# Patient Record
Sex: Female | Born: 1945 | Race: White | Hispanic: No | Smoking: Never smoker
Health system: Southern US, Community
[De-identification: ages and names within clinical notes are randomized; demographics above are authoritative.]

## PROBLEM LIST (undated history)

## (undated) DIAGNOSIS — K219 Gastro-esophageal reflux disease without esophagitis: Secondary | ICD-10-CM

## (undated) DIAGNOSIS — F319 Bipolar disorder, unspecified: Secondary | ICD-10-CM

## (undated) DIAGNOSIS — I10 Essential (primary) hypertension: Secondary | ICD-10-CM

## (undated) DIAGNOSIS — M549 Dorsalgia, unspecified: Secondary | ICD-10-CM

## (undated) DIAGNOSIS — E039 Hypothyroidism, unspecified: Secondary | ICD-10-CM

## (undated) DIAGNOSIS — T8859XA Other complications of anesthesia, initial encounter: Secondary | ICD-10-CM

## (undated) DIAGNOSIS — E785 Hyperlipidemia, unspecified: Secondary | ICD-10-CM

## (undated) DIAGNOSIS — I7121 Aneurysm of the ascending aorta, without rupture: Secondary | ICD-10-CM

## (undated) DIAGNOSIS — E079 Disorder of thyroid, unspecified: Secondary | ICD-10-CM

## (undated) DIAGNOSIS — M1990 Unspecified osteoarthritis, unspecified site: Secondary | ICD-10-CM

## (undated) DIAGNOSIS — T4145XA Adverse effect of unspecified anesthetic, initial encounter: Secondary | ICD-10-CM

## (undated) DIAGNOSIS — F329 Major depressive disorder, single episode, unspecified: Secondary | ICD-10-CM

## (undated) DIAGNOSIS — Z9981 Dependence on supplemental oxygen: Secondary | ICD-10-CM

## (undated) DIAGNOSIS — F32A Depression, unspecified: Secondary | ICD-10-CM

## (undated) DIAGNOSIS — R51 Headache: Secondary | ICD-10-CM

## (undated) HISTORY — PX: NASAL SINUS SURGERY: SHX719

## (undated) HISTORY — PX: EYE SURGERY: SHX253

## (undated) HISTORY — PX: APPENDECTOMY: SHX54

## (undated) HISTORY — PX: GASTRIC BYPASS: SHX52

## (undated) HISTORY — PX: BACK SURGERY: SHX140

## (undated) HISTORY — DX: Aneurysm of the ascending aorta, without rupture: I71.21

## (undated) HISTORY — PX: TONSILLECTOMY: SUR1361

## (undated) HISTORY — PX: WRIST SURGERY: SHX841

---

## 1999-02-06 LAB — CONVERTED CEMR LAB

## 2000-03-12 ENCOUNTER — Ambulatory Visit (HOSPITAL_BASED_OUTPATIENT_CLINIC_OR_DEPARTMENT_OTHER): Admission: RE | Admit: 2000-03-12 | Discharge: 2000-03-13 | Payer: Self-pay | Admitting: Orthopedic Surgery

## 2000-10-22 ENCOUNTER — Ambulatory Visit (HOSPITAL_BASED_OUTPATIENT_CLINIC_OR_DEPARTMENT_OTHER): Admission: RE | Admit: 2000-10-22 | Discharge: 2000-10-22 | Payer: Self-pay | Admitting: Orthopedic Surgery

## 2000-10-23 ENCOUNTER — Emergency Department (HOSPITAL_COMMUNITY): Admission: EM | Admit: 2000-10-23 | Discharge: 2000-10-23 | Payer: Self-pay | Admitting: Emergency Medicine

## 2005-07-30 ENCOUNTER — Emergency Department (HOSPITAL_COMMUNITY): Admission: EM | Admit: 2005-07-30 | Discharge: 2005-07-30 | Payer: Self-pay | Admitting: Emergency Medicine

## 2005-08-04 ENCOUNTER — Emergency Department (HOSPITAL_COMMUNITY): Admission: EM | Admit: 2005-08-04 | Discharge: 2005-08-04 | Payer: Self-pay | Admitting: Advanced Practice Midwife

## 2005-08-19 ENCOUNTER — Emergency Department (HOSPITAL_COMMUNITY): Admission: EM | Admit: 2005-08-19 | Discharge: 2005-08-19 | Payer: Self-pay | Admitting: Emergency Medicine

## 2005-08-21 ENCOUNTER — Ambulatory Visit: Payer: Self-pay | Admitting: *Deleted

## 2005-08-21 ENCOUNTER — Inpatient Hospital Stay (HOSPITAL_COMMUNITY): Admission: EM | Admit: 2005-08-21 | Discharge: 2005-08-27 | Payer: Self-pay | Admitting: *Deleted

## 2005-08-23 ENCOUNTER — Encounter: Payer: Self-pay | Admitting: *Deleted

## 2005-09-26 ENCOUNTER — Emergency Department (HOSPITAL_COMMUNITY): Admission: EM | Admit: 2005-09-26 | Discharge: 2005-09-26 | Payer: Self-pay | Admitting: Emergency Medicine

## 2005-10-11 ENCOUNTER — Emergency Department (HOSPITAL_COMMUNITY): Admission: EM | Admit: 2005-10-11 | Discharge: 2005-10-11 | Payer: Self-pay | Admitting: Emergency Medicine

## 2005-10-11 ENCOUNTER — Encounter: Payer: Self-pay | Admitting: Vascular Surgery

## 2007-01-01 ENCOUNTER — Emergency Department (HOSPITAL_COMMUNITY): Admission: EM | Admit: 2007-01-01 | Discharge: 2007-01-02 | Payer: Self-pay | Admitting: Emergency Medicine

## 2007-04-08 ENCOUNTER — Emergency Department (HOSPITAL_COMMUNITY): Admission: EM | Admit: 2007-04-08 | Discharge: 2007-04-08 | Payer: Self-pay | Admitting: Emergency Medicine

## 2007-05-12 ENCOUNTER — Ambulatory Visit: Payer: Self-pay | Admitting: Nurse Practitioner

## 2007-05-12 DIAGNOSIS — F319 Bipolar disorder, unspecified: Secondary | ICD-10-CM | POA: Insufficient documentation

## 2007-05-12 DIAGNOSIS — M545 Low back pain, unspecified: Secondary | ICD-10-CM | POA: Insufficient documentation

## 2007-05-12 DIAGNOSIS — I10 Essential (primary) hypertension: Secondary | ICD-10-CM | POA: Insufficient documentation

## 2007-05-12 LAB — CONVERTED CEMR LAB
ALT: 16 units/L (ref 0–35)
AST: 23 units/L (ref 0–37)
Albumin: 4.4 g/dL (ref 3.5–5.2)
Alkaline Phosphatase: 120 units/L — ABNORMAL HIGH (ref 39–117)
BUN: 23 mg/dL (ref 6–23)
Basophils Absolute: 0 10*3/uL (ref 0.0–0.1)
Basophils Relative: 0 % (ref 0–1)
CO2: 27 meq/L (ref 19–32)
Calcium: 9.6 mg/dL (ref 8.4–10.5)
Chloride: 98 meq/L (ref 96–112)
Cholesterol: 249 mg/dL — ABNORMAL HIGH (ref 0–200)
Creatinine, Ser: 1.05 mg/dL (ref 0.40–1.20)
Eosinophils Absolute: 0.1 10*3/uL (ref 0.0–0.7)
Eosinophils Relative: 1 % (ref 0–5)
Glucose, Bld: 101 mg/dL — ABNORMAL HIGH (ref 70–99)
HCT: 44.2 % (ref 36.0–46.0)
HDL: 42 mg/dL (ref >39)
Hemoglobin: 14.1 g/dL (ref 12.0–15.0)
LDL Cholesterol: 180 mg/dL — ABNORMAL HIGH (ref 0–99)
Lymphocytes Relative: 28 % (ref 12–46)
Lymphs Abs: 2.3 10*3/uL (ref 0.7–4.0)
MCHC: 31.9 g/dL (ref 30.0–36.0)
MCV: 94.8 fL (ref 78.0–100.0)
Monocytes Absolute: 0.7 10*3/uL (ref 0.1–1.0)
Monocytes Relative: 9 % (ref 3–12)
Neutro Abs: 5.1 10*3/uL (ref 1.7–7.7)
Neutrophils Relative %: 62 % (ref 43–77)
Platelets: 272 10*3/uL (ref 150–400)
Potassium: 4.2 meq/L (ref 3.5–5.3)
RBC: 4.66 M/uL (ref 3.87–5.11)
RDW: 12.8 % (ref 11.5–15.5)
Sodium: 139 meq/L (ref 135–145)
TSH: 5.179 microintl units/mL (ref 0.350–5.50)
Total Bilirubin: 0.3 mg/dL (ref 0.3–1.2)
Total CHOL/HDL Ratio: 5.9
Total Protein: 7.1 g/dL (ref 6.0–8.3)
Triglycerides: 137 mg/dL (ref <150)
VLDL: 27 mg/dL (ref 0–40)
WBC: 8.3 10*3/uL (ref 4.0–10.5)

## 2007-05-13 ENCOUNTER — Encounter (INDEPENDENT_AMBULATORY_CARE_PROVIDER_SITE_OTHER): Payer: Self-pay | Admitting: Nurse Practitioner

## 2007-05-13 DIAGNOSIS — E7800 Pure hypercholesterolemia, unspecified: Secondary | ICD-10-CM | POA: Insufficient documentation

## 2007-05-28 ENCOUNTER — Telehealth (INDEPENDENT_AMBULATORY_CARE_PROVIDER_SITE_OTHER): Payer: Self-pay | Admitting: Nurse Practitioner

## 2007-06-04 ENCOUNTER — Telehealth (INDEPENDENT_AMBULATORY_CARE_PROVIDER_SITE_OTHER): Payer: Self-pay | Admitting: *Deleted

## 2007-07-02 ENCOUNTER — Encounter: Admission: RE | Admit: 2007-07-02 | Discharge: 2007-07-02 | Payer: Self-pay | Admitting: Family Medicine

## 2007-07-18 ENCOUNTER — Ambulatory Visit: Payer: Self-pay | Admitting: Nurse Practitioner

## 2007-07-18 DIAGNOSIS — E669 Obesity, unspecified: Secondary | ICD-10-CM | POA: Insufficient documentation

## 2007-08-18 ENCOUNTER — Encounter (INDEPENDENT_AMBULATORY_CARE_PROVIDER_SITE_OTHER): Payer: Self-pay | Admitting: Nurse Practitioner

## 2007-08-18 ENCOUNTER — Telehealth (INDEPENDENT_AMBULATORY_CARE_PROVIDER_SITE_OTHER): Payer: Self-pay | Admitting: Nurse Practitioner

## 2007-09-17 ENCOUNTER — Ambulatory Visit: Payer: Self-pay | Admitting: Nurse Practitioner

## 2007-09-25 ENCOUNTER — Ambulatory Visit: Payer: Self-pay | Admitting: Nurse Practitioner

## 2007-09-25 LAB — CONVERTED CEMR LAB
Cholesterol: 189 mg/dL (ref 0–200)
HDL: 47 mg/dL (ref >39)
LDL Cholesterol: 99 mg/dL (ref 0–99)
Total CHOL/HDL Ratio: 4
Triglycerides: 215 mg/dL — ABNORMAL HIGH (ref <150)
VLDL: 43 mg/dL — ABNORMAL HIGH (ref 0–40)

## 2007-09-29 ENCOUNTER — Encounter (INDEPENDENT_AMBULATORY_CARE_PROVIDER_SITE_OTHER): Payer: Self-pay | Admitting: Nurse Practitioner

## 2007-10-24 ENCOUNTER — Emergency Department (HOSPITAL_COMMUNITY): Admission: EM | Admit: 2007-10-24 | Discharge: 2007-10-24 | Payer: Self-pay | Admitting: Emergency Medicine

## 2007-11-07 ENCOUNTER — Encounter (INDEPENDENT_AMBULATORY_CARE_PROVIDER_SITE_OTHER): Payer: Self-pay | Admitting: Nurse Practitioner

## 2007-11-12 ENCOUNTER — Ambulatory Visit: Payer: Self-pay | Admitting: Nurse Practitioner

## 2007-11-12 DIAGNOSIS — L259 Unspecified contact dermatitis, unspecified cause: Secondary | ICD-10-CM | POA: Insufficient documentation

## 2007-11-18 ENCOUNTER — Encounter (INDEPENDENT_AMBULATORY_CARE_PROVIDER_SITE_OTHER): Payer: Self-pay | Admitting: Nurse Practitioner

## 2007-11-20 ENCOUNTER — Ambulatory Visit: Payer: Self-pay | Admitting: Surgery

## 2007-11-20 ENCOUNTER — Ambulatory Visit: Admission: RE | Admit: 2007-11-20 | Discharge: 2007-11-20 | Payer: Self-pay | Admitting: Family Medicine

## 2007-11-20 ENCOUNTER — Encounter (INDEPENDENT_AMBULATORY_CARE_PROVIDER_SITE_OTHER): Payer: Self-pay | Admitting: Ophthalmology

## 2007-11-26 ENCOUNTER — Telehealth (INDEPENDENT_AMBULATORY_CARE_PROVIDER_SITE_OTHER): Payer: Self-pay | Admitting: Nurse Practitioner

## 2008-01-20 ENCOUNTER — Inpatient Hospital Stay (HOSPITAL_COMMUNITY): Admission: RE | Admit: 2008-01-20 | Discharge: 2008-01-26 | Payer: Self-pay | Admitting: Neurosurgery

## 2008-03-09 ENCOUNTER — Encounter: Admission: RE | Admit: 2008-03-09 | Discharge: 2008-04-14 | Payer: Self-pay | Admitting: Neurosurgery

## 2008-05-20 ENCOUNTER — Ambulatory Visit: Payer: Self-pay | Admitting: Nurse Practitioner

## 2008-05-20 DIAGNOSIS — L679 Hair color and hair shaft abnormality, unspecified: Secondary | ICD-10-CM | POA: Insufficient documentation

## 2008-05-20 DIAGNOSIS — J309 Allergic rhinitis, unspecified: Secondary | ICD-10-CM | POA: Insufficient documentation

## 2008-05-20 DIAGNOSIS — L739 Follicular disorder, unspecified: Secondary | ICD-10-CM

## 2008-05-20 LAB — CONVERTED CEMR LAB
ALT: 12 units/L (ref 0–35)
AST: 19 units/L (ref 0–37)
Albumin: 4.2 g/dL (ref 3.5–5.2)
Alkaline Phosphatase: 114 units/L (ref 39–117)
BUN: 19 mg/dL (ref 6–23)
Basophils Absolute: 0 10*3/uL (ref 0.0–0.1)
Basophils Relative: 0 % (ref 0–1)
CO2: 27 meq/L (ref 19–32)
Calcium: 9.2 mg/dL (ref 8.4–10.5)
Chloride: 102 meq/L (ref 96–112)
Cholesterol, target level: 200 mg/dL
Cholesterol: 209 mg/dL — ABNORMAL HIGH (ref 0–200)
Creatinine, Ser: 0.84 mg/dL (ref 0.40–1.20)
Eosinophils Absolute: 0.1 10*3/uL (ref 0.0–0.7)
Eosinophils Relative: 1 % (ref 0–5)
Glucose, Bld: 90 mg/dL (ref 70–99)
HCT: 40.9 % (ref 36.0–46.0)
HDL goal, serum: 40 mg/dL
HDL: 45 mg/dL (ref >39)
Hemoglobin: 12.9 g/dL (ref 12.0–15.0)
LDL Cholesterol: 132 mg/dL — ABNORMAL HIGH (ref 0–99)
LDL Goal: 160 mg/dL
Lymphocytes Relative: 21 % (ref 12–46)
Lymphs Abs: 1.6 10*3/uL (ref 0.7–4.0)
MCHC: 31.5 g/dL (ref 30.0–36.0)
MCV: 92.5 fL (ref 78.0–100.0)
Monocytes Absolute: 0.6 10*3/uL (ref 0.1–1.0)
Monocytes Relative: 8 % (ref 3–12)
Neutro Abs: 5.1 10*3/uL (ref 1.7–7.7)
Neutrophils Relative %: 70 % (ref 43–77)
Platelets: 243 10*3/uL (ref 150–400)
Potassium: 4.5 meq/L (ref 3.5–5.3)
RBC: 4.42 M/uL (ref 3.87–5.11)
RDW: 16 % — ABNORMAL HIGH (ref 11.5–15.5)
Sodium: 142 meq/L (ref 135–145)
Total Bilirubin: 0.4 mg/dL (ref 0.3–1.2)
Total CHOL/HDL Ratio: 4.6
Total Protein: 6.8 g/dL (ref 6.0–8.3)
Triglycerides: 159 mg/dL — ABNORMAL HIGH (ref <150)
VLDL: 32 mg/dL (ref 0–40)
WBC: 7.4 10*3/uL (ref 4.0–10.5)

## 2008-05-21 ENCOUNTER — Encounter (INDEPENDENT_AMBULATORY_CARE_PROVIDER_SITE_OTHER): Payer: Self-pay | Admitting: Nurse Practitioner

## 2008-05-21 ENCOUNTER — Telehealth (INDEPENDENT_AMBULATORY_CARE_PROVIDER_SITE_OTHER): Payer: Self-pay | Admitting: Nurse Practitioner

## 2008-05-25 ENCOUNTER — Telehealth (INDEPENDENT_AMBULATORY_CARE_PROVIDER_SITE_OTHER): Payer: Self-pay | Admitting: Nurse Practitioner

## 2008-07-28 ENCOUNTER — Ambulatory Visit: Payer: Self-pay | Admitting: Nurse Practitioner

## 2008-07-29 ENCOUNTER — Encounter (INDEPENDENT_AMBULATORY_CARE_PROVIDER_SITE_OTHER): Payer: Self-pay | Admitting: Nurse Practitioner

## 2008-07-29 LAB — CONVERTED CEMR LAB
Basophils Absolute: 0 10*3/uL (ref 0.0–0.1)
Basophils Relative: 0 % (ref 0–1)
Eosinophils Absolute: 0.1 10*3/uL (ref 0.0–0.7)
Eosinophils Relative: 1 % (ref 0–5)
HCT: 43.8 % (ref 36.0–46.0)
Hemoglobin: 13.8 g/dL (ref 12.0–15.0)
Lymphocytes Relative: 22 % (ref 12–46)
Lymphs Abs: 2.5 10*3/uL (ref 0.7–4.0)
MCHC: 31.5 g/dL (ref 30.0–36.0)
MCV: 96.1 fL (ref 78.0–100.0)
Monocytes Absolute: 1.2 10*3/uL — ABNORMAL HIGH (ref 0.1–1.0)
Monocytes Relative: 10 % (ref 3–12)
Neutro Abs: 7.8 10*3/uL — ABNORMAL HIGH (ref 1.7–7.7)
Neutrophils Relative %: 67 % (ref 43–77)
Platelets: 301 10*3/uL (ref 150–400)
RBC: 4.56 M/uL (ref 3.87–5.11)
RDW: 14.8 % (ref 11.5–15.5)
Sed Rate: 6 mm/hr (ref 0–22)
WBC: 11.6 10*3/uL — ABNORMAL HIGH (ref 4.0–10.5)

## 2008-09-13 ENCOUNTER — Ambulatory Visit: Payer: Self-pay | Admitting: Nurse Practitioner

## 2008-09-13 DIAGNOSIS — K219 Gastro-esophageal reflux disease without esophagitis: Secondary | ICD-10-CM | POA: Insufficient documentation

## 2008-09-14 ENCOUNTER — Encounter (INDEPENDENT_AMBULATORY_CARE_PROVIDER_SITE_OTHER): Payer: Self-pay | Admitting: Nurse Practitioner

## 2008-09-14 LAB — CONVERTED CEMR LAB: TSH: 6.265 microintl units/mL — ABNORMAL HIGH (ref 0.350–4.500)

## 2008-09-27 ENCOUNTER — Ambulatory Visit: Payer: Self-pay | Admitting: Nurse Practitioner

## 2008-10-04 ENCOUNTER — Ambulatory Visit: Payer: Self-pay | Admitting: Nurse Practitioner

## 2008-10-25 ENCOUNTER — Ambulatory Visit: Payer: Self-pay | Admitting: Nurse Practitioner

## 2008-10-26 LAB — CONVERTED CEMR LAB: TSH: 7.376 microintl units/mL — ABNORMAL HIGH (ref 0.350–4.500)

## 2008-12-06 ENCOUNTER — Ambulatory Visit: Payer: Self-pay | Admitting: Nurse Practitioner

## 2008-12-06 DIAGNOSIS — T148XXA Other injury of unspecified body region, initial encounter: Secondary | ICD-10-CM | POA: Insufficient documentation

## 2008-12-06 LAB — CONVERTED CEMR LAB
Cholesterol, target level: 200 mg/dL
HDL goal, serum: 40 mg/dL
LDL Goal: 130 mg/dL

## 2008-12-07 ENCOUNTER — Encounter (INDEPENDENT_AMBULATORY_CARE_PROVIDER_SITE_OTHER): Payer: Self-pay | Admitting: Nurse Practitioner

## 2008-12-13 ENCOUNTER — Ambulatory Visit: Payer: Self-pay | Admitting: Nurse Practitioner

## 2008-12-13 DIAGNOSIS — H6690 Otitis media, unspecified, unspecified ear: Secondary | ICD-10-CM | POA: Insufficient documentation

## 2008-12-17 ENCOUNTER — Ambulatory Visit (HOSPITAL_COMMUNITY): Admission: RE | Admit: 2008-12-17 | Discharge: 2008-12-17 | Payer: Self-pay | Admitting: Internal Medicine

## 2008-12-22 ENCOUNTER — Ambulatory Visit: Payer: Self-pay | Admitting: Nurse Practitioner

## 2008-12-22 LAB — CONVERTED CEMR LAB
HCT: 48.2 % — ABNORMAL HIGH (ref 36.0–46.0)
Hemoglobin: 14.7 g/dL (ref 12.0–15.0)
MCHC: 30.5 g/dL (ref 30.0–36.0)
MCV: 103.7 fL — ABNORMAL HIGH (ref 78.0–100.0)
Platelets: 234 10*3/uL (ref 150–400)
RBC: 4.65 M/uL (ref 3.87–5.11)
RDW: 14 % (ref 11.5–15.5)
Retic Ct Pct: 1 % (ref 0.4–3.1)
WBC: 7.3 10*3/uL (ref 4.0–10.5)

## 2008-12-28 ENCOUNTER — Encounter (INDEPENDENT_AMBULATORY_CARE_PROVIDER_SITE_OTHER): Payer: Self-pay | Admitting: Nurse Practitioner

## 2009-01-21 ENCOUNTER — Telehealth (INDEPENDENT_AMBULATORY_CARE_PROVIDER_SITE_OTHER): Payer: Self-pay | Admitting: Nurse Practitioner

## 2009-03-11 ENCOUNTER — Ambulatory Visit: Payer: Self-pay | Admitting: Nurse Practitioner

## 2009-03-11 DIAGNOSIS — G43909 Migraine, unspecified, not intractable, without status migrainosus: Secondary | ICD-10-CM | POA: Insufficient documentation

## 2009-03-17 ENCOUNTER — Ambulatory Visit (HOSPITAL_COMMUNITY): Admission: RE | Admit: 2009-03-17 | Discharge: 2009-03-17 | Payer: Self-pay | Admitting: Internal Medicine

## 2009-03-18 ENCOUNTER — Telehealth (INDEPENDENT_AMBULATORY_CARE_PROVIDER_SITE_OTHER): Payer: Self-pay | Admitting: Nurse Practitioner

## 2009-03-28 ENCOUNTER — Telehealth (INDEPENDENT_AMBULATORY_CARE_PROVIDER_SITE_OTHER): Payer: Self-pay | Admitting: Nurse Practitioner

## 2009-03-29 ENCOUNTER — Ambulatory Visit: Payer: Self-pay | Admitting: Nurse Practitioner

## 2009-03-29 DIAGNOSIS — R059 Cough, unspecified: Secondary | ICD-10-CM | POA: Insufficient documentation

## 2009-03-29 DIAGNOSIS — R05 Cough: Secondary | ICD-10-CM | POA: Insufficient documentation

## 2009-03-29 LAB — CONVERTED CEMR LAB: Pro B Natriuretic peptide (BNP): 10.1 pg/mL (ref 0.0–100.0)

## 2009-03-31 ENCOUNTER — Ambulatory Visit (HOSPITAL_COMMUNITY): Admission: RE | Admit: 2009-03-31 | Discharge: 2009-03-31 | Payer: Self-pay | Admitting: Nurse Practitioner

## 2009-04-04 ENCOUNTER — Ambulatory Visit: Payer: Self-pay | Admitting: Nurse Practitioner

## 2009-05-02 ENCOUNTER — Ambulatory Visit: Payer: Self-pay | Admitting: Nurse Practitioner

## 2009-05-17 ENCOUNTER — Telehealth (INDEPENDENT_AMBULATORY_CARE_PROVIDER_SITE_OTHER): Payer: Self-pay | Admitting: Nurse Practitioner

## 2009-05-19 ENCOUNTER — Encounter (INDEPENDENT_AMBULATORY_CARE_PROVIDER_SITE_OTHER): Payer: Self-pay | Admitting: Nurse Practitioner

## 2009-07-05 ENCOUNTER — Ambulatory Visit: Payer: Self-pay | Admitting: Nurse Practitioner

## 2009-07-05 DIAGNOSIS — M25511 Pain in right shoulder: Secondary | ICD-10-CM | POA: Insufficient documentation

## 2009-08-03 ENCOUNTER — Telehealth (INDEPENDENT_AMBULATORY_CARE_PROVIDER_SITE_OTHER): Payer: Self-pay | Admitting: Nurse Practitioner

## 2009-08-05 ENCOUNTER — Telehealth (INDEPENDENT_AMBULATORY_CARE_PROVIDER_SITE_OTHER): Payer: Self-pay | Admitting: Nurse Practitioner

## 2009-09-23 ENCOUNTER — Other Ambulatory Visit: Admission: RE | Admit: 2009-09-23 | Discharge: 2009-09-23 | Payer: Self-pay | Admitting: Internal Medicine

## 2009-09-23 ENCOUNTER — Ambulatory Visit: Payer: Self-pay | Admitting: Nurse Practitioner

## 2009-09-23 DIAGNOSIS — L989 Disorder of the skin and subcutaneous tissue, unspecified: Secondary | ICD-10-CM | POA: Insufficient documentation

## 2009-09-25 ENCOUNTER — Encounter (INDEPENDENT_AMBULATORY_CARE_PROVIDER_SITE_OTHER): Payer: Self-pay | Admitting: Nurse Practitioner

## 2009-10-13 ENCOUNTER — Telehealth (INDEPENDENT_AMBULATORY_CARE_PROVIDER_SITE_OTHER): Payer: Self-pay | Admitting: Nurse Practitioner

## 2009-10-24 ENCOUNTER — Ambulatory Visit: Payer: Self-pay | Admitting: Nurse Practitioner

## 2009-10-31 ENCOUNTER — Encounter (INDEPENDENT_AMBULATORY_CARE_PROVIDER_SITE_OTHER): Payer: Self-pay | Admitting: Nurse Practitioner

## 2009-10-31 DIAGNOSIS — K5730 Diverticulosis of large intestine without perforation or abscess without bleeding: Secondary | ICD-10-CM | POA: Insufficient documentation

## 2009-11-12 ENCOUNTER — Emergency Department (HOSPITAL_COMMUNITY): Admission: EM | Admit: 2009-11-12 | Discharge: 2009-11-12 | Payer: Self-pay | Admitting: Emergency Medicine

## 2009-11-14 ENCOUNTER — Encounter (INDEPENDENT_AMBULATORY_CARE_PROVIDER_SITE_OTHER): Payer: Self-pay | Admitting: Nurse Practitioner

## 2009-11-22 ENCOUNTER — Ambulatory Visit: Payer: Self-pay | Admitting: Nurse Practitioner

## 2009-11-22 LAB — CONVERTED CEMR LAB
Cholesterol: 196 mg/dL (ref 0–200)
HDL: 45 mg/dL (ref >39)
LDL Cholesterol: 120 mg/dL — ABNORMAL HIGH (ref 0–99)
Total CHOL/HDL Ratio: 4.4
Triglycerides: 156 mg/dL — ABNORMAL HIGH (ref <150)
VLDL: 31 mg/dL (ref 0–40)

## 2009-11-23 ENCOUNTER — Encounter (INDEPENDENT_AMBULATORY_CARE_PROVIDER_SITE_OTHER): Payer: Self-pay | Admitting: Nurse Practitioner

## 2009-12-08 ENCOUNTER — Telehealth (INDEPENDENT_AMBULATORY_CARE_PROVIDER_SITE_OTHER): Payer: Self-pay | Admitting: *Deleted

## 2009-12-12 ENCOUNTER — Encounter (INDEPENDENT_AMBULATORY_CARE_PROVIDER_SITE_OTHER): Payer: Self-pay | Admitting: Nurse Practitioner

## 2009-12-13 ENCOUNTER — Ambulatory Visit (HOSPITAL_COMMUNITY)
Admission: RE | Admit: 2009-12-13 | Discharge: 2009-12-14 | Payer: Self-pay | Source: Home / Self Care | Admitting: Ophthalmology

## 2009-12-26 ENCOUNTER — Ambulatory Visit: Payer: Self-pay | Admitting: Nurse Practitioner

## 2010-01-02 ENCOUNTER — Telehealth (INDEPENDENT_AMBULATORY_CARE_PROVIDER_SITE_OTHER): Payer: Self-pay | Admitting: Nurse Practitioner

## 2010-01-05 ENCOUNTER — Ambulatory Visit (HOSPITAL_COMMUNITY)
Admission: RE | Admit: 2010-01-05 | Discharge: 2010-01-05 | Payer: Self-pay | Source: Home / Self Care | Admitting: Internal Medicine

## 2010-02-26 ENCOUNTER — Encounter: Payer: Self-pay | Admitting: Family Medicine

## 2010-03-01 ENCOUNTER — Ambulatory Visit: Admit: 2010-03-01 | Payer: Self-pay | Admitting: Nurse Practitioner

## 2010-03-05 LAB — CONVERTED CEMR LAB
ALT: 8 units/L (ref 0–35)
AST: 15 units/L (ref 0–37)
Albumin: 4 g/dL (ref 3.5–5.2)
Alkaline Phosphatase: 86 units/L (ref 39–117)
BUN: 14 mg/dL (ref 6–23)
Bilirubin Urine: NEGATIVE
CO2: 29 meq/L (ref 19–32)
Calcium: 9.2 mg/dL (ref 8.4–10.5)
Chloride: 100 meq/L (ref 96–112)
Cholesterol: 230 mg/dL — ABNORMAL HIGH (ref 0–200)
Creatinine, Ser: 1.1 mg/dL (ref 0.40–1.20)
Glucose, Bld: 82 mg/dL (ref 70–99)
Glucose, Urine, Semiquant: NEGATIVE
HDL: 49 mg/dL (ref >39)
KOH Prep: NEGATIVE
Ketones, urine, test strip: NEGATIVE
LDL Cholesterol: 155 mg/dL — ABNORMAL HIGH (ref 0–99)
Microalb, Ur: 0.84 mg/dL (ref 0.00–1.89)
Nitrite: NEGATIVE
Pap Smear: NEGATIVE
Potassium: 4.6 meq/L (ref 3.5–5.3)
Protein, U semiquant: NEGATIVE
Sodium: 141 meq/L (ref 135–145)
Specific Gravity, Urine: 1.02
Total Bilirubin: 0.4 mg/dL (ref 0.3–1.2)
Total CHOL/HDL Ratio: 4.7
Total Protein: 6.3 g/dL (ref 6.0–8.3)
Triglycerides: 129 mg/dL (ref <150)
Urobilinogen, UA: 0.2
VLDL: 26 mg/dL (ref 0–40)
pH: 5

## 2010-03-09 NOTE — Assessment & Plan Note (Signed)
Summary: BP NEXT WEEK//GK  Nurse Visit per [facility] said pt bp looks good and to keep appts that she has for lab in 'sept and return every three months............. Armenia Shannon  October 04, 2008 11:55 AM   Vital Signs:  Patient profile:   65 year old female Pulse rate:   94 / minute BP sitting:   112 / 74  (left arm) Cuff size:   large  Vitals Entered By: Armenia Shannon (October 04, 2008 11:48 AM)  Allergies: 1)  ! Duricef 2)  ! Neurontin  Orders Added: 1)  Est. Patient Level I [16109] Prescriptions: LISINOPRIL 10 MG TABS (LISINOPRIL) one tab by mouth daily for blood pressure  #30 x 5   Entered by:   Armenia Shannon   Authorized by:   Lehman Prom FNP   Signed by:   Armenia Shannon on 10/04/2008   Method used:   Print then Give to Patient   RxID:   [id number]

## 2010-03-09 NOTE — Medication Information (Signed)
Summary: [facility] //[facility] FAX FORM  [facility] //[facility] FAX FORM   Imported By: Arta Bruce 12/26/2009 14:32:12  _____________________________________________________________________  External Attachment:    Type:   Image     Comment:   External Document

## 2010-03-09 NOTE — Letter (Signed)
Summary: [facility] PROCEDURE REPORT  [facility] PROCEDURE REPORT   Imported By: Arta Bruce 11/22/2009 12:33:33  _____________________________________________________________________  External Attachment:    Type:   Image     Comment:   External Document

## 2010-03-09 NOTE — Letter (Signed)
Summary: TEST ORDER FORM/MAMMOGRAM  TEST ORDER FORM/MAMMOGRAM   Imported By: Arta Bruce 12/27/2009 15:55:27  _____________________________________________________________________  External Attachment:    Type:   Image     Comment:   External Document

## 2010-03-09 NOTE — Letter (Signed)
Summary: REFERRAL/DEMATOLOGY//APPT DATE & TIME  REFERRAL/DEMATOLOGY//APPT DATE & TIME   Imported By: Arta Bruce 09/23/2009 11:50:04  _____________________________________________________________________  External Attachment:    Type:   Image     Comment:   External Document

## 2010-03-09 NOTE — Assessment & Plan Note (Signed)
Summary: Complete Physical Exam   Vital Signs:  Patient profile:   65 year old female Menstrual status:  postmenopausal Weight:      266.0 pounds BMI:     46.55 Temp:     97.2 degrees F oral Pulse rate:   80 / minute Pulse rhythm:   regular Resp:     20 per minute BP sitting:   122 / 80  (left arm) Cuff size:   large  Vitals Entered By: Levon Hedger (September 23, 2009 9:31 AM)  Nutrition Counseling: Patient's BMI is greater than 25 and therefore counseled on weight management options. CC: CPP....pain in neck arms and knees...knot in right side of chest x 1 month, Lipid Management, Hypertension Management, Headache Pain Assessment Patient in pain? yes     Location: neck, knees  Does patient need assistance? Functional Status Self care Ambulation Normal   CC:  CPP....pain in neck arms and knees...knot in right side of chest x 1 month, Lipid Management, Hypertension Management, and Headache.  History of Present Illness:  Pt into the office for a complete physical exam.  PAP - last pap several years ago.   Post menopausal  - pt is not sexually active no family history of cervical cancer  Mammogram - done last 12/2008.  No family history of breast cancer  Optho - last eye exam 2 years ago.  She has an appt scheduled after 09/30/2009 because she had to wait 2 years.  Dental - Appt scheduled next week  Colonsopy - pt has never had a colonscopy. no family history of colon cancer  Diabetes Management History:      She says that she is exercising.  Type of exercise includes: walking.  Duration of exercise is estimated to be <30  She is doing this 3 times per week.    Headache HPI:      The headaches will last anywhere from 30 minutes to several days at a time.  She has approximately 5+ headaches per month.        The headaches are not associated with an aura.  The location of the headaches are bilateral.  Aggravating factors include head movement.        Additional  history: improved.    Headache Treatment History:      Ergots were tried but not effective.    Hypertension History:      She denies headache, chest pain, and palpitations.  She notes no problems with any antihypertensive medication side effects.  pt is taking meds as ordered.        Positive major cardiovascular risk factors include female age 60 years old or older, hyperlipidemia, and hypertension.  Negative major cardiovascular risk factors include no history of diabetes and non-tobacco-user status.        Further assessment for target organ damage reveals no history of ASHD, cardiac end-organ damage (CHF/LVH), stroke/TIA, peripheral vascular disease, renal insufficiency, or hypertensive retinopathy.    Lipid Management History:      Positive NCEP/ATP III risk factors include female age 60 years old or older and hypertension.  Negative NCEP/ATP III risk factors include no history of early menopause without estrogen hormone replacement, non-diabetic, non-tobacco-user status, no ASHD (atherosclerotic heart disease), no prior stroke/TIA, no peripheral vascular disease, and no history of aortic aneurysm.        The patient states that she knows about the "Therapeutic Lifestyle Change" diet.  Her compliance with the TLC diet is fair.  Adjunctive measures started by  the patient include weight reduction.  She expresses no side effects from her lipid-lowering medication.  The patient denies any symptoms to suggest myopathy or liver disease.       Habits & Providers  Alcohol-Tobacco-Diet     Alcohol drinks/day: <1     Alcohol type: wine     Tobacco Status: never  Exercise-Depression-Behavior     Does Patient Exercise: yes     Exercise Counseling: to improve exercise regimen     Type of exercise: walking     Exercise (avg: min/session): <30     Times/week: 3     Depression Counseling: not indicated; screening negative for depression     Drug Use: no     Seat Belt Use: 100     Sun Exposure:  occasionally  Comments: PHQ-9 = 13  Allergies (verified): 1)  ! Duricef 2)  ! Neurontin  Review of Systems General:  Obesity - weight is making her more and more depressed.  She does not want to get out and do things.  "I want to live again". Eyes:  Denies blurring. ENT:  Complains of earache; recurrent. CV:  Denies chest pain or discomfort. Resp:  Complains of chest discomfort; right chest - area of concern; present x 1 month- tender but no discoloration of overlying skin. GI:  Complains of constipation and diarrhea; intermittent diarrhea and constipation. GU:  Denies discharge. MS:  Complains of joint pain. Derm:  Complains of lesion(s); multiple crusted, erythematous lesions to skin that is on her face, arms and legs.  recurrent. She has used cream steriod and antibiotic cream as per this provider which improves some but then areas return.. Neuro:  Complains of headaches. Psych:  Denies anxiety and depression.  Physical Exam  General:  alert.  obese Head:  normocephalic.   Eyes:  vision grossly intact, pupils equal, and pupils round.   Ears:  bil erythema R>L Nose:  no nasal discharge.   Mouth:  pharynx pink and moist.  upper partial  Neck:  supple.   Chest Wall:  right chest - 1.5 cm nodule, tender - overlying skin intact Breasts:  skin/areolae normal, no masses, and no abnormal thickening.   Lungs:  normal breath sounds.   Heart:  normal rate and regular rhythm.   Abdomen:  soft, non-tender, and no distention.   Rectal:  external hemorrhoid(s).   Msk:  up to the exam table Extremities:  no edema Neurologic:  alert & oriented X3.   slow gait Psych:  Oriented X3.    Pelvic Exam  Vulva:      normal appearance.   Urethra and Bladder:      Urethra--normal.   Vagina:      post-menopausal.   Cervix:      midposition.   Uterus:      non-tender.   Adnexa:      nontender bilaterally.   Rectum:      heme negative stool, + external hemorrhoids.     Diabetic Foot  Exam Pulse Check          Right Foot          Left Foot Dorsalis Pedis:        normal            normal Comments: left great toenail - brittle, discoloration   Impression & Recommendations:  Problem # 1:  ROUTINE GYNECOLOGICAL EXAMINATION (ICD-V72.31) labs done   PAP done  guaiac negative pt to keep upcoming optho and dental appt  tdap up to date will refer pt for colonscopy Orders: Hemoccult Guaiac-1 spec.(in office) (82270) KOH/ WET Mount [phone]) Pap Smear, Thin Prep ( Collection of) (X9147)  Problem # 2:  HYPERTENSION, BENIGN ESSENTIAL (ICD-401.1)  Her updated medication list for this problem includes:    Lisinopril 10 Mg Tabs (Lisinopril) ..... One tab by mouth daily for blood pressure  Orders: UA Dipstick w/o Micro (manual) (82956) T-Urine Microalbumin w/creat. ratio [phone]) T-Comprehensive Metabolic Panel ([id number]) EKG w/ Interpretation (93000)  Problem # 3:  OBESITY (ICD-278.00) pt would like to consider lap band will read handouts and review with pt on next visit  Problem # 4:  MASS, CHEST WALL (ICD-786.6) of concern - will advise pt to apply warm compresses and if still present will need diagnostic test  Problem # 5:  SCREENING, COLON CANCER (ICD-V76.51) will refer Orders: Colonoscopy (Colon)  Problem # 6:  SKIN LESION (ICD-709.9) will refer to derm Orders: Dermatology Referral (Derma)  Complete Medication List: 1)  Citalopram Hydrobromide 20 Mg Tabs (Citalopram hydrobromide) .... One tablet by mouth daily **rx by greninger** 2)  Lamictal 200 Mg Tabs (Lamotrigine) .... Take one tablet daily *greninger,np 3)  Trazodone Hcl 100 Mg Tabs (Trazodone hcl) .... Take one tablet at bedtime as needed *greninger, np 4)  Levocetirizine Dihydrochloride 5 Mg Tabs (Levocetirizine dihydrochloride) .... One tablet by mouth daily for allergies 5)  Omeprazole 20 Mg Cpdr (Omeprazole) .... One capsule by mouth two times a day before breakfast and dinner 6)   Lisinopril 10 Mg Tabs (Lisinopril) .... One tab by mouth daily for blood pressure 7)  Levothyroxine Sodium 50 Mcg Tabs (Levothyroxine sodium) .... One tablet by mouth daily for thyroid 8)  Vicodin 5-500 Mg Tabs (Hydrocodone-acetaminophen) .... One tablet by mouth daily as needed for back 9)  Lac-hydrin 12 % Lotn (Ammonium lactate) .... One application topically two times a day as needed to affected area 10)  Sumatriptan Succinate 100 Mg Tabs (Sumatriptan succinate) .... One tablet by mouth at onset of headache, may repeat in 2 hours if headache persists 11)  Fluticasone Propionate 50 Mcg/act Susp (Fluticasone propionate) .... One spray in each nosril two times a day **hold head down** pharmacy - discontiue nasacort 12)  Mupirocin 2 % Oint (Mupirocin) .... Use to affected area two times a day until healed  Other Orders: T-Lipid Profile ([id number])  Diabetes Management Assessment/Plan:      The following lipid goals have been established for the patient: Total cholesterol goal of 200; LDL cholesterol goal of 130; HDL cholesterol goal of 40; Triglyceride goal of 150.  Her blood pressure goal is < 140/90.    Hypertension Assessment/Plan:      The patient's hypertensive risk group is category B: At least one risk factor (excluding diabetes) with no target organ damage.  Her calculated 10 year risk of coronary heart disease is 11 %.  Today's blood pressure is 122/80.  Her blood pressure goal is < 140/90.  Lipid Assessment/Plan:      Based on NCEP/ATP III, the patient's risk factor category is "2 or more risk factors and a calculated 10 year CAD risk of < 20%".  The patient's lipid goals are as follows: Total cholesterol goal is 200; LDL cholesterol goal is 130; HDL cholesterol goal is 40; Triglyceride goal is 150.    Patient Instructions: 1)  You will be referred for a colonscopy 2)  You will be referred to dermatology for skin lesions 3)  I will read literature and have an  update at your next  visit. 4)  Right chest - This is an area of concern.  Apply warm compresses to the area 5)  Allergies - prescription given for new medications 6)  Follow up in 2 weeks with n.martin to check area on chest. Prescriptions: SUMATRIPTAN SUCCINATE 100 MG TABS (SUMATRIPTAN SUCCINATE) One tablet by mouth at onset of headache, may repeat in 2 hours if headache persists  #10 x 0   Entered and Authorized by:   Lehman Prom FNP   Signed by:   Lehman Prom FNP on 09/23/2009   Method used:   Print then Give to Patient   RxID:   [id number] VICODIN 5-500 MG TABS (HYDROCODONE-ACETAMINOPHEN) One tablet by mouth daily as needed for back  #30 x 0   Entered and Authorized by:   Lehman Prom FNP   Signed by:   Lehman Prom FNP on 09/23/2009   Method used:   Print then Give to Patient   RxID:   [id number] LEVOCETIRIZINE DIHYDROCHLORIDE 5 MG TABS (LEVOCETIRIZINE DIHYDROCHLORIDE) One tablet by mouth daily for allergies  #30 x 5   Entered and Authorized by:   Lehman Prom FNP   Signed by:   Lehman Prom FNP on 09/23/2009   Method used:   Print then Give to Patient   RxID:   [id number]   Laboratory Results   Urine Tests  Date/Time Received: September 23, 2009 9:31 AM   Routine Urinalysis   Color: lt. yellow Appearance: Clear Glucose: negative   (Normal Range: Negative) Bilirubin: negative   (Normal Range: Negative) Ketone: negative   (Normal Range: Negative) Spec. Gravity: 1.020   (Normal Range: 1.003-1.035) Blood: trace-lysed   (Normal Range: Negative) pH: 5.0   (Normal Range: 5.0-8.0) Protein: negative   (Normal Range: Negative) Urobilinogen: 0.2   (Normal Range: 0-1) Nitrite: negative   (Normal Range: Negative) Leukocyte Esterace: small   (Normal Range: Negative)    Date/Time Received: September 23, 2009 11:11 AM   Wet Mount/KOH Source: vaginal WBC/hpf: 1-5 Bacteria/hpf: rare Clue cells/hpf: none Yeast/hpf: none Trichomonas/hpf: none  Stool - Occult  Blood Hemmoccult #1: negative Date: 09/23/2009    Laboratory Results   Urine Tests    Routine Urinalysis   Color: lt. yellow Appearance: Clear Glucose: negative   (Normal Range: Negative) Bilirubin: negative   (Normal Range: Negative) Ketone: negative   (Normal Range: Negative) Spec. Gravity: 1.020   (Normal Range: 1.003-1.035) Blood: trace-lysed   (Normal Range: Negative) pH: 5.0   (Normal Range: 5.0-8.0) Protein: negative   (Normal Range: Negative) Urobilinogen: 0.2   (Normal Range: 0-1) Nitrite: negative   (Normal Range: Negative) Leukocyte Esterace: small   (Normal Range: Negative)      Wet Mount Wet Mount KOH: Negative

## 2010-03-09 NOTE — Miscellaneous (Signed)
Summary: Dx added  Clinical Lists Changes Will discuss labs with pt at f/u visit. Problems: Added new problem of HYPERCHOLESTEROLEMIA (ICD-272.0) Orders: Added new Test order of MRI (MRI) - Signed needed to revise MRI order to indicate that study can be done without contrast

## 2010-03-09 NOTE — Progress Notes (Signed)
Summary: EYE SURGERY/ MEDICAL CLERANCE  Phone Note Call from Patient Call back at Home Phone [phone]   Summary of Call: Lindsey Rowe IS HAVING EYE SURGERY NEX TUESDAY, AND THE DR NEEDS A LETTER STATING THAT HER HEALTH IS OK AND A COPY OF THE EKG SHE HAD HERE, AND A PAPER FOR YOU TO FILLOUT.  Initial call taken by: Leodis Rains,  December 08, 2009 3:38 PM  Follow-up for Phone Call        Lindsey Rowe CAME BY TODAY TO DROPOFF THE MEDICAL CLEARANCE SHEET, BECAUSE HER EYE SURGERY IS ON TUESDAY 110/08. Follow-up by: Leodis Rains,  December 09, 2009 2:12 PM  Additional Follow-up for Phone Call Additional follow up Details #1::        form completed. Fax back along with printed chart material notify pt when items have been sent back. OK to proceed with surgery Additional Follow-up by: Lehman Prom FNP,  December 12, 2009 8:15 AM    Additional Follow-up for Phone Call Additional follow up Details #2::    LEFT MESSAGE Follow-up by: Arta Bruce,  December 12, 2009 9:32 AM

## 2010-03-09 NOTE — Letter (Signed)
Summary: Lipid Letter  [facility]  [address] [place], [place] [address]   Phone: [phone]  Fax: [phone]    09/29/2007  Laneisha Mino [address] [place], [place]  [address]  Dear [name]:  We have carefully reviewed your last lipid profile from 09/25/2007 and the results are noted below with a summary of recommendations for lipid management.    Cholesterol:       189     Goal: less than 200  Previoius: 249   HDL "good" Cholesterol:   47     Goal: more than 40  Previous  42   LDL "bad" Cholesterol:   99     Goal: less than 100  Previous 180   Triglycerides:       215     Goal: less than 150  Previous 137  See above for cholesterol values during recent office visit and also your previous values.  All improved except of triglycerides.  Continue pravachol 40mg  by mouth nightly as ordered.  Continue to modify diet to avoid fried and fatty foods.    Adjunctive Measures (may lower LIPIDS and reduce risk of Heart Attack) include: Aerobic Exercise (20-30 minutes 3-4 times a week) Limit Alcohol Consumption Weight Reduction Aspirin 75-81 mg a day by mouth (if not allergic or contraindicated)      Current Medications: 1)    Fluoxetine Hcl 20 Mg  Caps (Fluoxetine hcl) .... Take one capsule daily *greninger 2)    Lamictal 200 Mg Tabs (Lamotrigine) .... Take one tablet daily *greninger,np 3)    Hydroxyzine Hcl 25 Mg  Caps (hydroxyzine Hcl)  .... Take 1 or 2 capsules at bedtime as needed *greninger,np 4)    Trazodone Hcl 100 Mg  Tabs (Trazodone hcl) .... Take one tablet at bedtime as needed *greninger, np 5)    Piroxicam 20 Mg  Caps (Piroxicam) .[name].. 1 tablet by mouth daily 6)    Flexeril 10 Mg  Tabs (Cyclobenzaprine hcl) .[name].. 1 tablet by mouth nightly as needed for spasms 7)    Darvocet-n 100 100-650 Mg  Tabs (Propoxyphene n-apap) .[name].. 1 tablet by mouth two times a day as needed for pain 8)    Pravachol 40 Mg  Tabs (Pravastatin sodium) .[name].. 1 tablet by mouth  at night for cholesterol  If you have any questions, please call. We appreciate being able to work with you.   Sincerely,    [facility] Lehman Prom FNP

## 2010-03-09 NOTE — Progress Notes (Signed)
Summary: Query:  Refill Mupirocin?  Phone Note Outgoing Call   Summary of Call: Do you want to refill her mupirocin?  Last ordered 5/11. Initial call taken by: Dutch Quint RN,  October 13, 2009 12:57 PM  Follow-up for Phone Call        Can't tell what this was for. Defer to PCP. Tereso Newcomer PA-C  October 13, 2009 2:01 PM   Additional Follow-up for Phone Call Additional follow up Details #1::        yes, ok to refill looks like to rescheduled her dermatology appt to 11/14/2009 Additional Follow-up by: Lehman Prom FNP,  October 17, 2009 2:06 PM    Additional Follow-up for Phone Call Additional follow up Details #2::    Noted and refilled.  Dutch Quint RN  October 17, 2009 4:09 PM

## 2010-03-09 NOTE — Progress Notes (Signed)
Summary: Question about a medication  Phone Note Call from Patient Call back at [phone]   Reason for Call: Lab or Test Results Summary of Call: The pt called because it seems that [facility] ([address]) do not carry neither manufactured her allergy medication.  Please call her back. [facility] FnP Initial call taken by: Manon Hilding,  August 05, 2009 9:00 AM  Follow-up for Phone Call        left message on machine for pt to return call to the office. Levon Hedger  August 12, 2009 2:22 PM  Levon Hedger  August 16, 2009 5:22 PM spoke with pt and she needs a new Rx for a different medication because they have d/c the allergy medication by the manufacturer.  Rx can be sent to [facility] [address]. She says that she has been out of medication for 1 month and a half.  Additional Follow-up for Phone Call Additional follow up Details #1::        Aggie Cosier - please call [facility] pharmacy - i spoke with pharmacist last week who told me that he was going to order another ??Lot of Fexofenodine that would be in by the end of the week and the new numbers on that medication should be accepted by medicaid.  Pt has been approved by Medicaid to get fexofenodine (prior authorization previously done) so I'm not sure what the problem is.  Pt has transportation restrictions so is unable to go to another pharmacy.  If all else fails [facility] should be able to get the fexofenodine that is covered from another [facility] so pt can go to [address] to pick up Additional Follow-up by: Lehman Prom FNP,  August 17, 2009 8:06 AM    Additional Follow-up for Phone Call Additional follow up Details #2::    Per the pharmacist at [address] the only shipment they are receiving of fexofenidine is the "Equate" brand, which is not covered.  The other [facility] may have stock of the approved med available, but they are not able to transfer stock from one store to another.  Dutch Quint RN  August 17, 2009 3:54 PM  Give pt the above information and  let her decide what to do n.martin,fnp  August 17, 2009  5:02 PM  Left message on answering machine to return call.  Dutch Quint RN  August 18, 2009 9:27 AM  Will wait to discuss with provider during visit on 08/22/09.   Follow-up by: Dutch Quint RN,  August 18, 2009 2:31 PM  Additional Follow-up for Phone Call Additional follow up Details #3:: Details for Additional Follow-up Action Taken: noted Additional Follow-up by: Lehman Prom FNP,  August 18, 2009 2:52 PM

## 2010-03-09 NOTE — Letter (Signed)
Summary: [facility]   Imported By: Arta Bruce 11/11/2007 15:05:47  _____________________________________________________________________  External Attachment:    Type:   Image     Comment:   External Document

## 2010-03-09 NOTE — Letter (Signed)
Summary: Lipid Letter  [facility]  [address] [place], [place] [address]   Phone: [phone]  Fax: [phone]    09/25/2009  Lindsey Rowe [address] [place], [place]  [address]  Dear Ms. Lindsey Rowe:  We have carefully reviewed your last lipid profile from 09/23/2009 and the results are noted below with a summary of recommendations for lipid management.    Cholesterol:       230     Goal: less than 200   HDL "good" Cholesterol:   49     Goal: greater than 40   LDL "bad" Cholesterol:   155     Goal: less than 130   Triglycerides:       129     Goal: less than 150    Labs done during recent office visit show that your cholesterol is high.  You should have been contacted by this office regarding the need to start medication. You will need fasting labs repeated in 8 weeks after starting medications.  Pap Smear results __________________________.     Current Medications: 1)    Citalopram Hydrobromide 20 Mg Tabs (Citalopram hydrobromide) .... One tablet by mouth daily **rx by greninger** 2)    Lamictal 200 Mg Tabs (Lamotrigine) .... Take one tablet daily *greninger,np 3)    Trazodone Hcl 100 Mg  Tabs (Trazodone hcl) .... Take one tablet at bedtime as needed *greninger, np 4)    Levocetirizine Dihydrochloride 5 Mg Tabs (Levocetirizine dihydrochloride) .... One tablet by mouth daily for allergies 5)    Omeprazole 20 Mg Cpdr (Omeprazole) .... One capsule by mouth two times a day before breakfast and dinner 6)    Lisinopril 10 Mg Tabs (Lisinopril) .... One tab by mouth daily for blood pressure 7)    Levothyroxine Sodium 50 Mcg Tabs (Levothyroxine sodium) .... One tablet by mouth daily for thyroid 8)    Vicodin 5-500 Mg Tabs (Hydrocodone-acetaminophen) .... One tablet by mouth daily as needed for back 9)    Lac-hydrin 12 % Lotn (Ammonium lactate) .... One application topically two times a day as needed to affected area 10)    Sumatriptan Succinate 100 Mg Tabs  (Sumatriptan succinate) .... One tablet by mouth at onset of headache, may repeat in 2 hours if headache persists 11)    Fluticasone Propionate 50 Mcg/act Susp (Fluticasone propionate) .... One spray in each nosril two times a day **hold head down** pharmacy - discontiue nasacort 12)    Mupirocin 2 % Oint (Mupirocin) .... Use to affected area two times a day until healed 13)    Pravastatin Sodium 20 Mg Tabs (Pravastatin sodium) .... One tablet by mouth nightly for cholesterol     If you have any questions, please call. We appreciate being able to work with you.   Sincerely,    [facility] Lehman Prom FNP

## 2010-03-09 NOTE — Progress Notes (Signed)
Summary: Office Visit//DEPRESSION SCREENING  Office Visit//DEPRESSION SCREENING   Imported By: Arta Bruce 09/23/2009 11:36:47  _____________________________________________________________________  External Attachment:    Type:   Image     Comment:   External Document

## 2010-03-09 NOTE — Progress Notes (Signed)
Summary: NDC # not covering medication  Phone Note From Pharmacy   Summary of Call: Please call pharmacy.There is a question about the FEXOFENADINE(Spelling?). Initial call taken by: Janace Aris,  August 03, 2009 2:14 PM  Follow-up for Phone Call        spoke with Sameer at [facility] he says that the [facility] # is not covered by insurance so they can not give pt the medication. Follow-up by: Levon Hedger,  August 03, 2009 4:59 PM  Additional Follow-up for Phone Call Additional follow up Details #1::        spoke with pharmacy and they will order and different brand of the medication and it will be available on Friday.  Insurance should cover the other brand Notify pt to check back with pharmacy on friday Additional Follow-up by: Lehman Prom FNP,  August 03, 2009 5:17 PM    Additional Follow-up for Phone Call Additional follow up Details #2::    pt informed. Follow-up by: Levon Hedger,  August 04, 2009 10:42 AM

## 2010-03-09 NOTE — Progress Notes (Signed)
Summary: Head Ct results  Phone Note Outgoing Call   Summary of Call: Notify pt that head ct is normal. She can start medication as ordered during last visit, both antibiotic and prednisone. She can cancel visit on next week and rescheduled to the following week by that time i can see what response she had from the medication Initial call taken by: Lehman Prom FNP,  March 18, 2009 8:05 AM  Follow-up for Phone Call        pt informed Follow-up by: Levon Hedger,  March 18, 2009 2:31 PM

## 2010-03-09 NOTE — Progress Notes (Signed)
Summary: MEDICATION CLARIFICATION  Phone Note Call from Patient Call back at [facility] Phone [phone]   Summary of Call: PT STATES THATSHE  WAS GIVEN A RX FOR SHAMPOO ON HER VISIT AND PHARMACY STATES THEY WERE UNAWARE WHAT THIS IS AND THEY HAD NEVER HEARD OF THIS.([facility]  [phone] ADVISED PT WILL FORWARD TO PROVIDER AND CONTACT BACK. Initial call taken by: Mikey College CMA,  May 21, 2008 4:46 PM  Follow-up for Phone Call        New Rx sent to the pharmacy for nizoral shampoo. advise pt to recheck with the pharmacy Follow-up by: Lehman Prom FNP,  May 24, 2008 8:14 AM  Additional Follow-up for Phone Call Additional follow up Details #1::        pt informed.  Additional Follow-up by: Levon Hedger,  May 24, 2008 4:53 PM    New/Updated Medications: NIZORAL 2 % SHAM (KETOCONAZOLE) One application to scalp daily, let sit for 5 minutes then rinse. Pat hair dry.   Prescriptions: NIZORAL 2 % SHAM (KETOCONAZOLE) One application to scalp daily, let sit for 5 minutes then rinse. Pat hair dry.  #121ml x 1   Entered and Authorized by:   Lehman Prom FNP   Signed by:   Lehman Prom FNP on 05/24/2008   Method used:   Electronically to        [facility]* (retail)       [address]       [place], [place]  [address]       Ph: [phone]       Fax: [phone]   RxID:   [id number]

## 2010-03-09 NOTE — Miscellaneous (Signed)
Summary: Colonscopy results  Clinical Lists Changes  Problems: Added new problem of DIVERTICULOSIS, COLON (ICD-562.10) - noted on colonscopy Observations: Added new observation of COLONNXTDUE: 11/2014 (11/14/2009 19:01) Added new observation of COLONRECACT: Repeat colonoscopy in 5 years.  (10/31/2009 19:01) Added new observation of COLONOSCOPY:  Results: Diverticulosis.        (10/31/2009 19:01)      Colonoscopy  Procedure date:  10/31/2009  Findings:       Results: Diverticulosis.         Comments:      Repeat colonoscopy in 5 years.   Procedures Next Due Date:    Colonoscopy: 11/2014   Colonoscopy  Procedure date:  10/31/2009  Findings:       Results: Diverticulosis.         Comments:      Repeat colonoscopy in 5 years.   Procedures Next Due Date:    Colonoscopy: 11/2014

## 2010-03-09 NOTE — Letter (Signed)
Summary: PATIENT INFORMATION & HISTORY SHEET  PATIENT INFORMATION & HISTORY SHEET   Imported ByArta Bruce 05/13/2007 15:32:51  _____________________________________________________________________  External Attachment:    Type:   Image     Comment:   External Document

## 2010-03-09 NOTE — Assessment & Plan Note (Signed)
Summary: Headache   Vital Signs:  Patient profile:   65 year old female Menstrual status:  postmenopausal Height:      63.5 inches Weight:      263 pounds BMI:     46.02 Temp:     98.0 degrees F oral Pulse rate:   88 / minute Pulse rhythm:   regular Resp:     18 per minute BP sitting:   123 / 78  (left arm) Cuff size:   large  Vitals Entered By: Levon Hedger (April 04, 2009 3:47 PM) CC: f/u on results on ct of head...., Headache Is Patient Diabetic? No Pain Assessment Patient in pain? no       Does patient need assistance? Functional Status Self care Ambulation Normal   CC:  f/u on results on ct of head.... and Headache.  History of Present Illness:  Pt into the office for follow up. CXRAY done since her last visit. Negative results BNP negative done on last visit  Cough - pt has finished 2 rounds of antibiotics and was prescribed mucinex on last visit which she has started taking. some improvement in cough      Headache HPI:      The patient comes in for chronic management of headaches which have been unstable.  Since the last visit, the frequency of headaches have not changed, and the intensity of the headaches have not changed.  The headaches will last anywhere from 30 minutes to several days at a time.  She has approximately 5+ headaches per month.        The headaches are not associated with an aura.  The location of the headaches are bilateral.  Aggravating factors include head movement.        Positive alarm features include change in frequency from prior H/A's.        Additional history: Headache has continued over the past 2 weeks despite antibiotics, prednisone, nasal spray, decongestants.  Today pt reports a remote hx of migraines but not affected in many years.     Current Medications (verified): 1)  Lexapro 10 Mg Tabs (Escitalopram Oxalate) .... One Tablet By Mouth Daily ** Rx By  [facility]** 2)  Lamictal 200 Mg Tabs (Lamotrigine) ....  Take One Tablet Daily *greninger,np 3)  Trazodone Hcl 100 Mg  Tabs (Trazodone Hcl) .... Take One Tablet At Bedtime As Needed *greninger, Np 4)  Flexeril 10 Mg  Tabs (Cyclobenzaprine Hcl) .[name].. 1 Tablet By Mouth Nightly As Needed For Spasms 5)  Fexofenadine Hcl 180 Mg Tabs (Fexofenadine Hcl) .... One Tablet By Mouth Daily For Allergies **pt Has Tried and Failed Loratadine and Zyrtec** 6)  Omeprazole 20 Mg Cpdr (Omeprazole) .... One Capsule By Mouth Two Times A Day Before Breakfast and Dinner 7)  Lisinopril 10 Mg Tabs (Lisinopril) .... One Tab By Mouth Daily For Blood Pressure 8)  Levothyroxine Sodium 50 Mcg Tabs (Levothyroxine Sodium) .... One Tablet By Mouth Daily For Thyroid 9)  Vicodin 5-500 Mg Tabs (Hydrocodone-Acetaminophen) .... One Tablet By Mouth Daily As Needed For Back 10)  Nasacort Aq 55 Mcg/act Aers (Triamcinolone Acetonide(Nasal)) .... One Spray in Each Nostril Two Times A Day  **pharmacy - Discontinue The Fluticasone** 11)  Meclizine Hcl 25 Mg Tabs (Meclizine Hcl) .... One Tablet By Mouth Daily As Needed For Dizziness 12)  Lac-Hydrin 12 % Lotn (Ammonium Lactate) .... One Application Topically Two Times A Day As Needed To Affected Area 13)  Mucinex Dm 30-600 Mg Xr12h-Tab (Dextromethorphan-Guaifenesin) .... One Tablet  By Mouth Two Times A Day As Needed For Cough and Congestion 14)  Flovent Diskus 100 Mcg/blist Aepb (Fluticasone Propionate (Inhal)) .... One Puff Twice Daily  Allergies (verified): 1)  ! Duricef 2)  ! Neurontin  Review of Systems General:  Complains of loss of appetite. Eyes:  Denies blurring. ENT:  Complains of earache. Resp:  Complains of cough; improving. Neuro:  Complains of headaches.  Physical Exam  General:  alert.   Head:  normocephalic.   Ears:  bil TM with clear fluid - bony landmarks present Lungs:  normal breath sounds.   Heart:  normal rate and regular rhythm.   Neurologic:  alert & oriented X3.   Psych:  Oriented X3.     Impression &  Recommendations:  Problem # 1:  COUGH (ICD-786.2) CXRAY negative pt to continue mucinex  Problem # 2:  HEADACHE (ICD-784.0) quite possibly that pt could be having a migraine will order triptan Her updated medication list for this problem includes:    Vicodin 5-500 Mg Tabs (Hydrocodone-acetaminophen) ..... One tablet by mouth daily as needed for back    Sumatriptan Succinate 100 Mg Tabs (Sumatriptan succinate) ..... One tablet by mouth at onset of headache, may repeat in 2 hours if headache persists  Problem # 3:  ALLERGIC RHINITIS (ICD-477.9)  Her updated medication list for this problem includes:    Fexofenadine Hcl 180 Mg Tabs (Fexofenadine hcl) ..... One tablet by mouth daily for allergies **pt has tried and failed loratadine and zyrtec**    Nasacort Aq 55 Mcg/act Aers (Triamcinolone acetonide(nasal)) ..... One spray in each nostril two times a day  **pharmacy - discontinue the fluticasone**  Complete Medication List: 1)  Lexapro 10 Mg Tabs (Escitalopram oxalate) .... One tablet by mouth daily ** rx by [facility]** 2)  Lamictal 200 Mg Tabs (Lamotrigine) .... Take one tablet daily *greninger,np 3)  Trazodone Hcl 100 Mg Tabs (Trazodone hcl) .... Take one tablet at bedtime as needed *greninger, np 4)  Flexeril 10 Mg Tabs (Cyclobenzaprine hcl) .[name].. 1 tablet by mouth nightly as needed for spasms 5)  Fexofenadine Hcl 180 Mg Tabs (Fexofenadine hcl) .... One tablet by mouth daily for allergies **pt has tried and failed loratadine and zyrtec** 6)  Omeprazole 20 Mg Cpdr (Omeprazole) .... One capsule by mouth two times a day before breakfast and dinner 7)  Lisinopril 10 Mg Tabs (Lisinopril) .... One tab by mouth daily for blood pressure 8)  Levothyroxine Sodium 50 Mcg Tabs (Levothyroxine sodium) .... One tablet by mouth daily for thyroid 9)  Vicodin 5-500 Mg Tabs (Hydrocodone-acetaminophen) .... One tablet by mouth daily as needed for back 10)  Nasacort Aq 55 Mcg/act Aers (Triamcinolone  acetonide(nasal)) .... One spray in each nostril two times a day  **pharmacy - discontinue the fluticasone** 11)  Meclizine Hcl 25 Mg Tabs (Meclizine hcl) .... One tablet by mouth daily as needed for dizziness 12)  Lac-hydrin 12 % Lotn (Ammonium lactate) .... One application topically two times a day as needed to affected area 13)  Mucinex Dm 30-600 Mg Xr12h-tab (Dextromethorphan-guaifenesin) .... One tablet by mouth two times a day as needed for cough and congestion 14)  Flovent Diskus 100 Mcg/blist Aepb (Fluticasone propionate (inhal)) .... One puff twice daily 15)  Sumatriptan Succinate 100 Mg Tabs (Sumatriptan succinate) .... One tablet by mouth at onset of headache, may repeat in 2 hours if headache persists 16)  Antipyrine-benzocaine 5.4-1.4 % Soln (Benzocaine-antipyrine) .... 2 drops in each ear two times a day for  pain  Patient Instructions: 1)  Continue on the mucinex for your cough. 2)  You will need to get your carpet cleaned and this may be provoking your cough and sinus problems. 3)  Headache - you have been treated several times for headache 4)  May be due to migraine - try imitrex - take 1 tablet by mouth at onset of headache.  May repeat in 2 hours is headache continues. 5)  Only 10 pills per month 6)  Follow up in 4 weeks with n.martin,fnp for headache or sooner if necessary Prescriptions: ANTIPYRINE-BENZOCAINE 5.4-1.4 % SOLN (BENZOCAINE-ANTIPYRINE) 2 drops in each ear two times a day for pain  #73ml x 0   Entered and Authorized by:   Lehman Prom FNP   Signed by:   Lehman Prom FNP on 04/04/2009   Method used:   Electronically to        [facility] [phone]* (retail)       [address]       [place], [place]  [address]       Ph: [phone]       Fax: [phone]   RxID:   [id number] SUMATRIPTAN SUCCINATE 100 MG TABS (SUMATRIPTAN SUCCINATE) One tablet by mouth at onset of headache, may repeat in 2 hours if headache persists  #10 x 0   Entered and  Authorized by:   Lehman Prom FNP   Signed by:   Lehman Prom FNP on 04/04/2009   Method used:   Electronically to        [facility] [phone]* (retail)       [address]       [place], [place]  [address]       Ph: [phone]       Fax: [phone]   RxID:   [id number]   Appended Document: Headache Pt does live in a carpeted apartment. New carpet placed about 2 years ago when she moved in however no recent cleaning. Filters in vents are changed twice per year reports pt

## 2010-03-09 NOTE — Letter (Signed)
Summary: *HSN Results Follow up  [facility]  [address] [place], [place] [address]   Phone: [phone]  Fax: [phone]      09/14/2008   Lindsey Rowe [address] [place], [place]  [address]   Dear  [name],                            ____S.Drinkard,FNP   ____D. Gore,FNP       ____B. McPherson,MD   ____V. Rankins,MD    ____E. Mulberry,MD    __X__N. Daphine Deutscher, FNP  ____D. Reche Dixon, MD    ____K. Philipp Deputy, MD    ____Other     This letter is to inform you that your recent test(s):  _______Pap Smear    ___X____Lab Test     _______X-ray    _______ is within acceptable limits  ___X____ requires a medication change  _______ requires a follow-up lab visit  _______ requires a follow-up visit with your provider   Comments: Labs done during your recent office visit show that you are HYPOthyroid.  You are not getting enough thyroid hormone.  You will need to start levothyroxine and this medication was sent to your pharmacy.  You will need to schedule a lab visit to get your thyroid lab rechecked 6-8 weeks after starting the medication.    _________________________________________________________ If you have any questions, please contact our office [phone].                    Sincerely,    Lehman Prom FNP [facility]

## 2010-03-09 NOTE — Assessment & Plan Note (Signed)
Summary: Right Otitis Media   Vital Signs:  Patient profile:   65 year old female Menstrual status:  postmenopausal Height:      63.5 inches Weight:      265 pounds Temp:     97.6 degrees F oral Pulse rate:   96 / minute Pulse rhythm:   regular Resp:     21 per minute BP sitting:   114 / 79  (left arm) Cuff size:   large  Vitals Entered By: Arthor Captain 12/13/2008 CC: Lower back pain, dizziness and nausea Is Patient Diabetic? No Pain Assessment Patient in pain? yes     Location: lower back Intensity: 8 Type: Throbbing, Aching Onset of pain  Constant  Does patient need assistance? Functional Status Self care Ambulation Normal Comments patient tried walking this morning and had increased pain.  Also concerned that she has an inner ear infection because of her dizziness.     Menstrual Status postmenopausal Last PAP Result per pt   CC:  Lower back pain and dizziness and nausea.  History of Present Illness:  Pt into the office for f/u on fall. Still some soreness but active ROM in all extremities.   Dizziness - started on Tuesday night (after her visit in this office)  and still present today "I feel drunk" Dizziness mostly when changing positions from seated to standing. It has decreased in frequency Only short term dizziness (1 day at most) in the past but never for several days -vomiting +nausea -headache Uses flonase but provides only about 1 hour or relief versus the several hours she is used to Pt does not drive at [place]. She rides the bus Using a cane today and has been since her last visit here.  Back - ongoing pain for the past week. She has back pain at baseline.   Allergies (verified): 1)  ! Duricef 2)  ! Neurontin  Review of Systems General:  Denies fever; dizziness for the past 6 days. ENT:  Complains of earache; denies nasal congestion and sore throat; left ear. CV:  Denies fatigue. Resp:  Denies cough. GI:  Complains of nausea; denies  vomiting. MS:  Complains of low back pain. Neuro:  Denies headaches.  Physical Exam  General:  alert.  obese Ears:  right TM with erythema Left Tm with fluid present Mouth:  poor dentition.   Lungs:  normal breath sounds.   Heart:  normal rate and regular rhythm.   Abdomen:  normal bowel sounds.   Neurologic:  cane use Skin:  face with multiple crusted papular lesions Psych:  Oriented X3.  Oriented X3.     Impression & Recommendations:  Problem # 1:  OTITIS MEDIA, RIGHT (ICD-382.9) maybe the cause of dizziness  will treat with antibiotics and meclizine as needed  Her updated medication list for this problem includes:    Amoxicillin 500 Mg Caps (Amoxicillin) ..... 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 1 tablet by mouth nightly as needed for spasms    Darvocet-n 100 100-650 Mg Tabs (Propoxyphene n-apap) ..... One tablet by mouth two times a day as needed for back  Her updated medication list  for this problem includes:    Flexeril 10 Mg Tabs (Cyclobenzaprine hcl) .[name]... 1 tablet by mouth nightly as needed for spasms    Darvocet-n 100 100-650 Mg Tabs (Propoxyphene n-apap) ..... One tablet by mouth two times a day as needed for back  Problem # 3:  HYPERTENSION, BENIGN ESSENTIAL (ICD-401.1) stable  Her updated medication list for this problem includes:    Lisinopril 10 Mg Tabs (Lisinopril) ..... One tab by mouth daily for blood pressure  Complete Medication List: 1)  Lexapro 10 Mg Tabs (Escitalopram oxalate) .... One tablet by mouth daily ** rx by [facility]** 2)  Lamictal 200 Mg Tabs (Lamotrigine)  .... Take one tablet daily *greninger,np 3)  Trazodone Hcl 100 Mg Tabs (Trazodone hcl) .... Take one tablet at bedtime as needed *greninger, np 4)  Flexeril 10 Mg Tabs (Cyclobenzaprine hcl) .[name].. 1 tablet by mouth nightly as needed for spasms 5)  Loratadine 10 Mg Tabs (Loratadine) .... One tablet by mouth daily for allergies 6)  Omeprazole 20 Mg Cpdr (Omeprazole) .... One capsule by mouth two times a day before breakfast and dinner 7)  Lisinopril 10 Mg Tabs (Lisinopril) .... One tab by mouth daily for blood pressure 8)  Levothyroxine Sodium 50 Mcg Tabs (Levothyroxine sodium) .... One tablet by mouth daily for thyroid **note change in dose** 9)  Darvocet-n 100 100-650 Mg Tabs (Propoxyphene n-apap) .... One tablet by mouth two times a day as needed for back 10)  Fluticasone Propionate 50 Mcg/act Susp (Fluticasone propionate) .... One spray in each nostril two times a day 11)  Amoxicillin 500 Mg Caps (Amoxicillin) .... One capsule by mouth three times a day for infection 12)  Meclizine Hcl 25 Mg Tabs (Meclizine hcl) .... One tablet by mouth daily as needed for dizziness 13)  Lac-hydrin 12 % Lotn (Ammonium lactate) .... One application topically two times a day as needed to affected area  Patient Instructions: 1)  Follow up at regular scheduled visit for labs - tsh, lft's, pt/INR, anemia profile, vitamin K. 2)  Take the medications for infection three times a day for 10 days. 3)  This should help with the dizziness. 4)  You can also take meclizine 25mg  by mouth as needed for dizziness Prescriptions: LAC-HYDRIN 12 % LOTN (AMMONIUM LACTATE) One application topically two times a day as needed to affected area  #146ml x 1   Entered and Authorized by:   Lehman Prom FNP   Signed by:   Lehman Prom FNP on 12/13/2008   Method used:   Electronically to        [facility]* (retail)       [address]       [place], [place]  [id number]       Ph: [phone]       Fax:  [phone]   RxID:   [id number] MECLIZINE HCL 25 MG TABS (MECLIZINE HCL) One tablet by mouth daily as needed for dizziness  #30 x 0   Entered and Authorized by:   Lehman Prom FNP   Signed by:   Lehman Prom FNP on 12/13/2008   Method used:   Electronically to        [facility] (retail)       [address]       [place], [place]  [id number]       Ph: [phone]       Fax: [phone]   RxID:   [id number] AMOXICILLIN 500  MG CAPS (AMOXICILLIN) One capsule by mouth three times a day for infection  #30 x 0   Entered and Authorized by:   Lehman Prom FNP   Signed by:   Lehman Prom FNP on 12/13/2008   Method used:   Electronically to        [facility] (retail)       [address]       [place], [place]  [id number]       Ph: [phone]       Fax: [phone]   RxID:   [id number]    Medication Administration  Injection # 1:    Medication: Depo- Medrol 80mg     Diagnosis: OTITIS MEDIA, RIGHT (ICD-382.9)    Route: IM    Site: LUOQ gluteus    Exp Date: 09/2009    Lot #: [id number]    Mfr: Pharmacia    Comments: UVO  [id number]    Patient tolerated injection without complications    Given by: Levon Hedger (December 13, 2008 11:50 AM)  Injection # 2:    Medication: Ketorolac-Toradol 15mg     Diagnosis: OTITIS MEDIA, RIGHT (ICD-382.9)    Route: IM    Site: RUOQ gluteus    Exp Date: 01/2010    Lot #: [id number]    Mfr: [email or web address]    Comments: ndc  [phone]    Patient tolerated injection without complications    Given by: Levon Hedger (December 13, 2008 11:51 AM)  Orders Added: 1)  Est. Patient Level III [18841] 2)  Admin of Therapeutic Inj  intramuscular or subcutaneous [96372] 3)  Ketorolac-Toradol 15mg  [J1885] 4)  Depo- Medrol 80mg  [J1040]

## 2010-03-09 NOTE — Progress Notes (Signed)
Summary: REFILL ON FEXOFENADINE  Phone Note Call from Patient Call back at Home Phone [phone]   Reason for Call: Refill Medication Summary of Call: Lindsey Rowe [name] CALLED BECAUSE SHE NEEDS A REFILL ON FEXOFENADINE AND SHE WAS GETTING IT AT [facility] BUT THEY NO LONGER SELLS IT AND SHE CAN GET IT A [facility] [address] Initial call taken by: Leodis Rains,  January 02, 2010 12:45 PM  Follow-up for Phone Call        Spoke with several pharmacies, including [facility], [facility] and [facility] per pt. request -- only have OTC fexofenadine -- pt. advised.   Follow-up by: Dutch Quint RN,  January 02, 2010 2:20 PM

## 2010-03-09 NOTE — Letter (Signed)
Summary: [facility]  [facility]   Imported By: Arta Bruce 12/12/2009 09:33:27  _____________________________________________________________________  External Attachment:    Type:   Image     Comment:   External Document

## 2010-03-09 NOTE — Progress Notes (Signed)
Summary: STILL SICK WANTS TO BE SEEN SOONER  Phone Note Call from Patient Call back at Home Phone [phone]   Complaint: Earache/Ear Infection Summary of Call: PT CALLED STATING THAT SHE WS SEEN 2WKS AGO AND FEELS WORSE THAN BEFORE.SHE STATES FEELS LIKE THE COLD IS NOT IN HER CHEST AND HAVING PRODUCTIVE YELLOW MUCUS COUGH.. PT STATES SHE USES [facility]... SHE HAS A F/U 2/28 BUT SAYS SHE CANT WAIT THAT LONG... Initial call taken by: Mikey College CMA,  March 28, 2009 12:12 PM  Follow-up for Phone Call        Is there anything open this week? either Tuesday or wednesday Follow-up by: Lehman Prom FNP,  March 28, 2009 12:41 PM  Additional Follow-up for Phone Call Additional follow up Details #1::        SPOKE WITH AND SCHEDULED PATIENT FOR 12 NOON ON 02/22. Additional Follow-up by: Leodis Rains,  March 28, 2009 3:05 PM

## 2010-03-09 NOTE — Assessment & Plan Note (Signed)
Summary: HTN   Vital Signs:  Patient profile:   65 year old female Weight:      262.9 pounds BMI:     46.01 BSA:     2.19 Temp:     97.6 degrees F oral Pulse rate:   80 / minute Pulse rhythm:   regular Resp:     20 per minute BP sitting:   104 / 72  (left arm) Cuff size:   large  Vitals Entered By: Levon Hedger (December 06, 2008 2:43 PM)  Serial Vital Signs/Assessments:  Time      Position  BP       Pulse  Resp  Temp     By                     138/74   72     16             Lehman Prom FNP  Comments: s/p fall By: Lehman Prom FNP   CC: follow-up visit HTN.....back pain, Hypertension Management, Lipid Management Is Patient Diabetic? No Pain Assessment Patient in pain? yes     Location: back Intensity: 8-9 Onset of pain  Constant  Does patient need assistance? Functional Status Self care Ambulation Normal   CC:  follow-up visit HTN.....back pain, Hypertension Management, and Lipid Management.  History of Present Illness:  Pt into the office for follow up.  Obesity - down 4 pounds since her last visit. She continues to walk daily  Social - difficult time of the year coming up for pt. Husband deceased on Thanksgiving day  Mother deceased on 2023/02/03   Hypertension History:      She denies headache, chest pain, and palpitations.  She notes no problems with any antihypertensive medication side effects.        Positive major cardiovascular risk factors include female age 29 years old or older, hyperlipidemia, and hypertension.  Negative major cardiovascular risk factors include no history of diabetes and non-tobacco-user status.        Further assessment for target organ damage reveals no history of ASHD, cardiac end-organ damage (CHF/LVH), stroke/TIA, peripheral vascular disease, renal insufficiency, or hypertensive retinopathy.    Lipid Management History:      Positive NCEP/ATP III risk factors include female age 55 years old or older and  hypertension.  Negative NCEP/ATP III risk factors include no history of early menopause without estrogen hormone replacement, non-diabetic, non-tobacco-user status, no ASHD (atherosclerotic heart disease), no prior stroke/TIA, no peripheral vascular disease, and no history of aortic aneurysm.        The patient states that she knows about the "Therapeutic Lifestyle Change" diet.  Her compliance with the TLC diet is fair.      Habits & Providers  Alcohol-Tobacco-Diet     Alcohol drinks/day: <1     Alcohol type: wine     Tobacco Status: never  Exercise-Depression-Behavior     Does Patient Exercise: yes     Exercise Counseling: to improve exercise regimen     Type of exercise: walking     Exercise (avg: min/session): <30     Times/week: 3     Depression Counseling: not indicated; screening negative for depression     Drug Use: no     Seat Belt Use: 100     Sun Exposure: occasionally  Allergies (verified): 1)  ! Duricef 2)  ! Neurontin  Review of Systems CV:  Denies chest pain or discomfort. Resp:  Denies cough. MS:  Complains of low back pain and mid back pain. Heme:  Complains of abnormal bruising; current bruise to right upper chest. denies any fall or trauma.  Physical Exam  General:  alert.   Head:  thinning hair Mouth:  upper front teeth missing Lungs:  normal breath sounds.   Heart:  normal rate and regular rhythm.   Abdomen:  normal bowel sounds.   Msk:  up to exam table Neurologic:  alert & oriented X3.   Skin:  right upper chest with healing purple bruise   Impression & Recommendations:  Problem # 1:  HYPERTENSION, BENIGN ESSENTIAL (ICD-401.1)  Her updated medication list for this problem includes:    Lisinopril 10 Mg Tabs (Lisinopril) ..... 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 1  tablet by mouth nightly as needed for spasms    Darvocet-n 100 100-650 Mg Tabs (Propoxyphene n-apap) ..... One tablet by mouth two times a day as needed for back  Problem # 3:  BRUISE (ICD-924.9) will need to check labs to check for causes of bruising pt has lab appt schduled in 2 weeks and would like to have it done then  Complete Medication List: 1)  Lexapro 10 Mg Tabs (Escitalopram oxalate) .... One tablet by mouth daily ** rx by [facility]** 2)  Lamictal 200 Mg Tabs (Lamotrigine) .... Take one tablet daily *greninger,np 3)  Trazodone Hcl 100 Mg Tabs (Trazodone hcl) .... Take one tablet at bedtime as needed *greninger, np 4)  Flexeril 10 Mg Tabs (Cyclobenzaprine hcl) .[name].. 1 tablet by mouth nightly as needed for spasms 5)  Loratadine 10 Mg Tabs (Loratadine) .... One tablet by mouth daily for allergies 6)  Omeprazole 20 Mg Cpdr (Omeprazole) .... One capsule by mouth two times a day before breakfast and dinner 7)  Lisinopril 10 Mg Tabs (Lisinopril) .... One tab by mouth daily for blood pressure 8)  Levothyroxine Sodium 50 Mcg Tabs (Levothyroxine sodium) .... One tablet by mouth daily for thyroid **note change in dose** 9)  Darvocet-n 100 100-650 Mg Tabs (Propoxyphene n-apap) .... One tablet by mouth two times a day as needed for back 10)  Fluticasone Propionate 50 Mcg/act Susp (Fluticasone propionate) .... One spray in each nostril two times a day  Other Orders: Mammogram (Screening) (Mammo)  Hypertension Assessment/Plan:      The patient's hypertensive risk group is category B: At least one risk factor (excluding diabetes) with no target organ damage.  Her calculated 10 year risk of coronary heart disease is 7 %.  Today's blood pressure is 104/72.  Her blood pressure goal is < 140/90.  Lipid Assessment/Plan:      Based on NCEP/ATP III, the patient's risk factor category is "0-1 risk factors".  The patient's lipid goals are as follows: Total cholesterol goal is 200; LDL cholesterol  goal is 130; HDL cholesterol goal is 40; Triglyceride goal is 150.    Patient Instructions: 1)  You have lost 4 pounds since her last visit. 2)  Blood pressure is doing great.  Continue medications. 3)  Read the pneumovax indications. 4)  Happy Belated Birthday. 5)  You will need to return in 1 WEEK for assessment - Fall 6)  Keep lab appointment for the next week. 7)  will also need to discuss other components of physical. Prescriptions:  FLUTICASONE PROPIONATE 50 MCG/ACT SUSP (FLUTICASONE PROPIONATE) One spray in each nostril two times a day  #1 x 1   Entered and Authorized by:   Lehman Prom FNP   Signed by:   Lehman Prom FNP on 12/06/2008   Method used:   Print then Give to Patient   RxID:   [id number] DARVOCET-N 100 100-650 MG TABS (PROPOXYPHENE N-APAP) One tablet by mouth two times a day as needed for back  #30 x 0   Entered and Authorized by:   Lehman Prom FNP   Signed by:   Lehman Prom FNP on 12/06/2008   Method used:   Print then Give to Patient   RxID:   [id number]   Prevention & Chronic Care Immunizations   Influenza vaccine: historical  (11/17/2008)    Tetanus booster: Not documented    Pneumococcal vaccine: Not documented    H. zoster vaccine: Not documented  Colorectal Screening   Hemoccult: Not documented    Colonoscopy: Not documented  Other Screening   Pap smear: per pt  (02/06/1999)    Mammogram: per pt  (02/06/1999)   Mammogram action/deferral: Ordered  (12/06/2008)    DXA bone density scan: Not documented   Smoking status: never  (12/06/2008)  Lipids   Total Cholesterol: 209  (05/20/2008)   LDL: 132  (05/20/2008)   LDL Direct: Not documented   HDL: 45  (05/20/2008)   Triglycerides: 159  (05/20/2008)    SGOT (AST): 19  (05/20/2008)   SGPT (ALT): 12  (05/20/2008)   Alkaline phosphatase: 114  (05/20/2008)   Total bilirubin: 0.4  (05/20/2008)  Hypertension   Last Blood Pressure: 104 / 72  (12/06/2008)   Serum  creatinine: 0.84  (05/20/2008)   Serum potassium 4.5  (05/20/2008)  Self-Management Support :    Hypertension self-management support: Not documented    Lipid self-management support: Not documented    Appended Document: HTN S/p Fall - Vitals stable No loss of consciousness - alert and oriented CV: RRR S1S2 Lungs: CTA Mucle: Active ROM in all extremities skin: previously noted bruise prior to fall but no lacerations or skin injuries following

## 2010-03-09 NOTE — Progress Notes (Signed)
Summary: SHAMPOO  Phone Note Call from Patient Call back at [facility] Phone [phone] Call back at [phone]   Caller: Patient Summary of Call: The pt is requesting the medical assistant transfer her Nizoral medication to [facility]) because she doesn't has transportation. [facility] FNP  Initial call taken by: Manon Hilding,  May 25, 2008 2:45 PM  Follow-up for Phone Call        forward to N. Martin,FNP Follow-up by: Levon Hedger,  June 02, 2008 3:22 PM  Additional Follow-up for Phone Call Additional follow up Details #1::        notify pt that Rx has been sent electronically to [facility]. Additional Follow-up by: Lehman Prom FNP,  June 02, 2008 4:30 PM    Additional Follow-up for Phone Call Additional follow up Details #2::    pt has picked up medication. Follow-up by: Levon Hedger,  Jun 07, 2008 4:41 PM    Prescriptions: NIZORAL 2 % SHAM (KETOCONAZOLE) One application to scalp daily, let sit for 5 minutes then rinse. Pat hair dry.  #157ml x 1   Entered and Authorized by:   Lehman Prom FNP   Signed by:   Lehman Prom FNP on 06/02/2008   Method used:   Electronically to        [id number] (retail)       [address]       [place], [place]  [address]       Ph: [phone] or [phone]       Fax: [phone]   RxID:   [id number]

## 2010-03-09 NOTE — Medication Information (Signed)
Summary: PRESCRIPTION BENEFIT INFORMATION  PRESCRIPTION BENEFIT INFORMATION   Imported By: Arta Bruce 05/19/2009 09:26:26  _____________________________________________________________________  External Attachment:    Type:   Image     Comment:   External Document

## 2010-03-09 NOTE — Assessment & Plan Note (Signed)
Summary: HTN/Sinusitis   Vital Signs:  Patient profile:   65 year old female Menstrual status:  postmenopausal Weight:      263.0 pounds BMI:     46.02 BSA:     2.19 Temp:     98.2 degrees F oral Pulse rate:   88 / minute Pulse rhythm:   regular Resp:     20 per minute BP sitting:   144 / 85  (left arm) Cuff size:   large  Vitals Entered By: Levon Hedger (March 11, 2009 2:21 PM)  Serial Vital Signs/Assessments:  Time      Position  BP       Pulse  Resp  Temp     By 3:00 PM             140/82                         Lehman Prom FNP  CC: pt needs to change pharmacies...2 month follow-up...need refills, Hypertension Management, Headache, Lipid Management Is Patient Diabetic? No Pain Assessment Patient in pain? yes     Location: back, head Intensity: 7, 12 Onset of pain  Constant  Does patient need assistance? Functional Status Self care Ambulation Normal   CC:  pt needs to change pharmacies...2 month follow-up...need refills, Hypertension Management, Headache, and Lipid Management.  History of Present Illness:  Pt into the office for 2 months f/u.   Headache - Ongoing for the past 2 weeks.  pressure on the bilateral temples that radiates up to the top of her head.   +nausea at times Pt has taken excedrim migraine, aspirin, advil.  Sometimes the headache is so intense that she does not want to speak during the day.  Headache is present upon waking in the morning Some blurring of vision Denies any snoring but pt lives alone and doesn't know +fatigue; neve feels rested after waking in the morning  Back pain - ongoing. Takes pain meds - hydrocodone as ordered.   S/p a fall at the back of her house about 2-3 weeks ago when she mistepped.  Social - Pt rides the bus to her appointments. She finds it getting harder and harder to stand and wait on the bus.      Headache HPI:      The headaches are not associated with an aura.  The location of the headaches  are bilateral.  Aggravating factors include head movement.        Positive alarm features include change in severity from prior H/A's.         Hypertension History:      She denies headache, chest pain, and palpitations.        Positive major cardiovascular risk factors include female age 69 years old or older, hyperlipidemia, and hypertension.  Negative major cardiovascular risk factors include no history of diabetes and non-tobacco-user status.        Further assessment for target organ damage reveals no history of ASHD, cardiac end-organ damage (CHF/LVH), stroke/TIA, peripheral vascular disease, renal insufficiency, or hypertensive retinopathy.    Lipid Management History:      Positive NCEP/ATP III risk factors include female age 60 years old or older and hypertension.  Negative NCEP/ATP III risk factors include no history of early menopause without estrogen hormone replacement, non-diabetic, non-tobacco-user status, no ASHD (atherosclerotic heart disease), no prior stroke/TIA, no peripheral vascular disease, and no history of aortic aneurysm.  The patient states that she knows about the "Therapeutic Lifestyle Change" diet.  Her compliance with the TLC diet is fair.  She expresses no side effects from her lipid-lowering medication.  The patient denies any symptoms to suggest myopathy or liver disease.       Medications Prior to Update: 1)  Lexapro 10 Mg Tabs (Escitalopram Oxalate) .... One Tablet By Mouth Daily ** Rx By [facility]** 2)  Lamictal 200 Mg Tabs (Lamotrigine) .... Take One Tablet Daily *greninger,np 3)  Trazodone Hcl 100 Mg  Tabs (Trazodone Hcl) .... Take One Tablet At Bedtime As Needed *greninger, Np 4)  Flexeril 10 Mg  Tabs (Cyclobenzaprine Hcl) .[name].. 1 Tablet By Mouth Nightly As Needed For Spasms 5)  Fexofenadine Hcl 180 Mg Tabs (Fexofenadine Hcl) .... One Tablet By Mouth Daily For Allergies **pt Has Tried and Failed Loratadine and Zyrtec** 6)  Omeprazole 20 Mg Cpdr  (Omeprazole) .... One Capsule By Mouth Two Times A Day Before Breakfast and Dinner 7)  Lisinopril 10 Mg Tabs (Lisinopril) .... One Tab By Mouth Daily For Blood Pressure 8)  Levothyroxine Sodium 50 Mcg Tabs (Levothyroxine Sodium) .... One Tablet By Mouth Daily For Thyroid 9)  Vicodin 5-500 Mg Tabs (Hydrocodone-Acetaminophen) .... One Tablet By Mouth Daily As Needed For Back 10)  Fluticasone Propionate 50 Mcg/act Susp (Fluticasone Propionate) .... One Spray in Each Nostril Two Times A Day 11)  Meclizine Hcl 25 Mg Tabs (Meclizine Hcl) .... One Tablet By Mouth Daily As Needed For Dizziness 12)  Lac-Hydrin 12 % Lotn (Ammonium Lactate) .... One Application Topically Two Times A Day As Needed To Affected Area  Allergies (verified): 1)  ! Duricef 2)  ! Neurontin  Review of Systems General:  Denies fever. CV:  Denies chest pain or discomfort. Resp:  Complains of cough. GI:  Denies abdominal pain, nausea, and vomiting. Neuro:  Complains of headaches.  Physical Exam  General:  alert.   Eyes:  pupils round.   Ears:  Bil TM with clear , right with slight erythema Lungs:  normal breath sounds.   Heart:  normal rate and regular rhythm.   Abdomen:  normal bowel sounds.   Msk:  up to the exam table   Impression & Recommendations:  Problem # 1:  HYPERTENSION, BENIGN ESSENTIAL (ICD-401.1) BP slightly Her updated medication list for this problem includes:    Lisinopril 10 Mg Tabs (Lisinopril) ..... 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 1 tablet by mouth nightly as needed for spasms    Vicodin 5-500 Mg Tabs (Hydrocodone-acetaminophen) ..... One tablet by mouth daily as needed for back  Problem # 3:  HEADACHE (ICD-784.0) will send pt for a sinus x-ray Her updated medication list for this problem  includes:    Vicodin 5-500 Mg Tabs (Hydrocodone-acetaminophen) ..... One tablet by mouth daily as needed for back  Orders: CT without Contrast (CT w/o contrast)  Problem # 4:  ALLERGIC RHINITIS (ICD-477.9)  Her updated medication list for this problem includes:    Fexofenadine Hcl 180 Mg Tabs (Fexofenadine hcl) ..... One tablet by mouth daily for allergies **pt has tried and failed loratadine and zyrtec**    Nasacort Aq 55 Mcg/act Aers (Triamcinolone acetonide(nasal)) ..... One spray in each nostril two times a day  **pharmacy - discontinue the fluticasone**  Complete  Medication List: 1)  Lexapro 10 Mg Tabs (Escitalopram oxalate) .... One tablet by mouth daily ** rx by [facility]** 2)  Lamictal 200 Mg Tabs (Lamotrigine) .... Take one tablet daily *greninger,np 3)  Trazodone Hcl 100 Mg Tabs (Trazodone hcl) .... Take one tablet at bedtime as needed *greninger, np 4)  Flexeril 10 Mg Tabs (Cyclobenzaprine hcl) .[name].. 1 tablet by mouth nightly as needed for spasms 5)  Fexofenadine Hcl 180 Mg Tabs (Fexofenadine hcl) .... One tablet by mouth daily for allergies **pt has tried and failed loratadine and zyrtec** 6)  Omeprazole 20 Mg Cpdr (Omeprazole) .... One capsule by mouth two times a day before breakfast and dinner 7)  Lisinopril 10 Mg Tabs (Lisinopril) .... One tab by mouth daily for blood pressure 8)  Levothyroxine Sodium 50 Mcg Tabs (Levothyroxine sodium) .... One tablet by mouth daily for thyroid 9)  Vicodin 5-500 Mg Tabs (Hydrocodone-acetaminophen) .... One tablet by mouth daily as needed for back 10)  Nasacort Aq 55 Mcg/act Aers (Triamcinolone acetonide(nasal)) .... One spray in each nostril two times a day  **pharmacy - discontinue the fluticasone** 11)  Meclizine Hcl 25 Mg Tabs (Meclizine hcl) .... One tablet by mouth daily as needed for dizziness 12)  Lac-hydrin 12 % Lotn (Ammonium lactate) .... One application topically two times a day as needed to affected area 13)  Clarithromycin  500 Mg Tabs (Clarithromycin) .... One tablet by mouth two times a day for infection 14)  Prednisone 10 Mg Tabs (Prednisone) .... Taper over 60mg  to 0mg  as directed  Hypertension Assessment/Plan:      The patient's hypertensive risk group is category B: At least one risk factor (excluding diabetes) with no target organ damage.  Her calculated 10 year risk of coronary heart disease is 15 %.  Today's blood pressure is 144/85.  Her blood pressure goal is < 140/90.  Lipid Assessment/Plan:      Based on NCEP/ATP III, the patient's risk factor category is "2 or more risk factors and a calculated 10 year CAD risk of < 20%".  The patient's lipid goals are as follows: Total cholesterol goal is 200; LDL cholesterol goal is 130; HDL cholesterol goal is 40; Triglyceride goal is 150.    Patient Instructions: 1)  Get the CT of your head and sinus to rule out inflammed sinuses. 2)  If problem continues you will need to see the ENT and sinus specialist. 3)  Follow up in this office 2 days after head CT to review the results and arrange follow up. 4)  Until then take prednisone taper and Biaxin 500mg  by mouth two times a day x 10days.  Start after the CT scan Prescriptions: PREDNISONE 10 MG TABS (PREDNISONE) Taper over 60mg  to 0mg  as directed  #21 x 0   Entered and Authorized by:   Lehman Prom FNP   Signed by:   Lehman Prom FNP on 03/11/2009   Method used:   Print then Give to Patient   RxID:   [id number] CLARITHROMYCIN 500 MG TABS (CLARITHROMYCIN) One tablet by mouth two times a day for infection  #20 x 0   Entered and Authorized by:   Lehman Prom FNP   Signed by:   Lehman Prom FNP on 03/11/2009   Method used:   Print then Give to Patient   RxID:   [id number] NASACORT AQ 55 MCG/ACT AERS (TRIAMCINOLONE ACETONIDE(NASAL)) One spray in each nostril two times a day  **pharmacy - discontinue the fluticasone**  #1 x 3  Entered and Authorized by:   Lehman Prom FNP   Signed by:    Lehman Prom FNP on 03/11/2009   Method used:   Print then Give to Patient   RxID:   [id number] VICODIN 5-500 MG TABS (HYDROCODONE-ACETAMINOPHEN) One tablet by mouth daily as needed for back  #30 x 0   Entered and Authorized by:   Lehman Prom FNP   Signed by:   Lehman Prom FNP on 03/11/2009   Method used:   Print then Give to Patient   RxID:   [id number] LISINOPRIL 10 MG TABS (LISINOPRIL) one tab by mouth daily for blood pressure  #30 x 5   Entered and Authorized by:   Lehman Prom FNP   Signed by:   Lehman Prom FNP on 03/11/2009   Method used:   Print then Give to Patient   RxID:   [id number]   Appended Document: HTN/Sinusitis     Allergies: 1)  ! Duricef 2)  ! Neurontin   Complete Medication List: 1)  Lexapro 10 Mg Tabs (Escitalopram oxalate) .... One tablet by mouth daily ** rx by [facility]** 2)  Lamictal 200 Mg Tabs (Lamotrigine) .... Take one tablet daily *greninger,np 3)  Trazodone Hcl 100 Mg Tabs (Trazodone hcl) .... Take one tablet at bedtime as needed *greninger, np 4)  Flexeril 10 Mg Tabs (Cyclobenzaprine hcl) .[name].. 1 tablet by mouth nightly as needed for spasms 5)  Fexofenadine Hcl 180 Mg Tabs (Fexofenadine hcl) .... One tablet by mouth daily for allergies **pt has tried and failed loratadine and zyrtec** 6)  Omeprazole 20 Mg Cpdr (Omeprazole) .... One capsule by mouth two times a day before breakfast and dinner 7)  Lisinopril 10 Mg Tabs (Lisinopril) .... One tab by mouth daily for blood pressure 8)  Levothyroxine Sodium 50 Mcg Tabs (Levothyroxine sodium) .... One tablet by mouth daily for thyroid 9)  Vicodin 5-500 Mg Tabs (Hydrocodone-acetaminophen) .... One tablet by mouth daily as needed for back 10)  Nasacort Aq 55 Mcg/act Aers (Triamcinolone acetonide(nasal)) .... One spray in each nostril two times a day  **pharmacy - discontinue the fluticasone** 11)  Meclizine Hcl 25 Mg Tabs (Meclizine hcl) .... One tablet by mouth daily  as needed for dizziness 12)  Lac-hydrin 12 % Lotn (Ammonium lactate) .... One application topically two times a day as needed to affected area 13)  Clarithromycin 500 Mg Tabs (Clarithromycin) .... One tablet by mouth two times a day for infection 14)  Prednisone 10 Mg Tabs (Prednisone) .... Taper over 60mg  to 0mg  as directed  Other Orders: Tdap => 57yrs IM [phone]) Admin 1st Vaccine (42595) Admin 1st Vaccine [facility]) [phone])    Tetanus/Td Vaccine    Vaccine Type: Tdap    Site: right deltoid    Mfr: Sanofi Pasteur    Dose: 0.5 ml    Route: IM    Given by: Levon Hedger    Exp. Date: 05/13/2011    Lot #: [id number]    VIS given: 12/24/06 version given March 11, 2009.

## 2010-03-09 NOTE — Progress Notes (Signed)
Summary: REFILL MEDS  Phone Note Call from Patient   Caller: Patient Reason for Call: Refill Medication Summary of Call: Pt wants to know If Dr Daphine Deutscher can prescribe DARVOCET & FLEXERIL 100MG   ([facility]) PLEASE, CALL HER AT  [phone]  THANK YOU . Initial call taken by: Cheryll Dessert,  August 18, 2007 2:52 PM  Follow-up for Phone Call        forwarded to provider Follow-up by: Levon Hedger,  August 19, 2007 6:30 PM  Additional Follow-up for Phone Call Additional follow up Details #1::        Rx printed and put in basket to fax to pharmacy Additional Follow-up by: Lehman Prom FNP,  August 20, 2007 7:58 AM    Additional Follow-up for Phone Call Additional follow up Details #2::    pt notified.  Rx faxed to pharmacy.  phone note complete  Follow-up by: Levon Hedger,  August 20, 2007 10:46 AM    Prescriptions: FLEXERIL 10 MG  TABS (CYCLOBENZAPRINE HCL) 1 tablet by mouth nightly as needed for spasms  #30 x 0   Entered and Authorized by:   Lehman Prom FNP   Signed by:   Lehman Prom FNP on 08/20/2007   Method used:   Printed then faxed to ...       [address]       [address]       [place], [place]  [address]       Ph: [phone]       Fax: [phone]   RxID:   [id number] DARVOCET-N 100 100-650 MG  TABS (PROPOXYPHENE N-APAP) 1 tablet by mouth two times a day as needed for pain  #60 x 0   Entered and Authorized by:   Lehman Prom FNP   Signed by:   Lehman Prom FNP on 08/20/2007   Method used:   Printed then faxed to ...       [address]       [address]       [place], [place]  [address]       Ph: [phone]       Fax: [phone]   RxID:   [id number]

## 2010-03-09 NOTE — Assessment & Plan Note (Signed)
Summary: Headache/HTN   Vital Signs:  Patient profile:   65 year old female Menstrual status:  postmenopausal Weight:      273.0 pounds BMI:     47.77 BSA:     2.22 Temp:     98.0 degrees F oral Pulse rate:   97 / minute Pulse rhythm:   regular Resp:     20 per minute BP sitting:   137 / 96  (left arm) Cuff size:   large  Vitals Entered By: Levon Hedger (May 02, 2009 2:20 PM) CC: follow-up visit about her ears and they are still bothering her, Headache, Lipid Management, Hypertension Management, Back Pain Is Patient Diabetic? No Pain Assessment Patient in pain? yes     Location: ears  Does patient need assistance? Functional Status Self care Ambulation Normal   CC:  follow-up visit about her ears and they are still bothering her, Headache, Lipid Management, Hypertension Management, and Back Pain.  History of Present Illness:  Pt into the office for follow up on headache. Pt was prescribed imitrex on her last visit.  She has used 10 tablets within the last month. It was helpful for the headache. Initially she had to take 2 tablets for 2 straight days for headache  Allergies - She is due to get her carpet cleaned on tomorrow. She did find some mold under a washer that she moved out of her house  Back Pain History:      The patient's back pain has been present for > 6 weeks.  The pain is located in the lower back region and does not radiate below the knees.  She states that she has had a prior history of back pain.  The patient has not had any recent physical therapy for her back pain.        Other comments:  Pain med as needed .    Headache HPI:      The patient comes in for chronic management of stable headaches.  The headaches will last anywhere from 30 minutes to several days at a time.  She has approximately 5+ headaches per month.        The headaches are not associated with an aura.  The location of the headaches are bilateral.  Aggravating factors include  head movement.        Additional history: Imitrex helped with her headaches.    Headache Treatment History:      Ergots were tried but not effective.    Hypertension History:      She denies headache, chest pain, and palpitations.  She notes no problems with any antihypertensive medication side effects.  Pt is taking medications as ordered.        Positive major cardiovascular risk factors include female age 55 years old or older, hyperlipidemia, and hypertension.  Negative major cardiovascular risk factors include no history of diabetes and non-tobacco-user status.        Further assessment for target organ damage reveals no history of ASHD, cardiac end-organ damage (CHF/LVH), stroke/TIA, peripheral vascular disease, renal insufficiency, or hypertensive retinopathy.    Lipid Management History:      Positive NCEP/ATP III risk factors include female age 6 years old or older and hypertension.  Negative NCEP/ATP III risk factors include no history of early menopause without estrogen hormone replacement, non-diabetic, non-tobacco-user status, no ASHD (atherosclerotic heart disease), no prior stroke/TIA, no peripheral vascular disease, and no history of aortic aneurysm.        The  patient states that she knows about the "Therapeutic Lifestyle Change" diet.  Her compliance with the TLC diet is fair.  She expresses no side effects from her lipid-lowering medication.  Comments include: pt is taking meds as ordered.  The patient denies any symptoms to suggest myopathy or liver disease.        Medications Prior to Update: 1)  Lexapro 10 Mg Tabs (Escitalopram Oxalate) .... One Tablet By Mouth Daily ** Rx By [facility]** 2)  Lamictal 200 Mg Tabs (Lamotrigine) .... Take One Tablet Daily *greninger,np 3)  Trazodone Hcl 100 Mg  Tabs (Trazodone Hcl) .... Take One Tablet At Bedtime As Needed *greninger, Np 4)  Flexeril 10 Mg  Tabs (Cyclobenzaprine Hcl) .[name].. 1 Tablet By Mouth Nightly As Needed For  Spasms 5)  Fexofenadine Hcl 180 Mg Tabs (Fexofenadine Hcl) .... One Tablet By Mouth Daily For Allergies **pt Has Tried and Failed Loratadine and Zyrtec** 6)  Omeprazole 20 Mg Cpdr (Omeprazole) .... One Capsule By Mouth Two Times A Day Before Breakfast and Dinner 7)  Lisinopril 10 Mg Tabs (Lisinopril) .... One Tab By Mouth Daily For Blood Pressure 8)  Levothyroxine Sodium 50 Mcg Tabs (Levothyroxine Sodium) .... One Tablet By Mouth Daily For Thyroid 9)  Vicodin 5-500 Mg Tabs (Hydrocodone-Acetaminophen) .... One Tablet By Mouth Daily As Needed For Back 10)  Nasacort Aq 55 Mcg/act Aers (Triamcinolone Acetonide(Nasal)) .... One Spray in Each Nostril Two Times A Day  **pharmacy - Discontinue The Fluticasone** 11)  Meclizine Hcl 25 Mg Tabs (Meclizine Hcl) .... One Tablet By Mouth Daily As Needed For Dizziness 12)  Lac-Hydrin 12 % Lotn (Ammonium Lactate) .... One Application Topically Two Times A Day As Needed To Affected Area 13)  Mucinex Dm 30-600 Mg Xr12h-Tab (Dextromethorphan-Guaifenesin) .... One Tablet By Mouth Two Times A Day As Needed For Cough and Congestion 14)  Flovent Diskus 100 Mcg/blist Aepb (Fluticasone Propionate (Inhal)) .... One Puff Twice Daily 15)  Sumatriptan Succinate 100 Mg Tabs (Sumatriptan Succinate) .... One Tablet By Mouth At Onset of Headache, May Repeat in 2 Hours If Headache Persists 16)  Antipyrine-Benzocaine 5.4-1.4 % Soln (Benzocaine-Antipyrine) .... 2 Drops in Each Ear Two Times A Day For Pain  Allergies (verified): 1)  ! Duricef 2)  ! Neurontin  Review of Systems General:  Denies fever. CV:  Denies chest pain or discomfort. Resp:  Denies cough. GI:  Denies abdominal pain, nausea, and vomiting. MS:  Complains of low back pain. Neuro:  Complains of headaches. Psych:  Complains of depression; pt is still going to the [facility].  Physical Exam  General:  alert.  obese Head:  normocephalic.   Ears:  bil ears with clear fluid  Mouth:  fair dentition.    Lungs:  normal breath sounds.   Heart:  normal rate and regular rhythm.   Msk:  up to the exam table Neurologic:  alert & oriented X3.   gait normal   Impression & Recommendations:  Problem # 1:  HYPERTENSION, BENIGN ESSENTIAL (ICD-401.1) DASH diet continue current medications Her updated medication list for this problem includes:    Lisinopril 10 Mg Tabs (Lisinopril) ..... One tab by mouth daily for blood pressure  Problem # 2:  HEADACHE (ICD-784.0) headache is doing much better avoid triggers Her updated medication list for this problem includes:    Vicodin 5-500 Mg Tabs (Hydrocodone-acetaminophen) ..... One tablet by mouth daily as needed for back    Sumatriptan Succinate 100 Mg Tabs (Sumatriptan succinate) ..... 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 1 tablet by mouth nightly as needed for spasms    Vicodin 5-500 Mg Tabs (Hydrocodone-acetaminophen) ..... One tablet by mouth daily as needed for back  Problem # 4:  OBESITY (ICD-278.00) spoke with pt about the need to lose weight.  this would greatly improve her health conditions up 10 pounds since her last office visit  Complete Medication List: 1)  Lexapro 10 Mg Tabs (Escitalopram oxalate) .... One tablet by mouth daily ** rx by [facility]** 2)  Lamictal 200 Mg Tabs (Lamotrigine) .... Take one tablet daily *greninger,np 3)  Trazodone Hcl 100 Mg Tabs (Trazodone hcl) .... Take one tablet at bedtime as needed *greninger, np 4)  Flexeril 10 Mg Tabs (Cyclobenzaprine hcl) .[name].. 1 tablet by mouth nightly as needed for spasms 5)  Fexofenadine Hcl 180 Mg Tabs (Fexofenadine hcl) .... One tablet by mouth daily for allergies **pt has tried and failed loratadine and zyrtec** 6)  Omeprazole  20 Mg Cpdr (Omeprazole) .... One capsule by mouth two times a day before breakfast and dinner 7)  Lisinopril 10 Mg Tabs (Lisinopril) .... One tab by mouth daily for blood pressure 8)  Levothyroxine Sodium 50 Mcg Tabs (Levothyroxine sodium) .... One tablet by mouth daily for thyroid 9)  Vicodin 5-500 Mg Tabs (Hydrocodone-acetaminophen) .... One tablet by mouth daily as needed for back 10)  Nasacort Aq 55 Mcg/act Aers (Triamcinolone acetonide(nasal)) .... One spray in each nostril two times a day  **pharmacy - discontinue the fluticasone** 11)  Lac-hydrin 12 % Lotn (Ammonium lactate) .... One application topically two times a day as needed to affected area 12)  Mucinex Dm 30-600 Mg Xr12h-tab (Dextromethorphan-guaifenesin) .... One tablet by mouth two times a day as needed for cough and congestion 13)  Flovent Diskus 100 Mcg/blist Aepb (Fluticasone propionate (inhal)) .... One puff twice daily 14)  Sumatriptan Succinate 100 Mg Tabs (Sumatriptan succinate) .... One tablet by mouth at onset of headache, may repeat in 2 hours if headache persists 15)  Antipyrine-benzocaine 5.4-1.4 % Soln (Benzocaine-antipyrine) .... 2 drops in each ear two times a day for pain  Hypertension Assessment/Plan:      The patient's hypertensive risk group is category B: At least one risk factor (excluding diabetes) with no target organ damage.  Her calculated 10 year risk of coronary heart disease is 15 %.  Today's blood pressure is 137/96.  Her blood pressure goal is < 140/90.  Lipid Assessment/Plan:      Based on NCEP/ATP III, the patient's risk factor category is "2 or more risk factors and a calculated 10 year CAD risk of < 20%".  The patient's lipid goals are as follows: Total cholesterol goal is 200; LDL cholesterol goal is 130; HDL cholesterol goal is 40; Triglyceride goal is 150.    Patient Instructions: 1)  Continue current medications. 2)  Take your allergy medications as ordered. 3)  Be mindful of pollen that is  getting ready to fall. 4)  Netti Pot may be benefical for your sinuses. 5)  Follow up in 2 months with n.martin, fnp  Prescriptions: SUMATRIPTAN SUCCINATE 100 MG TABS (SUMATRIPTAN SUCCINATE) One tablet by mouth at onset of headache, may repeat in 2 hours if headache persists  #10 x 0   Entered  and Authorized by:   Lehman Prom FNP   Signed by:   Lehman Prom FNP on 05/02/2009   Method used:   Print then Give to Patient   RxID:   [id number] VICODIN 5-500 MG TABS (HYDROCODONE-ACETAMINOPHEN) One tablet by mouth daily as needed for back  #30 x 0   Entered and Authorized by:   Lehman Prom FNP   Signed by:   Lehman Prom FNP on 05/02/2009   Method used:   Print then Give to Patient   RxID:   [id number] VICODIN 5-500 MG TABS (HYDROCODONE-ACETAMINOPHEN) One tablet by mouth daily as needed for back  #30 x 0   Entered and Authorized by:   Lehman Prom FNP   Signed by:   Lehman Prom FNP on 05/02/2009   Method used:   Print then Give to Patient   RxID:   [id number] SUMATRIPTAN SUCCINATE 100 MG TABS (SUMATRIPTAN SUCCINATE) One tablet by mouth at onset of headache, may repeat in 2 hours if headache persists  #10 x 0   Entered and Authorized by:   Lehman Prom FNP   Signed by:   Lehman Prom FNP on 05/02/2009   Method used:   Print then Give to Patient   RxID:   [id number]

## 2010-03-09 NOTE — Progress Notes (Signed)
Summary: REFILLS  Phone Note Call from Patient Call back at Home Phone [phone] Call back at [phone]   Summary of Call: Pt want to let know to the provider that medicaid cannot pay from her last medication the pt cannot recalled the name of the medication but she knows if for her allergie and she also needs her medication for her back as well (vidodin).  [facility] [place]) [place] FNP  Initial call taken by: Manon Hilding,  January 21, 2009 2:57 PM  Follow-up for Phone Call        PTIENT IS CALLING TODAY BECAUSE SHE HASN'T HEARD FROM ANYONE ABOUT HER MEDICATIONS. Follow-up by: Leodis Rains,  January 24, 2009 2:53 PM  Additional Follow-up for Phone Call Additional follow up Details #1::        forward to N. Daphine Deutscher, FNP  Additional Follow-up by: Levon Hedger,  January 24, 2009 5:20 PM    Additional Follow-up for Phone Call Additional follow up Details #2::    Vicodin Rx printed - shiela to fax to pharmacy need prior approval form for allegra (allergy medication) before i can start the process.  Is the form in the prior approval stack? Follow-up by: Lehman Prom FNP,  January 27, 2009 8:09 AM  Additional Follow-up for Phone Call Additional follow up Details #3:: Details for Additional Follow-up Action Taken: FAXED THE VICODIN HAVE NOT SEEN PA FOR THE ALLEGRA  resent Rx and PA form obtained. Called and approval granted for 1 year starting today. notify pt n.martin, fnp  January 27, 2009  4:39 PM   pt notified rx's sent to pharmacy...............Marland KitchenMikey College CMA  January 27, 2009 5:00 PM  Additional Follow-up by: Arta Bruce,  January 27, 2009 11:39 AM  New/Updated Medications: FEXOFENADINE HCL 180 MG TABS (FEXOFENADINE HCL) One tablet by mouth daily for allergies **Pt has tried and failed loratadine and zyrtec** Prescriptions: FEXOFENADINE HCL 180 MG TABS (FEXOFENADINE HCL) One tablet by mouth daily for allergies **Pt has tried and failed  loratadine and zyrtec**  #30 x 5   Entered and Authorized by:   Lehman Prom FNP   Signed by:   Lehman Prom FNP on 01/27/2009   Method used:   Electronically to        [facility] [address] [address] (retail)       [address]       [place], [place]  [id number]       Ph: [phone]       Fax: [phone]   RxID:   [id number] VICODIN 5-500 MG TABS (HYDROCODONE-ACETAMINOPHEN) One tablet by mouth daily as needed for back  #30 x 0   Entered and Authorized by:   Lehman Prom FNP   Signed by:   Lehman Prom FNP on 01/27/2009   Method used:   Printed then faxed to ...       [facility] [address] (retail)       [address]       [place], [place]  [id number]       Ph: [phone]       Fax: [phone]   RxID:   [id number]

## 2010-03-09 NOTE — Progress Notes (Signed)
Summary: NOT SURE IF DOING NETTIE POT RIGHT  Phone Note Call from Patient Call back at Home Phone [phone]   Reason for Call: Talk to Doctor Summary of Call: MARTIN PT. MS Capano HAS QUESTION ABOUT DOING THE NETTIE POT, AND SHE HAS SOME CONCERNS ABOUT HOW SHE IS DOING IT AND SHE SAYS THERE IS NO DIRECTIONS ON HOW TO DO IT. Initial call taken by: Leodis Rains,  May 17, 2009 4:21 PM  Follow-up for Phone Call        It would be difficult to explain over the phone. the pharmacist should be able to demonstrate (instructins in basket to mail to pt) I received her letter about the nasal spray and will have to change her back to fluticasone Follow-up by: Lehman Prom FNP,  May 18, 2009 1:52 PM  Additional Follow-up for Phone Call Additional follow up Details #1::        lmom  Chantel Miller  May 19, 2009 11:20 AM     Additional Follow-up for Phone Call Additional follow up Details #2::    Pt given above instructions. Follow-up by: Vesta Mixer CMA,  May 19, 2009 2:29 PM  New/Updated Medications: FLUTICASONE PROPIONATE 50 MCG/ACT SUSP (FLUTICASONE PROPIONATE) One spray in each nosril two times a day **hold head down** Pharmacy - discontiue nasacort Prescriptions: FLUTICASONE PROPIONATE 50 MCG/ACT SUSP (FLUTICASONE PROPIONATE) One spray in each nosril two times a day **hold head down** Pharmacy - discontiue nasacort  #1 x 5   Entered and Authorized by:   Lehman Prom FNP   Signed by:   Lehman Prom FNP on 05/18/2009   Method used:   Electronically to        [facility] [phone]* (retail)       [address]       [place], [place]  [address]       Ph: [phone]       Fax: [phone]   RxID:   [id number]

## 2010-03-09 NOTE — Letter (Signed)
Summary: Handout Printed  Printed Handout:  - Weight Loss Diets

## 2010-03-09 NOTE — Letter (Signed)
Summary: Lipid Letter  [facility]  [address] [place], [place] [address]   Phone: [phone]  Fax: [phone]    11/23/2009  Lindsey Rowe [address] [place], [place]  [address]  Dear Ms. Lindsey Rowe:  We have carefully reviewed your last lipid profile from 11/22/2009 and the results are noted below with a summary of recommendations for lipid management.    Cholesterol:       196     Goal: less than 200   HDL "good" Cholesterol:   45     Goal: greater than 40   LDL "bad" Cholesterol:   120     Goal: less than 100   Triglycerides:       156     Goal: less than 150    Cholesterol is better.  Continue current medications.    Current Medications: 1)    Citalopram Hydrobromide 20 Mg Tabs (Citalopram hydrobromide) .... One tablet by mouth daily **rx by [facility]** 2)    Lamictal 200 Mg Tabs (Lamotrigine) .... Take one tablet daily *greninger,np 3)    Trazodone Hcl 100 Mg  Tabs (Trazodone hcl) .... Take one tablet at bedtime as needed *greninger, np 4)    Levocetirizine Dihydrochloride 5 Mg Tabs (Levocetirizine dihydrochloride) .... One tablet by mouth daily for allergies 5)    Omeprazole 20 Mg Cpdr (Omeprazole) .... One capsule by mouth two times a day before breakfast and dinner 6)    Lisinopril 10 Mg Tabs (Lisinopril) .... One tab by mouth daily for blood pressure 7)    Levothyroxine Sodium 50 Mcg Tabs (Levothyroxine sodium) .... One tablet by mouth daily for thyroid 8)    Vicodin 5-500 Mg Tabs (Hydrocodone-acetaminophen) .... One tablet by mouth daily as needed for back 9)    Lac-hydrin 12 % Lotn (Ammonium lactate) .... One application topically two times a day as needed to affected area 10)    Sumatriptan Succinate 100 Mg Tabs (Sumatriptan succinate) .... One tablet by mouth at onset of headache, may repeat in 2 hours if headache persists 11)    Fluticasone Propionate 50 Mcg/act Susp (Fluticasone propionate) .... One spray in each nosril  two times a day **hold head down** pharmacy - discontiue nasacort 12)    Mupirocin 2 % Oint (Mupirocin) .... Use to affected area two times a day until healed 13)    Pravastatin Sodium 20 Mg Tabs (Pravastatin sodium) .... One tablet by mouth nightly for cholesterol  If you have any questions, please call. We appreciate being able to work with you.   Sincerely,  [facility] Lehman Prom FNP

## 2010-03-09 NOTE — Letter (Signed)
Summary: Handout Printed  Printed Handout:  - Hypothyroidism 

## 2010-03-09 NOTE — Assessment & Plan Note (Signed)
Summary: Chronic f/u   Vital Signs:  Patient Profile:   65 Years Old Female Height:     63.50 inches Weight:      274 pounds BMI:     47.95 BSA:     2.23 Temp:     97.9 degrees F oral Pulse rate:   108 / minute Resp:     16 per minute BP sitting:   142 / 96  (left arm)  Pt. in pain?   yes    Location:   back    Intensity:   7    Type:       aching              Is Patient Diabetic? No  Does patient need assistance? Functional Status Self care Ambulation Impaired:Risk for fall     Chief Complaint:  f/u on back, bruise on left leg, and numbness in legs.  History of Present Illness:  Pt into the office for follow up.  Continued back and leg pain. She has an appt with specialist on November 16th. Pt did have 2 injections since her last visit here which did not help. Pt does have a Rx for Norco 5/325mg  from ortho.  she only takes it if she really need. Notes that after her last injection she noticed a bruise to her left leg.  This bruise has progressed. Denies any injury. Reports that she has labs done at [facility] as ordered by specialist.  Results were to be faxed to this office.    Skin problems - recurrent lesions in scalp, on face, back.  She has seen dermatologist in the past.  Biopsy done no dx.  areas are recurrent.  applies neosporin to help with itching.    Prior Medication List:  FLUOXETINE HCL 20 MG  CAPS (FLUOXETINE HCL) take one capsule daily *Greninger LAMICTAL 200 MG TABS (LAMOTRIGINE) Take one tablet daily *Greninger,NP * HYDROXYZINE HCL 25 MG  CAPS (HYDROXYZINE HCL) Take 1 or 2 capsules at bedtime as needed *Greninger,NP TRAZODONE HCL 100 MG  TABS (TRAZODONE HCL) take one tablet at bedtime as needed *Greninger, NP PIROXICAM 20 MG  CAPS (PIROXICAM) 1 tablet by mouth daily FLEXERIL 10 MG  TABS (CYCLOBENZAPRINE HCL) 1 tablet by mouth nightly as needed for spasms DARVOCET-N 100 100-650 MG  TABS (PROPOXYPHENE N-APAP) 1 tablet by mouth two times a day  as needed for pain PRAVACHOL 40 MG  TABS (PRAVASTATIN SODIUM) 1 tablet by mouth at night for cholesterol   Current Allergies (reviewed today): ! DURICEF ! NEURONTIN     Review of Systems  CV      Denies chest pain or discomfort.  Resp      Denies cough.  GI      Denies abdominal pain.  MS      Complains of low back pain.  Derm      Complains of lesion(s).  Heme      Complains of abnormal bruising.      left leg   Physical Exam  General:     alert and overweight-appearing.   Lungs:     normal breath sounds.   Heart:     normal rate and regular rhythm.   Abdomen:     soft, non-tender, and normal bowel sounds.   Msk:     bruising to left leg - purple -Homan's  Pulses:     DP +2 Extremities:     no edema Neurologic:     alert &  oriented X3 and sensation intact to light touch.   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 she is getting Rx for Narco from ortho. Her updated medication list for this problem includes:    Piroxicam 20 Mg Caps (Piroxicam) .[name]... 1 tablet by mouth daily    Flexeril 10 Mg Tabs (Cyclobenzaprine hcl) .[name]... 1 tablet by mouth nightly as needed for spasms    Darvocet-n 100 100-650 Mg Tabs (Propoxyphene n-apap) .[name]... 1 tablet by mouth two times a day as needed for pain    Norco 5-325 Mg Tabs (Hydrocodone-acetaminophen) .[name]... Rx by specialist   Problem # 2:  CONTUSION, LOWER LEG, LEFT (ICD-924.10) very unusual given no injury. pt reports cbc was done at [facility] on day prior to this visit.  This provider has not seen those results. Orders: Ultrasound (Ultrasound)   Problem # 3:  HYPERCHOLESTEROLEMIA (ICD-272.0) continue current meds. Her updated medication list for this problem includes:    Pravachol 40 Mg Tabs (Pravastatin sodium) .[name]... 1 tablet by mouth at night for cholesterol   Problem # 4:  OBESITY  (ICD-278.00)  Problem # 5:  DERMATITIS (ICD-692.9) will treat with antibiotics discussed with pt given her anxiety areas may be due to neurodermatitis  Complete Medication List: 1)  Fluoxetine Hcl 20 Mg Caps (Fluoxetine hcl) .... Take one capsule daily *greninger 2)  Lamictal 200 Mg Tabs (Lamotrigine) .... Take one tablet daily *greninger,np 3)  Hydroxyzine Hcl 25 Mg Caps (hydroxyzine Hcl)  .... Take 1 or 2 capsules at bedtime as needed *greninger,np 4)  Trazodone Hcl 100 Mg Tabs (Trazodone hcl) .... Take one tablet at bedtime as needed *greninger, np 5)  Piroxicam 20 Mg Caps (Piroxicam) .[name].. 1 tablet by mouth daily 6)  Flexeril 10 Mg Tabs (Cyclobenzaprine hcl) .[name].. 1 tablet by mouth nightly as needed for spasms 7)  Darvocet-n 100 100-650 Mg Tabs (Propoxyphene n-apap) .[name].. 1 tablet by mouth two times a day as needed for pain 8)  Pravachol 40 Mg Tabs (Pravastatin sodium) .[name].. 1 tablet by mouth at night for cholesterol 9)  Bactroban 2 % Oint (Mupirocin) .... One application topically to affected area 10)  Keflex 500 Mg Caps (Cephalexin) .[name].. 1 capsule by mouth three times a day 11)  Norco 5-325 Mg Tabs (Hydrocodone-acetaminophen) .... Rx by specialist   Patient Instructions: 1)  Use an antimicrobial action soap. 2)  Get ultrasound of left lower extremity 3)  Apply bactroban to affected area. 4)  Use Keflex three times a day for infection 5)  Follow up in this office as needed   Prescriptions: BACTROBAN 2 % OINT (MUPIROCIN) one application topically to affected area  #60gm x 0   Entered and Authorized by:   Lehman Prom FNP   Signed by:   Lehman Prom FNP on 11/12/2007   Method used:   Print then Give to Patient   RxID:   [id number] KEFLEX 500 MG CAPS (CEPHALEXIN) 1 capsule by mouth three times a day  #21 x 0   Entered and Authorized by:   Lehman Prom FNP   Signed by:   Lehman Prom FNP on 11/12/2007   Method used:   Print then Give to Patient   RxID:    [id number] BACTROBAN 2 % OINT (MUPIROCIN) one application topically to affected area  #60gm x 0   Entered and Authorized by:  Lehman Prom FNP   Signed by:   Lehman Prom FNP on 11/12/2007   Method used:   Electronically to        [facility] [address]* (retail)       [address]       [place], [place]  [address]       Ph: [phone]       Fax: [phone]   RxID:   [id number]  ]

## 2010-03-09 NOTE — Progress Notes (Signed)
Summary: MRI  Referral  Phone Note Call from Patient Call back at [facility] Phone [phone]   Caller: Patient Summary of Call: The patient wants to know how soon she is going to be referral for MRI. FnP Daphine Deutscher Initial call taken by: Manon Hilding,  May 28, 2007 3:13 PM  Follow-up for Phone Call        spoke with pt she had an appoint setup on 05/19/07 that she says she did not know about, so I rescheduled her for 06/04/07 at 2:00pm and she is aware.  Phone call complete Follow-up by: Levon Hedger,  May 29, 2007 2:56 PM

## 2010-03-09 NOTE — Assessment & Plan Note (Signed)
Summary: Dental problems  Comments visit scanned in chart/pt seen by Dr Pollyann Kennedy   Allergies: 1)  ! Duricef 2)  ! Neurontin   Complete Medication List: 1)  Lexapro 10 Mg Tabs (Escitalopram oxalate) .... One tablet by mouth daily ** rx by [facility]** 2)  Lamictal 200 Mg Tabs (Lamotrigine) .... Take one tablet daily *greninger,np 3)  Trazodone Hcl 100 Mg Tabs (Trazodone hcl) .... Take one tablet at bedtime as needed *greninger, np 4)  Flexeril 10 Mg Tabs (Cyclobenzaprine hcl) .[name].. 1 tablet by mouth nightly as needed for spasms 5)  Loratadine 10 Mg Tabs (Loratadine) .... One tablet by mouth daily for allergies 6)  Nizoral 2 % Sham (Ketoconazole) .... One application to scalp daily, let sit for 5 minutes then rinse. pat hair dry.

## 2010-03-09 NOTE — Assessment & Plan Note (Signed)
Summary: Back pain   Vital Signs:  Patient Profile:   65 Years Old Female Height:     63.50 inches Weight:      264 pounds BMI:     46.20 BSA:     2.19 Temp:     97.6 degrees F oral Pulse rate:   84 / minute Pulse rhythm:   regular Resp:     20 per minute BP sitting:   122 / 90  (left arm) Cuff size:   large  Pt. in pain?   yes    Location:   back    Intensity:   8  Vitals Entered By: Levon Hedger (September 17, 2007 2:10 PM)              Is Patient Diabetic? No  Does patient need assistance? Ambulation Normal     Chief Complaint:  2 month follow-up/  pt would like to discuss a diet plan for her.  History of Present Illness:  Pt into the office for 2 month f/u.  Back pain - Pt continues with pain in her lower back. Her case was discussed with [facility] however they did not feel that she was a candidate.  MRI done while a pt here and she does have those films at home.  some radiation into the hips and then down into the rain  Obesity - Pt has now lost total of 32 pounds.  she is actively trying to decrease her calories.  She is trying to increase her activity.  She is now able to walk about 5 mintues per day.  She is not able to ambulate for long distances due to back. Since wieght loss she has not been as fatiqued and tired. report that she never had the anti-inflamatories - piroxicam.  She is requesting something stronger for pan other than the darvocet.    Cholesterol - pt is still taking the pravachol.  she is due for fasting labs in 1 week.  Bipolar - pt is seeing Greninger at the [facility]  Dental appointment - upcoming August 22nd.    Updated Prior Medication List: FLUOXETINE HCL 20 MG  CAPS (FLUOXETINE HCL) take one capsule daily *Greninger LAMICTAL 200 MG TABS (LAMOTRIGINE) Take one tablet daily *Greninger,NP * HYDROXYZINE HCL 25 MG  CAPS (HYDROXYZINE HCL) Take 1 or 2 capsules at bedtime as needed *Greninger,NP TRAZODONE HCL 100 MG  TABS (TRAZODONE  HCL) take one tablet at bedtime as needed *Greninger, NP PIROXICAM 20 MG  CAPS (PIROXICAM) 1 tablet by mouth daily FLEXERIL 10 MG  TABS (CYCLOBENZAPRINE HCL) 1 tablet by mouth nightly as needed for spasms DARVOCET-N 100 100-650 MG  TABS (PROPOXYPHENE N-APAP) 1 tablet by mouth two times a day as needed for pain LEXAPRO 10 MG TABS (ESCITALOPRAM OXALATE) Take 1 tab by mouth daily *Greniger, NP PRAVACHOL 40 MG  TABS (PRAVASTATIN SODIUM) 1 tablet by mouth at night for cholesterol  Current Allergies (reviewed today): ! DURICEF ! NEURONTIN    Risk Factors: Tobacco use:  never Drug use:  no Alcohol use:  yes    Type:  wine Seatbelt use:  100 % Sun Exposure:  occasionally  Mammogram History:    Date of Last Mammogram:  02/06/1999  PAP Smear History:    Date of Last PAP Smear:  02/06/1999   Review of Systems  CV      Denies chest pain or discomfort.  Resp      Denies cough.  MS  Complains of low back pain.   Physical Exam  General:     alert and overweight-appearing.   Head:     normocephalic.   Eyes:     pupils equal and pupils round.   Mouth:     front tooth is missing Lungs:     normal breath sounds.   Heart:     normal rate and regular rhythm.   Abdomen:     soft, non-tender, normal bowel sounds, no hepatomegaly, and no splenomegaly.   Msk:     up to exam table without assist though slow Pulses:     DP +2 Extremities:     no edema Neurologic:     alert & oriented X3.   Psych:     Oriented X3.     Detailed Back/Spine Exam  General:    obese.    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 pt has MRI films. advised pt to start anti-inflammatory.  she is to also continue  darvocet. Her updated medication list for this problem includes:    Piroxicam 20 Mg Caps (Piroxicam) .[name]... 1 tablet by mouth daily    Flexeril 10 Mg Tabs (Cyclobenzaprine hcl) .[name]... 1 tablet by mouth nightly as needed for spasms    Darvocet-n 100 100-650 Mg Tabs (Propoxyphene n-apap) .[name]... 1 tablet by mouth two times a day as needed for pain  Orders: Orthopedic Referral (Ortho)   Problem # 2:  OBESITY (ICD-278.00) she is down 32 pounds.  pt is very encouraged that she will lose more weight.  Problem # 3:  HYPERCHOLESTEROLEMIA (ICD-272.0) Will need to f/u with fasting labs Her updated medication list for this problem includes:    Pravachol 40 Mg Tabs (Pravastatin sodium) .[name]... 1 tablet by mouth at night for cholesterol   Problem # 4:  BIPOLAR AFFECTIVE DISORDER (ICD-296.80) pt to continue f/u at the [facility].  Complete Medication List: 1)  Fluoxetine Hcl 20 Mg Caps (Fluoxetine hcl) .... Take one capsule daily *greninger 2)  Lamictal 200 Mg Tabs (Lamotrigine) .... Take one tablet daily *greninger,np 3)  Hydroxyzine Hcl 25 Mg Caps (hydroxyzine Hcl)  .... Take 1 or 2 capsules at bedtime as needed *greninger,np 4)  Trazodone Hcl 100 Mg Tabs (Trazodone hcl) .... Take one tablet at bedtime as needed *greninger, np 5)  Piroxicam 20 Mg Caps (Piroxicam) .[name].. 1 tablet by mouth daily 6)  Flexeril 10 Mg Tabs (Cyclobenzaprine hcl) .[name].. 1 tablet by mouth nightly as needed for spasms 7)  Darvocet-n 100 100-650 Mg Tabs (Propoxyphene n-apap) .[name].. 1 tablet by mouth two times a day as needed for pain 8)  Pravachol 40 Mg Tabs (Pravastatin sodium) .[name].. 1 tablet by mouth at night for cholesterol   Patient Instructions: 1)  This office will refer you to a local Ortho.  You will be referred of the time and date of appointment. 2)  Pt advised that if she gets pain meds from ortho then she will need to inform this office so she will only gets pain meds from one provider. 3)  You are making great  efforts at losing weight.  I am so happy for you.  Please continue. 4)  Follow up in 1  week for fasting labs - lipids, (272.0) 5)  Call for follow up appointment  at end of October.   Prescriptions: FLEXERIL 10 MG  TABS (CYCLOBENZAPRINE HCL) 1 tablet by mouth nightly as needed for spasms  #30 x 0   Entered and Authorized by:   Lehman Prom FNP   Signed by:   Lehman Prom FNP on 09/17/2007   Method used:   Print then Give to Patient   RxID:   [id number] DARVOCET-N 100 100-650 MG  TABS (PROPOXYPHENE N-APAP) 1 tablet by mouth two times a day as needed for pain  #60 x 0   Entered and Authorized by:   Lehman Prom FNP   Signed by:   Lehman Prom FNP on 09/17/2007   Method used:   Print then Give to Patient   RxID:   [id number] PIROXICAM 20 MG  CAPS (PIROXICAM) 1 tablet by mouth daily  #30 x 3   Entered and Authorized by:   Lehman Prom FNP   Signed by:   Lehman Prom FNP on 09/17/2007   Method used:   Print then Give to Patient   RxID:   [id number]  ]

## 2010-03-09 NOTE — Assessment & Plan Note (Signed)
Summary: Left shoulder pain   Vital Signs:  Patient profile:   65 year old female Menstrual status:  postmenopausal Weight:      267.4 pounds BMI:     46.79 BSA:     2.20 Temp:     97.9 degrees F oral Pulse rate:   80 / minute Pulse rhythm:   regular Resp:     20 per minute BP sitting:   164 / 94  (left arm) Cuff size:   large  Vitals Entered ByLevon Hedger (Jul 05, 2009 11:15 AM) CC: 2 month f/u...shoulder pain....left ear irritation, Hypertension Management Is Patient Diabetic? No Pain Assessment Patient in pain? yes     Location: left shoulder,arm Intensity: 10 Onset of pain  Constant, with activity  Does patient need assistance? Functional Status Self care Ambulation Normal   CC:  2 month f/u...shoulder pain....left ear irritation and Hypertension Management.  History of Present Illness:  Pt into the office for routine f/u however she has complaints of left shoulder pain  Left shoulder - started during the first of the month unable to lift the left arm - feels more comfortable in the adduction position. Pain increases with movement. She has been taking vicodin that she has been getting for her left   Obesity - spoke with pt during the last visit and she has started to change her diet Down 6 pounds since her last visit  Hypertension History:      She denies headache, chest pain, and palpitations.  She notes no problems with any antihypertensive medication side effects.        Positive major cardiovascular risk factors include female age 64 years old or older, hyperlipidemia, and hypertension.  Negative major cardiovascular risk factors include no history of diabetes and non-tobacco-user status.        Further assessment for target organ damage reveals no history of ASHD, cardiac end-organ damage (CHF/LVH), stroke/TIA, peripheral vascular disease, renal insufficiency, or hypertensive retinopathy.     Allergies: 1)  ! Duricef 2)  ! Neurontin  Review of  Systems CV:  Denies chest pain or discomfort. Resp:  Denies cough. GI:  Denies abdominal pain, nausea, and vomiting. MS:  Complains of joint pain; left shoulder.  Physical Exam  General:  alert.  obese Head:  normocephalic.   Ears:  bil ears with fluid level - no erythema Lungs:  normal breath sounds.   Heart:  normal rate and regular rhythm.     Shoulder/Elbow Exam  Shoulder Exam:    Left:    Inspection:  Normal    Palpation:  Abnormal       Location:  left infrascapular    Stability:  unstable    Tenderness:  left infrascapular    Swelling:  no    Erythema:  no   Impression & Recommendations:  Problem # 1:  SHOULDER PAIN, LEFT (ICD-719.41)  Left shoulder injected under sterile technique anterior area cleaned and prepped with betadine Depomedrol 80mg  and lidocaine 4cc injected  pt tolerated well Her updated medication list for this problem includes:    Flexeril 10 Mg Tabs (Cyclobenzaprine hcl) .[name]... 1 tablet by mouth nightly as needed for spasms    Vicodin 5-500 Mg Tabs (Hydrocodone-acetaminophen) ..... One tablet by mouth daily as needed for back  Orders: Depo- Medrol 80mg  (J1040) Joint Aspirate / Injection, Intermediate (04540)  Complete Medication List: 1)  Lexapro 10 Mg Tabs (Escitalopram oxalate) .... One tablet by mouth daily ** rx by [facility]** 2)  Lamictal 200 Mg Tabs (Lamotrigine) .... Take one tablet daily *greninger,np 3)  Trazodone Hcl 100 Mg Tabs (Trazodone hcl) .... Take one tablet at bedtime as needed *greninger, np 4)  Flexeril 10 Mg Tabs (Cyclobenzaprine hcl) .[name].. 1 tablet by mouth nightly as needed for spasms 5)  Fexofenadine Hcl 180 Mg Tabs (Fexofenadine hcl) .... One tablet by mouth daily for allergies **pt has tried and failed loratadine and zyrtec** 6)  Omeprazole 20 Mg Cpdr (Omeprazole) .... One capsule by mouth two times a day before breakfast and dinner 7)  Lisinopril 10 Mg Tabs (Lisinopril) .... One tab by mouth daily for blood  pressure 8)  Levothyroxine Sodium 50 Mcg Tabs (Levothyroxine sodium) .... One tablet by mouth daily for thyroid 9)  Vicodin 5-500 Mg Tabs (Hydrocodone-acetaminophen) .... One tablet by mouth daily as needed for back 10)  Lac-hydrin 12 % Lotn (Ammonium lactate) .... One application topically two times a day as needed to affected area 11)  Mucinex Dm 30-600 Mg Xr12h-tab (Dextromethorphan-guaifenesin) .... One tablet by mouth two times a day as needed for cough and congestion 12)  Sumatriptan Succinate 100 Mg Tabs (Sumatriptan succinate) .... One tablet by mouth at onset of headache, may repeat in 2 hours if headache persists 13)  Antipyrine-benzocaine 5.4-1.4 % Soln (Benzocaine-antipyrine) .... 2 drops in each ear two times a day for pain 14)  Fluticasone Propionate 50 Mcg/act Susp (Fluticasone propionate) .... One spray in each nosril two times a day **hold head down** pharmacy - discontiue nasacort 15)  Mupirocin 2 % Oint (Mupirocin) .... Use to affected area two times a day until healed  Hypertension Assessment/Plan:      The patient's hypertensive risk group is category B: At least one risk factor (excluding diabetes) with no target organ damage.  Her calculated 10 year risk of coronary heart disease is 17 %.  Today's blood pressure is 164/94.  Her blood pressure goal is < 140/90.  Patient Instructions: 1)  Schedule an appointment in 4-6 weeks comlete physical exam. 2)  Will need PAP, colonscopy, lipids 3)  Do not eat after midnight before this visit 4)  If left shoulder is still huting then you will need to get an MRI - of concern will be a torn rotator cuff.  You may need physical therapy. Prescriptions: VICODIN 5-500 MG TABS (HYDROCODONE-ACETAMINOPHEN) One tablet by mouth daily as needed for back  #30 x 0   Entered and Authorized by:   Lehman Prom FNP   Signed by:   Lehman Prom FNP on 07/05/2009   Method used:   Print then Give to Patient   RxID:   [id number] SUMATRIPTAN  SUCCINATE 100 MG TABS (SUMATRIPTAN SUCCINATE) One tablet by mouth at onset of headache, may repeat in 2 hours if headache persists  #10 x 0   Entered and Authorized by:   Lehman Prom FNP   Signed by:   Lehman Prom FNP on 07/05/2009   Method used:   Electronically to        [facility] [phone]* (retail)       [address]       [place], [place]  [address]       Ph: [phone]       Fax: [phone]   RxID:   [id number] MUPIROCIN 2 % OINT (MUPIROCIN) use to affected area two times a day until healed  #30gm x 0   Entered and Authorized by:   Lehman Prom FNP   Signed by:  Lehman Prom FNP on 07/05/2009   Method used:   Electronically to        [facility] [phone]* (retail)       [address]       [place], [place]  [address]       Ph: [phone]       Fax: [phone]   RxID:   [id number] OMEPRAZOLE 20 MG CPDR (OMEPRAZOLE) One capsule by mouth two times a day before breakfast and dinner  #60 x 5   Entered and Authorized by:   Lehman Prom FNP   Signed by:   Lehman Prom FNP on 07/05/2009   Method used:   Electronically to        [facility] [phone]* (retail)       [address]       [place], [place]  [address]       Ph: [phone]       Fax: [phone]   RxID:   [id number]

## 2010-03-09 NOTE — Letter (Signed)
Summary: DR Gae Gallop NOTES  DR ROSENS NOTES   Imported By: Leodis Rains 07/29/2008 15:13:57  _____________________________________________________________________  External Attachment:    Type:   Image     Comment:   External Document

## 2010-03-09 NOTE — Assessment & Plan Note (Signed)
Summary: HTN   Vital Signs:  Patient profile:   65 year old female Menstrual status:  postmenopausal Weight:      264.8 pounds Temp:     97.1 degrees F oral Pulse rate:   84 / minute Pulse rhythm:   regular Resp:     20 per minute BP sitting:   128 / 88  (left arm) Cuff size:   regular  Vitals Entered By: Levon Hedger (December 26, 2009 10:06 AM) CC: follow-up visit, Hypertension Management, Lipid Management Is Patient Diabetic? No Pain Assessment Patient in pain? no       Does patient need assistance? Functional Status Self care Ambulation Normal   CC:  follow-up visit, Hypertension Management, and Lipid Management.  History of Present Illness:  Pt presents today with her medications. She has changed to a mail order pharmacy and she is happy with the price and convience.  Pt is doing well.  She has had eye surgery twice since her last visit and has f/u there December 6th. S/p cataract surgery  Hypertension History:      She denies headache, chest pain, and palpitations.  She notes no problems with any antihypertensive medication side effects.        Positive major cardiovascular risk factors include female age 29 years old or older, hyperlipidemia, and hypertension.  Negative major cardiovascular risk factors include no history of diabetes and non-tobacco-user status.        Further assessment for target organ damage reveals no history of ASHD, cardiac end-organ damage (CHF/LVH), stroke/TIA, peripheral vascular disease, renal insufficiency, or hypertensive retinopathy.    Lipid Management History:      Positive NCEP/ATP III risk factors include female age 82 years old or older and hypertension.  Negative NCEP/ATP III risk factors include no history of early menopause without estrogen hormone replacement, non-diabetic, non-tobacco-user status, no ASHD (atherosclerotic heart disease), no prior stroke/TIA, no peripheral vascular disease, and no history of aortic  aneurysm.        The patient states that she knows about the "Therapeutic Lifestyle Change" diet.  Her compliance with the TLC diet is fair.  The patient does not know about adjunctive measures for cholesterol lowering.  She expresses no side effects from her lipid-lowering medication.  The patient denies any symptoms to suggest myopathy or liver disease.      Medications Prior to Update: 1)  Citalopram Hydrobromide 20 Mg Tabs (Citalopram Hydrobromide) .... One Tablet By Mouth Daily **rx By Greninger** 2)  Lamictal 200 Mg Tabs (Lamotrigine) .... Take One Tablet Daily *greninger,np 3)  Trazodone Hcl 100 Mg  Tabs (Trazodone Hcl) .... Take One Tablet At Bedtime As Needed *greninger, Np 4)  Levocetirizine Dihydrochloride 5 Mg Tabs (Levocetirizine Dihydrochloride) .... One Tablet By Mouth Daily For Allergies 5)  Omeprazole 20 Mg Cpdr (Omeprazole) .... One Capsule By Mouth Two Times A Day Before Breakfast and Dinner 6)  Lisinopril 10 Mg Tabs (Lisinopril) .... One Tab By Mouth Daily For Blood Pressure 7)  Levothyroxine Sodium 50 Mcg Tabs (Levothyroxine Sodium) .... One Tablet By Mouth Daily For Thyroid 8)  Vicodin 5-500 Mg Tabs (Hydrocodone-Acetaminophen) .... One Tablet By Mouth Daily As Needed For Back 9)  Lac-Hydrin 12 % Lotn (Ammonium Lactate) .... One Application Topically Two Times A Day As Needed To Affected Area 10)  Sumatriptan Succinate 100 Mg Tabs (Sumatriptan Succinate) .... One Tablet By Mouth At Onset of Headache, May Repeat in 2 Hours If Headache Persists 11)  Fluticasone Propionate 50 Mcg/act  Susp (Fluticasone Propionate) .... One Spray in Each Nosril Two Times A Day **hold Head Down** Pharmacy - Discontiue Nasacort 12)  Mupirocin 2 % Oint (Mupirocin) .... Use To Affected Area Two Times A Day Until Healed 13)  Pravastatin Sodium 20 Mg Tabs (Pravastatin Sodium) .... One Tablet By Mouth Nightly For Cholesterol  Current Medications (verified): 1)  Citalopram Hydrobromide 20 Mg Tabs  (Citalopram Hydrobromide) .... One Tablet By Mouth Daily **rx By Greninger** 2)  Lamictal 200 Mg Tabs (Lamotrigine) .... Take One Tablet Daily *greninger,np 3)  Trazodone Hcl 100 Mg  Tabs (Trazodone Hcl) .... Take One Tablet At Bedtime As Needed *greninger, Np 4)  Levocetirizine Dihydrochloride 5 Mg Tabs (Levocetirizine Dihydrochloride) .... One Tablet By Mouth Daily For Allergies 5)  Omeprazole 20 Mg Cpdr (Omeprazole) .... One Capsule By Mouth Two Times A Day Before Breakfast and Dinner 6)  Lisinopril 10 Mg Tabs (Lisinopril) .... One Tab By Mouth Daily For Blood Pressure 7)  Levothyroxine Sodium 50 Mcg Tabs (Levothyroxine Sodium) .... One Tablet By Mouth Daily For Thyroid 8)  Vicodin 5-500 Mg Tabs (Hydrocodone-Acetaminophen) .... One Tablet By Mouth Daily As Needed For Back 9)  Lac-Hydrin 12 % Lotn (Ammonium Lactate) .... One Application Topically Two Times A Day As Needed To Affected Area 10)  Sumatriptan Succinate 100 Mg Tabs (Sumatriptan Succinate) .... One Tablet By Mouth At Onset of Headache, May Repeat in 2 Hours If Headache Persists 11)  Fluticasone Propionate 50 Mcg/act Susp (Fluticasone Propionate) .... One Spray in Each Nosril Two Times A Day **hold Head Down** Pharmacy - Discontiue Nasacort 12)  Mupirocin 2 % Oint (Mupirocin) .... Use To Affected Area Two Times A Day Until Healed 13)  Pravastatin Sodium 20 Mg Tabs (Pravastatin Sodium) .... One Tablet By Mouth Nightly For Cholesterol  Allergies (verified): 1)  ! Duricef 2)  ! Neurontin  Review of Systems General:  Denies fever. Eyes:  Complains of blurring; pt has had eye surgery since her last visit in this visit. CV:  Denies chest pain or discomfort. Resp:  Denies cough. GI:  Denies abdominal pain, nausea, and vomiting. MS:  Complains of joint pain and low back pain; intermittent.  Physical Exam  General:  alert.  obese Head:  normocephalic.   Lungs:  normal breath sounds.   Heart:  normal rate and regular rhythm.     Abdomen:  normal bowel sounds.   Neurologic:  alert & oriented X3.   Skin:  color normal.   Psych:  Oriented X3.     Impression & Recommendations:  Problem # 1:  HYPERTENSION, BENIGN ESSENTIAL (ICD-401.1) BP is stable continue current medications Her updated medication list for this problem includes:    Lisinopril 10 Mg Tabs (Lisinopril) ..... 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 One tablet by mouth daily as needed for back  Problem # 4:  ALLERGIC RHINITIS (ICD-477.9) pt has changed pharmacies will order fexofenodine as her new company will cover and it works well for her Her updated medication list for this problem includes:    Fexofenadine Hcl 180 Mg Tabs (Fexofenadine hcl) ..... One tablet by mouth daily for allergy    Fluticasone Propionate 50 Mcg/act Susp (Fluticasone propionate) ..... One spray in each nosril two  times a day **hold head down** pharmacy - discontiue nasacort  Complete Medication List: 1)  Citalopram Hydrobromide 20 Mg Tabs (Citalopram hydrobromide) .... One tablet by mouth daily **rx by greninger** 2)  Lamictal 200 Mg Tabs (Lamotrigine) .... Take one tablet daily *greninger,np 3)  Trazodone Hcl 100 Mg Tabs (Trazodone hcl) .... Take one tablet at bedtime as needed *greninger, np 4)  Fexofenadine Hcl 180 Mg Tabs (Fexofenadine hcl) .... One tablet by mouth daily for allergy 5)  Omeprazole 20 Mg Cpdr (Omeprazole) .... One capsule by mouth two times a day before breakfast and dinner 6)  Lisinopril 10 Mg Tabs (Lisinopril) .... One tab by mouth daily for blood pressure 7)  Levothyroxine Sodium 50 Mcg Tabs (Levothyroxine sodium) .... One tablet by mouth daily for thyroid 8)  Vicodin 5-500 Mg Tabs  (Hydrocodone-acetaminophen) .... One tablet by mouth daily as needed for back 9)  Lac-hydrin 12 % Lotn (Ammonium lactate) .... One application topically two times a day as needed to affected area 10)  Sumatriptan Succinate 100 Mg Tabs (Sumatriptan succinate) .... One tablet by mouth at onset of headache, may repeat in 2 hours if headache persists 11)  Fluticasone Propionate 50 Mcg/act Susp (Fluticasone propionate) .... One spray in each nosril two times a day **hold head down** pharmacy - discontiue nasacort 12)  Mupirocin 2 % Oint (Mupirocin) .... Use to affected area two times a day until healed 13)  Pravastatin Sodium 20 Mg Tabs (Pravastatin sodium) .... One tablet by mouth nightly for cholesterol  Other Orders: Mammogram (Screening) (Mammo)  Hypertension Assessment/Plan:      The patient's hypertensive risk group is category B: At least one risk factor (excluding diabetes) with no target organ damage.  Her calculated 10 year risk of coronary heart disease is 11 %.  Today's blood pressure is 128/88.  Her blood pressure goal is < 140/90.  Lipid Assessment/Plan:      Based on NCEP/ATP III, the patient's risk factor category is "2 or more risk factors and a calculated 10 year CAD risk of < 20%".  The patient's lipid goals are as follows: Total cholesterol goal is 200; LDL cholesterol goal is 130; HDL cholesterol goal is 40; Triglyceride goal is 150.    Patient Instructions: 1)  Follow up in 2 months in January for weight and blood pressure. 2)  Will discuss referral at that time. Prescriptions: FEXOFENADINE HCL 180 MG TABS (FEXOFENADINE HCL) One tablet by mouth daily for allergy  #90 x 0   Entered and Authorized by:   Lehman Prom FNP   Signed by:   Lehman Prom FNP on 12/26/2009   Method used:   Printed then faxed to ...       [facility] (mail-order)             , [place]         Ph: [phone]       Fax: [phone]   RxID:   [id number]    Orders Added: 1)  Est.  Patient Level III [01601] 2)  Mammogram (Screening) [Mammo]

## 2010-03-09 NOTE — Letter (Signed)
Summary: Lipid Letter  [facility]  [address] [place], [place] [address]   Phone: [phone]  Fax: [phone]    05/21/2008  Lindsey Rowe [address] [place], [place]  [address]  Dear Ms. Lindsey Rowe:  We have carefully reviewed your last lipid profile from 05/20/2008 and the results are noted below with a summary of recommendations for lipid management.    Cholesterol:       209     Goal: less than 200   HDL "good" Cholesterol:   45     Goal: greater than 40   LDL "bad" Cholesterol:   132     Goal: less than 130   Triglycerides:       159     Goal: less than 150    Labs done during your recent office visit were normal except your cholesterol was slightly elevated.  No need for medications at this time, however continue to avoid fried fatty foods and slowly increase your activity.      Current Medications: 1)    Lexapro 10 Mg Tabs (Escitalopram oxalate) .... One tablet by mouth daily ** rx by [facility]** 2)    Lamictal 200 Mg Tabs (Lamotrigine) .... Take one tablet daily *greninger,np 3)    Trazodone Hcl 100 Mg  Tabs (Trazodone hcl) .... Take one tablet at bedtime as needed *greninger, np 4)    Flexeril 10 Mg  Tabs (Cyclobenzaprine hcl) .[name].. 1 tablet by mouth nightly as needed for spasms 5)    Loratadine 10 Mg Tabs (Loratadine) .... One tablet by mouth daily for allergies 6)    Selenium Sulf-pyrithione-urea 2.25 % Sham (Selenium sulf-pyrithione-urea) .... One application topically three times a day  If you have any questions, please call. We appreciate being able to work with you.   Sincerely,     [facility] Lehman Prom FNP

## 2010-03-09 NOTE — Assessment & Plan Note (Signed)
Summary: HTN/GERD   Vital Signs:  Patient profile:   65 year old female Weight:      266 pounds BMI:     46.55 BSA:     2.20 Temp:     97.6 degrees F oral Pulse rhythm:   regular BP sitting:   136 / 82  (left arm) Cuff size:   large  Vitals Entered By: Levon Hedger (September 13, 2008 10:03 AM) CC: Pt wants to get nexium, she has been heaving out and having upset stomach....hip pain , Lipid Management, Abdominal Pain, Back Pain, Hypertension Management Is Patient Diabetic? No Pain Assessment Patient in pain? yes     Location: hip  Does patient need assistance? Functional Status Self care Ambulation Normal   CC:  Pt wants to get nexium, she has been heaving out and having upset stomach....hip pain , Lipid Management, Abdominal Pain, Back Pain, and Hypertension Management.  History of Present Illness: Pt into the office for routine f/u  GERD - buying prilosec OTC over the counter but symptoms have continued to occur.  No particular foods make the pain worse. +obesity -tobacco  -ETOH use likes tomatos and red sauces  Back Pain History:      The pain is located in the lower back region and does not radiate below the knees.  She states this is not work related.        Other comments:  No pain at site of previous surgery No problems with bil legs as once was th case flexeril given in the past which helped some but she does not have any more med.    Dyspepsia History:      She has no alarm features of dyspepsia including no history of melena, hematochezia, dysphagia, persistent vomiting, or involuntary weight loss > 5%.  There is a prior history of GERD.  She notes that it has been less than 12 months since the last episode of GERD.  The patient does not have a prior history of documented ulcer disease.  The dominant symptom is heartburn or acid reflux.  An H-2 blocker medication is not currently being taken.  No previous upper endoscopy has been done.    Hypertension  History:      She denies headache, chest pain, and palpitations.  no current medications no bp check since here in office for last visit.        Positive major cardiovascular risk factors include female age 67 years old or older, hyperlipidemia, and hypertension.  Negative major cardiovascular risk factors include no history of diabetes and non-tobacco-user status.        Further assessment for target organ damage reveals no history of ASHD, cardiac end-organ damage (CHF/LVH), stroke/TIA, peripheral vascular disease, renal insufficiency, or hypertensive retinopathy.    Lipid Management History:      Positive NCEP/ATP III risk factors include female age 71 years old or older and hypertension.  Negative NCEP/ATP III risk factors include no history of early menopause without estrogen hormone replacement, non-diabetic, non-tobacco-user status, no ASHD (atherosclerotic heart disease), no prior stroke/TIA, no peripheral vascular disease, and no history of aortic aneurysm.        The patient states that she knows about the "Therapeutic Lifestyle Change" diet.  Her compliance with the TLC diet is fair.  The patient expresses understanding of adjunctive measures for cholesterol lowering.  Adjunctive measures started by the patient include weight reduction.  Comments: No medications at this time Eating fat free food.  pt did get her letters as advised.      Habits & Providers  Alcohol-Tobacco-Diet     Alcohol drinks/day: <1     Alcohol type: wine     Tobacco Status: never  Exercise-Depression-Behavior     Does Patient Exercise: yes     Exercise Counseling: to improve exercise regimen     Type of exercise: walking     Have you felt down or hopeless? no     Have you felt little pleasure in things? no     Depression Counseling: not indicated; screening negative for depression     Drug Use: no     Seat Belt Use: 100     Sun Exposure: occasionally  Comments: heat limits exercise  Allergies  (verified): 1)  ! Duricef 2)  ! Neurontin  Social History: Does Patient Exercise:  yes  Review of Systems CV:  Denies chest pain or discomfort. Resp:  Denies cough. GI:  Complains of indigestion; denies abdominal pain, nausea, and vomiting. MS:  Complains of muscle; lower.  Physical Exam  General:  alert.  obese Head:  normocephalic.   Ears:  ear piercing(s) noted.   Mouth:  missing upper teeth Lungs:  normal breath sounds.   Heart:  normal rate and regular rhythm.   Abdomen:  normal bowel sounds.   Msk:  up to the exam table Neurologic:  alert & oriented X3.     Detailed Back/Spine Exam  Lumbosacral Exam:  Inspection-deformity:    Normal    prior surgery scar Palpation-spinal tenderness:  Abnormal    Location:  L4-L5 Sitting Straight Leg Raise:    Right:  negative    Left:  negative   Impression & Recommendations:  Problem # 1:  ACID REFLUX DISEASE (ICD-530.81) advised pt that she should avoid foods that contribute to reflux will d/c prilosec OTC and start omeprazole Her updated medication list for this problem includes:    Omeprazole 20 Mg Cpdr (Omeprazole) ..... 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 1 tablet by mouth nightly as needed for spasms  Problem # 3:  OBESITY (ICD-278.00)  still advised increased exercise will recheck tsh today Orders: T-TSH ([id number])  Problem # 4:  HYPERTENSION, BENIGN ESSENTIAL (ICD-401.1)  continue monitor BP DASH diet start BP meds  Her updated medication list for this problem includes:    Lisinopril 5 Mg Tabs (Lisinopril) ..... One tablet by mouth daily for blood pressure  Orders: T-TSH ([id number])  Complete Medication List: 1)  Lexapro 10 Mg Tabs (Escitalopram oxalate) .... One tablet  by mouth daily ** rx by [facility]** 2)  Lamictal 200 Mg Tabs (Lamotrigine) .... Take one tablet daily *greninger,np 3)  Trazodone Hcl 100 Mg Tabs (Trazodone hcl) .... Take one tablet at bedtime as needed *greninger, np 4)  Flexeril 10 Mg Tabs (Cyclobenzaprine hcl) .[name].. 1 tablet by mouth nightly as needed for spasms 5)  Loratadine 10 Mg Tabs (Loratadine) .... One tablet by mouth daily for allergies 6)  Nizoral 2 % Sham (Ketoconazole) .... One application to scalp daily, let sit for 5 minutes then rinse. pat hair dry. 7)  Omeprazole 20 Mg Cpdr (Omeprazole) .... One capsule by mouth two times a day before breakfast and dinner 8)  Lisinopril 5 Mg Tabs (Lisinopril) .Marland KitchenMarland KitchenMarland Kitchen  One tablet by mouth daily for blood pressure  Hypertension Assessment/Plan:      The patient's hypertensive risk group is category B: At least one risk factor (excluding diabetes) with no target organ damage.  Her calculated 10 year risk of coronary heart disease is 11 %.  Today's blood pressure is 136/82.  Her blood pressure goal is < 140/90.  Lipid Assessment/Plan:      Based on NCEP/ATP III, the patient's risk factor category is "2 or more risk factors and a calculated 10 year CAD risk of < 20%".  The patient's lipid goals are as follows: Total cholesterol goal is 200; LDL cholesterol goal is 160; HDL cholesterol goal is 40; Triglyceride goal is 150.    Patient Instructions: 1)  Your blood pressure is still elevated. 2)  You will need to start a low dose blood pressure medication. 3)  Follow up in 2 weeks for blood pressure check 4)  Take your blood pressure medications before this visit 5)  Follow up in November with n.martin,fnp for htn, gerd 6)  Come fasting before this visit. You will need lipids Prescriptions: LISINOPRIL 5 MG TABS (LISINOPRIL) One tablet by mouth daily for blood pressure  #30 x 5   Entered and Authorized by:   Lehman Prom FNP   Signed by:   Lehman Prom FNP on 09/13/2008   Method used:    Print then Give to Patient   RxID:   [id number] FLEXERIL 10 MG  TABS (CYCLOBENZAPRINE HCL) 1 tablet by mouth nightly as needed for spasms  #30 x 1   Entered and Authorized by:   Lehman Prom FNP   Signed by:   Lehman Prom FNP on 09/13/2008   Method used:   Print then Give to Patient   RxID:   [id number] OMEPRAZOLE 20 MG CPDR (OMEPRAZOLE) One capsule by mouth two times a day before breakfast and dinner  #60 x 5   Entered and Authorized by:   Lehman Prom FNP   Signed by:   Lehman Prom FNP on 09/13/2008   Method used:   Print then Give to Patient   RxID:   [id number]

## 2010-03-09 NOTE — Progress Notes (Signed)
Summary: NEEDS REFILL ON FLEXERIL  Phone Note Call from Patient Call back at [facility] Phone [phone]   Summary of Call: Lindsey Rowe PT. [facility] [name] IS OUT OF HER FLEXERIL AND NEEDS ANOTHER REFILL. SHE SAYS HER BACK IS IN A LOT OF PAIN. SHE USES [facility] NEXT DOOR TO [place]. Initial call taken by: Leodis Rains,  November 26, 2007 4:30 PM  Follow-up for Phone Call        forward to N. Martin Follow-up by: Levon Hedger,  November 27, 2007 5:34 PM  Additional Follow-up for Phone Call Additional follow up Details #1::        Rx in the basket Additional Follow-up by: Lehman Prom FNP,  November 28, 2007 8:06 AM    Additional Follow-up for Phone Call Additional follow up Details #2::    Pt re-requested on 12/05/07.  Was the Rx I printed and placed in basket on 11/28/07 faxed to pharmacy? If Rx is no longer in basket call [facility] and see if they have the med on file for the pt. (perhaps Rx was faxed prior to pt being notified) If med is available, pt needs to be notified as such so she will not continue to request. Follow-up by: Lehman Prom FNP,  December 08, 2007 8:54 AM  Additional Follow-up for Phone Call Additional follow up Details #3:: Details for Additional Follow-up Action Taken: Spoke with pharmacist medication was filled on 12/01/07 # 30.  Phone note complete Additional Follow-up by: Levon Hedger,  December 12, 2007 3:23 PM    Prescriptions: FLEXERIL 10 MG  TABS (CYCLOBENZAPRINE HCL) 1 tablet by mouth nightly as needed for spasms  #30 x 0   Entered and Authorized by:   Lehman Prom FNP   Signed by:   Lehman Prom FNP on 11/28/2007   Method used:   Printed then faxed to ...       [facility] [address] [facility]* (retail)       [address]       [place], [place]  [address]       Ph: [phone]       Fax: [phone]   RxID:   [id number]

## 2010-03-09 NOTE — Letter (Signed)
Summary: Generic Letter  [facility]  [address] [place], [place] [address]   Phone: [phone]  Fax: [phone]    08/18/2007  [facility] [address] [place], [place]  [address]  Dear Ms. Nies,  This letter is to inform you that you were denied orthopedic referral at [facility].  They instead advise that you work diligently on weight loss, physical therapy (which this office can refer)and conservative therapies such as warm compresses.     Sincerely,      Lehman Prom FNP [facility]

## 2010-03-09 NOTE — Assessment & Plan Note (Signed)
Summary: NEW - lumbar pain   Vital Signs:  Patient Profile:   65 Years Old Female Height:     63.50 inches Weight:      291 pounds BMI:     50.92 BSA:     2.28 Temp:     96.9 degrees F oral Pulse rate:   80 / minute Pulse rhythm:   regular Resp:     20 per minute BP sitting:   160 / 110  (left arm) Cuff size:   large  Pt. in pain?   yes    Location:   back    Intensity:   10  Vitals Entered By: Levon Hedger (May 12, 2007 4:30 PM)              Is Patient Diabetic? No  Does patient need assistance? Ambulation Normal Comments pt brought medications today     Chief Complaint:  new pt/ back issues.  History of Present Illness:  Pt into this office to establish care. no PCP since in [place] since 2007.  She was previously seen in [facility].  Pt is not able to recall name of provider seen at that time.    Daily meds - entered in EMR  Last PAP in 2001.  mammogram in 2001.  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  She had MRI back then and she notes that she had "buldging discs".  Numbness currently in both knee.  Pt has been trying to get her disability.  she recently went for an evaluation.  She notes that she was put through several maneuvers in which caused her much pain.  she is not able to bend at the knee.   Pt did have have x-ray done at the time of visit in sports medicine. she has been in urgent care since that time and as given anti-inflammatory and muscle relaxers for 3 days.  she has been taking ibuprofen as needed since then.  helps only for 1 hourn then pain re-starts. unable to sleep at night because of back.  Not employed at this time.  Pt lives alone and is a widow. She has no children.  dry skin, brittle hair. thin hair. obestiy.  Pt is requesting that her thyroid be checked.     Back Pain History:      The patient's back pain started approximately  02/05/2005.  The pain is located in the lower back region and does radiate below the knees.  She states this is work related.  On a scale of 1-10, she describes the pain as an 8.  She states that she has had a prior history of back pain.  The patient has not had any recent physical therapy for her back pain.  The following makes the back pain better: muscle relaxers.  The following makes the back pain worse: activity, standing for long periods of time.       Current Allergies: ! DURICEF ! NEURONTIN  Past Surgical History:    Appendectomy    Tonsillectomy    Gastric bypass 1981    Sinus surgery    left wrist fusion        Family History:        Father deceased at age 73 with emphysema    Family History Thyroid disease - mother    Family History of Colon CA - paternal aunt  Social  History:    widow    no children    Never Smoked    Alcohol use-yes (social)    Drug use-no   Risk Factors:  Tobacco use:  never Drug use:  no Alcohol use:  yes    Type:  wine Seatbelt use:  100 % Sun Exposure:  occasionally  PAP Smear History:     Date of Last PAP Smear:  02/06/1999    Results:  per pt   Mammogram History:     Date of Last Mammogram:  02/06/1999    Results:  per pt    Review of Systems  CV      Denies chest pain or discomfort.  Resp      Denies cough.  MS      Complains of low back pain.      with radiculopathy down both legs   Physical Exam  General:     well-developed and overweight-appearing.   Head:     normocephalic.   Eyes:     pupils round.   Ears:     R ear normal and L ear normal.   Nose:     no external deformity.   Mouth:     teeth missing.   Neck:     supple.   Lungs:     normal breath sounds.   Heart:     normal rate and regular rhythm.   Abdomen:     soft, non-tender, and normal bowel sounds.   Msk:     up to exam table but after 2 attempts - needs props from door and table  Extremities:     trace left pedal edema and trace  right pedal edema.   Neurologic:     alert & oriented X3.     Detailed Back/Spine Exam  General:    obese.    Lumbosacral Exam:  Inspection-deformity:    Normal Palpation-spinal tenderness:  Abnormal Sitting Straight Leg Raise:    Right:  positive    Left:  positive     unable to squat or bend to pick up object from the floor    Impression & Recommendations:  Problem # 1:  BACK PAIN, LUMBAR, WITH RADICULOPATHY (ICD-724.4) will schedule pt for MRI will order anti-inflammatory will also give muscle relaxer to use at night Darvocet as needed. Orders: MRI (MRI)  Her updated medication list for this problem includes:    Piroxicam 20 Mg Caps (Piroxicam) .[name]... 1 tablet by mouth daily    Flexeril 10 Mg Tabs (Cyclobenzaprine hcl) .[name]... 1 tablet by mouth nightly as needed for spasms    Darvocet-n 100 100-650 Mg Tabs (Propoxyphene n-apap) .[name]... 1 tablet by mouth two times a day as needed for pain   Problem # 2:  ELEVATED BLOOD PRESSURE WITHOUT DIAGNOSIS OF HYPERTENSION (ICD-796.2) Monitor sodium. If still elevated on next visit she will need meds. Orders: T-General Health Panel (CBCD, CMP, TSH) ([id number]) T-Lipid Profile [phone]) T-HIV Antibody  (Reflex) [id number]) T-Syphilis Test (RPR) ([id number])   Problem # 3:  BIPOLAR AFFECTIVE DISORDER (ICD-296.80) pt still being seen at [facility] center.  will defer treatment to that office  Complete Medication List: 1)  Fluoxetine Hcl 20 Mg Caps (Fluoxetine hcl) .... Take one capsule daily *greninger 2)  Lamictal 200 Mg Tabs (Lamotrigine) .... Take one tablet daily *greninger,np 3)  Hydroxyzine Hcl 25 Mg Caps (hydroxyzine Hcl)  .... Take 1 or 2 capsules at bedtime as needed *greninger,np 4)  Trazodone Hcl 100 Mg Tabs (  Trazodone hcl) .... Take one tablet at bedtime as needed *greninger, np 5)  Piroxicam 20 Mg Caps (Piroxicam) .[name].. 1 tablet by mouth daily 6)  Flexeril 10 Mg Tabs (Cyclobenzaprine hcl) .[name].. 1 tablet by mouth  nightly as needed for spasms 7)  Darvocet-n 100 100-650 Mg Tabs (Propoxyphene n-apap) .[name].. 1 tablet by mouth two times a day as needed for pain   Patient Instructions: 1)  You will be scheduled for MRI. 2)  Take meds as ordered 3)  Follow up in 2-3 weeks (or when appt available) for MRI appt and pain assessment. 4)  You will need complete physical exam in the coming months. 5)  Need to get records from previous provider in [facility] so bring name of this provider to next appt.    Prescriptions: DARVOCET-N 100 100-650 MG  TABS (PROPOXYPHENE N-APAP) 1 tablet by mouth two times a day as needed for pain  #50 x 0   Entered and Authorized by:   Lehman Prom FNP   Signed by:   Lehman Prom FNP on 05/12/2007   Method used:   Print then Give to Patient   RxID:   [id number] FLEXERIL 10 MG  TABS (CYCLOBENZAPRINE HCL) 1 tablet by mouth nightly as needed for spasms  #30 x 0   Entered and Authorized by:   Lehman Prom FNP   Signed by:   Lehman Prom FNP on 05/12/2007   Method used:   Print then Give to Patient   RxID:   [id number] PIROXICAM 20 MG  CAPS (PIROXICAM) 1 tablet by mouth daily  #30 x 3   Entered and Authorized by:   Lehman Prom FNP   Signed by:   Lehman Prom FNP on 05/12/2007   Method used:   Print then Give to Patient   RxID:   [id number]  ]

## 2010-03-09 NOTE — Assessment & Plan Note (Signed)
Summary: Obesity/HTN   Vital Signs:  Patient profile:   65 year old female Menstrual status:  postmenopausal Weight:      271.0 pounds BMI:     47.42 Temp:     97.2 degrees F oral Pulse rate:   72 / minute Pulse rhythm:   regular Resp:     16 per minute BP sitting:   130 / 90  (left arm) Cuff size:   regular  Vitals Entered By: Levon Hedger (October 24, 2009 10:55 AM)  Nutrition Counseling: Patient's BMI is greater than 25 and therefore counseled on weight management options. CC: follow-up visit, Lipid Management, Hypertension Management Is Patient Diabetic? No Pain Assessment Patient in pain? yes     Location: head Intensity: 8 1/2  Does patient need assistance? Functional Status Self care Ambulation Normal  Vision Screening:      Vision Comments: 10/2010   CC:  follow-up visit, Lipid Management, and Hypertension Management.  History of Present Illness:  Pt into the office for f/u on right chest ? sprained muscle. Area has resolved. Pt presents today with all her medications.  Colonscopy - scheduled for September 26th. Pt has the info with the time/date of the appt  Cataract surgery - Scheduled for next week - [facility] Pt has cataracts in both eyes noted on last week by Dr. Clydie Braun  Dermatology - pt was scheduled and her appt was cancelled.  She has been rescheduled for October.  No acute problems today   Diabetes Management History:      She says that she is exercising.  Type of exercise includes: walking.  Duration of exercise is estimated to be <30  She is doing this 3 times per week.    Hypertension History:      She denies headache, chest pain, and palpitations.  She notes no problems with any antihypertensive medication side effects.        Positive major cardiovascular risk factors include female age 43 years old or older, hyperlipidemia, and hypertension.  Negative major cardiovascular risk factors include no history of diabetes and  non-tobacco-user status.        Further assessment for target organ damage reveals no history of ASHD, cardiac end-organ damage (CHF/LVH), stroke/TIA, peripheral vascular disease, renal insufficiency, or hypertensive retinopathy.    Lipid Management History:      Positive NCEP/ATP III risk factors include female age 42 years old or older and hypertension.  Negative NCEP/ATP III risk factors include no history of early menopause without estrogen hormone replacement, non-diabetic, non-tobacco-user status, no ASHD (atherosclerotic heart disease), no prior stroke/TIA, no peripheral vascular disease, and no history of aortic aneurysm.        The patient states that she knows about the "Therapeutic Lifestyle Change" diet.  Her compliance with the TLC diet is fair.  The patient does not know about adjunctive measures for cholesterol lowering.  She expresses no side effects from her lipid-lowering medication.  Comments include: Pt has started cholesterol medications as indicated on last visit.  The patient denies any symptoms to suggest myopathy or liver disease.      Habits & Providers  Alcohol-Tobacco-Diet     Alcohol drinks/day: <1     Alcohol type: wine     Tobacco Status: never  Exercise-Depression-Behavior     Does Patient Exercise: yes     Exercise Counseling: to improve exercise regimen     Type of exercise: walking     Exercise (avg: min/session): <30  Times/week: 3     Depression Counseling: not indicated; screening negative for depression     Drug Use: no     Seat Belt Use: 100     Sun Exposure: occasionally  Medications Prior to Update: 1)  Citalopram Hydrobromide 20 Mg Tabs (Citalopram Hydrobromide) .... One Tablet By Mouth Daily **rx By Greninger** 2)  Lamictal 200 Mg Tabs (Lamotrigine) .... Take One Tablet Daily *greninger,np 3)  Trazodone Hcl 100 Mg  Tabs (Trazodone Hcl) .... Take One Tablet At Bedtime As Needed *greninger, Np 4)  Levocetirizine Dihydrochloride 5 Mg Tabs  (Levocetirizine Dihydrochloride) .... One Tablet By Mouth Daily For Allergies 5)  Omeprazole 20 Mg Cpdr (Omeprazole) .... One Capsule By Mouth Two Times A Day Before Breakfast and Dinner 6)  Lisinopril 10 Mg Tabs (Lisinopril) .... One Tab By Mouth Daily For Blood Pressure 7)  Levothyroxine Sodium 50 Mcg Tabs (Levothyroxine Sodium) .... One Tablet By Mouth Daily For Thyroid 8)  Vicodin 5-500 Mg Tabs (Hydrocodone-Acetaminophen) .... One Tablet By Mouth Daily As Needed For Back 9)  Lac-Hydrin 12 % Lotn (Ammonium Lactate) .... One Application Topically Two Times A Day As Needed To Affected Area 10)  Sumatriptan Succinate 100 Mg Tabs (Sumatriptan Succinate) .... One Tablet By Mouth At Onset of Headache, May Repeat in 2 Hours If Headache Persists 11)  Fluticasone Propionate 50 Mcg/act Susp (Fluticasone Propionate) .... One Spray in Each Nosril Two Times A Day **hold Head Down** Pharmacy - Discontiue Nasacort 12)  Mupirocin 2 % Oint (Mupirocin) .... Use To Affected Area Two Times A Day Until Healed 13)  Pravastatin Sodium 20 Mg Tabs (Pravastatin Sodium) .... One Tablet By Mouth Nightly For Cholesterol  Current Medications (verified): 1)  Citalopram Hydrobromide 20 Mg Tabs (Citalopram Hydrobromide) .... One Tablet By Mouth Daily **rx By Greninger** 2)  Lamictal 200 Mg Tabs (Lamotrigine) .... Take One Tablet Daily *greninger,np 3)  Trazodone Hcl 100 Mg  Tabs (Trazodone Hcl) .... Take One Tablet At Bedtime As Needed *greninger, Np 4)  Levocetirizine Dihydrochloride 5 Mg Tabs (Levocetirizine Dihydrochloride) .... One Tablet By Mouth Daily For Allergies 5)  Omeprazole 20 Mg Cpdr (Omeprazole) .... One Capsule By Mouth Two Times A Day Before Breakfast and Dinner 6)  Lisinopril 10 Mg Tabs (Lisinopril) .... One Tab By Mouth Daily For Blood Pressure 7)  Levothyroxine Sodium 50 Mcg Tabs (Levothyroxine Sodium) .... One Tablet By Mouth Daily For Thyroid 8)  Vicodin 5-500 Mg Tabs (Hydrocodone-Acetaminophen) .... One  Tablet By Mouth Daily As Needed For Back 9)  Lac-Hydrin 12 % Lotn (Ammonium Lactate) .... One Application Topically Two Times A Day As Needed To Affected Area 10)  Sumatriptan Succinate 100 Mg Tabs (Sumatriptan Succinate) .... One Tablet By Mouth At Onset of Headache, May Repeat in 2 Hours If Headache Persists 11)  Fluticasone Propionate 50 Mcg/act Susp (Fluticasone Propionate) .... One Spray in Each Nosril Two Times A Day **hold Head Down** Pharmacy - Discontiue Nasacort 12)  Mupirocin 2 % Oint (Mupirocin) .... Use To Affected Area Two Times A Day Until Healed 13)  Pravastatin Sodium 20 Mg Tabs (Pravastatin Sodium) .... One Tablet By Mouth Nightly For Cholesterol  Allergies (verified): 1)  ! Duricef 2)  ! Neurontin  Review of Systems General:  Denies fever. ENT:  Complains of nasal congestion and sinus pressure; denies earache; new allergy medication has not been effective. CV:  Denies chest pain or discomfort. Resp:  Denies cough. GI:  Denies abdominal pain, nausea, and vomiting.  Physical Exam  General:  alert.  obesity Head:  normocephalic.   Ears:  bil TM with bony landmarks present right with minimal cerumen  no erythema Lungs:  normal breath sounds.   Heart:  normal rate and regular rhythm.   Abdomen:  normal bowel sounds.   Msk:  up to the exam table Neurologic:  alert & oriented X3.   Skin:  color normal.   Psych:  Oriented X3.    Diabetes Management Exam:    Eye Exam:       Eye Exam done elsewhere          Date: 10/17/2009          Results: cataracts          Done by: Dr. Clydie Braun   Impression & Recommendations:  Problem # 1:  HYPERTENSION, BENIGN ESSENTIAL (ICD-401.1) BP  is stable today. Her updated medication list for this problem includes:    Lisinopril 10 Mg Tabs (Lisinopril) ..... One tab by mouth daily for blood pressure  Problem # 2:  HYPERCHOLESTEROLEMIA (ICD-272.0) will recheck on next visit. Her updated medication list for this problem includes:     Pravastatin Sodium 20 Mg Tabs (Pravastatin sodium) ..... One tablet by mouth nightly for cholesterol  Problem # 3:  OBESITY (ICD-278.00) pt would like bariatric referral obesity for most of her life she has tried weight loss efforts such as diet and exercise.  Problem # 4:  SKIN LESION (ICD-709.9) pt has a dermatology referral   Complete Medication List: 1)  Citalopram Hydrobromide 20 Mg Tabs (Citalopram hydrobromide) .... One tablet by mouth daily **rx by greninger** 2)  Lamictal 200 Mg Tabs (Lamotrigine) .... Take one tablet daily *greninger,np 3)  Trazodone Hcl 100 Mg Tabs (Trazodone hcl) .... Take one tablet at bedtime as needed *greninger, np 4)  Levocetirizine Dihydrochloride 5 Mg Tabs (Levocetirizine dihydrochloride) .... One tablet by mouth daily for allergies 5)  Omeprazole 20 Mg Cpdr (Omeprazole) .... One capsule by mouth two times a day before breakfast and dinner 6)  Lisinopril 10 Mg Tabs (Lisinopril) .... One tab by mouth daily for blood pressure 7)  Levothyroxine Sodium 50 Mcg Tabs (Levothyroxine sodium) .... One tablet by mouth daily for thyroid 8)  Vicodin 5-500 Mg Tabs (Hydrocodone-acetaminophen) .... One tablet by mouth daily as needed for back 9)  Lac-hydrin 12 % Lotn (Ammonium lactate) .... One application topically two times a day as needed to affected area 10)  Sumatriptan Succinate 100 Mg Tabs (Sumatriptan succinate) .... One tablet by mouth at onset of headache, may repeat in 2 hours if headache persists 11)  Fluticasone Propionate 50 Mcg/act Susp (Fluticasone propionate) .... One spray in each nosril two times a day **hold head down** pharmacy - discontiue nasacort 12)  Mupirocin 2 % Oint (Mupirocin) .... Use to affected area two times a day until healed 13)  Pravastatin Sodium 20 Mg Tabs (Pravastatin sodium) .... One tablet by mouth nightly for cholesterol  Diabetes Management Assessment/Plan:      The following lipid goals have been established for the patient:  Total cholesterol goal of 200; LDL cholesterol goal of 130; HDL cholesterol goal of 40; Triglyceride goal of 150.  Her blood pressure goal is < 140/90.    Hypertension Assessment/Plan:      The patient's hypertensive risk group is category B: At least one risk factor (excluding diabetes) with no target organ damage.  Her calculated 10 year risk of coronary heart disease is 15 %.  Today's blood pressure is 130/90.  Her  blood pressure goal is < 140/90.  Lipid Assessment/Plan:      Based on NCEP/ATP III, the patient's risk factor category is "2 or more risk factors and a calculated 10 year CAD risk of < 20%".  The patient's lipid goals are as follows: Total cholesterol goal is 200; LDL cholesterol goal is 130; HDL cholesterol goal is 40; Triglyceride goal is 150.    Patient Instructions: 1)  Keep all your appointments during the month of October. 2)  Follow up in 2 months with n.martin,fnp for weight and blood pressure.

## 2010-03-09 NOTE — Letter (Signed)
Summary: *HSN Results Follow up  [facility]  [address] [place], [place] [address]   Phone: [phone]  Fax: [phone]      12/28/2008   LOETTA CONNELLEY [address] [place], [place]  [address]   Dear  Ms. [facility] Schmelter,                            ____S.Drinkard,FNP   ____D. Gore,FNP       ____B. McPherson,MD   ____V. Rankins,MD    ____E. Mulberry,MD    __X__N. Daphine Deutscher, FNP  ____D. Reche Dixon, MD    ____K. Philipp Deputy, MD    ____Other     This letter is to inform you that your recent test(s):  _______Pap Smear    ___X____Lab Test     _______X-ray    ___X____ is within acceptable limits  _______ requires a medication change  _______ requires a follow-up lab visit  _______ requires a follow-up visit with your provider   Comments: Labs done during recent office visit all ok.  Continue your current dose of thyroid medications.       _________________________________________________________ If you have any questions, please contact our office [phone].                    Sincerely,    Lehman Prom FNP [facility]

## 2010-03-09 NOTE — Assessment & Plan Note (Signed)
Summary: Allergic Rhinitis   Vital Signs:  Patient profile:   65 year old female Height:      63.50 inches Weight:      264 pounds BMI:     46.20 BSA:     2.19 Temp:     97.8 degrees F oral Pulse rate:   60 / minute Pulse rhythm:   regular Resp:     16 per minute BP sitting:   130 / 90  (left arm)  Vitals Entered By: Levon Hedger (May 20, 2008 2:37 PM) CC: pain in upper hip feels as if the muscles in hip are spasming, Lipid Management, Back Pain Is Patient Diabetic? No Pain Assessment Patient in pain? yes     Location: UPPER HIP  Does patient need assistance? Functional Status Self care Ambulation Normal   History of Present Illness:  Pt into the office for f/u. Back surgery on 01/20/2008.  Done at [facility] (No records in EMR) - Dr. Harlow Ohms Pt is doing well with the surgery.  Low back pain - ? spasms in her upper buttocks.  Arthritis - Pt is requesting some meds for arthritis.  She is also asking is she can take aleve. multiple levels in her back.  She does not want to take any more prescription meds.  Allergies - watery, itchy eyes, sneezing she has been taking some OTC meds which have likely raised her BP  Obesity - lost 10 pounds since her last visit. she has been trying to slowly increase her activity since her back surgery She has plans to join the [facility] for water aerobics  Back Pain History:      The patient's back pain has been present for > 6 weeks.  The patient has had a recent course of physical therapy.    Critical Exclusionary Diagnosis Criteria (CEDC) for Back Pain:      She notes a prior history of spinal surgery.    Lipid Management History:      Positive NCEP/ATP III risk factors include female age 3 years old or older.  Negative NCEP/ATP III risk factors include non-tobacco-user status, non-hypertensive, no ASHD (atherosclerotic heart disease), no prior stroke/TIA, no peripheral vascular disease, and no history of aortic aneurysm.       Medications Prior to Update: 1)  Fluoxetine Hcl 20 Mg  Caps (Fluoxetine Hcl) .... Take One Capsule Daily *greninger 2)  Lamictal 200 Mg Tabs (Lamotrigine) .... Take One Tablet Daily *greninger,np 3)  Hydroxyzine Hcl 25 Mg  Caps (Hydroxyzine Hcl) .... Take 1 or 2 Capsules At Bedtime As Needed *greninger,np 4)  Trazodone Hcl 100 Mg  Tabs (Trazodone Hcl) .... Take One Tablet At Bedtime As Needed *greninger, Np 5)  Piroxicam 20 Mg  Caps (Piroxicam) .[name].. 1 Tablet By Mouth Daily 6)  Flexeril 10 Mg  Tabs (Cyclobenzaprine Hcl) .[name].. 1 Tablet By Mouth Nightly As Needed For Spasms 7)  Darvocet-N 100 100-650 Mg  Tabs (Propoxyphene N-Apap) .[name].. 1 Tablet By Mouth Two Times A Day As Needed For Pain 8)  Pravachol 40 Mg  Tabs (Pravastatin Sodium) .[name].. 1 Tablet By Mouth At Night For Cholesterol 9)  Bactroban 2 % Oint (Mupirocin) .... One Application Topically To Affected Area 10)  Keflex 500 Mg Caps (Cephalexin) .[name].. 1 Capsule By Mouth Three Times A Day 11)  Norco 5-325 Mg Tabs (Hydrocodone-Acetaminophen) .... Rx By Specialist  Allergies (verified): 1)  ! Duricef 2)  ! Neurontin  Past History:  Past Surgical History:    Appendectomy  Tonsillectomy    Gastric bypass 1981    Sinus surgery    left wrist fusion     L2,3,4,5 laminectomy, decompression of the thecal sac, foraminotomy, posterolateral arthrodesis with autograft from L2-S1 01-20-2008  Review of Systems Eyes:  Complains of itching. ENT:  Complains of nasal congestion. MS:  Complains of muscle aches; low back spasms. Allergy:  Complains of itching eyes and sneezing.  Physical Exam  General:  alert.  obese Head:  normocephalic.   some folliculitis - chronic Eyes:  glasses Ears:  ear piercing(s) noted.   Lungs:  normal breath sounds.   Heart:  normal rate and regular rhythm.   Abdomen:  soft, non-tender, and normal bowel sounds.   Msk:  up to the exam table with assist Neurologic:  alert & oriented X3.     Impression &  Recommendations:  Problem # 1:  ALLERGIC RHINITIS (ICD-477.9)  Her updated medication list for this problem includes:    Loratadine 10 Mg Tabs (Loratadine) ..... One tablet by mouth daily for allergies  Problem # 2:  HYPERCHOLESTEROLEMIA (ICD-272.0)  Pt reports that she is not taking meds.  The following medications were removed from the medication list:    Pravachol 40 Mg Tabs (Pravastatin sodium) .[name]... 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 some muscle spasms - will give flexeril as needed  pt is s/p surgery. The following medications were removed from the medication list:    Piroxicam 20 Mg Caps (Piroxicam) .[name]... 1 tablet by mouth daily    Darvocet-n 100 100-650 Mg Tabs (Propoxyphene n-apap) .[name]... 1 tablet by mouth two times a day as needed for pain    Norco 5-325 Mg Tabs (Hydrocodone-acetaminophen) .[name]... Rx by specialist Her updated medication list for this problem includes:    Flexeril 10 Mg Tabs (Cyclobenzaprine hcl) .[name]... 1 tablet by mouth nightly as needed for spasms  Complete Medication List: 1)  Lexapro 10 Mg Tabs (Escitalopram oxalate) .... One tablet by mouth daily ** rx by [facility]** 2)  Lamictal 200 Mg Tabs (Lamotrigine) .... Take one tablet daily *greninger,np 3)  Trazodone Hcl 100 Mg Tabs (Trazodone hcl) .... Take one tablet at bedtime as needed *greninger, np 4)  Flexeril 10 Mg Tabs (Cyclobenzaprine hcl) .[name].. 1 tablet by mouth nightly as needed for spasms 5)  Loratadine 10 Mg Tabs (Loratadine) .... One tablet by mouth daily for allergies 6)  Selenium Sulf-pyrithione-urea 2.25 % Sham (Selenium sulf-pyrithione-urea) .... One application topically three times a day  Lipid  Assessment/Plan:      Based on NCEP/ATP III, the patient's risk factor category is "0-1 risk factors".  From this information, the patient's calculated lipid goals are as follows: Total cholesterol goal is 200; LDL cholesterol goal is 160; HDL cholesterol goal is 40; Triglyceride goal is 150.    Patient Instructions: 1)  You will be notified of any abnormal labs. 2)  Follow up as needed Prescriptions: SELENIUM SULF-PYRITHIONE-UREA 2.25 % SHAM (SELENIUM SULF-PYRITHIONE-UREA) One application topically three times a day  #137ml x 1   Entered and Authorized by:   Lehman Prom FNP   Signed by:   Lehman Prom FNP on 05/20/2008   Method used:   Print then Give to Patient   RxID:   [id number] LORATADINE 10  MG TABS (LORATADINE) One tablet by mouth daily for allergies  #30 x 3   Entered and Authorized by:   Lehman Prom FNP   Signed by:   Lehman Prom FNP on 05/20/2008   Method used:   Print then Give to Patient   RxID:   [id number] FLEXERIL 10 MG  TABS (CYCLOBENZAPRINE HCL) 1 tablet by mouth nightly as needed for spasms  #30 x 0   Entered and Authorized by:   Lehman Prom FNP   Signed by:   Lehman Prom FNP on 05/20/2008   Method used:   Print then Give to Patient   RxID:   [id number]

## 2010-03-09 NOTE — Letter (Signed)
Summary: TEST ORDER FORM/MAMMOGRAM  TEST ORDER FORM/MAMMOGRAM   Imported By: Arta Bruce 12/07/2008 12:46:56  _____________________________________________________________________  External Attachment:    Type:   Image     Comment:   External Document

## 2010-03-09 NOTE — Letter (Signed)
Summary: Generic Letter  [facility]  [address] [place], [place] [address]   Phone: [phone]  Fax: [phone]    04/04/2009  [facility] 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  Given the chronic nature of this problem it is very important that she have her carpent cleaned as this can be a source of irritants.  Also her vent filters need to be changed at regular intervals.  I have also questioned [name] about mold and she reports none but it would be helpful if you can have someone check just to be sure.     Sincerely,    Lehman Prom FNP [facility]

## 2010-03-09 NOTE — Progress Notes (Signed)
Summary: COULDN'T FIT MRI MACHINE  Phone Note Call from Patient   Summary of Call: Lindsey Rowe. WENT TO HAVE HER MRI TODAY AND SHE COULDN'T FIT BECAUSE HER SHOULDERS WERE TO WIDE, THEY SUGGESTED THE PLACE ON [address], BUT SHE DOESN'T KNOW IF THEY WILL SEE OUR PATIENTS.  SHE SAYS THAT SHE IS ALMOST OUT OF HER FELXERIL AND DARVOCET AND SHE USES [facility] ON [address] Initial call taken by: Leodis Rains,  June 04, 2007 4:18 PM         Appended Document: COULDN'T FIT MRI MACHINE    Phone Note Call from Patient Call back at [facility] Phone [phone]   Caller: Patient Summary of Call: Lindsey Rowe:   WENT TO HAVE HER MRI TODAY AND Rowe COULDN'T FIT BECAUSE HER SHOULDERS WERE TOO WIDE.  THEY SUGGESTED THE PLACE ON [address], BUT SHE DOESN'T KNOW IF THEY WILL SEE OUR PTS.  SHE ALSO STATES THAT SHE IS ALMOST OUT OF HER FLEXIRIL AND DARVOCET AND SHE USES [facility] ON [address]. Initial call taken by: Leodis Rains,  June 04, 2007 4:18PM  Follow-up for Phone Call        Call to see if the open MRI on [address] will accept our pts.  Meds due on 06/11/07.  she can re-request at the beginning of next week. Follow-up by: Lehman Prom FNP,  June 04, 2007 4:47 PM  Additional Follow-up for Phone Call Additional follow up Details #1::        I called [facility] on [address] and they do except our patients. The weight limit is 440 lbs. Additional Follow-up by: Levon Hedger,  Jun 20, 2007 3:50 PM    Additional Follow-up for Phone Call Additional follow up Details #2::    ok to scheduled on [address].   Refill for pain meds printed and put in basket. notify Rowe. Follow-up by: Lehman Prom FNP,  Jun 20, 2007 5:47 PM  Additional Follow-up for Phone Call Additional follow up Details #3:: Details for Additional Follow-up Action Taken: called Rowe left message on machine for Rowe to return call to the office.  Appt is set for 07/02/07 @ 3:30pm [facility] [address] [place].  Rowe is  aware Additional Follow-up by: Levon Hedger,  Jun 24, 2007 12:11 PM    Prescriptions: DARVOCET-N 100 100-650 MG  TABS (PROPOXYPHENE N-APAP) 1 tablet by mouth two times a day as needed for pain  #50 x 0   Entered and Authorized by:   Lehman Prom FNP   Signed by:   Lehman Prom FNP on 06/20/2007   Method used:   Printed then faxed to ...       [address]*       [address]       [place], [place]  [address]       Ph: [phone]       Fax: [phone]   RxID:   [id number] FLEXERIL 10 MG  TABS (CYCLOBENZAPRINE HCL) 1 tablet by mouth nightly as needed for spasms  #30 x 0   Entered and Authorized by:   Lehman Prom FNP   Signed by:   Lehman Prom FNP on 06/20/2007   Method used:   Printed then faxed to ...       [address]       [address]       [place], [place]  [address]       Ph: [phone]       Fax: [phone]  RxID:   [id number]

## 2010-03-09 NOTE — Assessment & Plan Note (Signed)
Summary: Back pain   Vital Signs:  Patient Profile:   65 Years Old Female Height:     63.50 inches Weight:      275 pounds BMI:     48.12 BSA:     2.23 Temp:     96.6 degrees F oral Pulse rate:   80 / minute Pulse rhythm:   regular Resp:     20 per minute BP sitting:   126 / 80  (left arm) Cuff size:   large  Pt. in pain?   yes    Location:   back    Intensity:   9  Vitals Entered By: Levon Hedger (July 18, 2007 3:06 PM)              Is Patient Diabetic? No  Does patient need assistance? Ambulation Normal Comments pt brought medications today     Chief Complaint:  Back Pain.  History of Present Illness:  Pt into the office for continued back pain.  s/p MRI after last visit.  she is here today for the results. She has been taking aleve OTC as she has completed her supply of pain meds as ordered on last visit.  Aleve helps with the pain for about 2-3 hours then she has to take more. See previous H&P   cholesterol - no current meds.  lipids reviewed with pt from last visit.  She agrees that she needs to start meds.  Obesity - spoke with pt on last visit about the need to lose weight. she has lost 21 pounds since last visit.  Pt has modified diet as she is not able to do much physical activity.  though she would like to be able to do more exercises    Back Pain History:      The patient's back pain has been present for > 6 weeks.  The pain is located in the lower back region and does not radiate below the knees.  On a scale of 1-10, she describes the pain as an 8.  She states that she has had a prior history of back pain.  The patient has not had any recent physical therapy for her back pain.    Critical Exclusionary Diagnosis Criteria (CEDC) for Back Pain:      The patient denies a history of previous trauma.  She has no prior history of spinal surgery.  There are no symptoms to suggest infection, cauda equina, or psychosocial factors for back pain.  Cancer risk  factors include no improvement in low back pain after 4-6 weeks therapy.  Other positive CEDC factors include low back pain worse with activity.       Updated Prior Medication List: FLUOXETINE HCL 20 MG  CAPS (FLUOXETINE HCL) take one capsule daily *Greninger LAMICTAL 200 MG TABS (LAMOTRIGINE) Take one tablet daily *Greninger,NP * HYDROXYZINE HCL 25 MG  CAPS (HYDROXYZINE HCL) Take 1 or 2 capsules at bedtime as needed *Greninger,NP TRAZODONE HCL 100 MG  TABS (TRAZODONE HCL) take one tablet at bedtime as needed *Greninger, NP PIROXICAM 20 MG  CAPS (PIROXICAM) 1 tablet by mouth daily FLEXERIL 10 MG  TABS (CYCLOBENZAPRINE HCL) 1 tablet by mouth nightly as needed for spasms DARVOCET-N 100 100-650 MG  TABS (PROPOXYPHENE N-APAP) 1 tablet by mouth two times a day as needed for pain LEXAPRO 10 MG TABS (ESCITALOPRAM OXALATE) Take 1 tab by mouth daily *Greniger, NP  Current Allergies (reviewed today): ! DURICEF ! NEURONTIN    Risk Factors: Tobacco use:  never Drug use:  no Alcohol use:  yes    Type:  wine Seatbelt use:  100 % Sun Exposure:  occasionally  Mammogram History:    Date of Last Mammogram:  02/06/1999  PAP Smear History:    Date of Last PAP Smear:  02/06/1999   Review of Systems  MS      Complains of low back pain.   Physical Exam  General:     alert and overweight-appearing.   Head:     normocephalic.   Lungs:     normal breath sounds.   Heart:     normal rate and regular rhythm.   Abdomen:     soft, non-tender, and normal bowel sounds.   Neurologic:     slow mobility Psych:     Oriented X3.  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 She would like ortho referral. will see  if pt can get into [facility] Fellow program Will refill pain meds. Her updated medication list for this problem includes:    Piroxicam 20 Mg Caps (Piroxicam) .[name]... 1 tablet by mouth daily    Flexeril 10 Mg Tabs (Cyclobenzaprine hcl) .[name]... 1 tablet by mouth nightly as needed for spasms    Darvocet-n 100 100-650 Mg Tabs (Propoxyphene n-apap) .[name]... 1 tablet by mouth two times a day as needed for pain  Orders: Orthopedic Referral (Ortho)   Problem # 2:  OBESITY (ICD-278.00) Pt has lost 21 pounds since her last visit.  She is really trying to lose weight.  She is asking about exercise that she can do that will not further her back.  Problem # 3:  ELEVATED BLOOD PRESSURE WITHOUT DIAGNOSIS OF HYPERTENSION (ICD-796.2) blood pressure is stable.  no current meds.  Problem # 4:  HYPERCHOLESTEROLEMIA (ICD-272.0) Need to start cholesterol meds. labs reviewed with pt Her updated medication list for this problem includes:    Pravachol 40 Mg Tabs (Pravastatin sodium) .[name]... 1 tablet by mouth at night for cholesterol   Complete Medication List: 1)  Fluoxetine Hcl 20 Mg Caps (Fluoxetine hcl) .... Take one capsule daily *greninger 2)  Lamictal 200 Mg Tabs (Lamotrigine) .... Take one tablet daily *greninger,np 3)  Hydroxyzine Hcl 25 Mg Caps (hydroxyzine Hcl)  .... Take 1 or 2 capsules at bedtime as needed *greninger,np 4)  Trazodone Hcl 100 Mg Tabs (Trazodone hcl) .... Take one tablet at bedtime as needed *greninger, np 5)  Piroxicam 20 Mg Caps (Piroxicam) .[name].. 1 tablet by mouth daily 6)  Flexeril 10 Mg Tabs (Cyclobenzaprine hcl) .[name].. 1 tablet by mouth nightly as needed for spasms 7)  Darvocet-n 100 100-650 Mg Tabs (Propoxyphene n-apap) .[name].. 1 tablet by mouth two times a day as needed for pain 8)  Lexapro 10 Mg Tabs (Escitalopram oxalate) .... Take 1 tab by mouth daily *greniger, np 9)  Pravachol 40 Mg Tabs (Pravastatin sodium) .[name].. 1 tablet by mouth at night for cholesterol   Patient Instructions: 1)   This office will refer you to Ortho at [facility] - Fellow program. you will be notified of the time/date  of appointment 2)  You will need to start cholesterol medications. Monitor diet for fatty foods.  Will need to get lipids re-checked in 6-8 weeks. 3)  Congrats on weight loss. You are doing great!!! Keep up the good work. 4)  Follow up here in 2 months (August).   Prescriptions: PRAVACHOL 40 MG  TABS (PRAVASTATIN SODIUM) 1 tablet by mouth at night for cholesterol  #30 x 3   Entered and Authorized by:   Lehman Prom FNP   Signed by:   Lehman Prom FNP on 07/18/2007   Method used:   Print then Give to Patient   RxID:   [id number] FLEXERIL 10 MG  TABS (CYCLOBENZAPRINE HCL) 1 tablet by mouth nightly as needed for spasms  #30 x 0   Entered and Authorized by:   Lehman Prom FNP   Signed by:   Lehman Prom FNP on 07/18/2007   Method used:   Print then Give to Patient   RxID:   [id number] DARVOCET-N 100 100-650 MG  TABS (PROPOXYPHENE N-APAP) 1 tablet by mouth two times a day as needed for pain  #60 x 0   Entered and Authorized by:   Lehman Prom FNP   Signed by:   Lehman Prom FNP on 07/18/2007   Method used:   Print then Give to Patient   RxID:   [id number] DGLOVFI 10 MG TABS (ESCITALOPRAM OXALATE) Take 1 tab by mouth daily *Greniger, NP  #90 x 3   Entered by:   Levon Hedger   Authorized by:   Lehman Prom FNP   Signed by:   Levon Hedger on 07/18/2007   Method used:   Electronically sent to ...       [address]       [address]       [place], [place]  [address]       Ph: [phone]       Fax: [phone]   RxID:   [id number]  ]

## 2010-03-09 NOTE — Assessment & Plan Note (Signed)
Summary: Cough/Headache   Vital Signs:  Patient profile:   65 year old female Menstrual status:  postmenopausal Weight:      261 pounds BMI:     45.67 BSA:     2.18 O2 Sat:      95 % Temp:     98.2 degrees F oral Pulse rate:   98 / minute Pulse rhythm:   regular Resp:     21 per minute BP sitting:   138 / 79  (left arm) Cuff size:   large  Vitals Entered By: Geanie Cooley  (March 29, 2009 12:10 PM) CC: cough and cold x 2 weeks...vision in right eye is worse Is Patient Diabetic? No Pain Assessment Patient in pain? yes     Location: head Intensity: 8  Does patient need assistance? Functional Status Self care Ambulation Normal  Vision Screening:      Vision Comments: 06/2008   CC:  cough and cold x 2 weeks...vision in right eye is worse.  History of Present Illness:  Pt into the office with continued cough and congestion. Recently treated with a course of both prednisone and antibiotics +cough +blurred vision in right eye +dizziness  She has taken delsym, robutussin and nyquil for cough symptoms   Diabetes Management History:      She says that she is exercising.  Type of exercise includes: walking.  Duration of exercise is estimated to be <30  She is doing this 3 times per week.    Habits & Providers  Alcohol-Tobacco-Diet     Alcohol drinks/day: <1     Alcohol type: wine     Tobacco Status: never  Exercise-Depression-Behavior     Does Patient Exercise: yes     Exercise Counseling: to improve exercise regimen     Type of exercise: walking     Exercise (avg: min/session): <30     Times/week: 3     Depression Counseling: not indicated; screening negative for depression     Drug Use: no     Seat Belt Use: 100     Sun Exposure: occasionally  Allergies (verified): 1)  ! Duricef 2)  ! Neurontin  Review of Systems General:  Denies fever. CV:  Denies chest pain or discomfort. Resp:  Complains of cough; denies shortness of breath and  wheezing. GI:  Denies abdominal pain, nausea, and vomiting. Neuro:  Complains of headaches, poor balance, and visual disturbances.  Physical Exam  General:  alert.  obese Head:  normocephalic.   Lungs:  normal breath sounds.   Heart:  normal rate and regular rhythm.   Abdomen:  normal bowel sounds.   Msk:  up to the exam table Neurologic:  alert & oriented X3.    Diabetes Management Exam:    Eye Exam:       Eye Exam done elsewhere          Date: 06/06/2007          Results: normal          Done by: Dr. Harriette Bouillon   Impression & Recommendations:  Problem # 1:  COUGH (ICD-786.2)  Orders: T-BNP  (B Natriuretic Peptide) ([id number]) CXR- 2view (CXR) Pulse Oximetry (single measurment) (94760)  Problem # 2:  HEADACHE (ICD-784.0)  Her updated medication list for this problem includes:    Vicodin 5-500 Mg Tabs (Hydrocodone-acetaminophen) ..... One tablet by mouth daily as needed for back  Complete Medication List: 1)  Lexapro 10 Mg Tabs (Escitalopram oxalate) .... One tablet by mouth  daily ** rx by [facility]** 2)  Lamictal 200 Mg Tabs (Lamotrigine) .... Take one tablet daily *greninger,np 3)  Trazodone Hcl 100 Mg Tabs (Trazodone hcl) .... Take one tablet at bedtime as needed *greninger, np 4)  Flexeril 10 Mg Tabs (Cyclobenzaprine hcl) .[name].. 1 tablet by mouth nightly as needed for spasms 5)  Fexofenadine Hcl 180 Mg Tabs (Fexofenadine hcl) .... One tablet by mouth daily for allergies **pt has tried and failed loratadine and zyrtec** 6)  Omeprazole 20 Mg Cpdr (Omeprazole) .... One capsule by mouth two times a day before breakfast and dinner 7)  Lisinopril 10 Mg Tabs (Lisinopril) .... One tab by mouth daily for blood pressure 8)  Levothyroxine Sodium 50 Mcg Tabs (Levothyroxine sodium) .... One tablet by mouth daily for thyroid 9)  Vicodin 5-500 Mg Tabs (Hydrocodone-acetaminophen) .... One tablet by mouth daily as needed for back 10)  Nasacort Aq 55 Mcg/act Aers (Triamcinolone  acetonide(nasal)) .... One spray in each nostril two times a day  **pharmacy - discontinue the fluticasone** 11)  Meclizine Hcl 25 Mg Tabs (Meclizine hcl) .... One tablet by mouth daily as needed for dizziness 12)  Lac-hydrin 12 % Lotn (Ammonium lactate) .... One application topically two times a day as needed to affected area 13)  Mucinex Dm 30-600 Mg Xr12h-tab (Dextromethorphan-guaifenesin) .... One tablet by mouth two times a day as needed for cough and congestion  Diabetes Management Assessment/Plan:      The following lipid goals have been established for the patient: Total cholesterol goal of 200; LDL cholesterol goal of 130; HDL cholesterol goal of 40; Triglyceride goal of 150.  Her blood pressure goal is < 140/90.    Patient Instructions: 1)  Use Flovent inhaler twice daily.  Rinse mouth after use. 2)  BNP to check for fluid around your heart will be checked today.   3)  Get an x-ray of your lungs to check for fluid and infection in your lungs.  4)  Headache - take aleve daily for headache until next visit.  Eat food before taking 5)  Keep scheduled appointment for next Monday. Prescriptions: MUCINEX DM 30-600 MG XR12H-TAB (DEXTROMETHORPHAN-GUAIFENESIN) One tablet by mouth two times a day as needed for cough and congestion  #20 x 0   Entered and Authorized by:   Lehman Prom FNP   Signed by:   Lehman Prom FNP on 03/29/2009   Method used:   Electronically to        [facility] [phone]* (retail)       [address]       [place], [place]  [address]       Ph: [phone]       Fax: [phone]   RxID:   [id number]

## 2010-03-16 ENCOUNTER — Encounter (INDEPENDENT_AMBULATORY_CARE_PROVIDER_SITE_OTHER): Payer: Self-pay | Admitting: Nurse Practitioner

## 2010-03-16 ENCOUNTER — Encounter: Payer: Self-pay | Admitting: Nurse Practitioner

## 2010-03-16 DIAGNOSIS — S93409A Sprain of unspecified ligament of unspecified ankle, initial encounter: Secondary | ICD-10-CM | POA: Insufficient documentation

## 2010-03-23 ENCOUNTER — Encounter (INDEPENDENT_AMBULATORY_CARE_PROVIDER_SITE_OTHER): Payer: Self-pay | Admitting: Nurse Practitioner

## 2010-03-23 NOTE — Assessment & Plan Note (Signed)
Summary: HTN   Vital Signs:  Patient profile:   65 year old female Menstrual status:  postmenopausal Weight:      267.8 pounds BMI:     46.86 Temp:     97.684 degrees F oral Pulse rate:   84 / minute Pulse rhythm:   regular Resp:     20 per minute BP sitting:   122 / 84  (left arm) Cuff size:   regular  Vitals Entered By: Levon Hedger (March 16, 2010 11:28 AM)  Nutrition Counseling: Patient's BMI is greater than 25 and therefore counseled on weight management options. CC: follow-up visit, Hypertension Management, Lipid Management, Abdominal Pain, Back Pain Is Patient Diabetic? No Pain Assessment Patient in pain? no       Does patient need assistance? Functional Status Self care Ambulation Normal   CC:  follow-up visit, Hypertension Management, Lipid Management, Abdominal Pain, and Back Pain.  History of Present Illness:  Pt into the office for 2 month for blood pressure.  Social - pt looks really well today.  She has a recent hair cut and feels good about it.  Obesity - Weight up a few pounds.  Pt has considered bariatric surgery and she has researched a provider in [facility] but she is not able to travel there.    Back Pain History:      The patient's back pain has been present for > 6 weeks.  The pain is located in the lower back region and does not radiate below the knees.  She states that she has had a prior history of back pain.  The patient has not had any recent physical therapy for her back pain.    Critical Exclusionary Diagnosis Criteria (CEDC) for Back Pain:      The patient denies a history of previous trauma.  She has no prior history of spinal surgery.  There are no symptoms to suggest infection, cancer, cauda equina, or psychosocial factors for back pain.  Other positive CEDC factors include low back pain worse with activity.    Dyspepsia History:      She has no alarm features of dyspepsia including no history of melena, hematochezia,  dysphagia, persistent vomiting, or involuntary weight loss > 5%.  There is a prior history of GERD.  The patient does not have a prior history of documented ulcer disease.  The dominant symptom is not heartburn or acid reflux.  An H-2 blocker medication is not currently being taken.    Hypertension History:      She denies headache, chest pain, and palpitations.  She notes no problems with any antihypertensive medication side effects.        Positive major cardiovascular risk factors include female age 46 years old or older, hyperlipidemia, and hypertension.  Negative major cardiovascular risk factors include no history of diabetes and non-tobacco-user status.        Further assessment for target organ damage reveals no history of ASHD, cardiac end-organ damage (CHF/LVH), stroke/TIA, peripheral vascular disease, renal insufficiency, or hypertensive retinopathy.    Lipid Management History:      Positive NCEP/ATP III risk factors include female age 25 years old or older and hypertension.  Negative NCEP/ATP III risk factors include no history of early menopause without estrogen hormone replacement, non-diabetic, non-tobacco-user status, no ASHD (atherosclerotic heart disease), no prior stroke/TIA, no peripheral vascular disease, and no history of aortic aneurysm.        The patient states that she knows about the "  Therapeutic Lifestyle Change" diet.  Her compliance with the TLC diet is fair.  The patient does not know about adjunctive measures for cholesterol lowering.  She expresses no side effects from her lipid-lowering medication.  The patient denies any symptoms to suggest myopathy or liver disease.        Medications Prior to Update: 1)  Citalopram Hydrobromide 20 Mg Tabs (Citalopram Hydrobromide) .... One Tablet By Mouth Daily **rx By Greninger** 2)  Lamictal 200 Mg Tabs (Lamotrigine) .... Take One Tablet Daily *greninger,np 3)  Trazodone Hcl 100 Mg  Tabs (Trazodone Hcl) .... Take One Tablet  At Bedtime As Needed *greninger, Np 4)  Fexofenadine Hcl 180 Mg Tabs (Fexofenadine Hcl) .... One Tablet By Mouth Daily For Allergy 5)  Omeprazole 20 Mg Cpdr (Omeprazole) .... One Capsule By Mouth Two Times A Day Before Breakfast and Dinner 6)  Lisinopril 10 Mg Tabs (Lisinopril) .... One Tab By Mouth Daily For Blood Pressure 7)  Levothyroxine Sodium 50 Mcg Tabs (Levothyroxine Sodium) .... One Tablet By Mouth Daily For Thyroid 8)  Vicodin 5-500 Mg Tabs (Hydrocodone-Acetaminophen) .... One Tablet By Mouth Daily As Needed For Back 9)  Lac-Hydrin 12 % Lotn (Ammonium Lactate) .... One Application Topically Two Times A Day As Needed To Affected Area 10)  Sumatriptan Succinate 100 Mg Tabs (Sumatriptan Succinate) .... One Tablet By Mouth At Onset of Headache, May Repeat in 2 Hours If Headache Persists 11)  Fluticasone Propionate 50 Mcg/act Susp (Fluticasone Propionate) .... One Spray in Each Nosril Two Times A Day **hold Head Down** Pharmacy - Discontiue Nasacort 12)  Mupirocin 2 % Oint (Mupirocin) .... Use To Affected Area Two Times A Day Until Healed 13)  Pravastatin Sodium 20 Mg Tabs (Pravastatin Sodium) .... One Tablet By Mouth Nightly For Cholesterol  Current Medications (verified): 1)  Citalopram Hydrobromide 20 Mg Tabs (Citalopram Hydrobromide) .... One Tablet By Mouth Daily **rx By Greninger** 2)  Lamictal 200 Mg Tabs (Lamotrigine) .... Take One Tablet Daily *greninger,np 3)  Trazodone Hcl 100 Mg  Tabs (Trazodone Hcl) .... Take One Tablet At Bedtime As Needed *greninger, Np 4)  Fexofenadine Hcl 180 Mg Tabs (Fexofenadine Hcl) .... One Tablet By Mouth Daily For Allergy 5)  Omeprazole 20 Mg Cpdr (Omeprazole) .... One Capsule By Mouth Two Times A Day Before Breakfast and Dinner 6)  Lisinopril 10 Mg Tabs (Lisinopril) .... One Tab By Mouth Daily For Blood Pressure 7)  Levothyroxine Sodium 50 Mcg Tabs (Levothyroxine Sodium) .... One Tablet By Mouth Daily For Thyroid 8)  Vicodin 5-500 Mg Tabs  (Hydrocodone-Acetaminophen) .... One Tablet By Mouth Daily As Needed For Back 9)  Lac-Hydrin 12 % Lotn (Ammonium Lactate) .... One Application Topically Two Times A Day As Needed To Affected Area 10)  Sumatriptan Succinate 100 Mg Tabs (Sumatriptan Succinate) .... One Tablet By Mouth At Onset of Headache, May Repeat in 2 Hours If Headache Persists 11)  Fluticasone Propionate 50 Mcg/act Susp (Fluticasone Propionate) .... One Spray in Each Nosril Two Times A Day **hold Head Down** Pharmacy - Discontiue Nasacort 12)  Mupirocin 2 % Oint (Mupirocin) .... Use To Affected Area Two Times A Day Until Healed 13)  Pravastatin Sodium 20 Mg Tabs (Pravastatin Sodium) .... One Tablet By Mouth Nightly For Cholesterol  Allergies (verified): 1)  ! Duricef 2)  ! Neurontin  Review of Systems General:  Denies fever. CV:  Denies chest pain or discomfort. Resp:  Denies cough. GI:  Denies abdominal pain, nausea, and vomiting. MS:  Complains of muscle; right  ankle sprain on last week - she is wearing a ankle splint that she had from a previous sprain.  she has been taking hydrocodone as needed for that acute injury. Derm:  Complains of lesion(s); all over her body - intermittent due to nerves. Neuro:  Complains of headaches.  Physical Exam  General:  alert.   Head:  normocephalic.   Ears:  ear piercing(s) noted.   Lungs:  normal breath sounds.   Heart:  normal rate and regular rhythm.   Abdomen:  normal bowel sounds.   Neurologic:  alert & oriented X3.   Psych:  Oriented X3.     Impression & Recommendations:  Problem # 1:  HYPERTENSION, BENIGN ESSENTIAL (ICD-401.1) Bp is doing well continue current meds Her updated medication list for this problem includes:    Lisinopril 10 Mg Tabs (Lisinopril) ..... One tab by mouth daily for blood pressure  Problem # 2:  HYPERCHOLESTEROLEMIA (ICD-272.0) conitnue current meds Her updated medication list for this problem includes:    Pravastatin Sodium 20 Mg Tabs  (Pravastatin sodium) ..... 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 One tablet by mouth by mouth daily as needed for ankle Her updated medication list for this problem includes:    Vicodin 5-500 Mg Tabs (Hydrocodone-acetaminophen) ..... One tablet by mouth daily as needed for back  Problem # 5:  ANKLE SPRAIN, RIGHT (ICD-845.00) pt advised to keep support on as currently doing one time rx given of percocet Her updated medication list for this problem includes:    Vicodin 5-500 Mg Tabs (Hydrocodone-acetaminophen) ..... One tablet by mouth daily as needed for back  Complete Medication List: 1)  Citalopram Hydrobromide 20 Mg Tabs (Citalopram hydrobromide) .... One tablet by mouth daily **rx by greninger** 2)  Lamictal 200 Mg Tabs (Lamotrigine) .... Take one tablet daily *greninger,np 3)  Trazodone Hcl 100 Mg Tabs (Trazodone hcl) .... Take one tablet at bedtime as needed *greninger, np 4)  Fexofenadine Hcl 180 Mg Tabs (Fexofenadine hcl) .... One tablet by mouth daily for allergy 5)  Omeprazole 20 Mg Cpdr (Omeprazole) .... One capsule by mouth two times a day before breakfast and dinner 6)  Lisinopril 10 Mg Tabs (Lisinopril) .... One tab by mouth daily for blood pressure 7)  Levothyroxine Sodium 50 Mcg Tabs (Levothyroxine sodium) .... One tablet by mouth daily for thyroid 8)  Vicodin 5-500 Mg Tabs (Hydrocodone-acetaminophen) .... One tablet by mouth daily as needed for back 9)  Lac-hydrin 12 % Lotn (Ammonium lactate) .... One application topically two times a day as needed to affected area 10)  Sumatriptan Succinate 100 Mg Tabs (Sumatriptan succinate) .... One tablet by mouth at onset of headache, may repeat in 2 hours if  headache persists 11)  Fluticasone Propionate 50 Mcg/act Susp (Fluticasone propionate) .... One spray in each nosril two times a day **hold head down** pharmacy - discontiue nasacort 12)  Pravastatin Sodium 20 Mg Tabs (Pravastatin sodium) .... One tablet by mouth nightly for cholesterol  Hypertension Assessment/Plan:      The patient's hypertensive risk group is category B: At least one risk factor (excluding diabetes) with no target organ damage.  Her calculated 10 year risk of coronary heart disease is 11 %.  Today's blood pressure is 122/84.  Her blood pressure goal is < 140/90.  Lipid Assessment/Plan:      Based on NCEP/ATP III, the patient's risk factor category is "0-1 risk factors".  The patient's lipid goals are as follows: Total cholesterol goal is 200; LDL cholesterol goal is 130; HDL cholesterol goal is 40; Triglyceride goal is 150.    Patient Instructions: 1)  Right ankle - keep ankle support on  2)  May apply heat which may help some with inflammation 3)  All refills for your medications will be sent to the pharmacy 4)  Follow up in 2 months for high blood pressure. Prescriptions: OXYCODONE-ACETAMINOPHEN 5-325 MG TABS (OXYCODONE-ACETAMINOPHEN) One tablet by mouth by mouth daily as needed for ankle  #20 x 0   Entered and Authorized by:   Lehman Prom FNP   Signed by:   Lehman Prom FNP on 03/16/2010   Method used:   Print then Give to Patient   RxID:   [id number]    Orders Added: 1)  Est. Patient Level IV [14782]

## 2010-03-29 NOTE — Miscellaneous (Signed)
Summary: [facility]//FAXED  [facility]//FAXED   Imported By: Arta Bruce 03/23/2010 14:59:05  _____________________________________________________________________  External Attachment:    Type:   Image     Comment:   External Document

## 2010-04-14 ENCOUNTER — Telehealth (INDEPENDENT_AMBULATORY_CARE_PROVIDER_SITE_OTHER): Payer: Self-pay | Admitting: Nurse Practitioner

## 2010-04-18 LAB — BASIC METABOLIC PANEL
BUN: 18 mg/dL (ref 6–23)
CO2: 26 mEq/L (ref 19–32)
Calcium: 9.2 mg/dL (ref 8.4–10.5)
Chloride: 104 mEq/L (ref 96–112)
Creatinine, Ser: 1.13 mg/dL (ref 0.4–1.2)
GFR calc Af Amer: 59 mL/min — ABNORMAL LOW (ref >60)
GFR calc non Af Amer: 49 mL/min — ABNORMAL LOW (ref >60)
Glucose, Bld: 103 mg/dL — ABNORMAL HIGH (ref 70–99)
Potassium: 4.3 mEq/L (ref 3.5–5.1)
Sodium: 138 mEq/L (ref 135–145)

## 2010-04-18 LAB — CBC
HCT: 44.5 % (ref 36.0–46.0)
Hemoglobin: 14.4 g/dL (ref 12.0–15.0)
MCH: 33.1 pg (ref 26.0–34.0)
MCHC: 32.4 g/dL (ref 30.0–36.0)
MCV: 102.3 fL — ABNORMAL HIGH (ref 78.0–100.0)
Platelets: 223 10*3/uL (ref 150–400)
RBC: 4.35 MIL/uL (ref 3.87–5.11)
RDW: 12.5 % (ref 11.5–15.5)
WBC: 7.8 10*3/uL (ref 4.0–10.5)

## 2010-04-18 LAB — SURGICAL PCR SCREEN
MRSA, PCR: NEGATIVE
Staphylococcus aureus: NEGATIVE

## 2010-04-25 NOTE — Progress Notes (Signed)
Summary: DID YOU RECEIVE MAIL FROM PATIENT  Phone Note Call from Patient Call back at Home Phone [phone]   Summary of Call: Lindsey Rowe PT. MS Lindsey Rowe CALLED AND WANTS TO KNOW IF Lindsey Rowe RECEIVED SOME INFORMATION SHE MAILED TO YOU IN THE MAIL BEFORE WE LEFT [facility].  I DON'T KNOW WHAT INFO SHE IS TALKING ABOUT. Initial call taken by: Leodis Rains,  April 14, 2010 5:13 PM  Follow-up for Phone Call        I did get the letter - thanks to pt I have not followed up yet but will ... Follow-up by: Lehman Prom FNP,  April 14, 2010 5:22 PM

## 2010-06-20 NOTE — Op Note (Signed)
Lindsey Rowe, Lindsey Rowe               ACCOUNT NO.:  [id number]   MEDICAL RECORD NO.:  [id number]          PATIENT TYPE:  INP   LOCATION:  3172                         FACILITY:  [facility]   PHYSICIAN:  Hilda Lias, M.D.   DATE OF BIRTH:  07/07/1945   DATE OF PROCEDURE:  01/20/2008  DATE OF DISCHARGE:                               OPERATIVE REPORT   PREOPERATIVE DIAGNOSES:  Lumbar stenosis L2 through L5-S1, neurogenic  claudication, degenerative disk disease, and obesity.   POSTOPERATIVE DIAGNOSES:  Lumbar stenosis L2 through L5-S1, neurogenic  claudication, degenerative disk disease, and obesity.   PROCEDURE:  Bilateral L2, L3, L4, and L5 laminectomy, decompression of  the thecal sac, foraminotomy, posterolateral arthrodesis with autograft  from L2-S1.   SURGEON:  Hilda Lias, MD   ASSISTANT:  Cristi Loron, MD   CLINICAL HISTORY:  [name] is a lady who had been complaining of back  pain with radiation to both legs associated with walking.  X-rays showed  severe degenerative disk disease with scoliosis with severe stenosis  from L2 to L5-S1.  The patient wants to proceed with surgery since she  had failed conservative treatment.   PROCEDURE:  The patient was taken to the OR, and she was positioned in a  prone manner.  The back was cleaned with DuraPrep.  We were unable to  feel any bone whatsoever because of her obesity.  A midline incision was  made and then we carried the incision down through the spinous process.  The fascia was detached and retracted laterally.  We __________ the  spinous process, and we proceeded with removal of the spinous process of  L2, L3, L4, and L5.  Then, first x-ray showed we were at the level of L2-  3 and the other lower one at L4-5.  From then on, using the drill as  well as the Leksell, we removed the lamina of L2, L3, L4, and L5.  The  dissection was carried out all the way down to get close to the facet  but without involving the  facet.  The thick yellow ligament was also  excised.  Using the 2 and 3-mm Kerrison punch, we were able to do  foraminotomy to decompress the L2, L3, L4, L5, and S1 nerve root.  At  the end, we had good decompression.  The area was irrigated.  Valsalva  maneuver was negative.  Then, the periosteum in the lateral aspect of  the facet were removed and using her own bone, we did a posterolateral  arthrodesis.  After the irrigation, the wound was closed with Vicryl and  Steri-Strips.           ______________________________  Hilda Lias, M.D.     EB/MEDQ  D:  01/20/2008  T:  01/21/2008  Job:  [id number]

## 2010-06-20 NOTE — Discharge Summary (Signed)
NAMEAVANNA, Rowe               ACCOUNT NO.:  [id number]   MEDICAL RECORD NO.:  [id number]          PATIENT TYPE:  INP   LOCATION:  3037                         FACILITY:  [facility]   PHYSICIAN:  Hilda Lias, M.D.   DATE OF BIRTH:  June 02, 1945   DATE OF ADMISSION:  01/20/2008  DATE OF DISCHARGE:  01/26/2008                               DISCHARGE SUMMARY   ADMISSION DIAGNOSES:  Lumbar stenosis L2 to L5 with neurogenic  claudication, obesity.   FINAL DIAGNOSES:  Lumbar stenosis L2 to L5 with neurogenic claudication,  obesity.   CLINICAL HISTORY:  The patient was admitted because of back pain with  radiation to both the legs.  X-rays showed that she has stenosis from L2  down to L5-S1.  Surgery was advised.   LABORATORY DATA:  Normal.   COURSE IN THE HOSPITAL:  The patient was taken to the surgery and L2 to  L5 laminectomy with posterolateral arthrodesis was done.  The patient  will be quite slow to get around, but today, she is still much better  and she is ready to go home.   CONDITION ON DISCHARGE:  Improving.   MEDICATIONS:  1. Percocet.  2. Flexeril.  She is going to continue her preop medication.   DIET:  She was advised to lose weight.   ACTIVITY:  Not to drive, not to bend.   FOLLOWUP:  To be seen by me in 4 weeks.           ______________________________  Hilda Lias, M.D.     EB/MEDQ  D:  01/26/2008  T:  01/27/2008  Job:  [id number]

## 2010-06-23 NOTE — Op Note (Signed)
[place]. [facility]  Patient:    Lindsey Rowe, Lindsey Rowe Visit Number: [id number] MRN: [id number]          Service Type: DSU Location: [facility] Attending Physician:  Susa Day Dictated by:   Katy Fitch Naaman Plummer., M.D. Proc. 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  POSTOPERATIVE DIAGNOSES: 1. 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 2. 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  PROCEDURE: 1. Removal of intact 4-hole ASIF semitubular plate and removal of broken    8-hole ASIF semitubular plates and the removal of 8 ASIF screws. 2. Resection of scaphocapitate and scapholunate mid carpal fibrous union    followed by autogenous bone grafting. 3. Application of a contoured 6-hole 3.5-mm DCP plate to facilitate    arthrodesis of left mid carpal articulation.  OPERATING SURGEON:  Katy Fitch. Sypher, Montez Hageman., M.D.  ASSISTANT:  Jonni Sanger, P.A.  ANESTHESIA:  General oral tracheal.  SUPERVISING ANESTHESIOLOGIST:  Judie Petit, M.D.  INDICATIONS:  Lindsey Rowe is a 65 year old woman formerly employed by [employer] in [place], [place].  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  Despite efforts at bracing, debridement, and use of anti-inflammatory analgesics, she was unable to function due to chronic pain. We recommended proceeding with arthrodesis of her left wrist with plate fixation and autogenous iliac graft.  This surgery was accomplished on March 12, 2000, under general anesthesia with application of a pair of stacked 4- and 8-hole ASIF semitubular plates.  Postoperatively, she went on to satisfactorily heal her radial carpal, mid carpal and initially, apparently heal her mid carpal joint.  She was discontinued from continuous immobilization approximately 8 weeks post surgery and placed in a removable splint.  She did well during the early summer of 2002; however, in July and August, she began to experience recurrent pain in her wrist.  She returned for a clinical examination in our office and was noted to have a jog of false motion at her mid carpal joint.  Plain films documented that her 8-hole ASIF plate was broken and she appeared to have a radiolucency at her mid carpal joint consistent with a fibrous union.  We recommended a period of casting to see if this would spontaneously heal. This was unsuccessful and her pain persisted.  Therefore, she is returned to the operating room at this time anticipating resection of the nonunion, broken plate removal, repeat grafting, and application of a 3.5-mm dynamic compression plate to try to achieve arthrodesis of her mid carpal joint.  Preoperatively, she was advised of the potential benefits and risks of  surgery.  Specific risks of infection, second development of a nonunion, possible plate or screw breakage, and reflex sympathetic dystrophy were reviewed.  DESCRIPTION OF PROCEDURE:  Matilde Pottenger was brought to the operating room and placed in the supine position upon the operating table.  Following induction of general  anesthesia, 1 gm of Vancomycin was administered as an IV prophylactic antibiotic due to cephalosporin allergy.  The left arm was prepped with Betadine solution and sterilely draped.  A pneumatic tourniquet was applied to the proximal left brachium.  Following exsanguination of the limb with an Esmarch bandage, an arterial tourniquet was placed at 250 mmHg due to mild systolic hypertension.  The procedure commenced with a resection of the previous surgical scar. Subcutaneous tissues were carefully divided taking care to spare the longitudinal veins.  The subcutaneous fat was cleared to reveal the reconstituted extensor retinaculum.  This was step-cut to allow closure anticipating thicker diameter and increased bulk of the DCP plate following a second arthrodesis attempt.  The plate was exposed by elevation of the soft tissues overlying the plate with a periosteal elevator taking care to protect the extensor tendons with blunt retractors.  The 4-hole ASIF plate was intact; however, the deeper 8-hole ASIF plate was fractured directly over the mid carpal joint.  The eight screws and two plates were passed off as a specimen.  A rongeur was used to remove all of the fibrous tissue that had surrounded the plate and the periosteum overlying the dorsal aspect of the radial carpal and mid carpal fusion was elevated revealing a very satisfactory fusion.  There was noted to be false motion through the scaphocapitate and scapholunate articulations.  A house microcurette was used to aggressively remove all the fibrous tissue and to roughen the bony margins on the proximal and distal surfaces of the nonunion site.  A local bone graft was taken due to development of a large mound of bone on the ulnar aspect of the capitate that had formed adjacent to the plate.  This was carefully removed with a rongeur and morcellized.  This very satisfactory cancellous bone was then packed into the nonunion site  with use of a pair of forceps and a blunt tamp.  A 6-hole 3.5-mm DCP plate was then contoured to maintain the wrist in the  position of approximately 20 degrees dorsiflexion and slight ulnar deviation.  This was contoured with a hand press to exactly fit the dimensions of the previous fusion position.  The plate was then replaced with cancellous screws in the radius, carpus, and metacarpal, taking care to redirect the screw holes in the distal radius and the carpus for improved purchase.  The metacarpal screw hole and the two proximal radial screw holes had excellent purchase with the exchange of cortical four cancellous screws.  The periosteum was then repaired over the plate with mattress sutures of 2-0 Ethibond followed by irrigation of the wound and repair of the retinaculum with a mattress suture of 2-0 Ethibond followed by repair of the skin with subcutaneous 3-0 Vicryl and intradermal 3-0 Prolene and Steri-Strips.  AP lateral C-arm images were obtained documenting satisfactory plate contour, good position of the wrist, and obliteration of the mid carpal joint by bone grafting.  Three longitudinal saw cuts were made across the mid carpal joint in addition to the bone graft to stimulate bleeding and further bone healing across the mid carpal joint.  The wound was then dressed with Xeroflow sterile gauze, acrylic fluff, Kerlix and a sugar-tong  splint maintaining the form and supination and the wrist in the preselected position of fusion.  NOTE:  She was transferred to the recovery room with stable vital signs.  She may be admitted to the recovery care center depending on her postoperative pain level.  Orders were given for postoperative admission to the recovery care center.Dictated by:   Katy Fitch Naaman Plummer., M.D. Attending Physician:  Susa Day DD:  10/22/00 TD:  10/22/00 Job: [id number]
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
## 2010-06-23 NOTE — Op Note (Signed)
[place]. [facility]  Patient:    Lindsey Rowe, Lindsey Rowe                        MRN: [id number] Proc. Date: 03/12/00 Adm. Date:  [id number] Attending:  Susa Day CC:         Anesthesia   Operative Report  PREOPERATIVE DIAGNOSIS:  Chronic painful left wrist, status post traumatic development of a scapholunate advanced collapse deformity following a fall on September 26, 1999.  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  OPERATION: 1. Arthrodesis of left wrist, including radiocarpal, mid carpal, index and    long finger carpometacarpal joints with application of an ASIF 8-hole    semitubular plate and a second 4-hole ASIF semitubular plate stacked to    increase the strength across the carpus. 2. Autogenous left anterior iliac crest graft.  NOTE:  Due to morbid obesity, the degree of difficulty of this procedure was extremely difficult.  SURGEON:  Katy Fitch. Sypher, Montez Hageman., M.D.  ASSISTANT:  Jonni Sanger, P.A.-C.  ANESTHESIA:  General orotracheal.  SUPERVISING ANESTHESIOLOGIST:  Dr. Cliffton Asters. Crews.  INDICATIONS:  Lindsey Rowe is a 65 year old woman, formerly employed by [employer].  On September 26, 1999, she fell sustaining a hyperextension injury to her left wrist.  She was evaluated at [facility] and found to have a severely painful wrist and due to a previous experience with our [facility], she requested transfer for evaluation of her wrist injury in [place].  On clinical examination, she had signs of an acute scapholunate diastasis. She had had a previous injury to the right wrist in the past and had a chronic scapholunate diastasis that had responded well to arthroscopic debridement and intermittent support with an orthosis.  We arthroscoped her wrist and found evidence of  significant hyaline articular cartilage damage and did not find that there was any indication to proceed with an attempted scapholunate interosseous ligament repair.  She has had chronic pain unresponsive to activity modification, work modification, therapy and strong analgesics.  Due to her chronic pain, she is brought to the operating room at this time for anticipated arthrodesis of her left wrist.  Plain films in the office had suggested that she was ulnar plus; however, we carefully examine this with the fluoroscope at the time of surgery to determine whether or not she will also require ulnar shortening.  We told her we anticipated use of an autogenous iliac graft and use of a semitubular plate.  DESCRIPTION OF PROCEDURE:  Lindsey Rowe was brought to the operating room and placed in supine position on the operating table.  One g of vancomycin was administered as an IV prophylactic antibiotic.  The left arm was prepped with Betadine soap solution and sterilely draped.  Following exsanguination of the limb with an Esmarch bandage, an arterial tourniquet on the proximal brachium was inflated to 220 mmHg.  The left anterior iliac crest region was prepped with Betadine soap and solution and draped out with towels and Iodoform drape anticipating an iliac crest graft.  Procedure commenced with a 15 cm incision extending from the diaphysis of the long finger metacarpal to the metaphyseal/diaphyseal junction of the radius. Subcutaneous tissues were carefully divided taking care to identify and preserve the longitudinal veins and a transverse dorsal vein was suture ligated.  The extensor retinaculum was  well-visualized, followed by release of the third dorsal compartment.  The second and fourth distal compartments were elevated in a radial and ulnar direction utilizing a razor sharp osteotome performing subperiosteal dissection.  This exposed the distal radius.  Care was taken to  preserve the dorsal radioulnar ligament.  The carpus was exposed through a longitudinal incision, followed by use of the osteotome to elevate the capsule exposing the scaphoid, trapezium, trapezoid, capitate, carpometacarpal joints of the index and long fingers and the capitate/hamate articulation.  With great care, the hyaline articular cartilage surfaces on the adjacent osseous structures of the carpus and carpometacarpal joints were removed with a rongeur, hand curets, a power saw and an osteotome.  Once these were properly sculpted to nestle together, the carpal joints were resected with a small osteotome and the oscillating saw.  Care was taken to remove the articular cartilage between the capitate and the hamate.  The lunate was reduced from a significant DC deformity.  A receiving groove was placed across the base of the index and long finger metacarpals extending across the dorsal aspect of the capitate, trapezoid, scaphoid and lunate and a small rectangular region was carved from the distal radius to accept the bone graft from the iliac crest.  Attention was then directed to the left anterior iliac crest.  A 4 cm incision was fashioned below the line of the crest.  Meticulous dissection through more than 4 inches of subcutaneous fat exposed the outer table of the anterior iliac crest.  Care was taken to palpate the external oblique and tensor fasciae latae muscles and the electrocautery was used to expose the crest of the anterior ilium.  A small 4 x 4 window was created with an osteotome, followed by use of a 4 mm curet to harvest approximately 20 cc of cortical cancellous bone.  This wound was then packed with a sterile sponge and bone graft from the distal radius and bone graft from the carpus was prepared by removal of hyaline articular cartilage.  An 8-hole ASIF plate was bent to place the wrist in 25 degrees of dorsiflexion and neutral radial and ulnar deviation.  The  plate was applied to the distal  radius with anticipating three screws in the radius, three screws in the long metacarpal and two in the carpus.  The proximal and distal screws were placed and the wrist position set.  Due to Ms. Perrys body habitus, I elected to stack a plate across the carpus to increase the strength at the point of maximum bending moment.  Appropriate sized screws were placed with cortical screws in the distal and proximal screw holes and cancellous screws across the carpus and in the metaphyseal regions of the radius and long finger metacarpal.  The capitate was well-reduced, as was the lunate after proper plate application.  The C-arm fluoroscope was used to examine the relationship of the distal ulna to the ulnar aspect of the lunate and triquetrium.  In the wrist position selected, there did not appear to be any significant risk of ulnar carpal abutment, therefore, we elected not to shorten the ulna. The cancellous fragments of the carpus and radius were then cleaned of all hyaline cartilage and packed in the interstitial spaces between the carpometacarpal articulations, the trapezoid, trapezium and distal scaphoid and between the lunate, capitate, scaphoid and lunate and radius.  The iliac graft was then placed in the receiving trough created prior to plate application and the capsule was then repaired with mattress sutures of  0 Vicryl.  An anatomic repair of the extensor retinaculum was achieved with the plate fully covered.  The extensor carpi radialis brevis was transferred to a slightly ulnar position to fully cover the plate and protect the extensor tendons.  After closure of the bone graft site, the wound was thoroughly lavaged with sterile saline to remove all free adipose tissue.  The skin was repaired with subdermal sutures of 2-0 Vicryl and the skin repaired with intradermal 3-0 Prolene suture.  The tourniquet was released prior to skin final  closure.  Bleeding was controlled with bipolar cautery, direct pressure and two vessel loop drains were placed to prevent the collection of a postoperative hematoma in the subcutaneous region.  Immediate capillary refill was noted in the thumb and fingers within three seconds following release of the tourniquet.  The wound was then dressed with Adaptic, sterile gauze, Kerlix, acrylic fluffs to control swelling and a dorsal and palmar plaster sandwich splint with Ace wrap compression.  There were no apparent complications.  Ms. Locurto tolerated her surgery and anesthesia well.  She was transferred to the recovery room with stable vital signs.  Her iliac crest wound was repaired with a mattress suture of 0 Vicryl closing the external oblique, and tensor fasciae latae, followed by repair of the subcutaneous tissues with multiple layers of 2-0 Vicryl and repair of the skin with intradermal 3-0 Prolene and Steri-Strips.  Marcaine 0.25% was infiltrated along both wound margins prior to closure. There were no apparent complications.  The tourniquet time was 1 hour and 50 minutes at 220 mmHg and her estimated blood loss was approximately 100 cc. All sponge and needle counts were correct. DD:  03/12/00 TD:  03/12/00 Job: [id number]

## 2010-09-06 ENCOUNTER — Other Ambulatory Visit: Payer: Self-pay | Admitting: Internal Medicine

## 2010-09-06 DIAGNOSIS — M549 Dorsalgia, unspecified: Secondary | ICD-10-CM

## 2010-09-12 ENCOUNTER — Ambulatory Visit (HOSPITAL_COMMUNITY)
Admission: RE | Admit: 2010-09-12 | Discharge: 2010-09-12 | Disposition: A | Discharge status: Home / Self Care | Payer: Medicare Other | Source: Ambulatory Visit | Attending: Internal Medicine | Admitting: Internal Medicine

## 2010-09-12 DIAGNOSIS — M545 Low back pain, unspecified: Secondary | ICD-10-CM | POA: Insufficient documentation

## 2010-09-12 DIAGNOSIS — M549 Dorsalgia, unspecified: Secondary | ICD-10-CM

## 2010-09-12 DIAGNOSIS — M519 Unspecified thoracic, thoracolumbar and lumbosacral intervertebral disc disorder: Secondary | ICD-10-CM | POA: Insufficient documentation

## 2010-09-12 DIAGNOSIS — M48061 Spinal stenosis, lumbar region without neurogenic claudication: Secondary | ICD-10-CM | POA: Insufficient documentation

## 2010-09-12 DIAGNOSIS — M25559 Pain in unspecified hip: Secondary | ICD-10-CM | POA: Insufficient documentation

## 2010-11-09 LAB — COMPREHENSIVE METABOLIC PANEL
ALT: 10 U/L (ref 0–35)
AST: 17 U/L (ref 0–37)
Albumin: 3.7 g/dL (ref 3.5–5.2)
Alkaline Phosphatase: 102 U/L (ref 39–117)
BUN: 11 mg/dL (ref 6–23)
CO2: 31 mEq/L (ref 19–32)
Calcium: 9.3 mg/dL (ref 8.4–10.5)
Chloride: 99 mEq/L (ref 96–112)
Creatinine, Ser: 0.95 mg/dL (ref 0.4–1.2)
GFR calc Af Amer: >60 mL/min (ref >60)
GFR calc non Af Amer: 60 mL/min — ABNORMAL LOW (ref >60)
Glucose, Bld: 93 mg/dL (ref 70–99)
Potassium: 5.1 mEq/L (ref 3.5–5.1)
Sodium: 137 mEq/L (ref 135–145)
Total Bilirubin: 0.5 mg/dL (ref 0.3–1.2)
Total Protein: 6.2 g/dL (ref 6.0–8.3)

## 2010-11-09 LAB — CBC
HCT: 44.4 % (ref 36.0–46.0)
Hemoglobin: 14.6 g/dL (ref 12.0–15.0)
MCHC: 33 g/dL (ref 30.0–36.0)
MCV: 94.8 fL (ref 78.0–100.0)
Platelets: 281 10*3/uL (ref 150–400)
RBC: 4.68 MIL/uL (ref 3.87–5.11)
RDW: 13.4 % (ref 11.5–15.5)
WBC: 6.8 10*3/uL (ref 4.0–10.5)

## 2010-11-09 LAB — TYPE AND SCREEN
ABO/RH(D): O NEG
Antibody Screen: NEGATIVE

## 2010-11-09 LAB — ABO/RH: ABO/RH(D): O NEG

## 2010-11-14 LAB — URINALYSIS, ROUTINE W REFLEX MICROSCOPIC
Bilirubin Urine: NEGATIVE
Glucose, UA: NEGATIVE
Hgb urine dipstick: NEGATIVE
Ketones, ur: NEGATIVE
Leukocytes, UA: NEGATIVE
Nitrite: NEGATIVE
Protein, ur: 100 — AB
Specific Gravity, Urine: 1.02
Urobilinogen, UA: 0.2
pH: 7.5

## 2010-11-14 LAB — DIFFERENTIAL
Basophils Absolute: 0.1
Basophils Relative: 1
Eosinophils Absolute: 0 — ABNORMAL LOW
Eosinophils Relative: 0
Lymphocytes Relative: 12
Lymphs Abs: 1.2
Monocytes Absolute: 0.5
Monocytes Relative: 5
Neutro Abs: 8.4 — ABNORMAL HIGH
Neutrophils Relative %: 82 — ABNORMAL HIGH

## 2010-11-14 LAB — URINE MICROSCOPIC-ADD ON

## 2010-11-14 LAB — COMPREHENSIVE METABOLIC PANEL
ALT: 14
AST: 23
Albumin: 3.7
Alkaline Phosphatase: 97
BUN: 13
CO2: 29
Calcium: 9
Chloride: 96
Creatinine, Ser: 0.89
GFR calc Af Amer: >60
GFR calc non Af Amer: >60
Glucose, Bld: 157 — ABNORMAL HIGH
Potassium: 4.1
Sodium: 137
Total Bilirubin: 0.9
Total Protein: 6.7

## 2010-11-14 LAB — LIPASE, BLOOD: Lipase: 13

## 2010-11-14 LAB — CBC
HCT: 43.2
Hemoglobin: 14.9
MCHC: 34.5
MCV: 91.6
Platelets: 299
RBC: 4.72
RDW: 13.1
WBC: 10.2

## 2010-11-14 LAB — AMYLASE: Amylase: <5 — ABNORMAL LOW

## 2011-02-12 ENCOUNTER — Emergency Department (HOSPITAL_COMMUNITY): Payer: Medicare Other

## 2011-02-12 ENCOUNTER — Emergency Department (HOSPITAL_COMMUNITY)
Admission: EM | Admit: 2011-02-12 | Discharge: 2011-02-12 | Disposition: A | Discharge status: Home / Self Care | Payer: Medicare Other | Attending: Emergency Medicine | Admitting: Emergency Medicine

## 2011-02-12 DIAGNOSIS — E079 Disorder of thyroid, unspecified: Secondary | ICD-10-CM | POA: Insufficient documentation

## 2011-02-12 DIAGNOSIS — IMO0002 Reserved for concepts with insufficient information to code with codable children: Secondary | ICD-10-CM | POA: Insufficient documentation

## 2011-02-12 DIAGNOSIS — S8000XA Contusion of unspecified knee, initial encounter: Secondary | ICD-10-CM | POA: Insufficient documentation

## 2011-02-12 DIAGNOSIS — F319 Bipolar disorder, unspecified: Secondary | ICD-10-CM | POA: Insufficient documentation

## 2011-02-12 DIAGNOSIS — M25579 Pain in unspecified ankle and joints of unspecified foot: Secondary | ICD-10-CM | POA: Insufficient documentation

## 2011-02-12 DIAGNOSIS — Z7982 Long term (current) use of aspirin: Secondary | ICD-10-CM | POA: Insufficient documentation

## 2011-02-12 DIAGNOSIS — S8010XA Contusion of unspecified lower leg, initial encounter: Secondary | ICD-10-CM | POA: Insufficient documentation

## 2011-02-12 DIAGNOSIS — M549 Dorsalgia, unspecified: Secondary | ICD-10-CM | POA: Insufficient documentation

## 2011-02-12 DIAGNOSIS — I10 Essential (primary) hypertension: Secondary | ICD-10-CM | POA: Insufficient documentation

## 2011-02-12 DIAGNOSIS — M25569 Pain in unspecified knee: Secondary | ICD-10-CM | POA: Insufficient documentation

## 2011-02-12 DIAGNOSIS — G8929 Other chronic pain: Secondary | ICD-10-CM | POA: Insufficient documentation

## 2011-02-12 DIAGNOSIS — T148XXA Other injury of unspecified body region, initial encounter: Secondary | ICD-10-CM

## 2011-02-12 DIAGNOSIS — M25639 Stiffness of unspecified wrist, not elsewhere classified: Secondary | ICD-10-CM | POA: Insufficient documentation

## 2011-02-12 DIAGNOSIS — Z79899 Other long term (current) drug therapy: Secondary | ICD-10-CM | POA: Insufficient documentation

## 2011-02-12 HISTORY — DX: Dorsalgia, unspecified: M54.9

## 2011-02-12 HISTORY — DX: Essential (primary) hypertension: I10

## 2011-02-12 HISTORY — DX: Bipolar disorder, unspecified: F31.9

## 2011-02-12 HISTORY — DX: Disorder of thyroid, unspecified: E07.9

## 2011-02-12 MED ORDER — MORPHINE SULFATE 4 MG/ML IJ SOLN
6.0000 mg | Freq: Once | INTRAMUSCULAR | Status: AC
  Administered 2011-02-12: 6 mg via INTRAVENOUS
  Filled 2011-02-12: qty 2

## 2011-02-12 MED ORDER — HYDROCODONE-ACETAMINOPHEN 5-325 MG PO TABS: 2.0000 | ORAL_TABLET | ORAL | Status: AC | PRN

## 2011-02-12 NOTE — Progress Notes (Signed)
Orthopedic Tech Progress Note Patient Details:  Lindsey Rowe [birth year] [id number]  Other Ortho Devices Type of Ortho Device: ASO Ortho Device Location: right ankle   [name] 02/12/2011, 4:49 PM
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
## 2011-02-12 NOTE — ED Notes (Signed)
No deformities, lacerations noted. Small abrasion to right knee identified, no bleeding present.
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
## 2011-02-17 ENCOUNTER — Emergency Department (INDEPENDENT_AMBULATORY_CARE_PROVIDER_SITE_OTHER)
Admission: EM | Admit: 2011-02-17 | Discharge: 2011-02-17 | Disposition: A | Discharge status: Home / Self Care | Payer: Self-pay | Source: Home / Self Care | Attending: Emergency Medicine | Admitting: Emergency Medicine

## 2011-02-17 ENCOUNTER — Encounter (HOSPITAL_COMMUNITY): Payer: Self-pay | Admitting: *Deleted

## 2011-02-17 DIAGNOSIS — T148XXA Other injury of unspecified body region, initial encounter: Secondary | ICD-10-CM

## 2011-02-17 DIAGNOSIS — S8000XA Contusion of unspecified knee, initial encounter: Secondary | ICD-10-CM

## 2011-02-17 HISTORY — DX: Hyperlipidemia, unspecified: E78.5

## 2011-02-17 MED ORDER — HYDROCODONE-ACETAMINOPHEN 5-325 MG PO TABS: 2.0000 | ORAL_TABLET | ORAL | Status: AC | PRN

## 2011-02-17 MED ORDER — KNEE SLEEVE/KIDS LARGE MISC: 1.0000 mL | Freq: Once | Status: DC
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
## 2011-06-08 ENCOUNTER — Other Ambulatory Visit (HOSPITAL_COMMUNITY): Payer: Self-pay | Admitting: Family Medicine

## 2011-06-08 DIAGNOSIS — Z1231 Encounter for screening mammogram for malignant neoplasm of breast: Secondary | ICD-10-CM

## 2011-07-04 ENCOUNTER — Ambulatory Visit (HOSPITAL_COMMUNITY): Payer: Medicare Other

## 2011-07-26 ENCOUNTER — Ambulatory Visit (HOSPITAL_COMMUNITY): Payer: Medicare Other

## 2011-08-24 ENCOUNTER — Encounter (INDEPENDENT_AMBULATORY_CARE_PROVIDER_SITE_OTHER): Payer: Medicare Other | Admitting: Ophthalmology

## 2011-09-13 ENCOUNTER — Encounter (INDEPENDENT_AMBULATORY_CARE_PROVIDER_SITE_OTHER): Payer: Medicare Other | Admitting: Ophthalmology

## 2011-10-10 ENCOUNTER — Encounter (INDEPENDENT_AMBULATORY_CARE_PROVIDER_SITE_OTHER): Payer: Self-pay | Admitting: Ophthalmology

## 2011-11-09 ENCOUNTER — Encounter (INDEPENDENT_AMBULATORY_CARE_PROVIDER_SITE_OTHER): Payer: Self-pay | Admitting: Ophthalmology

## 2011-11-12 ENCOUNTER — Encounter (INDEPENDENT_AMBULATORY_CARE_PROVIDER_SITE_OTHER): Payer: Self-pay | Admitting: Ophthalmology

## 2011-11-19 ENCOUNTER — Encounter (INDEPENDENT_AMBULATORY_CARE_PROVIDER_SITE_OTHER): Payer: Medicare Other | Admitting: Ophthalmology

## 2011-11-19 DIAGNOSIS — H35379 Puckering of macula, unspecified eye: Secondary | ICD-10-CM

## 2011-11-19 DIAGNOSIS — I10 Essential (primary) hypertension: Secondary | ICD-10-CM

## 2011-11-19 DIAGNOSIS — H35039 Hypertensive retinopathy, unspecified eye: Secondary | ICD-10-CM

## 2011-11-19 DIAGNOSIS — H43819 Vitreous degeneration, unspecified eye: Secondary | ICD-10-CM

## 2011-12-18 ENCOUNTER — Other Ambulatory Visit: Payer: Self-pay | Admitting: Family Medicine

## 2011-12-18 DIAGNOSIS — Z1231 Encounter for screening mammogram for malignant neoplasm of breast: Secondary | ICD-10-CM

## 2012-01-28 ENCOUNTER — Ambulatory Visit: Payer: Medicare Other

## 2012-02-27 ENCOUNTER — Ambulatory Visit: Payer: Medicare Other

## 2012-03-07 ENCOUNTER — Other Ambulatory Visit: Payer: Self-pay | Admitting: Physical Medicine and Rehabilitation

## 2012-03-07 DIAGNOSIS — M545 Low back pain, unspecified: Secondary | ICD-10-CM

## 2012-03-11 ENCOUNTER — Ambulatory Visit
Admission: RE | Admit: 2012-03-11 | Discharge: 2012-03-11 | Disposition: A | Discharge status: Home / Self Care | Payer: Medicare Other | Source: Ambulatory Visit | Attending: Physical Medicine and Rehabilitation | Admitting: Physical Medicine and Rehabilitation

## 2012-03-11 DIAGNOSIS — M545 Low back pain, unspecified: Secondary | ICD-10-CM

## 2012-03-16 ENCOUNTER — Other Ambulatory Visit: Payer: Medicare Other

## 2012-03-20 ENCOUNTER — Other Ambulatory Visit: Payer: Medicare Other

## 2012-03-24 ENCOUNTER — Ambulatory Visit: Payer: Medicare Other

## 2012-03-25 ENCOUNTER — Ambulatory Visit
Admission: RE | Admit: 2012-03-25 | Discharge: 2012-03-25 | Disposition: A | Discharge status: Home / Self Care | Payer: Medicare Other | Source: Ambulatory Visit | Attending: Physical Medicine and Rehabilitation | Admitting: Physical Medicine and Rehabilitation

## 2012-03-25 MED ORDER — GADOBENATE DIMEGLUMINE 529 MG/ML IV SOLN
20.0000 mL | Freq: Once | INTRAVENOUS | Status: AC | PRN
  Administered 2012-03-25: 20 mL via INTRAVENOUS

## 2012-04-18 ENCOUNTER — Ambulatory Visit
Admission: RE | Admit: 2012-04-18 | Discharge: 2012-04-18 | Disposition: A | Discharge status: Home / Self Care | Payer: Medicare Other | Source: Ambulatory Visit | Attending: Family Medicine | Admitting: Family Medicine

## 2012-04-18 DIAGNOSIS — Z1231 Encounter for screening mammogram for malignant neoplasm of breast: Secondary | ICD-10-CM

## 2012-05-21 ENCOUNTER — Other Ambulatory Visit: Payer: Self-pay | Admitting: Physical Medicine and Rehabilitation

## 2012-05-21 DIAGNOSIS — M4800 Spinal stenosis, site unspecified: Secondary | ICD-10-CM

## 2012-05-27 ENCOUNTER — Ambulatory Visit
Admission: RE | Admit: 2012-05-27 | Discharge: 2012-05-27 | Disposition: A | Discharge status: Home / Self Care | Payer: Medicare Other | Source: Ambulatory Visit | Attending: Physical Medicine and Rehabilitation | Admitting: Physical Medicine and Rehabilitation

## 2012-05-27 VITALS — BP 87/58 | HR 73

## 2012-05-27 DIAGNOSIS — M4800 Spinal stenosis, site unspecified: Secondary | ICD-10-CM

## 2012-05-27 DIAGNOSIS — IMO0002 Reserved for concepts with insufficient information to code with codable children: Secondary | ICD-10-CM

## 2012-05-27 MED ORDER — ONDANSETRON HCL 4 MG/2ML IJ SOLN
4.0000 mg | Freq: Once | INTRAMUSCULAR | Status: AC
  Administered 2012-05-27: 4 mg via INTRAMUSCULAR

## 2012-05-27 MED ORDER — IOHEXOL 180 MG/ML  SOLN: 17.0000 mL | Freq: Once | INTRAMUSCULAR | Status: DC | PRN

## 2012-05-27 MED ORDER — DIAZEPAM 5 MG PO TABS
5.0000 mg | ORAL_TABLET | Freq: Once | ORAL | Status: AC
  Administered 2012-05-27: 5 mg via ORAL

## 2012-05-27 MED ORDER — IOHEXOL 180 MG/ML  SOLN
17.0000 mL | Freq: Once | INTRAMUSCULAR | Status: AC | PRN
  Administered 2012-05-27: 17 mL via INTRATHECAL

## 2012-05-27 MED ORDER — MEPERIDINE HCL 100 MG/ML IJ SOLN
75.0000 mg | Freq: Once | INTRAMUSCULAR | Status: AC
  Administered 2012-05-27: 75 mg via INTRAMUSCULAR

## 2012-05-27 NOTE — Discharge Instructions (Signed)
Myelogram Discharge Instructions  1. Go home and rest quietly for the next 24 hours.  It is important to lie flat for the next 24 hours.  Get up only to go to the restroom.  You may lie in the bed or on a couch on your back, your stomach, your left side or your right side.  You may have one pillow under your head.  You may have pillows between your knees while you are on your side or under your knees while you are on your back.  2. DO NOT drive today.  Recline the seat as far back as it will go, while still wearing your seat belt, on the way home.  3. You may get up to go to the bathroom as needed.  You may sit up for 10 minutes to eat.  You may resume your normal diet and medications unless otherwise indicated.  Drink lots of extra fluids today and tomorrow.  4. The incidence of headache, nausea, or vomiting is about 5% (one in 20 patients).  If you develop a headache, lie flat and drink plenty of fluids until the headache goes away.  Caffeinated beverages may be helpful.  If you develop severe nausea and vomiting or a headache that does not go away with flat bed rest, call [phone].  5. You may resume normal activities after your 24 hours of bed rest is over; however, do not exert yourself strongly or do any heavy lifting tomorrow. If when you get up you have a headache when standing, go back to bed and force fluids for another 24 hours.  6. Call your physician for a follow-up appointment.  The results of your myelogram will be sent directly to your physician by the following day.  7. If you have any questions or if complications develop after you arrive home, please call [phone].  Discharge instructions have been explained to the patient.  The patient, or the person responsible for the patient, fully understands these instructions.      MAY RESUME CITALOPRAM, TRAZODONE AND SUMATRIPTAN ON May 28, 2012, AFTER 11:00 AM.

## 2012-05-27 NOTE — Progress Notes (Signed)
Patient states she has been off Citralopram, Sumatriptan and Trazadone for the past three days.  Discharge instructions explained to patient.  Donell Sievert, RN

## 2012-07-17 ENCOUNTER — Other Ambulatory Visit: Payer: Self-pay | Admitting: Neurosurgery

## 2012-07-18 ENCOUNTER — Encounter (HOSPITAL_COMMUNITY): Payer: Self-pay | Admitting: Pharmacy Technician

## 2012-07-21 ENCOUNTER — Encounter (HOSPITAL_COMMUNITY)
Admission: RE | Admit: 2012-07-21 | Discharge: 2012-07-21 | Disposition: A | Discharge status: Home / Self Care | Payer: Medicare Other | Source: Ambulatory Visit | Attending: Neurosurgery | Admitting: Neurosurgery

## 2012-07-21 ENCOUNTER — Ambulatory Visit (HOSPITAL_COMMUNITY)
Admission: RE | Admit: 2012-07-21 | Discharge: 2012-07-21 | Disposition: A | Discharge status: Home / Self Care | Payer: Medicare Other | Source: Ambulatory Visit | Attending: Neurosurgery | Admitting: Neurosurgery

## 2012-07-21 ENCOUNTER — Encounter (HOSPITAL_COMMUNITY): Payer: Self-pay

## 2012-07-21 DIAGNOSIS — Z01810 Encounter for preprocedural cardiovascular examination: Secondary | ICD-10-CM | POA: Insufficient documentation

## 2012-07-21 DIAGNOSIS — Z01812 Encounter for preprocedural laboratory examination: Secondary | ICD-10-CM | POA: Insufficient documentation

## 2012-07-21 DIAGNOSIS — Z01818 Encounter for other preprocedural examination: Secondary | ICD-10-CM | POA: Insufficient documentation

## 2012-07-21 DIAGNOSIS — M4800 Spinal stenosis, site unspecified: Secondary | ICD-10-CM | POA: Insufficient documentation

## 2012-07-21 HISTORY — DX: Other complications of anesthesia, initial encounter: T88.59XA

## 2012-07-21 HISTORY — DX: Hypothyroidism, unspecified: E03.9

## 2012-07-21 HISTORY — DX: Depression, unspecified: F32.A

## 2012-07-21 HISTORY — DX: Major depressive disorder, single episode, unspecified: F32.9

## 2012-07-21 HISTORY — DX: Headache: R51

## 2012-07-21 HISTORY — DX: Adverse effect of unspecified anesthetic, initial encounter: T41.45XA

## 2012-07-21 HISTORY — DX: Gastro-esophageal reflux disease without esophagitis: K21.9

## 2012-07-21 HISTORY — DX: Unspecified osteoarthritis, unspecified site: M19.90

## 2012-07-21 LAB — BASIC METABOLIC PANEL
BUN: 16 mg/dL (ref 6–23)
CO2: 31 mEq/L (ref 19–32)
Calcium: 9.6 mg/dL (ref 8.4–10.5)
Chloride: 99 mEq/L (ref 96–112)
Creatinine, Ser: 0.95 mg/dL (ref 0.50–1.10)
GFR calc Af Amer: 71 mL/min — ABNORMAL LOW (ref >90)
GFR calc non Af Amer: 62 mL/min — ABNORMAL LOW (ref >90)
Glucose, Bld: 97 mg/dL (ref 70–99)
Potassium: 5.1 mEq/L (ref 3.5–5.1)
Sodium: 141 mEq/L (ref 135–145)

## 2012-07-21 LAB — SURGICAL PCR SCREEN
MRSA, PCR: NEGATIVE
Staphylococcus aureus: NEGATIVE

## 2012-07-21 LAB — CBC
HCT: 43 % (ref 36.0–46.0)
Hemoglobin: 13.8 g/dL (ref 12.0–15.0)
MCH: 32 pg (ref 26.0–34.0)
MCHC: 32.1 g/dL (ref 30.0–36.0)
MCV: 99.8 fL (ref 78.0–100.0)
Platelets: 247 10*3/uL (ref 150–400)
RBC: 4.31 MIL/uL (ref 3.87–5.11)
RDW: 14.5 % (ref 11.5–15.5)
WBC: 10.1 10*3/uL (ref 4.0–10.5)

## 2012-07-21 NOTE — Pre-Procedure Instructions (Signed)
SUZANNE KHO  07/21/2012   Your procedure is scheduled on:  Wed, June 18 @ 10:45 AM  Report to [facility] at 7:45 AM.  Call this number if you have problems the morning of surgery: [phone]   Remember:   Do not eat food or drink liquids after midnight.   Take these medicines the morning of surgery with A SIP OF WATER: Citalopram(Celexa),Duloxetine(Cymbalta),Pain Pill(if needed),Synthroid(Levothyroxine),and Omeprazole(Prilosec)   Do not wear jewelry, make-up or nail polish.  Do not wear lotions, powders, or perfumes. You may wear deodorant.  Do not shave 48 hours prior to surgery.   Do not bring valuables to the hospital.  [facility] is not responsible                   for any belongings or valuables.  Contacts, dentures or bridgework may not be worn into surgery.  Leave suitcase in the car. After surgery it may be brought to your room.  For patients admitted to the hospital, checkout time is 11:00 AM the day of  discharge.   Patients discharged the day of surgery will not be allowed to drive  home.    Special Instructions: Shower using CHG 2 nights before surgery and the night before surgery.  If you shower the day of surgery use CHG.  Use special wash - you have one bottle of CHG for all showers.  You should use approximately 1/3 of the bottle for each shower.   Please read over the following fact sheets that you were given: Pain Booklet, Coughing and Deep Breathing, MRSA Information and Surgical Site Infection Prevention

## 2012-07-22 MED ORDER — VANCOMYCIN HCL 10 G IV SOLR
1500.0000 mg | INTRAVENOUS | Status: AC
  Administered 2012-07-23: 1500 mg via INTRAVENOUS
  Filled 2012-07-22: qty 1500

## 2012-07-23 ENCOUNTER — Inpatient Hospital Stay (HOSPITAL_COMMUNITY): Payer: Medicare Other

## 2012-07-23 ENCOUNTER — Inpatient Hospital Stay (HOSPITAL_COMMUNITY): Payer: Medicare Other | Admitting: Anesthesiology

## 2012-07-23 ENCOUNTER — Inpatient Hospital Stay (HOSPITAL_COMMUNITY)
Admission: RE | Admit: 2012-07-23 | Discharge: 2012-08-04 | DRG: 490 | Disposition: A | Discharge status: Home / Self Care | Payer: Medicare Other | Source: Ambulatory Visit | Attending: Neurosurgery | Admitting: Neurosurgery

## 2012-07-23 ENCOUNTER — Encounter (HOSPITAL_COMMUNITY)
Admission: RE | Disposition: A | Discharge status: Home / Self Care | Payer: Self-pay | Source: Ambulatory Visit | Attending: Neurosurgery

## 2012-07-23 ENCOUNTER — Encounter (HOSPITAL_COMMUNITY): Payer: Self-pay | Admitting: Anesthesiology

## 2012-07-23 DIAGNOSIS — I10 Essential (primary) hypertension: Secondary | ICD-10-CM | POA: Diagnosis present

## 2012-07-23 DIAGNOSIS — I959 Hypotension, unspecified: Secondary | ICD-10-CM | POA: Diagnosis not present

## 2012-07-23 DIAGNOSIS — R079 Chest pain, unspecified: Secondary | ICD-10-CM

## 2012-07-23 DIAGNOSIS — G8918 Other acute postprocedural pain: Secondary | ICD-10-CM | POA: Diagnosis not present

## 2012-07-23 DIAGNOSIS — E785 Hyperlipidemia, unspecified: Secondary | ICD-10-CM | POA: Diagnosis present

## 2012-07-23 DIAGNOSIS — F319 Bipolar disorder, unspecified: Secondary | ICD-10-CM | POA: Diagnosis present

## 2012-07-23 DIAGNOSIS — E039 Hypothyroidism, unspecified: Secondary | ICD-10-CM | POA: Diagnosis present

## 2012-07-23 DIAGNOSIS — J690 Pneumonitis due to inhalation of food and vomit: Secondary | ICD-10-CM | POA: Diagnosis not present

## 2012-07-23 DIAGNOSIS — J9600 Acute respiratory failure, unspecified whether with hypoxia or hypercapnia: Secondary | ICD-10-CM | POA: Diagnosis not present

## 2012-07-23 DIAGNOSIS — Z7982 Long term (current) use of aspirin: Secondary | ICD-10-CM

## 2012-07-23 DIAGNOSIS — Z79899 Other long term (current) drug therapy: Secondary | ICD-10-CM

## 2012-07-23 DIAGNOSIS — M129 Arthropathy, unspecified: Secondary | ICD-10-CM | POA: Diagnosis present

## 2012-07-23 DIAGNOSIS — K219 Gastro-esophageal reflux disease without esophagitis: Secondary | ICD-10-CM | POA: Diagnosis present

## 2012-07-23 DIAGNOSIS — E872 Acidosis, unspecified: Secondary | ICD-10-CM | POA: Diagnosis not present

## 2012-07-23 DIAGNOSIS — M5137 Other intervertebral disc degeneration, lumbosacral region: Principal | ICD-10-CM | POA: Diagnosis present

## 2012-07-23 DIAGNOSIS — M51379 Other intervertebral disc degeneration, lumbosacral region without mention of lumbar back pain or lower extremity pain: Principal | ICD-10-CM | POA: Diagnosis present

## 2012-07-23 DIAGNOSIS — D649 Anemia, unspecified: Secondary | ICD-10-CM | POA: Diagnosis not present

## 2012-07-23 HISTORY — PX: LUMBAR LAMINECTOMY/DECOMPRESSION MICRODISCECTOMY: SHX5026

## 2012-07-23 LAB — CBC
HCT: 34.4 % — ABNORMAL LOW (ref 36.0–46.0)
Hemoglobin: 11.7 g/dL — ABNORMAL LOW (ref 12.0–15.0)
MCH: 33.1 pg (ref 26.0–34.0)
MCHC: 34 g/dL (ref 30.0–36.0)
MCV: 97.2 fL (ref 78.0–100.0)
Platelets: 228 10*3/uL (ref 150–400)
RBC: 3.54 MIL/uL — ABNORMAL LOW (ref 3.87–5.11)
RDW: 14.8 % (ref 11.5–15.5)
WBC: 16.5 10*3/uL — ABNORMAL HIGH (ref 4.0–10.5)

## 2012-07-23 LAB — CREATININE, SERUM
Creatinine, Ser: 0.74 mg/dL (ref 0.50–1.10)
GFR calc Af Amer: >90 mL/min (ref >90)
GFR calc non Af Amer: 87 mL/min — ABNORMAL LOW (ref >90)

## 2012-07-23 SURGERY — LUMBAR LAMINECTOMY/DECOMPRESSION MICRODISCECTOMY 2 LEVELS
Anesthesia: General | Laterality: Right | Wound class: Clean

## 2012-07-23 MED ORDER — OXYCODONE-ACETAMINOPHEN 5-325 MG PO TABS
1.0000 | ORAL_TABLET | ORAL | Status: DC | PRN
  Administered 2012-07-23 – 2012-07-26 (×7): 2 via ORAL
  Filled 2012-07-23 (×7): qty 2

## 2012-07-23 MED ORDER — SODIUM CHLORIDE 0.9 % IV SOLN: 250.0000 mL | INTRAVENOUS | Status: DC

## 2012-07-23 MED ORDER — HYDROMORPHONE HCL PF 1 MG/ML IJ SOLN
0.2500 mg | INTRAMUSCULAR | Status: DC | PRN
  Administered 2012-07-23 (×4): 0.5 mg via INTRAVENOUS

## 2012-07-23 MED ORDER — MORPHINE SULFATE (PF) 1 MG/ML IV SOLN
INTRAVENOUS | Status: DC
  Administered 2012-07-23: 14:00:00 via INTRAVENOUS
  Administered 2012-07-23: 13.5 mg via INTRAVENOUS
  Administered 2012-07-23: 6 mg via INTRAVENOUS

## 2012-07-23 MED ORDER — THROMBIN 20000 UNITS EX SOLR
CUTANEOUS | Status: DC | PRN
  Administered 2012-07-23: 13:00:00 via TOPICAL

## 2012-07-23 MED ORDER — PHENYLEPHRINE HCL 10 MG/ML IJ SOLN
INTRAMUSCULAR | Status: DC | PRN
  Administered 2012-07-23: 80 ug via INTRAVENOUS

## 2012-07-23 MED ORDER — MIDAZOLAM HCL 5 MG/5ML IJ SOLN
INTRAMUSCULAR | Status: DC | PRN
  Administered 2012-07-23: 2 mg via INTRAVENOUS

## 2012-07-23 MED ORDER — HEPARIN SODIUM (PORCINE) 5000 UNIT/ML IJ SOLN
5000.0000 [IU] | Freq: Three times a day (TID) | INTRAMUSCULAR | Status: DC
  Filled 2012-07-23 (×3): qty 1

## 2012-07-23 MED ORDER — HYDROMORPHONE HCL PF 1 MG/ML IJ SOLN
INTRAMUSCULAR | Status: AC
  Filled 2012-07-23: qty 1

## 2012-07-23 MED ORDER — ROCURONIUM BROMIDE 100 MG/10ML IV SOLN
INTRAVENOUS | Status: DC | PRN
  Administered 2012-07-23: 50 mg via INTRAVENOUS

## 2012-07-23 MED ORDER — PROPOFOL 10 MG/ML IV BOLUS
INTRAVENOUS | Status: DC | PRN
  Administered 2012-07-23: 50 mg via INTRAVENOUS
  Administered 2012-07-23: 200 mg via INTRAVENOUS

## 2012-07-23 MED ORDER — DIPHENHYDRAMINE HCL 12.5 MG/5ML PO ELIX: 12.5000 mg | ORAL_SOLUTION | Freq: Four times a day (QID) | ORAL | Status: DC | PRN

## 2012-07-23 MED ORDER — LACTATED RINGERS IV SOLN
INTRAVENOUS | Status: DC | PRN
  Administered 2012-07-23 (×2): via INTRAVENOUS

## 2012-07-23 MED ORDER — MORPHINE SULFATE 2 MG/ML IJ SOLN
2.0000 mg | INTRAMUSCULAR | Status: DC | PRN
  Administered 2012-07-24 – 2012-07-25 (×4): 2 mg via INTRAVENOUS
  Administered 2012-07-26: 1 mg via INTRAVENOUS
  Administered 2012-07-26 – 2012-07-27 (×4): 2 mg via INTRAVENOUS
  Administered 2012-07-27: 4 mg via INTRAVENOUS
  Administered 2012-07-27 – 2012-07-28 (×2): 2 mg via INTRAVENOUS
  Filled 2012-07-23 (×9): qty 1
  Filled 2012-07-23: qty 2
  Filled 2012-07-23 (×2): qty 1

## 2012-07-23 MED ORDER — KETOROLAC TROMETHAMINE 30 MG/ML IJ SOLN
30.0000 mg | Freq: Four times a day (QID) | INTRAMUSCULAR | Status: AC | PRN
  Administered 2012-07-24: 30 mg via INTRAVENOUS
  Filled 2012-07-23: qty 1

## 2012-07-23 MED ORDER — DULOXETINE HCL 60 MG PO CPEP
60.0000 mg | ORAL_CAPSULE | Freq: Every day | ORAL | Status: DC
  Administered 2012-07-24 – 2012-07-25 (×2): 60 mg via ORAL
  Filled 2012-07-23 (×4): qty 1

## 2012-07-23 MED ORDER — SODIUM CHLORIDE 0.9 % IJ SOLN: 9.0000 mL | INTRAMUSCULAR | Status: DC | PRN

## 2012-07-23 MED ORDER — SODIUM CHLORIDE 0.9 % IJ SOLN: 3.0000 mL | INTRAMUSCULAR | Status: DC | PRN

## 2012-07-23 MED ORDER — GLYCOPYRROLATE 0.2 MG/ML IJ SOLN
INTRAMUSCULAR | Status: DC | PRN
  Administered 2012-07-23: .6 mg via INTRAVENOUS

## 2012-07-23 MED ORDER — FENTANYL CITRATE 0.05 MG/ML IJ SOLN
INTRAMUSCULAR | Status: DC | PRN
  Administered 2012-07-23: 150 ug via INTRAVENOUS
  Administered 2012-07-23: 100 ug via INTRAVENOUS

## 2012-07-23 MED ORDER — HEPARIN SODIUM (PORCINE) 5000 UNIT/ML IJ SOLN
5000.0000 [IU] | Freq: Three times a day (TID) | INTRAMUSCULAR | Status: DC
  Administered 2012-07-23 – 2012-08-04 (×31): 5000 [IU] via SUBCUTANEOUS
  Filled 2012-07-23 (×40): qty 1

## 2012-07-23 MED ORDER — EPHEDRINE SULFATE 50 MG/ML IJ SOLN
INTRAMUSCULAR | Status: DC | PRN
  Administered 2012-07-23: 5 mg via INTRAVENOUS

## 2012-07-23 MED ORDER — ZOLPIDEM TARTRATE 5 MG PO TABS
5.0000 mg | ORAL_TABLET | Freq: Every evening | ORAL | Status: DC | PRN
  Filled 2012-07-23: qty 1

## 2012-07-23 MED ORDER — MORPHINE SULFATE (PF) 1 MG/ML IV SOLN
INTRAVENOUS | Status: AC
  Filled 2012-07-23: qty 25

## 2012-07-23 MED ORDER — PROMETHAZINE HCL 25 MG/ML IJ SOLN: 6.2500 mg | INTRAMUSCULAR | Status: DC | PRN

## 2012-07-23 MED ORDER — FENTANYL CITRATE 0.05 MG/ML IJ SOLN: 50.0000 ug | Freq: Once | INTRAMUSCULAR | Status: DC

## 2012-07-23 MED ORDER — 0.9 % SODIUM CHLORIDE (POUR BTL) OPTIME
TOPICAL | Status: DC | PRN
  Administered 2012-07-23: 1000 mL

## 2012-07-23 MED ORDER — DIPHENHYDRAMINE HCL 50 MG/ML IJ SOLN: 12.5000 mg | Freq: Four times a day (QID) | INTRAMUSCULAR | Status: DC | PRN

## 2012-07-23 MED ORDER — NALOXONE HCL 0.4 MG/ML IJ SOLN: 0.4000 mg | INTRAMUSCULAR | Status: DC | PRN

## 2012-07-23 MED ORDER — PHENOL 1.4 % MT LIQD: 1.0000 | OROMUCOSAL | Status: DC | PRN

## 2012-07-23 MED ORDER — TRAZODONE HCL 100 MG PO TABS
100.0000 mg | ORAL_TABLET | Freq: Every evening | ORAL | Status: DC | PRN
  Filled 2012-07-23: qty 3

## 2012-07-23 MED ORDER — SODIUM CHLORIDE 0.9 % IJ SOLN
3.0000 mL | Freq: Two times a day (BID) | INTRAMUSCULAR | Status: DC
  Administered 2012-07-25 – 2012-07-29 (×6): 3 mL via INTRAVENOUS

## 2012-07-23 MED ORDER — ONDANSETRON HCL 4 MG/2ML IJ SOLN
4.0000 mg | INTRAMUSCULAR | Status: DC | PRN
  Administered 2012-07-25 – 2012-07-28 (×5): 4 mg via INTRAVENOUS
  Filled 2012-07-23 (×5): qty 2

## 2012-07-23 MED ORDER — VANCOMYCIN HCL IN DEXTROSE 1-5 GM/200ML-% IV SOLN
1000.0000 mg | Freq: Two times a day (BID) | INTRAVENOUS | Status: DC
  Administered 2012-07-23 – 2012-07-26 (×6): 1000 mg via INTRAVENOUS
  Filled 2012-07-23 (×9): qty 200

## 2012-07-23 MED ORDER — OXYCODONE HCL 5 MG PO TABS
5.0000 mg | ORAL_TABLET | Freq: Once | ORAL | Status: AC | PRN
  Administered 2012-07-23: 5 mg via ORAL

## 2012-07-23 MED ORDER — LISINOPRIL 20 MG PO TABS
20.0000 mg | ORAL_TABLET | Freq: Every day | ORAL | Status: DC
  Administered 2012-07-25: 20 mg via ORAL
  Filled 2012-07-23 (×4): qty 1

## 2012-07-23 MED ORDER — MENTHOL 3 MG MT LOZG: 1.0000 | LOZENGE | OROMUCOSAL | Status: DC | PRN

## 2012-07-23 MED ORDER — OXYCODONE HCL 5 MG/5ML PO SOLN: 5.0000 mg | Freq: Once | ORAL | Status: AC | PRN

## 2012-07-23 MED ORDER — HEMOSTATIC AGENTS (NO CHARGE) OPTIME
TOPICAL | Status: DC | PRN
  Administered 2012-07-23: 1 via TOPICAL

## 2012-07-23 MED ORDER — DIAZEPAM 5 MG PO TABS
5.0000 mg | ORAL_TABLET | Freq: Four times a day (QID) | ORAL | Status: DC | PRN
  Administered 2012-07-23: 5 mg via ORAL
  Filled 2012-07-23: qty 1

## 2012-07-23 MED ORDER — OXYCODONE HCL 5 MG PO TABS
ORAL_TABLET | ORAL | Status: AC
  Filled 2012-07-23: qty 1

## 2012-07-23 MED ORDER — ONDANSETRON HCL 4 MG/2ML IJ SOLN: 4.0000 mg | Freq: Four times a day (QID) | INTRAMUSCULAR | Status: DC | PRN

## 2012-07-23 MED ORDER — LIDOCAINE HCL (CARDIAC) 20 MG/ML IV SOLN
INTRAVENOUS | Status: DC | PRN
  Administered 2012-07-23: 80 mg via INTRAVENOUS

## 2012-07-23 MED ORDER — ONDANSETRON HCL 4 MG/2ML IJ SOLN
INTRAMUSCULAR | Status: DC | PRN
  Administered 2012-07-23: 8 mg via INTRAVENOUS

## 2012-07-23 MED ORDER — NEOSTIGMINE METHYLSULFATE 1 MG/ML IJ SOLN
INTRAMUSCULAR | Status: DC | PRN
  Administered 2012-07-23: 4 mg via INTRAVENOUS

## 2012-07-23 MED ORDER — ACETAMINOPHEN 650 MG RE SUPP
650.0000 mg | RECTAL | Status: DC | PRN
  Administered 2012-07-26: 650 mg via RECTAL
  Filled 2012-07-23: qty 1

## 2012-07-23 MED ORDER — LEVOTHYROXINE SODIUM 50 MCG PO TABS
50.0000 ug | ORAL_TABLET | Freq: Every day | ORAL | Status: DC
  Administered 2012-07-24 – 2012-07-25 (×2): 50 ug via ORAL
  Filled 2012-07-23 (×5): qty 1

## 2012-07-23 MED ORDER — ARTIFICIAL TEARS OP OINT
TOPICAL_OINTMENT | OPHTHALMIC | Status: DC | PRN
  Administered 2012-07-23: 1 via OPHTHALMIC

## 2012-07-23 MED ORDER — ACETAMINOPHEN 325 MG PO TABS: 650.0000 mg | ORAL_TABLET | ORAL | Status: DC | PRN

## 2012-07-23 MED ORDER — VANCOMYCIN HCL IN DEXTROSE 1-5 GM/200ML-% IV SOLN
1000.0000 mg | Freq: Two times a day (BID) | INTRAVENOUS | Status: DC
  Filled 2012-07-23 (×2): qty 200

## 2012-07-23 MED ORDER — ONDANSETRON HCL 4 MG/2ML IJ SOLN
INTRAMUSCULAR | Status: DC | PRN
  Administered 2012-07-23: 4 mg via INTRAVENOUS

## 2012-07-23 MED ORDER — MIDAZOLAM HCL 2 MG/2ML IJ SOLN: 1.0000 mg | INTRAMUSCULAR | Status: DC | PRN

## 2012-07-23 MED ORDER — SUMATRIPTAN SUCCINATE 25 MG PO TABS
25.0000 mg | ORAL_TABLET | ORAL | Status: DC | PRN
  Filled 2012-07-23: qty 1

## 2012-07-23 MED ORDER — SODIUM CHLORIDE 0.9 % IV SOLN
INTRAVENOUS | Status: DC
  Administered 2012-07-23 – 2012-07-27 (×5): via INTRAVENOUS

## 2012-07-23 MED ORDER — VECURONIUM BROMIDE 10 MG IV SOLR
INTRAVENOUS | Status: DC | PRN
  Administered 2012-07-23: 2 mg via INTRAVENOUS
  Administered 2012-07-23: 3 mg via INTRAVENOUS

## 2012-07-23 MED ORDER — THROMBIN 5000 UNITS EX SOLR
CUTANEOUS | Status: DC | PRN
  Administered 2012-07-23 (×2): 5000 [IU] via TOPICAL

## 2012-07-23 SURGICAL SUPPLY — 63 items
APL SKNCLS STERI-STRIP NONHPOA (GAUZE/BANDAGES/DRESSINGS) ×1
BENZOIN TINCTURE PRP APPL 2/3 (GAUZE/BANDAGES/DRESSINGS) ×2 IMPLANT
BLADE SURG ROTATE 9660 (MISCELLANEOUS) IMPLANT
BUR ACORN 6.0 (BURR) ×4 IMPLANT
BUR CUTTER 7.0 ROUND (BURR) ×2 IMPLANT
BUR MATCHSTICK NEURO 3.0 LAGG (BURR) ×2 IMPLANT
CANISTER SUCTION 2500CC (MISCELLANEOUS) ×2 IMPLANT
CLOTH BEACON ORANGE TIMEOUT ST (SAFETY) ×2 IMPLANT
CONT SPEC 4OZ CLIKSEAL STRL BL (MISCELLANEOUS) ×2 IMPLANT
DRAPE C-ARM 42X72 X-RAY (DRAPES) ×2 IMPLANT
DRAPE LAPAROTOMY 100X72X124 (DRAPES) ×2 IMPLANT
DRAPE MICROSCOPE LEICA (MISCELLANEOUS) ×2 IMPLANT
DRAPE POUCH INSTRU U-SHP 10X18 (DRAPES) ×2 IMPLANT
DRSG PAD ABDOMINAL 8X10 ST (GAUZE/BANDAGES/DRESSINGS) ×2 IMPLANT
DURAFORM SPONGE 2X2 SINGLE (Neuro Prosthesis/Implant) ×1 IMPLANT
DURAPREP 26ML APPLICATOR (WOUND CARE) ×2 IMPLANT
ELECT BLADE 4.0 EZ CLEAN MEGAD (MISCELLANEOUS) ×2
ELECT REM PT RETURN 9FT ADLT (ELECTROSURGICAL) ×2
ELECTRODE BLDE 4.0 EZ CLN MEGD (MISCELLANEOUS) IMPLANT
ELECTRODE REM PT RTRN 9FT ADLT (ELECTROSURGICAL) ×1 IMPLANT
GAUZE SPONGE 4X4 16PLY XRAY LF (GAUZE/BANDAGES/DRESSINGS) IMPLANT
GLOVE BIOGEL M 8.0 STRL (GLOVE) ×2 IMPLANT
GLOVE ECLIPSE 6.5 STRL STRAW (GLOVE) ×1 IMPLANT
GLOVE EXAM NITRILE LRG STRL (GLOVE) ×1 IMPLANT
GLOVE EXAM NITRILE MD LF STRL (GLOVE) IMPLANT
GLOVE EXAM NITRILE XL STR (GLOVE) IMPLANT
GLOVE EXAM NITRILE XS STR PU (GLOVE) IMPLANT
GLOVE INDICATOR 7.0 STRL GRN (GLOVE) ×2 IMPLANT
GLOVE OPTIFIT SS 6.5 STRL BRWN (GLOVE) ×3 IMPLANT
GOWN BRE IMP SLV AUR LG STRL (GOWN DISPOSABLE) ×4 IMPLANT
GOWN BRE IMP SLV AUR XL STRL (GOWN DISPOSABLE) IMPLANT
GOWN STRL REIN 2XL LVL4 (GOWN DISPOSABLE) IMPLANT
KIT BASIN OR (CUSTOM PROCEDURE TRAY) ×2 IMPLANT
KIT ROOM TURNOVER OR (KITS) ×2 IMPLANT
NDL HYPO 18GX1.5 BLUNT FILL (NEEDLE) IMPLANT
NDL HYPO 21X1.5 SAFETY (NEEDLE) IMPLANT
NDL HYPO 25X1 1.5 SAFETY (NEEDLE) IMPLANT
NDL SPNL 20GX3.5 QUINCKE YW (NEEDLE) IMPLANT
NEEDLE HYPO 18GX1.5 BLUNT FILL (NEEDLE) IMPLANT
NEEDLE HYPO 21X1.5 SAFETY (NEEDLE) IMPLANT
NEEDLE HYPO 25X1 1.5 SAFETY (NEEDLE) IMPLANT
NEEDLE SPNL 20GX3.5 QUINCKE YW (NEEDLE) ×2 IMPLANT
NS IRRIG 1000ML POUR BTL (IV SOLUTION) ×2 IMPLANT
PACK LAMINECTOMY NEURO (CUSTOM PROCEDURE TRAY) ×2 IMPLANT
PAD ARMBOARD 7.5X6 YLW CONV (MISCELLANEOUS) ×6 IMPLANT
PATTIES SURGICAL .5 X1 (DISPOSABLE) ×2 IMPLANT
RUBBERBAND STERILE (MISCELLANEOUS) ×4 IMPLANT
SPONGE GAUZE 4X4 12PLY (GAUZE/BANDAGES/DRESSINGS) ×2 IMPLANT
SPONGE LAP 4X18 X RAY DECT (DISPOSABLE) IMPLANT
SPONGE SURGIFOAM ABS GEL SZ50 (HEMOSTASIS) ×2 IMPLANT
STRIP CLOSURE SKIN 1/2X4 (GAUZE/BANDAGES/DRESSINGS) ×2 IMPLANT
SUT ETHILON 2 0 FS 18 (SUTURE) ×1 IMPLANT
SUT VIC AB 0 CT1 18XCR BRD8 (SUTURE) ×1 IMPLANT
SUT VIC AB 0 CT1 8-18 (SUTURE) ×4
SUT VIC AB 2-0 CP2 18 (SUTURE) ×2 IMPLANT
SUT VIC AB 3-0 SH 8-18 (SUTURE) ×2 IMPLANT
SYR 20CC LL (SYRINGE) IMPLANT
SYR 20ML ECCENTRIC (SYRINGE) ×2 IMPLANT
SYR 5ML LL (SYRINGE) IMPLANT
TOWEL OR 17X24 6PK STRL BLUE (TOWEL DISPOSABLE) ×2 IMPLANT
TOWEL OR 17X26 10 PK STRL BLUE (TOWEL DISPOSABLE) ×2 IMPLANT
TRAY FOLEY CATH 14FRSI W/METER (CATHETERS) ×1 IMPLANT
WATER STERILE IRR 1000ML POUR (IV SOLUTION) ×2 IMPLANT

## 2012-07-23 NOTE — Preoperative (Signed)
Beta Blockers   Reason not to administer Beta Blockers:Not Applicable 

## 2012-07-23 NOTE — Anesthesia Postprocedure Evaluation (Signed)
  Anesthesia Post-op Note  Patient: Lindsey Rowe  Procedure(s) Performed: Procedure(s) with comments: LUMBAR LAMINECTOMY/DECOMPRESSION MICRODISCECTOMY 2 LEVELS (Right) - Right Lumbar three-four  lumbar four-five Laminectomy/Foraminotomy  Patient Location: PACU  Anesthesia Type:General  Level of Consciousness: awake  Airway and Oxygen Therapy: Patient Spontanous Breathing  Post-op Pain: mild  Post-op Assessment: Post-op Vital signs reviewed, Patient's Cardiovascular Status Stable, Respiratory Function Stable, Patent Airway, No signs of Nausea or vomiting and Pain level controlled  Post-op Vital Signs: stable  Complications: No apparent anesthesia complications

## 2012-07-23 NOTE — H&P (Signed)
Lindsey Rowe is an 67 y.o. female.   Chief Complaint: lbp HPI: patient with lumbar painwith radiation to the right leg for more than 18 months. 4 years ago she had a lumbar fusion. She is not better with conservative treatment  Past Medical History  Diagnosis Date  . Hypertension   . Bipolar 1 disorder   . Thyroid disease   . Back pain   . Hyperlipidemia   . Complication of anesthesia     hard to wake up anesthesia  . Hypothyroidism   . Depression   . GERD (gastroesophageal reflux disease)   . Headache(784.0)     migraines and tension headaches  . Arthritis     Past Surgical History  Procedure Laterality Date  . Appendectomy    . Gastric bypass    . Back surgery    . Tonsillectomy    . Wrist surgery    . Eye surgery Bilateral     lens implant  . Nasal sinus surgery Bilateral     No family history on file. Social History:  reports that she has never smoked. She has never used smokeless tobacco. She reports that  drinks alcohol. She reports that she does not use illicit drugs.  Allergies:  Allergies  Allergen Reactions  . Cefadroxil Itching    Ends of hair itched   . Gabapentin Other (See Comments)    Right foot and leg swelled     Medications Prior to Admission  Medication Sig Dispense Refill  . Aspirin-Acetaminophen-Caffeine 260-130-16 MG TABS Take 1 packet by mouth daily as needed. For headaches       . DULoxetine (CYMBALTA) 60 MG capsule Take 60 mg by mouth daily.      [name] HYDROcodone-acetaminophen (VICODIN) 5-500 MG per tablet Take 1 tablet by mouth every 6 (six) hours as needed. For back pain       . levothyroxine (SYNTHROID, LEVOTHROID) 50 MCG tablet Take 50 mcg by mouth daily before breakfast.      . lisinopril (PRINIVIL,ZESTRIL) 10 MG tablet Take 20 mg by mouth daily.       [name] omeprazole (PRILOSEC) 10 MG capsule Take 10 mg by mouth 2 (two) times daily.       . pravastatin (PRAVACHOL) 10 MG tablet Take 20 mg by mouth at bedtime.       . SUMAtriptan  (IMITREX) 25 MG tablet Take 25 mg by mouth every 2 (two) hours as needed for migraine.      . traZODone (DESYREL) 100 MG tablet Take 100-300 mg by mouth at bedtime as needed and may repeat dose one time if needed for sleep.         Results for orders placed during the hospital encounter of 07/21/12 (from the past 48 hour(s))  SURGICAL PCR SCREEN     Status: None   Collection Time    07/21/12  3:45 PM      Result Value Range   MRSA, PCR NEGATIVE  NEGATIVE   Staphylococcus aureus NEGATIVE  NEGATIVE   Comment:            The Xpert SA Assay (FDA     approved for NASAL specimens     in patients over 70 years of age),     is one component of     a comprehensive surveillance     program.  Test performance has     been validated by [employer] for patients greater  than or equal to 40 year old.     It is not intended     to diagnose infection nor to     guide or monitor treatment.  BASIC METABOLIC PANEL     Status: Abnormal   Collection Time    07/21/12  3:45 PM      Result Value Range   Sodium 141  135 - 145 mEq/L   Potassium 5.1  3.5 - 5.1 mEq/L   Chloride 99  96 - 112 mEq/L   CO2 31  19 - 32 mEq/L   Glucose, Bld 97  70 - 99 mg/dL   BUN 16  6 - 23 mg/dL   Creatinine, Ser 1.61  0.50 - 1.10 mg/dL   Calcium 9.6  8.4 - 09.6 mg/dL   GFR calc non Af Amer 62 (*) >90 mL/min   GFR calc Af Amer 71 (*) >90 mL/min   Comment:            The eGFR has been calculated     using the CKD EPI equation.     This calculation has not been     validated in all clinical     situations.     eGFR's persistently     <90 mL/min signify     possible Chronic Kidney Disease.  CBC     Status: None   Collection Time    07/21/12  3:45 PM      Result Value Range   WBC 10.1  4.0 - 10.5 K/uL   RBC 4.31  3.87 - 5.11 MIL/uL   Hemoglobin 13.8  12.0 - 15.0 g/dL   HCT 04.5  40.9 - 81.1 %   MCV 99.8  78.0 - 100.0 fL   MCH 32.0  26.0 - 34.0 pg   MCHC 32.1  30.0 - 36.0 g/dL   RDW 91.4  78.2 - 95.6 %    Platelets 247  150 - 400 K/uL   Dg Chest 2 View  07/21/2012   *RADIOLOGY REPORT*  Clinical Data: Preoperative respiratory exam.  Multilevel spinal stenosis.  CHEST - 2 VIEW  Comparison: Chest x-ray dated 03/31/2009  Findings: The heart size and pulmonary vascularity are normal and the lungs are clear.  No significant osseous abnormality.  IMPRESSION: No acute disease in the chest.   Original Report Authenticated By: Francene Boyers, M.D.    Review of Systems  Constitutional: Negative.   HENT: Positive for tinnitus.   Eyes: Negative.   Respiratory: Negative.   Cardiovascular: Negative.   Gastrointestinal: Negative.   Genitourinary: Negative.   Musculoskeletal: Positive for back pain.  Skin: Negative.   Neurological: Positive for focal weakness.  Endo/Heme/Allergies:       Thyroid disease  Psychiatric/Behavioral:       Bipolar    Blood pressure 100/58, pulse 87, temperature 98.3 F (36.8 C), temperature source Oral, resp. rate 18, SpO2 95.00%. Physical Exam hent, nl. Neck, nl. Cv, nl. lugns clear. Abdomen,soft. Extremities, nl. NEURO, weakness of right foot dorsiflexion to 3/5. slr positive at 60 degrees in the right leg. Femoral stretch maneuver positive                                   Ct post myelogram  shows multiple levels of ddd,stenosis and foraminal narrow at 3-4 but worse at l4-5 Assessment/Plan *patent to have laminotomy and foraminotomy at l3-4 and 4-5 in the right. She is  aware of risks and benefits  Lindsey Rowe M 07/23/2012, 10:37 AM

## 2012-07-23 NOTE — Progress Notes (Signed)
Op note [phone]

## 2012-07-23 NOTE — Op Note (Signed)
NAMEMERICA, PRELL               ACCOUNT NO.:  [id number]  MEDICAL RECORD NO.:  [id number]  LOCATION:  [id number]                        FACILITY:  [facility]  PHYSICIAN:  Hilda Lias, M.D.   DATE OF BIRTH:  [birth year]  DATE OF PROCEDURE:  07/23/2012 DATE OF DISCHARGE:                              OPERATIVE REPORT   PREOPERATIVE DIAGNOSIS:  Right L4-L5, L3-L4 foraminal narrow with chronic radiculopathy.  Status post lumbar fusion, L2-S1.  POSTOPERATIVE DIAGNOSIS:  Right L4-L5, L3-L4 foraminal narrow with chronic radiculopathy.  Status post lumbar fusion, L2-S1.  PROCEDURE:  Right L3-4, L4-5 laminotomy, decompression intra and extraforaminal of the L3, L4, and L5 nerve roots.  Microscope.  SURGEON:  Hilda Lias, M.D.  ASSISTANT:  Coletta Memos, M.D.  CLINICAL HISTORY:  Ms. Klose is a 67 year old female who about 5 years ago underwent decompression, posterolateral arthrodesis.  The patient did well, but for the past 18 months she had been complaining of pain in the right leg, which is not better with conservative treatment. Myelogram showed stenosis at the level of moderate at 3-4 and severe at L4-5.  Surgery was advised and the risk was fully explained to her including the possibility of no improvement whatsoever, infection, hematoma, CSF leak, need for further surgery.  PROCEDURE:  The patient was taken to the OR, and after intubation, she was positioned in a prone manner.  Because of her obesity, it was difficult to feel any bony structure.  The area was cleaned with DuraPrep and drapes were applied.  Midline incision following the previous one in the lower part was made through the skin and subcutaneous tissue through a thick adipose tissue straight down to the spine.  Because of the decompressive laminectomy, arthrodesis difficult to see or feel any anatomy.  X-rays showed indeed that we were at the level of 3-4 and 4-5.  We did lysis of adhesion with retractor and we were  able to see and feel the midline as well as the lateral aspect.  We started drilling the lower lateral aspect of the canal.  It was difficult to get at the level of 4-5.  Then we approached extraforaminal right at the outside of the facet.  The L4 and L5 were decompressed. From then on, we followed and we were able to follow the thecal sac all the way to 3-4.  The 3-4 nerve root were decompressed.  Around the L3 nerve root, the worse was at the L4.  Lysis was accomplished.  There was an area with arachnoid pouch.  We put some DuraGen to cover the area.  Valsalva maneuver was negative.  Repeat x-rays showed that we were in the area of 3-4 and 4-5. Having good decompression, the area was irrigated and closed with Vicryl and nylon.          ______________________________ Hilda Lias, M.D.     EB/MEDQ  D:  07/23/2012  T:  07/23/2012  Job:  [id number]

## 2012-07-23 NOTE — Progress Notes (Signed)
Pt received from PACU s/p 2 level lam ectomy  Alert and oriented made comfortable in bed pCa morphine full dose in use pt c/o rt groin pain and difficulting moving Rt LLE encouraged to use PCA  pump button and call bell within reach. Pt on best rest and logroll only with HOB 5 degrees. Pt made aware .

## 2012-07-23 NOTE — Transfer of Care (Signed)
Immediate Anesthesia Transfer of Care Note  Patient: Lindsey Rowe  Procedure(s) Performed: Procedure(s) with comments: LUMBAR LAMINECTOMY/DECOMPRESSION MICRODISCECTOMY 2 LEVELS (Right) - Right Lumbar three-four  lumbar four-five Laminectomy/Foraminotomy  Patient Location: PACU  Anesthesia Type:General  Level of Consciousness: awake, alert  and oriented  Airway & Oxygen Therapy: Patient Spontanous Breathing and Patient connected to nasal cannula oxygen  Post-op Assessment: Report given to PACU RN, Post -op Vital signs reviewed and stable and Patient moving all extremities X 4  Post vital signs: Reviewed and stable  Complications: No apparent anesthesia complications

## 2012-07-23 NOTE — Anesthesia Preprocedure Evaluation (Signed)
Anesthesia Evaluation  Patient identified by MRN, date of birth, ID band Patient awake    Reviewed: Allergy & Precautions, H&P , NPO status , Patient's Chart, lab work & pertinent test results  Airway Mallampati: II TM Distance: <3 FB Neck ROM: Full    Dental   Pulmonary  breath sounds clear to auscultation        Cardiovascular hypertension, Rhythm:Regular Rate:Normal     Neuro/Psych  Headaches, Bipolar Disorder    GI/Hepatic GERD-  ,  Endo/Other  Hypothyroidism Morbid obesity  Renal/GU      Musculoskeletal   Abdominal (+) + obese,   Peds  Hematology   Anesthesia Other Findings   Reproductive/Obstetrics                           Anesthesia Physical Anesthesia Plan  ASA: III  Anesthesia Plan: General   Post-op Pain Management:    Induction: Intravenous  Airway Management Planned: Oral ETT  Additional Equipment:   Intra-op Plan:   Post-operative Plan: Extubation in OR  Informed Consent: I have reviewed the patients History and Physical, chart, labs and discussed the procedure including the risks, benefits and alternatives for the proposed anesthesia with the patient or authorized representative who has indicated his/her understanding and acceptance.     Plan Discussed with: CRNA and Surgeon  Anesthesia Plan Comments:         Anesthesia Quick Evaluation

## 2012-07-23 NOTE — Progress Notes (Signed)
Received call from Dr. Jeral Fruit for patient to be flat to 5 degrees with pillow and log rolling for 48 hours.

## 2012-07-24 ENCOUNTER — Encounter (HOSPITAL_COMMUNITY): Payer: Self-pay | Admitting: *Deleted

## 2012-07-24 MED ORDER — PREGABALIN 50 MG PO CAPS
50.0000 mg | ORAL_CAPSULE | Freq: Three times a day (TID) | ORAL | Status: DC
  Administered 2012-07-24 – 2012-07-25 (×5): 50 mg via ORAL
  Filled 2012-07-24 (×5): qty 1

## 2012-07-24 NOTE — Progress Notes (Signed)
UR COMPLETED  

## 2012-07-24 NOTE — Progress Notes (Signed)
Patient ID: Lindsey Rowe, female   DOB: Apr 03, 1945, 67 y.o.   MRN: [id number] C/o incisional pain, no weakness. Some numbness. No headache. Flat in bed till Saturday. ? Were answered

## 2012-07-25 MED ORDER — PROMETHAZINE HCL 25 MG/ML IJ SOLN
12.5000 mg | Freq: Four times a day (QID) | INTRAMUSCULAR | Status: DC | PRN
Start: 2012-07-25 — End: 2012-07-26
  Administered 2012-07-26: 12.5 mg via INTRAVENOUS
  Filled 2012-07-25: qty 1

## 2012-07-25 NOTE — Progress Notes (Signed)
Patient's RN notified of need for PT/OT order when patient is able to mobilize. Pt's insurance will not approve SNF placement without PT/OT evals.  Elmer Bales RN, MSN, CM

## 2012-07-25 NOTE — Clinical Social Work Note (Signed)
CSW was consulted for possible SNF placement.  Pt will need PT/OT consult and recommendations in prior to CSW searching for facility placement, per insurance requirement.    Vickii Penna, LCSWA [phone]  Clinical Social Work

## 2012-07-25 NOTE — Progress Notes (Signed)
Patient ID: Lindsey Rowe, female   DOB: 02-21-1945, 67 y.o.   MRN: [id number] Found hob up 5 degrees with pillows. No headache, sensory better. No weakness . Spoke with friend. Talked with her RN about the need to be flat in bed

## 2012-07-25 NOTE — Progress Notes (Signed)
Patient ID: Lindsey Rowe, female   DOB: [birth year], 67 y.o.   MRN: [id number] Satble,sleeping. No fever. Plan oob on Sunday. No headache

## 2012-07-26 ENCOUNTER — Inpatient Hospital Stay (HOSPITAL_COMMUNITY): Payer: Medicare Other

## 2012-07-26 DIAGNOSIS — R079 Chest pain, unspecified: Secondary | ICD-10-CM

## 2012-07-26 DIAGNOSIS — J9600 Acute respiratory failure, unspecified whether with hypoxia or hypercapnia: Secondary | ICD-10-CM | POA: Diagnosis not present

## 2012-07-26 DIAGNOSIS — J690 Pneumonitis due to inhalation of food and vomit: Secondary | ICD-10-CM | POA: Diagnosis not present

## 2012-07-26 LAB — BLOOD GAS, ARTERIAL
Acid-Base Excess: 1.8 mmol/L (ref 0.0–2.0)
Acid-Base Excess: 5.4 mmol/L — ABNORMAL HIGH (ref 0.0–2.0)
Acid-Base Excess: 5.5 mmol/L — ABNORMAL HIGH (ref 0.0–2.0)
Acid-Base Excess: 5.9 mmol/L — ABNORMAL HIGH (ref 0.0–2.0)
Bicarbonate: 28.7 mEq/L — ABNORMAL HIGH (ref 20.0–24.0)
Bicarbonate: 32.4 mEq/L — ABNORMAL HIGH (ref 20.0–24.0)
Bicarbonate: 32.6 mEq/L — ABNORMAL HIGH (ref 20.0–24.0)
Bicarbonate: 33.1 mEq/L — ABNORMAL HIGH (ref 20.0–24.0)
Delivery systems: POSITIVE
Delivery systems: POSITIVE
Delivery systems: POSITIVE
Drawn by: 28120
Drawn by: 310571
Drawn by: 35235
Expiratory PAP: 6
Expiratory PAP: 6
Expiratory PAP: 6
FIO2: 100 %
FIO2: 0.6 %
FIO2: 0.6 %
FIO2: 60 %
Inspiratory PAP: 14
Inspiratory PAP: 14
Inspiratory PAP: 14
Mode: POSITIVE
O2 Saturation: 99.4 %
O2 Saturation: 97.5 %
O2 Saturation: 98.9 %
O2 Saturation: 98.7 %
Patient temperature: 98.6
Patient temperature: 98.6
Patient temperature: 98.6
Patient temperature: 98.6
TCO2: 30.8 mmol/L (ref 0–100)
TCO2: 34.8 mmol/L (ref 0–100)
TCO2: 35 mmol/L (ref 0–100)
TCO2: 35.7 mmol/L (ref 0–100)
pCO2 arterial: 71 mmHg (ref 35.0–45.0)
pCO2 arterial: 78.3 mmHg (ref 35.0–45.0)
pCO2 arterial: 78.9 mmHg (ref 35.0–45.0)
pCO2 arterial: 82.3 mmHg (ref 35.0–45.0)
pH, Arterial: 7.23 — ABNORMAL LOW (ref 7.350–7.450)
pH, Arterial: 7.241 — ABNORMAL LOW (ref 7.350–7.450)
pH, Arterial: 7.239 — ABNORMAL LOW (ref 7.350–7.450)
pH, Arterial: 7.229 — ABNORMAL LOW (ref 7.350–7.450)
pO2, Arterial: 185 mmHg — ABNORMAL HIGH (ref 80.0–100.0)
pO2, Arterial: 101 mmHg — ABNORMAL HIGH (ref 80.0–100.0)
pO2, Arterial: 149 mmHg — ABNORMAL HIGH (ref 80.0–100.0)
pO2, Arterial: 143 mmHg — ABNORMAL HIGH (ref 80.0–100.0)

## 2012-07-26 LAB — COMPREHENSIVE METABOLIC PANEL
ALT: 17 U/L (ref 0–35)
AST: 21 U/L (ref 0–37)
Albumin: 2.6 g/dL — ABNORMAL LOW (ref 3.5–5.2)
Alkaline Phosphatase: 67 U/L (ref 39–117)
BUN: 10 mg/dL (ref 6–23)
CO2: 33 mEq/L — ABNORMAL HIGH (ref 19–32)
Calcium: 8.9 mg/dL (ref 8.4–10.5)
Chloride: 99 mEq/L (ref 96–112)
Creatinine, Ser: 0.74 mg/dL (ref 0.50–1.10)
GFR calc Af Amer: >90 mL/min (ref >90)
GFR calc non Af Amer: 87 mL/min — ABNORMAL LOW (ref >90)
Glucose, Bld: 124 mg/dL — ABNORMAL HIGH (ref 70–99)
Potassium: 4.2 mEq/L (ref 3.5–5.1)
Sodium: 137 mEq/L (ref 135–145)
Total Bilirubin: 0.3 mg/dL (ref 0.3–1.2)
Total Protein: 6 g/dL (ref 6.0–8.3)

## 2012-07-26 LAB — CK TOTAL AND CKMB (NOT AT ARMC)
CK, MB: 2.8 ng/mL (ref 0.3–4.0)
Relative Index: 1.9 (ref 0.0–2.5)
Total CK: 150 U/L (ref 7–177)

## 2012-07-26 LAB — CBC
HCT: 31.1 % — ABNORMAL LOW (ref 36.0–46.0)
Hemoglobin: 10.1 g/dL — ABNORMAL LOW (ref 12.0–15.0)
MCH: 32.4 pg (ref 26.0–34.0)
MCHC: 32.5 g/dL (ref 30.0–36.0)
MCV: 99.7 fL (ref 78.0–100.0)
Platelets: 205 10*3/uL (ref 150–400)
RBC: 3.12 MIL/uL — ABNORMAL LOW (ref 3.87–5.11)
RDW: 14.1 % (ref 11.5–15.5)
WBC: 18.5 10*3/uL — ABNORMAL HIGH (ref 4.0–10.5)

## 2012-07-26 LAB — TROPONIN I
Troponin I: <0.3 ng/mL (ref <0.30)
Troponin I: <0.3 ng/mL (ref <0.30)

## 2012-07-26 MED ORDER — VANCOMYCIN HCL 10 G IV SOLR
1500.0000 mg | Freq: Once | INTRAVENOUS | Status: DC
  Filled 2012-07-26: qty 1500

## 2012-07-26 MED ORDER — SODIUM CHLORIDE 0.9 % IV SOLN
10.0000 ug/h | INTRAVENOUS | Status: DC
  Filled 2012-07-26: qty 50

## 2012-07-26 MED ORDER — NALOXONE HCL 0.4 MG/ML IJ SOLN
0.4000 mg | INTRAMUSCULAR | Status: DC | PRN
  Filled 2012-07-26: qty 1

## 2012-07-26 MED ORDER — ARTIFICIAL TEARS OP OINT
1.0000 "application " | TOPICAL_OINTMENT | Freq: Three times a day (TID) | OPHTHALMIC | Status: DC
  Filled 2012-07-26: qty 3.5

## 2012-07-26 MED ORDER — LEVOTHYROXINE SODIUM 100 MCG IV SOLR
25.0000 ug | Freq: Every day | INTRAVENOUS | Status: DC
  Administered 2012-07-27: 25 ug via INTRAVENOUS
  Filled 2012-07-26 (×4): qty 5

## 2012-07-26 MED ORDER — NALOXONE HCL 0.4 MG/ML IJ SOLN
0.4000 mg | Freq: Once | INTRAMUSCULAR | Status: AC
  Administered 2012-07-26: 0.4 mg via INTRAVENOUS

## 2012-07-26 MED ORDER — CHLORHEXIDINE GLUCONATE 0.12 % MT SOLN
15.0000 mL | Freq: Two times a day (BID) | OROMUCOSAL | Status: DC
  Administered 2012-07-26 – 2012-07-29 (×6): 15 mL via OROMUCOSAL
  Filled 2012-07-26 (×7): qty 15

## 2012-07-26 MED ORDER — CISATRACURIUM BOLUS VIA INFUSION
0.1000 mg/kg | Freq: Once | INTRAVENOUS | Status: DC
  Filled 2012-07-26: qty 13

## 2012-07-26 MED ORDER — SODIUM CHLORIDE 0.9 % IV SOLN: 2000.0000 mL | Freq: Once | INTRAVENOUS | Status: DC

## 2012-07-26 MED ORDER — ASPIRIN 300 MG RE SUPP: 300.0000 mg | RECTAL | Status: DC

## 2012-07-26 MED ORDER — ALBUTEROL SULFATE (5 MG/ML) 0.5% IN NEBU
2.5000 mg | INHALATION_SOLUTION | Freq: Four times a day (QID) | RESPIRATORY_TRACT | Status: AC
  Administered 2012-07-26 – 2012-07-27 (×3): 2.5 mg via RESPIRATORY_TRACT
  Filled 2012-07-26 (×4): qty 0.5

## 2012-07-26 MED ORDER — NALOXONE HCL 0.4 MG/ML IJ SOLN
INTRAMUSCULAR | Status: AC
  Administered 2012-07-26: 0.4 mg
  Filled 2012-07-26: qty 1

## 2012-07-26 MED ORDER — SODIUM CHLORIDE 0.9 % IV SOLN
1.0000 ug/kg/min | INTRAVENOUS | Status: DC
  Filled 2012-07-26: qty 20

## 2012-07-26 MED ORDER — VANCOMYCIN HCL 10 G IV SOLR
1500.0000 mg | Freq: Two times a day (BID) | INTRAVENOUS | Status: DC
  Filled 2012-07-26: qty 1500

## 2012-07-26 MED ORDER — NOREPINEPHRINE BITARTRATE 1 MG/ML IJ SOLN
0.5000 ug/min | INTRAVENOUS | Status: DC
  Filled 2012-07-26: qty 4

## 2012-07-26 MED ORDER — PANTOPRAZOLE SODIUM 40 MG IV SOLR
40.0000 mg | INTRAVENOUS | Status: DC
  Administered 2012-07-26: 40 mg via INTRAVENOUS
  Filled 2012-07-26 (×3): qty 40

## 2012-07-26 MED ORDER — PIPERACILLIN-TAZOBACTAM 3.375 G IVPB
3.3750 g | Freq: Three times a day (TID) | INTRAVENOUS | Status: DC
  Administered 2012-07-26 – 2012-07-29 (×8): 3.375 g via INTRAVENOUS
  Filled 2012-07-26 (×12): qty 50

## 2012-07-26 MED ORDER — BIOTENE DRY MOUTH MT LIQD
15.0000 mL | Freq: Two times a day (BID) | OROMUCOSAL | Status: DC
  Administered 2012-07-27 – 2012-07-29 (×5): 15 mL via OROMUCOSAL

## 2012-07-26 MED ORDER — ACETYLCYSTEINE 20 % IN SOLN
4.0000 mL | Freq: Four times a day (QID) | RESPIRATORY_TRACT | Status: AC
  Administered 2012-07-26 – 2012-07-27 (×2): 4 mL via RESPIRATORY_TRACT
  Filled 2012-07-26 (×3): qty 4

## 2012-07-26 MED ORDER — FUROSEMIDE 10 MG/ML IJ SOLN
20.0000 mg | Freq: Once | INTRAMUSCULAR | Status: AC
  Administered 2012-07-26: 20 mg via INTRAVENOUS

## 2012-07-26 MED ORDER — CISATRACURIUM BOLUS VIA INFUSION
0.0500 mg/kg | Freq: Once | INTRAVENOUS | Status: DC | PRN
  Filled 2012-07-26: qty 7

## 2012-07-26 MED ORDER — DEXMEDETOMIDINE HCL IN NACL 200 MCG/50ML IV SOLN: 0.4000 ug/kg/h | INTRAVENOUS | Status: DC

## 2012-07-26 MED ORDER — VASOPRESSIN 20 UNIT/ML IJ SOLN
0.0300 [IU]/min | INTRAVENOUS | Status: DC
  Filled 2012-07-26: qty 2.5

## 2012-07-26 NOTE — Progress Notes (Signed)
Settings changed per MD ?

## 2012-07-26 NOTE — Progress Notes (Signed)
Around 0530, RN found pt obtunded with worsening swelling to face and scleral edema. Lung sounds rhonchous. BP 113/89 pulse 92, 79% on 2LNC. Rapid Response called. Pt placed in reverse trendelenburg and non-rebreather applied. Narcan 0.4mg  given x 2. Pt became more responsive. Respiratory drew ABG. Dr. Yetta Barre paged regarding pt's condition. STAT chest xray ordered and completed. Report called to 3300 while Dr. Yetta Barre assessed pt. Pt transferred to 3301 with rapid response.

## 2012-07-26 NOTE — Progress Notes (Signed)
Patient ID: Lindsey Rowe, female   DOB: 09-29-1945, 67 y.o.   MRN: [id number] Events/ ABG/ consult noted. PCO2 still quite high. Is it time for a bronch or intubation?

## 2012-07-26 NOTE — Progress Notes (Signed)
Patient ID: Lindsey Rowe, female   DOB: 07/16/1945, 67 y.o.   MRN: [id number] Events noted. Spoke at length with rapid response. Stas now 100% on NRB mask. Pt does have some chest pain and feels "cold." ABG with pCO2 of 71. Likely combo of narcotics and phenergan. Doubt PE or MI. Will hold on chest CT for now. CXR looks "fluufy." will try some lasix. Transfer to stepdown.

## 2012-07-26 NOTE — Progress Notes (Signed)
ABG resulted in critical value. PaCO2 82.3. RN made aware. Increased IPAP setting to 20 following ABG results.

## 2012-07-26 NOTE — Progress Notes (Signed)
ANTIBIOTIC CONSULT NOTE - INITIAL  Pharmacy Consult for zosyn Indication: rule out pneumonia  Allergies  Allergen Reactions  . Cefadroxil Itching    Ends of hair itched   . Gabapentin Other (See Comments)    Right foot and leg swelled     Patient Measurements: Height: 5' 3.5" (161.3 cm) Weight: 268 lb 11.9 oz (121.9 kg) IBW/kg (Calculated) : 53.55  Vital Signs: Temp: 98.6 F (37 C) (06/21 1600) Temp src: Oral (06/21 1600) BP: 150/83 mmHg (06/21 1600) Pulse Rate: 97 (06/21 1700) Intake/Output from previous day: 06/20 0701 - 06/21 0700 In: 2443.3 [I.V.:2043.3; IV Piggyback:400] Out: 1300 [Urine:1300] Intake/Output from this shift: Total I/O In: -  Out: 1125 [Urine:1125]  Labs: No results found for this basename: WBC, HGB, PLT, LABCREA, CREATININE,  in the last 72 hours Estimated Creatinine Clearance: 89.5 ml/min (by C-G formula based on Cr of 0.74). No results found for this basename: VANCOTROUGH, Leodis Binet, VANCORANDOM, GENTTROUGH, GENTPEAK, GENTRANDOM, TOBRATROUGH, TOBRAPEAK, TOBRARND, AMIKACINPEAK, AMIKACINTROU, AMIKACIN,  in the last 72 hours   Microbiology: Recent Results (from the past 720 hour(s))  SURGICAL PCR SCREEN     Status: None   Collection Time    07/21/12  3:45 PM      Result Value Range Status   MRSA, PCR NEGATIVE  NEGATIVE Final   Staphylococcus aureus NEGATIVE  NEGATIVE Final   Comment:            The Xpert SA Assay (FDA     approved for NASAL specimens     in patients over 21 years of age),     is one component of     a comprehensive surveillance     program.  Test performance has     been validated by [employer] for patients greater     than or equal to 10 year old.     It is not intended     to diagnose infection nor to     guide or monitor treatment.    Medical History: Past Medical History  Diagnosis Date  . Hypertension   . Bipolar 1 disorder   . Thyroid disease   . Back pain   . Hyperlipidemia   . Complication of  anesthesia     hard to wake up anesthesia  . Hypothyroidism   . Depression   . GERD (gastroesophageal reflux disease)   . Headache(784.0)     migraines and tension headaches  . Arthritis     Assessment: 67 yo female s/p right L3-4, L4-5 laminotomy, decompression intra and extraforaminal of the L3, L4, and L5 nerve roots. Noted to have change in mental status, hypoxia, hypercapnia on 6/21, PCCM consulted and adding zosyn to cover aspiration PNA. Chest XR - New right upper lobe and left perihilar infiltrates, afebrile. Noted pt. Is on d#4 vancomycin started post-op, now is d/c. Last scr = 0.74 on 6/18, est.crcl ~ 85 ml/min.   Plan:  - Start zosyn 3.375g IV Q 8 (4 hr infusion) - f/u renal function and cultures.  Bayard Hugger, PharmD, BCPS  Clinical Pharmacist  Pager: [phone]   07/26/2012,6:06 PM

## 2012-07-26 NOTE — Progress Notes (Signed)
CCM ordered TX to 3113, called report.

## 2012-07-26 NOTE — Progress Notes (Signed)
Point of Contact CCM note:  At bedside for intubation evaluation  Elevated CO2 with slow trending upward over the past 24 hours 71 to 78 to 82 despite bipap use  At bedside patient with stable hemodynamics  General: VSS Well developed obese white female in no acute distress,  Head: Eyes PERRLA, Normal cephalic and atramatic.  No icterus or scleral edema Lungs: Clear bilaterally to auscultation and percussion.  No wheezes rales or rhonchi, good air movement while on bipap  Heart: HRRR S1 S2 Pulses radial are 2+ & equal, no MRG No carotid bruit. No JVD. No abdominal bruits noted Abdomen: Bowel sounds are positive, abdomen soft and non-tender without masses, no hepatosplenomegaly Extremities: No clubbing, cyanosis or edema. DP +1; no evidence of volume overload  Neuro: Alert and oriented X 3. patient follows commands Psych:  Cheerful affect, responds appropriately  Changed Bipap from 20/6 to 22/12 Decreased Fio2 from 60% to 40%  Will recheck ABG in 2 hours; if continues to be an issue may require intubation.  Currently stable and appropriate.  If hemodynamics change, worsening mental status, or inability to continue to wear mask, expressed to nurse to alert CCM team.  Discussed with on call Pul/CCM  J. Derrel Nip, MD [id number]

## 2012-07-26 NOTE — Progress Notes (Addendum)
Pt arousable, fairly oriented. Tried contact ph# in chart. VSS, repeat ABG being drawn. Will continue to monitor. RR called to eval

## 2012-07-26 NOTE — Significant Event (Addendum)
Rapid Response Event Note Called to see pt with decreased LOC & resp distress Overview: Time Called: 0534 Arrival Time: 0536 Event Type: Respiratory  Initial Focused Assessment: On arrival pt is flat in bed, obtunded, RR 10, Sats 77% on 3l, gurgling in throat, fine crackles in bases.  Pt quickly placed on a NRB, Narcan given, & bed elevated to trendelenburg.  Pt's awakened & became more responsive, O2 sats 100% NRB, RR 25, HR 91, SBP 118.  Pt with significant facial & scleral edema also noted. ABG 7.23/71/185  Interventions: Narcan .4mg  IV x2 ABG PCXR Lasix IV Bipap Tx 3301  Event Summary: Name of Physician Notified: Dub Amis, MD at [phone]    at         [facility], [place] [facility] [place]

## 2012-07-26 NOTE — Progress Notes (Addendum)
eLink Physician-Brief Progress Note Patient Name: Lindsey Rowe DOB: December 02, 1945 MRN: [id number]  Date of Service  07/26/2012   HPI/Events of Note  lyubg flat on bipap   Recent Labs Lab 07/26/12 0617 07/26/12 0858  PHART 7.230* 7.241*  PCO2ART 71.0* 78.3*  PO2ART 185.0* 101.0*  HCO3 28.7* 32.4*  TCO2 30.8 34.8  O2SAT 99.4 97.5     eICU Interventions  Recheck abg stat  FU  Recent Labs Lab 07/26/12 0617 07/26/12 0858 07/26/12 1542  PHART 7.230* 7.241* 7.239*  PCO2ART 71.0* 78.3* 78.9*  PO2ART 185.0* 101.0* 149.0*  HCO3 28.7* 32.4* 32.6*  TCO2 30.8 34.8 35.0  O2SAT 99.4 97.5 98.9   Dr Yetta Barre called elink and due to unchanged resp acidosis  Is calling for CCM consult. Have informed Dr Craige Cotta      [place] 07/26/2012, 3:42 PM

## 2012-07-26 NOTE — Progress Notes (Signed)
ANTIBIOTIC CONSULT NOTE - INITIAL  Pharmacy Consult for vancomycin Indication: rule out pneumonia  Allergies  Allergen Reactions  . Cefadroxil Itching    Ends of hair itched   . Gabapentin Other (See Comments)    Right foot and leg swelled     Patient Measurements: Height: 5' 3.5" (161.3 cm) Weight: 268 lb 11.9 oz (121.9 kg) IBW/kg (Calculated) : 53.55  Vital Signs: Temp: 98.6 F (37 C) (06/21 1600) Temp src: Oral (06/21 1600) BP: 122/64 mmHg (06/21 2200) Pulse Rate: 90 (06/21 2200)  Labs:  Recent Labs  07/26/12 1846  WBC 18.5*  HGB 10.1*  PLT 205  CREATININE 0.74   Estimated Creatinine Clearance: 89.5 ml/min (by C-G formula based on Cr of 0.74).   Microbiology: Recent Results (from the past 720 hour(s))  SURGICAL PCR SCREEN     Status: None   Collection Time    07/21/12  3:45 PM      Result Value Range Status   MRSA, PCR NEGATIVE  NEGATIVE Final   Staphylococcus aureus NEGATIVE  NEGATIVE Final   Comment:            The Xpert SA Assay (FDA     approved for NASAL specimens     in patients over 9 years of age),     is one component of     a comprehensive surveillance     program.  Test performance has     been validated by [employer] for patients greater     than or equal to 44 year old.     It is not intended     to diagnose infection nor to     guide or monitor treatment.    Medical History: Past Medical History  Diagnosis Date  . Hypertension   . Bipolar 1 disorder   . Thyroid disease   . Back pain   . Hyperlipidemia   . Complication of anesthesia     hard to wake up anesthesia  . Hypothyroidism   . Depression   . GERD (gastroesophageal reflux disease)   . Headache(784.0)     migraines and tension headaches  . Arthritis     Medications:  Prescriptions prior to admission  Medication Sig Dispense Refill  . Aspirin-Acetaminophen-Caffeine 260-130-16 MG TABS Take 1 packet by mouth daily as needed. For headaches       . DULoxetine  (CYMBALTA) 60 MG capsule Take 60 mg by mouth daily.      [name] HYDROcodone-acetaminophen (VICODIN) 5-500 MG per tablet Take 1 tablet by mouth every 6 (six) hours as needed. For back pain       . levothyroxine (SYNTHROID, LEVOTHROID) 50 MCG tablet Take 50 mcg by mouth daily before breakfast.      . lisinopril (PRINIVIL,ZESTRIL) 10 MG tablet Take 20 mg by mouth daily.       [name] omeprazole (PRILOSEC) 10 MG capsule Take 10 mg by mouth 2 (two) times daily.       . pravastatin (PRAVACHOL) 10 MG tablet Take 20 mg by mouth at bedtime.       . SUMAtriptan (IMITREX) 25 MG tablet Take 25 mg by mouth every 2 (two) hours as needed for migraine.      . traZODone (DESYREL) 100 MG tablet Take 100-300 mg by mouth at bedtime as needed and may repeat dose one time if needed for sleep.        Scheduled:  . sodium chloride  2,000 mL Intravenous  Once  . acetylcysteine  4 mL Nebulization Q6H  . albuterol  2.5 mg Nebulization Q6H  . [START ON 07/27/2012] antiseptic oral rinse  15 mL Mouth Rinse q12n4p  . artificial tears  1 application Both Eyes Q8H  . chlorhexidine  15 mL Mouth Rinse BID  . heparin subcutaneous  5,000 Units Subcutaneous Q8H  . levothyroxine  25 mcg Intravenous Q breakfast  . pantoprazole (PROTONIX) IV  40 mg Intravenous Q24H  . piperacillin-tazobactam (ZOSYN)  IV  3.375 g Intravenous Q8H  . sodium chloride  3 mL Intravenous Q12H  . vancomycin  1,500 mg Intravenous Once  . [START ON 07/27/2012] vancomycin  1,500 mg Intravenous Q12H   Infusions:  . sodium chloride    . sodium chloride 100 mL/hr at 07/26/12 1900  . fentaNYL infusion INTRAVENOUS    . norepinephrine (LEVOPHED) Adult infusion      Assessment: 67yo female with acute hypoxic/hypercapnic respiratory failure likely due to aspiration PNA and narcotics now to broaden coverage with vanc.  Goal of Therapy:  Vancomycin trough level 15-20 mcg/ml  Plan:  Rec'd vanc 1g earlier; will continue with vancomycin 1500mg  IV Q12h and monitor CBC, Cx,  levels prn.  Vernard Gambles, PharmD, BCPS  07/26/2012,10:56 PM

## 2012-07-26 NOTE — Progress Notes (Signed)
Patient transferred from 4N via bed with rapid response RN. Patient immediately placed on bipap by RT. Patient sedated, arousable but oriented. Will continue to monitor. No family at bedside at this time.

## 2012-07-26 NOTE — Consult Note (Signed)
PULMONARY  / CRITICAL CARE MEDICINE  Name: Lindsey Rowe MRN: [id number] DOB: 1945/12/26    ADMISSION DATE:  07/23/2012 CONSULTATION DATE:  07/26/2012   REFERRING MD :  Marikay Alar  CHIEF COMPLAINT:  Hypercapnia  BRIEF PATIENT DESCRIPTION:  67 yo female admit with lumbar pain with radiation to Rt leg, and had right L3-4, L4-5 laminotomy, decompression intra and extraforaminal of the L3, L4, and L5 nerve roots.  Noted to have change in mental status, hypoxia, hypercapnia on 6/21 and PCCM consulted.   SIGNIFICANT EVENTS: 6/18 Laminotomy 6/21 Transfer to ICU  STUDIES:   LINES / TUBES:  CULTURES: Blood 6/21 >>  ANTIBIOTICS: Vancomycin 6/18 >> Zosyn 6/21 >>   HISTORY OF PRESENT ILLNESS:   67 yo female admitted for back surgery.  This was done w/o event on 6/18.  Post-op she has been required to remain in bed with head at 0 degrees.  She was noted to have decreased mental status, respiratory distress and hypoxia in AM of 6/21.  ABG showed hypercapnia.  She was given narcan, and mental status improved some.  She was started on BiPAP w/o improvement in ventilation.  She had chest xray which showed Rt upper lobe and Lt perihilar infiltrates with pulmonary congestion.  She was given lasix w/o improvement.  She was transferred to SDU, and PCCM consulted.  She c/o feeling smothered, and pain in her right chest.  She c/o chest congestion.  She denies nausea or abdominal pain.  PAST MEDICAL HISTORY :  Past Medical History  Diagnosis Date  . Hypertension   . Bipolar 1 disorder   . Thyroid disease   . Back pain   . Hyperlipidemia   . Complication of anesthesia     hard to wake up anesthesia  . Hypothyroidism   . Depression   . GERD (gastroesophageal reflux disease)   . Headache(784.0)     migraines and tension headaches  . Arthritis    Past Surgical History  Procedure Laterality Date  . Appendectomy    . Gastric bypass    . Back surgery    . Tonsillectomy    . Wrist surgery     . Eye surgery Bilateral     lens implant  . Nasal sinus surgery Bilateral   . Lumbar laminectomy/decompression microdiscectomy Right 07/23/2012    Procedure: LUMBAR LAMINECTOMY/DECOMPRESSION MICRODISCECTOMY 2 LEVELS;  Surgeon: Karn Cassis, MD;  Location: [facility];  Service: Neurosurgery;  Laterality: Right;  Right Lumbar three-four  lumbar four-five Laminectomy/Foraminotomy   Prior to Admission medications   Medication Sig Start Date End Date Taking? Authorizing Provider  Aspirin-Acetaminophen-Caffeine 260-130-16 MG TABS Take 1 packet by mouth daily as needed. For headaches    Yes Historical Provider, MD  DULoxetine (CYMBALTA) 60 MG capsule Take 60 mg by mouth daily.   Yes Historical Provider, MD  HYDROcodone-acetaminophen (VICODIN) 5-500 MG per tablet Take 1 tablet by mouth every 6 (six) hours as needed. For back pain    Yes Historical Provider, MD  levothyroxine (SYNTHROID, LEVOTHROID) 50 MCG tablet Take 50 mcg by mouth daily before breakfast.   Yes Historical Provider, MD  lisinopril (PRINIVIL,ZESTRIL) 10 MG tablet Take 20 mg by mouth daily.    Yes Historical Provider, MD  omeprazole (PRILOSEC) 10 MG capsule Take 10 mg by mouth 2 (two) times daily.    Yes Historical Provider, MD  pravastatin (PRAVACHOL) 10 MG tablet Take 20 mg by mouth at bedtime.    Yes Historical Provider, MD  SUMAtriptan (  IMITREX) 25 MG tablet Take 25 mg by mouth every 2 (two) hours as needed for migraine.   Yes Historical Provider, MD  traZODone (DESYREL) 100 MG tablet Take 100-300 mg by mouth at bedtime as needed and may repeat dose one time if needed for sleep.    Yes Historical Provider, MD   Allergies  Allergen Reactions  . Cefadroxil Itching    Ends of hair itched   . Gabapentin Other (See Comments)    Right foot and leg swelled     FAMILY HISTORY:  History reviewed. No pertinent family history. SOCIAL HISTORY:  reports that she has never smoked. She has never used smokeless tobacco. She reports  that  drinks alcohol. She reports that she does not use illicit drugs.  REVIEW OF SYSTEMS:   Negative except above.  SUBJECTIVE:   VITAL SIGNS: Temp:  [97.8 F (36.6 C)-98.6 F (37 C)] 98.6 F (37 C) (06/21 1600) Pulse Rate:  [86-113] 97 (06/21 1700) Resp:  [13-25] 18 (06/21 1700) BP: (92-151)/(52-89) 150/83 mmHg (06/21 1600) SpO2:  [79 %-100 %] 100 % (06/21 1700) FiO2 (%):  [60 %] 60 % (06/21 1200) Weight:  [268 lb 11.9 oz (121.9 kg)] 268 lb 11.9 oz (121.9 kg) (06/21 0655) HEMODYNAMICS:   VENTILATOR SETTINGS: BiPAP Vent Mode:  [-]  FiO2 (%):  [60 %] 60 %  INTAKE / OUTPUT: Intake/Output     06/20 0701 - 06/21 0700 06/21 0701 - 06/22 0700   P.O.     I.V. (mL/kg) 2043.3 (16.8)    IV Piggyback 400    Total Intake(mL/kg) 2443.3 (20)    Urine (mL/kg/hr) 1300 (0.4) 1125 (0.8)   Total Output 1300 1125   Net +1143.3 -1125          PHYSICAL EXAMINATION: General: Appears anxious Neuro:  Alert, follows commands, moves extremities HEENT:  BiPAP mask in place Cardiovascular:  s1s2 regular, tachycardic Lungs:  Decreased breath sounds Rt upper lung and left mid lung field anteriorly, b/l rales, no wheeze Abdomen: soft, non tender, decreased bowel sounds Musculoskeletal:  No edema Skin:  No rashes  LABS:  Recent Labs Lab 07/21/12 1545 07/23/12 1642 07/26/12 0617 07/26/12 0816 07/26/12 0858 07/26/12 1542  HGB 13.8 11.7*  --   --   --   --   WBC 10.1 16.5*  --   --   --   --   PLT 247 228  --   --   --   --   NA 141  --   --   --   --   --   K 5.1  --   --   --   --   --   CL 99  --   --   --   --   --   CO2 31  --   --   --   --   --   GLUCOSE 97  --   --   --   --   --   BUN 16  --   --   --   --   --   CREATININE 0.95 0.74  --   --   --   --   CALCIUM 9.6  --   --   --   --   --   TROPONINI  --   --   --  <0.30  --   --   PHART  --   --  7.230*  --  7.241* 7.239*  PCO2ART  --   --  71.0*  --  78.3* 78.9*  PO2ART  --   --  185.0*  --  101.0* 149.0*   No  results found for this basename: GLUCAP,  in the last 168 hours  Imaging: Dg Chest Port 1 View  07/26/2012   *RADIOLOGY REPORT*  Clinical Data: Acute respiratory distress, post lumbar laminectomy 07/23/2012  PORTABLE CHEST - 1 VIEW  Comparison: Portable exam 0605 hours compared to 07/21/2012  Findings: Upper normal heart size. New right upper lobe and left perihilar infiltrates since previous exam. Decreased lung volumes with atelectasis at right base. No pneumothorax or acute osseous findings.  IMPRESSION: New right upper lobe and left perihilar infiltrates. Right basilar atelectasis.   Original Report Authenticated By: Ulyses Southward, M.D.     ASSESSMENT / PLAN:  PULMONARY A: Acute hypoxic/hypercapnic respiratory failure likely from aspiration pneumonia and narcotics. P:   -transfer to ICU -f/u CXR >> may need bronchoscopy -continue BiPAP for now >> high risk for intubation -f/u ABG -add albuterol, mucomyst, flutter valve for now  CARDIOVASCULAR A:  Low blood pressure 6/21. Hx of HTN, Hyperlipidemia. P:  -hold lisinopril, pravachol for now -continue IV fluids  RENAL A: No issues. P:   -keep foley in due to critical illness -f/u renal fx, urine outpt, electrolytes  GASTROINTESTINAL A:  Nutrition. P:   -NPO for now -Protonix for SUP  HEMATOLOGIC A: No issues. P:  -f/u CBC -SQ heparin for DVT prevention  INFECTIOUS A: Concern for aspiraton pneumonia. P:   -vancomycin, zosyn -check cultures  ENDOCRINE A:  Hx of hypothyroidism. P:   -synthroid   NEUROLOGIC A:  S/p Lumbar spine surgery. Hx of Bipolar, depression, migraine headaches. P:   -okay to elevated HOB on 6/21 per neurosurgery -post-op care per neurosurgery -limit sedatives/narcotics -hold cymbalta, trazodone for now  Summary: She has respiratory failure from probable aspiration pneumonia.  Clinically looks better than what would be expected from ABG.  Will continue BiPAP and close monitoring in ICU.   Will expand antibiotic coverage, and bronchial hygiene regimen.  Will f/u ABG >> depending on results and clinical status will determine if intubation needed.  CC time 50 minutes.  Coralyn Helling, MD [facility] Pulmonary/Critical Care 07/26/2012, 6:37 PM Pager:  [phone] After 3pm call: [phone]

## 2012-07-27 LAB — BASIC METABOLIC PANEL
BUN: 11 mg/dL (ref 6–23)
CO2: 31 mEq/L (ref 19–32)
Calcium: 8.6 mg/dL (ref 8.4–10.5)
Chloride: 99 mEq/L (ref 96–112)
Creatinine, Ser: 0.71 mg/dL (ref 0.50–1.10)
GFR calc Af Amer: >90 mL/min (ref >90)
GFR calc non Af Amer: 89 mL/min — ABNORMAL LOW (ref >90)
Glucose, Bld: 100 mg/dL — ABNORMAL HIGH (ref 70–99)
Potassium: 3.9 mEq/L (ref 3.5–5.1)
Sodium: 137 mEq/L (ref 135–145)

## 2012-07-27 LAB — BLOOD GAS, ARTERIAL
Acid-Base Excess: 6.1 mmol/L — ABNORMAL HIGH (ref 0.0–2.0)
Acid-Base Excess: 6.8 mmol/L — ABNORMAL HIGH (ref 0.0–2.0)
Acid-Base Excess: 7 mmol/L — ABNORMAL HIGH (ref 0.0–2.0)
Bicarbonate: 32.7 mEq/L — ABNORMAL HIGH (ref 20.0–24.0)
Bicarbonate: 33 mEq/L — ABNORMAL HIGH (ref 20.0–24.0)
Bicarbonate: 33.2 mEq/L — ABNORMAL HIGH (ref 20.0–24.0)
Delivery systems: POSITIVE
Delivery systems: POSITIVE
Delivery systems: POSITIVE
Drawn by: 28701
Drawn by: 352351
Drawn by: 352351
Expiratory PAP: 12
Expiratory PAP: 6
Expiratory PAP: 6
FIO2: 0.4 %
FIO2: 0.4 %
FIO2: 40 %
Inspiratory PAP: 22
Inspiratory PAP: 22
Inspiratory PAP: 22
Mode: POSITIVE
Mode: POSITIVE
O2 Saturation: 97.2 %
O2 Saturation: 97.7 %
O2 Saturation: 97.9 %
Patient temperature: 98.6
Patient temperature: 98.6
Patient temperature: 98.6
RATE: 22 resp/min
TCO2: 35 mmol/L (ref 0–100)
TCO2: 35.1 mmol/L (ref 0–100)
TCO2: 35.3 mmol/L (ref 0–100)
pCO2 arterial: 67.3 mmHg (ref 35.0–45.0)
pCO2 arterial: 71.1 mmHg (ref 35.0–45.0)
pCO2 arterial: 73.6 mmHg (ref 35.0–45.0)
pH, Arterial: 7.271 — ABNORMAL LOW (ref 7.350–7.450)
pH, Arterial: 7.29 — ABNORMAL LOW (ref 7.350–7.450)
pH, Arterial: 7.312 — ABNORMAL LOW (ref 7.350–7.450)
pO2, Arterial: 110 mmHg — ABNORMAL HIGH (ref 80.0–100.0)
pO2, Arterial: 93.7 mmHg (ref 80.0–100.0)
pO2, Arterial: 99.3 mmHg (ref 80.0–100.0)

## 2012-07-27 LAB — GLUCOSE, CAPILLARY: Glucose-Capillary: 99 mg/dL (ref 70–99)

## 2012-07-27 MED ORDER — SODIUM CHLORIDE 0.9 % IJ SOLN
10.0000 mL | Freq: Two times a day (BID) | INTRAMUSCULAR | Status: DC
  Administered 2012-07-27 – 2012-07-28 (×2): 10 mL
  Administered 2012-07-28: 3 mL
  Administered 2012-07-29: 20 mL
  Administered 2012-07-29: 10 mL

## 2012-07-27 MED ORDER — ACETAMINOPHEN 325 MG PO TABS
650.0000 mg | ORAL_TABLET | Freq: Four times a day (QID) | ORAL | Status: DC | PRN
  Administered 2012-08-04: 650 mg via ORAL
  Filled 2012-07-27: qty 2

## 2012-07-27 MED ORDER — LEVOTHYROXINE SODIUM 50 MCG PO TABS
50.0000 ug | ORAL_TABLET | Freq: Every day | ORAL | Status: DC
  Administered 2012-07-27 – 2012-08-04 (×9): 50 ug via ORAL
  Filled 2012-07-27 (×10): qty 1

## 2012-07-27 MED ORDER — VANCOMYCIN HCL 10 G IV SOLR
1250.0000 mg | Freq: Two times a day (BID) | INTRAVENOUS | Status: DC
  Administered 2012-07-27 – 2012-07-28 (×2): 1250 mg via INTRAVENOUS
  Filled 2012-07-27 (×4): qty 1250

## 2012-07-27 MED ORDER — PANTOPRAZOLE SODIUM 40 MG PO TBEC
40.0000 mg | DELAYED_RELEASE_TABLET | Freq: Every day | ORAL | Status: DC
  Administered 2012-07-27: 40 mg via ORAL
  Filled 2012-07-27: qty 1

## 2012-07-27 MED ORDER — SODIUM CHLORIDE 0.9 % IJ SOLN
10.0000 mL | INTRAMUSCULAR | Status: DC | PRN
  Administered 2012-07-30: 10 mL
  Administered 2012-07-31: 20 mL
  Administered 2012-08-01 – 2012-08-02 (×3): 10 mL

## 2012-07-27 MED ORDER — DULOXETINE HCL 60 MG PO CPEP
60.0000 mg | ORAL_CAPSULE | Freq: Every day | ORAL | Status: DC
  Administered 2012-07-27 – 2012-08-04 (×9): 60 mg via ORAL
  Filled 2012-07-27 (×9): qty 1

## 2012-07-27 NOTE — Progress Notes (Signed)
ANTIBIOTIC CONSULT NOTE - Follow-up  Pharmacy Consult for vancomycin Indication: rule out pneumonia  Allergies  Allergen Reactions  . Cefadroxil Itching    Ends of hair itched   . Gabapentin Other (See Comments)    Right foot and leg swelled     Patient Measurements: Height: 5' 3.5" (161.3 cm) Weight: 277 lb 5.4 oz (125.8 kg) IBW/kg (Calculated) : 53.55  Vital Signs: Temp: 98.1 F (36.7 C) (06/22 1200) Temp src: Axillary (06/22 1200) BP: 127/64 mmHg (06/22 1400) Pulse Rate: 101 (06/22 1410)  Labs:  Recent Labs  07/26/12 1846 07/27/12 0525  WBC 18.5*  --   HGB 10.1*  --   PLT 205  --   CREATININE 0.74 0.71   Estimated Creatinine Clearance: 91.3 ml/min (by C-G formula based on Cr of 0.71).  Assessment: 67yo F with acute hypoxic/hypercapnic respiratory failure likely due to aspiration PNA and narcotics.  Discussed antibiotic plans with Dr Craige Cotta who confirmed that patient is to continue on vancomycin and zosyn.  WBC elevated at 18.5, afeb today.  6/18 vanc (MD 1g q12h)>6/21, resumed 6/22 6/20 zosyn (Rx)>>  6/21 Bld x 2: pending  Goal of Therapy:  Vancomycin trough level 15-20 mcg/ml  Plan:  - Vancomycin 1250mg  IV q12h - Zosyn 3.375G IV q8h to be infused over 4 hours - Follow up SCr, UOP, cultures, clinical course and adjust as clinically indicated  Rhylan Kagel L. Illene Bolus, PharmD, BCPS Clinical Pharmacist Pager: [phone] Pharmacy: [phone] 07/27/2012 3:08 PM

## 2012-07-27 NOTE — Progress Notes (Signed)
Peripherally Inserted Central Catheter/Midline Placement  The IV Nurse has discussed with the patient and/or persons authorized to consent for the patient, the purpose of this procedure and the potential benefits and risks involved with this procedure.  The benefits include less needle sticks, lab draws from the catheter and patient may be discharged home with the catheter.  Risks include, but not limited to, infection, bleeding, blood clot (thrombus formation), and puncture of an artery; nerve damage and irregular heat beat.  Alternatives to this procedure were also discussed.  PICC/Midline Placement Documentation  PICC / Midline Double Lumen 07/27/12 PICC Right Basilic (Active)       Lindsey Rowe 07/27/2012, 12:22 PM

## 2012-07-27 NOTE — Progress Notes (Signed)
Removed pt from BIPAP per MD order.  Placed pt on 100% NRB.  Pt tolerating well at this time. VS WNL  Bipap to be used PRN and HS.

## 2012-07-27 NOTE — Progress Notes (Signed)
Patient ID: SHELBEE APGAR, female   DOB: 10-04-1945, 67 y.o.   MRN: [id number] Patient still on the mask looks better, and PCO2 is down to 67. Complains of back pain but no leg pain her headache. Moves legs well. Dressing dry. Appreciate excellent care by critical care medicine. Now on antibiotics for pneumonia.

## 2012-07-27 NOTE — Progress Notes (Signed)
Increased mandatory rate on BiPAP to 22 following ABG.

## 2012-07-27 NOTE — Progress Notes (Signed)
PULMONARY  / CRITICAL CARE MEDICINE  Name: Lindsey Rowe MRN: [id number] DOB: 1945-11-23    ADMISSION DATE:  07/23/2012 CONSULTATION DATE:  07/26/2012   REFERRING MD :  Marikay Alar  CHIEF COMPLAINT:  Hypercapnia  BRIEF PATIENT DESCRIPTION:  67 yo female admit with lumbar pain with radiation to Rt leg, and had right L3-4, L4-5 laminotomy, decompression intra and extraforaminal of the L3, L4, and L5 nerve roots.  Noted to have change in mental status, hypoxia, hypercapnia on 6/21 and PCCM consulted.   SIGNIFICANT EVENTS: 6/18 Laminotomy 6/21 Transfer to ICU  STUDIES:   LINES / TUBES:  CULTURES: Blood 6/21 >>  ANTIBIOTICS: Vancomycin 6/18 >> Zosyn 6/21 >>   SUBJECTIVE:  Breathing better.  Not having chest pain.  Denies nausea.  VITAL SIGNS: Temp:  [98.1 F (36.7 C)-98.6 F (37 C)] 98.1 F (36.7 C) (06/22 0027) Pulse Rate:  [81-102] 81 (06/22 0530) Resp:  [12-25] 19 (06/22 0530) BP: (92-151)/(49-83) 126/56 mmHg (06/22 0530) SpO2:  [95 %-100 %] 98 % (06/22 0530) FiO2 (%):  [40 %-60 %] 40 % (06/22 0500) Weight:  [277 lb 5.4 oz (125.8 kg)] 277 lb 5.4 oz (125.8 kg) (06/22 0430) VENTILATOR SETTINGS: BiPAP Vent Mode:  [-]  FiO2 (%):  [40 %-60 %] 40 %  INTAKE / OUTPUT: Intake/Output     06/21 0701 - 06/22 0700 06/22 0701 - 06/23 0700   I.V. (mL/kg) 1100 (8.7)    IV Piggyback     Total Intake(mL/kg) 1100 (8.7)    Urine (mL/kg/hr) 1545 (0.5)    Total Output 1545     Net -445            PHYSICAL EXAMINATION: General: no distress Neuro:  Alert, follows commands, moves extremities HEENT:  BiPAP mask in place Cardiovascular:  s1s2 regular Lungs: better air movement, b/l rhonchi, no wheeze Abdomen: soft, non tender, decreased bowel sounds Musculoskeletal:  No edema Skin:  No rashes  LABS:  Recent Labs Lab 07/21/12 1545 07/23/12 1642 07/26/12 0617 07/26/12 0816  07/26/12 1846  07/26/12 1955 07/26/12 2350 07/27/12 0117 07/27/12 0525 07/27/12 0621  HGB  13.8 11.7*  --   --   --  10.1*  --   --   --   --   --   --   WBC 10.1 16.5*  --   --   --  18.5*  --   --   --   --   --   --   PLT 247 228  --   --   --  205  --   --   --   --   --   --   NA 141  --   --   --   --  137  --   --   --   --  137  --   K 5.1  --   --   --   --  4.2  --   --   --   --  3.9  --   CL 99  --   --   --   --  99  --   --   --   --  99  --   CO2 31  --   --   --   --  33*  --   --   --   --  31  --   GLUCOSE 97  --   --   --   --  124*  --   --   --   --  100*  --   BUN 16  --   --   --   --  10  --   --   --   --  11  --   CREATININE 0.95 0.74  --   --   --  0.74  --   --   --   --  0.71  --   CALCIUM 9.6  --   --   --   --  8.9  --   --   --   --  8.6  --   AST  --   --   --   --   --  21  --   --   --   --   --   --   ALT  --   --   --   --   --  17  --   --   --   --   --   --   ALKPHOS  --   --   --   --   --  67  --   --   --   --   --   --   BILITOT  --   --   --   --   --  0.3  --   --   --   --   --   --   PROT  --   --   --   --   --  6.0  --   --   --   --   --   --   ALBUMIN  --   --   --   --   --  2.6*  --   --   --   --   --   --   TROPONINI  --   --   --  <0.30  --   --   --  <0.30  --   --   --   --   PHART  --   --  7.230*  --   < >  --   < >  --  7.271* 7.290*  --  7.312*  PCO2ART  --   --  71.0*  --   < >  --   < >  --  73.6* 71.1*  --  67.3*  PO2ART  --   --  185.0*  --   < >  --   < >  --  110.0* 93.7  --  99.3  < > = values in this interval not displayed. No results found for this basename: GLUCAP,  in the last 168 hours  Imaging: Dg Chest Port 1 View  07/26/2012   *RADIOLOGY REPORT*  Clinical Data: Respiratory distress  PORTABLE CHEST - 1 VIEW  Comparison: 07/26/2012  Findings: There is mild cardiac enlargement.  The lung volumes are low and there is asymmetric elevation of the right hemidiaphragm. Persistent right upper lobe airspace consolidation and left lung perihilar infiltrates.  New hazy lung opacities are identified within the right  lower lobe.  IMPRESSION:  1.  No change and right upper lobe consolidation. 2.  Worsening aeration to the right base.   Original Report Authenticated By: Signa Kell, M.D.   Dg Chest Port 1 View  07/26/2012   *RADIOLOGY REPORT*  Clinical Data: Acute respiratory distress, post lumbar laminectomy 07/23/2012  PORTABLE CHEST - 1 VIEW  Comparison: Portable exam 0605 hours compared to 07/21/2012  Findings: Upper normal heart size. New right upper lobe and left perihilar infiltrates since previous exam. Decreased lung volumes with atelectasis at right base. No pneumothorax or acute osseous findings.  IMPRESSION: New right upper lobe and left perihilar infiltrates. Right basilar atelectasis.   Original Report Authenticated By: Ulyses Southward, M.D.     ASSESSMENT / PLAN:  PULMONARY A: Acute hypoxic/hypercapnic respiratory failure likely from aspiration pneumonia and narcotics >> improved with BiPAP P:   -f/u CXR -BiPAP qhs and prn during day -adjust oxygen to keep SpO2 > 92% -limit ABG's -continue albuterol, mucomyst, flutter valve for now  CARDIOVASCULAR A:  Low blood pressure 6/21 >> improved 6/22. Hx of HTN, Hyperlipidemia. P:  -hold lisinopril, pravachol for now -continue IV fluids >> goal even fluid balance  RENAL A: No issues. P:   -keep foley in due to critical illness -f/u renal fx, urine outpt, electrolytes  GASTROINTESTINAL A:  Nutrition. P:   -clear liquids for now until respiratory status more stable -Protonix for SUP  HEMATOLOGIC A: Leukocytosis in setting of pneumonia. Anemia. P:  -f/u CBC -SQ heparin for DVT prevention  INFECTIOUS A: Concern for aspiraton pneumonia. P:   -continue vancomycin, zosyn -check cultures  ENDOCRINE A:  Hx of hypothyroidism. P:   -synthroid   NEUROLOGIC A:  S/p Lumbar spine surgery. Hx of Bipolar, depression, migraine headaches. P:   -okay to elevated HOB on 6/21 per neurosurgery -post-op care per neurosurgery -limit  sedatives/narcotics -resume cymbalta -hold trazodone for now  CC time 35 minutes  Coralyn Helling, MD [facility] Pulmonary/Critical Care 07/27/2012, 7:21 AM Pager:  [phone] After 3pm call: [phone]

## 2012-07-28 ENCOUNTER — Inpatient Hospital Stay (HOSPITAL_COMMUNITY): Payer: Medicare Other

## 2012-07-28 LAB — CBC
HCT: 25.2 % — ABNORMAL LOW (ref 36.0–46.0)
Hemoglobin: 8.3 g/dL — ABNORMAL LOW (ref 12.0–15.0)
MCH: 32.7 pg (ref 26.0–34.0)
MCHC: 32.9 g/dL (ref 30.0–36.0)
MCV: 99.2 fL (ref 78.0–100.0)
Platelets: 175 10*3/uL (ref 150–400)
RBC: 2.54 MIL/uL — ABNORMAL LOW (ref 3.87–5.11)
RDW: 13.9 % (ref 11.5–15.5)
WBC: 9.8 10*3/uL (ref 4.0–10.5)

## 2012-07-28 LAB — BASIC METABOLIC PANEL
BUN: 14 mg/dL (ref 6–23)
CO2: 36 mEq/L — ABNORMAL HIGH (ref 19–32)
Calcium: 8.6 mg/dL (ref 8.4–10.5)
Chloride: 101 mEq/L (ref 96–112)
Creatinine, Ser: 0.72 mg/dL (ref 0.50–1.10)
GFR calc Af Amer: >90 mL/min (ref >90)
GFR calc non Af Amer: 88 mL/min — ABNORMAL LOW (ref >90)
Glucose, Bld: 105 mg/dL — ABNORMAL HIGH (ref 70–99)
Potassium: 3.6 mEq/L (ref 3.5–5.1)
Sodium: 140 mEq/L (ref 135–145)

## 2012-07-28 LAB — GLUCOSE, CAPILLARY
Glucose-Capillary: 100 mg/dL — ABNORMAL HIGH (ref 70–99)
Glucose-Capillary: 104 mg/dL — ABNORMAL HIGH (ref 70–99)
Glucose-Capillary: 108 mg/dL — ABNORMAL HIGH (ref 70–99)
Glucose-Capillary: 109 mg/dL — ABNORMAL HIGH (ref 70–99)
Glucose-Capillary: 96 mg/dL (ref 70–99)
Glucose-Capillary: 97 mg/dL (ref 70–99)

## 2012-07-28 MED ORDER — ONDANSETRON HCL 4 MG/2ML IJ SOLN: 4.0000 mg | INTRAMUSCULAR | Status: DC | PRN

## 2012-07-28 MED ORDER — ONDANSETRON HCL 4 MG/2ML IJ SOLN: 4.0000 mg | Freq: Four times a day (QID) | INTRAMUSCULAR | Status: DC | PRN

## 2012-07-28 MED ORDER — MORPHINE SULFATE 2 MG/ML IJ SOLN
2.0000 mg | INTRAMUSCULAR | Status: DC | PRN
  Administered 2012-07-28 (×3): 2 mg via INTRAVENOUS
  Filled 2012-07-28 (×4): qty 1

## 2012-07-28 NOTE — Progress Notes (Signed)
UR Completed.  Lindsey Rowe Jane [phone] 07/28/2012  

## 2012-07-28 NOTE — Progress Notes (Signed)
PULMONARY  / CRITICAL CARE MEDICINE  Name: Lindsey Rowe MRN: [id number] DOB: May 21, 1945    ADMISSION DATE:  07/23/2012 CONSULTATION DATE:  07/26/2012   REFERRING MD :  Marikay Alar  CHIEF COMPLAINT:  Hypercapnia  BRIEF PATIENT DESCRIPTION:  67 yo female admit with lumbar pain with radiation to Rt leg, and had right L3-4, L4-5 laminotomy, decompression intra and extraforaminal of the L3, L4, and L5 nerve roots.  Noted to have change in mental status, hypoxia, hypercapnia on 6/21 and PCCM consulted.   SIGNIFICANT EVENTS: 6/18 Laminotomy 6/21 Transfer to ICU  STUDIES:   LINES / TUBES:  CULTURES: Blood 6/21 >>  ANTIBIOTICS: Vancomycin 6/18 >> 6/23 Zosyn 6/21 >>  PCT 6/23:   SUBJECTIVE:  No new complaints. No distress  VITAL SIGNS: Temp:  [97 F (36.1 C)-98.4 F (36.9 C)] 98 F (36.7 C) (06/23 1200) Pulse Rate:  [87-101] 98 (06/23 1300) Resp:  [14-25] 19 (06/23 1300) BP: (107-153)/(62-89) 152/77 mmHg (06/23 1300) SpO2:  [92 %-100 %] 96 % (06/23 1300) FiO2 (%):  [40 %-100 %] 40 % (06/23 1200) Weight:  [125.4 kg (276 lb 7.3 oz)] 125.4 kg (276 lb 7.3 oz) (06/23 0500) VENTILATOR SETTINGS: BiPAP Vent Mode:  [-]  FiO2 (%):  [40 %-100 %] 40 %  INTAKE / OUTPUT: Intake/Output     06/22 0701 - 06/23 0700 06/23 0701 - 06/24 0700   P.O.  160   I.V. (mL/kg) 1223.3 (9.8) 250 (2)   IV Piggyback 677 25   Total Intake(mL/kg) 1900.3 (15.2) 435 (3.5)   Urine (mL/kg/hr) 2185 (0.7) 205 (0.2)   Total Output 2185 205   Net -284.7 +230          PHYSICAL EXAMINATION: General: no distress Neuro:  Alert, follows commands, moves extremities HEENT:  BiPAP mask in place Cardiovascular:  s1s2 regular Lungs: clear anteriorly Abdomen: soft, non tender, decreased bowel sounds Musculoskeletal:  No edema Skin:  No rashes  LABS:  Recent Labs Lab 07/23/12 1642 07/26/12 0617 07/26/12 0816  07/26/12 1846  07/26/12 1955 07/26/12 2350 07/27/12 0117 07/27/12 0525 07/27/12 0621  07/28/12 0400  HGB 11.7*  --   --   --  10.1*  --   --   --   --   --   --  8.3*  WBC 16.5*  --   --   --  18.5*  --   --   --   --   --   --  9.8  PLT 228  --   --   --  205  --   --   --   --   --   --  175  NA  --   --   --   --  137  --   --   --   --  137  --  140  K  --   --   --   --  4.2  --   --   --   --  3.9  --  3.6  CL  --   --   --   --  99  --   --   --   --  99  --  101  CO2  --   --   --   --  33*  --   --   --   --  31  --  36*  GLUCOSE  --   --   --   --  124*  --   --   --   --  100*  --  105*  BUN  --   --   --   --  10  --   --   --   --  11  --  14  CREATININE 0.74  --   --   --  0.74  --   --   --   --  0.71  --  0.72  CALCIUM  --   --   --   --  8.9  --   --   --   --  8.6  --  8.6  AST  --   --   --   --  21  --   --   --   --   --   --   --   ALT  --   --   --   --  17  --   --   --   --   --   --   --   ALKPHOS  --   --   --   --  67  --   --   --   --   --   --   --   BILITOT  --   --   --   --  0.3  --   --   --   --   --   --   --   PROT  --   --   --   --  6.0  --   --   --   --   --   --   --   ALBUMIN  --   --   --   --  2.6*  --   --   --   --   --   --   --   TROPONINI  --   --  <0.30  --   --   --  <0.30  --   --   --   --   --   PHART  --  7.230*  --   < >  --   < >  --  7.271* 7.290*  --  7.312*  --   PCO2ART  --  71.0*  --   < >  --   < >  --  73.6* 71.1*  --  67.3*  --   PO2ART  --  185.0*  --   < >  --   < >  --  110.0* 93.7  --  99.3  --   < > = values in this interval not displayed.  Recent Labs Lab 07/27/12 2025 07/28/12 0006 07/28/12 0347 07/28/12 0732 07/28/12 1126  GLUCAP 99 96 100* 97 109*   CXR: Much improved aeration    ASSESSMENT / PLAN:  PULMONARY A: Acute hypoxic/hypercapnic respiratory failure, much improved Possibleaspiration pneumonia P:   -D/C BiPAP -Cont supplemental O2  -Follow CXR intermittently  CARDIOVASCULAR A: Hypotension, resolved Hx of HTN, Hyperlipidemia. P:  -hold home anti-hypertensives   -Monitor  RENAL A: No issues. P:   D/C Foley  GASTROINTESTINAL A:  Nutrition. P:   -advance diet  HEMATOLOGIC A: Anemia with abrupt drop 6/23, no overt bleeding. No indication for PRBCs P:  -Recheck CBC intermittently  INFECTIOUS A: Suspected aspiraton pneumonia. P:   -Micro and abx as above  ENDOCRINE A:  Hx of hypothyroidism. P:   -Cont  synthroid   NEUROLOGIC A:  S/p Lumbar spine surgery. Severe Post op pain Hx of Bipolar, depression, migraine headaches. P:   -Mobilize PT consult 6/23 Cont analgesics  Watch in ICU X 24 hrs more Discussed with Dr Gwinda Maine, MD ; [facility] service [place] [phone].  After 5:30 PM or weekends, call [phone]

## 2012-07-28 NOTE — Progress Notes (Signed)
Patient ID: Lindsey Rowe, female   DOB: 08-Nov-1945, 67 y.o.   MRN: seen this am with dr Sharol Harness.  events of the weekend read. Neuro no headache but incisional pain. No weakness. still some sensory changes in the right leg. Foley to be discontinued and oob. Continue as per CCM

## 2012-07-29 LAB — CBC
HCT: 24.8 % — ABNORMAL LOW (ref 36.0–46.0)
Hemoglobin: 8 g/dL — ABNORMAL LOW (ref 12.0–15.0)
MCH: 32 pg (ref 26.0–34.0)
MCHC: 32.3 g/dL (ref 30.0–36.0)
MCV: 99.2 fL (ref 78.0–100.0)
Platelets: 205 10*3/uL (ref 150–400)
RBC: 2.5 MIL/uL — ABNORMAL LOW (ref 3.87–5.11)
RDW: 14.1 % (ref 11.5–15.5)
WBC: 9.4 10*3/uL (ref 4.0–10.5)

## 2012-07-29 LAB — PROCALCITONIN: Procalcitonin: <0.1 ng/mL

## 2012-07-29 MED ORDER — BISACODYL 10 MG RE SUPP
10.0000 mg | Freq: Every day | RECTAL | Status: DC | PRN
  Administered 2012-08-02: 10 mg via RECTAL
  Filled 2012-07-29: qty 1

## 2012-07-29 MED ORDER — BIOTENE DRY MOUTH MT LIQD
15.0000 mL | Freq: Two times a day (BID) | OROMUCOSAL | Status: DC
  Administered 2012-07-29 – 2012-08-04 (×11): 15 mL via OROMUCOSAL

## 2012-07-29 MED ORDER — OXYCODONE HCL 5 MG PO TABS
10.0000 mg | ORAL_TABLET | ORAL | Status: DC | PRN
  Administered 2012-07-29 – 2012-07-31 (×7): 10 mg via ORAL
  Administered 2012-07-31: 5 mg via ORAL
  Administered 2012-08-01 – 2012-08-04 (×12): 10 mg via ORAL
  Filled 2012-07-29 (×18): qty 2
  Filled 2012-07-29: qty 1
  Filled 2012-07-29: qty 2

## 2012-07-29 NOTE — Progress Notes (Signed)
PULMONARY  / CRITICAL CARE MEDICINE  Name: Lindsey Rowe MRN: [id number] DOB: September 24, 1945    ADMISSION DATE:  07/23/2012 CONSULTATION DATE:  07/26/2012   REFERRING MD :  Marikay Alar  CHIEF COMPLAINT:  Hypercapnia  BRIEF PATIENT DESCRIPTION:  67 yo female admit with lumbar pain with radiation to Rt leg, and had right L3-4, L4-5 laminotomy, decompression intra and extraforaminal of the L3, L4, and L5 nerve roots.  Noted to have change in mental status, hypoxia, hypercapnia on 6/21 and PCCM consulted.   SIGNIFICANT EVENTS: 6/18 Laminotomy 6/21 Transfer to ICU  STUDIES:   LINES / TUBES:  CULTURES: Blood 6/21 >>  ANTIBIOTICS: Vancomycin 6/18 >> 6/23 Zosyn 6/21 >> 6/24 PCT 6/23: < 0.10  SUBJECTIVE:  No new complaints. No distress  VITAL SIGNS: Temp:  [97.6 F (36.4 C)-98.1 F (36.7 C)] 97.8 F (36.6 C) (06/24 1200) Pulse Rate:  [73-100] 79 (06/24 1500) Resp:  [14-22] 15 (06/24 1500) BP: (108-147)/(63-100) 135/79 mmHg (06/24 1500) SpO2:  [94 %-100 %] 99 % (06/24 1500) VENTILATOR SETTINGS: BiPAP    INTAKE / OUTPUT: Intake/Output     06/23 0701 - 06/24 0700 06/24 0701 - 06/25 0700   P.O. 400 740   I.V. (mL/kg) 770 (6.1)    IV Piggyback 125 37.5   Total Intake(mL/kg) 1295 (10.3) 777.5 (6.2)   Urine (mL/kg/hr) 665 (0.2) 100 (0.1)   Total Output 665 100   Net +630 +677.5          PHYSICAL EXAMINATION: General: no distress Neuro:  Alert, follows commands, moves extremities HEENT:  BiPAP mask in place Cardiovascular:  s1s2 regular Lungs: clear anteriorly Abdomen: soft, non tender, decreased bowel sounds Musculoskeletal:  No edema Skin:  No rashes  LABS:  Recent Labs Lab 07/26/12 0617 07/26/12 0816  07/26/12 1846  07/26/12 1955 07/26/12 2350 07/27/12 0117 07/27/12 0525 07/27/12 0621 07/28/12 0400 07/29/12 0500  HGB  --   --   --  10.1*  --   --   --   --   --   --  8.3* 8.0*  WBC  --   --   --  18.5*  --   --   --   --   --   --  9.8 9.4  PLT  --    --   --  205  --   --   --   --   --   --  175 205  NA  --   --   --  137  --   --   --   --  137  --  140  --   K  --   --   < > 4.2  --   --   --   --  3.9  --  3.6  --   CL  --   --   --  99  --   --   --   --  99  --  101  --   CO2  --   --   --  33*  --   --   --   --  31  --  36*  --   GLUCOSE  --   --   --  124*  --   --   --   --  100*  --  105*  --   BUN  --   --   --  10  --   --   --   --  11  --  14  --   CREATININE  --   --   --  0.74  --   --   --   --  0.71  --  0.72  --   CALCIUM  --   --   --  8.9  --   --   --   --  8.6  --  8.6  --   AST  --   --   --  21  --   --   --   --   --   --   --   --   ALT  --   --   --  17  --   --   --   --   --   --   --   --   ALKPHOS  --   --   --  67  --   --   --   --   --   --   --   --   BILITOT  --   --   --  0.3  --   --   --   --   --   --   --   --   PROT  --   --   --  6.0  --   --   --   --   --   --   --   --   ALBUMIN  --   --   --  2.6*  --   --   --   --   --   --   --   --   TROPONINI  --  <0.30  --   --   --  <0.30  --   --   --   --   --   --   PROCALCITON  --   --   --   --   --   --   --   --   --   --   --  <0.10  PHART 7.230*  --   < >  --   < >  --  7.271* 7.290*  --  7.312*  --   --   PCO2ART 71.0*  --   < >  --   < >  --  73.6* 71.1*  --  67.3*  --   --   PO2ART 185.0*  --   < >  --   < >  --  110.0* 93.7  --  99.3  --   --   < > = values in this interval not displayed.  Recent Labs Lab 07/28/12 0347 07/28/12 0732 07/28/12 1126 07/28/12 1615 07/28/12 1925  GLUCAP 100* 97 109* 104* 108*   CXR: Much improved aeration    ASSESSMENT / PLAN:  PULMONARY A: Acute hypoxic/hypercapnic respiratory failure, much improved Possibleaspiration pneumonia P:   -Cont supplemental O2. Wean to off as tolerated  CARDIOVASCULAR A: Hypotension, resolved Hx of HTN, Hyperlipidemia. P:   -Monitor  RENAL A: No issues. P:   monitor  GASTROINTESTINAL A:  Nutrition. P:   -Cont current diet  HEMATOLOGIC A:  Anemia with abrupt drop 6/23, no overt bleeding. No indication for PRBCs P:  -Recheck CBC intermittently  INFECTIOUS A: Suspected aspiraton pneumonia. P:   -Micro and abx as above  ENDOCRINE A:  Hx of hypothyroidism. P:   -Cont synthroid   NEUROLOGIC A:  S/p Lumbar  spine surgery. Severe Post op pain Hx of Bipolar, depression, migraine headaches. P:   -Post op mgmt per NS PT consult 6/23 Cont analgesics  Transfer to 4N. PCCM will sign off. Please call if we can be of further assistance. Discussed with Dr Gwinda Maine, MD ; [facility] [phone].  After 5:30 PM or weekends, call [phone]

## 2012-07-29 NOTE — Evaluation (Signed)
Physical Therapy Evaluation Patient Details Name: Lindsey Rowe MRN: [id number] DOB: February 12, 1945 Today's Date: 07/29/2012 Time: 4098-1191 PT Time Calculation (min): 20 min  PT Assessment / Plan / Recommendation Clinical Impression   Pt is a 67 yo female admit with lumbar pain with radiation to Rt leg, and had right L3-4, L4-5 laminotomy, decompression intra and extraforaminal of the L3, L4, and L5 nerve roots. Noted to have change in mental status, hypoxia, hypercapnia. Patient demonstrates deficits in functional mobility as indicated below. Unsure if patient will progress to independent levels prior to discharge. Currently would recommend ST SNF, however, patient desires to return home.  Will continue to see acutely as indicated with hopes for dc with HHPT, but consider SNF as patient resides alone.      PT Assessment  Patient needs continued PT services    Follow Up Recommendations  Other (comment) (Pt desires HHPT, may need SNF pending progress)       Barriers to Discharge Decreased caregiver support      Equipment Recommendations  None recommended by PT    Recommendations for Other Services     Frequency Min 5X/week    Precautions / Restrictions Precautions Precautions: Back Precaution Comments: educated patient on "BAT" precautions   Pertinent Vitals/Pain 8/10 pain reported      Mobility  Bed Mobility Bed Mobility: Not assessed Transfers Transfers: Sit to Stand;Stand to Sit Sit to Stand: 1: +2 Total assist;With armrests;From chair/3-in-1 Sit to Stand: Patient Percentage: 60% Stand to Sit: 1: +2 Total assist;With armrests;To chair/3-in-1 Stand to Sit: Patient Percentage: 60% Details for Transfer Assistance: Use of gait belt and chuck pad to elevate Ambulation/Gait Ambulation/Gait Assistance: Not tested (comment)    Exercises General Exercises - Lower Extremity Ankle Circles/Pumps: AROM;Both;10 reps Hip Flexion/Marching: AROM;Strengthening;Both;5 reps   PT  Diagnosis: Difficulty walking;Generalized weakness;Acute pain  PT Problem List: Decreased strength;Decreased range of motion;Decreased activity tolerance;Decreased balance;Decreased mobility;Pain PT Treatment Interventions: DME instruction;Gait training;Functional mobility training;Therapeutic activities;Therapeutic exercise;Balance training;Patient/family education   PT Goals Acute Rehab PT Goals PT Goal Formulation: With patient Time For Goal Achievement: 08/12/12 Potential to Achieve Goals: Fair Pt will go Supine/Side to Sit: with modified independence PT Goal: Supine/Side to Sit - Progress: Goal set today Pt will go Sit to Stand: with modified independence PT Goal: Sit to Stand - Progress: Goal set today Pt will Ambulate: with modified independence PT Goal: Ambulate - Progress: Goal set today  Visit Information  Last PT Received On: 07/29/12 Assistance Needed: +2    Subjective Data  Subjective: I don't know what I can do it hurts so much Patient Stated Goal: really want to go home   Prior Functioning  Home Living Lives With: Alone Available Help at Discharge: Friend(s);Available PRN/intermittently Type of Home: Apartment Home Access: Level entry Home Layout: One level Bathroom Shower/Tub: Engineer, manufacturing systems: Standard Home Adaptive Equipment: Shower chair with back;Straight cane;Tub transfer bench;Walker - rolling Prior Function Level of Independence: Independent with assistive device(s) (uses cane) Able to Take Stairs?: Yes Driving: Yes Vocation: Retired Musician: No difficulties Dominant Hand: Right    Cognition  Cognition Arousal/Alertness: Awake/alert Behavior During Therapy: Anxious Overall Cognitive Status: Within Functional Limits for tasks assessed    Extremity/Trunk Assessment Right Upper Extremity Assessment RUE ROM/Strength/Tone: [facility] for tasks assessed Left Upper Extremity Assessment LUE ROM/Strength/Tone: WFL for tasks  assessed Right Lower Extremity Assessment RLE ROM/Strength/Tone: Deficits;Unable to fully assess;Due to pain RLE ROM/Strength/Tone Deficits: weakness reported RLE Sensation: Deficits RLE Sensation Deficits: numbness and tingling  Left Lower Extremity Assessment LLE ROM/Strength/Tone: Unable to fully assess;Due to pain   Balance Balance Balance Assessed: Yes Dynamic Standing Balance Dynamic Standing - Balance Support: Bilateral upper extremity supported;During functional activity Dynamic Standing - Level of Assistance: 1: +2 Total assist Dynamic Standing - Balance Activities: Lateral lean/weight shifting;Forward lean/weight shifting Dynamic Standing - Comments: patient attempted to perform marching in place, patient reported extreme pain and could not continue to perform  End of Session PT - End of Session Equipment Utilized During Treatment: Gait belt Activity Tolerance: Patient limited by pain Patient left: in chair;with call bell/phone within reach Nurse Communication: Mobility status  GP      Fabio Asa 07/29/2012, 1:25 PM  Charlotte Crumb, PT DPT  [phone]

## 2012-07-29 NOTE — Progress Notes (Signed)
ANTIBIOTIC CONSULT NOTE - Follow-up  Pharmacy Consult for zosyn Indication: rule out pneumonia  Allergies  Allergen Reactions  . Cefadroxil Itching    Ends of hair itched   . Gabapentin Other (See Comments)    Right foot and leg swelled     Patient Measurements: Height: 5' 3.5" (161.3 cm) Weight: 276 lb 7.3 oz (125.4 kg) IBW/kg (Calculated) : 53.55  Vital Signs: Temp: 98 F (36.7 C) (06/24 0733) Temp src: Oral (06/24 0733) BP: 139/80 mmHg (06/24 0600) Pulse Rate: 85 (06/24 0600) Intake/Output from previous day: 06/23 0701 - 06/24 0700 In: 1282.5 [P.O.:400; I.V.:770; IV Piggyback:112.5] Out: 665 [Urine:665] Intake/Output from this shift:    Labs:  Recent Labs  07/26/12 1846 07/27/12 0525 07/28/12 0400 07/29/12 0500  WBC 18.5*  --  9.8 9.4  HGB 10.1*  --  8.3* 8.0*  PLT 205  --  175 205  CREATININE 0.74 0.71 0.72  --    Estimated Creatinine Clearance: 91.1 ml/min (by C-G formula based on Cr of 0.72). No results found for this basename: VANCOTROUGH, Leodis Binet, VANCORANDOM, GENTTROUGH, GENTPEAK, GENTRANDOM, TOBRATROUGH, TOBRAPEAK, TOBRARND, AMIKACINPEAK, AMIKACINTROU, AMIKACIN,  in the last 72 hours   Microbiology: Recent Results (from the past 720 hour(s))  SURGICAL PCR SCREEN     Status: None   Collection Time    07/21/12  3:45 PM      Result Value Range Status   MRSA, PCR NEGATIVE  NEGATIVE Final   Staphylococcus aureus NEGATIVE  NEGATIVE Final   Comment:            The Xpert SA Assay (FDA     approved for NASAL specimens     in patients over 54 years of age),     is one component of     a comprehensive surveillance     program.  Test performance has     been validated by [employer] for patients greater     than or equal to 46 year old.     It is not intended     to diagnose infection nor to     guide or monitor treatment.  CULTURE, BLOOD (ROUTINE X 2)     Status: None   Collection Time    07/26/12  6:40 PM      Result Value Range Status   Specimen Description BLOOD LEFT ARM   Final   Special Requests BOTTLES DRAWN AEROBIC ONLY 4CC   Final   Culture  Setup Time 07/27/2012 02:39   Final   Culture     Final   Value:        BLOOD CULTURE RECEIVED NO GROWTH TO DATE CULTURE WILL BE HELD FOR 5 DAYS BEFORE ISSUING A FINAL NEGATIVE REPORT   Report Status PENDING   Incomplete  CULTURE, BLOOD (ROUTINE X 2)     Status: None   Collection Time    07/26/12  6:50 PM      Result Value Range Status   Specimen Description BLOOD LEFT HAND   Final   Special Requests BOTTLES DRAWN AEROBIC ONLY 4CC   Final   Culture  Setup Time 07/27/2012 02:39   Final   Culture     Final   Value:        BLOOD CULTURE RECEIVED NO GROWTH TO DATE CULTURE WILL BE HELD FOR 5 DAYS BEFORE ISSUING A FINAL NEGATIVE REPORT   Report Status PENDING   Incomplete   Assessment: 67 yo female s/p  right L3-4, L4-5 laminotomy, decompression intra and extraforaminal of the L3, L4, and L5 nerve roots. Noted to have change in mental status, hypoxia, hypercapnia on 6/21. Zosyn was started to cover possible aspiration PNA.  CXR on 6/23 noted improving aeration/airspace disease with persisting RUL opacity. Pt is currently afebrile and WBC is WNL. Renal function remains adequate. Today is day# 5 of zosyn therapy.   6/18 vanc (MD 1g q12h)>6/21, resumed 6/22>>6/23 6/20 zosyn >>  6/21 Blood - NGTD  Plan:  - Continue zosyn 3.375g IV Q 8 (4 hr infusion) - F/u renal fxn, C&S, clinical status and clarify intended LOT  Lysle Pearl, PharmD, BCPS Pager # [phone] 07/29/2012 7:52 AM

## 2012-07-29 NOTE — Progress Notes (Signed)
Patient ID: Lindsey Rowe, female   DOB: 08/07/1945, 67 y.o.   MRN: [id number] Awake, no sob. No headache, no weakness. CCM to see her. Will start pt/ot

## 2012-07-29 NOTE — Evaluation (Signed)
Occupational Therapy Evaluation Patient Details Name: Lindsey Rowe MRN: [id number] DOB: Sep 22, 1945 Today's Date: 07/29/2012 Time: 4098-1191 OT Time Calculation (min): 19 min  OT Assessment / Plan / Recommendation Clinical Impression  This 68 yo female s/p Right L3-4, L4-5 laminotomy, decompression intra and extraforaminal of the L3, L4, and L5 nerve roots with post op Hypercapnia (transferred to ICU) presents to acute OT with problems below. Will benefit from acute OT with follow up HHOT.    OT Assessment  Patient needs continued OT Services    Follow Up Recommendations  Home health OT    Barriers to Discharge Decreased caregiver support    Equipment Recommendations  None recommended by OT       Frequency  Min 3X/week    Precautions / Restrictions Precautions Precautions: Back Precaution Comments: educated patient on "BAT" precautions   Pertinent Vitals/Pain 8/10 back pain; pre-medicated and repositioned    ADL  Eating/Feeding: Simulated;Independent Where Assessed - Eating/Feeding: Chair Grooming: Simulated;Set up Where Assessed - Grooming: Supported sitting Upper Body Bathing: Simulated;Minimal assistance Where Assessed - Upper Body Bathing: Supported sitting Lower Body Bathing: Simulated;+1 Total assistance Where Assessed - Lower Body Bathing: Supported sit to stand Upper Body Dressing: Simulated;Maximal assistance Where Assessed - Upper Body Dressing: Supported sitting Lower Body Dressing: Simulated;+1 Total assistance Where Assessed - Lower Body Dressing: Supported sit to Pharmacist, hospital: Chief of Staff: Patient Percentage: 60% Statistician Method: Sit to stand Toileting - Architect and Hygiene: Simulated;+1 Total assistance Where Assessed - Engineer, mining and Hygiene: Standing Equipment Used: Gait belt Transfers/Ambulation Related to ADLs: total A +2 (pt=60%) sit to stand and stand to sit    OT Diagnosis: Generalized weakness;Acute pain  OT Problem List: Decreased activity tolerance;Pain;Decreased knowledge of use of DME or AE;Decreased knowledge of precautions;Obesity OT Treatment Interventions: Self-care/ADL training;Balance training;Patient/family education;DME and/or AE instruction   OT Goals Acute Rehab OT Goals OT Goal Formulation: With patient Time For Goal Achievement: 08/05/12 Potential to Achieve Goals: Good ADL Goals Pt Will Perform Grooming: Independently;Unsupported;Standing at sink ADL Goal: Grooming - Progress: Goal set today Pt Will Perform Lower Body Bathing: with modified independence;Unsupported;Sit to stand from chair;Sit to stand from bed;with adaptive equipment ADL Goal: Lower Body Bathing - Progress: Goal set today Pt Will Perform Lower Body Dressing: with modified independence;Sit to stand from chair;Sit to stand from bed;Unsupported;with adaptive equipment ADL Goal: Lower Body Dressing - Progress: Goal set today Pt Will Transfer to Toilet: with modified independence;Ambulation;with DME;Regular height toilet;Comfort height toilet;3-in-1;Grab bars;Maintaining back safety precautions ADL Goal: Toilet Transfer - Progress: Goal set today Pt Will Perform Toileting - Clothing Manipulation: with modified independence;Standing (following back precautions) ADL Goal: Toileting - Clothing Manipulation - Progress: Goal set today Pt Will Perform Toileting - Hygiene: with modified independence;Sit to stand from 3-in-1/toilet (following back precautions) ADL Goal: Toileting - Hygiene - Progress: Goal set today Pt Will Perform Tub/Shower Transfer: with modified independence;Ambulation;with DME;Transfer tub bench ADL Goal: Tub/Shower Transfer - Progress: Goal set today Miscellaneous OT Goals Miscellaneous OT Goal #1: Pt will be Mod I in and OOB for BADLs. OT Goal: Miscellaneous Goal #1 - Progress: Goal set today Miscellaneous OT Goal #2: Pt will be able to state back  precautions OT Goal: Miscellaneous Goal #2 - Progress: Goal set today  Visit Information  Last OT Received On: 07/29/12 Assistance Needed: +2 PT/OT Co-Evaluation/Treatment: Yes    Subjective Data  Subjective: "I remember you from last time"   Prior Functioning  Home Living Lives With: Alone Available Help at Discharge: Friend(s);Available PRN/intermittently Type of Home: Apartment Home Access: Level entry Home Layout: One level Bathroom Shower/Tub: Engineer, manufacturing systems: Standard Home Adaptive Equipment: Shower chair with back;Straight cane;Tub transfer bench;Walker - rolling Prior Function Level of Independence: Independent with assistive device(s) (uses cane) Able to Take Stairs?: Yes Driving: Yes Vocation: Retired Musician: No difficulties Dominant Hand: Right         Vision/Perception Vision - History Patient Visual Report: No change from baseline   Cognition  Cognition Arousal/Alertness: Awake/alert Behavior During Therapy: Anxious Overall Cognitive Status: Within Functional Limits for tasks assessed    Extremity/Trunk Assessment Right Upper Extremity Assessment RUE ROM/Strength/Tone: [facility] for tasks assessed Left Upper Extremity Assessment LUE ROM/Strength/Tone: WFL for tasks assessed Right Lower Extremity Assessment RLE ROM/Strength/Tone: Deficits;Unable to fully assess;Due to pain RLE ROM/Strength/Tone Deficits: weakness reported RLE Sensation: Deficits RLE Sensation Deficits: numbness and tingling Left Lower Extremity Assessment LLE ROM/Strength/Tone: Unable to fully assess;Due to pain     Mobility Bed Mobility Bed Mobility: Not assessed Transfers Sit to Stand: 1: +2 Total assist;With armrests;From chair/3-in-1 Sit to Stand: Patient Percentage: 60% Stand to Sit: 1: +2 Total assist;With armrests;To chair/3-in-1 Stand to Sit: Patient Percentage: 60% Details for Transfer Assistance: Use of gait belt and chuck pad  to elevate     Exercise General Exercises - Lower Extremity Ankle Circles/Pumps: AROM;Both;10 reps Hip Flexion/Marching: AROM;Strengthening;Both;5 reps   Balance Balance Balance Assessed: Yes Dynamic Standing Balance Dynamic Standing - Balance Support: Bilateral upper extremity supported;During functional activity Dynamic Standing - Level of Assistance: 1: +2 Total assist Dynamic Standing - Balance Activities: Lateral lean/weight shifting;Forward lean/weight shifting Dynamic Standing - Comments: patient attempted to perform marching in place, patient reported extreme pain and could not continue to perform   End of Session OT - End of Session Equipment Utilized During Treatment: Gait belt Activity Tolerance: Patient limited by pain Patient left: in chair;with call bell/phone within reach       Evette Georges [phone] 07/29/2012, 2:17 PM

## 2012-07-29 NOTE — Care Management Note (Unsigned)
    Page 1 of 2   08/01/2012     4:09:08 PM   CARE MANAGEMENT NOTE 08/01/2012  Patient:  Lindsey Rowe, Lindsey Rowe   Account Number:  [id number]  Date Initiated:  07/25/2012  Documentation initiated by:  Elmer Bales  Subjective/Objective Assessment:   Pt admitted for lumbar fusion.     Action/Plan:   Will follow for discharge needs pending PT/OT evals.   Anticipated DC Date:  07/28/2012   Anticipated DC Plan:    In-house referral  Clinical Social Worker      DC Planning Services  CM consult      Choice offered to / List presented to:             Status of service:  In process, will continue to follow Medicare Important Message given?   (If response is "NO", the following Medicare IM given date fields will be blank) Date Medicare IM given:   Date Additional Medicare IM given:    Discharge Disposition:    Per UR Regulation:  Reviewed for med. necessity/level of care/duration of stay  If discussed at Long Length of Stay Meetings, dates discussed:    Comments:  ContactBedelia Person [phone]  08/01/12 1400 Elmer Bales RN, MSN CM-  Met with patient a second time to discuss options.  CM did not hear back from [facility] CM, of which patient is aware.  Pt states that she has decided that she definitely wants to go home with [facility].  Mary with [facility] was notified and accepted referral.  Voicemail was left with Dr Salena Saner office to ask if patient may be discharged home today with [facility] RN/PT/OT/SW.  Awaiting return call.  Patient is aware that CM is waiting for the official order for discharge and states that her friend will be able to pick her up later today if she is discharged.   08/01/12 1210  Elmer Bales RN, MSN CM-  Met with patient to discuss discharge planning.  Pt's benefits regarding SNF placement were checked by Gretta Cool LSCW.  Pt's insurance does cover SNF.  Pt was made aware that this was the case.  Pt is unsure of whether she wants SNF or  HH. CM discussed safety concerns and support at home.  Pt states that she has a neighbor that will be providing support and stay with her "as long as she needs it".  Pt states that she has a walker and shower seat at home already.  Pt states that she needs a 3N1.  Pt was contacted by a [facility] Case Manager during her hospitalization, but was unsure of what the conversation entailed.  CM placed voicemail to Jackey Loge [facility] RN Case manager for more information per patient request.  07-29-12 1pm Avie Arenas, RNBSN [phone] patient sitting up in chair - PT/OT got up.  Patient lives at home alone. Guy Franco in room who lives down the hall from patient.  States she is available to assist patient on discharge.  patient has walker.  CM will continue to follow for PT recommendations.

## 2012-07-30 MED ORDER — WHITE PETROLATUM GEL
Status: AC
  Filled 2012-07-30: qty 5

## 2012-07-30 MED ORDER — ZOLPIDEM TARTRATE 5 MG PO TABS
5.0000 mg | ORAL_TABLET | Freq: Every evening | ORAL | Status: DC | PRN
  Administered 2012-07-30 – 2012-08-03 (×5): 5 mg via ORAL
  Filled 2012-07-30 (×5): qty 1

## 2012-07-30 NOTE — Progress Notes (Addendum)
Physical Therapy Treatment Patient Details Name: Lindsey Rowe MRN: [id number] DOB: 1945/02/25 Today's Date: 07/30/2012 Time: 4098-1191 PT Time Calculation (min): 33 min  PT Assessment / Plan / Recommendation  PT Comments   Patient is s/p laminotomy surgery with acute hypoxic respiratory failure resulting in functional limitations due to deficits in balance, endurance and weakness. Patient will benefit from skilled PT to increase their independence and safety with mobility to allow discharge to home after a short term SNF stay with therapy.    Follow Up Recommendations  SNF;Supervision/Assistance - 24 hour (Pt wants to go home and if refused SNF, needs HHPT)                 Equipment Recommendations  None recommended by PT        Frequency Min 5X/week   Progress towards PT Goals Progress towards PT goals: Progressing toward goals  Plan Current plan remains appropriate    Precautions / Restrictions Precautions Precautions: Back;Fall Back brace placed on in sitting.  Corset Precaution Comments: educated patient on "BAT" precautions Restrictions Weight Bearing Restrictions: No   Pertinent Vitals/Pain VSS, Some pain in back    Mobility  Bed Mobility Bed Mobility: Rolling Right;Right Sidelying to Sit;Sitting - Scoot to [place] of Bed Rolling Right: 4: Min assist;With rail Right Sidelying to Sit: With rails;1: +2 Total assist;HOB elevated Right Sidelying to Sit: Patient Percentage: 60% Sitting - Scoot to Edge of Bed: 3: Mod assist Details for Bed Mobility Assistance: Pt needed cues for log roll and assist for technique. Transfers Transfers: Sit to Stand;Stand to Sit Sit to Stand: 1: +2 Total assist;With armrests;From chair/3-in-1 Sit to Stand: Patient Percentage: 60% Stand to Sit: 1: +2 Total assist;With armrests;To chair/3-in-1 Stand to Sit: Patient Percentage: 60% Details for Transfer Assistance: Use of gait belt and chuck pad to elevate.  Needed incr time and cues for  hand placement.  Pt had a lot of difficulty with sit to stand with pt trying to reach up for RW even though cued not to do so.  Takes incr time to perform sit to stand.   Ambulation/Gait Ambulation/Gait Assistance: 1: +2 Total assist Ambulation/Gait: Patient Percentage: 80% Ambulation Distance (Feet): 14 Feet (7 feet x2 - ambulated to bathroom and back) Assistive device: Rolling walker Ambulation/Gait Assistance Details: Pt widens BOS and takes slow steps.  Took incr time to get to bathroom.  Needed assist to steer RW.   Gait Pattern: Step-through pattern;Decreased stride length;Decreased hip/knee flexion - right;Decreased hip/knee flexion - left;Shuffle;Trunk flexed;Wide base of support Gait velocity: decreased Stairs: No Wheelchair Mobility Wheelchair Mobility: No    Exercises General Exercises - Lower Extremity Ankle Circles/Pumps: AROM;Both;10 reps Hip Flexion/Marching: AROM;Strengthening;Both;5 reps     PT Goals Acute Rehab PT Goals- see care plan Patient Stated Goal: go home  Visit Information  Last PT Received On: 07/30/12 Assistance Needed: +2    Subjective Data  Subjective: "I want to get up." Patient Stated Goal: go home   Cognition  Cognition Arousal/Alertness: Awake/alert Behavior During Therapy: WFL for tasks assessed/performed Overall Cognitive Status: Within Functional Limits for tasks assessed    Balance  Dynamic Standing Balance Dynamic Standing - Balance Support: Bilateral upper extremity supported;During functional activity Dynamic Standing - Level of Assistance: 1: +2 Total assist;Patient percentage (comment) (pt = 70%) Dynamic Standing - Balance Activities: Lateral lean/weight shifting;Forward lean/weight shifting Dynamic Standing - Comments: Needs steadying assist with challenges.    End of Session PT - End of Session Equipment Utilized During Treatment:  Gait belt;Oxygen Activity Tolerance: Patient limited by fatigue;Patient limited by pain;Other  (comment) (had pain in left ankle - applied ice) Patient left: in chair;with call bell/phone within reach Nurse Communication: Mobility status        INGOLD,Jarrin Staley 07/30/2012, 1:24 PM [facility] Acute Rehabilitation [phone] (pager)

## 2012-07-30 NOTE — Progress Notes (Signed)
Patient ID: Lindsey Rowe, female   DOB: 01-12-1946, 67 y.o.   MRN: [id number] Better, oob, less pain. No sob. Transfer as per CCM

## 2012-07-31 MED ORDER — FLEET ENEMA 7-19 GM/118ML RE ENEM
1.0000 | ENEMA | Freq: Every day | RECTAL | Status: DC | PRN
  Administered 2012-08-03: 1 via RECTAL
  Filled 2012-07-31: qty 1

## 2012-07-31 NOTE — Clinical Social Work Psychosocial (Signed)
    Clinical Social Work Department BRIEF PSYCHOSOCIAL ASSESSMENT 07/31/2012  Patient:  Lindsey Rowe, Lindsey Rowe     Account Number:  [id number]     Admit date:  07/23/2012  Clinical Social Worker:  Gretta Cool, Chiropodist CSW  Date/Time:  07/31/2012 03:00 PM  Referred by:  CSW  Date Referred:  07/31/2012 Referred for  SNF Placement   Other Referral:   Interview type:  Patient Other interview type:    PSYCHOSOCIAL DATA Living Status:  ALONE Admitted from facility:   Level of care:   Primary support name:  Lindsey Rowe Primary support relationship to patient:  FRIEND Degree of support available:    CURRENT CONCERNS  Other Concerns:    SOCIAL WORK ASSESSMENT / PLAN Lindsey Rowe plans to return home at discharge and has identified friends who will take turns assistng her in the home. We have not confirmed those resources. As a back up plan we will pursue SNF placement.   Assessment/plan status:  Psychosocial Support/Ongoing Assessment of Needs Other assessment/ plan:   Information/referral to community resources:   snf bed offers    PATIENT'S/FAMILY'S RESPONSE TO PLAN OF CARE: Lindsey Rowe feels strongly about returning home. She agreed to a SNF search as a back up plan.

## 2012-07-31 NOTE — Progress Notes (Signed)
Occupational Therapy Treatment Patient Details Name: NAOMA BOXELL MRN: [id number] DOB: 08-30-1945 Today's Date: 07/31/2012 Time: 0865-7846 OT Time Calculation (min): 32 min  OT Assessment / Plan / Recommendation  OT comments  Educated pt on AE available for LB ADLs. Pt requiring total A for hygiene after toileting. SpO2 fluctuated between low 80s-95% on RA during ambulation needing cues for pursed lip controlled breathing. Left patient on 2 liters after session as she was staying in the mid 80s on RA at rest. With O2 she was up to 94% sitting and resting. Still feel SNF is most appropriate for her as she has limited support at home. She is requring at least modA for transfers and minA for ambulation and lacks adequate activity tolerance she would need to manage at home.   Follow Up Recommendations  SNF    Barriers to Discharge       Equipment Recommendations  Other (comment) (tbd)    Recommendations for Other Services    Frequency Min 2X/week   Progress towards OT Goals Progress towards OT goals: Progressing toward goals  Plan Discharge plan needs to be updated    Precautions / Restrictions Precautions Precautions: Back;Fall Precaution Comments: pt able to state 3/3 back precautions Required Braces or Orthoses: Spinal Brace Spinal Brace: Lumbar corset;Applied in sitting position Restrictions Weight Bearing Restrictions: No   Pertinent Vitals/Pain Pain 4/10 in back. Repositioned. Nurse notified.     ADL  Toilet Transfer: Performed;Moderate assistance Toilet Transfer Equipment: Raised toilet seat with arms (or 3-in-1 over toilet) Toileting - Clothing Manipulation and Hygiene: +1 Total assistance Where Assessed - Toileting Clothing Manipulation and Hygiene: Standing Equipment Used: Gait belt;Back brace;Rolling walker;Long-handled sponge;Long-handled shoe horn;Reacher;Sock aid Transfers/Ambulation Related to ADLs: Min A for ambulation. Mod A for sit to stand and Min A for  stand to sit ADL Comments: Pt sitting on 3 in 1 when OT arrived. Pt requiring Total A for hygiene. Educated pt on AE available to assist with ADLs. Will practice with these at later session.    OT Diagnosis:    OT Problem List:   OT Treatment Interventions:     OT Goals(current goals can now be found in the care plan section) Acute Rehab OT Goals Patient Stated Goal: go home ADL Goals Additional ADL Goal #1: Pt will be Mod I in and OOB for BADLs Additional ADL Goal #2: pt will be able to verbalize and demonstrate 3/3 back precautions.  Visit Information  Last OT Received On: 07/31/12 Assistance Needed: +2 PT/OT Co-Evaluation/Treatment: Yes    Subjective Data      Prior Functioning       Cognition  Cognition Arousal/Alertness: Awake/alert Behavior During Therapy: WFL for tasks assessed/performed Overall Cognitive Status: Within Functional Limits for tasks assessed    Mobility  Bed Mobility Bed Mobility: Not assessed Transfers Transfers: Sit to Stand;Stand to Sit Sit to Stand: 3: Mod assist;With upper extremity assist;From chair/3-in-1 Stand to Sit: 4: Min assist;With upper extremity assist;To chair/3-in-1 Details for Transfer Assistance: cues for hand placement, initially needing 2 person assist with therapist steadying RW so that she could pull on it so as to elevate trunk and extend however the 2nd time we stood she needed modA    Exercises      Balance     End of Session OT - End of Session Equipment Utilized During Treatment: Gait belt;Rolling walker;Back brace Activity Tolerance: Patient tolerated treatment well Patient left: in chair;with call bell/phone within reach Nurse Communication: Mobility status;Other (comment) (O2  fluctuating and placed pt back on O2)  GO     Earlie Raveling OTR/L [phone] 07/31/2012, 1:19 PM

## 2012-07-31 NOTE — Progress Notes (Signed)
Patient ID: Lindsey Rowe, female   DOB: 1945-02-17, 67 y.o.   MRN: [id number] Stable, no sob. Decrease of pain. Waiting for CCM to let me know when is she safe to go home.

## 2012-07-31 NOTE — Progress Notes (Signed)
Physical Therapy Treatment Patient Details Name: Lindsey Rowe MRN: [id number] DOB: May 24, 1945 Today's Date: 07/31/2012 Time: 9147-8295 PT Time Calculation (min): 28 min  PT Assessment / Plan / Recommendation  PT Comments   Progressing toward goals however still limited by activity tolerance, weakness and pain. SpO2 fluctuated between low 80s-95% on RA during ambulation needing cues for pursed lip controlled breathing. Left patient on 2 liters after session as she was staying in the mid 80s on RA at rest. With O2 she was up to 94% sitting and resting. Still feel SNF is most appropriate for her as she has limited support at home. She is requring at least modA for transfers and minA for ambulation and lacks adequate activity tolerance she would need to manage at home. RN made aware of this recommendation.   Follow Up Recommendations  SNF     Does the patient have the potential to tolerate intense rehabilitation     Barriers to Discharge        Equipment Recommendations  None recommended by PT    Recommendations for Other Services    Frequency Min 5X/week   Progress towards PT Goals Progress towards PT goals: Progressing toward goals  Plan Current plan remains appropriate    Precautions / Restrictions Precautions Precautions: Back;Fall Precaution Comments: educated patient on "BAT" precautions Required Braces or Orthoses: Spinal Brace Spinal Brace: Lumbar corset;Applied in sitting position   Pertinent Vitals/Pain Reports moderate back pain, repositioned and applied her back brace for comfort    Mobility  Bed Mobility Bed Mobility: Not assessed Transfers Transfers: Sit to Stand;Stand to Sit Sit to Stand: From chair/3-in-1;With upper extremity assist;3: Mod assist Stand to Sit: 4: Min assist;With upper extremity assist Details for Transfer Assistance: cues for hand placement, initially needing 2 person assist with therapist steadying RW so that she could pull on it so as to  elevate trunk and extend however the 2nd time we stood she needed modA Ambulation/Gait Ambulation/Gait Assistance: 4: Min assist Ambulation Distance (Feet): 40 Feet Assistive device: Rolling walker Ambulation/Gait Assistance Details: cues for tall posture and safe positioning within RW, cues to decrease twisting especially whent turning Gait Pattern: Step-through pattern;Trunk flexed;Decreased stride length;Shuffle General Gait Details: decreased step height Stairs: No      PT Goals (current goals can now be found in the care plan section) Acute Rehab PT Goals Patient Stated Goal: go home PT Goal Formulation: With patient Time For Goal Achievement: 08/12/12 Potential to Achieve Goals: Good  Visit Information  Last PT Received On: 07/31/12 Assistance Needed: +2 PT/OT Co-Evaluation/Treatment: Yes    Subjective Data  Patient Stated Goal: go home   Cognition  Cognition Arousal/Alertness: Awake/alert Behavior During Therapy: WFL for tasks assessed/performed Overall Cognitive Status: Within Functional Limits for tasks assessed    Balance     End of Session PT - End of Session Equipment Utilized During Treatment: Gait belt Activity Tolerance: Patient tolerated treatment well Patient left: in chair;with call bell/phone within reach   GP     [facility] HELEN 07/31/2012, 12:13 PM

## 2012-07-31 NOTE — Clinical Social Work Placement (Signed)
    Clinical Social Work Department CLINICAL SOCIAL WORK PLACEMENT NOTE 07/31/2012  Patient:  BERDINE, RASMUSSON  Account Number:  [id number] Admit date:  07/23/2012  Clinical Social Worker:  Gretta Cool, Chiropodist CSW  Date/time:  07/31/2012 03:00 PM  Clinical Social Work is seeking post-discharge placement for this patient at the following level of care:   SKILLED NURSING   (*CSW will update this form in Epic as items are completed)   07/31/2012  Patient/family provided with [facility] Department of Clinical Social Work's list of facilities offering this level of care within the geographic area requested by the patient (or if unable, by the patient's family).  07/31/2012  Patient/family informed of their freedom to choose among providers that offer the needed level of care, that participate in Medicare, Medicaid or managed care program needed by the patient, have an available bed and are willing to accept the patient.  07/31/2012  Patient/family informed of [facility]' ownership interest in [facility], as well as of the fact that they are under no obligation to receive care at this facility.  PASARR submitted to EDS on  PASARR number received from EDS on   FL2 transmitted to all facilities in geographic area requested by pt/family on  07/31/2012 FL2 transmitted to all facilities within larger geographic area on   Patient informed that his/her managed care company has contracts with or will negotiate with  certain facilities, including the following:     Patient/family informed of bed offers received:   Patient chooses bed at  Physician recommends and patient chooses bed at    Patient to be transferred to  on   Patient to be transferred to facility by   The following physician request were entered in Epic:   Additional Comments:

## 2012-08-01 NOTE — Progress Notes (Signed)
Waiting on discharge orders. Will continue monitor. Burnett Corrente, RN

## 2012-08-01 NOTE — Progress Notes (Signed)
Occupational Therapy Treatment Patient Details Name: Lindsey Rowe MRN: [id number] DOB: 01-18-1946 Today's Date: 08/01/2012 Time: 4098-1191 OT Time Calculation (min): 46 min  OT Assessment / Plan / Recommendation  OT comments  Progressing towards goals. Pt requiring 2 people to help her stand when she doesn't have armrests. Pt requiring total A for toilet hygiene. Still insistent on going home with help from her friend Lindsey Rowe. Reports Lindsey Rowe can be here tomorrow to practice with transfers as she will likely d/c home tomorrow. Still agree with SNF. If she refuses she will need HHOT and 24 hour assist.    Follow Up Recommendations  SNF ; HHOT/ 24/7 supervision if pt refuses to go to SNF   Barriers to Discharge       Equipment Recommendations  Other (comment) (tbd)    Recommendations for Other Services    Frequency Min 2X/week   Progress towards OT Goals Progress towards OT goals: Progressing toward goals  Plan Discharge plan remains appropriate    Precautions / Restrictions Precautions Precautions: Back;Fall Precaution Comments: pt able to state 3/3 back precautions with cues Required Braces or Orthoses: Spinal Brace Spinal Brace: Lumbar corset;Applied in sitting position Restrictions Weight Bearing Restrictions: No   Pertinent Vitals/Pain Reports 3/10 back pain. Repositioned. O2 at end of session on RA was 87% with nurse in room, but trended up quickly to 90's.     ADL  Lower Body Dressing: Performed;Minimal assistance Where Assessed - Lower Body Dressing: Supported sitting;Unsupported sitting Toilet Transfer: Minimal assistance Toilet Transfer Method: Sit to stand Toilet Transfer Equipment: Raised toilet seat with arms (or 3-in-1 over toilet) Toileting - Clothing Manipulation and Hygiene: +1 Total assistance Where Assessed - Toileting Clothing Manipulation and Hygiene: Sit to stand from 3-in-1 or toilet Equipment Used: Gait belt;Back brace;Long-handled shoe  horn;Long-handled sponge;Reacher;Sock aid;Rolling walker Transfers/Ambulation Related to ADLs: Minguard for ambulation and +2 total assist (70%) from bed. Pt requiring less assistance from 3 in 1 and recliner chair. ADL Comments: Pt practiced donning/doffing socks with reacher and sockaid at Min A level. OT educated on how to use reacher to don underwear. Pt requiring total A for hygiene. OT educated on toilet aid and highly recommending getting this to assist with hygiene.    OT Diagnosis:    OT Problem List:   OT Treatment Interventions:     OT Goals(current goals can now be found in the care plan section) Acute Rehab OT Goals Patient Stated Goal: go home  Visit Information  Last OT Received On: 08/01/12 Assistance Needed: +2 PT/OT Co-Evaluation/Treatment: Yes    Subjective Data      Prior Functioning       Cognition  Cognition Arousal/Alertness: Awake/alert Behavior During Therapy: WFL for tasks assessed/performed Overall Cognitive Status: Within Functional Limits for tasks assessed    Mobility  Bed Mobility Bed Mobility: Rolling Right;Right Sidelying to Sit;Sit to Sidelying Left Rolling Right: 5: Supervision Right Sidelying to Sit: 5: Supervision Sitting - Scoot to Edge of Bed: 5: Supervision Sit to Sidelying Left: 3: Mod assist Details for Bed Mobility Assistance: cues for back precautions. Mod A to lift legs onto bed Transfers Transfers: Sit to Stand;Stand to Sit Sit to Stand: 1: +2 Total assist;From bed;From chair/3-in-1;4: Min assist (Min A for sit to stand from 3 in 1 ) Sit to Stand: Patient Percentage: 70% Stand to Sit: 4: Min guard;With upper extremity assist;To chair/3-in-1 Details for Transfer Assistance: cues for hand placement, attempted sit->stand from bed with at least one hand on the bed  however patient unable needing therapist to stabilize RW as she pulls herself up as well as faciliation from 2 person assist for lift off; stood again x3 for practice with  cues for hand placement standing from chair and toilet, improved independence when she has armrests needing only mingaurdA     Exercises      Balance     End of Session OT - End of Session Equipment Utilized During Treatment: Gait belt;Rolling walker;Back brace Activity Tolerance: Patient tolerated treatment well Patient left: in bed;with call bell/phone within reach;with nursing/sitter in room Nurse Communication: Other (comment) (O2 sats)  GO     Earlie Raveling OTR/L [phone] 08/01/2012, 10:15 AM

## 2012-08-01 NOTE — Progress Notes (Signed)
No headache, no fever. No leg pain. Breathing normally. Still requiring two persons to help her out ob be. She told me today that her insurance do not of for her to go to a nursing home and that she has help at home. Clinically she can be discharge. We will find out about  her insurance policy.

## 2012-08-01 NOTE — Progress Notes (Addendum)
Physical Therapy Treatment Patient Details Name: Lindsey Rowe MRN: [id number] DOB: 1945-04-19 Today's Date: 08/01/2012 Time: 4098-1191 PT Time Calculation (min): 38 min  PT Assessment / Plan / Recommendation  PT Comments   Progressing towards goals. Ambulating well although had one moderate LOB initially needing assist to correct. Transfers remain difficult. When she doesn't have arm rests she requires 2 people to help her stand. Still insistent on going home with help from her friend Junious Dresser. Reports Junious Dresser can be here tomorrow to practice with transfers as she will likely d/c home tomorrow. Still agree with SNF. If she refuses she will need HHPT and 24 hour assist.   Follow Up Recommendations  SNF (if patient refuses she will need HHPT)     Does the patient have the potential to tolerate intense rehabilitation     Barriers to Discharge        Equipment Recommendations       Recommendations for Other Services    Frequency Min 5X/week   Progress towards PT Goals Progress towards PT goals: Progressing toward goals  Plan Current plan remains appropriate    Precautions / Restrictions Precautions Precautions: Back;Fall Precaution Comments: pt able to state 3/3 back precautions Required Braces or Orthoses: Spinal Brace Spinal Brace: Lumbar corset;Applied in sitting position Restrictions Weight Bearing Restrictions: No   Pertinent Vitals/Pain Reports 3/10 back pain   Mobility  Bed Mobility Rolling Right: 5: Supervision Sitting - Scoot to Edge of Bed: 5: Supervision Details for Bed Mobility Assistance: cues for back precautions Transfers Transfers: Sit to Stand;Stand to Sit Sit to Stand: 1: +2 Total assist;From bed;From chair/3-in-1 Sit to Stand: Patient Percentage: 70% Stand to Sit: 4: Min guard;With upper extremity assist;To chair/3-in-1 Details for Transfer Assistance: cues for hand placement, attempted sit->stand from bed with at least one hand on the bed however patient  unable needing therapist to stabilize RW as she pulls herself up as well as faciliation from 2 person assist for lift off; stood again x3 for practice with cues for hand placement standing from chair and toilet, improved independence when she has armrests needing only mingaurdA  Ambulation/Gait Ambulation/Gait Assistance: 4: Min guard Ambulation Distance (Feet): 80 Feet Assistive device: Rolling walker Ambulation/Gait Assistance Details: cues for tall posture and relaxed shoulders and safe proxmity to RW Gait Pattern: Step-through pattern;Trunk flexed;Shuffle General Gait Details: decreased step height      PT Goals (current goals can now be found in the care plan section) Acute Rehab PT Goals Patient Stated Goal: go home  Visit Information  Last PT Received On: 08/01/12 Assistance Needed:  (+2 for sit<>stand, +1 otherwise) PT/OT Co-Evaluation/Treatment: Yes    Subjective Data  Patient Stated Goal: go home   Cognition  Cognition Arousal/Alertness: Awake/alert Behavior During Therapy: WFL for tasks assessed/performed Overall Cognitive Status: Within Functional Limits for tasks assessed    Balance     End of Session PT - End of Session Equipment Utilized During Treatment: Gait belt Activity Tolerance: Patient tolerated treatment well Patient left: in chair;with call bell/phone within reach Nurse Communication: Mobility status   GP     [facility] HELEN 08/01/2012, 9:41 AM

## 2012-08-01 NOTE — Progress Notes (Signed)
Patient ID: Lindsey Rowe, female   DOB: [birth year], 67 y.o.   MRN: [id number] Stable. Waiting for input about where to discharge [name]

## 2012-08-02 LAB — CULTURE, BLOOD (ROUTINE X 2)
Culture: NO GROWTH
Culture: NO GROWTH

## 2012-08-02 MED ORDER — DOCUSATE SODIUM 100 MG PO CAPS
100.0000 mg | ORAL_CAPSULE | Freq: Two times a day (BID) | ORAL | Status: DC
  Administered 2012-08-02 – 2012-08-04 (×5): 100 mg via ORAL
  Filled 2012-08-02 (×5): qty 1

## 2012-08-02 MED ORDER — MAGNESIUM HYDROXIDE 400 MG/5ML PO SUSP
15.0000 mL | Freq: Every day | ORAL | Status: DC | PRN
Start: 2012-08-02 — End: 2012-08-04

## 2012-08-02 NOTE — Progress Notes (Signed)
Occupational Therapy Treatment Patient Details Name: FOYE HAGGART MRN: [id number] DOB: [birth year] Today's Date: 08/02/2012 Time: 1191-4782 OT Time Calculation (min): 38 min  OT Assessment / Plan / Recommendation  OT comments  Pt fatigued this pm with limited ability to participate.  She is now agreeable to SNF as she does not feel she can manage at home with her friend assisting her. RN notified.  Pt also requesting a new back brace - encouraged her to speak to MD  Follow Up Recommendations  SNF    Barriers to Discharge       Equipment Recommendations  None recommended by OT    Recommendations for Other Services    Frequency Min 2X/week   Progress towards OT Goals Progress towards OT goals: Progressing toward goals;Goals updated - see care plan  Plan Discharge plan needs to be updated    Precautions / Restrictions Precautions Precautions: Back;Fall Precaution Comments: pt able to state 2/3 back precautions with cues Required Braces or Orthoses: Spinal Brace Spinal Brace: Lumbar corset;Applied in sitting position       ADL  Lower Body Dressing: Maximal assistance (due to fatige) Where Assessed - Lower Body Dressing: Supported sit to Pharmacist, hospital: Minimal assistance Toilet Transfer Method: Sit to stand;Stand pivot Acupuncturist: Bedside commode Toileting - Clothing Manipulation and Hygiene: Maximal assistance Equipment Used: Reacher;Rolling walker;Back brace Transfers/Ambulation Related to ADLs: Min A for transfer to [facility] ADL Comments: Pt doffed socks with reacher - required increased time.  Pt with urgent need to use commode.  Pt transferred to [facility]. Total A for hygiene, and mod A for clothing.  Pt very fatigued after returning to bed.  Required max A to don corsett.  Long discussion  with pt re: need for 24 hour assistance and that I do not feel she can manage at home with planned level of assistance.  Pt now states she is agreeable to SNF and feels that  is her best option    OT Diagnosis:    OT Problem List:   OT Treatment Interventions:     OT Goals(current goals can now be found in the care plan section) ADL Goals Additional ADL Goal #1: Pt will be Mod I in and OOB for BADLs Additional ADL Goal #2: pt will be able to verbalize and demonstrate 3/3 back precautions. Miscellaneous OT Goals Miscellaneous OT Goal #1: Pt will be Mod I in and OOB for BADLs. Miscellaneous OT Goal #2: Pt will be able to state back precautions  Visit Information  Last OT Received On: 08/02/12 Assistance Needed: +1    Subjective Data      Prior Functioning       Cognition  Cognition Arousal/Alertness: Awake/alert Behavior During Therapy: Anxious Overall Cognitive Status: Within Functional Limits for tasks assessed Area of Impairment: Safety/judgement Safety/Judgement: Decreased awareness of safety General Comments: Basline bipolar per Dr. Jeral Fruit, patient changes her mind a lot, limited insight into how difficult it would be for her alone    Mobility  Bed Mobility Bed Mobility: Sit to Sidelying Left;Scooting to [facility];Rolling Left;Left Sidelying to Sit;Sitting - Scoot to Edge of Bed Rolling Right: 7: Independent Rolling Left: 5: Supervision;With rail Left Sidelying to Sit: 4: Min guard;With rails Sitting - Scoot to Edge of Bed: 4: Min guard Sit to Sidelying Left: 3: Mod assist;With rail;HOB flat (pt fatigued) Scooting to HOB: 4: Min guard;With rail (with bed in trendelenberg) Details for Bed Mobility Assistance: cues for back precautions.  Pt fatigued requiring increased assistance  today Transfers Transfers: Sit to Stand;Stand to Sit Sit to Stand: 4: Min assist;With upper extremity assist;From bed;From chair/3-in-1 Stand to Sit: 4: Min assist;With upper extremity assist;To bed;To chair/3-in-1 Details for Transfer Assistance: assist for balance and safety.  Pt with urgent need to use BSC.  Pt. very fatigued today    Exercises      Balance      End of Session OT - End of Session Equipment Utilized During Treatment: Rolling walker;Back brace Activity Tolerance: Patient limited by fatigue Patient left: in bed;with call bell/phone within reach;with bed alarm set Nurse Communication: Other (comment) (Pt now agreeable to SNF)  GO     Takeyla Million, Ursula Alert M 08/02/2012, 3:55 PM

## 2012-08-02 NOTE — Progress Notes (Signed)
Patient is passing a lot of gas today but still no bm.  Patient was given dulcolax suppository and a colace.  I offered prune juice and then later, an enema.  She refused both of these and stated that she would consider the enema tonight.  Will pass on to the next RN.  Lance Bosch, RN

## 2012-08-02 NOTE — Progress Notes (Signed)
Patient has not had a bowel movement since 07/23/12.  She states that she has "asked and asked" for someone to get her something to help her go and that nobody has.  I will give her an enema today and will monitor closely. Lance Bosch, RN

## 2012-08-02 NOTE — Progress Notes (Signed)
Physical Therapy Treatment Patient Details Name: Lindsey Rowe MRN: [id number] DOB: May 21, 1945 Today's Date: 08/02/2012 Time: 4098-1191 PT Time Calculation (min): 32 min  PT Assessment / Plan / Recommendation  PT Comments   Steady progress today. Transferring better. Patient initially annoyed with me because she thinks I am the reason the doctor won't let her go home today. I assured her that I am only concerned for her safety as Junious Dresser now as gout and won't physically be able to assist her until this has cleared. The patient did suggest she might be open to SNF today but that she has never seen a Child psychotherapist. I again assured her that this isn't the case, that social work has been in to see her and has given her bed offers to choose from. She then reported that she was worried about her insurance covering SNF. I relayed her concerns to the nurse and asked if we can have a Child psychotherapist come discuss payment. Unsure if patient will actually agree to SNF however I still believe this is the best plan. Again, if she declines SNF she will need HH services (PT, OT, SW).   Follow Up Recommendations  SNF (if she declines she will need HH PT)     Does the patient have the potential to tolerate intense rehabilitation     Barriers to Discharge        Equipment Recommendations       Recommendations for Other Services    Frequency Min 5X/week   Progress towards PT Goals Progress towards PT goals: Progressing toward goals  Plan Current plan remains appropriate    Precautions / Restrictions Precautions Precautions: Back;Fall Precaution Comments: pt able to state 3/3 back precautions with cues Required Braces or Orthoses: Spinal Brace Spinal Brace: Lumbar corset;Applied in sitting position   Pertinent Vitals/Pain Reports moderate back pain; assisted to reposition her and ambulate to relieve pain    Mobility  Bed Mobility Bed Mobility: Sit to Sidelying Left;Rolling Right;Scooting to  [facility] Rolling Right: 7: Independent Sitting - Scoot to Edge of Bed: 4: Min guard Sit to Sidelying Left: 5: Supervision;HOB elevated (20 degrees) Scooting to HOB: 4: Min guard;With rail (with bed in trendelenberg) Details for Bed Mobility Assistance: cues for back precautions Transfers Transfers: Sit to Stand;Stand to Sit Sit to Stand: 4: Min guard;From chair/3-in-1;From bed;From toilet Stand to Sit: 4: Min guard;To chair/3-in-1;To toilet;To bed Details for Transfer Assistance: stood from various surfaces today with mingaurdA, increased effort and difficulty with hand placement, tends to bend too far forward but cannot get her trunk over her feet without bending; stands with much better mechanics from a taller surface; cues for safe hand placement and gaurding for technique Ambulation/Gait Ambulation/Gait Assistance: 4: Min guard Ambulation Distance (Feet): 150 Feet Assistive device: Rolling walker Ambulation/Gait Assistance Details: cues for tall posture, safe proximity to RW and relaxed shoulders Gait Pattern: Step-through pattern;Decreased stride length      PT Goals (current goals can now be found in the care plan section)    Visit Information  Last PT Received On: 08/02/12 Assistance Needed: +1    Subjective Data      Cognition  Cognition Arousal/Alertness: Awake/alert Behavior During Therapy: Anxious Overall Cognitive Status: History of cognitive impairments - at baseline Area of Impairment: Safety/judgement Safety/Judgement: Decreased awareness of safety General Comments: Basline bipolar per 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

## 2012-08-02 NOTE — Progress Notes (Signed)
Doing well. C/o appropriate incisional soreness. No leg  pain   Temp:  [97.6 F (36.4 C)-98.5 F (36.9 C)] 97.6 F (36.4 C) (06/28 0600) Pulse Rate:  [81-94] 85 (06/28 0600) Resp:  [20] 20 (06/28 0600) BP: (135-151)/(65-87) 143/71 mmHg (06/28 0600) SpO2:  [96 %-98 %] 96 % (06/28 0600)  Incision CDI  Plan: CMP, mobilize -  Plan -D/C monday

## 2012-08-03 NOTE — Progress Notes (Signed)
Patient ID: Lindsey Rowe, female   DOB: [birth year], 67 y.o.   MRN: [id number] Afeb. vss Feeling much better. Feels that if she feels this good tomorrow she would be ready to go directly home and not need a snf. Will defer that to Dr Jeral Fruit tomorrow.

## 2012-08-04 NOTE — Progress Notes (Signed)
   CARE MANAGEMENT NOTE 08/04/2012  Patient:  Lindsey Rowe, Lindsey Rowe   Account Number:  [id number]  Date Initiated:  07/25/2012  Documentation initiated by:  Lindsey Rowe  Subjective/Objective Assessment:   Pt admitted for lumbar fusion.     Action/Plan:   Will follow for discharge needs pending PT/OT evals.   Anticipated DC Date:  07/28/2012   Anticipated DC Plan:    In-house referral  Clinical Social Worker      DC Planning Services  CM consult      Choice offered to / List presented to:  C-1 Patient   DME arranged  3-N-1      DME agency  [facility]     HH arranged  HH-2 PT  HH-3 OT      [facility] agency  [facility]   Status of service:  Completed, signed off Medicare Important Message given?   (If response is "NO", the following Medicare IM given date fields will be blank) Date Medicare IM given:   Date Additional Medicare IM given:    Discharge Disposition:  HOME W HOME HEALTH SERVICES  Per UR Regulation:  Reviewed for med. necessity/level of care/duration of stay  If discussed at Long Length of Stay Meetings, dates discussed:    Comments:  ContactBedelia Person [phone]  08/04/12 1030 Lindsey Bales RN, MSN, CM-Voicemail left for Lindsey Rowe with [facility] to notify that patient will be discharged home today.  08/01/12 1400 Lindsey Bales RN, MSN CM-  Met with patient a second time to discuss options.  CM did not hear back from [facility] CM, of which patient is aware.  Pt states that she has decided that she definitely wants to go home with [facility].  Lindsey Rowe with [facility] was notified and accepted referral.  Voicemail was left with Dr Salena Saner office to ask if patient may be discharged home today with [facility] RN/PT/OT/SW.  Awaiting return call.  Patient is aware that CM is waiting for the official order for discharge and states that her friend will be able to pick her up later today if she is discharged.   08/01/12 1210  Lindsey Bales RN,  MSN CM-  Met with patient to discuss discharge planning.  Pt's benefits regarding SNF placement were checked by Lindsey Rowe LSCW.  Pt's insurance does cover SNF.  Pt was made aware that this was the case.  Pt is unsure of whether she wants SNF or HH. CM discussed safety concerns and support at home.  Pt states that she has a neighbor that will be providing support and stay with her "as long as she needs it".  Pt states that she has a walker and shower seat at home already.  Pt states that she needs a 3N1.  Pt was contacted by a [facility] Case Manager during her hospitalization, but was unsure of what the conversation entailed.  CM placed voicemail to Lindsey Rowe [facility] RN Case manager for more information per patient request.  07-29-12 1pm Lindsey Rowe, RNBSN [phone] patient sitting up in chair - PT/OT got up.  Patient lives at home alone. Guy Franco in room who lives down the hall from patient.  States she is available to assist patient on discharge.  patient has walker.  CM will continue to follow for PT recommendations.

## 2012-08-04 NOTE — Progress Notes (Signed)
Patient discharge paperwork and education complete.  Dr. Jeral Fruit gave prescriptions to patient already.  She is getting dressed and is awaiting her ride.  She has no IV currently to remove.  Will continue to monitor.  Lance Bosch, RN

## 2012-08-04 NOTE — Discharge Summary (Signed)
Physician Discharge Summary  Patient ID: Lindsey Rowe MRN: [id number] DOB/AGE: Mar 02, 1945 67 y.o.  Admit date: 07/23/2012 Discharge date: 08/04/2012  Admission Diagnoses:lumbar foraminal stenosis  Discharge Diagnoses: same plus pneumonia. duratotomy Active Problems:   Acute respiratory failure   Aspiration pneumonia   Chest pain   Discharged Condition: stable, no fever  Hospital Course: lumbar foraminotomies. icu   Consults:CCM  Significant Diagnostic Studies: MYELOGRAM  Treatments: surgery  Discharge Exam:ambulating, no headache. No weakness  Disposition: 01-Home or Self Care   Future Appointments Provider Department Dept Phone   11/19/2012 12:45 PM Sherrie George, MD [facility] [phone]       Medication List    ASK your doctor about these medications       Aspirin-Acetaminophen-Caffeine 260-130-16 MG Tabs  Take 1 packet by mouth daily as needed. For headaches     DULoxetine 60 MG capsule  Commonly known as:  CYMBALTA  Take 60 mg by mouth daily.     HYDROcodone-acetaminophen 5-500 MG per tablet  Commonly known as:  VICODIN  Take 1 tablet by mouth every 6 (six) hours as needed. For back pain     levothyroxine 50 MCG tablet  Commonly known as:  SYNTHROID, LEVOTHROID  Take 50 mcg by mouth daily before breakfast.     lisinopril 10 MG tablet  Commonly known as:  PRINIVIL,ZESTRIL  Take 20 mg by mouth daily.     omeprazole 10 MG capsule  Commonly known as:  PRILOSEC  Take 10 mg by mouth 2 (two) times daily.     pravastatin 10 MG tablet  Commonly known as:  PRAVACHOL  Take 20 mg by mouth at bedtime.     SUMAtriptan 25 MG tablet  Commonly known as:  IMITREX  Take 25 mg by mouth every 2 (two) hours as needed for migraine.     traZODone 100 MG tablet  Commonly known as:  DESYREL  Take 100-300 mg by mouth at bedtime as needed and may repeat dose one time if needed for sleep.         Signed: Karn Cassis 08/04/2012, 9:31 AM

## 2012-08-13 ENCOUNTER — Encounter (HOSPITAL_COMMUNITY): Payer: Self-pay | Admitting: Emergency Medicine

## 2012-08-13 ENCOUNTER — Observation Stay (HOSPITAL_COMMUNITY)
Admission: EM | Admit: 2012-08-13 | Discharge: 2012-08-15 | Disposition: A | Discharge status: Home / Self Care | Payer: Medicare Other | Attending: Internal Medicine | Admitting: Internal Medicine

## 2012-08-13 ENCOUNTER — Emergency Department (HOSPITAL_COMMUNITY): Payer: Medicare Other

## 2012-08-13 DIAGNOSIS — N179 Acute kidney failure, unspecified: Secondary | ICD-10-CM | POA: Diagnosis present

## 2012-08-13 DIAGNOSIS — I959 Hypotension, unspecified: Principal | ICD-10-CM | POA: Diagnosis present

## 2012-08-13 DIAGNOSIS — F319 Bipolar disorder, unspecified: Secondary | ICD-10-CM | POA: Insufficient documentation

## 2012-08-13 DIAGNOSIS — N289 Disorder of kidney and ureter, unspecified: Secondary | ICD-10-CM

## 2012-08-13 DIAGNOSIS — E785 Hyperlipidemia, unspecified: Secondary | ICD-10-CM | POA: Insufficient documentation

## 2012-08-13 DIAGNOSIS — E662 Morbid (severe) obesity with alveolar hypoventilation: Secondary | ICD-10-CM | POA: Diagnosis present

## 2012-08-13 DIAGNOSIS — E860 Dehydration: Secondary | ICD-10-CM | POA: Diagnosis present

## 2012-08-13 LAB — URINALYSIS, ROUTINE W REFLEX MICROSCOPIC
Glucose, UA: NEGATIVE mg/dL
Hgb urine dipstick: NEGATIVE
Ketones, ur: NEGATIVE mg/dL
Leukocytes, UA: NEGATIVE
Nitrite: NEGATIVE
Protein, ur: NEGATIVE mg/dL
Specific Gravity, Urine: 1.014 (ref 1.005–1.030)
Urobilinogen, UA: 1 mg/dL (ref 0.0–1.0)
pH: 5 (ref 5.0–8.0)

## 2012-08-13 LAB — COMPREHENSIVE METABOLIC PANEL
ALT: 10 U/L (ref 0–35)
AST: 20 U/L (ref 0–37)
Albumin: 2.7 g/dL — ABNORMAL LOW (ref 3.5–5.2)
Alkaline Phosphatase: 63 U/L (ref 39–117)
BUN: 14 mg/dL (ref 6–23)
CO2: 33 mEq/L — ABNORMAL HIGH (ref 19–32)
Calcium: 8.5 mg/dL (ref 8.4–10.5)
Chloride: 96 mEq/L (ref 96–112)
Creatinine, Ser: 1.9 mg/dL — ABNORMAL HIGH (ref 0.50–1.10)
GFR calc Af Amer: 31 mL/min — ABNORMAL LOW (ref >90)
GFR calc non Af Amer: 27 mL/min — ABNORMAL LOW (ref >90)
Glucose, Bld: 92 mg/dL (ref 70–99)
Potassium: 4.3 mEq/L (ref 3.5–5.1)
Sodium: 135 mEq/L (ref 135–145)
Total Bilirubin: 0.4 mg/dL (ref 0.3–1.2)
Total Protein: 5.4 g/dL — ABNORMAL LOW (ref 6.0–8.3)

## 2012-08-13 LAB — CBC WITH DIFFERENTIAL/PLATELET
Basophils Absolute: 0 10*3/uL (ref 0.0–0.1)
Basophils Relative: 0 % (ref 0–1)
Eosinophils Absolute: 0.1 10*3/uL (ref 0.0–0.7)
Eosinophils Relative: 1 % (ref 0–5)
HCT: 29.9 % — ABNORMAL LOW (ref 36.0–46.0)
Hemoglobin: 9.3 g/dL — ABNORMAL LOW (ref 12.0–15.0)
Lymphocytes Relative: 27 % (ref 12–46)
Lymphs Abs: 2.7 10*3/uL (ref 0.7–4.0)
MCH: 30.7 pg (ref 26.0–34.0)
MCHC: 31.1 g/dL (ref 30.0–36.0)
MCV: 98.7 fL (ref 78.0–100.0)
Monocytes Absolute: 0.9 10*3/uL (ref 0.1–1.0)
Monocytes Relative: 9 % (ref 3–12)
Neutro Abs: 6.2 10*3/uL (ref 1.7–7.7)
Neutrophils Relative %: 63 % (ref 43–77)
Platelets: 365 10*3/uL (ref 150–400)
RBC: 3.03 MIL/uL — ABNORMAL LOW (ref 3.87–5.11)
RDW: 14.2 % (ref 11.5–15.5)
WBC: 9.9 10*3/uL (ref 4.0–10.5)

## 2012-08-13 LAB — CG4 I-STAT (LACTIC ACID)
Lactic Acid, Venous: 1.84 mmol/L (ref 0.5–2.2)
Lactic Acid, Venous: 1.54 mmol/L (ref 0.5–2.2)

## 2012-08-13 LAB — PROCALCITONIN: Procalcitonin: 0.11 ng/mL

## 2012-08-13 MED ORDER — SODIUM CHLORIDE 0.9 % IV SOLN
1000.0000 mL | Freq: Once | INTRAVENOUS | Status: AC
  Administered 2012-08-13: 1000 mL via INTRAVENOUS

## 2012-08-13 MED ORDER — LEVOTHYROXINE SODIUM 50 MCG PO TABS
50.0000 ug | ORAL_TABLET | Freq: Every day | ORAL | Status: DC
  Administered 2012-08-14 – 2012-08-15 (×2): 50 ug via ORAL
  Filled 2012-08-13 (×3): qty 1

## 2012-08-13 MED ORDER — PANTOPRAZOLE SODIUM 40 MG PO TBEC
40.0000 mg | DELAYED_RELEASE_TABLET | Freq: Every day | ORAL | Status: DC
  Administered 2012-08-13 – 2012-08-15 (×3): 40 mg via ORAL
  Filled 2012-08-13 (×2): qty 1

## 2012-08-13 MED ORDER — SODIUM CHLORIDE 0.9 % IJ SOLN
3.0000 mL | Freq: Two times a day (BID) | INTRAMUSCULAR | Status: DC
  Administered 2012-08-13 – 2012-08-14 (×2): 3 mL via INTRAVENOUS

## 2012-08-13 MED ORDER — HEPARIN SODIUM (PORCINE) 5000 UNIT/ML IJ SOLN
5000.0000 [IU] | Freq: Three times a day (TID) | INTRAMUSCULAR | Status: DC
  Administered 2012-08-13 – 2012-08-15 (×4): 5000 [IU] via SUBCUTANEOUS
  Filled 2012-08-13 (×8): qty 1

## 2012-08-13 MED ORDER — SIMVASTATIN 10 MG PO TABS
10.0000 mg | ORAL_TABLET | Freq: Every day | ORAL | Status: DC
  Administered 2012-08-14: 10 mg via ORAL
  Filled 2012-08-13 (×2): qty 1

## 2012-08-13 MED ORDER — TRAZODONE HCL 50 MG PO TABS
100.0000 mg | ORAL_TABLET | Freq: Every evening | ORAL | Status: DC | PRN
  Filled 2012-08-13: qty 6

## 2012-08-13 MED ORDER — DULOXETINE HCL 60 MG PO CPEP
60.0000 mg | ORAL_CAPSULE | Freq: Every day | ORAL | Status: DC
  Administered 2012-08-14 – 2012-08-15 (×2): 60 mg via ORAL
  Filled 2012-08-13 (×2): qty 1

## 2012-08-13 MED ORDER — ASPIRIN-ACETAMINOPHEN-CAFFEINE 250-250-65 MG PO TABS
1.0000 | ORAL_TABLET | Freq: Every day | ORAL | Status: DC | PRN
  Filled 2012-08-13: qty 1

## 2012-08-13 MED ORDER — ASPIRIN-ACETAMINOPHEN-CAFFEINE 260-130-16 MG PO TABS: 1.0000 | ORAL_TABLET | Freq: Every day | ORAL | Status: DC | PRN

## 2012-08-13 MED ORDER — SODIUM CHLORIDE 0.9 % IV SOLN
INTRAVENOUS | Status: DC
  Administered 2012-08-13: 22:00:00 via INTRAVENOUS

## 2012-08-13 MED ORDER — LISINOPRIL 10 MG PO TABS
10.0000 mg | ORAL_TABLET | Freq: Every day | ORAL | Status: DC
Start: 2012-08-13 — End: 2012-08-13

## 2012-08-13 MED ORDER — SUMATRIPTAN SUCCINATE 25 MG PO TABS
25.0000 mg | ORAL_TABLET | ORAL | Status: DC | PRN
  Filled 2012-08-13: qty 1

## 2012-08-13 MED ORDER — SODIUM CHLORIDE 0.9 % IV BOLUS (SEPSIS): 1000.0000 mL | Freq: Once | INTRAVENOUS | Status: DC

## 2012-08-13 MED ORDER — SODIUM CHLORIDE 0.9 % IV SOLN
1000.0000 mL | INTRAVENOUS | Status: DC
  Administered 2012-08-13: 1000 mL via INTRAVENOUS

## 2012-08-13 MED ORDER — HYDROCODONE-ACETAMINOPHEN 5-325 MG PO TABS: 1.0000 | ORAL_TABLET | Freq: Four times a day (QID) | ORAL | Status: DC | PRN

## 2012-08-13 NOTE — H&P (Signed)
[facility] History and Physical  [name]:[id number] DOB: [birth year] DOA: 08/13/2012  Referring physician: ED PCP: Astrid Divine, MD    Chief Complaint: Dizziness, lightheadedness  HPI: CHIRSTINE Rowe is a 67 y.o. female s/p back surgery complicated by PNA from 6/18 to 6/30 who had been doing well at home for about a week, ambulating with walker until today she felt generally weak and lightheaded after standing up from a seated position.  She sat down on the couch, felt a little better but was unable to stand back up and so had to crawl on the floor to get assistance to get up.  When she stood up again she had recurrent lightheadedness with SBP < 90 at home so EMS called.  Work up in the ED suggested dehydration as the most likely cause and the patients SBP has improved with IVF.  Hospitalist has been asked to admit.  Review of Systems: Positive for ongoing difficulty with breathing since time in hospital, 12 systems reviewed and otherwise negative.  Past Medical History  Diagnosis Date  . Hypertension   . Bipolar 1 disorder   . Thyroid disease   . Back pain   . Hyperlipidemia   . Complication of anesthesia     hard to wake up anesthesia  . Hypothyroidism   . Depression   . GERD (gastroesophageal reflux disease)   . Headache(784.0)     migraines and tension headaches  . Arthritis    Past Surgical History  Procedure Laterality Date  . Appendectomy    . Gastric bypass    . Back surgery    . Tonsillectomy    . Wrist surgery    . Eye surgery Bilateral     lens implant  . Nasal sinus surgery Bilateral   . Lumbar laminectomy/decompression microdiscectomy Right 07/23/2012    Procedure: LUMBAR LAMINECTOMY/DECOMPRESSION MICRODISCECTOMY 2 LEVELS;  Surgeon: Karn Cassis, MD;  Location: [facility];  Service: Neurosurgery;  Laterality: Right;  Right Lumbar three-four  lumbar four-five Laminectomy/Foraminotomy   Social History:  reports that she has  never smoked. She has never used smokeless tobacco. She reports that  drinks alcohol. She reports that she does not use illicit drugs.   Allergies  Allergen Reactions  . Cefadroxil Itching    Ends of hair itched   . Gabapentin Other (See Comments)    Right foot and leg swelled     No family history on file.  Prior to Admission medications   Medication Sig Start Date End Date Taking? Authorizing Provider  Aspirin-Acetaminophen-Caffeine 260-130-16 MG TABS Take 1 packet by mouth daily as needed. For headaches    Yes Historical Provider, MD  DULoxetine (CYMBALTA) 60 MG capsule Take 60 mg by mouth daily.   Yes Historical Provider, MD  HYDROcodone-acetaminophen (VICODIN) 5-500 MG per tablet Take 1 tablet by mouth every 6 (six) hours as needed. For back pain    Yes Historical Provider, MD  levothyroxine (SYNTHROID, LEVOTHROID) 50 MCG tablet Take 50 mcg by mouth daily before breakfast.   Yes Historical Provider, MD  lisinopril (PRINIVIL,ZESTRIL) 10 MG tablet Take 10 mg by mouth daily.    Yes Historical Provider, MD  omeprazole (PRILOSEC) 10 MG capsule Take 10 mg by mouth 2 (two) times daily.    Yes Historical Provider, MD  pravastatin (PRAVACHOL) 10 MG tablet Take 20 mg by mouth at bedtime.    Yes Historical Provider, MD  traZODone (DESYREL) 100 MG tablet Take 100-300 mg by mouth at  bedtime as needed and may repeat dose one time if needed for sleep.    Yes Historical Provider, MD  SUMAtriptan (IMITREX) 25 MG tablet Take 25 mg by mouth every 2 (two) hours as needed for migraine.    Historical Provider, MD   Physical Exam: Filed Vitals:   08/13/12 1815 08/13/12 1900 08/13/12 2015 08/13/12 2030  BP: 97/60 110/68    Pulse: 97 82    Temp:      TempSrc:      Resp: 16 19 19 17   SpO2: 100% 100%      General:  NAD, resting comfortably in bed, non-toxic appearing Eyes: PEERLA EOMI ENT: mucous membranes moist Neck: supple w/o JVD Cardiovascular: RRR w/o MRG Respiratory: CTA B, having to work  somewhat to breath though Abdomen: soft, nt, nd, bs+ Skin: no rash nor lesion Musculoskeletal: MAE, full ROM all 4 extremities Psychiatric: normal tone and affect Neurologic: AAOx3, grossly non-focal  Labs on Admission:  Basic Metabolic Panel:  Recent Labs Lab 08/13/12 1725  NA 135  K 4.3  CL 96  CO2 33*  GLUCOSE 92  BUN 14  CREATININE 1.90*  CALCIUM 8.5   Liver Function Tests:  Recent Labs Lab 08/13/12 1725  AST 20  ALT 10  ALKPHOS 63  BILITOT 0.4  PROT 5.4*  ALBUMIN 2.7*   No results found for this basename: LIPASE, AMYLASE,  in the last 168 hours No results found for this basename: AMMONIA,  in the last 168 hours CBC:  Recent Labs Lab 08/13/12 1725  WBC 9.9  NEUTROABS 6.2  HGB 9.3*  HCT 29.9*  MCV 98.7  PLT 365   Cardiac Enzymes: No results found for this basename: CKTOTAL, CKMB, CKMBINDEX, TROPONINI,  in the last 168 hours  BNP (last 3 results) No results found for this basename: PROBNP,  in the last 8760 hours CBG: No results found for this basename: GLUCAP,  in the last 168 hours  Radiological Exams on Admission: Dg Chest Port 1 View  08/13/2012   *RADIOLOGY REPORT*  Clinical Data: Sepsis  PORTABLE CHEST - 1 VIEW  Comparison: 08/01/2012.  Findings: Portable lordotic exam.  Elevated right hemidiaphragm.  Taking this limitation into account, no infiltrate is detected.  Mild central pulmonary vascular prominence without pulmonary edema.  No gross pneumothorax.  Heart size within normal limits.  Slightly tortuous aorta.  IMPRESSION: No segmental infiltrate.  Please see above.   Original Report Authenticated By: Lacy Duverney, M.D.    EKG: Independently reviewed.  Assessment/Plan Principal Problem:   Hypotension Active Problems:   Dehydration   AKI (acute kidney injury)   Hypoventilation associated with obesity syndrome   1. Hypotension - due to dehydration, got IVF bolus in ED, continuing at 125 cc/hr, hypotension resolved at this point. 2. AKI  - pre-renal, secondary to hypotension from dehydration, rehydrating, recheck BMP in AM.  Is and Os, renal US if AKI does not improve, but patient is noted to be passing urine at time of eval in ED and BPs got as low as 69/49 earlier in ED so obstruction is a distant second on the DDX despite recent back surgery. 3. HVOS - appears to have developed this last admission during her prolonged stay and PNA and although the PNA has resolved, the patient still has complaints of ongoing difficulty breathing when lying on her back, serum bicrab continues to be elevated suggesting ongoing pCO2 retention.    Code Status: Full Code (must indicate code status--if unknown or must be  presumed, indicate so) Family Communication: No family in room (indicate person spoken with, if applicable, with phone number if by telephone) Disposition Plan: Admit to obs (indicate anticipated LOS)  Time spent: 70 min  Christeena Krogh M. [facility] Pager [phone]  If 7PM-7AM, please contact night-coverage [email or web address] Password [facility] 08/13/2012, 8:42 PM

## 2012-08-13 NOTE — ED Notes (Signed)
Patient in bed, alert and oriented x3 patient states she is feeling better than she was before

## 2012-08-13 NOTE — ED Notes (Signed)
Code sepsis called.

## 2012-08-13 NOTE — ED Notes (Signed)
Pt with difficult IV acess. 

## 2012-08-13 NOTE — ED Provider Notes (Signed)
History    CSN: [id number] Arrival date & time 08/13/12  1552  First MD Initiated Contact with Patient 08/13/12 1628     Chief Complaint  Patient presents with  . Hypotension   (Consider location/radiation/quality/duration/timing/severity/associated sxs/prior Treatment) HPI This 67 year old female was recently discharged within the last couple weeks for lumbar surgery care by suspected aspiration pneumonia with delirium and hypoxia, she has been home for about a week and a half doing well walking with her walker until today she felt generally weak and lightheaded sat down on the couch was unable to stand up and had to crawl on the floor and get assistance to get up, she was able to walk again with assistance but then had recurrent lightheadedness with systolic blood pressure less than 90 at home so EMS was called for transport to the emergency room, there is no treatment prior to arrival, she is no fever no confusion no cough no chest pain no shortness breath no abdominal pain no vomiting no redness or postdrainage for back incision or dysuria no focal weakness or numbness no change in speech vision swallowing or understanding, her symptoms started with the last few hours prior to arrival. Past Medical History  Diagnosis Date  . Hypertension   . Bipolar 1 disorder   . Thyroid disease   . Back pain   . Hyperlipidemia   . Complication of anesthesia     hard to wake up anesthesia  . Hypothyroidism   . Depression   . GERD (gastroesophageal reflux disease)   . Headache(784.0)     migraines and tension headaches  . Arthritis    Past Surgical History  Procedure Laterality Date  . Appendectomy    . Gastric bypass    . Back surgery    . Tonsillectomy    . Wrist surgery    . Eye surgery Bilateral     lens implant  . Nasal sinus surgery Bilateral   . Lumbar laminectomy/decompression microdiscectomy Right 07/23/2012    Procedure: LUMBAR LAMINECTOMY/DECOMPRESSION MICRODISCECTOMY 2 LEVELS;   Surgeon: Karn Cassis, MD;  Location: [facility];  Service: Neurosurgery;  Laterality: Right;  Right Lumbar three-four  lumbar four-five Laminectomy/Foraminotomy   No family history on file. History  Substance Use Topics  . Smoking status: Never Smoker   . Smokeless tobacco: Never Used  . Alcohol Use: Yes     Comment: social   OB History   Grav Para Term Preterm Abortions TAB SAB Ect Mult Living                 Review of Systems 10 Systems reviewed and are negative for acute change except as noted in the HPI. Allergies  Cefadroxil and Gabapentin  Home Medications   Current Outpatient Rx  Name  Route  Sig  Dispense  Refill  . Aspirin-Acetaminophen-Caffeine 260-130-16 MG TABS   Oral   Take 1 packet by mouth daily as needed. For headaches          . DULoxetine (CYMBALTA) 60 MG capsule   Oral   Take 60 mg by mouth daily.         [name] HYDROcodone-acetaminophen (VICODIN) 5-500 MG per tablet   Oral   Take 1 tablet by mouth every 6 (six) hours as needed. For back pain          . levothyroxine (SYNTHROID, LEVOTHROID) 50 MCG tablet   Oral   Take 50 mcg by mouth daily before breakfast.         .  omeprazole (PRILOSEC) 10 MG capsule   Oral   Take 10 mg by mouth 2 (two) times daily.          . pravastatin (PRAVACHOL) 10 MG tablet   Oral   Take 20 mg by mouth at bedtime.          . traZODone (DESYREL) 100 MG tablet   Oral   Take 100-300 mg by mouth at bedtime as needed and may repeat dose one time if needed for sleep.          . SUMAtriptan (IMITREX) 25 MG tablet   Oral   Take 25 mg by mouth every 2 (two) hours as needed for migraine.          BP 120/71  Pulse 92  Temp(Src) 98.4 F (36.9 C) (Oral)  Resp 18  Ht 5' 3.5" (1.613 m)  Wt 258 lb 14.4 oz (117.436 kg)  BMI 45.14 kg/m2  SpO2 93% Physical Exam  Nursing note and vitals reviewed. Constitutional: She is oriented to person, place, and time.  Awake, alert, nontoxic appearance.  HENT:  Head:  Atraumatic.  Eyes: Right eye exhibits no discharge. Left eye exhibits no discharge.  Neck: Neck supple.  Cardiovascular: Normal rate and regular rhythm.   No murmur heard. Pulmonary/Chest: Effort normal and breath sounds normal. No respiratory distress. She has no wheezes. She has no rales. She exhibits no tenderness.  Abdominal: Soft. Bowel sounds are normal. She exhibits no distension and no mass. There is no tenderness. There is no rebound and no guarding.  Musculoskeletal: She exhibits no edema and no tenderness.  Baseline ROM, no obvious new focal weakness. Lumbar incision clean and dry no erythema or purulent drainage or dehiscence  Neurological: She is alert and oriented to person, place, and time.  Mental status and motor strength appears baseline for patient and situation.  Skin: No rash noted.  Psychiatric: She has a normal mood and affect.    ED Course  Procedures (including critical care time) Pt stable in ED with no significant deterioration in condition.Patient / Family / Caregiver informed of clinical course, understand medical decision-making process, and agree with plan.d/w Med for admit. Labs Reviewed  CBC WITH DIFFERENTIAL - Abnormal; Notable for the following:    RBC 3.03 (*)    Hemoglobin 9.3 (*)    HCT 29.9 (*)    All other components within normal limits  COMPREHENSIVE METABOLIC PANEL - Abnormal; Notable for the following:    CO2 33 (*)    Creatinine, Ser 1.90 (*)    Total Protein 5.4 (*)    Albumin 2.7 (*)    GFR calc non Af Amer 27 (*)    GFR calc Af Amer 31 (*)    All other components within normal limits  URINALYSIS, ROUTINE W REFLEX MICROSCOPIC - Abnormal; Notable for the following:    APPearance HAZY (*)    Bilirubin Urine SMALL (*)    All other components within normal limits  CBC - Abnormal; Notable for the following:    RBC 2.96 (*)    Hemoglobin 9.2 (*)    HCT 29.6 (*)    All other components within normal limits  BASIC METABOLIC PANEL -  Abnormal; Notable for the following:    Creatinine, Ser 1.45 (*)    GFR calc non Af Amer 37 (*)    GFR calc Af Amer 43 (*)    All other components within normal limits  BASIC METABOLIC PANEL - Abnormal; Notable for the  following:    GFR calc non Af Amer 56 (*)    GFR calc Af Amer 65 (*)    All other components within normal limits  CULTURE, BLOOD (ROUTINE X 2)  CULTURE, BLOOD (ROUTINE X 2)  URINE CULTURE  PROCALCITONIN  TSH  CG4 I-STAT (LACTIC ACID)  CG4 I-STAT (LACTIC ACID)   No results found. 1. Hypotension   2. Renal insufficiency   3. AKI (acute kidney injury)   4. Dehydration   5. Hypoventilation associated with obesity syndrome     MDM  The patient appears reasonably stabilized for admission considering the current resources, flow, and capabilities available in the ED at this time, and I doubt any other [facility] requiring further screening and/or treatment in the ED prior to admission.  Hurman Horn, MD 08/16/12 2052

## 2012-08-13 NOTE — ED Notes (Signed)
Pt brought to ED  With complaint of anxiety.As per pts physiotherapist she was very anxious and BP was 86/66.On EMS arrival BP was 110/78.Pt very anxious .

## 2012-08-14 ENCOUNTER — Encounter (HOSPITAL_COMMUNITY): Payer: Self-pay | Admitting: *Deleted

## 2012-08-14 LAB — URINE CULTURE
Colony Count: NO GROWTH
Culture: NO GROWTH

## 2012-08-14 LAB — BASIC METABOLIC PANEL
BUN: 12 mg/dL (ref 6–23)
CO2: 29 mEq/L (ref 19–32)
Calcium: 8.7 mg/dL (ref 8.4–10.5)
Chloride: 104 mEq/L (ref 96–112)
Creatinine, Ser: 1.45 mg/dL — ABNORMAL HIGH (ref 0.50–1.10)
GFR calc Af Amer: 43 mL/min — ABNORMAL LOW (ref >90)
GFR calc non Af Amer: 37 mL/min — ABNORMAL LOW (ref >90)
Glucose, Bld: 84 mg/dL (ref 70–99)
Potassium: 3.8 mEq/L (ref 3.5–5.1)
Sodium: 142 mEq/L (ref 135–145)

## 2012-08-14 LAB — CBC
HCT: 29.6 % — ABNORMAL LOW (ref 36.0–46.0)
Hemoglobin: 9.2 g/dL — ABNORMAL LOW (ref 12.0–15.0)
MCH: 31.1 pg (ref 26.0–34.0)
MCHC: 31.1 g/dL (ref 30.0–36.0)
MCV: 100 fL (ref 78.0–100.0)
Platelets: 334 10*3/uL (ref 150–400)
RBC: 2.96 MIL/uL — ABNORMAL LOW (ref 3.87–5.11)
RDW: 14.3 % (ref 11.5–15.5)
WBC: 5.6 10*3/uL (ref 4.0–10.5)

## 2012-08-14 LAB — TSH: TSH: 0.912 u[IU]/mL (ref 0.350–4.500)

## 2012-08-14 MED ORDER — SODIUM CHLORIDE 0.9 % IV SOLN: 1000.0000 mL | Freq: Once | INTRAVENOUS | Status: DC

## 2012-08-14 MED ORDER — SODIUM CHLORIDE 0.9 % IV SOLN
INTRAVENOUS | Status: DC
  Administered 2012-08-14: via INTRAVENOUS
  Administered 2012-08-14: 125 mL/h via INTRAVENOUS
  Administered 2012-08-15: 08:00:00 via INTRAVENOUS

## 2012-08-14 NOTE — Progress Notes (Signed)
Pt refusing to eat. Says she is sick of clear liquids and just won't eat until they giver her something else. Educated pt on the importance of eating and tha she will continue to get weak and dizzy if she doesn't eat. Encourage pt to eat.

## 2012-08-14 NOTE — Progress Notes (Signed)
[facility]  Patient Status: Active (receiving services up to time of hospitalization)  [facility] is providing the following services: RN, PT, OT, MSW and HHA  If patient discharges after hours, please call [phone].   Lindsey Rowe 08/14/2012, 10:00 AM

## 2012-08-14 NOTE — Progress Notes (Signed)
[facility] PROGRESS NOTE  MIKAELA HILGEMAN ZOX:[id number] DOB: 1945-12-16 DOA: 08/13/2012 PCP: Astrid Divine, MD  Assessment/Plan: Principal Problem:   Hypotension Active Problems:   Dehydration   AKI (acute kidney injury)   Hypoventilation associated with obesity syndrome     Acute kidney injury Likely prerenal, continue aggressive hydration  Hypertension Holding lisinopril due to low blood pressure   Recent back surgery Doubt infection or source of his sepsis White count normal, afebrile   Code Status: full Family Communication: family updated about patient's clinical progress Disposition Plan:  As above    Brief narrative: Lindsey Rowe is a 67 y.o. female s/p back surgery complicated by PNA from 6/18 to 6/30 who had been doing well at home for about a week, ambulating with walker until today she felt generally weak and lightheaded after standing up from a seated position. She sat down on the couch, felt a little better but was unable to stand back up and so had to crawl on the floor to get assistance to get up. When she stood up again she had recurrent lightheadedness with SBP < 90 at home so EMS called.  Work up in the ED suggested dehydration as the most likely cause and the patients SBP has improved with IVF. Hospitalist has been asked to admit.   Consultants: None Procedures:  None  Antibiotics: None  HPI/Subjective: No complaints excellent blood pressures improved overnight feels better  Objective: Filed Vitals:   08/13/12 2015 08/13/12 2030 08/13/12 2129 08/14/12 0540  BP:   103/54 117/79  Pulse:   98 96  Temp:   98.3 F (36.8 C) 98.1 F (36.7 C)  TempSrc:   Oral Oral  Resp: 19 17 18 18   Height:   5' 3.5" (1.613 m)   Weight:   118.253 kg (260 lb 11.2 oz)   SpO2:   99% 96%    Intake/Output Summary (Last 24 hours) at 08/14/12 0826 Last data filed at 08/14/12 0542  Gross per 24 hour  Intake      0 ml  Output   1550 ml  Net   -1550 ml    Exam:  HENT:  Head: Atraumatic.  Nose: Nose normal.  Mouth/Throat: Oropharynx is clear and moist.  Eyes: Conjunctivae are normal. Pupils are equal, round, and reactive to light. No scleral icterus.  Neck: Neck supple. No tracheal deviation present.  Cardiovascular: Normal rate, regular rhythm, normal heart sounds and intact distal pulses.  Pulmonary/Chest: Effort normal and breath sounds normal. No respiratory distress.  Abdominal: Soft. Normal appearance and bowel sounds are normal. She exhibits no distension. There is no tenderness.  Musculoskeletal: She exhibits no edema and no tenderness.  Neurological: She is alert. No cranial nerve deficit.    Data Reviewed: Basic Metabolic Panel:  Recent Labs Lab 08/13/12 1725 08/14/12 0610  NA 135 142  K 4.3 3.8  CL 96 104  CO2 33* 29  GLUCOSE 92 84  BUN 14 12  CREATININE 1.90* 1.45*  CALCIUM 8.5 8.7    Liver Function Tests:  Recent Labs Lab 08/13/12 1725  AST 20  ALT 10  ALKPHOS 63  BILITOT 0.4  PROT 5.4*  ALBUMIN 2.7*   No results found for this basename: LIPASE, AMYLASE,  in the last 168 hours No results found for this basename: AMMONIA,  in the last 168 hours  CBC:  Recent Labs Lab 08/13/12 1725 08/14/12 0610  WBC 9.9 5.6  NEUTROABS 6.2  --   HGB 9.3* 9.2*  HCT 29.9* 29.6*  MCV 98.7 100.0  PLT 365 334    Cardiac Enzymes: No results found for this basename: CKTOTAL, CKMB, CKMBINDEX, TROPONINI,  in the last 168 hours BNP (last 3 results) No results found for this basename: PROBNP,  in the last 8760 hours   CBG: No results found for this basename: GLUCAP,  in the last 168 hours  No results found for this or any previous visit (from the past 240 hour(s)).   Studies: Dg Chest 2 View  07/21/2012   *RADIOLOGY REPORT*  Clinical Data: Preoperative respiratory exam.  Multilevel spinal stenosis.  CHEST - 2 VIEW  Comparison: Chest x-ray dated 03/31/2009  Findings: The heart size and pulmonary  vascularity are normal and the lungs are clear.  No significant osseous abnormality.  IMPRESSION: No acute disease in the chest.   Original Report Authenticated By: Francene Boyers, M.D.   Dg Lumbar Spine 2-3 Views  07/25/2012   CLINICAL DATA:  L3-4, L4-5laminectomy.  EXAM: DG C-ARM 1-60 MIN;LUMBAR SPINE - 1 VIEW  COMPARISON:  Intraoperative images 07/23/2012. Prior myelogram 05/27/2012.  FLUOROSCOPY TIME:  Fluoroscopy was performed for intraoperative purposes. Please see operative report for FLUOROSCOPY TIME.  FINDINGS: AP and lateral intraoperative spot images demonstrate posterior localizing instruments directed at the right L4 pedicle and L5 pedicle with additional instrument at the L4-5 disc space.  IMPRESSION: Intraoperative localization as above.   Electronically Signed   By: Charlett Nose   On: 07/25/2012 11:00   Dg Lumbar Spine 1 View  07/23/2012   *RADIOLOGY REPORT*  Clinical Data: Lumbar surgery at L3-L4, L4-L5 on right  LUMBAR SPINE - 1 VIEW  Comparison: Portable cross-table lateral intraoperative image at 1150 hours compared to CT myelogram images of 05/27/2012.  Findings: CT myelographic exam is labeled with five lumbar type vertebrae. Current exam is labeled similarly.  Quantum mottling artifacts severely limit exam. Diffuse disc space narrowing throughout lumbar region. Posterior metallic probes are located at the level of the L3-L4 disc space and at the mid L5 level.  IMPRESSION: Posterior localization of the L3-L4 disc space and mid L5 levels.   Original Report Authenticated By: Ulyses Southward, M.D.   Dg Chest Port 1 View  08/13/2012   *RADIOLOGY REPORT*  Clinical Data: Sepsis  PORTABLE CHEST - 1 VIEW  Comparison: 08/01/2012.  Findings: Portable lordotic exam.  Elevated right hemidiaphragm.  Taking this limitation into account, no infiltrate is detected.  Mild central pulmonary vascular prominence without pulmonary edema.  No gross pneumothorax.  Heart size within normal limits.  Slightly tortuous  aorta.  IMPRESSION: No segmental infiltrate.  Please see above.   Original Report Authenticated By: Lacy Duverney, M.D.   Dg Chest Port 1 View  07/28/2012   *RADIOLOGY REPORT*  Clinical Data: Follow-up pneumonia.  PORTABLE CHEST - 1 VIEW  Comparison: 07/26/2012  Findings: Right PICC line is in place with the tip at the cavoatrial junction.  Improving bilateral airspace opacities. Patchy right upper lobe airspace opacities persist.  Mild vascular congestion.  No effusions.  No acute bony abnormality.  IMPRESSION: Improving aeration and bilateral airspace disease.  Mild patchy right upper lobe airspace opacity persists.   Original Report Authenticated By: Charlett Nose, M.D.   Dg Chest Port 1 View  07/26/2012   *RADIOLOGY REPORT*  Clinical Data: Respiratory distress  PORTABLE CHEST - 1 VIEW  Comparison: 07/26/2012  Findings: There is mild cardiac enlargement.  The lung volumes are low and there is asymmetric elevation of the right hemidiaphragm.  Persistent right upper lobe airspace consolidation and left lung perihilar infiltrates.  New hazy lung opacities are identified within the right lower lobe.  IMPRESSION:  1.  No change and right upper lobe consolidation. 2.  Worsening aeration to the right base.   Original Report Authenticated By: Signa Kell, M.D.   Dg Chest Port 1 View  07/26/2012   *RADIOLOGY REPORT*  Clinical Data: Acute respiratory distress, post lumbar laminectomy 07/23/2012  PORTABLE CHEST - 1 VIEW  Comparison: Portable exam 0605 hours compared to 07/21/2012  Findings: Upper normal heart size. New right upper lobe and left perihilar infiltrates since previous exam. Decreased lung volumes with atelectasis at right base. No pneumothorax or acute osseous findings.  IMPRESSION: New right upper lobe and left perihilar infiltrates. Right basilar atelectasis.   Original Report Authenticated By: Ulyses Southward, M.D.   Dg C-arm 1-60 Min  07/23/2012   CLINICAL DATA:  L3-4, L4-5 laminectomy.  EXAM: DG C-ARM  1-60 MIN; LUMBAR SPINE - 1 VIEW  COMPARISON:  Intraoperative images 07/23/2012. Prior myelogram 05/27/2012.  FLUOROSCOPY TIME:  Fluoroscopy was performed for intraoperative purposes. Please see operative report for FLUOROSCOPY TIME.  FINDINGS: AP and lateral intraoperative spot images demonstrate posterior localizing instruments directed at the right L4 pedicle and L5 pedicle with additional instrument at the L4-5 disc space.  IMPRESSION: Intraoperative localization as above.   Electronically Signed   By: Charlett Nose   On: 07/23/2012 14:54    Scheduled Meds: . DULoxetine  60 mg Oral Daily  . heparin  5,000 Units Subcutaneous Q8H  . levothyroxine  50 mcg Oral QAC breakfast  . pantoprazole  40 mg Oral Daily  . simvastatin  10 mg Oral q1800  . sodium chloride  1,000 mL Intravenous Once  . sodium chloride  3 mL Intravenous Q12H  . [DISCONTINUED] sodium chloride   Intravenous STAT   Continuous Infusions: . sodium chloride      Principal Problem:   Hypotension Active Problems:   Dehydration   AKI (acute kidney injury)   Hypoventilation associated with obesity syndrome    Time spent: 40 minutes   [facility]  [facility] Pager [phone]. If 8PM-8AM, please contact night-coverage at [email or web address], password [facility] 08/14/2012, 8:26 AM  LOS: 1 day

## 2012-08-14 NOTE — Progress Notes (Signed)
INITIAL NUTRITION ASSESSMENT  DOCUMENTATION CODES Per approved criteria  -Morbid Obesity   INTERVENTION: 1. Encouraged oral intake, diet to be advanced per pt preference by MD.  2. Provide contact information for OP RD per pt request  NUTRITION DIAGNOSIS: Inadequate oral intake related to poor appetite as evidenced by weight loss, hypotension.   Goal: PO intake to meet >/=90% estimated nutrition needs.   Monitor:  PO intake, weight trends, labs, education needs.   Reason for Assessment: Malnutrition Screening Tool  67 y.o. female  Admitting Dx: Hypotension  ASSESSMENT: Pt was admitted with hypotension, dizziness, s/p Lumbar laminectomy/decompression microdiscectomy on 6/18. Pt reports since that time she has not been eating as well and has lost about 15 lbs per her home scale. Pt also actively trying to lose weight. States she would like to work with a RD to lose weight in a healthy way. RD will provide contact information for OP RD's.     Height: Ht Readings from Last 1 Encounters:  08/13/12 5' 3.5" (1.613 m)    Weight: Wt Readings from Last 1 Encounters:  08/13/12 260 lb 11.2 oz (118.253 kg)    Ideal Body Weight: 117.5 lbs   % Ideal Body Weight: 220%  Wt Readings from Last 10 Encounters:  08/13/12 260 lb 11.2 oz (118.253 kg)  07/28/12 276 lb 7.3 oz (125.4 kg)  07/28/12 276 lb 7.3 oz (125.4 kg)  07/21/12 258 lb (117.028 kg)  03/16/10 267 lb 12.8 oz (121.473 kg)  12/26/09 264 lb 12.8 oz (120.112 kg)  10/24/09 271 lb (122.925 kg)  09/23/09 266 lb (120.657 kg)  07/05/09 267 lb 6.4 oz (121.292 kg)  05/02/09 273 lb (123.832 kg)    Usual Body Weight: 276 lbs per weight hx. Pt reports that she has a hx of fluctuating weight.   % Usual Body Weight: 94%  BMI:  Body mass index is 45.45 kg/(m^2). Obesity class 3, extreme  Estimated Nutritional Needs: Kcal: 2000-2200 Protein: 70-80 gm  Fluid: 2-2.2 L   Skin: intact   Diet Order: General  EDUCATION  NEEDS: -No education needs identified at this time   Intake/Output Summary (Last 24 hours) at 08/14/12 1040 Last data filed at 08/14/12 0542  Gross per 24 hour  Intake      0 ml  Output   1550 ml  Net  -1550 ml    Last BM: PTA    Labs:   Recent Labs Lab 08/13/12 1725 08/14/12 0610  NA 135 142  K 4.3 3.8  CL 96 104  CO2 33* 29  BUN 14 12  CREATININE 1.90* 1.45*  CALCIUM 8.5 8.7  GLUCOSE 92 84    CBG (last 3)  No results found for this basename: GLUCAP,  in the last 72 hours  Scheduled Meds: . DULoxetine  60 mg Oral Daily  . heparin  5,000 Units Subcutaneous Q8H  . levothyroxine  50 mcg Oral QAC breakfast  . pantoprazole  40 mg Oral Daily  . simvastatin  10 mg Oral q1800  . sodium chloride  1,000 mL Intravenous Once  . sodium chloride  3 mL Intravenous Q12H    Continuous Infusions: . sodium chloride 1,000 mL (08/14/12 0930)    Past Medical History  Diagnosis Date  . Hypertension   . Bipolar 1 disorder   . Thyroid disease   . Back pain   . Hyperlipidemia   . Complication of anesthesia     hard to wake up anesthesia  . Hypothyroidism   .  Depression   . GERD (gastroesophageal reflux disease)   . Headache(784.0)     migraines and tension headaches  . Arthritis     Past Surgical History  Procedure Laterality Date  . Appendectomy    . Gastric bypass    . Back surgery    . Tonsillectomy    . Wrist surgery    . Eye surgery Bilateral     lens implant  . Nasal sinus surgery Bilateral   . Lumbar laminectomy/decompression microdiscectomy Right 07/23/2012    Procedure: LUMBAR LAMINECTOMY/DECOMPRESSION MICRODISCECTOMY 2 LEVELS;  Surgeon: Karn Cassis, MD;  Location: [facility];  Service: Neurosurgery;  Laterality: Right;  Right Lumbar three-four  lumbar four-five Laminectomy/Foraminotomy    Clarene Duke RD, LDN Pager [phone] After Hours pager [phone]

## 2012-08-15 LAB — BASIC METABOLIC PANEL
BUN: 10 mg/dL (ref 6–23)
CO2: 29 mEq/L (ref 19–32)
Calcium: 8.4 mg/dL (ref 8.4–10.5)
Chloride: 107 mEq/L (ref 96–112)
Creatinine, Ser: 1.02 mg/dL (ref 0.50–1.10)
GFR calc Af Amer: 65 mL/min — ABNORMAL LOW (ref >90)
GFR calc non Af Amer: 56 mL/min — ABNORMAL LOW (ref >90)
Glucose, Bld: 88 mg/dL (ref 70–99)
Potassium: 3.9 mEq/L (ref 3.5–5.1)
Sodium: 143 mEq/L (ref 135–145)

## 2012-08-15 NOTE — Discharge Summary (Signed)
Physician Discharge Summary  Lindsey Rowe MRN: [id number] DOB/AGE: 1945-05-22 67 y.o.  PCP: Astrid Divine, MD   Admit date: 08/13/2012 Discharge date: 08/15/2012  Discharge Diagnoses:      Hypotension Active Problems:   Dehydration   AKI (acute kidney injury)   Hypoventilation associated with obesity syndrome     Medication List    STOP taking these medications       lisinopril 10 MG tablet  Commonly known as:  PRINIVIL,ZESTRIL      TAKE these medications       Aspirin-Acetaminophen-Caffeine 260-130-16 MG Tabs  Take 1 packet by mouth daily as needed. For headaches     DULoxetine 60 MG capsule  Commonly known as:  CYMBALTA  Take 60 mg by mouth daily.     HYDROcodone-acetaminophen 5-500 MG per tablet  Commonly known as:  VICODIN  Take 1 tablet by mouth every 6 (six) hours as needed. For back pain     levothyroxine 50 MCG tablet  Commonly known as:  SYNTHROID, LEVOTHROID  Take 50 mcg by mouth daily before breakfast.     omeprazole 10 MG capsule  Commonly known as:  PRILOSEC  Take 10 mg by mouth 2 (two) times daily.     pravastatin 10 MG tablet  Commonly known as:  PRAVACHOL  Take 20 mg by mouth at bedtime.     SUMAtriptan 25 MG tablet  Commonly known as:  IMITREX  Take 25 mg by mouth every 2 (two) hours as needed for migraine.     traZODone 100 MG tablet  Commonly known as:  DESYREL  Take 100-300 mg by mouth at bedtime as needed and may repeat dose one time if needed for sleep.        Discharge Condition: Stable   Disposition: 01-Home or Self Care   Consults: None  Significant Diagnostic Studies: Dg Chest 2 View  07/21/2012   *RADIOLOGY REPORT*  Clinical Data: Preoperative respiratory exam.  Multilevel spinal stenosis.  CHEST - 2 VIEW  Comparison: Chest x-ray dated 03/31/2009  Findings: The heart size and pulmonary vascularity are normal and the lungs are clear.  No significant osseous abnormality.  IMPRESSION: No acute disease  in the chest.   Original Report Authenticated By: Francene Boyers, M.D.   Dg Lumbar Spine 2-3 Views  07/25/2012   CLINICAL DATA:  L3-4, L4-5laminectomy.  EXAM: DG C-ARM 1-60 MIN;LUMBAR SPINE - 1 VIEW  COMPARISON:  Intraoperative images 07/23/2012. Prior myelogram 05/27/2012.  FLUOROSCOPY TIME:  Fluoroscopy was performed for intraoperative purposes. Please see operative report for FLUOROSCOPY TIME.  FINDINGS: AP and lateral intraoperative spot images demonstrate posterior localizing instruments directed at the right L4 pedicle and L5 pedicle with additional instrument at the L4-5 disc space.  IMPRESSION: Intraoperative localization as above.   Electronically Signed   By: Charlett Nose   On: 07/25/2012 11:00   Dg Lumbar Spine 1 View  07/23/2012   *RADIOLOGY REPORT*  Clinical Data: Lumbar surgery at L3-L4, L4-L5 on right  LUMBAR SPINE - 1 VIEW  Comparison: Portable cross-table lateral intraoperative image at 1150 hours compared to CT myelogram images of 05/27/2012.  Findings: CT myelographic exam is labeled with five lumbar type vertebrae. Current exam is labeled similarly.  Quantum mottling artifacts severely limit exam. Diffuse disc space narrowing throughout lumbar region. Posterior metallic probes are located at the level of the L3-L4 disc space and at the mid L5 level.  IMPRESSION: Posterior localization of the L3-L4 disc space and mid L5  levels.   Original Report Authenticated By: Ulyses Southward, M.D.   Dg Chest Port 1 View  08/13/2012   *RADIOLOGY REPORT*  Clinical Data: Sepsis  PORTABLE CHEST - 1 VIEW  Comparison: 08/01/2012.  Findings: Portable lordotic exam.  Elevated right hemidiaphragm.  Taking this limitation into account, no infiltrate is detected.  Mild central pulmonary vascular prominence without pulmonary edema.  No gross pneumothorax.  Heart size within normal limits.  Slightly tortuous aorta.  IMPRESSION: No segmental infiltrate.  Please see above.   Original Report Authenticated By: Lacy Duverney,  M.D.   Dg Chest Port 1 View  07/28/2012   *RADIOLOGY REPORT*  Clinical Data: Follow-up pneumonia.  PORTABLE CHEST - 1 VIEW  Comparison: 07/26/2012  Findings: Right PICC line is in place with the tip at the cavoatrial junction.  Improving bilateral airspace opacities. Patchy right upper lobe airspace opacities persist.  Mild vascular congestion.  No effusions.  No acute bony abnormality.  IMPRESSION: Improving aeration and bilateral airspace disease.  Mild patchy right upper lobe airspace opacity persists.   Original Report Authenticated By: Charlett Nose, M.D.   Dg Chest Port 1 View  07/26/2012   *RADIOLOGY REPORT*  Clinical Data: Respiratory distress  PORTABLE CHEST - 1 VIEW  Comparison: 07/26/2012  Findings: There is mild cardiac enlargement.  The lung volumes are low and there is asymmetric elevation of the right hemidiaphragm. Persistent right upper lobe airspace consolidation and left lung perihilar infiltrates.  New hazy lung opacities are identified within the right lower lobe.  IMPRESSION:  1.  No change and right upper lobe consolidation. 2.  Worsening aeration to the right base.   Original Report Authenticated By: Signa Kell, M.D.   Dg Chest Port 1 View  07/26/2012   *RADIOLOGY REPORT*  Clinical Data: Acute respiratory distress, post lumbar laminectomy 07/23/2012  PORTABLE CHEST - 1 VIEW  Comparison: Portable exam 0605 hours compared to 07/21/2012  Findings: Upper normal heart size. New right upper lobe and left perihilar infiltrates since previous exam. Decreased lung volumes with atelectasis at right base. No pneumothorax or acute osseous findings.  IMPRESSION: New right upper lobe and left perihilar infiltrates. Right basilar atelectasis.   Original Report Authenticated By: Ulyses Southward, M.D.   Dg C-arm 1-60 Min  07/23/2012   CLINICAL DATA:  L3-4, L4-5 laminectomy.  EXAM: DG C-ARM 1-60 MIN; LUMBAR SPINE - 1 VIEW  COMPARISON:  Intraoperative images 07/23/2012. Prior myelogram 05/27/2012.   FLUOROSCOPY TIME:  Fluoroscopy was performed for intraoperative purposes. Please see operative report for FLUOROSCOPY TIME.  FINDINGS: AP and lateral intraoperative spot images demonstrate posterior localizing instruments directed at the right L4 pedicle and L5 pedicle with additional instrument at the L4-5 disc space.  IMPRESSION: Intraoperative localization as above.   Electronically Signed   By: Charlett Nose   On: 07/23/2012 14:54       Microbiology: Recent Results (from the past 240 hour(s))  URINE CULTURE     Status: None   Collection Time    08/13/12  5:57 PM      Result Value Range Status   Specimen Description URINE, CATHETERIZED   Final   Special Requests NONE   Final   Culture  Setup Time 08/14/2012 00:12   Final   Colony Count NO GROWTH   Final   Culture NO GROWTH   Final   Report Status 08/14/2012 FINAL   Final     Labs: Results for orders placed during the hospital encounter of 08/13/12 (from the past  48 hour(s))  CBC WITH DIFFERENTIAL     Status: Abnormal   Collection Time    08/13/12  5:25 PM      Result Value Range   WBC 9.9  4.0 - 10.5 K/uL   RBC 3.03 (*) 3.87 - 5.11 MIL/uL   Hemoglobin 9.3 (*) 12.0 - 15.0 g/dL   HCT 16.1 (*) 09.6 - 04.5 %   MCV 98.7  78.0 - 100.0 fL   MCH 30.7  26.0 - 34.0 pg   MCHC 31.1  30.0 - 36.0 g/dL   RDW 40.9  81.1 - 91.4 %   Platelets 365  150 - 400 K/uL   Neutrophils Relative % 63  43 - 77 %   Neutro Abs 6.2  1.7 - 7.7 K/uL   Lymphocytes Relative 27  12 - 46 %   Lymphs Abs 2.7  0.7 - 4.0 K/uL   Monocytes Relative 9  3 - 12 %   Monocytes Absolute 0.9  0.1 - 1.0 K/uL   Eosinophils Relative 1  0 - 5 %   Eosinophils Absolute 0.1  0.0 - 0.7 K/uL   Basophils Relative 0  0 - 1 %   Basophils Absolute 0.0  0.0 - 0.1 K/uL  COMPREHENSIVE METABOLIC PANEL     Status: Abnormal   Collection Time    08/13/12  5:25 PM      Result Value Range   Sodium 135  135 - 145 mEq/L   Potassium 4.3  3.5 - 5.1 mEq/L   Chloride 96  96 - 112 mEq/L   CO2  33 (*) 19 - 32 mEq/L   Glucose, Bld 92  70 - 99 mg/dL   BUN 14  6 - 23 mg/dL   Creatinine, Ser 7.82 (*) 0.50 - 1.10 mg/dL   Calcium 8.5  8.4 - 95.6 mg/dL   Total Protein 5.4 (*) 6.0 - 8.3 g/dL   Albumin 2.7 (*) 3.5 - 5.2 g/dL   AST 20  0 - 37 U/L   ALT 10  0 - 35 U/L   Alkaline Phosphatase 63  39 - 117 U/L   Total Bilirubin 0.4  0.3 - 1.2 mg/dL   GFR calc non Af Amer 27 (*) >90 mL/min   GFR calc Af Amer 31 (*) >90 mL/min   Comment:            The eGFR has been calculated     using the CKD EPI equation.     This calculation has not been     validated in all clinical     situations.     eGFR's persistently     <90 mL/min signify     possible Chronic Kidney Disease.  PROCALCITONIN     Status: None   Collection Time    08/13/12  5:25 PM      Result Value Range   Procalcitonin 0.11     Comment:            Interpretation:     PCT (Procalcitonin) <= 0.5 ng/mL:     Systemic infection (sepsis) is not likely.     Local bacterial infection is possible.     (NOTE)             ICU PCT Algorithm               Non ICU PCT Algorithm        ----------------------------     ------------------------------  PCT < 0.25 ng/mL                 PCT < 0.1 ng/mL         Stopping of antibiotics            Stopping of antibiotics           strongly encouraged.               strongly encouraged.        ----------------------------     ------------------------------           PCT level decrease by               PCT < 0.25 ng/mL           >= 80% from peak PCT           OR PCT 0.25 - 0.5 ng/mL          Stopping of antibiotics                                                 encouraged.         Stopping of antibiotics               encouraged.        ----------------------------     ------------------------------           PCT level decrease by              PCT >= 0.25 ng/mL           < 80% from peak PCT            AND PCT >= 0.5 ng/mL            Continuing antibiotics                                                   encouraged.           Continuing antibiotics                encouraged.        ----------------------------     ------------------------------         PCT level increase compared          PCT > 0.5 ng/mL             with peak PCT AND              PCT >= 0.5 ng/mL             Escalation of antibiotics                                              strongly encouraged.          Escalation of antibiotics            strongly encouraged.  CG4 I-STAT (LACTIC ACID)     Status: None   Collection Time    08/13/12  5:50 PM      Result Value Range   Lactic Acid, Venous  1.84  0.5 - 2.2 mmol/L  URINALYSIS, ROUTINE W REFLEX MICROSCOPIC     Status: Abnormal   Collection Time    08/13/12  5:57 PM      Result Value Range   Color, Urine YELLOW  YELLOW   APPearance HAZY (*) CLEAR   Specific Gravity, Urine 1.014  1.005 - 1.030   pH 5.0  5.0 - 8.0   Glucose, UA NEGATIVE  NEGATIVE mg/dL   Hgb urine dipstick NEGATIVE  NEGATIVE   Bilirubin Urine SMALL (*) NEGATIVE   Ketones, ur NEGATIVE  NEGATIVE mg/dL   Protein, ur NEGATIVE  NEGATIVE mg/dL   Urobilinogen, UA 1.0  0.0 - 1.0 mg/dL   Nitrite NEGATIVE  NEGATIVE   Leukocytes, UA NEGATIVE  NEGATIVE   Comment: MICROSCOPIC NOT DONE ON URINES WITH NEGATIVE PROTEIN, BLOOD, LEUKOCYTES, NITRITE, OR GLUCOSE <1000 mg/dL.  URINE CULTURE     Status: None   Collection Time    08/13/12  5:57 PM      Result Value Range   Specimen Description URINE, CATHETERIZED     Special Requests NONE     Culture  Setup Time 08/14/2012 00:12     Colony Count NO GROWTH     Culture NO GROWTH     Report Status 08/14/2012 FINAL    CG4 I-STAT (LACTIC ACID)     Status: None   Collection Time    08/13/12  7:00 PM      Result Value Range   Lactic Acid, Venous 1.54  0.5 - 2.2 mmol/L  TSH     Status: None   Collection Time    08/13/12  9:09 PM      Result Value Range   TSH 0.912  0.350 - 4.500 uIU/mL  CBC     Status: Abnormal   Collection Time    08/14/12   6:10 AM      Result Value Range   WBC 5.6  4.0 - 10.5 K/uL   RBC 2.96 (*) 3.87 - 5.11 MIL/uL   Hemoglobin 9.2 (*) 12.0 - 15.0 g/dL   HCT 53.6 (*) 64.4 - 03.4 %   MCV 100.0  78.0 - 100.0 fL   MCH 31.1  26.0 - 34.0 pg   MCHC 31.1  30.0 - 36.0 g/dL   RDW 74.2  59.5 - 63.8 %   Platelets 334  150 - 400 K/uL  BASIC METABOLIC PANEL     Status: Abnormal   Collection Time    08/14/12  6:10 AM      Result Value Range   Sodium 142  135 - 145 mEq/L   Potassium 3.8  3.5 - 5.1 mEq/L   Chloride 104  96 - 112 mEq/L   CO2 29  19 - 32 mEq/L   Glucose, Bld 84  70 - 99 mg/dL   BUN 12  6 - 23 mg/dL   Creatinine, Ser 7.56 (*) 0.50 - 1.10 mg/dL   Calcium 8.7  8.4 - 43.3 mg/dL   GFR calc non Af Amer 37 (*) >90 mL/min   GFR calc Af Amer 43 (*) >90 mL/min   Comment:            The eGFR has been calculated     using the CKD EPI equation.     This calculation has not been     validated in all clinical     situations.     eGFR's persistently     <90 mL/min signify  possible Chronic Kidney Disease.  BASIC METABOLIC PANEL     Status: Abnormal   Collection Time    08/15/12  5:45 AM      Result Value Range   Sodium 143  135 - 145 mEq/L   Potassium 3.9  3.5 - 5.1 mEq/L   Chloride 107  96 - 112 mEq/L   CO2 29  19 - 32 mEq/L   Glucose, Bld 88  70 - 99 mg/dL   BUN 10  6 - 23 mg/dL   Creatinine, Ser 1.30  0.50 - 1.10 mg/dL   Calcium 8.4  8.4 - 86.5 mg/dL   GFR calc non Af Amer 56 (*) >90 mL/min   GFR calc Af Amer 65 (*) >90 mL/min   Comment:            The eGFR has been calculated     using the CKD EPI equation.     This calculation has not been     validated in all clinical     situations.     eGFR's persistently     <90 mL/min signify     possible Chronic Kidney Disease.     HPI :* Lindsey Rowe is a 67 y.o. female s/p back surgery complicated by PNA from 6/18 to 6/30 who had been doing well at home for about a week, ambulating with walker until today she felt generally weak and  lightheaded after standing up from a seated position. She sat down on the couch, felt a little better but was unable to stand back up and so had to crawl on the floor to get assistance to get up. When she stood up again she had recurrent lightheadedness with SBP < 90 at home so EMS called.  Work up in the ED suggested dehydration as the most likely cause and the patients SBP has improved with IVF. Hospitalist has been asked to admit.   HOSPITAL COURSE:   Acute kidney injury  Likely prerenal, improved with aggressive IV hydration, creatinine was 1.9 upon presentation now down to 1.02   Hypertension  Holding lisinopril due to low blood pressure  Repeat BMP in one week  If ok resume ACE    Recent back surgery  Doubt infection or source of his sepsis  White count normal, afebrile    Discharge Exam:  Blood pressure 120/71, pulse 92, temperature 98.4 F (36.9 C), temperature source Oral, resp. rate 18, height 5' 3.5" (1.613 m), weight 117.436 kg (258 lb 14.4 oz), SpO2 93.00%.  Cardiovascular: Normal rate, regular rhythm, normal heart sounds and intact distal pulses.  Pulmonary/Chest: Effort normal and breath sounds normal. No respiratory distress.  Abdominal: Soft. Normal appearance and bowel sounds are normal. She exhibits no distension. There is no tenderness.  Musculoskeletal: She exhibits no edema and no tenderness.  Neurological: She is alert. No cranial nerve deficit.         Future Appointments Provider Department Dept Phone   11/19/2012 12:45 PM Sherrie George, MD [facility] [phone]        Signed: Richarda Overlie 08/15/2012, 8:26 AM

## 2012-08-15 NOTE — Progress Notes (Signed)
Patient discharged to home. Patient AVS reviewed. Patient verbalized understanding of medications and follow-up appointments.  Patient remains stable; no signs or symptoms of distress.  Patient educated to return to the ER in cases of SOB, dizziness, fever, chest pain, or fainting.  

## 2012-08-18 ENCOUNTER — Emergency Department (HOSPITAL_COMMUNITY)
Admission: EM | Admit: 2012-08-18 | Discharge: 2012-08-18 | Disposition: A | Discharge status: Home / Self Care | Payer: Medicare Other | Attending: Emergency Medicine | Admitting: Emergency Medicine

## 2012-08-18 ENCOUNTER — Encounter (HOSPITAL_COMMUNITY): Payer: Self-pay | Admitting: *Deleted

## 2012-08-18 ENCOUNTER — Emergency Department (HOSPITAL_COMMUNITY): Payer: Medicare Other

## 2012-08-18 DIAGNOSIS — M129 Arthropathy, unspecified: Secondary | ICD-10-CM | POA: Insufficient documentation

## 2012-08-18 DIAGNOSIS — K219 Gastro-esophageal reflux disease without esophagitis: Secondary | ICD-10-CM | POA: Insufficient documentation

## 2012-08-18 DIAGNOSIS — E785 Hyperlipidemia, unspecified: Secondary | ICD-10-CM | POA: Insufficient documentation

## 2012-08-18 DIAGNOSIS — L0390 Cellulitis, unspecified: Secondary | ICD-10-CM

## 2012-08-18 DIAGNOSIS — M25476 Effusion, unspecified foot: Secondary | ICD-10-CM | POA: Insufficient documentation

## 2012-08-18 DIAGNOSIS — Z888 Allergy status to other drugs, medicaments and biological substances status: Secondary | ICD-10-CM | POA: Insufficient documentation

## 2012-08-18 DIAGNOSIS — I10 Essential (primary) hypertension: Secondary | ICD-10-CM | POA: Insufficient documentation

## 2012-08-18 DIAGNOSIS — M549 Dorsalgia, unspecified: Secondary | ICD-10-CM | POA: Insufficient documentation

## 2012-08-18 DIAGNOSIS — L02619 Cutaneous abscess of unspecified foot: Secondary | ICD-10-CM | POA: Insufficient documentation

## 2012-08-18 DIAGNOSIS — E079 Disorder of thyroid, unspecified: Secondary | ICD-10-CM | POA: Insufficient documentation

## 2012-08-18 DIAGNOSIS — M79609 Pain in unspecified limb: Secondary | ICD-10-CM

## 2012-08-18 DIAGNOSIS — E039 Hypothyroidism, unspecified: Secondary | ICD-10-CM | POA: Insufficient documentation

## 2012-08-18 DIAGNOSIS — M25473 Effusion, unspecified ankle: Secondary | ICD-10-CM | POA: Insufficient documentation

## 2012-08-18 DIAGNOSIS — Z79899 Other long term (current) drug therapy: Secondary | ICD-10-CM | POA: Insufficient documentation

## 2012-08-18 DIAGNOSIS — Z8679 Personal history of other diseases of the circulatory system: Secondary | ICD-10-CM | POA: Insufficient documentation

## 2012-08-18 DIAGNOSIS — R209 Unspecified disturbances of skin sensation: Secondary | ICD-10-CM | POA: Insufficient documentation

## 2012-08-18 DIAGNOSIS — F319 Bipolar disorder, unspecified: Secondary | ICD-10-CM | POA: Insufficient documentation

## 2012-08-18 MED ORDER — SULFAMETHOXAZOLE-TRIMETHOPRIM 800-160 MG PO TABS: 1.0000 | ORAL_TABLET | Freq: Two times a day (BID) | ORAL | Status: DC

## 2012-08-18 NOTE — ED Notes (Signed)
Pt is here with right foot pain and denies injury.  Pulse present, no redness

## 2012-08-18 NOTE — ED Provider Notes (Signed)
History    CSN: [id number] Arrival date & time 08/18/12  1209  First MD Initiated Contact with Patient 08/18/12 1223     Chief Complaint  Patient presents with  . Foot Pain   (Consider location/radiation/quality/duration/timing/severity/associated sxs/prior Treatment) HPI Comments: Patient is a 67 year old female who presents for right foot pain with onset 2 days ago. She states the pain is burning in nature and radiates from the plantar aspect of her great toe along the dorsal surface of her right foot at the base of her subsequent toes and around the lateral dorsal aspect of her foot to her ankle. Patient states the pain is worse with palpation and weightbearing. She has tried Percocet without relief of symptoms. Patient admits to 2 hospitalizations within the past month as well as back surgery 4 weeks ago. Patient denies fever, pallor, numbness or tingling, and extremity weakness. Patient denies the use of blood thinners.  The history is provided by the patient. No language interpreter was used.   Past Medical History  Diagnosis Date  . Hypertension   . Bipolar 1 disorder   . Thyroid disease   . Back pain   . Hyperlipidemia   . Complication of anesthesia     hard to wake up anesthesia  . Hypothyroidism   . Depression   . GERD (gastroesophageal reflux disease)   . Headache(784.0)     migraines and tension headaches  . Arthritis    Past Surgical History  Procedure Laterality Date  . Appendectomy    . Gastric bypass    . Back surgery    . Tonsillectomy    . Wrist surgery    . Eye surgery Bilateral     lens implant  . Nasal sinus surgery Bilateral   . Lumbar laminectomy/decompression microdiscectomy Right 07/23/2012    Procedure: LUMBAR LAMINECTOMY/DECOMPRESSION MICRODISCECTOMY 2 LEVELS;  Surgeon: Karn Cassis, MD;  Location: [facility];  Service: Neurosurgery;  Laterality: Right;  Right Lumbar three-four  lumbar four-five Laminectomy/Foraminotomy   No family  history on file. History  Substance Use Topics  . Smoking status: Never Smoker   . Smokeless tobacco: Never Used  . Alcohol Use: Yes     Comment: social   OB History   Grav Para Term Preterm Abortions TAB SAB Ect Mult Living                 Review of Systems  Constitutional: Negative for fever.  Respiratory: Negative for shortness of breath.   Musculoskeletal: Positive for joint swelling and arthralgias.  Skin: Negative for pallor.  Neurological: Negative for weakness and numbness.  All other systems reviewed and are negative.    Allergies  Cefadroxil and Gabapentin  Home Medications   Current Outpatient Rx  Name  Route  Sig  Dispense  Refill  . Acetaminophen (TYLENOL ARTHRITIS PAIN PO)   Oral   Take 2 tablets by mouth 2 (two) times daily.         . Aspirin-Acetaminophen-Caffeine 260-130-16 MG TABS   Oral   Take 1 packet by mouth daily as needed. For headaches          . DULoxetine (CYMBALTA) 60 MG capsule   Oral   Take 60 mg by mouth daily.         [name] levothyroxine (SYNTHROID, LEVOTHROID) 50 MCG tablet   Oral   Take 50 mcg by mouth daily before breakfast.         . Multiple Vitamins-Minerals (MULTIVITAMIN WITH MINERALS) tablet  Oral   Take 1 tablet by mouth daily.         [name] omeprazole (PRILOSEC) 10 MG capsule   Oral   Take 10 mg by mouth 2 (two) times daily.          [name] oxyCODONE-acetaminophen (PERCOCET/ROXICET) 5-325 MG per tablet   Oral   Take 1 tablet by mouth every 4 (four) hours as needed for pain.         . pravastatin (PRAVACHOL) 10 MG tablet   Oral   Take 10 mg by mouth at bedtime.          . SUMAtriptan (IMITREX) 25 MG tablet   Oral   Take 25 mg by mouth every 2 (two) hours as needed for migraine.         . traZODone (DESYREL) 100 MG tablet   Oral   Take 100-300 mg by mouth at bedtime as needed and may repeat dose one time if needed for sleep.          [name] sulfamethoxazole-trimethoprim (SEPTRA DS) 800-160 MG per tablet    Oral   Take 1 tablet by mouth every 12 (twelve) hours.   20 tablet   0    BP 153/89  Pulse 97  Temp(Src) 98.2 F (36.8 C) (Oral)  Resp 20  Ht 5' 3.5" (1.613 m)  Wt 258 lb (117.028 kg)  BMI 44.98 kg/m2  SpO2 93% Physical Exam  Nursing note and vitals reviewed. Constitutional: She is oriented to person, place, and time. She appears well-developed and well-nourished. No distress.  HENT:  Head: Normocephalic and atraumatic.  Eyes: Conjunctivae and EOM are normal. No scleral icterus.  Neck: Normal range of motion.  Cardiovascular: Normal rate, regular rhythm and intact distal pulses.   Dorsalis pedis and posterior tibial pulses 2+ bilaterally. Capillary refill normal.  Pulmonary/Chest: Effort normal. No respiratory distress.  Musculoskeletal:       Right foot: She exhibits tenderness and swelling. She exhibits normal range of motion, no bony tenderness, normal capillary refill, no crepitus, no deformity and no laceration.       Feet:  Neurological: She is alert and oriented to person, place, and time.  No sensory or motor deficits appreciated. DTRs normal and symmetric.  Skin: Skin is warm and dry. No rash noted. She is not diaphoretic. There is erythema. No pallor.  Psychiatric: She has a normal mood and affect. Her behavior is normal.    ED Course  Procedures (including critical care time) Labs Reviewed - No data to display Dg Foot Complete Right  08/18/2012   *RADIOLOGY REPORT*  Clinical Data: Post-traumatic foot pain.  RIGHT FOOT COMPLETE - 3+ VIEW  Comparison: Foot radiographs 07/1935.  Findings: The mineralization and alignment are normal.  There is no evidence of acute fracture or dislocation.  There is stable calcaneal spurring.  No focal soft tissue swelling or significant arthropathic changes demonstrated.  IMPRESSION: Stable examination.  No acute osseous findings.   Original Report Authenticated By: Carey Bullocks, M.D.   VASCULAR LAB  PRELIMINARY PRELIMINARY  PRELIMINARY PRELIMINARY  Right lower extremity venous Doppler completed.  Preliminary report: There is no DVT or SVT noted in the right lower extremity.  KANADY, CANDACE, RVT  08/18/2012, 2:51 PM  1. Cellulitis    MDM  Patient presents for pain and swelling in her right foot. Patient recently hospitalized x2 and underwent surgery 4 weeks ago. Patient afebrile and well and nontoxic appearing; neurovascularly intact and physical exam findings as above. Given  history and physical exam will perform ultrasound of the right lower extremity to rule out DVT. X-ray obtained which shows no evidence of fracture, dislocation, or any deformity.  Venous Doppler without evidence of DVT in the right lower extremity. Patient appropriate for discharge with course of Bactrim for cellulitis. Patient with prescription for Percocet at home which she has been told to take for severe pain. Indications for ED return provided and patient agreeable to plan.  Antony Madura, PA-C 08/18/12 1750

## 2012-08-18 NOTE — Progress Notes (Signed)
VASCULAR LAB PRELIMINARY  PRELIMINARY  PRELIMINARY  PRELIMINARY  Right lower extremity venous Doppler completed.    Preliminary report:  There is no DVT or SVT noted in the right lower extremity.    Vaughn Beaumier, RVT 08/18/2012, 2:51 PM

## 2012-08-19 NOTE — ED Provider Notes (Signed)
Medical screening examination/treatment/procedure(s) were performed by non-physician practitioner and as supervising physician I was immediately available for consultation/collaboration.  Ashby Dawes, MD 08/19/12 [phone]

## 2012-08-20 LAB — CULTURE, BLOOD (ROUTINE X 2)
Culture: NO GROWTH
Culture: NO GROWTH

## 2012-08-22 ENCOUNTER — Emergency Department (HOSPITAL_COMMUNITY)
Admission: EM | Admit: 2012-08-22 | Discharge: 2012-08-22 | Disposition: A | Discharge status: Home / Self Care | Payer: Medicare Other | Attending: Emergency Medicine | Admitting: Emergency Medicine

## 2012-08-22 ENCOUNTER — Emergency Department (HOSPITAL_COMMUNITY): Payer: Medicare Other

## 2012-08-22 ENCOUNTER — Encounter (HOSPITAL_COMMUNITY): Payer: Self-pay | Admitting: *Deleted

## 2012-08-22 DIAGNOSIS — M79672 Pain in left foot: Secondary | ICD-10-CM

## 2012-08-22 DIAGNOSIS — M79609 Pain in unspecified limb: Secondary | ICD-10-CM | POA: Insufficient documentation

## 2012-08-22 DIAGNOSIS — F319 Bipolar disorder, unspecified: Secondary | ICD-10-CM | POA: Insufficient documentation

## 2012-08-22 DIAGNOSIS — R631 Polydipsia: Secondary | ICD-10-CM | POA: Insufficient documentation

## 2012-08-22 DIAGNOSIS — E039 Hypothyroidism, unspecified: Secondary | ICD-10-CM | POA: Insufficient documentation

## 2012-08-22 DIAGNOSIS — M7989 Other specified soft tissue disorders: Secondary | ICD-10-CM | POA: Insufficient documentation

## 2012-08-22 DIAGNOSIS — G43909 Migraine, unspecified, not intractable, without status migrainosus: Secondary | ICD-10-CM | POA: Insufficient documentation

## 2012-08-22 DIAGNOSIS — E785 Hyperlipidemia, unspecified: Secondary | ICD-10-CM | POA: Insufficient documentation

## 2012-08-22 DIAGNOSIS — M129 Arthropathy, unspecified: Secondary | ICD-10-CM | POA: Insufficient documentation

## 2012-08-22 DIAGNOSIS — Z79899 Other long term (current) drug therapy: Secondary | ICD-10-CM | POA: Insufficient documentation

## 2012-08-22 DIAGNOSIS — K219 Gastro-esophageal reflux disease without esophagitis: Secondary | ICD-10-CM | POA: Insufficient documentation

## 2012-08-22 DIAGNOSIS — I10 Essential (primary) hypertension: Secondary | ICD-10-CM | POA: Insufficient documentation

## 2012-08-22 LAB — POCT I-STAT, CHEM 8
BUN: 8 mg/dL (ref 6–23)
Calcium, Ion: 1.16 mmol/L (ref 1.13–1.30)
Chloride: 98 mEq/L (ref 96–112)
Creatinine, Ser: 1.2 mg/dL — ABNORMAL HIGH (ref 0.50–1.10)
Glucose, Bld: 129 mg/dL — ABNORMAL HIGH (ref 70–99)
HCT: 35 % — ABNORMAL LOW (ref 36.0–46.0)
Hemoglobin: 11.9 g/dL — ABNORMAL LOW (ref 12.0–15.0)
Potassium: 3.6 mEq/L (ref 3.5–5.1)
Sodium: 139 mEq/L (ref 135–145)
TCO2: 30 mmol/L (ref 0–100)

## 2012-08-22 MED ORDER — OXYCODONE-ACETAMINOPHEN 5-325 MG PO TABS
2.0000 | ORAL_TABLET | Freq: Once | ORAL | Status: AC
  Administered 2012-08-22: 2 via ORAL
  Filled 2012-08-22: qty 2

## 2012-08-22 MED ORDER — OXYCODONE-ACETAMINOPHEN 5-325 MG PO TABS: 1.0000 | ORAL_TABLET | ORAL | Status: DC | PRN

## 2012-08-22 NOTE — ED Provider Notes (Signed)
History    CSN: [id number] Arrival date & time 08/22/12  1150  First MD Initiated Contact with Patient 08/22/12 1422     Chief Complaint  Patient presents with  . Leg Swelling   (Consider location/radiation/quality/duration/timing/severity/associated sxs/prior Treatment) HPI Comments: Patient reports left foot and ankle pain that began this morning when she woke up and attempted to walk to the bathroom.  Pt has been treated this week for right foot cellulitis which she states is improving (ED visit 08/18/12, on bactrim and percocet).  Pt walks with a walker and states she is now able to bear weight on the right foot.  Denies fevers,chills, body aches, CP, SOB, abdominal pain, weakness or numbness of the lower extremities.  Pt has had back surgery 4 weeks ago and states she is doing well post-operatively, no complications.   The history is provided by the patient.   Past Medical History  Diagnosis Date  . Hypertension   . Bipolar 1 disorder   . Thyroid disease   . Back pain   . Hyperlipidemia   . Complication of anesthesia     hard to wake up anesthesia  . Hypothyroidism   . Depression   . GERD (gastroesophageal reflux disease)   . Headache(784.0)     migraines and tension headaches  . Arthritis    Past Surgical History  Procedure Laterality Date  . Appendectomy    . Gastric bypass    . Back surgery    . Tonsillectomy    . Wrist surgery    . Eye surgery Bilateral     lens implant  . Nasal sinus surgery Bilateral   . Lumbar laminectomy/decompression microdiscectomy Right 07/23/2012    Procedure: LUMBAR LAMINECTOMY/DECOMPRESSION MICRODISCECTOMY 2 LEVELS;  Surgeon: Karn Cassis, MD;  Location: [facility];  Service: Neurosurgery;  Laterality: Right;  Right Lumbar three-four  lumbar four-five Laminectomy/Foraminotomy   History reviewed. No pertinent family history. History  Substance Use Topics  . Smoking status: Never Smoker   . Smokeless tobacco: Never Used  .  Alcohol Use: Yes     Comment: social   OB History   Grav Para Term Preterm Abortions TAB SAB Ect Mult Living                 Review of Systems  Constitutional: Negative for fever and chills.  Respiratory: Negative for cough and shortness of breath.   Cardiovascular: Positive for leg swelling. Negative for chest pain.  Gastrointestinal: Negative for nausea, vomiting, abdominal pain and diarrhea.  Endocrine: Positive for polydipsia.  Genitourinary: Negative for dysuria and frequency.  Musculoskeletal: Negative for myalgias.  Neurological: Negative for weakness and numbness.    Allergies  Cefadroxil and Gabapentin  Home Medications   Current Outpatient Rx  Name  Route  Sig  Dispense  Refill  . Acetaminophen (TYLENOL ARTHRITIS PAIN PO)   Oral   Take 2 tablets by mouth 2 (two) times daily.         . Aspirin-Acetaminophen-Caffeine 260-130-16 MG TABS   Oral   Take 1 packet by mouth daily as needed. For headaches          . DULoxetine (CYMBALTA) 60 MG capsule   Oral   Take 60 mg by mouth daily.         [name] levothyroxine (SYNTHROID, LEVOTHROID) 50 MCG tablet   Oral   Take 50 mcg by mouth daily before breakfast.         . Multiple Vitamins-Minerals (MULTIVITAMIN WITH  MINERALS) tablet   Oral   Take 1 tablet by mouth daily.         [name] omeprazole (PRILOSEC) 10 MG capsule   Oral   Take 10 mg by mouth 2 (two) times daily.          [name] oxyCODONE-acetaminophen (PERCOCET/ROXICET) 5-325 MG per tablet   Oral   Take 1 tablet by mouth every 4 (four) hours as needed for pain.         . pravastatin (PRAVACHOL) 10 MG tablet   Oral   Take 10 mg by mouth at bedtime.          . sulfamethoxazole-trimethoprim (SEPTRA DS) 800-160 MG per tablet   Oral   Take 1 tablet by mouth every 12 (twelve) hours.   20 tablet   0   . traZODone (DESYREL) 100 MG tablet   Oral   Take 100-300 mg by mouth at bedtime as needed and may repeat dose one time if needed for sleep.          .  SUMAtriptan (IMITREX) 25 MG tablet   Oral   Take 25 mg by mouth every 2 (two) hours as needed for migraine.          BP 111/87  Pulse 54  Temp(Src) 99.3 F (37.4 C) (Oral)  Resp 18  SpO2 93% Physical Exam  Nursing note and vitals reviewed. Constitutional: She appears well-developed and well-nourished. No distress.  HENT:  Head: Normocephalic and atraumatic.  Neck: Neck supple.  Cardiovascular: Normal rate and regular rhythm.   Pulmonary/Chest: Effort normal and breath sounds normal. No respiratory distress. She has no wheezes. She has no rales.  Abdominal: Soft. She exhibits no distension. There is no tenderness. There is no rebound and no guarding.  obese  Musculoskeletal:  Surgical incision with sutures in place over lumbar spine.  No erythema, edema, tenderness, or warmth.  No discharge.   Right foot with area of pink discoloration, no tenderness or warmth.  Distal pulses intact, sensation intact.  Left foot without erythema, edema, warmth.  Dorsalis pedis pulses intact.  Moves all digits, sensation intact, capillary refill < 2 seconds.  Tenderness over plantar aspect of foot and over dorsal foot and anterior ankle.   No tenderness, edema, erythema superior to the left ankle.  Neurological: She is alert.  Skin: She is not diaphoretic.    ED Course  Procedures (including critical care time) Labs Reviewed  POCT I-STAT, CHEM 8 - Abnormal; Notable for the following:    Creatinine, Ser 1.20 (*)    Glucose, Bld 129 (*)    Hemoglobin 11.9 (*)    HCT 35.0 (*)    All other components within normal limits   Dg Ankle Complete Left  08/22/2012   *RADIOLOGY REPORT*  Clinical Data: Ankle pain/swelling  LEFT ANKLE COMPLETE - 3+ VIEW  Comparison: 11/12/2009  Findings: Old post-traumatic deformity of the medial malleolus.  The ankle mortise is intact.  Mild soft tissue swelling, more prominent medially.  IMPRESSION: No fracture or dislocation is seen.  Mild soft tissue swelling.    Original Report Authenticated By: Charline Bills, M.D.   Dg Foot Complete Left  08/22/2012   *RADIOLOGY REPORT*  Clinical Data: Left foot pain  LEFT FOOT - COMPLETE 3+ VIEW  Comparison: None.  Findings: No fracture or dislocation is seen.  The joint spaces are preserved.  Plantar calcaneal enthesophyte.  The visualized soft tissues are unremarkable.  IMPRESSION: No fracture or dislocation is seen.  Original Report Authenticated By: Charline Bills, M.D.    Discussed pt with Dr Ethelda Chick who has also seen the patient.    1. Left foot pain     MDM  Pt with known right foot cellulitis who walks with a walker p/w left foot pain.  Right foot cellulitis is improving.  Pt has no findings c/w cellulitis or DVT of the left foot/leg.  Likely painful given change in gait to compensate for pain in right foot.  Xrays negative.  Discussed all results with patient.  Pt given return precautions.  Pt verbalizes understanding and agrees with plan.     I doubt any other EMC precluding discharge at this time including, but not necessarily limited to the following: septic joint, cellulitis, dvt  Trixie Dredge, PA-C 08/22/12 2058

## 2012-08-22 NOTE — ED Notes (Signed)
Pt provided with fluids. Pt drank 1 cop of water. Denies nausea.

## 2012-08-22 NOTE — ED Notes (Signed)
Pt reports swelling/redness to right foot has improved. Redness noted to interior anterior aspect of right foot.

## 2012-08-22 NOTE — ED Notes (Signed)
Reports being seen on Monday for cellulitis to right foot and started on antibiotics.. Now having increase in redness and swelling to right foot/leg and spreading to left foot.

## 2012-08-22 NOTE — ED Provider Notes (Signed)
Complains of left foot pain since yesterday. No pain or swelling above the ankle. No other complaint. Pacemaker recently seen for saline his right foot which has improved steadily over time. But the pain is worse with weightbearing no pain when she doesn't apply pressure. On exam patient is chronically ill-appearing, skin appears pale left lower extremity no deformity no redness no swelling she is tender diffusely at her foot excluding toes. DP pulse 2+ right lower extremity without redness or tenderness neurovascularly intact  Doug Sou, MD 08/22/12 [phone]

## 2012-08-23 NOTE — ED Provider Notes (Signed)
Medical screening examination/treatment/procedure(s) were conducted as a shared visit with non-physician practitioner(s) and myself.  I personally evaluated the patient during the encounter  Doug Sou, MD 08/23/12 [phone]

## 2012-09-02 ENCOUNTER — Ambulatory Visit: Payer: Medicare Other

## 2012-09-08 ENCOUNTER — Ambulatory Visit: Payer: Medicare Other | Attending: Neurosurgery | Admitting: Physical Therapy

## 2012-09-08 DIAGNOSIS — IMO0001 Reserved for inherently not codable concepts without codable children: Secondary | ICD-10-CM | POA: Insufficient documentation

## 2012-09-08 DIAGNOSIS — R262 Difficulty in walking, not elsewhere classified: Secondary | ICD-10-CM | POA: Insufficient documentation

## 2012-09-08 DIAGNOSIS — R5381 Other malaise: Secondary | ICD-10-CM | POA: Insufficient documentation

## 2012-09-08 DIAGNOSIS — M545 Low back pain, unspecified: Secondary | ICD-10-CM | POA: Insufficient documentation

## 2012-09-10 ENCOUNTER — Ambulatory Visit: Payer: Medicare Other | Admitting: Rehabilitation

## 2012-09-15 ENCOUNTER — Ambulatory Visit: Payer: Medicare Other | Admitting: Physical Therapy

## 2012-09-18 ENCOUNTER — Ambulatory Visit: Payer: Medicare Other | Admitting: Physical Therapy

## 2012-09-22 ENCOUNTER — Ambulatory Visit: Payer: Medicare Other | Admitting: Physical Therapy

## 2012-09-25 ENCOUNTER — Ambulatory Visit: Payer: Medicare Other | Admitting: Physical Therapy

## 2012-09-29 ENCOUNTER — Ambulatory Visit: Payer: Medicare Other | Admitting: Physical Therapy

## 2012-10-01 ENCOUNTER — Ambulatory Visit: Payer: Medicare Other | Admitting: Physical Therapy

## 2012-10-07 ENCOUNTER — Ambulatory Visit: Payer: Medicare Other | Attending: Neurosurgery | Admitting: Physical Therapy

## 2012-10-07 DIAGNOSIS — M545 Low back pain, unspecified: Secondary | ICD-10-CM | POA: Insufficient documentation

## 2012-10-07 DIAGNOSIS — R5381 Other malaise: Secondary | ICD-10-CM | POA: Insufficient documentation

## 2012-10-07 DIAGNOSIS — IMO0001 Reserved for inherently not codable concepts without codable children: Secondary | ICD-10-CM | POA: Insufficient documentation

## 2012-10-07 DIAGNOSIS — R262 Difficulty in walking, not elsewhere classified: Secondary | ICD-10-CM | POA: Insufficient documentation

## 2012-10-09 ENCOUNTER — Ambulatory Visit: Payer: Medicare Other | Admitting: Physical Therapy

## 2012-10-14 ENCOUNTER — Ambulatory Visit: Payer: Medicare Other | Admitting: Rehabilitation

## 2012-10-21 ENCOUNTER — Ambulatory Visit: Payer: Medicare Other | Admitting: Physical Therapy

## 2012-10-22 ENCOUNTER — Ambulatory Visit: Payer: Medicare Other | Admitting: Physical Therapy

## 2012-10-28 ENCOUNTER — Ambulatory Visit: Payer: Medicare Other | Admitting: Physical Therapy

## 2012-10-30 ENCOUNTER — Encounter: Payer: Medicare Other | Admitting: Physical Therapy

## 2012-11-04 ENCOUNTER — Encounter: Payer: Medicare Other | Admitting: Physical Therapy

## 2012-11-06 ENCOUNTER — Encounter: Payer: Medicare Other | Admitting: Physical Therapy

## 2012-11-19 ENCOUNTER — Ambulatory Visit (INDEPENDENT_AMBULATORY_CARE_PROVIDER_SITE_OTHER): Payer: Medicare Other | Admitting: Ophthalmology

## 2012-12-28 ENCOUNTER — Observation Stay (HOSPITAL_COMMUNITY): Payer: Medicare Other

## 2012-12-28 ENCOUNTER — Observation Stay (HOSPITAL_COMMUNITY)
Admission: EM | Admit: 2012-12-28 | Discharge: 2012-12-30 | Disposition: A | Discharge status: Home / Self Care | Payer: Medicare Other | Attending: Internal Medicine | Admitting: Internal Medicine

## 2012-12-28 ENCOUNTER — Encounter (HOSPITAL_COMMUNITY): Payer: Self-pay | Admitting: Emergency Medicine

## 2012-12-28 DIAGNOSIS — F319 Bipolar disorder, unspecified: Secondary | ICD-10-CM | POA: Insufficient documentation

## 2012-12-28 DIAGNOSIS — E861 Hypovolemia: Secondary | ICD-10-CM | POA: Diagnosis present

## 2012-12-28 DIAGNOSIS — E785 Hyperlipidemia, unspecified: Secondary | ICD-10-CM | POA: Insufficient documentation

## 2012-12-28 DIAGNOSIS — R112 Nausea with vomiting, unspecified: Secondary | ICD-10-CM

## 2012-12-28 DIAGNOSIS — I10 Essential (primary) hypertension: Secondary | ICD-10-CM | POA: Diagnosis present

## 2012-12-28 DIAGNOSIS — E079 Disorder of thyroid, unspecified: Secondary | ICD-10-CM | POA: Insufficient documentation

## 2012-12-28 DIAGNOSIS — R197 Diarrhea, unspecified: Secondary | ICD-10-CM

## 2012-12-28 DIAGNOSIS — K219 Gastro-esophageal reflux disease without esophagitis: Secondary | ICD-10-CM | POA: Insufficient documentation

## 2012-12-28 DIAGNOSIS — E872 Acidosis: Secondary | ICD-10-CM

## 2012-12-28 DIAGNOSIS — M549 Dorsalgia, unspecified: Secondary | ICD-10-CM | POA: Insufficient documentation

## 2012-12-28 DIAGNOSIS — N179 Acute kidney failure, unspecified: Secondary | ICD-10-CM

## 2012-12-28 DIAGNOSIS — K5289 Other specified noninfective gastroenteritis and colitis: Principal | ICD-10-CM | POA: Insufficient documentation

## 2012-12-28 DIAGNOSIS — K529 Noninfective gastroenteritis and colitis, unspecified: Secondary | ICD-10-CM | POA: Diagnosis present

## 2012-12-28 DIAGNOSIS — R7989 Other specified abnormal findings of blood chemistry: Secondary | ICD-10-CM

## 2012-12-28 DIAGNOSIS — E039 Hypothyroidism, unspecified: Secondary | ICD-10-CM | POA: Diagnosis present

## 2012-12-28 LAB — COMPREHENSIVE METABOLIC PANEL
ALT: 16 U/L (ref 0–35)
AST: 31 U/L (ref 0–37)
Albumin: 4 g/dL (ref 3.5–5.2)
Alkaline Phosphatase: 96 U/L (ref 39–117)
BUN: 16 mg/dL (ref 6–23)
CO2: 24 mEq/L (ref 19–32)
Calcium: 9.1 mg/dL (ref 8.4–10.5)
Chloride: 98 mEq/L (ref 96–112)
Creatinine, Ser: 0.82 mg/dL (ref 0.50–1.10)
GFR calc Af Amer: 85 mL/min — ABNORMAL LOW (ref >90)
GFR calc non Af Amer: 73 mL/min — ABNORMAL LOW (ref >90)
Glucose, Bld: 133 mg/dL — ABNORMAL HIGH (ref 70–99)
Potassium: 4.6 mEq/L (ref 3.5–5.1)
Sodium: 137 mEq/L (ref 135–145)
Total Bilirubin: 0.4 mg/dL (ref 0.3–1.2)
Total Protein: 7.2 g/dL (ref 6.0–8.3)

## 2012-12-28 LAB — CBC WITH DIFFERENTIAL/PLATELET
Basophils Absolute: 0 10*3/uL (ref 0.0–0.1)
Basophils Relative: 0 % (ref 0–1)
Eosinophils Absolute: 0 10*3/uL (ref 0.0–0.7)
Eosinophils Relative: 0 % (ref 0–5)
HCT: 43.4 % (ref 36.0–46.0)
Hemoglobin: 14.4 g/dL (ref 12.0–15.0)
Lymphocytes Relative: 4 % — ABNORMAL LOW (ref 12–46)
Lymphs Abs: 0.5 10*3/uL — ABNORMAL LOW (ref 0.7–4.0)
MCH: 32.2 pg (ref 26.0–34.0)
MCHC: 33.2 g/dL (ref 30.0–36.0)
MCV: 97.1 fL (ref 78.0–100.0)
Monocytes Absolute: 0.5 10*3/uL (ref 0.1–1.0)
Monocytes Relative: 5 % (ref 3–12)
Neutro Abs: 9.6 10*3/uL — ABNORMAL HIGH (ref 1.7–7.7)
Neutrophils Relative %: 90 % — ABNORMAL HIGH (ref 43–77)
Platelets: 204 10*3/uL (ref 150–400)
RBC: 4.47 MIL/uL (ref 3.87–5.11)
RDW: 14.6 % (ref 11.5–15.5)
WBC: 10.7 10*3/uL — ABNORMAL HIGH (ref 4.0–10.5)

## 2012-12-28 LAB — CG4 I-STAT (LACTIC ACID)
Lactic Acid, Venous: 4.22 mmol/L — ABNORMAL HIGH (ref 0.5–2.2)
Lactic Acid, Venous: 2.34 mmol/L — ABNORMAL HIGH (ref 0.5–2.2)

## 2012-12-28 MED ORDER — SODIUM CHLORIDE 0.9 % IV SOLN
INTRAVENOUS | Status: DC
  Administered 2012-12-29: via INTRAVENOUS
  Administered 2012-12-29: 75 mL/h via INTRAVENOUS
  Administered 2012-12-29: 22:00:00 via INTRAVENOUS

## 2012-12-28 MED ORDER — ONDANSETRON HCL 4 MG/2ML IJ SOLN
4.0000 mg | Freq: Once | INTRAMUSCULAR | Status: AC
  Administered 2012-12-28: 4 mg via INTRAVENOUS
  Filled 2012-12-28: qty 2

## 2012-12-28 MED ORDER — SODIUM CHLORIDE 0.9 % IV SOLN
1000.0000 mL | Freq: Once | INTRAVENOUS | Status: AC
  Administered 2012-12-28: 1000 mL via INTRAVENOUS

## 2012-12-28 MED ORDER — TRAZODONE HCL 150 MG PO TABS
300.0000 mg | ORAL_TABLET | Freq: Every day | ORAL | Status: DC
Start: 2012-12-28 — End: 2012-12-30
  Administered 2012-12-29: 300 mg via ORAL
  Filled 2012-12-28 (×4): qty 2

## 2012-12-28 MED ORDER — LEVOTHYROXINE SODIUM 50 MCG PO TABS
50.0000 ug | ORAL_TABLET | Freq: Every day | ORAL | Status: DC
  Administered 2012-12-29 – 2012-12-30 (×2): 50 ug via ORAL
  Filled 2012-12-28 (×3): qty 1

## 2012-12-28 MED ORDER — SODIUM CHLORIDE 0.9 % IJ SOLN: 3.0000 mL | Freq: Two times a day (BID) | INTRAMUSCULAR | Status: DC

## 2012-12-28 MED ORDER — ENOXAPARIN SODIUM 30 MG/0.3ML ~~LOC~~ SOLN
30.0000 mg | SUBCUTANEOUS | Status: DC
  Administered 2012-12-29: 30 mg via SUBCUTANEOUS
  Filled 2012-12-28 (×2): qty 0.3

## 2012-12-28 MED ORDER — ONDANSETRON HCL 4 MG/2ML IJ SOLN
4.0000 mg | Freq: Four times a day (QID) | INTRAMUSCULAR | Status: DC | PRN
  Administered 2012-12-29 (×3): 4 mg via INTRAVENOUS
  Filled 2012-12-28 (×3): qty 2

## 2012-12-28 MED ORDER — HYDROCODONE-ACETAMINOPHEN 5-325 MG PO TABS
1.0000 | ORAL_TABLET | Freq: Four times a day (QID) | ORAL | Status: DC | PRN
  Administered 2012-12-29 (×3): 1 via ORAL
  Filled 2012-12-28 (×3): qty 1

## 2012-12-28 MED ORDER — PANTOPRAZOLE SODIUM 40 MG IV SOLR
40.0000 mg | INTRAVENOUS | Status: DC
  Administered 2012-12-29: 40 mg via INTRAVENOUS
  Filled 2012-12-28 (×2): qty 40

## 2012-12-28 MED ORDER — DULOXETINE HCL 30 MG PO CPEP: 30.0000 mg | ORAL_CAPSULE | Freq: Every day | ORAL | Status: DC

## 2012-12-28 MED ORDER — LOPERAMIDE HCL 2 MG PO CAPS
4.0000 mg | ORAL_CAPSULE | Freq: Once | ORAL | Status: AC
  Administered 2012-12-28: 4 mg via ORAL
  Filled 2012-12-28: qty 2

## 2012-12-28 MED ORDER — ONDANSETRON HCL 4 MG/2ML IJ SOLN: 4.0000 mg | Freq: Three times a day (TID) | INTRAMUSCULAR | Status: DC | PRN

## 2012-12-28 MED ORDER — SODIUM CHLORIDE 0.9 % IV SOLN
1000.0000 mL | INTRAVENOUS | Status: DC
  Administered 2012-12-28: 1000 mL via INTRAVENOUS

## 2012-12-28 MED ORDER — ACETAMINOPHEN 325 MG PO TABS
650.0000 mg | ORAL_TABLET | Freq: Once | ORAL | Status: AC
  Administered 2012-12-28: 650 mg via ORAL
  Filled 2012-12-28: qty 2

## 2012-12-28 MED ORDER — SODIUM CHLORIDE 0.9 % IV BOLUS (SEPSIS)
1000.0000 mL | Freq: Once | INTRAVENOUS | Status: AC
  Administered 2012-12-28: 1000 mL via INTRAVENOUS

## 2012-12-28 MED ORDER — DULOXETINE HCL 60 MG PO CPEP
90.0000 mg | ORAL_CAPSULE | Freq: Every day | ORAL | Status: DC
  Filled 2012-12-28 (×2): qty 1

## 2012-12-28 MED ORDER — DULOXETINE HCL 60 MG PO CPEP: 60.0000 mg | ORAL_CAPSULE | Freq: Every day | ORAL | Status: DC

## 2012-12-28 NOTE — ED Provider Notes (Addendum)
CSN: [id number]     Arrival date & time 12/28/12  1636 History   First MD Initiated Contact with Patient 12/28/12 1744     Chief Complaint  Patient presents with  . Diarrhea  . Emesis   (Consider location/radiation/quality/duration/timing/severity/associated sxs/prior Treatment) Patient is a 67 y.o. female presenting with diarrhea and vomiting. The history is provided by the patient.  Diarrhea Associated symptoms: vomiting   Emesis Associated symptoms: diarrhea   She had onset of nausea and vomiting at 7 AM, and diarrhea at 2 PM. Symptoms are severe. She's not been able to hold anything down. She has had chills but no fever or sweats. There has been no blood in stool or emesis. She denies abdominal pain. She has had sick contact with similar illness. She has not been able to treat herself with anything.  Past Medical History  Diagnosis Date  . Hypertension   . Bipolar 1 disorder   . Thyroid disease   . Back pain   . Hyperlipidemia   . Complication of anesthesia     hard to wake up anesthesia  . Hypothyroidism   . Depression   . GERD (gastroesophageal reflux disease)   . Headache(784.0)     migraines and tension headaches  . Arthritis    Past Surgical History  Procedure Laterality Date  . Appendectomy    . Gastric bypass    . Back surgery    . Tonsillectomy    . Wrist surgery    . Eye surgery Bilateral     lens implant  . Nasal sinus surgery Bilateral   . Lumbar laminectomy/decompression microdiscectomy Right 07/23/2012    Procedure: LUMBAR LAMINECTOMY/DECOMPRESSION MICRODISCECTOMY 2 LEVELS;  Surgeon: Karn Cassis, MD;  Location: [facility];  Service: Neurosurgery;  Laterality: Right;  Right Lumbar three-four  lumbar four-five Laminectomy/Foraminotomy   History reviewed. No pertinent family history. History  Substance Use Topics  . Smoking status: Never Smoker   . Smokeless tobacco: Never Used  . Alcohol Use: Yes     Comment: social   OB History   Grav Para  Term Preterm Abortions TAB SAB Ect Mult Living                 Review of Systems  Gastrointestinal: Positive for vomiting and diarrhea.  All other systems reviewed and are negative.    Allergies  Cefadroxil and Gabapentin  Home Medications   Current Outpatient Rx  Name  Route  Sig  Dispense  Refill  . Acetaminophen (TYLENOL ARTHRITIS PAIN PO)   Oral   Take 2 tablets by mouth 2 (two) times daily.         . Aspirin-Acetaminophen-Caffeine 260-130-16 MG TABS   Oral   Take 1 packet by mouth daily as needed. For headaches          . DULoxetine (CYMBALTA) 60 MG capsule   Oral   Take 60 mg by mouth daily.         [name] levothyroxine (SYNTHROID, LEVOTHROID) 50 MCG tablet   Oral   Take 50 mcg by mouth daily before breakfast.         . Multiple Vitamins-Minerals (MULTIVITAMIN WITH MINERALS) tablet   Oral   Take 1 tablet by mouth daily.         [name] omeprazole (PRILOSEC) 10 MG capsule   Oral   Take 10 mg by mouth 2 (two) times daily.          [name] oxyCODONE-acetaminophen (PERCOCET) 5-325 MG per  tablet   Oral   Take 1 tablet by mouth every 4 (four) hours as needed for pain.   10 tablet   0   . oxyCODONE-acetaminophen (PERCOCET/ROXICET) 5-325 MG per tablet   Oral   Take 1 tablet by mouth every 4 (four) hours as needed for pain.         . pravastatin (PRAVACHOL) 10 MG tablet   Oral   Take 10 mg by mouth at bedtime.          . sulfamethoxazole-trimethoprim (SEPTRA DS) 800-160 MG per tablet   Oral   Take 1 tablet by mouth every 12 (twelve) hours.   20 tablet   0   . SUMAtriptan (IMITREX) 25 MG tablet   Oral   Take 25 mg by mouth every 2 (two) hours as needed for migraine.         . traZODone (DESYREL) 100 MG tablet   Oral   Take 100-300 mg by mouth at bedtime as needed and may repeat dose one time if needed for sleep.           BP 153/72  Pulse 110  Temp(Src) 98 F (36.7 C)  Resp 22  Wt 245 lb (111.131 kg)  SpO2 97% Physical Exam  Nursing note  and vitals reviewed.  67 year old female, resting comfortably and in no acute distress. Vital signs are significant for tachycardia with heart rate 110, no tachypnea with history rate of 22, and hypertension with blood pressure 153/72. Oxygen saturation is 97%, which is normal. Head is normocephalic and atraumatic. PERRLA, EOMI. Oropharynx is clear. Neck is nontender and supple without adenopathy or JVD. Back is nontender and there is no CVA tenderness. Lungs are clear without rales, wheezes, or rhonchi. Chest is nontender. Heart has regular rate and rhythm without murmur. Abdomen is soft, flat, nontender without masses or hepatosplenomegaly and peristalsis is hypoactive. Extremities have no cyanosis or edema, full range of motion is present. Skin is warm and dry without rash. Neurologic: Mental status is normal, cranial nerves are intact, there are no motor or sensory deficits.  ED Course  Procedures (including critical care time) Labs Review Results for orders placed during the hospital encounter of 12/28/12  CBC WITH DIFFERENTIAL      Result Value Range   WBC 10.7 (*) 4.0 - 10.5 K/uL   RBC 4.47  3.87 - 5.11 MIL/uL   Hemoglobin 14.4  12.0 - 15.0 g/dL   HCT 40.9  81.1 - 91.4 %   MCV 97.1  78.0 - 100.0 fL   MCH 32.2  26.0 - 34.0 pg   MCHC 33.2  30.0 - 36.0 g/dL   RDW 78.2  95.6 - 21.3 %   Platelets 204  150 - 400 K/uL   Neutrophils Relative % 90 (*) 43 - 77 %   Neutro Abs 9.6 (*) 1.7 - 7.7 K/uL   Lymphocytes Relative 4 (*) 12 - 46 %   Lymphs Abs 0.5 (*) 0.7 - 4.0 K/uL   Monocytes Relative 5  3 - 12 %   Monocytes Absolute 0.5  0.1 - 1.0 K/uL   Eosinophils Relative 0  0 - 5 %   Eosinophils Absolute 0.0  0.0 - 0.7 K/uL   Basophils Relative 0  0 - 1 %   Basophils Absolute 0.0  0.0 - 0.1 K/uL  COMPREHENSIVE METABOLIC PANEL      Result Value Range   Sodium 137  135 - 145 mEq/L   Potassium 4.6  3.5 - 5.1 mEq/L   Chloride 98  96 - 112 mEq/L   CO2 24  19 - 32 mEq/L   Glucose, Bld  133 (*) 70 - 99 mg/dL   BUN 16  6 - 23 mg/dL   Creatinine, Ser 8.29  0.50 - 1.10 mg/dL   Calcium 9.1  8.4 - 56.2 mg/dL   Total Protein 7.2  6.0 - 8.3 g/dL   Albumin 4.0  3.5 - 5.2 g/dL   AST 31  0 - 37 U/L   ALT 16  0 - 35 U/L   Alkaline Phosphatase 96  39 - 117 U/L   Total Bilirubin 0.4  0.3 - 1.2 mg/dL   GFR calc non Af Amer 73 (*) >90 mL/min   GFR calc Af Amer 85 (*) >90 mL/min  CG4 I-STAT (LACTIC ACID)      Result Value Range   Lactic Acid, Venous 4.22 (*) 0.5 - 2.2 mmol/L  CG4 I-STAT (LACTIC ACID)      Result Value Range   Lactic Acid, Venous 2.34 (*) 0.5 - 2.2 mmol/L   MDM   1. Nausea vomiting and diarrhea   2. Elevated lactic acid level    Vomiting and diarrhea most consistent with viral gastroenteritis. She will be given IV fluids, IV ondansetron, and oral loperamide.  After the above noted treatment, nausea is improved and she no longer feels an urge to have diarrhea, but she still states he feels very weak and does not feel like she can go home. Lactic acid came back elevated but after IV hydration, has come down considerably. It is now only minimally elevated. Case is discussed with Dr. Allena Katz of [facility] agrees to admit her under observation status.  Dione Booze, MD 12/28/12 1308  Dione Booze, MD 12/28/12 2149

## 2012-12-28 NOTE — ED Notes (Signed)
Upon entering room, pt actively vomiting and having diarrhea.

## 2012-12-28 NOTE — ED Notes (Signed)
Presents with nausea and vomiting since 7 am every 30 minutes and diarrhea since 2 pm 3 times, described as water. Denies pain reports weakness and fatigue, denies fever, denies hematemesis and blood in stool.

## 2012-12-28 NOTE — ED Notes (Signed)
MD at bedside. 

## 2012-12-28 NOTE — Progress Notes (Signed)
Pt admitted to the unit. Pt is alert and oriented. Pt oriented to room, staff, and call bell. Educated on fall safety plan. Bed in lowest position. Full assessment to Epic. Call bell with in reach. Educated to call for assists. Will continue to monitor. Layza Summa, RN 

## 2012-12-28 NOTE — ED Notes (Signed)
Admitting physician at bedside

## 2012-12-28 NOTE — H&P (Signed)
[facility] History and Physical  Patient: Lindsey Rowe  ZOX:[id number]  DOB: 30-Sep-1945  DOS: the patient was seen and examined on 12/28/2012 PCP: Astrid Divine, MD  Chief Complaint: Nausea and vomiting  HPI: [name] is a 67 y.o. female with Past medical history of hypertension, hypothyroidism, GERD. The patient is coming from home. The patient is presenting with the complaint of nausea and vomiting that started earlier this morning she mentions she has been feeling nausea did throughout the day and initially had 2-4 vomiting episode without any blood and then she had dry having and retching throughout the day. Around often on time she started having diarrhea which were loose watery without any blood. She complains of generalized abdominal soreness but denies any deep abdominal pain or crampiness or burning abdominal pain. She comes of dizziness and severe lethargy. She denied any sensation of fever at home but was found febrile in the ED. She denies any outside food or change in her medication or diet. 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 She denies any similar episode in the past. Currently she complains of upper abdominal pain due to retching, mild shortness of breath due to pain and lethargy. She denies any chest pain, cough, fever, focal neurological deficit.  Review of Systems: as mentioned in the history of present illness.  A Comprehensive review of the other systems is negative.  Past Medical History  Diagnosis Date  . Hypertension   . Bipolar 1 disorder   . Thyroid disease   . Back pain   . Hyperlipidemia   . Complication of anesthesia     hard to wake up anesthesia  . Hypothyroidism   . Depression   . GERD (gastroesophageal reflux disease)   . Headache(784.0)     migraines and tension headaches  . Arthritis    Past Surgical History  Procedure Laterality Date   . Appendectomy    . Gastric bypass    . Back surgery    . Tonsillectomy    . Wrist surgery    . Eye surgery Bilateral     lens implant  . Nasal sinus surgery Bilateral   . Lumbar laminectomy/decompression microdiscectomy Right 07/23/2012    Procedure: LUMBAR LAMINECTOMY/DECOMPRESSION MICRODISCECTOMY 2 LEVELS;  Surgeon: Karn Cassis, MD;  Location: [facility];  Service: Neurosurgery;  Laterality: Right;  Right Lumbar three-four  lumbar four-five Laminectomy/Foraminotomy   Social History:  reports that she has never smoked. She has never used smokeless tobacco. She reports that she drinks alcohol. She reports that she does not use illicit drugs. Independent for most of her  ADL.  Allergies  Allergen Reactions  . Gabapentin Other (See Comments)    Right foot and leg swelled   . Cefadroxil Itching    Ends of hair itched     History reviewed. No pertinent family history.  Prior to Admission medications   Medication Sig Start Date End Date Taking? Authorizing Provider  Acetaminophen (TYLENOL ARTHRITIS PAIN PO) Take 2 tablets by mouth 2 (two) times daily.   Yes Historical Provider, MD  DULoxetine (CYMBALTA) 30 MG capsule Take 30 mg by mouth daily. Takes total of 90mg  once daily   Yes Historical Provider, MD  DULoxetine (CYMBALTA) 60 MG capsule Take 60 mg by mouth daily. Takes total of 90mg  once daily   Yes Historical Provider, MD  fexofenadine (MUCINEX ALLERGY) 180 MG tablet Take  180 mg by mouth daily as needed for allergies or rhinitis.   Yes Historical Provider, MD  HYDROcodone-acetaminophen (NORCO/VICODIN) 5-325 MG per tablet Take 1 tablet by mouth every 6 (six) hours as needed for moderate pain.   Yes Historical Provider, MD  influenza vac split quadrivalent PF (FLUARIX) 0.5 ML injection Inject 0.5 mLs into the muscle once.   Yes Historical Provider, MD  levothyroxine (SYNTHROID, LEVOTHROID) 50 MCG tablet Take 50 mcg by mouth daily before breakfast.   Yes Historical Provider, MD   lisinopril (PRINIVIL,ZESTRIL) 20 MG tablet Take 20 mg by mouth daily.   Yes Historical Provider, MD  Multiple Vitamins-Minerals (MULTIVITAMIN WITH MINERALS) tablet Take 1 tablet by mouth daily.   Yes Historical Provider, MD  omeprazole (PRILOSEC) 10 MG capsule Take 10 mg by mouth 2 (two) times daily.    Yes Historical Provider, MD  pravastatin (PRAVACHOL) 10 MG tablet Take 10 mg by mouth at bedtime.    Yes Historical Provider, MD  traZODone (DESYREL) 100 MG tablet Take 300 mg by mouth at bedtime.    Yes Historical Provider, MD  SUMAtriptan (IMITREX) 25 MG tablet Take 25 mg by mouth every 2 (two) hours as needed for migraine.    Historical Provider, MD    Physical Exam: Filed Vitals:   12/28/12 2030 12/28/12 2045 12/28/12 2100 12/28/12 2115  BP: 100/61 109/58 117/65 111/60  Pulse: 99 112 105 103  Temp:      TempSrc:      Resp: 16 18 17 18   Weight:      SpO2: 96% 95% 95% 96%    General: Alert, Awake and Oriented to Time, Place and Person. Appear in mild distress Eyes: PERRL ENT: Oral Mucosa clear dry. Neck: No  JVD Cardiovascular: S1 and S2 Present, no  Murmur, Peripheral Pulses Present Respiratory: Bilateral Air entry equal and Decreased, Clear to Auscultation,  No  Crackles,no  wheezes Abdomen: Bowel Sound Present, Soft and diffusely superficially tender Skin: No  Rash Extremities: No  Pedal edema, no  calf tenderness Neurologic: Grossly Unremarkable.  Labs on Admission:  CBC:  Recent Labs Lab 12/28/12 1645  WBC 10.7*  NEUTROABS 9.6*  HGB 14.4  HCT 43.4  MCV 97.1  PLT 204    CMP     Component Value Date/Time   NA 137 12/28/2012 1645   K 4.6 12/28/2012 1645   CL 98 12/28/2012 1645   CO2 24 12/28/2012 1645   GLUCOSE 133* 12/28/2012 1645   BUN 16 12/28/2012 1645   CREATININE 0.82 12/28/2012 1645   CALCIUM 9.1 12/28/2012 1645   PROT 7.2 12/28/2012 1645   ALBUMIN 4.0 12/28/2012 1645   AST 31 12/28/2012 1645   ALT 16 12/28/2012 1645   ALKPHOS 96 12/28/2012  1645   BILITOT 0.4 12/28/2012 1645   GFRNONAA 73* 12/28/2012 1645   GFRAA 85* 12/28/2012 1645   Radiological Exams on Admission: Dg Abd Acute W/chest  12/28/2012   CLINICAL DATA:  Nausea, vomiting, and diarrhea. Lower chest pain. There is a history of gastric bypass in 1981.  EXAM: ACUTE ABDOMEN SERIES (ABDOMEN 2 VIEW & CHEST 1 VIEW)  COMPARISON:  Chest 08/13/2012  FINDINGS: Normal heart size and pulmonary vascularity. No focal airspace disease in the lungs. No blunting of costophrenic angles. No significant change since prior study.  Surgical clips in the left upper quadrant. Scattered gas and stool in the colon. No small or large bowel distention. No free intra-abdominal air. No abnormal air-fluid levels. Degenerative changes in the spine  and hips.  IMPRESSION: No evidence of active pulmonary disease. Nonobstructive bowel gas pattern.   Electronically Signed   By: Burman Nieves M.D.   On: 12/28/2012 23:08    Assessment/Plan Principal Problem:   Acute gastroenteritis Active Problems:   HYPERTENSION, BENIGN ESSENTIAL   Hypothyroid   Hypovolemia   1. Acute gastroenteritis The patient is presenting with acute onset of nausea and vomiting followed by diarrhea without any alarming signs. her symptoms has improved but she continues to remain weak and lethargic in the Ed despite adequate hydration. She had lactic acidosis on arrival which also has improved with IV fluids. At present I would admit her to the observation unit to continue on the resuscitation. she will be getting IV fluids. IV Zofran and IV Protonix will be given as needed. After checking a C. difficile PCR I would also place her on Imodium as needed it is negative. I would check an x-ray of the upper abdomen and chest as she is complaining of shortness of breath and upper abdominal pain. I would also check lipase and amylase in a.m.   2. Hypothyroidism Continue Synthroid  3. Hypertension Holding antihypertensives at  present  4.Mood disorder   continue Cymbalta and trazodone  DVT Prophylaxis: subcutaneous Heparin Nutrition: As tolerated   Code Status: Full   Disposition: Admitted to observation in telemetry unit.  Author: Lynden Oxford, MD [facility] Pager: [phone] 12/28/2012, 10:26 PM    If 7PM-7AM, please contact night-coverage [email or web address] Password [email or web address]

## 2012-12-29 ENCOUNTER — Encounter (HOSPITAL_COMMUNITY): Payer: Self-pay | Admitting: *Deleted

## 2012-12-29 DIAGNOSIS — N179 Acute kidney failure, unspecified: Secondary | ICD-10-CM

## 2012-12-29 LAB — COMPREHENSIVE METABOLIC PANEL
ALT: 13 U/L (ref 0–35)
AST: 28 U/L (ref 0–37)
Albumin: 3.1 g/dL — ABNORMAL LOW (ref 3.5–5.2)
Alkaline Phosphatase: 73 U/L (ref 39–117)
BUN: 14 mg/dL (ref 6–23)
CO2: 25 mEq/L (ref 19–32)
Calcium: 7.9 mg/dL — ABNORMAL LOW (ref 8.4–10.5)
Chloride: 105 mEq/L (ref 96–112)
Creatinine, Ser: 0.8 mg/dL (ref 0.50–1.10)
GFR calc Af Amer: 87 mL/min — ABNORMAL LOW (ref >90)
GFR calc non Af Amer: 75 mL/min — ABNORMAL LOW (ref >90)
Glucose, Bld: 90 mg/dL (ref 70–99)
Potassium: 4.1 mEq/L (ref 3.5–5.1)
Sodium: 141 mEq/L (ref 135–145)
Total Bilirubin: 0.3 mg/dL (ref 0.3–1.2)
Total Protein: 5.5 g/dL — ABNORMAL LOW (ref 6.0–8.3)

## 2012-12-29 LAB — CBC
HCT: 35.3 % — ABNORMAL LOW (ref 36.0–46.0)
Hemoglobin: 11.9 g/dL — ABNORMAL LOW (ref 12.0–15.0)
MCH: 32.3 pg (ref 26.0–34.0)
MCHC: 33.7 g/dL (ref 30.0–36.0)
MCV: 95.9 fL (ref 78.0–100.0)
Platelets: 161 10*3/uL (ref 150–400)
RBC: 3.68 MIL/uL — ABNORMAL LOW (ref 3.87–5.11)
RDW: 14.6 % (ref 11.5–15.5)
WBC: 5.3 10*3/uL (ref 4.0–10.5)

## 2012-12-29 LAB — PROTIME-INR
INR: 0.99 (ref 0.00–1.49)
Prothrombin Time: 12.9 seconds (ref 11.6–15.2)

## 2012-12-29 LAB — CLOSTRIDIUM DIFFICILE BY PCR: Toxigenic C. Difficile by PCR: NEGATIVE

## 2012-12-29 MED ORDER — PANTOPRAZOLE SODIUM 40 MG PO TBEC
40.0000 mg | DELAYED_RELEASE_TABLET | Freq: Every day | ORAL | Status: DC
  Administered 2012-12-29: 40 mg via ORAL

## 2012-12-29 NOTE — Progress Notes (Signed)
[facility] PROGRESS NOTE  GRACELAND WACHTER ZOX:[id number] DOB: 1945-03-11 DOA: 12/28/2012 PCP: Astrid Divine, MD  Assessment/Plan:  1. Acute gastroenteritis  -suspect Viral , Cdiff PCr negative -improving -continue IVf, advance diet -supportive care  2. Hypothyroidism  Continue Synthroid   3. Hypertension  Holding antihypertensives at present   4.Mood disorder  continue Cymbalta and trazodone   DVT Prophylaxis: subcutaneous Heparin    Code Status: Full  Disposition: home tomorrow        HPI/Subjective: Feels better, still with nausea, vomiting and diarrhea improved  Objective: Filed Vitals:   12/29/12 1414  BP: 129/81  Pulse: 84  Temp: 98.1 F (36.7 C)  Resp: 18    Intake/Output Summary (Last 24 hours) at 12/29/12 1634 Last data filed at 12/29/12 1417  Gross per 24 hour  Intake    840 ml  Output      0 ml  Net    840 ml   Filed Weights   12/28/12 1642 12/28/12 2321  Weight: 111.131 kg (245 lb) 110.542 kg (243 lb 11.2 oz)    Exam:  General: Alert, Awake and Oriented to Time, Place and Person. Appear in mild distress  Eyes: PERRL  ENT: Oral Mucosa clear dry.  Neck: No JVD  Cardiovascular: S1 and S2 Present, no Murmur, Peripheral Pulses Present  Respiratory: Bilateral Air entry equal and Decreased, Clear to Auscultation,  No Crackles,no wheezes  Abdomen: Bowel Sound Present, Soft and diffusely superficially tender  Skin: No Rash  Extremities: No Pedal edema, no calf tenderness  Neurologic: Grossly Unremarkable   Data Reviewed: Basic Metabolic Panel:  Recent Labs Lab 12/28/12 1645 12/29/12 0535  NA 137 141  K 4.6 4.1  CL 98 105  CO2 24 25  GLUCOSE 133* 90  BUN 16 14  CREATININE 0.82 0.80  CALCIUM 9.1 7.9*   Liver Function Tests:  Recent Labs Lab 12/28/12 1645 12/29/12 0535  AST 31 28  ALT 16 13  ALKPHOS 96 73  BILITOT 0.4 0.3  PROT 7.2 5.5*  ALBUMIN 4.0 3.1*   No results found for this basename: LIPASE,  AMYLASE,  in the last 168 hours No results found for this basename: AMMONIA,  in the last 168 hours CBC:  Recent Labs Lab 12/28/12 1645 12/29/12 0535  WBC 10.7* 5.3  NEUTROABS 9.6*  --   HGB 14.4 11.9*  HCT 43.4 35.3*  MCV 97.1 95.9  PLT 204 161   Cardiac Enzymes: No results found for this basename: CKTOTAL, CKMB, CKMBINDEX, TROPONINI,  in the last 168 hours BNP (last 3 results) No results found for this basename: PROBNP,  in the last 8760 hours CBG: No results found for this basename: GLUCAP,  in the last 168 hours  Recent Results (from the past 240 hour(s))  CLOSTRIDIUM DIFFICILE BY PCR     Status: None   Collection Time    12/29/12 12:59 AM      Result Value Range Status   C difficile by pcr NEGATIVE  NEGATIVE Final     Studies: Dg Abd Acute W/chest  12/28/2012   CLINICAL DATA:  Nausea, vomiting, and diarrhea. Lower chest pain. There is a history of gastric bypass in 1981.  EXAM: ACUTE ABDOMEN SERIES (ABDOMEN 2 VIEW & CHEST 1 VIEW)  COMPARISON:  Chest 08/13/2012  FINDINGS: Normal heart size and pulmonary vascularity. No focal airspace disease in the lungs. No blunting of costophrenic angles. No significant change since prior study.  Surgical clips in the left upper quadrant. Scattered  gas and stool in the colon. No small or large bowel distention. No free intra-abdominal air. No abnormal air-fluid levels. Degenerative changes in the spine and hips.  IMPRESSION: No evidence of active pulmonary disease. Nonobstructive bowel gas pattern.   Electronically Signed   By: Burman Nieves M.D.   On: 12/28/2012 23:08    Scheduled Meds: . DULoxetine  90 mg Oral Daily  . enoxaparin (LOVENOX) injection  30 mg Subcutaneous Q24H  . levothyroxine  50 mcg Oral QAC breakfast  . pantoprazole  40 mg Oral Q1200  . sodium chloride  3 mL Intravenous Q12H  . traZODone  300 mg Oral QHS   Continuous Infusions: . sodium chloride 75 mL/hr (12/29/12 0832)    Principal Problem:   Acute  gastroenteritis Active Problems:   HYPERTENSION, BENIGN ESSENTIAL   Hypothyroid   Hypovolemia    Time spent:    [facility]  [facility] Pager [phone]. If 7PM-7AM, please contact night-coverage at [email or web address], password [facility] 12/29/2012, 4:34 PM  LOS: 1 day

## 2012-12-29 NOTE — Progress Notes (Signed)
Pt ambulated in hall with NT, some DOE with ambulation (RR 22-25, SpO2 94% RA). Pt states she was not able to tolerate lunch as it was "too heavy" but did eat crackers and gingerale. MD aware. Will continue to monitor.

## 2012-12-29 NOTE — Progress Notes (Signed)
UR completed 

## 2013-01-11 NOTE — Discharge Summary (Signed)
Physician Discharge Summary  Lindsey Rowe:[id number] DOB: October 07, 1945 DOA: 12/28/2012  PCP: Astrid Divine, MD  Admit date: 12/28/2012 Discharge date: 12/30/2012  Time spent: 40 minutes  Recommendations for Outpatient Follow-up:  1. PCP in 1 week  Discharge Diagnoses:  Principal Problem:   Acute gastroenteritis Active Problems:   HYPERTENSION, BENIGN ESSENTIAL   Hypothyroid   Hypovolemia   Discharge Condition: stable  Diet recommendation:low sodium  Filed Weights   12/28/12 1642 12/28/12 2321 12/29/12 2058  Weight: 111.131 kg (245 lb) 110.542 kg (243 lb 11.2 oz) 110.542 kg (243 lb 11.2 oz)    History of present illness:  Chief Complaint: Nausea and vomiting  HPI: Lindsey Rowe is a 67 y.o. female with Past medical history of hypertension, hypothyroidism, GERD.  The patient is coming from home.  The patient is presenting with the complaint of nausea and vomiting that started earlier this morning she mentions she has been feeling nausea did throughout the day and initially had 2-4 vomiting episode without any blood and then she had dry having and retching throughout the day. Around often on time she started having diarrhea which were loose watery without any blood. She complains of generalized abdominal soreness but denies any deep abdominal pain or crampiness or burning abdominal pain. She comes of dizziness and severe lethargy. She denied any sensation of fever at home but was found febrile in the ED.  She denies any outside food or change in her medication or diet. 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 She denies any similar episode in the past.  Currently she complains of upper abdominal pain due to retching, mild shortness of breath due to pain and lethargy. She denies any chest pain, cough, fever, focal neurological deficit.      Hospital Course:  1. Acute gastroenteritis   -suspect Viral , Cdiff PCr negative  -improved  -treated with supportive care  -diet advanced  2. Hypothyroidism  Continue Synthroid   3. Hypertension  resume antihypertensives at present   4.Mood disorder  continue Cymbalta and trazodone   Discharge Exam: Filed Vitals:   12/30/12 0800  BP:   Pulse:   Temp:   Resp: 18    General: AAOx3 Cardiovascular: S1s2/RRR Respiratory: CTAB  Discharge Instructions  Discharge Orders   Future Orders Complete By Expires   Diet - low sodium heart healthy  As directed    Increase activity slowly  As directed        Medication List    STOP taking these medications       influenza vac split quadrivalent PF 0.5 ML injection  Commonly known as:  FLUARIX      TAKE these medications       DULoxetine 60 MG capsule  Commonly known as:  CYMBALTA  Take 60 mg by mouth daily. Takes total of 90mg  once daily     DULoxetine 30 MG capsule  Commonly known as:  CYMBALTA  Take 30 mg by mouth daily. Takes total of 90mg  once daily     HYDROcodone-acetaminophen 5-325 MG per tablet  Commonly known as:  NORCO/VICODIN  Take 1 tablet by mouth every 6 (six) hours as needed for moderate pain.     levothyroxine 50 MCG tablet  Commonly known as:  SYNTHROID, LEVOTHROID  Take 50 mcg by mouth daily before breakfast.     lisinopril 20 MG tablet  Commonly known as:  PRINIVIL,ZESTRIL  Take 20 mg by mouth daily.     MUCINEX ALLERGY 180 MG tablet  Generic drug:  fexofenadine  Take 180 mg by mouth daily as needed for allergies or rhinitis.     multivitamin with minerals tablet  Take 1 tablet by mouth daily.     omeprazole 10 MG capsule  Commonly known as:  PRILOSEC  Take 10 mg by mouth 2 (two) times daily.     pravastatin 10 MG tablet  Commonly known as:  PRAVACHOL  Take 10 mg by mouth at bedtime.     SUMAtriptan 25 MG tablet  Commonly known as:  IMITREX  Take 25 mg by mouth every 2 (two) hours as needed for migraine.     traZODone 100  MG tablet  Commonly known as:  DESYREL  Take 300 mg by mouth at bedtime.     TYLENOL ARTHRITIS PAIN PO  Take 2 tablets by mouth 2 (two) times daily.       Allergies  Allergen Reactions  . Gabapentin Other (See Comments)    Right foot and leg swelled   . Cefadroxil Itching    Ends of hair itched       The results of significant diagnostics from this hospitalization (including imaging, microbiology, ancillary and laboratory) are listed below for reference.    Significant Diagnostic Studies: Dg Abd Acute W/chest  12/28/2012   CLINICAL DATA:  Nausea, vomiting, and diarrhea. Lower chest pain. There is a history of gastric bypass in 1981.  EXAM: ACUTE ABDOMEN SERIES (ABDOMEN 2 VIEW & CHEST 1 VIEW)  COMPARISON:  Chest 08/13/2012  FINDINGS: Normal heart size and pulmonary vascularity. No focal airspace disease in the lungs. No blunting of costophrenic angles. No significant change since prior study.  Surgical clips in the left upper quadrant. Scattered gas and stool in the colon. No small or large bowel distention. No free intra-abdominal air. No abnormal air-fluid levels. Degenerative changes in the spine and hips.  IMPRESSION: No evidence of active pulmonary disease. Nonobstructive bowel gas pattern.   Electronically Signed   By: Burman Nieves M.D.   On: 12/28/2012 23:08    Microbiology: No results found for this or any previous visit (from the past 240 hour(s)).   Labs: Basic Metabolic Panel: No results found for this basename: NA, K, CL, CO2, GLUCOSE, BUN, CREATININE, CALCIUM, MG, PHOS,  in the last 168 hours Liver Function Tests: No results found for this basename: AST, ALT, ALKPHOS, BILITOT, PROT, ALBUMIN,  in the last 168 hours No results found for this basename: LIPASE, AMYLASE,  in the last 168 hours No results found for this basename: AMMONIA,  in the last 168 hours CBC: No results found for this basename: WBC, NEUTROABS, HGB, HCT, MCV, PLT,  in the last 168 hours Cardiac  Enzymes: No results found for this basename: CKTOTAL, CKMB, CKMBINDEX, TROPONINI,  in the last 168 hours BNP: BNP (last 3 results) No results found for this basename: PROBNP,  in the last 8760 hours CBG: No results found for this basename: GLUCAP,  in the last 168 hours     Signed:  Rehanna Oloughlin  [facility] 01/11/2013, 6:52 PM

## 2013-03-30 ENCOUNTER — Ambulatory Visit (INDEPENDENT_AMBULATORY_CARE_PROVIDER_SITE_OTHER): Payer: Self-pay | Admitting: Ophthalmology

## 2013-06-03 ENCOUNTER — Other Ambulatory Visit: Payer: Self-pay

## 2013-06-03 DIAGNOSIS — Z1231 Encounter for screening mammogram for malignant neoplasm of breast: Secondary | ICD-10-CM

## 2013-06-16 ENCOUNTER — Ambulatory Visit
Admission: RE | Admit: 2013-06-16 | Discharge: 2013-06-16 | Disposition: A | Discharge status: Home / Self Care | Payer: Medicare Other | Source: Ambulatory Visit

## 2013-06-16 ENCOUNTER — Encounter (INDEPENDENT_AMBULATORY_CARE_PROVIDER_SITE_OTHER): Payer: Self-pay

## 2013-06-16 DIAGNOSIS — Z1231 Encounter for screening mammogram for malignant neoplasm of breast: Secondary | ICD-10-CM

## 2013-07-09 ENCOUNTER — Encounter (INDEPENDENT_AMBULATORY_CARE_PROVIDER_SITE_OTHER): Payer: Medicare Other | Admitting: Ophthalmology

## 2013-07-09 DIAGNOSIS — I10 Essential (primary) hypertension: Secondary | ICD-10-CM

## 2013-07-09 DIAGNOSIS — H43819 Vitreous degeneration, unspecified eye: Secondary | ICD-10-CM

## 2013-07-09 DIAGNOSIS — H35379 Puckering of macula, unspecified eye: Secondary | ICD-10-CM

## 2013-07-09 DIAGNOSIS — H35039 Hypertensive retinopathy, unspecified eye: Secondary | ICD-10-CM

## 2014-01-08 ENCOUNTER — Ambulatory Visit (INDEPENDENT_AMBULATORY_CARE_PROVIDER_SITE_OTHER): Payer: Medicare Other | Admitting: Ophthalmology

## 2014-01-08 DIAGNOSIS — H35033 Hypertensive retinopathy, bilateral: Secondary | ICD-10-CM | POA: Diagnosis not present

## 2014-01-08 DIAGNOSIS — I10 Essential (primary) hypertension: Secondary | ICD-10-CM

## 2014-01-08 DIAGNOSIS — H43811 Vitreous degeneration, right eye: Secondary | ICD-10-CM

## 2014-01-08 DIAGNOSIS — H35372 Puckering of macula, left eye: Secondary | ICD-10-CM | POA: Diagnosis not present

## 2014-05-17 ENCOUNTER — Other Ambulatory Visit: Payer: Self-pay

## 2014-05-17 DIAGNOSIS — Z1231 Encounter for screening mammogram for malignant neoplasm of breast: Secondary | ICD-10-CM

## 2014-06-18 ENCOUNTER — Ambulatory Visit: Payer: Medicaid Other

## 2014-07-14 ENCOUNTER — Ambulatory Visit: Payer: Medicaid Other

## 2014-07-14 ENCOUNTER — Ambulatory Visit (INDEPENDENT_AMBULATORY_CARE_PROVIDER_SITE_OTHER): Payer: PPO | Admitting: Ophthalmology

## 2014-07-14 DIAGNOSIS — H35372 Puckering of macula, left eye: Secondary | ICD-10-CM

## 2014-07-14 DIAGNOSIS — H43813 Vitreous degeneration, bilateral: Secondary | ICD-10-CM | POA: Diagnosis not present

## 2014-07-14 DIAGNOSIS — I10 Essential (primary) hypertension: Secondary | ICD-10-CM | POA: Diagnosis not present

## 2014-07-14 DIAGNOSIS — H35033 Hypertensive retinopathy, bilateral: Secondary | ICD-10-CM

## 2014-08-11 ENCOUNTER — Ambulatory Visit: Payer: Medicaid Other

## 2014-09-14 ENCOUNTER — Ambulatory Visit: Payer: Medicaid Other

## 2014-10-26 ENCOUNTER — Ambulatory Visit: Payer: Medicaid Other

## 2014-11-22 ENCOUNTER — Ambulatory Visit
Admission: RE | Admit: 2014-11-22 | Discharge: 2014-11-22 | Disposition: A | Discharge status: Home / Self Care | Payer: Medicaid Other | Source: Ambulatory Visit

## 2014-11-22 DIAGNOSIS — Z1231 Encounter for screening mammogram for malignant neoplasm of breast: Secondary | ICD-10-CM

## 2015-02-22 DIAGNOSIS — I10 Essential (primary) hypertension: Secondary | ICD-10-CM | POA: Diagnosis not present

## 2015-02-22 DIAGNOSIS — E039 Hypothyroidism, unspecified: Secondary | ICD-10-CM | POA: Diagnosis not present

## 2015-02-22 DIAGNOSIS — F319 Bipolar disorder, unspecified: Secondary | ICD-10-CM | POA: Diagnosis not present

## 2015-02-22 DIAGNOSIS — E669 Obesity, unspecified: Secondary | ICD-10-CM | POA: Diagnosis not present

## 2015-02-22 DIAGNOSIS — M4800 Spinal stenosis, site unspecified: Secondary | ICD-10-CM | POA: Diagnosis not present

## 2015-02-22 DIAGNOSIS — Z1389 Encounter for screening for other disorder: Secondary | ICD-10-CM | POA: Diagnosis not present

## 2015-02-22 DIAGNOSIS — Z0000 Encounter for general adult medical examination without abnormal findings: Secondary | ICD-10-CM | POA: Diagnosis not present

## 2015-02-22 DIAGNOSIS — E7800 Pure hypercholesterolemia, unspecified: Secondary | ICD-10-CM | POA: Diagnosis not present

## 2015-02-22 DIAGNOSIS — M797 Fibromyalgia: Secondary | ICD-10-CM | POA: Diagnosis not present

## 2015-02-22 DIAGNOSIS — G43909 Migraine, unspecified, not intractable, without status migrainosus: Secondary | ICD-10-CM | POA: Diagnosis not present

## 2015-02-22 DIAGNOSIS — Z01419 Encounter for gynecological examination (general) (routine) without abnormal findings: Secondary | ICD-10-CM | POA: Diagnosis not present

## 2015-02-22 DIAGNOSIS — K219 Gastro-esophageal reflux disease without esophagitis: Secondary | ICD-10-CM | POA: Diagnosis not present

## 2015-04-05 DIAGNOSIS — F333 Major depressive disorder, recurrent, severe with psychotic symptoms: Secondary | ICD-10-CM | POA: Diagnosis not present

## 2015-06-08 DIAGNOSIS — F333 Major depressive disorder, recurrent, severe with psychotic symptoms: Secondary | ICD-10-CM | POA: Diagnosis not present

## 2015-07-20 ENCOUNTER — Ambulatory Visit (INDEPENDENT_AMBULATORY_CARE_PROVIDER_SITE_OTHER): Payer: PPO | Admitting: Ophthalmology

## 2015-08-23 DIAGNOSIS — F319 Bipolar disorder, unspecified: Secondary | ICD-10-CM | POA: Diagnosis not present

## 2015-08-23 DIAGNOSIS — M1990 Unspecified osteoarthritis, unspecified site: Secondary | ICD-10-CM | POA: Diagnosis not present

## 2015-08-23 DIAGNOSIS — E039 Hypothyroidism, unspecified: Secondary | ICD-10-CM | POA: Diagnosis not present

## 2015-08-23 DIAGNOSIS — M797 Fibromyalgia: Secondary | ICD-10-CM | POA: Diagnosis not present

## 2015-08-23 DIAGNOSIS — M4800 Spinal stenosis, site unspecified: Secondary | ICD-10-CM | POA: Diagnosis not present

## 2015-08-23 DIAGNOSIS — I10 Essential (primary) hypertension: Secondary | ICD-10-CM | POA: Diagnosis not present

## 2015-09-23 ENCOUNTER — Encounter (HOSPITAL_COMMUNITY): Payer: Self-pay | Admitting: Emergency Medicine

## 2015-09-23 ENCOUNTER — Emergency Department (HOSPITAL_COMMUNITY): Payer: PPO

## 2015-09-23 ENCOUNTER — Observation Stay (HOSPITAL_COMMUNITY)
Admission: EM | Admit: 2015-09-23 | Discharge: 2015-09-27 | Disposition: A | Discharge status: Skilled Nursing Facility | Payer: PPO | Attending: Internal Medicine | Admitting: Internal Medicine

## 2015-09-23 DIAGNOSIS — E039 Hypothyroidism, unspecified: Secondary | ICD-10-CM | POA: Insufficient documentation

## 2015-09-23 DIAGNOSIS — Y92009 Unspecified place in unspecified non-institutional (private) residence as the place of occurrence of the external cause: Secondary | ICD-10-CM | POA: Diagnosis not present

## 2015-09-23 DIAGNOSIS — M79604 Pain in right leg: Secondary | ICD-10-CM | POA: Diagnosis not present

## 2015-09-23 DIAGNOSIS — S93402A Sprain of unspecified ligament of left ankle, initial encounter: Secondary | ICD-10-CM | POA: Insufficient documentation

## 2015-09-23 DIAGNOSIS — I10 Essential (primary) hypertension: Secondary | ICD-10-CM | POA: Diagnosis not present

## 2015-09-23 DIAGNOSIS — E785 Hyperlipidemia, unspecified: Secondary | ICD-10-CM | POA: Insufficient documentation

## 2015-09-23 DIAGNOSIS — M7989 Other specified soft tissue disorders: Secondary | ICD-10-CM | POA: Diagnosis not present

## 2015-09-23 DIAGNOSIS — F319 Bipolar disorder, unspecified: Secondary | ICD-10-CM | POA: Diagnosis present

## 2015-09-23 DIAGNOSIS — K219 Gastro-esophageal reflux disease without esophagitis: Secondary | ICD-10-CM | POA: Insufficient documentation

## 2015-09-23 DIAGNOSIS — F5089 Other specified eating disorder: Secondary | ICD-10-CM | POA: Insufficient documentation

## 2015-09-23 DIAGNOSIS — M5126 Other intervertebral disc displacement, lumbar region: Secondary | ICD-10-CM | POA: Diagnosis not present

## 2015-09-23 DIAGNOSIS — M79605 Pain in left leg: Secondary | ICD-10-CM | POA: Diagnosis not present

## 2015-09-23 DIAGNOSIS — D72829 Elevated white blood cell count, unspecified: Secondary | ICD-10-CM | POA: Insufficient documentation

## 2015-09-23 DIAGNOSIS — M6281 Muscle weakness (generalized): Secondary | ICD-10-CM | POA: Diagnosis not present

## 2015-09-23 DIAGNOSIS — F329 Major depressive disorder, single episode, unspecified: Secondary | ICD-10-CM | POA: Diagnosis present

## 2015-09-23 DIAGNOSIS — M25572 Pain in left ankle and joints of left foot: Secondary | ICD-10-CM | POA: Diagnosis not present

## 2015-09-23 DIAGNOSIS — S4991XA Unspecified injury of right shoulder and upper arm, initial encounter: Secondary | ICD-10-CM | POA: Diagnosis not present

## 2015-09-23 DIAGNOSIS — Z9884 Bariatric surgery status: Secondary | ICD-10-CM | POA: Insufficient documentation

## 2015-09-23 DIAGNOSIS — Z791 Long term (current) use of non-steroidal anti-inflammatories (NSAID): Secondary | ICD-10-CM | POA: Diagnosis not present

## 2015-09-23 DIAGNOSIS — W1839XA Other fall on same level, initial encounter: Secondary | ICD-10-CM | POA: Insufficient documentation

## 2015-09-23 DIAGNOSIS — M5124 Other intervertebral disc displacement, thoracic region: Secondary | ICD-10-CM | POA: Diagnosis not present

## 2015-09-23 DIAGNOSIS — M25511 Pain in right shoulder: Secondary | ICD-10-CM | POA: Diagnosis not present

## 2015-09-23 DIAGNOSIS — F3130 Bipolar disorder, current episode depressed, mild or moderate severity, unspecified: Secondary | ICD-10-CM | POA: Diagnosis not present

## 2015-09-23 DIAGNOSIS — J9811 Atelectasis: Secondary | ICD-10-CM | POA: Diagnosis not present

## 2015-09-23 DIAGNOSIS — G8929 Other chronic pain: Secondary | ICD-10-CM | POA: Diagnosis not present

## 2015-09-23 DIAGNOSIS — M25579 Pain in unspecified ankle and joints of unspecified foot: Secondary | ICD-10-CM | POA: Diagnosis not present

## 2015-09-23 DIAGNOSIS — R29898 Other symptoms and signs involving the musculoskeletal system: Secondary | ICD-10-CM

## 2015-09-23 DIAGNOSIS — T148 Other injury of unspecified body region: Secondary | ICD-10-CM | POA: Diagnosis not present

## 2015-09-23 DIAGNOSIS — S99922A Unspecified injury of left foot, initial encounter: Secondary | ICD-10-CM | POA: Diagnosis not present

## 2015-09-23 DIAGNOSIS — R739 Hyperglycemia, unspecified: Secondary | ICD-10-CM

## 2015-09-23 DIAGNOSIS — M545 Low back pain, unspecified: Secondary | ICD-10-CM | POA: Diagnosis present

## 2015-09-23 DIAGNOSIS — F32A Depression, unspecified: Secondary | ICD-10-CM | POA: Diagnosis present

## 2015-09-23 DIAGNOSIS — Z79899 Other long term (current) drug therapy: Secondary | ICD-10-CM | POA: Diagnosis not present

## 2015-09-23 DIAGNOSIS — R630 Anorexia: Secondary | ICD-10-CM | POA: Diagnosis present

## 2015-09-23 DIAGNOSIS — M79672 Pain in left foot: Secondary | ICD-10-CM | POA: Diagnosis not present

## 2015-09-23 DIAGNOSIS — R531 Weakness: Principal | ICD-10-CM | POA: Insufficient documentation

## 2015-09-23 LAB — CK: Total CK: 94 U/L (ref 38–234)

## 2015-09-23 LAB — COMPREHENSIVE METABOLIC PANEL
ALT: 10 U/L — ABNORMAL LOW (ref 14–54)
AST: 21 U/L (ref 15–41)
Albumin: 3.9 g/dL (ref 3.5–5.0)
Alkaline Phosphatase: 78 U/L (ref 38–126)
Anion gap: 7 (ref 5–15)
BUN: 13 mg/dL (ref 6–20)
CO2: 31 mmol/L (ref 22–32)
Calcium: 9.1 mg/dL (ref 8.9–10.3)
Chloride: 99 mmol/L — ABNORMAL LOW (ref 101–111)
Creatinine, Ser: 0.81 mg/dL (ref 0.44–1.00)
GFR calc Af Amer: >60 mL/min (ref >60)
GFR calc non Af Amer: >60 mL/min (ref >60)
Glucose, Bld: 112 mg/dL — ABNORMAL HIGH (ref 65–99)
Potassium: 4.3 mmol/L (ref 3.5–5.1)
Sodium: 137 mmol/L (ref 135–145)
Total Bilirubin: 1.2 mg/dL (ref 0.3–1.2)
Total Protein: 7.4 g/dL (ref 6.5–8.1)

## 2015-09-23 LAB — CBC WITH DIFFERENTIAL/PLATELET
Basophils Absolute: 0 10*3/uL (ref 0.0–0.1)
Basophils Relative: 0 %
Eosinophils Absolute: 0 10*3/uL (ref 0.0–0.7)
Eosinophils Relative: 0 %
HCT: 41.7 % (ref 36.0–46.0)
Hemoglobin: 13.3 g/dL (ref 12.0–15.0)
Lymphocytes Relative: 12 %
Lymphs Abs: 1.6 10*3/uL (ref 0.7–4.0)
MCH: 30.5 pg (ref 26.0–34.0)
MCHC: 31.9 g/dL (ref 30.0–36.0)
MCV: 95.6 fL (ref 78.0–100.0)
Monocytes Absolute: 1.6 10*3/uL — ABNORMAL HIGH (ref 0.1–1.0)
Monocytes Relative: 12 %
Neutro Abs: 10.1 10*3/uL — ABNORMAL HIGH (ref 1.7–7.7)
Neutrophils Relative %: 76 %
Platelets: 282 10*3/uL (ref 150–400)
RBC: 4.36 MIL/uL (ref 3.87–5.11)
RDW: 13.7 % (ref 11.5–15.5)
WBC: 13.4 10*3/uL — ABNORMAL HIGH (ref 4.0–10.5)

## 2015-09-23 LAB — URINALYSIS, ROUTINE W REFLEX MICROSCOPIC
Bilirubin Urine: NEGATIVE
Glucose, UA: NEGATIVE mg/dL
Hgb urine dipstick: NEGATIVE
Ketones, ur: NEGATIVE mg/dL
Leukocytes, UA: NEGATIVE
Nitrite: POSITIVE — AB
Protein, ur: NEGATIVE mg/dL
Specific Gravity, Urine: 1.013 (ref 1.005–1.030)
pH: 5.5 (ref 5.0–8.0)

## 2015-09-23 LAB — URINE MICROSCOPIC-ADD ON
RBC / HPF: NONE SEEN RBC/hpf (ref 0–5)
WBC, UA: NONE SEEN WBC/hpf (ref 0–5)

## 2015-09-23 MED ORDER — HYDROMORPHONE HCL 1 MG/ML IJ SOLN: 1.0000 mg | Freq: Once | INTRAMUSCULAR | Status: DC

## 2015-09-23 MED ORDER — HYDROMORPHONE HCL 1 MG/ML IJ SOLN
1.0000 mg | Freq: Once | INTRAMUSCULAR | Status: AC
  Administered 2015-09-23: 1 mg via INTRAVENOUS
  Filled 2015-09-23: qty 1

## 2015-09-23 MED ORDER — HYDROCODONE-ACETAMINOPHEN 5-325 MG PO TABS
2.0000 | ORAL_TABLET | Freq: Once | ORAL | Status: AC
  Administered 2015-09-23: 2 via ORAL
  Filled 2015-09-23: qty 2

## 2015-09-23 MED ORDER — SODIUM CHLORIDE 0.9 % IV BOLUS (SEPSIS)
1000.0000 mL | Freq: Once | INTRAVENOUS | Status: AC
  Administered 2015-09-23: 1000 mL via INTRAVENOUS

## 2015-09-23 NOTE — ED Notes (Signed)
Patient transported to MRI 

## 2015-09-23 NOTE — ED Notes (Addendum)
Patient rates pain 9/10 states that she normally walks with a walker at home but, has not been able to ambulate since yesterday, c/o left foot and ankle pain and left shoulder pain.  Also, patient states that her right foot is beginning to hurt as well.  Patient has movement in bilateral feet and left shoulder. Patient was up to bedside commode with assistance x3.

## 2015-09-23 NOTE — ED Notes (Signed)
Patient in MRI 

## 2015-09-23 NOTE — ED Provider Notes (Signed)
[facility]-EMERGENCY DEPT Provider Note   CSN: [id number] Arrival date & time: 09/23/15  1727     History   Chief Complaint Chief Complaint  Patient presents with  . Extremity Weakness    HPI Lindsey GILLIAM is a 70 y.o. female presenting with bilateral leg weakness and leg pain. Patient states she has chronic bilateral leg pain but over the last couple weeks her weakness and pain have been worsening. It is to the point that she has had to start using a walker again. Last night she fell because when she stood up her left leg gave out. She was laying on the floor for about 11 hours she could not get up on her own. Called EMS, initially was not transported then could not get off the couch and so she called EMS again. Patient states she has been having increasing pain, mostly in her thighs. However over the last week or so she's been having left ankle pain and recently developed right shoulder pain posteriorly after try to push her self up off the floor. Has chronic low back pain and has had 2 back surgeries but states her pain does not seem particular worse today. No urinary or bowel incontinence. No dysuria. No abdominal pain, chest pain, dizziness, or headaches. She feels like her upper extremities are weak as well but no where near her lowers. Takes NSAIDs at home for pain. Will take hydrocodone if pain is bad but hasn't taken any recently.  HPI  Past Medical History:  Diagnosis Date  . Arthritis   . Back pain   . Bipolar 1 disorder (HCC)   . Complication of anesthesia    hard to wake up anesthesia  . Depression   . GERD (gastroesophageal reflux disease)   . Headache(784.0)    migraines and tension headaches  . Hyperlipidemia   . Hypertension   . Hypothyroidism   . Thyroid disease     Patient Active Problem List   Diagnosis Date Noted  . Hypothyroid 12/28/2012  . Hypovolemia 12/28/2012  . Acute gastroenteritis 12/28/2012  . Hypotension 08/13/2012  . Dehydration 08/13/2012  .  AKI (acute kidney injury) (HCC) 08/13/2012  . Hypoventilation associated with obesity syndrome (HCC) 08/13/2012  . Acute respiratory failure (HCC) 07/26/2012  . Aspiration pneumonia (HCC) 07/26/2012  . Chest pain 07/26/2012  . ANKLE SPRAIN, RIGHT 03/16/2010  . DIVERTICULOSIS, COLON 10/31/2009  . SKIN LESION 09/23/2009  . SHOULDER PAIN, LEFT 07/05/2009  . COUGH 03/29/2009  . HEADACHE 03/11/2009  . OTITIS MEDIA, RIGHT 12/13/2008  . BRUISE 12/06/2008  . ACID REFLUX DISEASE 09/13/2008  . ALLERGIC RHINITIS 05/20/2008  . UNSPECIFIED DISEASE OF HAIR AND HAIR FOLLICLES 05/20/2008  . DERMATITIS 11/12/2007  . OBESITY 07/18/2007  . HYPERCHOLESTEROLEMIA 05/13/2007  . BIPOLAR AFFECTIVE DISORDER 05/12/2007  . HYPERTENSION, BENIGN ESSENTIAL 05/12/2007  . BACK PAIN, LUMBAR, WITH RADICULOPATHY 05/12/2007    Past Surgical History:  Procedure Laterality Date  . APPENDECTOMY    . BACK SURGERY    . EYE SURGERY Bilateral    lens implant  . GASTRIC BYPASS    . LUMBAR LAMINECTOMY/DECOMPRESSION MICRODISCECTOMY Right 07/23/2012   Procedure: LUMBAR LAMINECTOMY/DECOMPRESSION MICRODISCECTOMY 2 LEVELS;  Surgeon: Karn Cassis, MD;  Location: [facility];  Service: Neurosurgery;  Laterality: Right;  Right Lumbar three-four  lumbar four-five Laminectomy/Foraminotomy  . NASAL SINUS SURGERY Bilateral   . TONSILLECTOMY    . WRIST SURGERY      OB History    No data available  Home Medications    Prior to Admission medications   Medication Sig Start Date End Date Taking? Authorizing Provider  cholecalciferol (VITAMIN D) 1000 units tablet Take 1,000 Units by mouth daily.   Yes Historical Provider, MD  DULoxetine (CYMBALTA) 30 MG capsule Take 30 mg by mouth every morning.    Yes Historical Provider, MD  DULoxetine (CYMBALTA) 60 MG capsule Take 60 mg by mouth every evening.    Yes Historical Provider, MD  HYDROcodone-acetaminophen (NORCO/VICODIN) 5-325 MG per tablet Take 1 tablet by mouth every 6  (six) hours as needed for moderate pain.   Yes Historical Provider, MD  levothyroxine (SYNTHROID, LEVOTHROID) 75 MCG tablet Take 75 mcg by mouth daily before breakfast.  09/06/15  Yes Historical Provider, MD  lisinopril (PRINIVIL,ZESTRIL) 10 MG tablet Take 10 mg by mouth daily.  09/06/15  Yes Historical Provider, MD  meloxicam (MOBIC) 15 MG tablet Take 15 mg by mouth daily.  08/23/15  Yes Historical Provider, MD  omeprazole (PRILOSEC) 10 MG capsule Take 10 mg by mouth 2 (two) times daily.    Yes Historical Provider, MD  pravastatin (PRAVACHOL) 20 MG tablet Take 20 mg by mouth at bedtime.  09/06/15  Yes Historical Provider, MD  traZODone (DESYREL) 100 MG tablet Take 300 mg by mouth at bedtime.    Yes Historical Provider, MD  LYRICA 100 MG capsule Take 100 mg by mouth 2 (two) times daily as needed (pain).  08/03/15   Historical Provider, MD  SUMAtriptan (IMITREX) 25 MG tablet Take 25 mg by mouth every 2 (two) hours as needed for migraine.    Historical Provider, MD    Family History History reviewed. No pertinent family history.  Social History Social History  Substance Use Topics  . Smoking status: Never Smoker  . Smokeless tobacco: Never Used  . Alcohol use Yes     Comment: social     Allergies   Gabapentin and Cefadroxil   Review of Systems Review of Systems  Constitutional: Negative for fever.  Respiratory: Negative for shortness of breath.   Cardiovascular: Negative for chest pain.  Gastrointestinal: Negative for abdominal pain and vomiting.  Genitourinary: Negative for dysuria.  Musculoskeletal: Positive for back pain, gait problem and myalgias.  Neurological: Positive for weakness. Negative for headaches.  All other systems reviewed and are negative.    Physical Exam Updated Vital Signs BP 128/79 (BP Location: Right Arm)   Pulse 84   Temp 98.2 F (36.8 C) (Oral)   Resp 18   SpO2 94%   Physical Exam  Constitutional: She is oriented to person, place, and time. She  appears well-developed and well-nourished.  Morbidly obese  HENT:  Head: Normocephalic and atraumatic.  Right Ear: External ear normal.  Left Ear: External ear normal.  Nose: Nose normal.  Eyes: Right eye exhibits no discharge. Left eye exhibits no discharge.  Cardiovascular: Normal rate, regular rhythm and normal heart sounds.   Pulses:      Dorsalis pedis pulses are 2+ on the right side, and 2+ on the left side.  Pulmonary/Chest: Effort normal and breath sounds normal.  Abdominal: Soft. There is no tenderness.  Musculoskeletal:       Right shoulder: She exhibits tenderness (mild, posterior). She exhibits normal range of motion.       Left ankle: She exhibits swelling (mild). Tenderness.       Right upper leg: She exhibits no tenderness.       Left upper leg: She exhibits no tenderness.  Left lower leg: She exhibits no tenderness.       Left foot: There is tenderness (mild, mid foot).  Neurological: She is alert and oriented to person, place, and time.  5/5 strength in BUE. 4/5 strength in BLE but seems more like pain than weakness. She can resist for a little bit but causes pain. Causes pain in proximal thighs. Normal gross sensation  Skin: Skin is warm and dry.  Nursing note and vitals reviewed.    ED Treatments / Results  Labs (all labs ordered are listed, but only abnormal results are displayed) Labs Reviewed  COMPREHENSIVE METABOLIC PANEL - Abnormal; Notable for the following:       Result Value   Chloride 99 (*)    Glucose, Bld 112 (*)    ALT 10 (*)    All other components within normal limits  CBC WITH DIFFERENTIAL/PLATELET - Abnormal; Notable for the following:    WBC 13.4 (*)    Neutro Abs 10.1 (*)    Monocytes Absolute 1.6 (*)    All other components within normal limits  URINALYSIS, ROUTINE W REFLEX MICROSCOPIC (NOT AT [facility]) - Abnormal; Notable for the following:    Nitrite POSITIVE (*)    All other components within normal limits  URINE MICROSCOPIC-ADD  ON - Abnormal; Notable for the following:    Squamous Epithelial / LPF 0-5 (*)    Bacteria, UA RARE (*)    All other components within normal limits  CK    EKG  EKG Interpretation None       Radiology Dg Chest 2 View  Result Date: 09/23/2015 CLINICAL DATA:  Acute onset of generalized weakness, status post fall. Initial encounter. EXAM: CHEST  2 VIEW COMPARISON:  Chest radiograph performed 12/28/2012 FINDINGS: There is elevation of the right hemidiaphragm, with mild bilateral atelectasis. No pleural effusion or pneumothorax is seen. The heart is normal in size. No acute osseous abnormalities are seen. Clips are noted about the gastroesophageal junction. IMPRESSION: Elevation of the right hemidiaphragm, with mild bilateral atelectasis. Electronically Signed   By: Roanna RaiderJeffery  Chang M.D.   On: 09/23/2015 23:35   Dg Shoulder Right  Result Date: 09/23/2015 CLINICAL DATA:  Fall today.  Right shoulder pain. EXAM: RIGHT SHOULDER - 2+ VIEW COMPARISON:  None. FINDINGS: The right shoulder is located. No acute fracture is present. Moderate degenerative changes are present [facility] the [facility]. The clavicle is intact. The scapula is intact. The visualized right hemi thorax is clear. IMPRESSION: 1. Moderate degenerative changes at the right [facility]. 2. No acute abnormality. Electronically Signed   By: Marin Robertshristopher  Mattern M.D.   On: 09/23/2015 19:53   Dg Ankle Complete Left  Result Date: 09/23/2015 CLINICAL DATA:  Fall today.  Left foot and ankle pain. EXAM: LEFT ANKLE COMPLETE - 3+ VIEW COMPARISON:  Left ankle radiograph 08/22/2012 FINDINGS: Mild soft tissue swelling is present over the medial malleolus. There is no underlying fracture. Chronic irregularity at the medial malleolus is stable. IMPRESSION: Soft tissue swelling over the medial malleolus without underlying fracture. Electronically Signed   By: Marin Robertshristopher  Mattern M.D.   On: 09/23/2015 19:58   Mr Thoracic Spine Wo Contrast  Result Date:  09/23/2015 CLINICAL DATA:  Patient with BILATERAL chronic leg pain. Patient fell and was on the ground for several hours. Weaker than usual and cannot get around. Previous lumbar surgery. EXAM: MRI THORACIC AND LUMBAR SPINE WITHOUT CONTRAST TECHNIQUE: Multiplanar and multiecho pulse sequences of the thoracic and lumbar spine were obtained without  intravenous contrast. COMPARISON:  None. FINDINGS: MRI THORACIC SPINE FINDINGS Alignment:  Anatomic Segmentation: Previous lumbar spine MRIs are available and correlate with 5 lumbar-type vertebrae. Counting down the odontoid, there are only 11 thoracic vertebrae if numeric assignment of 5 lumbar vertebrae is established. The previous nomenclature will be continued. The last thoracolumbar disc space will be labeled T11-L1. Vertebrae: No worrisome osseous lesion.  No compression fracture. Cord: Multiple areas of stenosis without significant cord compression, most notable at T10-11. No abnormal cord signal. Paraspinal and other soft tissues: Unremarkable. Disc levels: He had The individual disc spaces are examined as follows: T1-2:  Unremarkable. T2-3:  Unremarkable. T3-4:  Unremarkable. T4-5:  Annular bulge. T5-6:  Unremarkable. T6-7:  Shallow central protrusion.  No impingement. T7-8:  Annular bulge. T8-9: Central protrusion. Posterior element hypertrophy. No impingement. T9-10: Disc space narrowing. Annular bulge. Posterior element hypertrophy. No impingement. T10-11: Central protrusion. Posterior element hypertrophy. Moderate stenosis. No frank cord compression or abnormal cord signal. T11-L1: Central protrusion. Posterior element hypertrophy. No impingement. MRI LUMBAR SPINE FINDINGS Segmentation: Abnormal thoracic segmentation. LEFT Thoracolumbar interspace labeled T11-L1. Alignment:  Anatomic Vertebrae:  Degenerative change.  No acute osseous findings. Conus medullaris: Extends to the L1 level and appears normal. Paraspinal and other soft tissues: Unremarkable. Disc  levels: L1-L2: Central and leftward protrusion. Posterior element hypertrophy. LEFT subarticular zone narrowing could affect the L2 nerve root. L2-L3: Central protrusion. Posterior element hypertrophy. Mild stenosis. Either L3 nerve root could be affected. L3-L4: Central protrusion. Prior laminectomy with pseudomeningocele. No residual impingement. L4-L5: Advanced disc space narrowing. Central laminectomy. Dorsal pseudomeningocele. No stenosis. No residual impingement. L5-S1: Central and rightward protrusion. Posterior element hypertrophy. RIGHT greater than LEFT subarticular zone and foraminal zone narrowing could affect the L5 and S1 nerve roots. IMPRESSION: MR THORACIC SPINE IMPRESSION Segmentation anomaly.  Only 11 thoracic vertebrae are reported. No thoracic compression fracture. No intraspinal mass lesion, significant cord compression, or abnormal cord signal. Multilevel spondylosis, worst at T10-11. MR LUMBAR SPINE IMPRESSION No lumbar compression fracture or significant stenosis. Previous lumbar decompression at L3-4 and L4-5 without residual impingement. Central and rightward disc protrusion at L5-S1 in association with posterior element hypertrophy could affect the RIGHT greater than LEFT L5 and S1 nerve roots. Electronically Signed   By: Elsie Stain M.D.   On: 09/23/2015 21:24   Mr Lumbar Spine Wo Contrast  Result Date: 09/23/2015 CLINICAL DATA:  Patient with BILATERAL chronic leg pain. Patient fell and was on the ground for several hours. Weaker than usual and cannot get around. Previous lumbar surgery. EXAM: MRI THORACIC AND LUMBAR SPINE WITHOUT CONTRAST TECHNIQUE: Multiplanar and multiecho pulse sequences of the thoracic and lumbar spine were obtained without intravenous contrast. COMPARISON:  None. FINDINGS: MRI THORACIC SPINE FINDINGS Alignment:  Anatomic Segmentation: Previous lumbar spine MRIs are available and correlate with 5 lumbar-type vertebrae. Counting down the odontoid, there are only  11 thoracic vertebrae if numeric assignment of 5 lumbar vertebrae is established. The previous nomenclature will be continued. The last thoracolumbar disc space will be labeled T11-L1. Vertebrae: No worrisome osseous lesion.  No compression fracture. Cord: Multiple areas of stenosis without significant cord compression, most notable at T10-11. No abnormal cord signal. Paraspinal and other soft tissues: Unremarkable. Disc levels: He had The individual disc spaces are examined as follows: T1-2:  Unremarkable. T2-3:  Unremarkable. T3-4:  Unremarkable. T4-5:  Annular bulge. T5-6:  Unremarkable. T6-7:  Shallow central protrusion.  No impingement. T7-8:  Annular bulge. T8-9: Central protrusion. Posterior element hypertrophy. No impingement.  T9-10: Disc space narrowing. Annular bulge. Posterior element hypertrophy. No impingement. T10-11: Central protrusion. Posterior element hypertrophy. Moderate stenosis. No frank cord compression or abnormal cord signal. T11-L1: Central protrusion. Posterior element hypertrophy. No impingement. MRI LUMBAR SPINE FINDINGS Segmentation: Abnormal thoracic segmentation. LEFT Thoracolumbar interspace labeled T11-L1. Alignment:  Anatomic Vertebrae:  Degenerative change.  No acute osseous findings. Conus medullaris: Extends to the L1 level and appears normal. Paraspinal and other soft tissues: Unremarkable. Disc levels: L1-L2: Central and leftward protrusion. Posterior element hypertrophy. LEFT subarticular zone narrowing could affect the L2 nerve root. L2-L3: Central protrusion. Posterior element hypertrophy. Mild stenosis. Either L3 nerve root could be affected. L3-L4: Central protrusion. Prior laminectomy with pseudomeningocele. No residual impingement. L4-L5: Advanced disc space narrowing. Central laminectomy. Dorsal pseudomeningocele. No stenosis. No residual impingement. L5-S1: Central and rightward protrusion. Posterior element hypertrophy. RIGHT greater than LEFT subarticular zone and  foraminal zone narrowing could affect the L5 and S1 nerve roots. IMPRESSION: MR THORACIC SPINE IMPRESSION Segmentation anomaly.  Only 11 thoracic vertebrae are reported. No thoracic compression fracture. No intraspinal mass lesion, significant cord compression, or abnormal cord signal. Multilevel spondylosis, worst at T10-11. MR LUMBAR SPINE IMPRESSION No lumbar compression fracture or significant stenosis. Previous lumbar decompression at L3-4 and L4-5 without residual impingement. Central and rightward disc protrusion at L5-S1 in association with posterior element hypertrophy could affect the RIGHT greater than LEFT L5 and S1 nerve roots. Electronically Signed   By: Elsie StainJohn T Curnes M.D.   On: 09/23/2015 21:24   Dg Foot Complete Left  Result Date: 09/23/2015 CLINICAL DATA:  Fall today.  Left ankle and foot pain. EXAM: LEFT FOOT - COMPLETE 3+ VIEW COMPARISON:  Left ankle and foot radiographs 08/22/2012 FINDINGS: Progressive osteopenia is present. No acute bone or soft tissue abnormalities are present. Calcaneal spurs are stable. IMPRESSION: 1. No acute osseous abnormality. 2. Progressive osteopenia. Electronically Signed   By: Marin Robertshristopher  Mattern M.D.   On: 09/23/2015 19:54    Procedures Procedures (including critical care time)  Medications Ordered in ED Medications  sodium chloride 0.9 % bolus 1,000 mL (not administered)  HYDROcodone-acetaminophen (NORCO/VICODIN) 5-325 MG per tablet 2 tablet (2 tablets Oral Given 09/23/15 1903)     Initial Impression / Assessment and Plan / ED Course  I have reviewed the triage vital signs and the nursing notes.  Pertinent labs & imaging results that were available during my care of the patient were reviewed by me and considered in my medical decision making (see chart for details).  Clinical Course  Comment By Time  Given leg weakness and chronic back pain with previous surgeries, will get MRI. Will check electrolytes and urine. Pricilla LovelessScott Cephus Tupy, MD 08/18 [phone]   MRI does not show acute spinal cord emergency. Likely some of these bulging disc are contributing to her pain. However think this is mostly pain related and she had trouble standing. Will give IV pain medicine and reassess and try to re-ambulate. Pricilla LovelessScott Geronimo Diliberto, MD 08/18 2155  Patient was unable to ambulate. Seems to be pain related. However given that her blood pressure has gone down a little, she is now requiring submental oxygen based on the IV Dilaudid, and lives at home or self, I think she will need overnight observation for better pain control and possibly physical therapy in the morning. Do not feel she would be safe to send home. Consult hospitalist for admission. Pricilla LovelessScott Jalasia Eskridge, MD 08/19 0002  Dr. Antionette Charpyd to admit. MedSurg obs. Pricilla LovelessScott Breah Joa, MD 08/19 [id number]    Final  Clinical Impressions(s) / ED Diagnoses   Final diagnoses:  Leg weakness, bilateral    New Prescriptions New Prescriptions   No medications on file     Pricilla Loveless, MD 09/24/15 0050

## 2015-09-23 NOTE — ED Triage Notes (Signed)
Per EMS patient has bilateral chronic leg and bilateral ankle pain.  States she fell and laid on ground for 11 hours.  Patient lives at apt by herself.  Reports she is weaker than usual and cannot get around. Patient has walker and cane at home.  EMS out earlier today after fall and placed patient on couch and pt states she couldn't get off couch so EMS called again.

## 2015-09-23 NOTE — ED Notes (Signed)
Patient unable to walk. Three personal had to hold patient up to pivot to bed side toilet.

## 2015-09-24 ENCOUNTER — Encounter (HOSPITAL_COMMUNITY): Payer: Self-pay | Admitting: Family Medicine

## 2015-09-24 ENCOUNTER — Observation Stay (HOSPITAL_BASED_OUTPATIENT_CLINIC_OR_DEPARTMENT_OTHER): Payer: PPO

## 2015-09-24 DIAGNOSIS — F329 Major depressive disorder, single episode, unspecified: Secondary | ICD-10-CM | POA: Diagnosis not present

## 2015-09-24 DIAGNOSIS — R29898 Other symptoms and signs involving the musculoskeletal system: Secondary | ICD-10-CM | POA: Diagnosis not present

## 2015-09-24 DIAGNOSIS — M7989 Other specified soft tissue disorders: Secondary | ICD-10-CM | POA: Diagnosis not present

## 2015-09-24 DIAGNOSIS — R531 Weakness: Principal | ICD-10-CM

## 2015-09-24 DIAGNOSIS — M79605 Pain in left leg: Secondary | ICD-10-CM

## 2015-09-24 DIAGNOSIS — F32A Depression, unspecified: Secondary | ICD-10-CM | POA: Diagnosis present

## 2015-09-24 DIAGNOSIS — E038 Other specified hypothyroidism: Secondary | ICD-10-CM

## 2015-09-24 DIAGNOSIS — F319 Bipolar disorder, unspecified: Secondary | ICD-10-CM | POA: Diagnosis not present

## 2015-09-24 DIAGNOSIS — M545 Low back pain: Secondary | ICD-10-CM

## 2015-09-24 DIAGNOSIS — R630 Anorexia: Secondary | ICD-10-CM | POA: Diagnosis present

## 2015-09-24 DIAGNOSIS — M25511 Pain in right shoulder: Secondary | ICD-10-CM

## 2015-09-24 DIAGNOSIS — M79609 Pain in unspecified limb: Secondary | ICD-10-CM | POA: Diagnosis not present

## 2015-09-24 DIAGNOSIS — M79604 Pain in right leg: Secondary | ICD-10-CM

## 2015-09-24 LAB — T4, FREE: Free T4: 1.2 ng/dL — ABNORMAL HIGH (ref 0.61–1.12)

## 2015-09-24 LAB — SEDIMENTATION RATE: Sed Rate: 23 mm/hr — ABNORMAL HIGH (ref 0–22)

## 2015-09-24 LAB — VITAMIN B12: Vitamin B-12: 579 pg/mL (ref 180–914)

## 2015-09-24 LAB — URIC ACID: Uric Acid, Serum: 7 mg/dL — ABNORMAL HIGH (ref 2.3–6.6)

## 2015-09-24 LAB — TSH: TSH: 3.287 u[IU]/mL (ref 0.350–4.500)

## 2015-09-24 MED ORDER — SODIUM CHLORIDE 0.9 % IV SOLN
INTRAVENOUS | Status: AC
  Administered 2015-09-24: 04:00:00 via INTRAVENOUS

## 2015-09-24 MED ORDER — PANTOPRAZOLE SODIUM 40 MG PO TBEC
40.0000 mg | DELAYED_RELEASE_TABLET | Freq: Every day | ORAL | Status: DC
  Administered 2015-09-24 – 2015-09-27 (×4): 40 mg via ORAL
  Filled 2015-09-24 (×4): qty 1

## 2015-09-24 MED ORDER — VITAMIN D 1000 UNITS PO TABS
1000.0000 [IU] | ORAL_TABLET | Freq: Every day | ORAL | Status: DC
  Administered 2015-09-24 – 2015-09-27 (×4): 1000 [IU] via ORAL
  Filled 2015-09-24 (×4): qty 1

## 2015-09-24 MED ORDER — PRAVASTATIN SODIUM 20 MG PO TABS
20.0000 mg | ORAL_TABLET | Freq: Every day | ORAL | Status: DC
  Administered 2015-09-24 – 2015-09-26 (×2): 20 mg via ORAL
  Filled 2015-09-24 (×3): qty 1

## 2015-09-24 MED ORDER — TRAZODONE HCL 100 MG PO TABS
300.0000 mg | ORAL_TABLET | Freq: Every day | ORAL | Status: DC
  Administered 2015-09-24 – 2015-09-26 (×2): 300 mg via ORAL
  Filled 2015-09-24 (×3): qty 3

## 2015-09-24 MED ORDER — KETOROLAC TROMETHAMINE 15 MG/ML IJ SOLN
15.0000 mg | Freq: Four times a day (QID) | INTRAMUSCULAR | Status: DC | PRN
  Administered 2015-09-24: 15 mg via INTRAVENOUS
  Filled 2015-09-24: qty 1

## 2015-09-24 MED ORDER — ONDANSETRON HCL 4 MG/2ML IJ SOLN: 4.0000 mg | Freq: Four times a day (QID) | INTRAMUSCULAR | Status: DC | PRN

## 2015-09-24 MED ORDER — PREGABALIN 50 MG PO CAPS
100.0000 mg | ORAL_CAPSULE | Freq: Two times a day (BID) | ORAL | Status: DC | PRN
  Administered 2015-09-24: 100 mg via ORAL
  Filled 2015-09-24: qty 2

## 2015-09-24 MED ORDER — BISACODYL 5 MG PO TBEC: 5.0000 mg | DELAYED_RELEASE_TABLET | Freq: Every day | ORAL | Status: DC | PRN

## 2015-09-24 MED ORDER — IBUPROFEN 200 MG PO TABS
600.0000 mg | ORAL_TABLET | Freq: Four times a day (QID) | ORAL | Status: DC | PRN
  Administered 2015-09-25 – 2015-09-26 (×2): 600 mg via ORAL
  Filled 2015-09-24 (×3): qty 3

## 2015-09-24 MED ORDER — POLYETHYLENE GLYCOL 3350 17 G PO PACK: 17.0000 g | PACK | Freq: Every day | ORAL | Status: DC | PRN

## 2015-09-24 MED ORDER — LISINOPRIL 10 MG PO TABS
10.0000 mg | ORAL_TABLET | Freq: Every day | ORAL | Status: DC
  Administered 2015-09-24 – 2015-09-25 (×2): 10 mg via ORAL
  Filled 2015-09-24 (×2): qty 1

## 2015-09-24 MED ORDER — ACETAMINOPHEN 325 MG PO TABS: 650.0000 mg | ORAL_TABLET | Freq: Four times a day (QID) | ORAL | Status: DC | PRN

## 2015-09-24 MED ORDER — LEVOTHYROXINE SODIUM 75 MCG PO TABS
75.0000 ug | ORAL_TABLET | Freq: Every day | ORAL | Status: DC
  Administered 2015-09-24 – 2015-09-27 (×4): 75 ug via ORAL
  Filled 2015-09-24 (×4): qty 1

## 2015-09-24 MED ORDER — HYDROCODONE-ACETAMINOPHEN 5-325 MG PO TABS
1.0000 | ORAL_TABLET | Freq: Four times a day (QID) | ORAL | Status: DC | PRN
  Administered 2015-09-24: 2 via ORAL
  Administered 2015-09-24 – 2015-09-27 (×5): 1 via ORAL
  Filled 2015-09-24 (×3): qty 1
  Filled 2015-09-24: qty 2
  Filled 2015-09-24 (×2): qty 1

## 2015-09-24 MED ORDER — DULOXETINE HCL 60 MG PO CPEP
60.0000 mg | ORAL_CAPSULE | Freq: Every evening | ORAL | Status: DC
  Administered 2015-09-24 – 2015-09-26 (×3): 60 mg via ORAL
  Filled 2015-09-24 (×3): qty 1

## 2015-09-24 MED ORDER — ONDANSETRON HCL 4 MG PO TABS: 4.0000 mg | ORAL_TABLET | Freq: Four times a day (QID) | ORAL | Status: DC | PRN

## 2015-09-24 MED ORDER — DULOXETINE HCL 30 MG PO CPEP
30.0000 mg | ORAL_CAPSULE | Freq: Every day | ORAL | Status: DC
  Administered 2015-09-24 – 2015-09-27 (×4): 30 mg via ORAL
  Filled 2015-09-24 (×4): qty 1

## 2015-09-24 MED ORDER — ENOXAPARIN SODIUM 40 MG/0.4ML ~~LOC~~ SOLN
40.0000 mg | SUBCUTANEOUS | Status: DC
  Administered 2015-09-24 – 2015-09-27 (×4): 40 mg via SUBCUTANEOUS
  Filled 2015-09-24 (×4): qty 0.4

## 2015-09-24 NOTE — Progress Notes (Signed)
Patient admitted after midnight, please see H&P.duplex negative for DVT.  PT Eval.  Suspect patient may need placement vs more help at home.  Marlin CanaryJessica Amica Harron DO

## 2015-09-24 NOTE — H&P (Signed)
History and Physical    [name] TKZ:[id number] DOB: 11/06/1945 DOA: 09/23/2015  PCP: Osborne Casco, MD   Patient coming from: Home   Chief Complaint: Leg pain, weakness, fall   HPI: Lindsey Rowe is a 70 y.o. female with medical history significant for depression, GERD, hypertension, hypothyroidism, and chronic low back pain with radiculopathy status post multilevel lumbar decompression who presents to the emergency department with weakness and acute on chronic pain in the left ankle following a fall at home. Patient reports progressive weakness over the past week. At baseline, she ambulates with a cane or walker, but has been experiencing increasing difficulty with this and suffered a ground-level mechanical fall at home on 09/23/2015 without head strike or loss of consciousness. She reports being too weak to get up following the fall and laid on the floor for 11 hours before calling EMS. EMS arrived and helped the patient to her couch but she called them back out later in the day because she was too weak to get up from the couch. She denies any recent fevers or chills, denies chest pain or palpitations, and denies dyspnea or cough. She suffers from migraines but is had no unusual headache, change in vision or hearing, loss of coordination, or focal numbness or weakness. She reports that her brother, the "only family I have," was diagnosed with a brain mass which has been resected and he remains hospitalized. She has been more depressed surrounding her brother's illness and reports extremely poor oral intake for the past week or more. She denies homicidal or suicidal ideation and denies hallucinations. She lives alone.  ED Course: Upon arrival to the ED, patient is found to be afebrile, saturating adequately on room air, and with vital signs stable. Radiographs of the left ankle demonstrates soft tissue swelling at the medial malleolus but without acute fracture or dislocation. Left  foot radiographs are negative for acute injury and radiographs of the right shoulder demonstrate moderate degenerative disease of the before meals joint but no acute features. Chest x-ray was notable only for mild bibasilar atelectasis. MR lumbar and thoracic spines were obtained with no acute findings in the thoracic spine. Lumbar spine features central and rightward disc protrusion at L5-S1 with posterior element hypertrophy which could affect the right greater than left L5 and S1 nerve roots. BMP was unremarkable and CBC features a mild leukocytosis to 13,400. Urinalysis has rare bacteria but negative leukocyte and no white cells are seen. Patient was given a liter bolus in the ED and treated with multiple doses of IV narcotic for her pain. She was unable to ambulate and required max assistance. She remained hemodynamically stable and will be observed on the medical/surgical unit for ongoing evaluation and management of generalized weakness and acute on chronic leg pain.   Review of Systems:  All other systems reviewed and apart from HPI, are negative.  Past Medical History:  Diagnosis Date  . Arthritis   . Back pain   . Bipolar 1 disorder ([place])   . Complication of anesthesia    hard to wake up anesthesia  . Depression   . GERD (gastroesophageal reflux disease)   . Headache(784.0)    migraines and tension headaches  . Hyperlipidemia   . Hypertension   . Hypothyroidism   . Thyroid disease     Past Surgical History:  Procedure Laterality Date  . APPENDECTOMY    . BACK SURGERY    . EYE SURGERY Bilateral    lens implant  .  GASTRIC BYPASS    . LUMBAR LAMINECTOMY/DECOMPRESSION MICRODISCECTOMY Right 07/23/2012   Procedure: LUMBAR LAMINECTOMY/DECOMPRESSION MICRODISCECTOMY 2 LEVELS;  Surgeon: Floyce Stakes, MD;  Location: [facility];  Service: Neurosurgery;  Laterality: Right;  Right Lumbar three-four  lumbar four-five Laminectomy/Foraminotomy  . NASAL SINUS SURGERY Bilateral   .  TONSILLECTOMY    . WRIST SURGERY       reports that she has never smoked. She has never used smokeless tobacco. She reports that she drinks alcohol. She reports that she does not use drugs.  Allergies  Allergen Reactions  . Gabapentin Other (See Comments)    Right foot and leg swelled   . Cefadroxil Itching    Ends of hair itched     History reviewed. No pertinent family history.   Prior to Admission medications   Medication Sig Start Date End Date Taking? Authorizing Provider  cholecalciferol (VITAMIN D) 1000 units tablet Take 1,000 Units by mouth daily.   Yes Historical Provider, MD  DULoxetine (CYMBALTA) 30 MG capsule Take 30 mg by mouth every morning.    Yes Historical Provider, MD  DULoxetine (CYMBALTA) 60 MG capsule Take 60 mg by mouth every evening.    Yes Historical Provider, MD  HYDROcodone-acetaminophen (NORCO/VICODIN) 5-325 MG per tablet Take 1 tablet by mouth every 6 (six) hours as needed for moderate pain.   Yes Historical Provider, MD  levothyroxine (SYNTHROID, LEVOTHROID) 75 MCG tablet Take 75 mcg by mouth daily before breakfast.  09/06/15  Yes Historical Provider, MD  lisinopril (PRINIVIL,ZESTRIL) 10 MG tablet Take 10 mg by mouth daily.  09/06/15  Yes Historical Provider, MD  meloxicam (MOBIC) 15 MG tablet Take 15 mg by mouth daily.  08/23/15  Yes Historical Provider, MD  omeprazole (PRILOSEC) 10 MG capsule Take 10 mg by mouth 2 (two) times daily.    Yes Historical Provider, MD  pravastatin (PRAVACHOL) 20 MG tablet Take 20 mg by mouth at bedtime.  09/06/15  Yes Historical Provider, MD  traZODone (DESYREL) 100 MG tablet Take 300 mg by mouth at bedtime.    Yes Historical Provider, MD  LYRICA 100 MG capsule Take 100 mg by mouth 2 (two) times daily as needed (pain).  08/03/15   Historical Provider, MD  SUMAtriptan (IMITREX) 25 MG tablet Take 25 mg by mouth every 2 (two) hours as needed for migraine.    Historical Provider, MD    Physical Exam: Vitals:   09/23/15 2249 09/23/15  2303 09/24/15 0121 09/24/15 0243  BP: 96/59 96/57 112/66 118/65  Pulse: 83 91 105 88  Resp: _0 Temp:    98.3 F (36.8 C)  TempSrc:    Oral  SpO2: 97% 93% 96% 94%      Constitutional: NAD, calm, comfortable Eyes: PERTLA, lids and conjunctivae normal ENMT: Mucous membranes are dry. Posterior pharynx clear of any exudate or lesions.   Neck: normal, supple, no masses, no thyromegaly Respiratory: clear to auscultation bilaterally, no wheezing, no crackles. Normal respiratory effort.   Cardiovascular: S1 & S2 heard, regular rate and rhythm. No extremity edema. No significant JVD. Abdomen: No distension, no tenderness, no masses palpated. Bowel sounds normal.  Musculoskeletal: no clubbing / cyanosis. No joint deformity upper and lower extremities. Left ankle swelling, tenderness.   Skin: no significant rashes, lesions, ulcers. Warm, dry, well-perfused. Neurologic: CN 2-12 grossly intact. Sensation intact, DTR normal. Strength testing of LE's severely limited by pain.  Psychiatric: Normal judgment and insight. Alert and oriented x 3. Normal mood and  affect.     Labs on Admission: I have personally reviewed following labs and imaging studies  CBC:  Recent Labs Lab 09/23/15 1908  WBC 13.4*  NEUTROABS 10.1*  HGB 13.3  HCT 41.7  MCV 95.6  PLT 102   Basic Metabolic Panel:  Recent Labs Lab 09/23/15 1908  NA 137  K 4.3  CL 99*  CO2 31  GLUCOSE 112*  BUN 13  CREATININE 0.81  CALCIUM 9.1   GFR: CrCl cannot be calculated (Unknown ideal weight.). Liver Function Tests:  Recent Labs Lab 09/23/15 1908  AST 21  ALT 10*  ALKPHOS 78  BILITOT 1.2  PROT 7.4  ALBUMIN 3.9   No results for input(s): LIPASE, AMYLASE in the last 168 hours. No results for input(s): AMMONIA in the last 168 hours. Coagulation Profile: No results for input(s): INR, PROTIME in the last 168 hours. Cardiac Enzymes:  Recent Labs Lab 09/23/15 1908  CKTOTAL 94   BNP (last 3 results) No  results for input(s): PROBNP in the last 8760 hours. HbA1C: No results for input(s): HGBA1C in the last 72 hours. CBG: No results for input(s): GLUCAP in the last 168 hours. Lipid Profile: No results for input(s): CHOL, HDL, LDLCALC, TRIG, CHOLHDL, LDLDIRECT in the last 72 hours. Thyroid Function Tests: No results for input(s): TSH, T4TOTAL, FREET4, T3FREE, THYROIDAB in the last 72 hours. Anemia Panel: No results for input(s): VITAMINB12, FOLATE, FERRITIN, TIBC, IRON, RETICCTPCT in the last 72 hours. Urine analysis:    Component Value Date/Time   COLORURINE YELLOW 09/23/2015 2123   APPEARANCEUR CLEAR 09/23/2015 2123   LABSPEC 1.013 09/23/2015 2123   PHURINE 5.5 09/23/2015 2123   GLUCOSEU NEGATIVE 09/23/2015 2123   HGBUR NEGATIVE 09/23/2015 2123   HGBUR trace-lysed 09/23/2009 0000   BILIRUBINUR NEGATIVE 09/23/2015 2123   KETONESUR NEGATIVE 09/23/2015 2123   PROTEINUR NEGATIVE 09/23/2015 2123   UROBILINOGEN 1.0 08/13/2012 1757   NITRITE POSITIVE (A) 09/23/2015 2123   LEUKOCYTESUR NEGATIVE 09/23/2015 2123   Sepsis Labs: _0 (procalcitonin:4,lacticidven:4) )No results found for this or any previous visit (from the past 240 hour(s)).   Radiological Exams on Admission: Dg Chest 2 View  Result Date: 09/23/2015 CLINICAL DATA:  Acute onset of generalized weakness, status post fall. Initial encounter. EXAM: CHEST  2 VIEW COMPARISON:  Chest radiograph performed 12/28/2012 FINDINGS: There is elevation of the right hemidiaphragm, with mild bilateral atelectasis. No pleural effusion or pneumothorax is seen. The heart is normal in size. No acute osseous abnormalities are seen. Clips are noted about the gastroesophageal junction. IMPRESSION: Elevation of the right hemidiaphragm, with mild bilateral atelectasis. Electronically Signed   By: Garald Balding M.D.   On: 09/23/2015 23:35   Dg Shoulder Right  Result Date: 09/23/2015 CLINICAL DATA:  Fall today.  Right shoulder pain. EXAM: RIGHT  SHOULDER - 2+ VIEW COMPARISON:  None. FINDINGS: The right shoulder is located. No acute fracture is present. Moderate degenerative changes are present at the [facility] joint. The clavicle is intact. The scapula is intact. The visualized right hemi thorax is clear. IMPRESSION: 1. Moderate degenerative changes at the right [facility] joint. 2. No acute abnormality. Electronically Signed   By: San Morelle M.D.   On: 09/23/2015 19:53   Dg Ankle Complete Left  Result Date: 09/23/2015 CLINICAL DATA:  Fall today.  Left foot and ankle pain. EXAM: LEFT ANKLE COMPLETE - 3+ VIEW COMPARISON:  Left ankle radiograph 08/22/2012 FINDINGS: Mild soft tissue swelling is present over the medial malleolus. There is no underlying fracture. Chronic  irregularity at the medial malleolus is stable. IMPRESSION: Soft tissue swelling over the medial malleolus without underlying fracture. Electronically Signed   By: San Morelle M.D.   On: 09/23/2015 19:58   Mr Thoracic Spine Wo Contrast  Result Date: 09/23/2015 CLINICAL DATA:  Patient with BILATERAL chronic leg pain. Patient fell and was on the ground for several hours. Weaker than usual and cannot get around. Previous lumbar surgery. EXAM: MRI THORACIC AND LUMBAR SPINE WITHOUT CONTRAST TECHNIQUE: Multiplanar and multiecho pulse sequences of the thoracic and lumbar spine were obtained without intravenous contrast. COMPARISON:  None. FINDINGS: MRI THORACIC SPINE FINDINGS Alignment:  Anatomic Segmentation: Previous lumbar spine MRIs are available and correlate with 5 lumbar-type vertebrae. Counting down the odontoid, there are only 11 thoracic vertebrae if numeric assignment of 5 lumbar vertebrae is established. The previous nomenclature will be continued. The last thoracolumbar disc space will be labeled T11-L1. Vertebrae: No worrisome osseous lesion.  No compression fracture. Cord: Multiple areas of stenosis without significant cord compression, most notable at T10-11. No abnormal  cord signal. Paraspinal and other soft tissues: Unremarkable. Disc levels: He had The individual disc spaces are examined as follows: T1-2:  Unremarkable. T2-3:  Unremarkable. T3-4:  Unremarkable. T4-5:  Annular bulge. T5-6:  Unremarkable. T6-7:  Shallow central protrusion.  No impingement. T7-8:  Annular bulge. T8-9: Central protrusion. Posterior element hypertrophy. No impingement. T9-10: Disc space narrowing. Annular bulge. Posterior element hypertrophy. No impingement. T10-11: Central protrusion. Posterior element hypertrophy. Moderate stenosis. No frank cord compression or abnormal cord signal. T11-L1: Central protrusion. Posterior element hypertrophy. No impingement. MRI LUMBAR SPINE FINDINGS Segmentation: Abnormal thoracic segmentation. LEFT Thoracolumbar interspace labeled T11-L1. Alignment:  Anatomic Vertebrae:  Degenerative change.  No acute osseous findings. Conus medullaris: Extends to the L1 level and appears normal. Paraspinal and other soft tissues: Unremarkable. Disc levels: L1-L2: Central and leftward protrusion. Posterior element hypertrophy. LEFT subarticular zone narrowing could affect the L2 nerve root. L2-L3: Central protrusion. Posterior element hypertrophy. Mild stenosis. Either L3 nerve root could be affected. L3-L4: Central protrusion. Prior laminectomy with pseudomeningocele. No residual impingement. L4-L5: Advanced disc space narrowing. Central laminectomy. Dorsal pseudomeningocele. No stenosis. No residual impingement. L5-S1: Central and rightward protrusion. Posterior element hypertrophy. RIGHT greater than LEFT subarticular zone and foraminal zone narrowing could affect the L5 and S1 nerve roots. IMPRESSION: MR THORACIC SPINE IMPRESSION Segmentation anomaly.  Only 11 thoracic vertebrae are reported. No thoracic compression fracture. No intraspinal mass lesion, significant cord compression, or abnormal cord signal. Multilevel spondylosis, worst at T10-11. MR LUMBAR SPINE IMPRESSION No  lumbar compression fracture or significant stenosis. Previous lumbar decompression at L3-4 and L4-5 without residual impingement. Central and rightward disc protrusion at L5-S1 in association with posterior element hypertrophy could affect the RIGHT greater than LEFT L5 and S1 nerve roots. Electronically Signed   By: Staci Righter M.D.   On: 09/23/2015 21:24   Mr Lumbar Spine Wo Contrast  Result Date: 09/23/2015 CLINICAL DATA:  Patient with BILATERAL chronic leg pain. Patient fell and was on the ground for several hours. Weaker than usual and cannot get around. Previous lumbar surgery. EXAM: MRI THORACIC AND LUMBAR SPINE WITHOUT CONTRAST TECHNIQUE: Multiplanar and multiecho pulse sequences of the thoracic and lumbar spine were obtained without intravenous contrast. COMPARISON:  None. FINDINGS: MRI THORACIC SPINE FINDINGS Alignment:  Anatomic Segmentation: Previous lumbar spine MRIs are available and correlate with 5 lumbar-type vertebrae. Counting down the odontoid, there are only 11 thoracic vertebrae if numeric assignment of 5 lumbar vertebrae is established.  The previous nomenclature will be continued. The last thoracolumbar disc space will be labeled T11-L1. Vertebrae: No worrisome osseous lesion.  No compression fracture. Cord: Multiple areas of stenosis without significant cord compression, most notable at T10-11. No abnormal cord signal. Paraspinal and other soft tissues: Unremarkable. Disc levels: He had The individual disc spaces are examined as follows: T1-2:  Unremarkable. T2-3:  Unremarkable. T3-4:  Unremarkable. T4-5:  Annular bulge. T5-6:  Unremarkable. T6-7:  Shallow central protrusion.  No impingement. T7-8:  Annular bulge. T8-9: Central protrusion. Posterior element hypertrophy. No impingement. T9-10: Disc space narrowing. Annular bulge. Posterior element hypertrophy. No impingement. T10-11: Central protrusion. Posterior element hypertrophy. Moderate stenosis. No frank cord compression or  abnormal cord signal. T11-L1: Central protrusion. Posterior element hypertrophy. No impingement. MRI LUMBAR SPINE FINDINGS Segmentation: Abnormal thoracic segmentation. LEFT Thoracolumbar interspace labeled T11-L1. Alignment:  Anatomic Vertebrae:  Degenerative change.  No acute osseous findings. Conus medullaris: Extends to the L1 level and appears normal. Paraspinal and other soft tissues: Unremarkable. Disc levels: L1-L2: Central and leftward protrusion. Posterior element hypertrophy. LEFT subarticular zone narrowing could affect the L2 nerve root. L2-L3: Central protrusion. Posterior element hypertrophy. Mild stenosis. Either L3 nerve root could be affected. L3-L4: Central protrusion. Prior laminectomy with pseudomeningocele. No residual impingement. L4-L5: Advanced disc space narrowing. Central laminectomy. Dorsal pseudomeningocele. No stenosis. No residual impingement. L5-S1: Central and rightward protrusion. Posterior element hypertrophy. RIGHT greater than LEFT subarticular zone and foraminal zone narrowing could affect the L5 and S1 nerve roots. IMPRESSION: MR THORACIC SPINE IMPRESSION Segmentation anomaly.  Only 11 thoracic vertebrae are reported. No thoracic compression fracture. No intraspinal mass lesion, significant cord compression, or abnormal cord signal. Multilevel spondylosis, worst at T10-11. MR LUMBAR SPINE IMPRESSION No lumbar compression fracture or significant stenosis. Previous lumbar decompression at L3-4 and L4-5 without residual impingement. Central and rightward disc protrusion at L5-S1 in association with posterior element hypertrophy could affect the RIGHT greater than LEFT L5 and S1 nerve roots. Electronically Signed   By: Staci Righter M.D.   On: 09/23/2015 21:24   Dg Foot Complete Left  Result Date: 09/23/2015 CLINICAL DATA:  Fall today.  Left ankle and foot pain. EXAM: LEFT FOOT - COMPLETE 3+ VIEW COMPARISON:  Left ankle and foot radiographs 08/22/2012 FINDINGS: Progressive  osteopenia is present. No acute bone or soft tissue abnormalities are present. Calcaneal spurs are stable. IMPRESSION: 1. No acute osseous abnormality. 2. Progressive osteopenia. Electronically Signed   By: San Morelle M.D.   On: 09/23/2015 19:54    EKG: Not performed, will obtain as appropriate  Assessment/Plan  1. Generalized weakness - Fell on day of admission and had to call EMS to help her up; they helped her to couch, but she had to call them back later because she couldn't get up from the couch  - Likely multifactorial - Pt reports very poor appetite since learning of her brother's brain tumor recently - Depression likely contributing  - Urine is neg for infection  - Check thyroid studies; check ESR for PMR given the concomimant shoulder pain and weakness  - PT eval requested   2. Left ankle sprain  - Radiographs negative for acute fracture or dislocation  - Ice intermittently, apply ACE wrap for compression, elevate the ankle  - Pain-control with prn's    3. Depression, bipolar affective disorder   - Depression has been exacerbated by her brother's recent brain tumor diagnosis   - She denies SI, HI, or hallucinations  - Continue Cymbalta and trazodone  4. Hypothyroidism  - Check thyroid studies given her presentation  - Continue current-dose Synthroid for now    5. Leukocytosis  - WBC 13,400 on admission with no fever or apparent focus of infection - Culture if febrile, but watch off abx for now    DVT prophylaxis: sq Lovenox  Code Status: Full  Family Communication: Discussed with patient Disposition Plan: Observe on med-surg Consults called: None Admission status: Observation    Vianne Bulls, MD [facility] Pager [phone]  If 7PM-7AM, please contact night-coverage [email or web address] Password [facility]  09/24/2015, 3:44 AM

## 2015-09-24 NOTE — Progress Notes (Signed)
Physical Therapy Treatment Patient Details Name: Lindsey DupesDeborah C Rowe MRN: [id number] DOB: [birth year] Today's Date: 09/24/2015    History of Present Illness Pt admitted through ED with Bilat foot pain s/p falls at home.  Pt with hx of back pain s/p laminectomy (14), Bipolar and chronic Bilat LE pain and weakness    PT Comments    Pt cooperative but requiring increased time and assist of two for pain limited mobilization.    Follow Up Recommendations  SNF     Equipment Recommendations  None recommended by PT    Recommendations for Other Services OT consult     Precautions / Restrictions Precautions Precautions: Fall Restrictions Weight Bearing Restrictions: No    Mobility  Bed Mobility Overal bed mobility: Needs Assistance Bed Mobility: Sit to Supine     Supine to sit: Min guard Sit to supine: Mod assist   General bed mobility comments: Assist with bilat LEs into bed  Transfers Overall transfer level: Needs assistance Equipment used: Rolling walker (2 wheeled) Transfers: Sit to/from Stand Sit to Stand: Mod assist;+2 physical assistance;+2 safety/equipment;From elevated surface Stand pivot transfers: Mod assist;+2 physical assistance;+2 safety/equipment       General transfer comment: cues for transition position, LE management and use of UEs to self assist.  Stand pvt completed utilizing RW and moving in very small increments with cues for posture, position from RW, and increased UE WB.  Ambulation/Gait             General Gait Details: Stand pvt only 2* increased foot pain with WB   Stairs            Wheelchair Mobility    Modified Rankin (Stroke Patients Only)       Balance Overall balance assessment: Needs assistance Sitting-balance support: Feet supported;No upper extremity supported Sitting balance-Leahy Scale: Good     Standing balance support: Bilateral upper extremity supported Standing balance-Leahy Scale: Poor                       Cognition Arousal/Alertness: Awake/alert Behavior During Therapy: WFL for tasks assessed/performed Overall Cognitive Status: Within Functional Limits for tasks assessed                      Exercises      General Comments        Pertinent Vitals/Pain Pain Assessment: Faces Faces Pain Scale: Hurts whole lot Pain Location: L foot and R great toe Pain Descriptors / Indicators: Grimacing;Guarding Pain Intervention(s): Limited activity within patient's tolerance;Monitored during session;Premedicated before session    Home Living Family/patient expects to be discharged to:: Private residence Living Arrangements: Alone Available Help at Discharge: Friend(s);Available PRN/intermittently Type of Home: Apartment Home Access: Level entry   Home Layout: One level Home Equipment: Walker - 2 wheels;Cane - single point      Prior Function Level of Independence: Independent with assistive device(s)          PT Goals (current goals can now be found in the care plan section) Acute Rehab PT Goals Patient Stated Goal: less pain PT Goal Formulation: With patient Time For Goal Achievement: 10/08/15 Potential to Achieve Goals: Fair Progress towards PT goals: Progressing toward goals    Frequency  Min 3X/week    PT Plan Current plan remains appropriate    Co-evaluation             End of Session Equipment Utilized During Treatment: Gait belt Activity Tolerance: Patient limited by pain  Patient left: in bed     Time: [id number] PT Time Calculation (min) (ACUTE ONLY): 11 min  Charges:  $Therapeutic Activity: 8-22 mins                    G Codes:  Functional Assessment Tool Used: Clinical judgement Functional Limitation: Mobility: Walking and moving around Mobility: Walking and Moving Around Current Status ([id number]([id number]): At least 40 percent but less than 60 percent impaired, limited or restricted Mobility: Walking and Moving Around Goal Status [phone]([id number]): At least  1 percent but less than 20 percent impaired, limited or restricted   East Side Surgery CenterBRADSHAW,Skyelar Swigart 09/24/2015, 5:55 PM

## 2015-09-24 NOTE — Progress Notes (Signed)
Pt O2 sat 67 on RA. O2 via nasal cannula initiated at 2L. O2 sat up to 97%. Will continue to monitor.

## 2015-09-24 NOTE — ED Notes (Signed)
Informed by floor that patient room may change.  Holding patient until receive a call back.

## 2015-09-24 NOTE — Progress Notes (Signed)
Oxygen level assessed with assistance of PT oxygen level at rest 92%  sitting 94% standing 96% on room  air .  Patient's oxygen level dropped to 86% once while sitting and BSC but quickly recovered.  When patient is sleeping oxygen level 86% oxygen applied at 2 liters and notified Dr. Benjamine MolaVann of findings will continue to monitor.

## 2015-09-24 NOTE — Progress Notes (Signed)
Attempted to call ED for report. Remained on hold for 7 minutes.

## 2015-09-24 NOTE — Progress Notes (Signed)
VASCULAR LAB PRELIMINARY  PRELIMINARY  PRELIMINARY  PRELIMINARY  Bilateral lower extremity venous duplex completed.    Preliminary report:  There is no obvious evidence of DVT or SVT noted in the visualized veins of the bilateral lower extremities.   Carlin Mamone, RVT 09/24/2015, 11:13 AM

## 2015-09-24 NOTE — Evaluation (Addendum)
Physical Therapy Evaluation Patient Details Name: Lindsey Rowe MRN: [id number] DOB: 03-13-1945 Today's Date: 09/24/2015   History of Present Illness  Pt admitted through ED with Bilat foot pain s/p falls at home.  Pt with hx of back pain s/p laminectomy (14), Bipolar and chronic Bilat LE pain and weakness  Clinical Impression  Pt admitted as above and presenting with functional mobility limitations 2* generalized weakness, balance deficits and bilat foot pain limiting ability to WB.  Unless pain level can be controlled to allow pt to mobilize toward IND, pt will need follow up rehab at SNF level to further address deficits.    Follow Up Recommendations SNF    Equipment Recommendations  None recommended by PT    Recommendations for Other Services OT consult     Precautions / Restrictions Precautions Precautions: Fall Restrictions Weight Bearing Restrictions: No      Mobility  Bed Mobility Overal bed mobility: Needs Assistance Bed Mobility: Supine to Sit     Supine to sit: Min guard    General bed mobility comments: INcreased time and pt utilizing bed rail to self assist over EOB, min assist to complete transition to EOB.   Transfers Overall transfer level: Needs assistance Equipment used: Rolling walker (2 wheeled) Transfers: Sit to/from Stand Sit to Stand: Mod assist;+2 physical assistance;+2 safety/equipment;From elevated surface Stand pivot transfers: Mod assist;+2 physical assistance;+2 safety/equipment       General transfer comment: cues for transition position, LE management and use of UEs to self assist.  Bed utilized to assist pt to standing.  Stand pvt completed utilizing RW and moving in very small increments with cues for posture, position from RW, and increased UE WB.  Ambulation/Gait             General Gait Details: Stand pvt only 2* increased foot pain with WB  Stairs            Wheelchair Mobility    Modified Rankin (Stroke  Patients Only)       Balance Overall balance assessment: Needs assistance Sitting-balance support: Feet supported;No upper extremity supported Sitting balance-Leahy Scale: Good     Standing balance support: Bilateral upper extremity supported Standing balance-Leahy Scale: Poor                               Pertinent Vitals/Pain Pain Assessment: Faces Faces Pain Scale: Hurts whole lot Pain Location: L foot and R great toe - increased with attempts to WB Pain Descriptors / Indicators: Grimacing;Guarding Pain Intervention(s): Limited activity within patient's tolerance;Monitored during session;Premedicated before session    Home Living Family/patient expects to be discharged to:: Private residence Living Arrangements: Alone Available Help at Discharge: Friend(s);Available PRN/intermittently Type of Home: Apartment Home Access: Level entry     Home Layout: One level Home Equipment: Walker - 2 wheels;Cane - single point      Prior Function Level of Independence: Independent with assistive device(s)               Hand Dominance        Extremity/Trunk Assessment   Upper Extremity Assessment: Generalized weakness           Lower Extremity Assessment: Generalized weakness      Cervical / Trunk Assessment: Kyphotic  Communication   Communication: No difficulties  Cognition Arousal/Alertness: Awake/alert Behavior During Therapy: WFL for tasks assessed/performed Overall Cognitive Status: Within Functional Limits for tasks assessed  General Comments      Exercises        Assessment/Plan    PT Assessment Patient needs continued PT services  PT Diagnosis Difficulty walking;Acute pain   PT Problem List Decreased strength;Decreased activity tolerance;Decreased balance;Decreased mobility;Decreased knowledge of use of DME;Decreased safety awareness;Obesity;Pain  PT Treatment Interventions DME instruction;Gait  training;Functional mobility training;Therapeutic activities;Therapeutic exercise;Balance training;Patient/family education   PT Goals (Current goals can be found in the Care Plan section) Acute Rehab PT Goals Patient Stated Goal: less pain PT Goal Formulation: With patient Time For Goal Achievement: 10/08/15 Potential to Achieve Goals: Fair    Frequency Min 3X/week   Barriers to discharge Decreased caregiver support Pt lives alone    Co-evaluation               End of Session Equipment Utilized During Treatment: Gait belt Activity Tolerance: Patient limited by pain Patient left: Other (comment) [facility] Nurse Communication: Mobility status    Functional Assessment Tool Used: Clinical judgement Functional Limitation: Mobility: Walking and moving around Mobility: Walking and Moving Around Current Status ([id number](G8978): At least 40 percent but less than 60 percent impaired, limited or restricted Mobility: Walking and Moving Around Goal Status [phone]([id number]): At least 1 percent but less than 20 percent impaired, limited or restricted    Time: 1602-1619 PT Time Calculation (min) (ACUTE ONLY): 17 min   Charges:   PT Evaluation $PT Eval Low Complexity: 1 Procedure     PT G Codes:   PT G-Codes **NOT FOR INPATIENT CLASS** Functional Assessment Tool Used: Clinical judgement Functional Limitation: Mobility: Walking and moving around Mobility: Walking and Moving Around Current Status (U9811(G8978): At least 40 percent but less than 60 percent impaired, limited or restricted Mobility: Walking and Moving Around Goal Status [phone](G8979): At least 1 percent but less than 20 percent impaired, limited or restricted    [facility] CenterBRADSHAW,Lindsey Ion 09/24/2015, 5:48 PM

## 2015-09-24 NOTE — Progress Notes (Signed)
Pt presents with obvious self neglect. Three person assist to move from bed to bedside commode. Numerous sores throughout body. Yeast in abdominal folds. Lives alone and claims to have someone come over for an hour a day Mon-Fri. Does not have transportation. States that sometimes "friends" take her to appointments. Can communicate needs but is completely unable to perform any ADLs.

## 2015-09-25 DIAGNOSIS — F329 Major depressive disorder, single episode, unspecified: Secondary | ICD-10-CM | POA: Diagnosis not present

## 2015-09-25 DIAGNOSIS — R531 Weakness: Secondary | ICD-10-CM | POA: Diagnosis not present

## 2015-09-25 DIAGNOSIS — F319 Bipolar disorder, unspecified: Secondary | ICD-10-CM | POA: Diagnosis not present

## 2015-09-25 DIAGNOSIS — M79604 Pain in right leg: Secondary | ICD-10-CM | POA: Diagnosis not present

## 2015-09-25 DIAGNOSIS — E038 Other specified hypothyroidism: Secondary | ICD-10-CM | POA: Diagnosis not present

## 2015-09-25 LAB — BASIC METABOLIC PANEL
Anion gap: 5 (ref 5–15)
BUN: 13 mg/dL (ref 6–20)
CO2: 33 mmol/L — ABNORMAL HIGH (ref 22–32)
Calcium: 8.8 mg/dL — ABNORMAL LOW (ref 8.9–10.3)
Chloride: 102 mmol/L (ref 101–111)
Creatinine, Ser: 0.77 mg/dL (ref 0.44–1.00)
GFR calc Af Amer: >60 mL/min (ref >60)
GFR calc non Af Amer: >60 mL/min (ref >60)
Glucose, Bld: 116 mg/dL — ABNORMAL HIGH (ref 65–99)
Potassium: 3.7 mmol/L (ref 3.5–5.1)
Sodium: 140 mmol/L (ref 135–145)

## 2015-09-25 LAB — CBC
HCT: 37.1 % (ref 36.0–46.0)
Hemoglobin: 11.4 g/dL — ABNORMAL LOW (ref 12.0–15.0)
MCH: 30.5 pg (ref 26.0–34.0)
MCHC: 30.7 g/dL (ref 30.0–36.0)
MCV: 99.2 fL (ref 78.0–100.0)
Platelets: 260 10*3/uL (ref 150–400)
RBC: 3.74 MIL/uL — ABNORMAL LOW (ref 3.87–5.11)
RDW: 14.2 % (ref 11.5–15.5)
WBC: 10 10*3/uL (ref 4.0–10.5)

## 2015-09-25 MED ORDER — PREDNISONE 20 MG PO TABS
50.0000 mg | ORAL_TABLET | Freq: Every day | ORAL | Status: DC
  Administered 2015-09-25 – 2015-09-27 (×3): 50 mg via ORAL
  Filled 2015-09-25 (×3): qty 2

## 2015-09-25 NOTE — NC FL2 (Signed)
[place] MEDICAID FL2 LEVEL OF CARE SCREENING TOOL     IDENTIFICATION  Patient Name: Lindsey DupesDeborah C Reveron Birthdate: [birth year] Sex: female Admission Date (Current Location): 09/23/2015  [facility] [place]   Facility and Address:  [facility] [place], [address], [place], [place] [address]      Provider Number: [id number]  Attending Physician Name and Address:  Joseph ArtJessica U Vann, DO  Relative Name and Phone Number:       Current Level of Care: Hospital Recommended Level of Care: Skilled Nursing Facility Prior Approval Number:    Date Approved/Denied:   PASRR Number:    Discharge Plan: SNF    Current Diagnoses: Patient Active Problem List   Diagnosis Date Noted  . Generalized weakness 09/24/2015  . Leg pain, bilateral 09/24/2015  . Depression 09/24/2015  . Loss of appetite 09/24/2015  . Leg weakness, bilateral   . Hypothyroid 12/28/2012  . Hypovolemia 12/28/2012  . Acute gastroenteritis 12/28/2012  . Hypotension 08/13/2012  . Dehydration 08/13/2012  . AKI (acute kidney injury) (HCC) 08/13/2012  . Hypoventilation associated with obesity syndrome (HCC) 08/13/2012  . Acute respiratory failure (HCC) 07/26/2012  . Aspiration pneumonia (HCC) 07/26/2012  . Chest pain 07/26/2012  . ANKLE SPRAIN, RIGHT 03/16/2010  . DIVERTICULOSIS, COLON 10/31/2009  . SKIN LESION 09/23/2009  . Shoulder pain, right 07/05/2009  . COUGH 03/29/2009  . HEADACHE 03/11/2009  . OTITIS MEDIA, RIGHT 12/13/2008  . BRUISE 12/06/2008  . ACID REFLUX DISEASE 09/13/2008  . ALLERGIC RHINITIS 05/20/2008  . UNSPECIFIED DISEASE OF HAIR AND HAIR FOLLICLES 05/20/2008  . DERMATITIS 11/12/2007  . OBESITY 07/18/2007  . HYPERCHOLESTEROLEMIA 05/13/2007  . BIPOLAR AFFECTIVE DISORDER 05/12/2007  . HYPERTENSION, BENIGN ESSENTIAL 05/12/2007  . Low back pain potentially associated with radiculopathy 05/12/2007    Orientation RESPIRATION BLADDER Height & Weight     Self,  Time, Situation, Place  O2 (1L [place]) Continent Weight: 120.2 kg (265 lb) (pt reported) Height:  5' 3.5" (161.3 cm)  BEHAVIORAL SYMPTOMS/MOOD NEUROLOGICAL BOWEL NUTRITION STATUS      Continent Diet (see DC summary)  AMBULATORY STATUS COMMUNICATION OF NEEDS Skin   Extensive Assist Verbally  (some wounds in skin folds)                       Personal Care Assistance Level of Assistance  Bathing, Dressing Bathing Assistance: Maximum assistance   Dressing Assistance: Maximum assistance     Functional Limitations Info             SPECIAL CARE FACTORS FREQUENCY  PT (By licensed PT)     PT Frequency: 5/wk OT Frequency: 5/wk            Contractures      Additional Factors Info  Code Status, Allergies, Psychotropic Code Status Info: FULL Allergies Info: Gabapentin, Cefadroxil Psychotropic Info: cymbalta         Current Medications (09/25/2015):  This is the current hospital active medication list Current Facility-Administered Medications  Medication Dose Route Frequency Provider Last Rate Last Dose  . acetaminophen (TYLENOL) tablet 650 mg  650 mg Oral Q6H PRN Lavone Neriimothy S Opyd, MD      . bisacodyl (DULCOLAX) EC tablet 5 mg  5 mg Oral Daily PRN Briscoe Deutscherimothy S Opyd, MD      . cholecalciferol (VITAMIN D) tablet 1,000 Units  1,000 Units Oral Daily Briscoe Deutscherimothy S Opyd, MD   1,000 Units at 09/25/15 [phone]  . DULoxetine (CYMBALTA) DR capsule  30 mg  30 mg Oral Daily Briscoe Deutscherimothy S Opyd, MD   30 mg at 09/25/15 0941  . DULoxetine (CYMBALTA) DR capsule 60 mg  60 mg Oral QPM Briscoe Deutscherimothy S Opyd, MD   60 mg at 09/24/15 1959  . enoxaparin (LOVENOX) injection 40 mg  40 mg Subcutaneous Q24H Briscoe Deutscherimothy S Opyd, MD   40 mg at 09/25/15 0941  . HYDROcodone-acetaminophen (NORCO/VICODIN) 5-325 MG per tablet 1-2 tablet  1-2 tablet Oral Q6H PRN Briscoe Deutscherimothy S Opyd, MD   1 tablet at 09/24/15 2156  . ibuprofen (ADVIL,MOTRIN) tablet 600 mg  600 mg Oral Q6H PRN Joseph ArtJessica U Vann, DO      . levothyroxine (SYNTHROID, LEVOTHROID) tablet  75 mcg  75 mcg Oral QAC breakfast Briscoe Deutscherimothy S Opyd, MD   75 mcg at 09/25/15 0759  . lisinopril (PRINIVIL,ZESTRIL) tablet 10 mg  10 mg Oral Daily Briscoe Deutscherimothy S Opyd, MD   10 mg at 09/25/15 0942  . ondansetron (ZOFRAN) tablet 4 mg  4 mg Oral Q6H PRN Briscoe Deutscherimothy S Opyd, MD       Or  . ondansetron (ZOFRAN) injection 4 mg  4 mg Intravenous Q6H PRN Briscoe Deutscherimothy S Opyd, MD      . pantoprazole (PROTONIX) EC tablet 40 mg  40 mg Oral Daily Briscoe Deutscherimothy S Opyd, MD   40 mg at 09/25/15 0942  . polyethylene glycol (MIRALAX / GLYCOLAX) packet 17 g  17 g Oral Daily PRN Briscoe Deutscherimothy S Opyd, MD      . pravastatin (PRAVACHOL) tablet 20 mg  20 mg Oral QHS Briscoe Deutscherimothy S Opyd, MD   20 mg at 09/24/15 2156  . pregabalin (LYRICA) capsule 100 mg  100 mg Oral BID PRN Briscoe Deutscherimothy S Opyd, MD   100 mg at 09/24/15 0354  . traZODone (DESYREL) tablet 300 mg  300 mg Oral QHS Briscoe Deutscherimothy S Opyd, MD   300 mg at 09/24/15 2156     Discharge Medications: Please see discharge summary for a list of discharge medications.  Relevant Imaging Results:  Relevant Lab Results:   Additional Information SS#: [id number]  Burna SisUris, Yaiden Yang H, LCSW

## 2015-09-25 NOTE — Progress Notes (Signed)
PROGRESS NOTE    Lindsey Rowe  ZOX:[id number] DOB: April 05, 1945 DOA: 09/23/2015 PCP: Astrid Divine, MD   Outpatient Specialists:     Brief Narrative:  Lindsey Rowe is a 70 y.o. female with medical history significant for depression, GERD, hypertension, hypothyroidism, and chronic low back pain with radiculopathy status post multilevel lumbar decompression who presents to the emergency department with weakness and acute on chronic pain in the left ankle following a fall at home. Patient reports progressive weakness over the past week. At baseline, she ambulates with a cane or walker, but has been experiencing increasing difficulty with this and suffered a ground-level mechanical fall at home on 09/23/2015 without head strike or loss of consciousness. She reports being too weak to get up following the fall and laid on the floor for 11 hours before calling EMS. EMS arrived and helped the patient to her couch but she called them back out later in the day because she was too weak to get up from the couch. She denies any recent fevers or chills, denies chest pain or palpitations, and denies dyspnea or cough. She suffers from migraines but is had no unusual headache, change in vision or hearing, loss of coordination, or focal numbness or weakness. She reports that her brother, the "only family I have," was diagnosed with a brain mass which has been resected and he remains hospitalized. She has been more depressed surrounding her brother's illness and reports extremely poor oral intake for the past week or more. She denies homicidal or suicidal ideation and denies hallucinations. She lives alone.    Assessment & Plan:   Principal Problem:   Generalized weakness Active Problems:   BIPOLAR AFFECTIVE DISORDER   Shoulder pain, right   Low back pain potentially associated with radiculopathy   Hypothyroid   Leg pain, bilateral   Depression   Loss of appetite   Leg weakness,  bilateral   Generalized weakness - Fell on day of admission and had to call EMS to help her up; they helped her to couch, but she had to call them back later because she couldn't get up from the couch  - Likely multifactorial - Pt reports very poor appetite since learning of her brother's brain tumor recently - Depression likely contributing  - Urine is neg for infection  - PT eval requested- SNF  B/l ankle pain - Radiographs negative for acute fracture or dislocation  - RICE - Pain-control with prn's   -not gout, ? Arthritis-- add burst of steroids -outpatient neurology follow up for nerve conduction? ?fibromyagia -duplex negative for DVT  Depression, bipolar affective disorder   - Depression has been exacerbated by her brother's recent brain tumor diagnosis   - She denies SI, HI, or hallucinations  - Continue Cymbalta and trazodone    Hypothyroidism  - TSH ok - Continue current-dose Synthroid for now    Leukocytosis  - no fever or apparent focus of infection - Culture if febrile, but watch off abx for now     DVT prophylaxis:  Lovenox   Code Status: Full Code   Family Communication: patient  Disposition Plan:  SNF   Consultants:     Procedures:         Subjective: Still with ankle pain  Objective: Vitals:   09/24/15 1039 09/24/15 1400 09/24/15 2211 09/25/15 0605  BP: 116/60 118/62 106/71 (!) 90/53  Pulse: 98 96 (!) 105 86  Resp:   18 18  Temp:  98.6 F (37 C)  98.4 F (36.9 C) 98 F (36.7 C)  TempSrc:   Oral Oral  SpO2: 99%  97% 96%  Weight:      Height:        Intake/Output Summary (Last 24 hours) at 09/25/15 1053 Last data filed at 09/24/15 2330  Gross per 24 hour  Intake                0 ml  Output              950 ml  Net             -950 ml   Filed Weights   09/24/15 0243  Weight: 120.2 kg (265 lb)    Examination:  General exam: Appears calm and comfortable  Respiratory system: Clear to auscultation. Respiratory  effort normal. Cardiovascular system: S1 & S2 heard, RRR. No JVD, murmurs, rubs, gallops or clicks. No pedal edema. Gastrointestinal system: Abdomen is nondistended, soft and nontender. No organomegaly or masses felt. Normal bowel sounds heard. Central nervous system: Alert and oriented. No focal neurological deficits. Extremities: Symmetric 5 x 5 power. Skin: No rashes, lesions or ulcers- b/l feet swelling R>L Psychiatry: Judgement and insight appear normal. Mood & affect appropriate.     Data Reviewed: I have personally reviewed following labs and imaging studies  CBC:  Recent Labs Lab 09/23/15 1908 09/25/15 0700  WBC 13.4* 10.0  NEUTROABS 10.1*  --   HGB 13.3 11.4*  HCT 41.7 37.1  MCV 95.6 99.2  PLT 282 260   Basic Metabolic Panel:  Recent Labs Lab 09/23/15 1908 09/25/15 0700  NA 137 140  K 4.3 3.7  CL 99* 102  CO2 31 33*  GLUCOSE 112* 116*  BUN 13 13  CREATININE 0.81 0.77  CALCIUM 9.1 8.8*   GFR: Estimated Creatinine Clearance: 85.2 mL/min (by C-G formula based on SCr of 0.8 mg/dL). Liver Function Tests:  Recent Labs Lab 09/23/15 1908  AST 21  ALT 10*  ALKPHOS 78  BILITOT 1.2  PROT 7.4  ALBUMIN 3.9   No results for input(s): LIPASE, AMYLASE in the last 168 hours. No results for input(s): AMMONIA in the last 168 hours. Coagulation Profile: No results for input(s): INR, PROTIME in the last 168 hours. Cardiac Enzymes:  Recent Labs Lab 09/23/15 1908  CKTOTAL 94   BNP (last 3 results) No results for input(s): PROBNP in the last 8760 hours. HbA1C: No results for input(s): HGBA1C in the last 72 hours. CBG: No results for input(s): GLUCAP in the last 168 hours. Lipid Profile: No results for input(s): CHOL, HDL, LDLCALC, TRIG, CHOLHDL, LDLDIRECT in the last 72 hours. Thyroid Function Tests:  Recent Labs  09/24/15 0440  TSH 3.287  FREET4 1.20*   Anemia Panel:  Recent Labs  09/24/15 0440  VITAMINB12 579   Urine analysis:    Component  Value Date/Time   COLORURINE YELLOW 09/23/2015 2123   APPEARANCEUR CLEAR 09/23/2015 2123   LABSPEC 1.013 09/23/2015 2123   PHURINE 5.5 09/23/2015 2123   GLUCOSEU NEGATIVE 09/23/2015 2123   HGBUR NEGATIVE 09/23/2015 2123   HGBUR trace-lysed 09/23/2009 0000   BILIRUBINUR NEGATIVE 09/23/2015 2123   KETONESUR NEGATIVE 09/23/2015 2123   PROTEINUR NEGATIVE 09/23/2015 2123   UROBILINOGEN 1.0 08/13/2012 1757   NITRITE POSITIVE (A) 09/23/2015 2123   LEUKOCYTESUR NEGATIVE 09/23/2015 2123     )No results found for this or any previous visit (from the past 240 hour(s)).    Anti-infectives    None  Radiology Studies: Dg Chest 2 View  Result Date: 09/23/2015 CLINICAL DATA:  Acute onset of generalized weakness, status post fall. Initial encounter. EXAM: CHEST  2 VIEW COMPARISON:  Chest radiograph performed 12/28/2012 FINDINGS: There is elevation of the right hemidiaphragm, with mild bilateral atelectasis. No pleural effusion or pneumothorax is seen. The heart is normal in size. No acute osseous abnormalities are seen. Clips are noted about the gastroesophageal junction. IMPRESSION: Elevation of the right hemidiaphragm, with mild bilateral atelectasis. Electronically Signed   By: Roanna RaiderJeffery  Chang M.D.   On: 09/23/2015 23:35   Dg Shoulder Right  Result Date: 09/23/2015 CLINICAL DATA:  Fall today.  Right shoulder pain. EXAM: RIGHT SHOULDER - 2+ VIEW COMPARISON:  None. FINDINGS: The right shoulder is located. No acute fracture is present. Moderate degenerative changes are present at the [facility]. The clavicle is intact. The scapula is intact. The visualized right hemi thorax is clear. IMPRESSION: 1. Moderate degenerative changes at the right [facility]. 2. No acute abnormality. Electronically Signed   By: Marin Robertshristopher  Mattern M.D.   On: 09/23/2015 19:53   Dg Ankle Complete Left  Result Date: 09/23/2015 CLINICAL DATA:  Fall today.  Left foot and ankle pain. EXAM: LEFT ANKLE COMPLETE - 3+ VIEW  COMPARISON:  Left ankle radiograph 08/22/2012 FINDINGS: Mild soft tissue swelling is present over the medial malleolus. There is no underlying fracture. Chronic irregularity at the medial malleolus is stable. IMPRESSION: Soft tissue swelling over the medial malleolus without underlying fracture. Electronically Signed   By: Marin Robertshristopher  Mattern M.D.   On: 09/23/2015 19:58   Mr Thoracic Spine Wo Contrast  Result Date: 09/23/2015 CLINICAL DATA:  Patient with BILATERAL chronic leg pain. Patient fell and was on the ground for several hours. Weaker than usual and cannot get around. Previous lumbar surgery. EXAM: MRI THORACIC AND LUMBAR SPINE WITHOUT CONTRAST TECHNIQUE: Multiplanar and multiecho pulse sequences of the thoracic and lumbar spine were obtained without intravenous contrast. COMPARISON:  None. FINDINGS: MRI THORACIC SPINE FINDINGS Alignment:  Anatomic Segmentation: Previous lumbar spine MRIs are available and correlate with 5 lumbar-type vertebrae. Counting down the odontoid, there are only 11 thoracic vertebrae if numeric assignment of 5 lumbar vertebrae is established. The previous nomenclature will be continued. The last thoracolumbar disc space will be labeled T11-L1. Vertebrae: No worrisome osseous lesion.  No compression fracture. Cord: Multiple areas of stenosis without significant cord compression, most notable at T10-11. No abnormal cord signal. Paraspinal and other soft tissues: Unremarkable. Disc levels: He had The individual disc spaces are examined as follows: T1-2:  Unremarkable. T2-3:  Unremarkable. T3-4:  Unremarkable. T4-5:  Annular bulge. T5-6:  Unremarkable. T6-7:  Shallow central protrusion.  No impingement. T7-8:  Annular bulge. T8-9: Central protrusion. Posterior element hypertrophy. No impingement. T9-10: Disc space narrowing. Annular bulge. Posterior element hypertrophy. No impingement. T10-11: Central protrusion. Posterior element hypertrophy. Moderate stenosis. No frank cord  compression or abnormal cord signal. T11-L1: Central protrusion. Posterior element hypertrophy. No impingement. MRI LUMBAR SPINE FINDINGS Segmentation: Abnormal thoracic segmentation. LEFT Thoracolumbar interspace labeled T11-L1. Alignment:  Anatomic Vertebrae:  Degenerative change.  No acute osseous findings. Conus medullaris: Extends to the L1 level and appears normal. Paraspinal and other soft tissues: Unremarkable. Disc levels: L1-L2: Central and leftward protrusion. Posterior element hypertrophy. LEFT subarticular zone narrowing could affect the L2 nerve root. L2-L3: Central protrusion. Posterior element hypertrophy. Mild stenosis. Either L3 nerve root could be affected. L3-L4: Central protrusion. Prior laminectomy with pseudomeningocele. No residual impingement. L4-L5: Advanced disc space narrowing.  Central laminectomy. Dorsal pseudomeningocele. No stenosis. No residual impingement. L5-S1: Central and rightward protrusion. Posterior element hypertrophy. RIGHT greater than LEFT subarticular zone and foraminal zone narrowing could affect the L5 and S1 nerve roots. IMPRESSION: MR THORACIC SPINE IMPRESSION Segmentation anomaly.  Only 11 thoracic vertebrae are reported. No thoracic compression fracture. No intraspinal mass lesion, significant cord compression, or abnormal cord signal. Multilevel spondylosis, worst at T10-11. MR LUMBAR SPINE IMPRESSION No lumbar compression fracture or significant stenosis. Previous lumbar decompression at L3-4 and L4-5 without residual impingement. Central and rightward disc protrusion at L5-S1 in association with posterior element hypertrophy could affect the RIGHT greater than LEFT L5 and S1 nerve roots. Electronically Signed   By: Elsie Stain M.D.   On: 09/23/2015 21:24   Mr Lumbar Spine Wo Contrast  Result Date: 09/23/2015 CLINICAL DATA:  Patient with BILATERAL chronic leg pain. Patient fell and was on the ground for several hours. Weaker than usual and cannot get around.  Previous lumbar surgery. EXAM: MRI THORACIC AND LUMBAR SPINE WITHOUT CONTRAST TECHNIQUE: Multiplanar and multiecho pulse sequences of the thoracic and lumbar spine were obtained without intravenous contrast. COMPARISON:  None. FINDINGS: MRI THORACIC SPINE FINDINGS Alignment:  Anatomic Segmentation: Previous lumbar spine MRIs are available and correlate with 5 lumbar-type vertebrae. Counting down the odontoid, there are only 11 thoracic vertebrae if numeric assignment of 5 lumbar vertebrae is established. The previous nomenclature will be continued. The last thoracolumbar disc space will be labeled T11-L1. Vertebrae: No worrisome osseous lesion.  No compression fracture. Cord: Multiple areas of stenosis without significant cord compression, most notable at T10-11. No abnormal cord signal. Paraspinal and other soft tissues: Unremarkable. Disc levels: He had The individual disc spaces are examined as follows: T1-2:  Unremarkable. T2-3:  Unremarkable. T3-4:  Unremarkable. T4-5:  Annular bulge. T5-6:  Unremarkable. T6-7:  Shallow central protrusion.  No impingement. T7-8:  Annular bulge. T8-9: Central protrusion. Posterior element hypertrophy. No impingement. T9-10: Disc space narrowing. Annular bulge. Posterior element hypertrophy. No impingement. T10-11: Central protrusion. Posterior element hypertrophy. Moderate stenosis. No frank cord compression or abnormal cord signal. T11-L1: Central protrusion. Posterior element hypertrophy. No impingement. MRI LUMBAR SPINE FINDINGS Segmentation: Abnormal thoracic segmentation. LEFT Thoracolumbar interspace labeled T11-L1. Alignment:  Anatomic Vertebrae:  Degenerative change.  No acute osseous findings. Conus medullaris: Extends to the L1 level and appears normal. Paraspinal and other soft tissues: Unremarkable. Disc levels: L1-L2: Central and leftward protrusion. Posterior element hypertrophy. LEFT subarticular zone narrowing could affect the L2 nerve root. L2-L3: Central  protrusion. Posterior element hypertrophy. Mild stenosis. Either L3 nerve root could be affected. L3-L4: Central protrusion. Prior laminectomy with pseudomeningocele. No residual impingement. L4-L5: Advanced disc space narrowing. Central laminectomy. Dorsal pseudomeningocele. No stenosis. No residual impingement. L5-S1: Central and rightward protrusion. Posterior element hypertrophy. RIGHT greater than LEFT subarticular zone and foraminal zone narrowing could affect the L5 and S1 nerve roots. IMPRESSION: MR THORACIC SPINE IMPRESSION Segmentation anomaly.  Only 11 thoracic vertebrae are reported. No thoracic compression fracture. No intraspinal mass lesion, significant cord compression, or abnormal cord signal. Multilevel spondylosis, worst at T10-11. MR LUMBAR SPINE IMPRESSION No lumbar compression fracture or significant stenosis. Previous lumbar decompression at L3-4 and L4-5 without residual impingement. Central and rightward disc protrusion at L5-S1 in association with posterior element hypertrophy could affect the RIGHT greater than LEFT L5 and S1 nerve roots. Electronically Signed   By: Elsie Stain M.D.   On: 09/23/2015 21:24   Dg Foot Complete Left  Result Date: 09/23/2015 CLINICAL  DATA:  Fall today.  Left ankle and foot pain. EXAM: LEFT FOOT - COMPLETE 3+ VIEW COMPARISON:  Left ankle and foot radiographs 08/22/2012 FINDINGS: Progressive osteopenia is present. No acute bone or soft tissue abnormalities are present. Calcaneal spurs are stable. IMPRESSION: 1. No acute osseous abnormality. 2. Progressive osteopenia. Electronically Signed   By: Marin Roberts M.D.   On: 09/23/2015 19:54        Scheduled Meds: . cholecalciferol  1,000 Units Oral Daily  . DULoxetine  30 mg Oral Daily  . DULoxetine  60 mg Oral QPM  . enoxaparin (LOVENOX) injection  40 mg Subcutaneous Q24H  . levothyroxine  75 mcg Oral QAC breakfast  . pantoprazole  40 mg Oral Daily  . pravastatin  20 mg Oral QHS  .  predniSONE  50 mg Oral Q breakfast  . traZODone  300 mg Oral QHS   Continuous Infusions:    LOS: 0 days    Time spent: 35 min    Saurabh Hettich Juanetta Gosling, DO [facility] Pager [phone]  If 7PM-7AM, please contact night-coverage [email or web address] Password [facility] 09/25/2015, 10:53 AM

## 2015-09-25 NOTE — Clinical Social Work Note (Signed)
Clinical Social Work Assessment  Patient Details  Name: Lindsey Rowe MRN: [id number] Date of Birth: 1945-04-22  Date of referral:  09/25/15               Reason for consult:  Abuse/Neglect, Facility Placement                Permission sought to share information with:  Family Supports Permission granted to share information::  Yes, Verbal Permission Granted  Name::        [facility]ency[facility]  Relationship::     Contact Information:     Housing/Transportation Living arrangements for the past 2 months:  Apartment Source of Information:  Patient Patient Interpreter Needed:  None Criminal Activity/Legal Involvement Pertinent to Current Situation/Hospitalization:  No - Comment as needed Significant Relationships:  Other(Comment) (caregiver) Lives with:  Self Do you feel safe going back to the place where you live?  No Need for family participation in patient care:  No (Coment) (pt normally living independently)  Care giving concerns:  Pt lives at home alone- has caregiver that comes in 1 hour a day during the week- no family who live locally (only family lives in [place])   Office managerocial Worker assessment / plan:  CSW spoke with pt concerning PT recommendation for SNF at time of DC.  CSW discussed SNF recommendation with pt in detail (explained facility set up and referral process) and purpose to increase mobility.  Pt reports that she has has intermediate foot pain over the past 3 years but it has become increasingly worse over the past 2 weeks (pt states she has been unable to walk for 1 week).  Pt states that at baseline she is independent but that she was unable to feed or care for herself properly the past 2 weeks due to increase in pain- pt had some assistance from a caregiver but not sufficient for ADLs.  Employment status:  Retired Database administratornsurance information:  Managed Medicare PT Recommendations:  Skilled Nursing Facility Information / Referral to community resources:  Skilled  Nursing Facility  Patient/Family's Response to care:  Pt very hesitant about going to SNF- acknowledges that she can't care for herself but is worried if she goes to SNF she will end up being stuck there- is also anxious about going for rehab when they have yet to confirm what her medical issues are.  Patient/Family's Understanding of and Emotional Response to Diagnosis, Current Treatment, and Prognosis:  Pt is worried about current medical state and anxious that it might not improve- feels as if she is not getting sufficient medical updates.  Emotional Assessment Appearance:  Appears stated age Attitude/Demeanor/Rapport:    Affect (typically observed):  Appropriate, Apprehensive, Pleasant Orientation:  Oriented to Situation, Oriented to  Time, Oriented to Place, Oriented to Self Alcohol / Substance use:  Not Applicable Psych involvement (Current and /or in the community):     Discharge Needs  Concerns to be addressed:  Care Coordination Readmission within the last 30 days:  No Current discharge risk:  Physical Impairment, Lack of support system Barriers to Discharge:  Continued Medical Work up   Burna SisUris, Marquice Uddin H, LCSW 09/25/2015, 10:27 AM

## 2015-09-26 DIAGNOSIS — E038 Other specified hypothyroidism: Secondary | ICD-10-CM | POA: Diagnosis not present

## 2015-09-26 DIAGNOSIS — F319 Bipolar disorder, unspecified: Secondary | ICD-10-CM | POA: Diagnosis not present

## 2015-09-26 DIAGNOSIS — F329 Major depressive disorder, single episode, unspecified: Secondary | ICD-10-CM | POA: Diagnosis not present

## 2015-09-26 DIAGNOSIS — R531 Weakness: Secondary | ICD-10-CM | POA: Diagnosis not present

## 2015-09-26 DIAGNOSIS — M79604 Pain in right leg: Secondary | ICD-10-CM | POA: Diagnosis not present

## 2015-09-26 LAB — GLUCOSE, CAPILLARY: Glucose-Capillary: 137 mg/dL — ABNORMAL HIGH (ref 65–99)

## 2015-09-26 MED ORDER — PREGABALIN 50 MG PO CAPS
100.0000 mg | ORAL_CAPSULE | Freq: Two times a day (BID) | ORAL | Status: DC
  Administered 2015-09-26 – 2015-09-27 (×2): 100 mg via ORAL
  Filled 2015-09-26 (×2): qty 2

## 2015-09-26 NOTE — Clinical Social Work Note (Addendum)
Sartell MUST PASARR obtained [id number]  [facility]. HTA authorization initiated.   MSW remains available as needed.   Derenda FennelBashira Amarri Satterly, MSW [phone] 09/26/2015 12:45 PM

## 2015-09-26 NOTE — Progress Notes (Signed)
PROGRESS NOTE    Lindsey Rowe  ZOX:[id number] DOB: [birth year] DOA: 09/23/2015 PCP: Astrid DivineGRIFFIN,ELAINE COLLINS, MD   Outpatient Specialists:     Brief Narrative:  Lindsey Rowe is a 70 y.o. female with medical history significant for depression, GERD, hypertension, hypothyroidism, and chronic low back pain with radiculopathy status post multilevel lumbar decompression who presents to the emergency department with weakness and acute on chronic pain in the left ankle following a fall at home. Patient reports progressive weakness over the past week. At baseline, she ambulates with a cane or walker, but has been experiencing increasing difficulty with this and suffered a ground-level mechanical fall at home on 09/23/2015 without head strike or loss of consciousness. She reports being too weak to get up following the fall and laid on the floor for 11 hours before calling EMS. EMS arrived and helped the patient to her couch but she called them back out later in the day because she was too weak to get up from the couch. She denies any recent fevers or chills, denies chest pain or palpitations, and denies dyspnea or cough. She suffers from migraines but is had no unusual headache, change in vision or hearing, loss of coordination, or focal numbness or weakness. She reports that her brother, the "only family I have," was diagnosed with a brain mass which has been resected and he remains hospitalized. She has been more depressed surrounding her brother's illness and reports extremely poor oral intake for the past week or more. She denies homicidal or suicidal ideation and denies hallucinations. She lives alone.    Assessment & Plan:   Principal Problem:   Generalized weakness Active Problems:   BIPOLAR AFFECTIVE DISORDER   Shoulder pain, right   Low back pain potentially associated with radiculopathy   Hypothyroid   Leg pain, bilateral   Depression   Loss of appetite   Leg weakness,  bilateral   Generalized weakness - Fell on day of admission and had to call EMS to help her up; they helped her to couch, but she had to call them back later because she couldn't get up from the couch  - Likely multifactorial - Pt reports very poor appetite since learning of her brother's brain tumor recently - Depression likely contributing  - Urine is neg for infection  - PT eval requested- SNF  B/l ankle pain - Radiographs negative for acute fracture or dislocation  - RICE - Pain-control with prn's   -not gout, ? Arthritis-- add burst of steroids -outpatient neurology follow up for nerve conduction? ?fibromyagia -duplex negative for DVT  Depression, bipolar affective disorder   - Depression has been exacerbated by her brother's recent brain tumor diagnosis   - She denies SI, HI, or hallucinations  - Continue Cymbalta and trazodone    Hypothyroidism  - TSH ok - Continue current-dose Synthroid for now    Leukocytosis  - no fever or apparent focus of infection - Culture if febrile, but watch off abx for now     DVT prophylaxis:  Lovenox   Code Status: Full Code   Family Communication: patient  Disposition Plan:  SNF when bed available   Consultants:     Procedures:         Subjective: Ankle still swollen on right but left better  Objective: Vitals:   09/25/15 0605 09/25/15 1434 09/25/15 2133 09/26/15 0645  BP: (!) 90/53 110/64 136/80 (!) 143/73  Pulse: 86 (!) 107 93 76  Resp: 18 18 18  18  Temp: 98 F (36.7 C) 98.6 F (37 C) 98.3 F (36.8 C) 97.4 F (36.3 C)  TempSrc: Oral Oral Oral Oral  SpO2: 96% 90% 94% 97%  Weight:      Height:        Intake/Output Summary (Last 24 hours) at 09/26/15 1234 Last data filed at 09/26/15 1000  Gross per 24 hour  Intake              960 ml  Output             1000 ml  Net              -40 ml   Filed Weights   09/24/15 0243  Weight: 120.2 kg (265 lb)    Examination:  General exam: Appears  calm and comfortable  Respiratory system: Clear to auscultation. Respiratory effort normal. Cardiovascular system: S1 & S2 heard, RRR. No JVD, murmurs, rubs, gallops or clicks. +LE edema- minimal in right foot Gastrointestinal system: Abdomen is nondistended, soft and nontender. No organomegaly or masses felt. Normal bowel sounds heard. Central nervous system: Alert and oriented. No focal neurological deficits.     Data Reviewed: I have personally reviewed following labs and imaging studies  CBC:  Recent Labs Lab 09/23/15 1908 09/25/15 0700  WBC 13.4* 10.0  NEUTROABS 10.1*  --   HGB 13.3 11.4*  HCT 41.7 37.1  MCV 95.6 99.2  PLT 282 260   Basic Metabolic Panel:  Recent Labs Lab 09/23/15 1908 09/25/15 0700  NA 137 140  K 4.3 3.7  CL 99* 102  CO2 31 33*  GLUCOSE 112* 116*  BUN 13 13  CREATININE 0.81 0.77  CALCIUM 9.1 8.8*   GFR: Estimated Creatinine Clearance: 85.2 mL/min (by C-G formula based on SCr of 0.8 mg/dL). Liver Function Tests:  Recent Labs Lab 09/23/15 1908  AST 21  ALT 10*  ALKPHOS 78  BILITOT 1.2  PROT 7.4  ALBUMIN 3.9   No results for input(s): LIPASE, AMYLASE in the last 168 hours. No results for input(s): AMMONIA in the last 168 hours. Coagulation Profile: No results for input(s): INR, PROTIME in the last 168 hours. Cardiac Enzymes:  Recent Labs Lab 09/23/15 1908  CKTOTAL 94   BNP (last 3 results) No results for input(s): PROBNP in the last 8760 hours. HbA1C: No results for input(s): HGBA1C in the last 72 hours. CBG:  Recent Labs Lab 09/26/15 0758  GLUCAP 137*   Lipid Profile: No results for input(s): CHOL, HDL, LDLCALC, TRIG, CHOLHDL, LDLDIRECT in the last 72 hours. Thyroid Function Tests:  Recent Labs  09/24/15 0440  TSH 3.287  FREET4 1.20*   Anemia Panel:  Recent Labs  09/24/15 0440  VITAMINB12 579   Urine analysis:    Component Value Date/Time   COLORURINE YELLOW 09/23/2015 2123   APPEARANCEUR CLEAR  09/23/2015 2123   LABSPEC 1.013 09/23/2015 2123   PHURINE 5.5 09/23/2015 2123   GLUCOSEU NEGATIVE 09/23/2015 2123   HGBUR NEGATIVE 09/23/2015 2123   HGBUR trace-lysed 09/23/2009 0000   BILIRUBINUR NEGATIVE 09/23/2015 2123   KETONESUR NEGATIVE 09/23/2015 2123   PROTEINUR NEGATIVE 09/23/2015 2123   UROBILINOGEN 1.0 08/13/2012 1757   NITRITE POSITIVE (A) 09/23/2015 2123   LEUKOCYTESUR NEGATIVE 09/23/2015 2123     )No results found for this or any previous visit (from the past 240 hour(s)).    Anti-infectives    None       Radiology Studies: No results found.  Scheduled Meds: . cholecalciferol  1,000 Units Oral Daily  . DULoxetine  30 mg Oral Daily  . DULoxetine  60 mg Oral QPM  . enoxaparin (LOVENOX) injection  40 mg Subcutaneous Q24H  . levothyroxine  75 mcg Oral QAC breakfast  . pantoprazole  40 mg Oral Daily  . pravastatin  20 mg Oral QHS  . predniSONE  50 mg Oral Q breakfast  . traZODone  300 mg Oral QHS   Continuous Infusions:    LOS: 0 days    Time spent: 25 min    Alix Lahmann Juanetta Gosling, DO [facility] Pager [phone]  If 7PM-7AM, please contact night-coverage [email or web address] Password [facility] 09/26/2015, 12:34 PM

## 2015-09-26 NOTE — Plan of Care (Signed)
Problem: Activity: Goal: Risk for activity intolerance will decrease Outcome: Not Met (add Reason) Pt unable to stand

## 2015-09-26 NOTE — Clinical Social Work Note (Signed)
HTA supervisor reviewing case for SNF authorization.  MSW remains available as needed.   Derenda FennelBashira Zaeda Mcferran, MSW [phone] 09/26/2015 2:27 PM

## 2015-09-26 NOTE — Progress Notes (Signed)
Physical Therapy Treatment Patient Details Name: Lindsey DupesDeborah C Rowe MRN: [id number] DOB: [birth year] Today's Date: 09/26/2015    History of Present Illness Pt admitted through ED with Bilat foot pain s/p falls at home.  Pt with hx of back pain s/p laminectomy (14), Bipolar and chronic Bilat LE pain and weakness    PT Comments    Pt reports decreased sensation to light touch R foot as well as sharp, shooting pain. We attempted sit to stand x 3 with RW and +2 assist, she was unable to tolerate standing more than ~3 seconds due to pain in R foot.   Follow Up Recommendations  SNF     Equipment Recommendations  None recommended by PT    Recommendations for Other Services OT consult     Precautions / Restrictions Precautions Precautions: Fall Restrictions Weight Bearing Restrictions: No    Mobility  Bed Mobility Overal bed mobility: Needs Assistance;+2 for physical assistance       Supine to sit: HOB elevated;Min guard Sit to supine: +2 for physical assistance;Mod assist   General bed mobility comments: Assist with bilat LEs into bed  Transfers Overall transfer level: Needs assistance Equipment used: Rolling walker (2 wheeled) Transfers: Sit to/from Stand Sit to Stand: Mod assist;+2 physical assistance;+2 safety/equipment;From elevated surface         General transfer comment: attempted sit to stand x 3 from elevated bed with RW, unable to tolerate standing more than ~3 seconds due to R foot pain  Ambulation/Gait                 Stairs            Wheelchair Mobility    Modified Rankin (Stroke Patients Only)       Balance     Sitting balance-Leahy Scale: Good       Standing balance-Leahy Scale: Poor                      Cognition Arousal/Alertness: Awake/alert Behavior During Therapy: WFL for tasks assessed/performed Overall Cognitive Status: Within Functional Limits for tasks assessed                      Exercises       General Comments        Pertinent Vitals/Pain Faces Pain Scale: Hurts worst Pain Location: R foot Pain Descriptors / Indicators: Burning Pain Intervention(s): Limited activity within patient's tolerance;Monitored during session;Patient requesting pain meds-RN notified    Home Living                      Prior Function            PT Goals (current goals can now be found in the care plan section) Acute Rehab PT Goals Patient Stated Goal: less pain, figure out what's wrong with her feet PT Goal Formulation: With patient Time For Goal Achievement: 10/08/15 Potential to Achieve Goals: Fair Progress towards PT goals: Not progressing toward goals - comment (pain limiting progress)    Frequency  Min 3X/week    PT Plan Current plan remains appropriate    Co-evaluation             End of Session Equipment Utilized During Treatment: Gait belt Activity Tolerance: Patient limited by pain Patient left: in bed     Time: 1123-1145 PT Time Calculation (min) (ACUTE ONLY): 22 min  Charges:  $Therapeutic Activity: 8-22 mins  G Codes:      Lindsey Rowe, Lindsey Rowe 09/26/2015, 12:43 PM [phone]

## 2015-09-27 DIAGNOSIS — M6281 Muscle weakness (generalized): Secondary | ICD-10-CM | POA: Diagnosis not present

## 2015-09-27 DIAGNOSIS — M79661 Pain in right lower leg: Secondary | ICD-10-CM | POA: Diagnosis not present

## 2015-09-27 DIAGNOSIS — R609 Edema, unspecified: Secondary | ICD-10-CM | POA: Diagnosis not present

## 2015-09-27 DIAGNOSIS — M79604 Pain in right leg: Secondary | ICD-10-CM | POA: Diagnosis not present

## 2015-09-27 DIAGNOSIS — M79662 Pain in left lower leg: Secondary | ICD-10-CM | POA: Diagnosis not present

## 2015-09-27 DIAGNOSIS — R278 Other lack of coordination: Secondary | ICD-10-CM | POA: Diagnosis not present

## 2015-09-27 DIAGNOSIS — M549 Dorsalgia, unspecified: Secondary | ICD-10-CM | POA: Diagnosis not present

## 2015-09-27 DIAGNOSIS — R531 Weakness: Secondary | ICD-10-CM | POA: Diagnosis not present

## 2015-09-27 DIAGNOSIS — R262 Difficulty in walking, not elsewhere classified: Secondary | ICD-10-CM | POA: Diagnosis not present

## 2015-09-27 DIAGNOSIS — Z9181 History of falling: Secondary | ICD-10-CM | POA: Diagnosis not present

## 2015-09-27 DIAGNOSIS — R2681 Unsteadiness on feet: Secondary | ICD-10-CM | POA: Diagnosis not present

## 2015-09-27 DIAGNOSIS — F319 Bipolar disorder, unspecified: Secondary | ICD-10-CM | POA: Diagnosis not present

## 2015-09-27 DIAGNOSIS — E038 Other specified hypothyroidism: Secondary | ICD-10-CM | POA: Diagnosis not present

## 2015-09-27 DIAGNOSIS — F329 Major depressive disorder, single episode, unspecified: Secondary | ICD-10-CM | POA: Diagnosis not present

## 2015-09-27 LAB — GLUCOSE, CAPILLARY: Glucose-Capillary: 87 mg/dL (ref 65–99)

## 2015-09-27 MED ORDER — HYDROCODONE-ACETAMINOPHEN 5-325 MG PO TABS: 1.0000 | ORAL_TABLET | Freq: Four times a day (QID) | ORAL | 0 refills | Status: DC | PRN

## 2015-09-27 MED ORDER — PREDNISONE 50 MG PO TABS: ORAL_TABLET | ORAL | Status: DC

## 2015-09-27 MED ORDER — PREGABALIN 100 MG PO CAPS: 100.0000 mg | ORAL_CAPSULE | Freq: Two times a day (BID) | ORAL | Status: DC

## 2015-09-27 MED ORDER — BISACODYL 5 MG PO TBEC: 5.0000 mg | DELAYED_RELEASE_TABLET | Freq: Every day | ORAL | 0 refills | Status: DC | PRN

## 2015-09-27 MED ORDER — POLYETHYLENE GLYCOL 3350 17 G PO PACK: 17.0000 g | PACK | Freq: Every day | ORAL | 0 refills | Status: DC | PRN

## 2015-09-27 NOTE — Discharge Summary (Signed)
Physician Discharge Summary  Lindsey Rowe:[id number] DOB: 25-Jun-1945 DOA: 09/23/2015  PCP: Astrid Divine, MD  Admit date: 09/23/2015 Discharge date: 09/27/2015   Recommendations for Outpatient Follow-Up:   To SNF -steroid taper, if pain not improved, outpatient nrueo follow up for nerve conduction study  Discharge Diagnosis:   Principal Problem:   Generalized weakness Active Problems:   BIPOLAR AFFECTIVE DISORDER   Shoulder pain, right   Low back pain potentially associated with radiculopathy   Hypothyroid   Leg pain, bilateral   Depression   Loss of appetite   Leg weakness, bilateral   Discharge disposition:  SNF:  Discharge Condition: Improved.  Diet recommendation: carb mod while on steroids  Wound care: None.   History of Present Illness:   Lindsey Rowe is a 70 y.o. female with medical history significant for depression, GERD, hypertension, hypothyroidism, and chronic low back pain with radiculopathy status post multilevel lumbar decompression who presents to the emergency department with weakness and acute on chronic pain in the left ankle following a fall at home. Patient reports progressive weakness over the past week. At baseline, she ambulates with a cane or walker, but has been experiencing increasing difficulty with this and suffered a ground-level mechanical fall at home on 09/23/2015 without head strike or loss of consciousness. She reports being too weak to get up following the fall and laid on the floor for 11 hours before calling EMS. EMS arrived and helped the patient to her couch but she called them back out later in the day because she was too weak to get up from the couch. She denies any recent fevers or chills, denies chest pain or palpitations, and denies dyspnea or cough. She suffers from migraines but is had no unusual headache, change in vision or hearing, loss of coordination, or focal numbness or weakness. She reports that her  brother, the "only family I have," was diagnosed with a brain mass which has been resected and he remains hospitalized. She has been more depressed surrounding her brother's illness and reports extremely poor oral intake for the past week or more. She denies homicidal or suicidal ideation and denies hallucinations. She lives alone.   Hospital Course by Problem:   Generalized weakness - Fell on day of admission and had to call EMS to help her up; they helped her to couch, but she had to call them back later because she couldn't get up from the couch  - Likely multifactorial - Pt reports very poor appetite since learning of her brother's brain tumor recently - Depression likely contributing  - Urine is neg for infection  - PT eval requested- SNF  B/l ankle pain - Radiographs negative for acute fracture or dislocation  - RICE - Pain-control with prn's  -not gout, ? Arthritis-- add burst of steroids with some improvement -outpatient neurology follow up for nerve conduction? Patient thinks it is her fibromyagia -duplex negative for DVT  Depression, bipolar affective disorder  - Depression has been exacerbated by her brother's recent brain tumor diagnosis  - She denies SI, HI, or hallucinations  - Continue Cymbalta and trazodone   Hypothyroidism  - TSH ok - Continue current-dose Synthroid for now   Leukocytosis  - no fever or apparent focus of infection -    Medical Consultants:    None.   Discharge Exam:   Vitals:   09/26/15 2140 09/27/15 0618  BP: 125/62 136/80  Pulse: 91 81  Resp: 18 18  Temp: 98.1 F (  36.7 C) 98 F (36.7 C)   Vitals:   09/26/15 0645 09/26/15 1400 09/26/15 2140 09/27/15 0618  BP: (!) 143/73 106/62 125/62 136/80  Pulse: 76 85 91 81  Resp: 18 19 18 18   Temp: 97.4 F (36.3 C) 97.9 F (36.6 C) 98.1 F (36.7 C) 98 F (36.7 C)  TempSrc: Oral Oral Oral Oral  SpO2: 97% 93% 97% 99%  Weight:      Height:        Gen:  NAD    The  results of significant diagnostics from this hospitalization (including imaging, microbiology, ancillary and laboratory) are listed below for reference.     Procedures and Diagnostic Studies:   Dg Chest 2 View  Result Date: 09/23/2015 CLINICAL DATA:  Acute onset of generalized weakness, status post fall. Initial encounter. EXAM: CHEST  2 VIEW COMPARISON:  Chest radiograph performed 12/28/2012 FINDINGS: There is elevation of the right hemidiaphragm, with mild bilateral atelectasis. No pleural effusion or pneumothorax is seen. The heart is normal in size. No acute osseous abnormalities are seen. Clips are noted about the gastroesophageal junction. IMPRESSION: Elevation of the right hemidiaphragm, with mild bilateral atelectasis. Electronically Signed   By: Roanna Raider M.D.   On: 09/23/2015 23:35   Dg Shoulder Right  Result Date: 09/23/2015 CLINICAL DATA:  Fall today.  Right shoulder pain. EXAM: RIGHT SHOULDER - 2+ VIEW COMPARISON:  None. FINDINGS: The right shoulder is located. No acute fracture is present. Moderate degenerative changes are present at the [facility] joint. The clavicle is intact. The scapula is intact. The visualized right hemi thorax is clear. IMPRESSION: 1. Moderate degenerative changes at the right [facility] joint. 2. No acute abnormality. Electronically Signed   By: Marin Roberts M.D.   On: 09/23/2015 19:53   Dg Ankle Complete Left  Result Date: 09/23/2015 CLINICAL DATA:  Fall today.  Left foot and ankle pain. EXAM: LEFT ANKLE COMPLETE - 3+ VIEW COMPARISON:  Left ankle radiograph 08/22/2012 FINDINGS: Mild soft tissue swelling is present over the medial malleolus. There is no underlying fracture. Chronic irregularity at the medial malleolus is stable. IMPRESSION: Soft tissue swelling over the medial malleolus without underlying fracture. Electronically Signed   By: Marin Roberts M.D.   On: 09/23/2015 19:58   Mr Thoracic Spine Wo Contrast  Result Date: 09/23/2015 CLINICAL DATA:   Patient with BILATERAL chronic leg pain. Patient fell and was on the ground for several hours. Weaker than usual and cannot get around. Previous lumbar surgery. EXAM: MRI THORACIC AND LUMBAR SPINE WITHOUT CONTRAST TECHNIQUE: Multiplanar and multiecho pulse sequences of the thoracic and lumbar spine were obtained without intravenous contrast. COMPARISON:  None. FINDINGS: MRI THORACIC SPINE FINDINGS Alignment:  Anatomic Segmentation: Previous lumbar spine MRIs are available and correlate with 5 lumbar-type vertebrae. Counting down the odontoid, there are only 11 thoracic vertebrae if numeric assignment of 5 lumbar vertebrae is established. The previous nomenclature will be continued. The last thoracolumbar disc space will be labeled T11-L1. Vertebrae: No worrisome osseous lesion.  No compression fracture. Cord: Multiple areas of stenosis without significant cord compression, most notable at T10-11. No abnormal cord signal. Paraspinal and other soft tissues: Unremarkable. Disc levels: He had The individual disc spaces are examined as follows: T1-2:  Unremarkable. T2-3:  Unremarkable. T3-4:  Unremarkable. T4-5:  Annular bulge. T5-6:  Unremarkable. T6-7:  Shallow central protrusion.  No impingement. T7-8:  Annular bulge. T8-9: Central protrusion. Posterior element hypertrophy. No impingement. T9-10: Disc space narrowing. Annular bulge. Posterior element hypertrophy. No  impingement. T10-11: Central protrusion. Posterior element hypertrophy. Moderate stenosis. No frank cord compression or abnormal cord signal. T11-L1: Central protrusion. Posterior element hypertrophy. No impingement. MRI LUMBAR SPINE FINDINGS Segmentation: Abnormal thoracic segmentation. LEFT Thoracolumbar interspace labeled T11-L1. Alignment:  Anatomic Vertebrae:  Degenerative change.  No acute osseous findings. Conus medullaris: Extends to the L1 level and appears normal. Paraspinal and other soft tissues: Unremarkable. Disc levels: L1-L2: Central and  leftward protrusion. Posterior element hypertrophy. LEFT subarticular zone narrowing could affect the L2 nerve root. L2-L3: Central protrusion. Posterior element hypertrophy. Mild stenosis. Either L3 nerve root could be affected. L3-L4: Central protrusion. Prior laminectomy with pseudomeningocele. No residual impingement. L4-L5: Advanced disc space narrowing. Central laminectomy. Dorsal pseudomeningocele. No stenosis. No residual impingement. L5-S1: Central and rightward protrusion. Posterior element hypertrophy. RIGHT greater than LEFT subarticular zone and foraminal zone narrowing could affect the L5 and S1 nerve roots. IMPRESSION: MR THORACIC SPINE IMPRESSION Segmentation anomaly.  Only 11 thoracic vertebrae are reported. No thoracic compression fracture. No intraspinal mass lesion, significant cord compression, or abnormal cord signal. Multilevel spondylosis, worst at T10-11. MR LUMBAR SPINE IMPRESSION No lumbar compression fracture or significant stenosis. Previous lumbar decompression at L3-4 and L4-5 without residual impingement. Central and rightward disc protrusion at L5-S1 in association with posterior element hypertrophy could affect the RIGHT greater than LEFT L5 and S1 nerve roots. Electronically Signed   By: Elsie StainJohn T Curnes M.D.   On: 09/23/2015 21:24   Mr Lumbar Spine Wo Contrast  Result Date: 09/23/2015 CLINICAL DATA:  Patient with BILATERAL chronic leg pain. Patient fell and was on the ground for several hours. Weaker than usual and cannot get around. Previous lumbar surgery. EXAM: MRI THORACIC AND LUMBAR SPINE WITHOUT CONTRAST TECHNIQUE: Multiplanar and multiecho pulse sequences of the thoracic and lumbar spine were obtained without intravenous contrast. COMPARISON:  None. FINDINGS: MRI THORACIC SPINE FINDINGS Alignment:  Anatomic Segmentation: Previous lumbar spine MRIs are available and correlate with 5 lumbar-type vertebrae. Counting down the odontoid, there are only 11 thoracic vertebrae if  numeric assignment of 5 lumbar vertebrae is established. The previous nomenclature will be continued. The last thoracolumbar disc space will be labeled T11-L1. Vertebrae: No worrisome osseous lesion.  No compression fracture. Cord: Multiple areas of stenosis without significant cord compression, most notable at T10-11. No abnormal cord signal. Paraspinal and other soft tissues: Unremarkable. Disc levels: He had The individual disc spaces are examined as follows: T1-2:  Unremarkable. T2-3:  Unremarkable. T3-4:  Unremarkable. T4-5:  Annular bulge. T5-6:  Unremarkable. T6-7:  Shallow central protrusion.  No impingement. T7-8:  Annular bulge. T8-9: Central protrusion. Posterior element hypertrophy. No impingement. T9-10: Disc space narrowing. Annular bulge. Posterior element hypertrophy. No impingement. T10-11: Central protrusion. Posterior element hypertrophy. Moderate stenosis. No frank cord compression or abnormal cord signal. T11-L1: Central protrusion. Posterior element hypertrophy. No impingement. MRI LUMBAR SPINE FINDINGS Segmentation: Abnormal thoracic segmentation. LEFT Thoracolumbar interspace labeled T11-L1. Alignment:  Anatomic Vertebrae:  Degenerative change.  No acute osseous findings. Conus medullaris: Extends to the L1 level and appears normal. Paraspinal and other soft tissues: Unremarkable. Disc levels: L1-L2: Central and leftward protrusion. Posterior element hypertrophy. LEFT subarticular zone narrowing could affect the L2 nerve root. L2-L3: Central protrusion. Posterior element hypertrophy. Mild stenosis. Either L3 nerve root could be affected. L3-L4: Central protrusion. Prior laminectomy with pseudomeningocele. No residual impingement. L4-L5: Advanced disc space narrowing. Central laminectomy. Dorsal pseudomeningocele. No stenosis. No residual impingement. L5-S1: Central and rightward protrusion. Posterior element hypertrophy. RIGHT greater than LEFT subarticular zone and  foraminal zone narrowing  could affect the L5 and S1 nerve roots. IMPRESSION: MR THORACIC SPINE IMPRESSION Segmentation anomaly.  Only 11 thoracic vertebrae are reported. No thoracic compression fracture. No intraspinal mass lesion, significant cord compression, or abnormal cord signal. Multilevel spondylosis, worst at T10-11. MR LUMBAR SPINE IMPRESSION No lumbar compression fracture or significant stenosis. Previous lumbar decompression at L3-4 and L4-5 without residual impingement. Central and rightward disc protrusion at L5-S1 in association with posterior element hypertrophy could affect the RIGHT greater than LEFT L5 and S1 nerve roots. Electronically Signed   By: Elsie StainJohn T Curnes M.D.   On: 09/23/2015 21:24   Dg Foot Complete Left  Result Date: 09/23/2015 CLINICAL DATA:  Fall today.  Left ankle and foot pain. EXAM: LEFT FOOT - COMPLETE 3+ VIEW COMPARISON:  Left ankle and foot radiographs 08/22/2012 FINDINGS: Progressive osteopenia is present. No acute bone or soft tissue abnormalities are present. Calcaneal spurs are stable. IMPRESSION: 1. No acute osseous abnormality. 2. Progressive osteopenia. Electronically Signed   By: Marin Robertshristopher  Mattern M.D.   On: 09/23/2015 19:54     Labs:   Basic Metabolic Panel:  Recent Labs Lab 09/23/15 1908 09/25/15 0700  NA 137 140  K 4.3 3.7  CL 99* 102  CO2 31 33*  GLUCOSE 112* 116*  BUN 13 13  CREATININE 0.81 0.77  CALCIUM 9.1 8.8*   GFR Estimated Creatinine Clearance: 85.2 mL/min (by C-G formula based on SCr of 0.8 mg/dL). Liver Function Tests:  Recent Labs Lab 09/23/15 1908  AST 21  ALT 10*  ALKPHOS 78  BILITOT 1.2  PROT 7.4  ALBUMIN 3.9   No results for input(s): LIPASE, AMYLASE in the last 168 hours. No results for input(s): AMMONIA in the last 168 hours. Coagulation profile No results for input(s): INR, PROTIME in the last 168 hours.  CBC:  Recent Labs Lab 09/23/15 1908 09/25/15 0700  WBC 13.4* 10.0  NEUTROABS 10.1*  --   HGB 13.3 11.4*  HCT 41.7  37.1  MCV 95.6 99.2  PLT 282 260   Cardiac Enzymes:  Recent Labs Lab 09/23/15 1908  CKTOTAL 94   BNP: Invalid input(s): POCBNP CBG:  Recent Labs Lab 09/26/15 0758  GLUCAP 137*   D-Dimer No results for input(s): DDIMER in the last 72 hours. Hgb A1c No results for input(s): HGBA1C in the last 72 hours. Lipid Profile No results for input(s): CHOL, HDL, LDLCALC, TRIG, CHOLHDL, LDLDIRECT in the last 72 hours. Thyroid function studies No results for input(s): TSH, T4TOTAL, T3FREE, THYROIDAB in the last 72 hours.  Invalid input(s): FREET3 Anemia work up No results for input(s): VITAMINB12, FOLATE, FERRITIN, TIBC, IRON, RETICCTPCT in the last 72 hours. Microbiology No results found for this or any previous visit (from the past 240 hour(s)).   Discharge Instructions:   Discharge Instructions    Diet - low sodium heart healthy    Complete by:  As directed   Diet Carb Modified    Complete by:  As directed   Discharge instructions    Complete by:  As directed   Carb mod diet while on steroids Outpatient OSA study-- needs to wear O2 at night until sleep study If not improved with LE pain, will need Nerve conduction study with neurology   Increase activity slowly    Complete by:  As directed       Medication List    STOP taking these medications   lisinopril 10 MG tablet Commonly known as:  PRINIVIL,ZESTRIL  TAKE these medications   bisacodyl 5 MG EC tablet Commonly known as:  DULCOLAX Take 1 tablet (5 mg total) by mouth daily as needed for moderate constipation.   cholecalciferol 1000 units tablet Commonly known as:  VITAMIN D Take 1,000 Units by mouth daily.   DULoxetine 60 MG capsule Commonly known as:  CYMBALTA Take 60 mg by mouth every evening.   DULoxetine 30 MG capsule Commonly known as:  CYMBALTA Take 30 mg by mouth every morning.   HYDROcodone-acetaminophen 5-325 MG tablet Commonly known as:  NORCO/VICODIN Take 1-2 tablets by mouth every 6  (six) hours as needed for moderate pain or severe pain. What changed:  how much to take  reasons to take this   levothyroxine 75 MCG tablet Commonly known as:  SYNTHROID, LEVOTHROID Take 75 mcg by mouth daily before breakfast.   meloxicam 15 MG tablet Commonly known as:  MOBIC Take 15 mg by mouth daily.   omeprazole 10 MG capsule Commonly known as:  PRILOSEC Take 10 mg by mouth 2 (two) times daily.   polyethylene glycol packet Commonly known as:  MIRALAX / GLYCOLAX Take 17 g by mouth daily as needed for mild constipation.   pravastatin 20 MG tablet Commonly known as:  PRAVACHOL Take 20 mg by mouth at bedtime.   predniSONE 50 MG tablet Commonly known as:  DELTASONE 40 mg x 2 days, 30 mg x2 days, 20 mg x2 days, 10 mgx 2 days then stop   pregabalin 100 MG capsule Commonly known as:  LYRICA Take 1 capsule (100 mg total) by mouth 2 (two) times daily. What changed:  when to take this  reasons to take this   SUMAtriptan 25 MG tablet Commonly known as:  IMITREX Take 25 mg by mouth every 2 (two) hours as needed for migraine.   traZODone 100 MG tablet Commonly known as:  DESYREL Take 300 mg by mouth at bedtime.      Follow-up Information    Astrid Divine, MD Follow up in 1 week(s).   Specialty:  Family Medicine Why:  upon d/c from SNF Contact information: [address] [place] [address] [phone]            Time coordinating discharge: 35 min  Signed:  Jenasis Straley Juanetta Gosling   [facility] 09/27/2015, 7:58 AM

## 2015-09-27 NOTE — Progress Notes (Signed)
Patient for transport [facility]. Report called to Arvilla Marketon Okafol. Patient aware of transfer.

## 2015-09-27 NOTE — Clinical Social Work Placement (Signed)
   CLINICAL SOCIAL WORK PLACEMENT  NOTE  Date:  09/27/2015  Patient Details  Name: Lindsey Rowe MRN: [id number] Date of Birth: 10/15/1945  Clinical Social Work is seeking post-discharge placement for this patient at the Skilled  Nursing Facility level of care (*CSW will initial, date and re-position this form in  chart as items are completed):  Yes   Patient/family provided with [facility] Clinical Social Work Department's list of facilities offering this level of care within the geographic area requested by the patient (or if unable, by the patient's family).  Yes   Patient/family informed of their freedom to choose among providers that offer the needed level of care, that participate in Medicare, Medicaid or managed care program needed by the patient, have an available bed and are willing to accept the patient.  Yes   Patient/family informed of [facility]'s ownership interest in [facility] and [facility], as well as of the fact that they are under no obligation to receive care at these facilities.  PASRR submitted to EDS on 09/25/15     PASRR number received on       Existing PASRR number confirmed on       FL2 transmitted to all facilities in geographic area requested by pt/family on 09/25/15     FL2 transmitted to all facilities within larger geographic area on       Patient informed that his/her managed care company has contracts with or will negotiate with certain facilities, including the following:        Yes   Patient/family informed of bed offers received.  Patient chooses bed at  [facility] )     Physician recommends and patient chooses bed at      Patient to be transferred to  [facility] ) on 09/27/15.  Patient to be transferred to facility by  [facility] )     Patient family notified on 09/27/15 of transfer.  Name of family member notified:   (Pt's friend, [name] )     PHYSICIAN Please sign FL2      Additional Comment:    _______________________________________________ Derenda FennelNixon, Jakeira Seeman A 09/27/2015, 2:09 PM

## 2015-09-27 NOTE — Clinical Social Work Note (Signed)
HTA authorization obtained [id number] for [facility].   Derenda FennelBashira Marlisa Caridi, MSW [phone] 09/27/2015 10:41 AM

## 2015-09-28 ENCOUNTER — Encounter: Payer: Self-pay | Admitting: Internal Medicine

## 2015-09-28 ENCOUNTER — Non-Acute Institutional Stay (SKILLED_NURSING_FACILITY): Payer: PPO | Admitting: Internal Medicine

## 2015-09-28 DIAGNOSIS — G4700 Insomnia, unspecified: Secondary | ICD-10-CM

## 2015-09-28 DIAGNOSIS — M79604 Pain in right leg: Secondary | ICD-10-CM | POA: Diagnosis not present

## 2015-09-28 DIAGNOSIS — K219 Gastro-esophageal reflux disease without esophagitis: Secondary | ICD-10-CM | POA: Diagnosis not present

## 2015-09-28 DIAGNOSIS — M79605 Pain in left leg: Secondary | ICD-10-CM | POA: Diagnosis not present

## 2015-09-28 DIAGNOSIS — E7800 Pure hypercholesterolemia, unspecified: Secondary | ICD-10-CM | POA: Diagnosis not present

## 2015-09-28 DIAGNOSIS — K5900 Constipation, unspecified: Secondary | ICD-10-CM

## 2015-09-28 DIAGNOSIS — R531 Weakness: Secondary | ICD-10-CM

## 2015-09-28 DIAGNOSIS — F329 Major depressive disorder, single episode, unspecified: Secondary | ICD-10-CM

## 2015-09-28 DIAGNOSIS — M797 Fibromyalgia: Secondary | ICD-10-CM

## 2015-09-28 DIAGNOSIS — E038 Other specified hypothyroidism: Secondary | ICD-10-CM

## 2015-09-28 NOTE — Progress Notes (Signed)
[facility]  PCP: Astrid DivineGRIFFIN,ELAINE COLLINS, MD   Code Status: DNR  Goals of care: Advanced Directive information Advanced Directives 09/28/2015  Does patient have an advance directive? Yes  Type of Advance Directive Out of facility DNR (pink MOST or yellow form)  Does patient want to make changes to advanced directive? No - Patient declined  Copy of advanced directive(s) in chart? Yes  Would patient like information on creating an advanced directive? -  Pre-existing out of facility DNR order (yellow form or pink MOST form) -       Extended Emergency Contact Information Primary Emergency Contact: Stultz,[name] Address: [address]          [place] Home Phone: [phone] Work Phone: [phone] Relation: Friend Secondary Emergency Contact: Butler,Connie Address: [address]          [place], [place] [id number] [place] Home Phone: [phone] Relation: None   Allergies  Allergen Reactions  . Gabapentin Other (See Comments)    Right foot and leg swelled   . Cefadroxil Itching    Ends of hair itched     Chief Complaint  Patient presents with  . New Admit To SNF    New Admission     HPI:  Patient is a 70 y.o. female seen today for short term rehabilitation post hospital admission from 09/23/15-09/27/15 post fall with generalized weakness. She complained of bilateral leg pain. CT head was negative for acute intracranial abnormalities and infectious etiology was ruled out. xrays ruled out acute fracture and dislocation. DVT was ruled out. She was started on steroid taper to help with her pain with her history of fibromyalgia. She has medical history of depression, HTN, GERD, hypothyroidism among others. She is seen in her room today.   Review of Systems:  Constitutional: Negative for fever, chills, diaphoresis.  HENT: Negative for congestion, nasal discharge, difficulty swallowing. Positive for headaches with  history of migraine.   Eyes: Negative for blurred vision, double vision and discharge.  Respiratory: Negative for cough, shortness of breath and wheezing. Positive for post nasal drip.   Cardiovascular: Negative for chest pain, palpitations, leg swelling.  Gastrointestinal: Negative for heartburn, nausea, vomiting, abdominal pain. Last bowel movement was today. Genitourinary: Negative for dysuria and flank pain.  Musculoskeletal: Negative for fall in the facility.  Skin: Negative for itching, rash.  Neurological: Negative for dizziness. Psychiatric/Behavioral: positive for depression   Past Medical History:  Diagnosis Date  . Arthritis   . Back pain   . Bipolar 1 disorder (HCC)   . Complication of anesthesia    hard to wake up anesthesia  . Depression   . GERD (gastroesophageal reflux disease)   . Headache(784.0)    migraines and tension headaches  . Hyperlipidemia   . Hypertension   . Hypothyroidism   . Thyroid disease    Past Surgical History:  Procedure Laterality Date  . APPENDECTOMY    . BACK SURGERY    . EYE SURGERY Bilateral    lens implant  . GASTRIC BYPASS    . LUMBAR LAMINECTOMY/DECOMPRESSION MICRODISCECTOMY Right 07/23/2012   Procedure: LUMBAR LAMINECTOMY/DECOMPRESSION MICRODISCECTOMY 2 LEVELS;  Surgeon: Karn CassisErnesto M Botero, MD;  Location: [facility];  Service: Neurosurgery;  Laterality: Right;  Right Lumbar three-four  lumbar four-five Laminectomy/Foraminotomy  . NASAL SINUS SURGERY Bilateral   . TONSILLECTOMY    . WRIST SURGERY     Social History:   reports that she has never smoked.  She has never used smokeless tobacco. She reports that she drinks alcohol. She reports that she does not use drugs.  No family history on file.  Medications:   Medication List       Accurate as of 09/28/15 12:02 PM. Always use your most recent med list.          bisacodyl 5 MG EC tablet Commonly known as:  DULCOLAX Take 1 tablet (5 mg total) by mouth daily as needed for  moderate constipation.   cholecalciferol 1000 units tablet Commonly known as:  VITAMIN D Take 1,000 Units by mouth daily.   DULoxetine 60 MG capsule Commonly known as:  CYMBALTA Take 60 mg by mouth every evening.   DULoxetine 30 MG capsule Commonly known as:  CYMBALTA Take 30 mg by mouth every morning.   HYDROcodone-acetaminophen 5-325 MG tablet Commonly known as:  NORCO/VICODIN Take 1-2 tablets by mouth every 6 (six) hours as needed for moderate pain or severe pain.   levothyroxine 75 MCG tablet Commonly known as:  SYNTHROID, LEVOTHROID Take 75 mcg by mouth daily before breakfast.   meloxicam 15 MG tablet Commonly known as:  MOBIC Take 15 mg by mouth daily.   omeprazole 10 MG capsule Commonly known as:  PRILOSEC Take 10 mg by mouth 2 (two) times daily.   polyethylene glycol packet Commonly known as:  MIRALAX / GLYCOLAX Take 17 g by mouth daily as needed for mild constipation.   pravastatin 20 MG tablet Commonly known as:  PRAVACHOL Take 20 mg by mouth at bedtime.   predniSONE 50 MG tablet Commonly known as:  DELTASONE 40 mg x 2 days, 30 mg x2 days, 20 mg x2 days, 10 mgx 2 days then stop   pregabalin 100 MG capsule Commonly known as:  LYRICA Take 1 capsule (100 mg total) by mouth 2 (two) times daily.   SUMAtriptan 25 MG tablet Commonly known as:  IMITREX Take 25 mg by mouth every 2 (two) hours as needed for migraine.   traZODone 100 MG tablet Commonly known as:  DESYREL Take 300 mg by mouth at bedtime.       Immunizations: Immunization History  Administered Date(s) Administered  . Influenza Whole 11/17/2008, 12/26/2009  . Td 03/11/2009     Physical Exam:  Vitals:   09/28/15 1156  BP: 140/72  Pulse: 72  Resp: 20  Temp: 98 F (36.7 C)  TempSrc: Oral  SpO2: 97%  Weight: 265 lb (120.2 kg)  Height: 5\' 3"  (1.6 m)   Body mass index is 46.94 kg/m.  General- elderly female, morbidly obese, in no acute distress Head- normocephalic,  atraumatic Nose- no nasal discharge Throat- moist mucus membrane Eyes- PERRLA, EOMI, no pallor, no icterus, no discharge, normal conjunctiva, normal sclera Neck- no cervical lymphadenopathy Cardiovascular- normal s1,s2, no murmur Respiratory- bilateral clear to auscultation, no wheeze, no rhonchi, no crackles, no use of accessory muscles Abdomen- bowel sounds present, soft, non tender Musculoskeletal- able to move all 4 extremities, generalized weakness more to her lower extremities Neurological- alert and oriented to person, place and time Skin- warm and dry, ace wrap to her legs Psychiatry- normal mood and affect    Labs reviewed: Basic Metabolic Panel:  Recent Labs  16/10/96 1908 09/25/15 0700  NA 137 140  K 4.3 3.7  CL 99* 102  CO2 31 33*  GLUCOSE 112* 116*  BUN 13 13  CREATININE 0.81 0.77  CALCIUM 9.1 8.8*   Liver Function Tests:  Recent Labs  09/23/15 1908  AST 21  ALT 10*  ALKPHOS 78  BILITOT 1.2  PROT 7.4  ALBUMIN 3.9   No results for input(s): LIPASE, AMYLASE in the last 8760 hours. No results for input(s): AMMONIA in the last 8760 hours. CBC:  Recent Labs  09/23/15 1908 09/25/15 0700  WBC 13.4* 10.0  NEUTROABS 10.1*  --   HGB 13.3 11.4*  HCT 41.7 37.1  MCV 95.6 99.2  PLT 282 260   Cardiac Enzymes:  Recent Labs  09/23/15 1908  CKTOTAL 94   BNP: Invalid input(s): POCBNP CBG:  Recent Labs  09/26/15 0758 09/27/15 0803  GLUCAP 137* 87    Radiological Exams: Dg Chest 2 View  Result Date: 09/23/2015 CLINICAL DATA:  Acute onset of generalized weakness, status post fall. Initial encounter. EXAM: CHEST  2 VIEW COMPARISON:  Chest radiograph performed 12/28/2012 FINDINGS: There is elevation of the right hemidiaphragm, with mild bilateral atelectasis. No pleural effusion or pneumothorax is seen. The heart is normal in size. No acute osseous abnormalities are seen. Clips are noted about the gastroesophageal junction. IMPRESSION: Elevation of  the right hemidiaphragm, with mild bilateral atelectasis. Electronically Signed   By: Roanna RaiderJeffery  Chang M.D.   On: 09/23/2015 23:35   Dg Shoulder Right  Result Date: 09/23/2015 CLINICAL DATA:  Fall today.  Right shoulder pain. EXAM: RIGHT SHOULDER - 2+ VIEW COMPARISON:  None. FINDINGS: The right shoulder is located. No acute fracture is present. Moderate degenerative changes are present at the [facility] joint. The clavicle is intact. The scapula is intact. The visualized right hemi thorax is clear. IMPRESSION: 1. Moderate degenerative changes at the right [facility] joint. 2. No acute abnormality. Electronically Signed   By: Marin Robertshristopher  Mattern M.D.   On: 09/23/2015 19:53   Dg Ankle Complete Left  Result Date: 09/23/2015 CLINICAL DATA:  Fall today.  Left foot and ankle pain. EXAM: LEFT ANKLE COMPLETE - 3+ VIEW COMPARISON:  Left ankle radiograph 08/22/2012 FINDINGS: Mild soft tissue swelling is present over the medial malleolus. There is no underlying fracture. Chronic irregularity at the medial malleolus is stable. IMPRESSION: Soft tissue swelling over the medial malleolus without underlying fracture. Electronically Signed   By: Marin Robertshristopher  Mattern M.D.   On: 09/23/2015 19:58   Mr Thoracic Spine Wo Contrast  Result Date: 09/23/2015 CLINICAL DATA:  Patient with BILATERAL chronic leg pain. Patient fell and was on the ground for several hours. Weaker than usual and cannot get around. Previous lumbar surgery. EXAM: MRI THORACIC AND LUMBAR SPINE WITHOUT CONTRAST TECHNIQUE: Multiplanar and multiecho pulse sequences of the thoracic and lumbar spine were obtained without intravenous contrast. COMPARISON:  None. FINDINGS: MRI THORACIC SPINE FINDINGS Alignment:  Anatomic Segmentation: Previous lumbar spine MRIs are available and correlate with 5 lumbar-type vertebrae. Counting down the odontoid, there are only 11 thoracic vertebrae if numeric assignment of 5 lumbar vertebrae is established. The previous nomenclature will be  continued. The last thoracolumbar disc space will be labeled T11-L1. Vertebrae: No worrisome osseous lesion.  No compression fracture. Cord: Multiple areas of stenosis without significant cord compression, most notable at T10-11. No abnormal cord signal. Paraspinal and other soft tissues: Unremarkable. Disc levels: He had The individual disc spaces are examined as follows: T1-2:  Unremarkable. T2-3:  Unremarkable. T3-4:  Unremarkable. T4-5:  Annular bulge. T5-6:  Unremarkable. T6-7:  Shallow central protrusion.  No impingement. T7-8:  Annular bulge. T8-9: Central protrusion. Posterior element hypertrophy. No impingement. T9-10: Disc space narrowing. Annular bulge. Posterior element hypertrophy. No impingement. T10-11: Central protrusion. Posterior element  hypertrophy. Moderate stenosis. No frank cord compression or abnormal cord signal. T11-L1: Central protrusion. Posterior element hypertrophy. No impingement. MRI LUMBAR SPINE FINDINGS Segmentation: Abnormal thoracic segmentation. LEFT Thoracolumbar interspace labeled T11-L1. Alignment:  Anatomic Vertebrae:  Degenerative change.  No acute osseous findings. Conus medullaris: Extends to the L1 level and appears normal. Paraspinal and other soft tissues: Unremarkable. Disc levels: L1-L2: Central and leftward protrusion. Posterior element hypertrophy. LEFT subarticular zone narrowing could affect the L2 nerve root. L2-L3: Central protrusion. Posterior element hypertrophy. Mild stenosis. Either L3 nerve root could be affected. L3-L4: Central protrusion. Prior laminectomy with pseudomeningocele. No residual impingement. L4-L5: Advanced disc space narrowing. Central laminectomy. Dorsal pseudomeningocele. No stenosis. No residual impingement. L5-S1: Central and rightward protrusion. Posterior element hypertrophy. RIGHT greater than LEFT subarticular zone and foraminal zone narrowing could affect the L5 and S1 nerve roots. IMPRESSION: MR THORACIC SPINE IMPRESSION  Segmentation anomaly.  Only 11 thoracic vertebrae are reported. No thoracic compression fracture. No intraspinal mass lesion, significant cord compression, or abnormal cord signal. Multilevel spondylosis, worst at T10-11. MR LUMBAR SPINE IMPRESSION No lumbar compression fracture or significant stenosis. Previous lumbar decompression at L3-4 and L4-5 without residual impingement. Central and rightward disc protrusion at L5-S1 in association with posterior element hypertrophy could affect the RIGHT greater than LEFT L5 and S1 nerve roots. Electronically Signed   By: Elsie Stain M.D.   On: 09/23/2015 21:24   Mr Lumbar Spine Wo Contrast  Result Date: 09/23/2015 CLINICAL DATA:  Patient with BILATERAL chronic leg pain. Patient fell and was on the ground for several hours. Weaker than usual and cannot get around. Previous lumbar surgery. EXAM: MRI THORACIC AND LUMBAR SPINE WITHOUT CONTRAST TECHNIQUE: Multiplanar and multiecho pulse sequences of the thoracic and lumbar spine were obtained without intravenous contrast. COMPARISON:  None. FINDINGS: MRI THORACIC SPINE FINDINGS Alignment:  Anatomic Segmentation: Previous lumbar spine MRIs are available and correlate with 5 lumbar-type vertebrae. Counting down the odontoid, there are only 11 thoracic vertebrae if numeric assignment of 5 lumbar vertebrae is established. The previous nomenclature will be continued. The last thoracolumbar disc space will be labeled T11-L1. Vertebrae: No worrisome osseous lesion.  No compression fracture. Cord: Multiple areas of stenosis without significant cord compression, most notable at T10-11. No abnormal cord signal. Paraspinal and other soft tissues: Unremarkable. Disc levels: He had The individual disc spaces are examined as follows: T1-2:  Unremarkable. T2-3:  Unremarkable. T3-4:  Unremarkable. T4-5:  Annular bulge. T5-6:  Unremarkable. T6-7:  Shallow central protrusion.  No impingement. T7-8:  Annular bulge. T8-9: Central protrusion.  Posterior element hypertrophy. No impingement. T9-10: Disc space narrowing. Annular bulge. Posterior element hypertrophy. No impingement. T10-11: Central protrusion. Posterior element hypertrophy. Moderate stenosis. No frank cord compression or abnormal cord signal. T11-L1: Central protrusion. Posterior element hypertrophy. No impingement. MRI LUMBAR SPINE FINDINGS Segmentation: Abnormal thoracic segmentation. LEFT Thoracolumbar interspace labeled T11-L1. Alignment:  Anatomic Vertebrae:  Degenerative change.  No acute osseous findings. Conus medullaris: Extends to the L1 level and appears normal. Paraspinal and other soft tissues: Unremarkable. Disc levels: L1-L2: Central and leftward protrusion. Posterior element hypertrophy. LEFT subarticular zone narrowing could affect the L2 nerve root. L2-L3: Central protrusion. Posterior element hypertrophy. Mild stenosis. Either L3 nerve root could be affected. L3-L4: Central protrusion. Prior laminectomy with pseudomeningocele. No residual impingement. L4-L5: Advanced disc space narrowing. Central laminectomy. Dorsal pseudomeningocele. No stenosis. No residual impingement. L5-S1: Central and rightward protrusion. Posterior element hypertrophy. RIGHT greater than LEFT subarticular zone and foraminal zone narrowing could affect the  L5 and S1 nerve roots. IMPRESSION: MR THORACIC SPINE IMPRESSION Segmentation anomaly.  Only 11 thoracic vertebrae are reported. No thoracic compression fracture. No intraspinal mass lesion, significant cord compression, or abnormal cord signal. Multilevel spondylosis, worst at T10-11. MR LUMBAR SPINE IMPRESSION No lumbar compression fracture or significant stenosis. Previous lumbar decompression at L3-4 and L4-5 without residual impingement. Central and rightward disc protrusion at L5-S1 in association with posterior element hypertrophy could affect the RIGHT greater than LEFT L5 and S1 nerve roots. Electronically Signed   By: Elsie Stain M.D.    On: 09/23/2015 21:24   Dg Foot Complete Left  Result Date: 09/23/2015 CLINICAL DATA:  Fall today.  Left ankle and foot pain. EXAM: LEFT FOOT - COMPLETE 3+ VIEW COMPARISON:  Left ankle and foot radiographs 08/22/2012 FINDINGS: Progressive osteopenia is present. No acute bone or soft tissue abnormalities are present. Calcaneal spurs are stable. IMPRESSION: 1. No acute osseous abnormality. 2. Progressive osteopenia. Electronically Signed   By: Marin Roberts M.D.   On: 09/23/2015 19:54    Assessment/Plan  Generalized weakness Will have her work with physical therapy and occupational therapy team to help with gait training and muscle strengthening exercises.fall precautions. Skin care. Encourage to be out of bed.   Bilateral leg pain Unidentified etiology at present. Concern for possible fibromyalgia vs inflammatory arthritis. Currently on tapering course of prednisone and pain medication with some relief. Will have patient work with PT/OT as tolerated to regain strength and restore function.  Fall precautions are in place. If no improvement will get neurology referral for nerve conduction study  Fibromyalgia Continue cymbalta 60 mg daily pm and 30 mg in am. Will have patient work with PT/OT as tolerated to regain strength and restore function.  Fall precautions are in place. Continue norco 5-325 mg 1-2 tab q6h prn pain. Continue lyrica 100 mg bid  Chronic depression Continue cymbalta and trazodone, get psychiatry referral  Hypothyroidism Lab Results  Component Value Date   TSH 3.287 09/24/2015   Continue levothyroxine 75 mcg daily and monitor  Insomnia Continue trazodone for now  gerd Continue omeprazole 10 mg bid and monitor  HLD Continue her statin  Constipation Continue miralax daily prn with prn dulcolax   Goals of care: short term rehabilitation   Labs/tests ordered: cbc, cmp 10/03/15  Family/ staff Communication: reviewed care plan with patient and nursing  supervisor    Oneal Grout, MD Internal Medicine [facility] [address] [place], [place] [address] Cell Phone (Monday-Friday 8 am - 5 pm): [phone] On Call: [phone] and follow prompts after 5 pm and on weekends Office Phone: [phone] Office Fax: [phone]

## 2015-09-30 ENCOUNTER — Non-Acute Institutional Stay (SKILLED_NURSING_FACILITY): Payer: PPO | Admitting: Family

## 2015-09-30 ENCOUNTER — Encounter: Payer: Self-pay | Admitting: Family

## 2015-09-30 DIAGNOSIS — G43001 Migraine without aura, not intractable, with status migrainosus: Secondary | ICD-10-CM

## 2015-09-30 DIAGNOSIS — I10 Essential (primary) hypertension: Secondary | ICD-10-CM | POA: Diagnosis not present

## 2015-09-30 MED ORDER — CLONIDINE HCL 0.1 MG PO TABS: 0.1000 mg | ORAL_TABLET | Freq: Three times a day (TID) | ORAL | 3 refills | Status: DC | PRN

## 2015-09-30 MED ORDER — HYDROCHLOROTHIAZIDE 12.5 MG PO TABS: 12.5000 mg | ORAL_TABLET | Freq: Every day | ORAL | 3 refills | Status: DC

## 2015-09-30 NOTE — Progress Notes (Signed)
Location:   [facility]  Nursing Home Room Number: 601 Place of Service:  SNF (31) Provider:  Richarda Blade, FNP-C   Astrid Divine, MD  Patient Care Team: Maurice Small, MD as PCP - General (Family Medicine)  Extended Emergency Contact Information Primary Emergency Contact: Stultz,[name] Address: [address]          [place], [place] Home Phone: [phone] Work Phone: [phone] Relation: Friend Secondary Emergency Contact: Butler,Connie Address: [address]          [place], [place] [address] [place] Home Phone: [phone] Relation: None  Code Status:  DNR Goals of care: Advanced Directive information Advanced Directives 09/30/2015  Does patient have an advance directive? Yes  Type of Advance Directive Out of facility DNR (pink MOST or yellow form)  Does patient want to make changes to advanced directive? -  Copy of advanced directive(s) in chart? Yes  Would patient like information on creating an advanced directive? -  Pre-existing out of facility DNR order (yellow form or pink MOST form) -     Chief Complaint  Patient presents with  . Acute Visit    elevated blood pressure    HPI:  Pt is a 70 y.o. female seen today at [facility] for an acute visit for high blood pressure. She has a significant medical history of HTN, Depression, Arthritis, Hypothyroidism among others. She is seen in her room today. Facility Nurse reports patient's B/P ranging in the 150'/90's-180's/90's  HR Wnl. She denies any fever, chills, cough, chest pains, dizziness or shortness of breath. She complains of Migraine headache this visit rating 7/10 on scale.she was not able to participate in therapy today but states will try in the afternoon when the headache subsides. She states usually takes Imtrex at home for the migraine.Patient has imtrex on medication orders. Encourage to notify facility nurse whenever she  has a migraine.    Past Medical History:  Diagnosis Date  . Arthritis   . Back pain   . Bipolar 1 disorder (HCC)   . Complication of anesthesia    hard to wake up anesthesia  . Depression   . GERD (gastroesophageal reflux disease)   . Headache(784.0)    migraines and tension headaches  . Hyperlipidemia   . Hypertension   . Hypothyroidism   . Thyroid disease    Past Surgical History:  Procedure Laterality Date  . APPENDECTOMY    . BACK SURGERY    . EYE SURGERY Bilateral    lens implant  . GASTRIC BYPASS    . LUMBAR LAMINECTOMY/DECOMPRESSION MICRODISCECTOMY Right 07/23/2012   Procedure: LUMBAR LAMINECTOMY/DECOMPRESSION MICRODISCECTOMY 2 LEVELS;  Surgeon: Karn Cassis, MD;  Location: [facility];  Service: Neurosurgery;  Laterality: Right;  Right Lumbar three-four  lumbar four-five Laminectomy/Foraminotomy  . NASAL SINUS SURGERY Bilateral   . TONSILLECTOMY    . WRIST SURGERY      Allergies  Allergen Reactions  . Gabapentin Other (See Comments)    Right foot and leg swelled   . Cefadroxil Itching    Ends of hair itched       Medication List       Accurate as of 09/30/15 11:11 AM. Always use your most recent med list.          bisacodyl 5 MG EC tablet Commonly known as:  DULCOLAX Take 1 tablet (5 mg total) by mouth daily as needed for moderate constipation.  cholecalciferol 1000 units tablet Commonly known as:  VITAMIN D Take 1,000 Units by mouth daily.   DULoxetine 60 MG capsule Commonly known as:  CYMBALTA Take 60 mg by mouth every evening.   DULoxetine 30 MG capsule Commonly known as:  CYMBALTA Take 30 mg by mouth every morning.   HYDROcodone-acetaminophen 5-325 MG tablet Commonly known as:  NORCO/VICODIN Take 1-2 tablets by mouth every 6 (six) hours as needed for moderate pain or severe pain.   levothyroxine 75 MCG tablet Commonly known as:  SYNTHROID, LEVOTHROID Take 75 mcg by mouth daily before breakfast.   lisinopril 10 MG  tablet Commonly known as:  PRINIVIL,ZESTRIL Take 10 mg by mouth. Take one tablet daily for blood pressure -hold for SBP <110   meloxicam 15 MG tablet Commonly known as:  MOBIC Take 15 mg by mouth daily.   omeprazole 10 MG capsule Commonly known as:  PRILOSEC Take 10 mg by mouth 2 (two) times daily.   polyethylene glycol packet Commonly known as:  MIRALAX / GLYCOLAX Take 17 g by mouth daily as needed for mild constipation.   pravastatin 20 MG tablet Commonly known as:  PRAVACHOL Take 20 mg by mouth at bedtime.   predniSONE 50 MG tablet Commonly known as:  DELTASONE 40 mg x 2 days, 30 mg x2 days, 20 mg x2 days, 10 mgx 2 days then stop   pregabalin 100 MG capsule Commonly known as:  LYRICA Take 1 capsule (100 mg total) by mouth 2 (two) times daily.   SUMAtriptan 25 MG tablet Commonly known as:  IMITREX Take 25 mg by mouth every 2 (two) hours as needed for migraine.   traZODone 100 MG tablet Commonly known as:  DESYREL Take 300 mg by mouth at bedtime.       Review of Systems  Constitutional: Negative for activity change, appetite change, chills, fatigue and fever.  HENT: Negative for congestion, rhinorrhea, sinus pressure, sneezing and sore throat.   Eyes: Negative.   Respiratory: Negative for cough, chest tightness, shortness of breath and wheezing.   Cardiovascular: Negative for chest pain, palpitations and leg swelling.  Gastrointestinal: Negative for abdominal distention, abdominal pain, constipation, diarrhea, nausea and vomiting.  Genitourinary: Negative for dysuria, flank pain, frequency and urgency.  Musculoskeletal: Positive for gait problem.  Skin: Negative for color change, pallor and rash.  Neurological: Positive for headaches. Negative for dizziness, seizures, syncope and light-headedness.  Psychiatric/Behavioral: Negative for agitation, confusion, hallucinations and sleep disturbance. The patient is not nervous/anxious.     Immunization History   Administered Date(s) Administered  . Influenza Whole 11/17/2008, 12/26/2009  . Td 03/11/2009   Pertinent  Health Maintenance Due  Topic Date Due  . COLONOSCOPY  11/24/1996  . DEXA SCAN  11/25/2011  . PNA vac Low Risk Adult (1 of 2 - PCV13) 11/25/2011  . INFLUENZA VACCINE  09/06/2015  . MAMMOGRAM  11/21/2016      Vitals:   09/30/15 1049  BP: (!) 156/94  Pulse: 80  Resp: 18  Temp: 98 F (36.7 C)  SpO2: 92%  Weight: 266 lb (120.7 kg)  Height: 5\' 3"  (1.6 m)   Body mass index is 47.12 kg/m. Physical Exam  Constitutional: She is oriented to person, place, and time. She appears well-developed and well-nourished. No distress.  HENT:  Head: Normocephalic.  Mouth/Throat: Oropharynx is clear and moist. No oropharyngeal exudate.  Eyes: Conjunctivae and EOM are normal. Pupils are equal, round, and reactive to light. Right eye exhibits no discharge. Left eye exhibits  no discharge.  Neck: Normal range of motion. No JVD present. No thyromegaly present.  Cardiovascular: Normal rate and regular rhythm.   Pulmonary/Chest: Effort normal and breath sounds normal. No respiratory distress. She has no wheezes. She has no rales.  Abdominal: Soft. Bowel sounds are normal. She exhibits no distension. There is no tenderness. There is no rebound and no guarding.  Musculoskeletal: She exhibits no edema, tenderness or deformity.  Unsteady gait. Right Ankle limited ROM due to pain.   Lymphadenopathy:    She has no cervical adenopathy.  Neurological: She is oriented to person, place, and time.  Skin: Skin is warm and dry. No rash noted. She is not diaphoretic. No erythema. No pallor.  Psychiatric: She has a normal mood and affect.    Labs reviewed:  Recent Labs  09/23/15 1908 09/25/15 0700  NA 137 140  K 4.3 3.7  CL 99* 102  CO2 31 33*  GLUCOSE 112* 116*  BUN 13 13  CREATININE 0.81 0.77  CALCIUM 9.1 8.8*    Recent Labs  09/23/15 1908  AST 21  ALT 10*  ALKPHOS 78  BILITOT 1.2   PROT 7.4  ALBUMIN 3.9    Recent Labs  09/23/15 1908 09/25/15 0700  WBC 13.4* 10.0  NEUTROABS 10.1*  --   HGB 13.3 11.4*  HCT 41.7 37.1  MCV 95.6 99.2  PLT 282 260   Lab Results  Component Value Date   TSH 3.287 09/24/2015   No results found for: HGBA1C Lab Results  Component Value Date   CHOL 196 11/22/2009   HDL 45 11/22/2009   LDLCALC 120 (H) 11/22/2009   TRIG 156 (H) 11/22/2009   CHOLHDL 4.4 Ratio 11/22/2009      Assessment/Plan HTN B/p ranging in the 150's/90's-180's/90's; HR in the 70's-80's. Exam findings negative. Recently started on Lisinopril 10 mg tablet daily. Add Hydrochlorothiazide 12.5 mg Tablet daily. Clonidine 0.1 mg tablet one by mouth every 8 Hours PRN for Sys B/p > 160.   Migraine Headache  Has Imitrex but has not request Nurse for medication. Encourage to request for medication. Continue Imitrex. Continue to monitor.    Family/ staff Communication: Reviewed plan of care with patient and facility Nurse supervisor.  Labs/tests ordered:  None

## 2015-10-04 ENCOUNTER — Non-Acute Institutional Stay (SKILLED_NURSING_FACILITY): Payer: PPO | Admitting: Family

## 2015-10-04 DIAGNOSIS — E46 Unspecified protein-calorie malnutrition: Secondary | ICD-10-CM

## 2015-10-04 DIAGNOSIS — L989 Disorder of the skin and subcutaneous tissue, unspecified: Secondary | ICD-10-CM

## 2015-10-04 MED ORDER — SACCHAROMYCES BOULARDII 250 MG PO CAPS: 250.0000 mg | ORAL_CAPSULE | Freq: Two times a day (BID) | ORAL | Status: DC

## 2015-10-04 MED ORDER — DOXYCYCLINE HYCLATE 100 MG PO TABS: 100.0000 mg | ORAL_TABLET | Freq: Two times a day (BID) | ORAL | 0 refills | Status: DC

## 2015-10-04 NOTE — Progress Notes (Signed)
Location:   [facility]  Nursing Home Room Number: 601 Place of Service:  SNF (31) Provider:  Evalina Tabak FNP-C   Astrid Divine, MD  Patient Care Team: Maurice Small, MD as PCP - General (Family Medicine)  Extended Emergency Contact Information Primary Emergency Contact: Stultz,[name] Address: [address]          [place], [place] Home Phone: [phone] Work Phone: [phone] Relation: Friend Secondary Emergency Contact: Butler,Connie Address: [address]          [place], [place] [address] [place] Home Phone: [phone] Relation: None  Code Status: DNR  Goals of care: Advanced Directive information Advanced Directives 09/30/2015  Does patient have an advance directive? Yes  Type of Advance Directive Out of facility DNR (pink MOST or yellow form)  Does patient want to make changes to advanced directive? -  Copy of advanced directive(s) in chart? Yes  Would patient like information on creating an advanced directive? -  Pre-existing out of facility DNR order (yellow form or pink MOST form) -     Chief Complaint  Patient presents with  . Acute Visit    abnromal labs    HPI:  Pt is a 70 y.o. female seen today at [facility] for an acute visit for evaluation of abnormal lab results. She is seen in her room today. She complains of open sores on her face, abdomen and legs. She states has had since recent hospital visit.She has been scratching open areas. She is concerned due to drainage on sore on the abdomen and thigh area. She denies any fever or chills. Her recent lab results showed TP 5.1, Alb 3.10 ( 10/03/2015). Facility staff reports no new concerns.    Past Medical History:  Diagnosis Date  . Arthritis   . Back pain   . Bipolar 1 disorder (HCC)   . Complication of anesthesia    hard to wake up anesthesia  . Depression   . GERD (gastroesophageal reflux disease)   .  Headache(784.0)    migraines and tension headaches  . Hyperlipidemia   . Hypertension   . Hypothyroidism   . Thyroid disease    Past Surgical History:  Procedure Laterality Date  . APPENDECTOMY    . BACK SURGERY    . EYE SURGERY Bilateral    lens implant  . GASTRIC BYPASS    . LUMBAR LAMINECTOMY/DECOMPRESSION MICRODISCECTOMY Right 07/23/2012   Procedure: LUMBAR LAMINECTOMY/DECOMPRESSION MICRODISCECTOMY 2 LEVELS;  Surgeon: Karn Cassis, MD;  Location: [facility];  Service: Neurosurgery;  Laterality: Right;  Right Lumbar three-four  lumbar four-five Laminectomy/Foraminotomy  . NASAL SINUS SURGERY Bilateral   . TONSILLECTOMY    . WRIST SURGERY      Allergies  Allergen Reactions  . Gabapentin Other (See Comments)    Right foot and leg swelled   . Cefadroxil Itching    Ends of hair itched       Medication List       Accurate as of 10/04/15  2:40 PM. Always use your most recent med list.          bisacodyl 5 MG EC tablet Commonly known as:  DULCOLAX Take 1 tablet (5 mg total) by mouth daily as needed for moderate constipation.   cholecalciferol 1000 units tablet Commonly known as:  VITAMIN D Take 1,000 Units by mouth daily.   cloNIDine 0.1 MG tablet Commonly known as:  CATAPRES Take 1  tablet (0.1 mg total) by mouth 3 (three) times daily as needed. For SYS B/P > 160   DULoxetine 60 MG capsule Commonly known as:  CYMBALTA Take 60 mg by mouth every evening.   DULoxetine 30 MG capsule Commonly known as:  CYMBALTA Take 30 mg by mouth every morning.   hydrochlorothiazide 12.5 MG tablet Commonly known as:  HYDRODIURIL Take 1 tablet (12.5 mg total) by mouth daily.   HYDROcodone-acetaminophen 5-325 MG tablet Commonly known as:  NORCO/VICODIN Take 1-2 tablets by mouth every 6 (six) hours as needed for moderate pain or severe pain.   levothyroxine 75 MCG tablet Commonly known as:  SYNTHROID, LEVOTHROID Take 75 mcg by mouth daily before breakfast.   lisinopril  10 MG tablet Commonly known as:  PRINIVIL,ZESTRIL Take 10 mg by mouth. Take one tablet daily for blood pressure -hold for SBP <110   meloxicam 15 MG tablet Commonly known as:  MOBIC Take 15 mg by mouth daily.   omeprazole 10 MG capsule Commonly known as:  PRILOSEC Take 10 mg by mouth 2 (two) times daily.   polyethylene glycol packet Commonly known as:  MIRALAX / GLYCOLAX Take 17 g by mouth daily as needed for mild constipation.   pravastatin 20 MG tablet Commonly known as:  PRAVACHOL Take 20 mg by mouth at bedtime.   predniSONE 50 MG tablet Commonly known as:  DELTASONE 40 mg x 2 days, 30 mg x2 days, 20 mg x2 days, 10 mgx 2 days then stop   pregabalin 100 MG capsule Commonly known as:  LYRICA Take 1 capsule (100 mg total) by mouth 2 (two) times daily.   SUMAtriptan 25 MG tablet Commonly known as:  IMITREX Take 25 mg by mouth every 2 (two) hours as needed for migraine.   traZODone 100 MG tablet Commonly known as:  DESYREL Take 300 mg by mouth at bedtime.       Review of Systems  Constitutional: Negative for activity change, appetite change, chills, fatigue and fever.  HENT: Negative for congestion, rhinorrhea, sinus pressure, sneezing and sore throat.   Eyes: Negative.   Respiratory: Negative for cough, chest tightness, shortness of breath and wheezing.   Cardiovascular: Negative for chest pain, palpitations and leg swelling.  Gastrointestinal: Negative for abdominal distention, abdominal pain, constipation, diarrhea, nausea and vomiting.  Genitourinary: Negative for dysuria, flank pain, frequency and urgency.  Musculoskeletal: Positive for gait problem.  Skin: Negative for color change, pallor and rash.       Open sores on face, Abdomen and legs   Neurological: Negative for dizziness, seizures, syncope, light-headedness and headaches.  Psychiatric/Behavioral: Negative for agitation, confusion, hallucinations and sleep disturbance. The patient is not nervous/anxious.      Immunization History  Administered Date(s) Administered  . Influenza Whole 11/17/2008, 12/26/2009  . Td 03/11/2009   Pertinent  Health Maintenance Due  Topic Date Due  . COLONOSCOPY  11/24/1996  . DEXA SCAN  11/25/2011  . PNA vac Low Risk Adult (1 of 2 - PCV13) 11/25/2011  . INFLUENZA VACCINE  09/06/2015  . MAMMOGRAM  11/21/2016      Vitals:   10/04/15 1420  BP: 139/83  Pulse: 88  Resp: 20  Temp: 98.6 F (37 C)  SpO2: 95%  Weight: 266 lb (120.7 kg)  Height: 5\' 3"  (1.6 m)   Body mass index is 47.12 kg/m. Physical Exam  Constitutional: She is oriented to person, place, and time. She appears well-developed and well-nourished. No distress.  HENT:  Head: Normocephalic.  Mouth/Throat: Oropharynx is clear and moist. No oropharyngeal exudate.  Eyes: Conjunctivae and EOM are normal. Pupils are equal, round, and reactive to light. Right eye exhibits no discharge. Left eye exhibits no discharge.  Neck: Normal range of motion. No JVD present. No thyromegaly present.  Cardiovascular: Normal rate and regular rhythm.   Pulmonary/Chest: Effort normal and breath sounds normal. No respiratory distress. She has no wheezes. She has no rales.  Abdominal: Soft. Bowel sounds are normal. She exhibits no distension. There is no tenderness. There is no rebound and no guarding.  Musculoskeletal: She exhibits no edema, tenderness or deformity.  Unsteady gait. Uses wheelchair   Lymphadenopathy:    She has no cervical adenopathy.  Neurological: She is oriented to person, place, and time.  Skin: Skin is warm and dry. No rash noted. She is not diaphoretic. No pallor.  Scattered open areas on face, Abdomen, thighs and Legs.Abdomen/thigh area lesion red with serous drainage noted.   Psychiatric: She has a normal mood and affect.    Labs reviewed:  Recent Labs  09/23/15 1908 09/25/15 0700  NA 137 140  K 4.3 3.7  CL 99* 102  CO2 31 33*  GLUCOSE 112* 116*  BUN 13 13  CREATININE 0.81  0.77  CALCIUM 9.1 8.8*    Recent Labs  09/23/15 1908  AST 21  ALT 10*  ALKPHOS 78  BILITOT 1.2  PROT 7.4  ALBUMIN 3.9    Recent Labs  09/23/15 1908 09/25/15 0700  WBC 13.4* 10.0  NEUTROABS 10.1*  --   HGB 13.3 11.4*  HCT 41.7 37.1  MCV 95.6 99.2  PLT 282 260   Lab Results  Component Value Date   TSH 3.287 09/24/2015   No results found for: HGBA1C Lab Results  Component Value Date   CHOL 196 11/22/2009   HDL 45 11/22/2009   LDLCALC 120 (H) 11/22/2009   TRIG 156 (H) 11/22/2009   CHOLHDL 4.4 Ratio 11/22/2009    Assessment/Plan 1. Skin lesion Scattered on Arms, face, abdomen and legs.Abdomen and right thigh lesion redness and Drainage noted. Will benefit from ABx.Start Doxycycline 100 mg tablet twice daily X 7 days. Continue to monitor.Change bathing soap to antibacterial dial. Facility Wound Nurse to evaluate and treat abdominal and thigh open lesion.   2. Protein-calorie malnutrition (HCC) TP 5.1, Alb 3.10 ( 10/03/2015). RD consult. Encourage oral intake.     Family/ staff Communication: Reviewed plan of care with patient and facility Nurse supervisor.   Labs/tests ordered:  None

## 2015-10-06 DIAGNOSIS — F329 Major depressive disorder, single episode, unspecified: Secondary | ICD-10-CM | POA: Diagnosis not present

## 2015-10-06 DIAGNOSIS — F419 Anxiety disorder, unspecified: Secondary | ICD-10-CM | POA: Diagnosis not present

## 2015-10-07 DIAGNOSIS — M6281 Muscle weakness (generalized): Secondary | ICD-10-CM | POA: Diagnosis not present

## 2015-10-07 DIAGNOSIS — M79661 Pain in right lower leg: Secondary | ICD-10-CM | POA: Diagnosis not present

## 2015-10-07 DIAGNOSIS — M79662 Pain in left lower leg: Secondary | ICD-10-CM | POA: Diagnosis not present

## 2015-10-07 DIAGNOSIS — R278 Other lack of coordination: Secondary | ICD-10-CM | POA: Diagnosis not present

## 2015-10-07 DIAGNOSIS — Z9181 History of falling: Secondary | ICD-10-CM | POA: Diagnosis not present

## 2015-10-07 DIAGNOSIS — R2681 Unsteadiness on feet: Secondary | ICD-10-CM | POA: Diagnosis not present

## 2015-10-07 DIAGNOSIS — R262 Difficulty in walking, not elsewhere classified: Secondary | ICD-10-CM | POA: Diagnosis not present

## 2015-10-12 ENCOUNTER — Other Ambulatory Visit: Payer: Self-pay | Admitting: *Deleted

## 2015-10-12 MED ORDER — HYDROCODONE-ACETAMINOPHEN 5-325 MG PO TABS: ORAL_TABLET | ORAL | 0 refills | Status: DC

## 2015-10-12 NOTE — Telephone Encounter (Signed)
[facility] [phone] Fax: [phone]  

## 2015-10-14 ENCOUNTER — Non-Acute Institutional Stay (SKILLED_NURSING_FACILITY): Payer: PPO | Admitting: Family

## 2015-10-14 ENCOUNTER — Encounter: Payer: Self-pay | Admitting: Family

## 2015-10-14 DIAGNOSIS — E039 Hypothyroidism, unspecified: Secondary | ICD-10-CM

## 2015-10-14 DIAGNOSIS — F319 Bipolar disorder, unspecified: Secondary | ICD-10-CM

## 2015-10-14 DIAGNOSIS — M797 Fibromyalgia: Secondary | ICD-10-CM | POA: Diagnosis not present

## 2015-10-14 DIAGNOSIS — G43009 Migraine without aura, not intractable, without status migrainosus: Secondary | ICD-10-CM | POA: Diagnosis not present

## 2015-10-14 DIAGNOSIS — I10 Essential (primary) hypertension: Secondary | ICD-10-CM | POA: Diagnosis not present

## 2015-10-14 DIAGNOSIS — R531 Weakness: Secondary | ICD-10-CM | POA: Diagnosis not present

## 2015-10-14 DIAGNOSIS — F32A Depression, unspecified: Secondary | ICD-10-CM

## 2015-10-14 DIAGNOSIS — F329 Major depressive disorder, single episode, unspecified: Secondary | ICD-10-CM

## 2015-10-14 NOTE — Progress Notes (Signed)
Location:   [facility]  Nursing Home Room Number: 601 Place of Service:  SNF [phone])  Provider: Richarda Blade, FNP-C  PCP: Astrid Divine, MD Patient Care Team: Maurice Small, MD as PCP - General (Family Medicine)  Extended Emergency Contact Information Primary Emergency Contact: Stultz,[name] Address: [address]          [place], [place] Home Phone: [phone] Work Phone: [phone] Relation: Friend Secondary Emergency Contact: Butler,Connie Address: [address]          [place], [place] [address] [place] Home Phone: [phone] Relation: None  Code Status: Full Code Goals of care:  Advanced Directive information Advanced Directives 10/14/2015  Does patient have an advance directive? Yes  Type of Advance Directive Out of facility DNR (pink MOST or yellow form)  Does patient want to make changes to advanced directive? -  Copy of advanced directive(s) in chart? Yes  Would patient like information on creating an advanced directive? -  Pre-existing out of facility DNR order (yellow form or pink MOST form) Yellow form placed in chart (order not valid for inpatient use)     Allergies  Allergen Reactions  . Gabapentin Other (See Comments)    Right foot and leg swelled   . Cefadroxil Itching    Ends of hair itched     Chief Complaint  Patient presents with  . Discharge Note    discharge home    HPI:  70 y.o. female seen today at [facility] for discharge home. She was here for short term rehabilitation post hospital admission from 09/23/15-09/27/15 post fall with generalized weakness. She complained of bilateral leg pain. CT head was negative for acute intracranial abnormalities and infectious etiology was ruled out. X-rays ruled out acute fracture and dislocation. DVT was ruled out. She was started on steroid taper to help with her pain with her history of fibromyalgia. She has a medical  history of HTN, fibromyalgia, Hypothyroidism, Bipolar, Hyperlipidemia, Depression, GERD among other conditions. She is seen in her room today. She denies any acute issues this visit. On admission to rehab her blood pressure was elevated ranging in the 150'/90's-180's/90's and also had migraine headaches. HZCT 12.5 mg tablet added and encouraged to request for her imetrex. B/P now stable ranging in 110's/60's-130's/80's. She has worked well with facility PT/OT now stable for discharge home.She will be discharged home with Home health PT/OT to continue with ROM, Exercise, Gait stability and muscle strengthening. She does not require any DME states has own walker at home. Home health services will be arranged by facility social worker prior to discharge. Prescription medication will be written x 1 month then patient to follow up with PCP in 1-2 weeks. Facility staff report no new concerns.      Past Medical History:  Diagnosis Date  . Arthritis   . Back pain   . Bipolar 1 disorder (HCC)   . Complication of anesthesia    hard to wake up anesthesia  . Depression   . GERD (gastroesophageal reflux disease)   . Headache(784.0)    migraines and tension headaches  . Hyperlipidemia   . Hypertension   . Hypothyroidism   . Thyroid disease     Past Surgical History:  Procedure Laterality Date  . APPENDECTOMY    . BACK SURGERY    . EYE SURGERY Bilateral    lens implant  . GASTRIC BYPASS    . LUMBAR LAMINECTOMY/DECOMPRESSION MICRODISCECTOMY  Right 07/23/2012   Procedure: LUMBAR LAMINECTOMY/DECOMPRESSION MICRODISCECTOMY 2 LEVELS;  Surgeon: Karn Cassis, MD;  Location: [facility];  Service: Neurosurgery;  Laterality: Right;  Right Lumbar three-four  lumbar four-five Laminectomy/Foraminotomy  . NASAL SINUS SURGERY Bilateral   . TONSILLECTOMY    . WRIST SURGERY        reports that she has never smoked. She has never used smokeless tobacco. She reports that she drinks alcohol. She reports that she  does not use drugs. Social History   Social History  . Marital status: Widowed    Spouse name: N/A  . Number of children: N/A  . Years of education: N/A   Occupational History  . Not on file.   Social History Main Topics  . Smoking status: Never Smoker  . Smokeless tobacco: Never Used  . Alcohol use Yes     Comment: social  . Drug use: No  . Sexual activity: Not on file   Other Topics Concern  . Not on file   Social History Narrative  . No narrative on file      Allergies  Allergen Reactions  . Gabapentin Other (See Comments)    Right foot and leg swelled   . Cefadroxil Itching    Ends of hair itched     Pertinent  Health Maintenance Due  Topic Date Due  . COLONOSCOPY  11/24/1996  . DEXA SCAN  11/25/2011  . PNA vac Low Risk Adult (1 of 2 - PCV13) 11/25/2011  . INFLUENZA VACCINE  09/06/2015  . MAMMOGRAM  11/21/2016    Medications:   Medication List       Accurate as of 10/14/15  3:19 PM. Always use your most recent med list.          bisacodyl 5 MG EC tablet Commonly known as:  DULCOLAX Take 1 tablet (5 mg total) by mouth daily as needed for moderate constipation.   cholecalciferol 1000 units tablet Commonly known as:  VITAMIN D Take 1,000 Units by mouth daily.   cloNIDine 0.1 MG tablet Commonly known as:  CATAPRES Take 0.1 mg by mouth. Take one tablet every 8 hours as needed if systolic BP >160   DULoxetine 60 MG capsule Commonly known as:  CYMBALTA Take 60 mg by mouth every evening.   DULoxetine 30 MG capsule Commonly known as:  CYMBALTA Take 30 mg by mouth every morning.   hydrochlorothiazide 12.5 MG tablet Commonly known as:  HYDRODIURIL Take 1 tablet (12.5 mg total) by mouth daily.   HYDROcodone-acetaminophen 5-325 MG tablet Commonly known as:  NORCO/VICODIN Take one tablet by mouth every 6 hours as needed for moderate pain; Take two tablets by mouth every 6 hours as needed for severe pain.   levothyroxine 75 MCG tablet Commonly  known as:  SYNTHROID, LEVOTHROID Take 75 mcg by mouth daily before breakfast.   lisinopril 10 MG tablet Commonly known as:  PRINIVIL,ZESTRIL Take 10 mg by mouth. Take one tablet daily for blood pressure -hold for SBP <110   meloxicam 15 MG tablet Commonly known as:  MOBIC Take 15 mg by mouth daily.   polyethylene glycol packet Commonly known as:  MIRALAX / GLYCOLAX Take 17 g by mouth daily as needed for mild constipation.   pravastatin 20 MG tablet Commonly known as:  PRAVACHOL Take 20 mg by mouth at bedtime.   pregabalin 100 MG capsule Commonly known as:  LYRICA Take 1 capsule (100 mg total) by mouth 2 (two) times daily.   saccharomyces  boulardii 250 MG capsule Commonly known as:  FLORASTOR Take 1 capsule (250 mg total) by mouth 2 (two) times daily.   SUMAtriptan 25 MG tablet Commonly known as:  IMITREX Take 25 mg by mouth every 2 (two) hours as needed for migraine.   traZODone 100 MG tablet Commonly known as:  DESYREL Take 300 mg by mouth at bedtime.       Review of Systems  Constitutional: Negative for activity change, appetite change, chills, fatigue and fever.  HENT: Negative for congestion, rhinorrhea, sinus pressure, sneezing and sore throat.   Eyes: Negative.   Respiratory: Negative for cough, chest tightness, shortness of breath and wheezing.   Cardiovascular: Negative for chest pain, palpitations and leg swelling.  Gastrointestinal: Negative for abdominal distention, abdominal pain, constipation, diarrhea, nausea and vomiting.  Endocrine: Negative.   Genitourinary: Negative for dysuria, flank pain, frequency and urgency.  Musculoskeletal: Positive for arthralgias and gait problem.  Skin: Negative for color change, pallor and rash.  Neurological: Negative for dizziness, seizures, syncope, light-headedness and headaches.  Hematological: Does not bruise/bleed easily.  Psychiatric/Behavioral: Negative for agitation, confusion, hallucinations and sleep  disturbance. The patient is not nervous/anxious.     Vitals:   10/14/15 1027  BP: 134/86  Pulse: 80  Temp: 97.9 F (36.6 C)  SpO2: 97%  Weight: 269 lb (122 kg)  Height: 5\' 3"  (1.6 m)   Body mass index is 47.65 kg/m. Physical Exam  Constitutional: She is oriented to person, place, and time. She appears well-developed and well-nourished. No distress.  HENT:  Head: Normocephalic.  Mouth/Throat: Oropharynx is clear and moist. No oropharyngeal exudate.  Eyes: Conjunctivae and EOM are normal. Pupils are equal, round, and reactive to light. Right eye exhibits no discharge. Left eye exhibits no discharge.  Neck: Normal range of motion. No JVD present. No thyromegaly present.  Cardiovascular: Normal rate and regular rhythm.   Pulmonary/Chest: Effort normal and breath sounds normal. No respiratory distress. She has no wheezes. She has no rales.  Abdominal: Soft. Bowel sounds are normal. She exhibits no distension. There is no tenderness. There is no rebound and no guarding.  Genitourinary:  Genitourinary Comments: Continent   Musculoskeletal: She exhibits no edema, tenderness or deformity.  Unsteady gait. Uses wheelchair   Lymphadenopathy:    She has no cervical adenopathy.  Neurological: She is oriented to person, place, and time.  Skin: Skin is warm and dry. No rash noted. She is not diaphoretic. No pallor.  Psychiatric: She has a normal mood and affect.    Labs reviewed: Basic Metabolic Panel:  Recent Labs  16/11/9606/18/17 1908 09/25/15 0700  NA 137 140  K 4.3 3.7  CL 99* 102  CO2 31 33*  GLUCOSE 112* 116*  BUN 13 13  CREATININE 0.81 0.77  CALCIUM 9.1 8.8*   Liver Function Tests:  Recent Labs  09/23/15 1908  AST 21  ALT 10*  ALKPHOS 78  BILITOT 1.2  PROT 7.4  ALBUMIN 3.9   CBC:  Recent Labs  09/23/15 1908 09/25/15 0700  WBC 13.4* 10.0  NEUTROABS 10.1*  --   HGB 13.3 11.4*  HCT 41.7 37.1  MCV 95.6 99.2  PLT 282 260   Cardiac Enzymes:  Recent Labs   09/23/15 1908  CKTOTAL 94   CBG:  Recent Labs  09/26/15 0758 09/27/15 0803  GLUCAP 137* 87   Assessment/Plan:   HTN Elevated during admission to Rehab but now stable. Continue on HZCT 12.5 mg Tablet, Lisinopril and Clonidine PRN for Sys >  160. CBC, BMP in 1-2 weeks with PCP   Migraine Continue on Imitrex as needed   Hypothyroidism Continue on Levothyroxine 12.5 mcg Tablet daily. Monitor TSH  Bipolar  Continue on Trazodone and Cymbalta.   Generalized weakness  Much improvement. Continue on PT/OT.   Depression Stable.Continue on Cymbalta and Trazodone  Fibromylagia  Continue on Lyrica and Hydrocodone-APAP  Unsteady Gait  Has worked well with PT/ OT. Will discharge home PT/OT to continue with ROM, Exercise, Gait stability and muscle strengthening. No DME required has own walker at home.Fall and safety precautions.    Patient is being discharged with the following home health services:    PT/OT to continue with ROM, Exercise, Gait stability and muscle strengthening  Patient is being discharged with the following durable medical equipment:   No DME required has own walker.   Patient has been advised to f/u with their PCP in 1-2 weeks to bring them up to date on their rehab stay.  Social services at facility was responsible for arranging this appointment.  Pt was provided with a 30 day supply of prescriptions for medications and refills must be obtained from their PCP.  For controlled substances, a more limited supply may be provided adequate until PCP appointment only.  Future labs/tests needed:  CBC, BMP in 1-2 weeks with PCP

## 2015-10-18 DIAGNOSIS — M545 Low back pain: Secondary | ICD-10-CM | POA: Diagnosis not present

## 2015-10-18 DIAGNOSIS — M797 Fibromyalgia: Secondary | ICD-10-CM | POA: Diagnosis not present

## 2015-10-18 DIAGNOSIS — R2689 Other abnormalities of gait and mobility: Secondary | ICD-10-CM | POA: Diagnosis not present

## 2015-10-18 DIAGNOSIS — E785 Hyperlipidemia, unspecified: Secondary | ICD-10-CM | POA: Diagnosis not present

## 2015-10-18 DIAGNOSIS — I10 Essential (primary) hypertension: Secondary | ICD-10-CM | POA: Diagnosis not present

## 2015-10-18 DIAGNOSIS — E039 Hypothyroidism, unspecified: Secondary | ICD-10-CM | POA: Diagnosis not present

## 2015-10-18 DIAGNOSIS — E786 Lipoprotein deficiency: Secondary | ICD-10-CM | POA: Diagnosis not present

## 2015-10-18 DIAGNOSIS — M6281 Muscle weakness (generalized): Secondary | ICD-10-CM | POA: Diagnosis not present

## 2015-10-18 DIAGNOSIS — F319 Bipolar disorder, unspecified: Secondary | ICD-10-CM | POA: Diagnosis not present

## 2015-10-18 DIAGNOSIS — G43909 Migraine, unspecified, not intractable, without status migrainosus: Secondary | ICD-10-CM | POA: Diagnosis not present

## 2015-10-18 DIAGNOSIS — K219 Gastro-esophageal reflux disease without esophagitis: Secondary | ICD-10-CM | POA: Diagnosis not present

## 2015-10-18 DIAGNOSIS — M79661 Pain in right lower leg: Secondary | ICD-10-CM | POA: Diagnosis not present

## 2015-10-31 DIAGNOSIS — M797 Fibromyalgia: Secondary | ICD-10-CM | POA: Diagnosis not present

## 2015-10-31 DIAGNOSIS — F319 Bipolar disorder, unspecified: Secondary | ICD-10-CM | POA: Diagnosis not present

## 2015-10-31 DIAGNOSIS — M1990 Unspecified osteoarthritis, unspecified site: Secondary | ICD-10-CM | POA: Diagnosis not present

## 2015-10-31 DIAGNOSIS — Z23 Encounter for immunization: Secondary | ICD-10-CM | POA: Diagnosis not present

## 2015-10-31 DIAGNOSIS — E669 Obesity, unspecified: Secondary | ICD-10-CM | POA: Diagnosis not present

## 2015-11-02 ENCOUNTER — Other Ambulatory Visit: Payer: Self-pay | Admitting: Family Medicine

## 2015-11-02 DIAGNOSIS — Z1231 Encounter for screening mammogram for malignant neoplasm of breast: Secondary | ICD-10-CM

## 2015-11-23 ENCOUNTER — Ambulatory Visit: Payer: PPO

## 2015-12-15 ENCOUNTER — Ambulatory Visit: Payer: PPO

## 2015-12-20 DIAGNOSIS — G43909 Migraine, unspecified, not intractable, without status migrainosus: Secondary | ICD-10-CM | POA: Diagnosis not present

## 2015-12-20 DIAGNOSIS — R2689 Other abnormalities of gait and mobility: Secondary | ICD-10-CM | POA: Diagnosis not present

## 2015-12-20 DIAGNOSIS — M545 Low back pain: Secondary | ICD-10-CM | POA: Diagnosis not present

## 2015-12-20 DIAGNOSIS — M6281 Muscle weakness (generalized): Secondary | ICD-10-CM | POA: Diagnosis not present

## 2015-12-20 DIAGNOSIS — E785 Hyperlipidemia, unspecified: Secondary | ICD-10-CM | POA: Diagnosis not present

## 2015-12-20 DIAGNOSIS — M797 Fibromyalgia: Secondary | ICD-10-CM | POA: Diagnosis not present

## 2015-12-20 DIAGNOSIS — K219 Gastro-esophageal reflux disease without esophagitis: Secondary | ICD-10-CM | POA: Diagnosis not present

## 2015-12-20 DIAGNOSIS — M79661 Pain in right lower leg: Secondary | ICD-10-CM | POA: Diagnosis not present

## 2015-12-20 DIAGNOSIS — E039 Hypothyroidism, unspecified: Secondary | ICD-10-CM | POA: Diagnosis not present

## 2015-12-20 DIAGNOSIS — F319 Bipolar disorder, unspecified: Secondary | ICD-10-CM | POA: Diagnosis not present

## 2015-12-20 DIAGNOSIS — I10 Essential (primary) hypertension: Secondary | ICD-10-CM | POA: Diagnosis not present

## 2016-01-19 ENCOUNTER — Ambulatory Visit: Payer: PPO

## 2016-02-21 ENCOUNTER — Ambulatory Visit
Admission: RE | Admit: 2016-02-21 | Discharge: 2016-02-21 | Disposition: A | Discharge status: Home / Self Care | Payer: PPO | Source: Ambulatory Visit | Attending: Family Medicine | Admitting: Family Medicine

## 2016-02-21 DIAGNOSIS — Z1231 Encounter for screening mammogram for malignant neoplasm of breast: Secondary | ICD-10-CM

## 2016-03-20 ENCOUNTER — Ambulatory Visit: Payer: PPO | Admitting: Registered"

## 2016-03-23 ENCOUNTER — Emergency Department (HOSPITAL_COMMUNITY): Payer: PPO

## 2016-03-23 ENCOUNTER — Encounter (HOSPITAL_COMMUNITY): Payer: Self-pay | Admitting: Vascular Surgery

## 2016-03-23 ENCOUNTER — Inpatient Hospital Stay (HOSPITAL_COMMUNITY)
Admission: EM | Admit: 2016-03-23 | Discharge: 2016-03-26 | DRG: 682 | Disposition: A | Discharge status: Home / Self Care | Payer: PPO | Attending: Internal Medicine | Admitting: Internal Medicine

## 2016-03-23 DIAGNOSIS — J9601 Acute respiratory failure with hypoxia: Secondary | ICD-10-CM | POA: Diagnosis not present

## 2016-03-23 DIAGNOSIS — T59811A Toxic effect of smoke, accidental (unintentional), initial encounter: Secondary | ICD-10-CM | POA: Diagnosis not present

## 2016-03-23 DIAGNOSIS — G8929 Other chronic pain: Secondary | ICD-10-CM | POA: Diagnosis not present

## 2016-03-23 DIAGNOSIS — I714 Abdominal aortic aneurysm, without rupture: Secondary | ICD-10-CM | POA: Diagnosis present

## 2016-03-23 DIAGNOSIS — K219 Gastro-esophageal reflux disease without esophagitis: Secondary | ICD-10-CM | POA: Diagnosis not present

## 2016-03-23 DIAGNOSIS — R05 Cough: Secondary | ICD-10-CM | POA: Diagnosis not present

## 2016-03-23 DIAGNOSIS — E7800 Pure hypercholesterolemia, unspecified: Secondary | ICD-10-CM | POA: Diagnosis not present

## 2016-03-23 DIAGNOSIS — I712 Thoracic aortic aneurysm, without rupture: Secondary | ICD-10-CM | POA: Diagnosis present

## 2016-03-23 DIAGNOSIS — M797 Fibromyalgia: Secondary | ICD-10-CM | POA: Diagnosis present

## 2016-03-23 DIAGNOSIS — R0902 Hypoxemia: Secondary | ICD-10-CM

## 2016-03-23 DIAGNOSIS — J208 Acute bronchitis due to other specified organisms: Secondary | ICD-10-CM | POA: Diagnosis not present

## 2016-03-23 DIAGNOSIS — I959 Hypotension, unspecified: Secondary | ICD-10-CM | POA: Diagnosis not present

## 2016-03-23 DIAGNOSIS — R55 Syncope and collapse: Secondary | ICD-10-CM | POA: Diagnosis not present

## 2016-03-23 DIAGNOSIS — M549 Dorsalgia, unspecified: Secondary | ICD-10-CM | POA: Diagnosis present

## 2016-03-23 DIAGNOSIS — E785 Hyperlipidemia, unspecified: Secondary | ICD-10-CM | POA: Diagnosis present

## 2016-03-23 DIAGNOSIS — E039 Hypothyroidism, unspecified: Secondary | ICD-10-CM | POA: Diagnosis present

## 2016-03-23 DIAGNOSIS — R059 Cough, unspecified: Secondary | ICD-10-CM

## 2016-03-23 DIAGNOSIS — Z79899 Other long term (current) drug therapy: Secondary | ICD-10-CM | POA: Diagnosis not present

## 2016-03-23 DIAGNOSIS — N179 Acute kidney failure, unspecified: Secondary | ICD-10-CM | POA: Diagnosis not present

## 2016-03-23 DIAGNOSIS — R404 Transient alteration of awareness: Secondary | ICD-10-CM | POA: Diagnosis not present

## 2016-03-23 DIAGNOSIS — Z9884 Bariatric surgery status: Secondary | ICD-10-CM | POA: Diagnosis not present

## 2016-03-23 DIAGNOSIS — J9600 Acute respiratory failure, unspecified whether with hypoxia or hypercapnia: Secondary | ICD-10-CM | POA: Diagnosis present

## 2016-03-23 DIAGNOSIS — Y92000 Kitchen of unspecified non-institutional (private) residence as  the place of occurrence of the external cause: Secondary | ICD-10-CM

## 2016-03-23 DIAGNOSIS — J680 Bronchitis and pneumonitis due to chemicals, gases, fumes and vapors: Secondary | ICD-10-CM | POA: Diagnosis not present

## 2016-03-23 DIAGNOSIS — R531 Weakness: Secondary | ICD-10-CM | POA: Diagnosis not present

## 2016-03-23 DIAGNOSIS — I10 Essential (primary) hypertension: Secondary | ICD-10-CM | POA: Diagnosis present

## 2016-03-23 DIAGNOSIS — E032 Hypothyroidism due to medicaments and other exogenous substances: Secondary | ICD-10-CM | POA: Diagnosis not present

## 2016-03-23 DIAGNOSIS — E860 Dehydration: Secondary | ICD-10-CM | POA: Diagnosis present

## 2016-03-23 LAB — COOXEMETRY PANEL
Carboxyhemoglobin: 1.6 % — ABNORMAL HIGH (ref 0.5–1.5)
Methemoglobin: 1.6 % — ABNORMAL HIGH (ref 0.0–1.5)
O2 Saturation: 80.2 %
Total hemoglobin: 12.7 g/dL (ref 12.0–16.0)

## 2016-03-23 LAB — I-STAT TROPONIN, ED: Troponin i, poc: 0.01 ng/mL (ref 0.00–0.08)

## 2016-03-23 LAB — COMPREHENSIVE METABOLIC PANEL
ALT: 11 U/L — ABNORMAL LOW (ref 14–54)
AST: 21 U/L (ref 15–41)
Albumin: 3.7 g/dL (ref 3.5–5.0)
Alkaline Phosphatase: 85 U/L (ref 38–126)
Anion gap: 8 (ref 5–15)
BUN: 27 mg/dL — ABNORMAL HIGH (ref 6–20)
CO2: 28 mmol/L (ref 22–32)
Calcium: 8.9 mg/dL (ref 8.9–10.3)
Chloride: 100 mmol/L — ABNORMAL LOW (ref 101–111)
Creatinine, Ser: 1.11 mg/dL — ABNORMAL HIGH (ref 0.44–1.00)
GFR calc Af Amer: 57 mL/min — ABNORMAL LOW (ref >60)
GFR calc non Af Amer: 49 mL/min — ABNORMAL LOW (ref >60)
Glucose, Bld: 98 mg/dL (ref 65–99)
Potassium: 4.5 mmol/L (ref 3.5–5.1)
Sodium: 136 mmol/L (ref 135–145)
Total Bilirubin: 0.8 mg/dL (ref 0.3–1.2)
Total Protein: 6.2 g/dL — ABNORMAL LOW (ref 6.5–8.1)

## 2016-03-23 LAB — CBC WITH DIFFERENTIAL/PLATELET
Basophils Absolute: 0 10*3/uL (ref 0.0–0.1)
Basophils Relative: 0 %
Eosinophils Absolute: 0.2 10*3/uL (ref 0.0–0.7)
Eosinophils Relative: 2 %
HCT: 41.6 % (ref 36.0–46.0)
Hemoglobin: 12.9 g/dL (ref 12.0–15.0)
Lymphocytes Relative: 23 %
Lymphs Abs: 2 10*3/uL (ref 0.7–4.0)
MCH: 28.4 pg (ref 26.0–34.0)
MCHC: 31 g/dL (ref 30.0–36.0)
MCV: 91.4 fL (ref 78.0–100.0)
Monocytes Absolute: 0.6 10*3/uL (ref 0.1–1.0)
Monocytes Relative: 7 %
Neutro Abs: 5.8 10*3/uL (ref 1.7–7.7)
Neutrophils Relative %: 68 %
Platelets: 188 10*3/uL (ref 150–400)
RBC: 4.55 MIL/uL (ref 3.87–5.11)
RDW: 17 % — ABNORMAL HIGH (ref 11.5–15.5)
WBC: 8.5 10*3/uL (ref 4.0–10.5)

## 2016-03-23 LAB — CK: Total CK: 128 U/L (ref 38–234)

## 2016-03-23 MED ORDER — ACETAMINOPHEN 325 MG PO TABS
650.0000 mg | ORAL_TABLET | Freq: Once | ORAL | Status: AC
  Administered 2016-03-24: 650 mg via ORAL
  Filled 2016-03-23: qty 2

## 2016-03-23 NOTE — ED Notes (Signed)
Patient transported to X-ray 

## 2016-03-23 NOTE — ED Notes (Signed)
Pt ambulated with pulse ox, pt sats 85% on RA. Pt denies SOB or difficulty breathing. Pt ambulates appropriately. Pt does not wear O2 at home. Pt sats 95% on 2L O2.

## 2016-03-23 NOTE — ED Notes (Signed)
Pt placed on the bedpan

## 2016-03-23 NOTE — ED Notes (Signed)
Lab will add D-dimer

## 2016-03-23 NOTE — ED Provider Notes (Signed)
[facility]-EMERGENCY DEPT Provider Note   CSN: [id number] Arrival date & time: 03/23/16  1716     History   Chief Complaint Chief Complaint  Patient presents with  . Loss of Consciousness    HPI Lindsey Rowe is a 71 y.o. female.  The history is provided by the patient.  Loss of Consciousness   Chronicity: occurred once before last year while walking. Episode onset: 20 hrs ago. Episode frequency: once. The problem has been resolved. She lost consciousness for a period of greater than 5 minutes (approx 2 hrs). The problem is associated with normal activity (while cooking). Associated symptoms include headaches (for 1 week, resolved several days ago and has not returned since). Pertinent negatives include abdominal pain, bladder incontinence, bowel incontinence, chest pain, confusion, diaphoresis, dizziness, fever, focal weakness, light-headedness, malaise/fatigue, nausea, palpitations, slurred speech, vertigo and vomiting. She has tried nothing for the symptoms. Her past medical history is significant for HTN. Her past medical history does not include CAD, CVA, DM or seizures.   Reports that awoke and was unsure of the time, but it took her approx 3 hours to get from kitchen to the couch where she noted that it was 0330 am. States that the stove was still on and there was a lot of smoke.    Past Medical History:  Diagnosis Date  . Arthritis   . Back pain   . Bipolar 1 disorder (HCC)   . Complication of anesthesia    hard to wake up anesthesia  . Depression   . GERD (gastroesophageal reflux disease)   . Headache(784.0)    migraines and tension headaches  . Hyperlipidemia   . Hypertension   . Hypothyroidism   . Thyroid disease     Patient Active Problem List   Diagnosis Date Noted  . Syncope 03/24/2016  . Generalized weakness 09/24/2015  . Leg pain, bilateral 09/24/2015  . Depression 09/24/2015  . Loss of appetite 09/24/2015  . Leg weakness, bilateral   . Hypothyroid  12/28/2012  . Hypovolemia 12/28/2012  . Hypotension 08/13/2012  . Dehydration 08/13/2012  . AKI (acute kidney injury) (HCC) 08/13/2012  . Hypoventilation associated with obesity syndrome (HCC) 08/13/2012  . Chest pain 07/26/2012  . ANKLE SPRAIN, RIGHT 03/16/2010  . DIVERTICULOSIS, COLON 10/31/2009  . SKIN LESION 09/23/2009  . Shoulder pain, right 07/05/2009  . COUGH 03/29/2009  . Migraine 03/11/2009  . OTITIS MEDIA, RIGHT 12/13/2008  . BRUISE 12/06/2008  . ACID REFLUX DISEASE 09/13/2008  . ALLERGIC RHINITIS 05/20/2008  . UNSPECIFIED DISEASE OF HAIR AND HAIR FOLLICLES 05/20/2008  . DERMATITIS 11/12/2007  . OBESITY 07/18/2007  . HYPERCHOLESTEROLEMIA 05/13/2007  . BIPOLAR AFFECTIVE DISORDER 05/12/2007  . HYPERTENSION, BENIGN ESSENTIAL 05/12/2007  . Low back pain potentially associated with radiculopathy 05/12/2007    Past Surgical History:  Procedure Laterality Date  . APPENDECTOMY    . BACK SURGERY    . EYE SURGERY Bilateral    lens implant  . GASTRIC BYPASS    . LUMBAR LAMINECTOMY/DECOMPRESSION MICRODISCECTOMY Right 07/23/2012   Procedure: LUMBAR LAMINECTOMY/DECOMPRESSION MICRODISCECTOMY 2 LEVELS;  Surgeon: Karn Cassis, MD;  Location: [facility];  Service: Neurosurgery;  Laterality: Right;  Right Lumbar three-four  lumbar four-five Laminectomy/Foraminotomy  . NASAL SINUS SURGERY Bilateral   . TONSILLECTOMY    . WRIST SURGERY      OB History    No data available       Home Medications    Prior to Admission medications   Medication Sig Start  Date End Date Taking? Authorizing Provider  Cholecalciferol (D3-1000) 1000 units capsule Take 1,000 Units by mouth daily.   Yes Historical Provider, MD  DULoxetine (CYMBALTA) 30 MG capsule Take 30 mg by mouth every morning.    Yes Historical Provider, MD  DULoxetine (CYMBALTA) 60 MG capsule Take 60 mg by mouth every evening.    Yes Historical Provider, MD  levothyroxine (SYNTHROID, LEVOTHROID) 75 MCG tablet Take 75 mcg by  mouth daily before breakfast.  09/06/15  Yes Historical Provider, MD  lisinopril (PRINIVIL,ZESTRIL) 10 MG tablet Take 10 mg by mouth daily. Take one tablet daily for blood pressure -hold for SBP <110    Yes Historical Provider, MD  meloxicam (MOBIC) 15 MG tablet Take 15 mg by mouth daily.  08/23/15  Yes Historical Provider, MD  omeprazole (PRILOSEC) 20 MG capsule Take 20 mg by mouth 2 (two) times daily before a meal.   Yes Historical Provider, MD  pravastatin (PRAVACHOL) 20 MG tablet Take 20 mg by mouth at bedtime.  09/06/15  Yes Historical Provider, MD  pregabalin (LYRICA) 100 MG capsule Take 1 capsule (100 mg total) by mouth 2 (two) times daily. 09/27/15  Yes Joseph ArtJessica U Vann, DO  traZODone (DESYREL) 100 MG tablet Take 300 mg by mouth at bedtime.    Yes Historical Provider, MD  vitamin B-12 (CYANOCOBALAMIN) 1000 MCG tablet Take 1,000 mcg by mouth daily.   Yes Historical Provider, MD    Family History No family history on file.  Social History Social History  Substance Use Topics  . Smoking status: Never Smoker  . Smokeless tobacco: Never Used  . Alcohol use Yes     Comment: social     Allergies   Gabapentin and Cefadroxil   Review of Systems Review of Systems  Constitutional: Negative for diaphoresis, fever and malaise/fatigue.  Cardiovascular: Positive for syncope. Negative for chest pain and palpitations.  Gastrointestinal: Negative for abdominal pain, bowel incontinence, nausea and vomiting.  Genitourinary: Negative for bladder incontinence.  Neurological: Positive for headaches (for 1 week, resolved several days ago and has not returned since). Negative for dizziness, vertigo, focal weakness and light-headedness.  Psychiatric/Behavioral: Negative for confusion.  Ten systems are reviewed and are negative for acute change except as noted in the HPI    Physical Exam Updated Vital Signs BP 96/65   Pulse 85   Temp 98.4 F (36.9 C) (Oral)   Resp 17   SpO2 93%   Physical Exam    Constitutional: She is oriented to person, place, and time. She appears well-developed and well-nourished. No distress.  HENT:  Head: Normocephalic and atraumatic.  Nose: Nose normal.  Eyes: Conjunctivae and EOM are normal. Pupils are equal, round, and reactive to light. Right eye exhibits no discharge. Left eye exhibits no discharge. No scleral icterus.  Neck: Normal range of motion. Neck supple.  Cardiovascular: Normal rate and regular rhythm.  Exam reveals no gallop and no friction rub.   No murmur heard. Pulmonary/Chest: Effort normal and breath sounds normal. No stridor. No respiratory distress. She has no rales.  Abdominal: Soft. She exhibits no distension. There is no tenderness.  Musculoskeletal: She exhibits no edema or tenderness.  Neurological: She is alert and oriented to person, place, and time.  Skin: Skin is warm and dry. No rash noted. She is not diaphoretic. No erythema.  Psychiatric: She has a normal mood and affect.  Vitals reviewed.    ED Treatments / Results  Labs (all labs ordered are listed, but only abnormal  results are displayed) Labs Reviewed  CBC WITH DIFFERENTIAL/PLATELET - Abnormal; Notable for the following:       Result Value   RDW 17.0 (*)    All other components within normal limits  COMPREHENSIVE METABOLIC PANEL - Abnormal; Notable for the following:    Chloride 100 (*)    BUN 27 (*)    Creatinine, Ser 1.11 (*)    Total Protein 6.2 (*)    ALT 11 (*)    GFR calc non Af Amer 49 (*)    GFR calc Af Amer 57 (*)    All other components within normal limits  COOXEMETRY PANEL - Abnormal; Notable for the following:    Carboxyhemoglobin 1.6 (*)    Methemoglobin 1.6 (*)    All other components within normal limits  D-DIMER, QUANTITATIVE (NOT AT [facility]) - Abnormal; Notable for the following:    D-Dimer, Quant 0.71 (*)    All other components within normal limits  CK  CBC  BASIC METABOLIC PANEL  URINALYSIS, ROUTINE W REFLEX MICROSCOPIC  I-STAT  TROPOININ, ED    EKG  EKG Interpretation  Date/Time:  Friday March 23 2016 17:55:40 EST Ventricular Rate:  84 PR Interval:    QRS Duration: 101 QT Interval:  360 QTC Calculation: 426 R Axis:   67 Text Interpretation:  Sinus rhythm Low voltage with right axis deviation Nonspecific T wave abnormality Otherwise no significant change Confirmed by [facility] MD, Myana Schlup ([id number]) on 03/23/2016 6:06:59 PM       Radiology Dg Chest 2 View  Result Date: 03/23/2016 CLINICAL DATA:  71 year old female with syncopal episode last night while cooking at home. Headache for 5-7 days. Initial encounter. EXAM: CHEST  2 VIEW COMPARISON:  18 17 and earlier. FINDINGS: Stable lung volumes with mild elevation of the right hemidiaphragm. Stable left upper quadrant surgical clips. Normal cardiac size and mediastinal contours. Visualized tracheal air column is within normal limits. No pneumothorax, pulmonary edema, pleural effusion or confluent pulmonary opacity. No acute osseous abnormality identified. Negative visible bowel gas pattern. IMPRESSION: No acute cardiopulmonary abnormality. Electronically Signed   By: Odessa Fleming M.D.   On: 03/23/2016 18:59   Ct Head Wo Contrast  Result Date: 03/23/2016 CLINICAL DATA:  Syncopal episode last evening EXAM: CT HEAD WITHOUT CONTRAST TECHNIQUE: Contiguous axial images were obtained from the base of the skull through the vertex without intravenous contrast. COMPARISON:  03/17/2009 FINDINGS: BRAIN: There is sulcal and ventricular prominence consistent with mild chronic superficial and central atrophy. No intraparenchymal hemorrhage, mass effect nor midline shift. Periventricular and subcortical white matter hypodensities consistent with chronic small vessel ischemic disease are identified. No acute large vascular territory infarcts. No abnormal extra-axial fluid collections. Basal cisterns are not effaced and midline. VASCULAR: No hyperdense vessels or unexpected calcifications. SKULL:  No skull fracture. No significant scalp soft tissue swelling. SINUSES/ORBITS: The mastoid air-cells are clear. The included paranasal sinuses are well-aerated.The included ocular globes and orbital contents are non-suspicious. OTHER: None. IMPRESSION: Chronic minimal small vessel ischemic disease of periventricular white matter. Chronic cerebral atrophy. No acute intracranial abnormality. Electronically Signed   By: Tollie Eth M.D.   On: 03/23/2016 19:24    Procedures Procedures (including critical care time)  Medications Ordered in ED Medications  acetaminophen (TYLENOL) tablet 650 mg (not administered)  lisinopril (PRINIVIL,ZESTRIL) tablet 10 mg (not administered)  DULoxetine (CYMBALTA) DR capsule 30 mg (not administered)  DULoxetine (CYMBALTA) DR capsule 60 mg (not administered)  traZODone (DESYREL) tablet 300 mg (not administered)  pregabalin (  LYRICA) capsule 100 mg (not administered)  pravastatin (PRAVACHOL) tablet 20 mg (not administered)  levothyroxine (SYNTHROID, LEVOTHROID) tablet 75 mcg (not administered)  pantoprazole (PROTONIX) EC tablet 40 mg (not administered)  enoxaparin (LOVENOX) injection 40 mg (not administered)  ondansetron (ZOFRAN) tablet 4 mg (not administered)    Or  ondansetron (ZOFRAN) injection 4 mg (not administered)  acetaminophen (TYLENOL) tablet 650 mg (not administered)    Or  acetaminophen (TYLENOL) suppository 650 mg (not administered)  albuterol (PROVENTIL) (2.5 MG/3ML) 0.083% nebulizer solution 2.5 mg (not administered)     Initial Impression / Assessment and Plan / ED Course  I have reviewed the triage vital signs and the nursing notes.  Pertinent labs & imaging results that were available during my care of the patient were reviewed by me and considered in my medical decision making (see chart for details).     Labs and cardiac work up without source of pt's syncope. Pt was hypoxic with new oxygen requirement. Discussed case with Hospitalist who  would like to rule out PE. Dimer ordered. Pt will be admitted for continued work up.  Final Clinical Impressions(s) / ED Diagnoses   Final diagnoses:  Syncope and collapse  Hypoxia      Nira Conn, MD 03/24/16 [phone]

## 2016-03-23 NOTE — ED Triage Notes (Signed)
Pt reports to the ED for eval of syncopal episode last night. She was in the kitchen cooking when the episode occurred. Did not sustain any injuries and is not complaining of any pain. Has had intermittent HAs x 5-7 days. She was not seen or evaluated at time but today she is complaining of increasing generalized weakness. Pt was 88%-90% on RA so EMS placed her 2 L of O2 via nasal cannula and her sats improved to 95%. CBG 102 mg/dl. 12 lead unremarkable. Pt A&Ox4, resp e/u, and skin warm and dry.

## 2016-03-24 ENCOUNTER — Observation Stay (HOSPITAL_COMMUNITY): Payer: PPO

## 2016-03-24 ENCOUNTER — Observation Stay (HOSPITAL_BASED_OUTPATIENT_CLINIC_OR_DEPARTMENT_OTHER): Payer: PPO

## 2016-03-24 DIAGNOSIS — E039 Hypothyroidism, unspecified: Secondary | ICD-10-CM

## 2016-03-24 DIAGNOSIS — R55 Syncope and collapse: Secondary | ICD-10-CM

## 2016-03-24 DIAGNOSIS — I509 Heart failure, unspecified: Secondary | ICD-10-CM

## 2016-03-24 DIAGNOSIS — E7800 Pure hypercholesterolemia, unspecified: Secondary | ICD-10-CM | POA: Diagnosis not present

## 2016-03-24 DIAGNOSIS — I10 Essential (primary) hypertension: Secondary | ICD-10-CM | POA: Diagnosis not present

## 2016-03-24 DIAGNOSIS — K219 Gastro-esophageal reflux disease without esophagitis: Secondary | ICD-10-CM | POA: Diagnosis not present

## 2016-03-24 DIAGNOSIS — E860 Dehydration: Secondary | ICD-10-CM | POA: Diagnosis not present

## 2016-03-24 DIAGNOSIS — Y92000 Kitchen of unspecified non-institutional (private) residence as  the place of occurrence of the external cause: Secondary | ICD-10-CM | POA: Diagnosis not present

## 2016-03-24 DIAGNOSIS — G8929 Other chronic pain: Secondary | ICD-10-CM | POA: Diagnosis not present

## 2016-03-24 DIAGNOSIS — E032 Hypothyroidism due to medicaments and other exogenous substances: Secondary | ICD-10-CM | POA: Diagnosis not present

## 2016-03-24 DIAGNOSIS — I712 Thoracic aortic aneurysm, without rupture: Secondary | ICD-10-CM | POA: Diagnosis not present

## 2016-03-24 DIAGNOSIS — M797 Fibromyalgia: Secondary | ICD-10-CM | POA: Diagnosis not present

## 2016-03-24 DIAGNOSIS — I714 Abdominal aortic aneurysm, without rupture: Secondary | ICD-10-CM | POA: Diagnosis not present

## 2016-03-24 DIAGNOSIS — J208 Acute bronchitis due to other specified organisms: Secondary | ICD-10-CM | POA: Diagnosis not present

## 2016-03-24 DIAGNOSIS — T59811A Toxic effect of smoke, accidental (unintentional), initial encounter: Secondary | ICD-10-CM | POA: Diagnosis not present

## 2016-03-24 DIAGNOSIS — J9601 Acute respiratory failure with hypoxia: Secondary | ICD-10-CM

## 2016-03-24 DIAGNOSIS — I959 Hypotension, unspecified: Secondary | ICD-10-CM | POA: Diagnosis not present

## 2016-03-24 DIAGNOSIS — R05 Cough: Secondary | ICD-10-CM | POA: Diagnosis not present

## 2016-03-24 DIAGNOSIS — R0902 Hypoxemia: Secondary | ICD-10-CM | POA: Diagnosis not present

## 2016-03-24 DIAGNOSIS — N179 Acute kidney failure, unspecified: Principal | ICD-10-CM

## 2016-03-24 DIAGNOSIS — Z9884 Bariatric surgery status: Secondary | ICD-10-CM | POA: Diagnosis not present

## 2016-03-24 DIAGNOSIS — E785 Hyperlipidemia, unspecified: Secondary | ICD-10-CM | POA: Diagnosis not present

## 2016-03-24 DIAGNOSIS — J680 Bronchitis and pneumonitis due to chemicals, gases, fumes and vapors: Secondary | ICD-10-CM | POA: Diagnosis not present

## 2016-03-24 DIAGNOSIS — Z79899 Other long term (current) drug therapy: Secondary | ICD-10-CM | POA: Diagnosis not present

## 2016-03-24 DIAGNOSIS — M549 Dorsalgia, unspecified: Secondary | ICD-10-CM | POA: Diagnosis not present

## 2016-03-24 LAB — BASIC METABOLIC PANEL
Anion gap: 7 (ref 5–15)
BUN: 29 mg/dL — ABNORMAL HIGH (ref 6–20)
CO2: 30 mmol/L (ref 22–32)
Calcium: 8.8 mg/dL — ABNORMAL LOW (ref 8.9–10.3)
Chloride: 99 mmol/L — ABNORMAL LOW (ref 101–111)
Creatinine, Ser: 1.13 mg/dL — ABNORMAL HIGH (ref 0.44–1.00)
GFR calc Af Amer: 56 mL/min — ABNORMAL LOW (ref >60)
GFR calc non Af Amer: 48 mL/min — ABNORMAL LOW (ref >60)
Glucose, Bld: 103 mg/dL — ABNORMAL HIGH (ref 65–99)
Potassium: 5.5 mmol/L — ABNORMAL HIGH (ref 3.5–5.1)
Sodium: 136 mmol/L (ref 135–145)

## 2016-03-24 LAB — ECHOCARDIOGRAM COMPLETE
Height: 63 in
Weight: 4183.45 oz

## 2016-03-24 LAB — URINALYSIS, ROUTINE W REFLEX MICROSCOPIC
Bilirubin Urine: NEGATIVE
Glucose, UA: NEGATIVE mg/dL
Hgb urine dipstick: NEGATIVE
Ketones, ur: NEGATIVE mg/dL
Leukocytes, UA: NEGATIVE
Nitrite: NEGATIVE
Protein, ur: NEGATIVE mg/dL
Specific Gravity, Urine: 1.015 (ref 1.005–1.030)
pH: 5 (ref 5.0–8.0)

## 2016-03-24 LAB — CBC
HCT: 39 % (ref 36.0–46.0)
Hemoglobin: 12.2 g/dL (ref 12.0–15.0)
MCH: 28.8 pg (ref 26.0–34.0)
MCHC: 31.3 g/dL (ref 30.0–36.0)
MCV: 92 fL (ref 78.0–100.0)
Platelets: 227 10*3/uL (ref 150–400)
RBC: 4.24 MIL/uL (ref 3.87–5.11)
RDW: 16.9 % — ABNORMAL HIGH (ref 11.5–15.5)
WBC: 7.7 10*3/uL (ref 4.0–10.5)

## 2016-03-24 LAB — TSH: TSH: 3.785 u[IU]/mL (ref 0.350–4.500)

## 2016-03-24 LAB — TROPONIN I: Troponin I: <0.03 ng/mL (ref <0.03)

## 2016-03-24 LAB — D-DIMER, QUANTITATIVE: D-Dimer, Quant: 0.71 ug/mL-FEU — ABNORMAL HIGH (ref 0.00–0.50)

## 2016-03-24 MED ORDER — ACETAMINOPHEN 650 MG RE SUPP: 650.0000 mg | Freq: Four times a day (QID) | RECTAL | Status: DC | PRN

## 2016-03-24 MED ORDER — PREGABALIN 25 MG PO CAPS
100.0000 mg | ORAL_CAPSULE | Freq: Two times a day (BID) | ORAL | Status: DC
  Administered 2016-03-24 – 2016-03-26 (×5): 100 mg via ORAL
  Filled 2016-03-24 (×6): qty 4

## 2016-03-24 MED ORDER — ORAL CARE MOUTH RINSE: 15.0000 mL | Freq: Two times a day (BID) | OROMUCOSAL | Status: DC

## 2016-03-24 MED ORDER — PERFLUTREN LIPID MICROSPHERE
1.0000 mL | INTRAVENOUS | Status: AC | PRN
  Administered 2016-03-24: 3 mL via INTRAVENOUS
  Filled 2016-03-24: qty 10

## 2016-03-24 MED ORDER — TRAZODONE HCL 50 MG PO TABS
300.0000 mg | ORAL_TABLET | Freq: Every day | ORAL | Status: DC
Start: 2016-03-24 — End: 2016-03-26
  Administered 2016-03-24 – 2016-03-25 (×2): 300 mg via ORAL
  Filled 2016-03-24 (×2): qty 6

## 2016-03-24 MED ORDER — DULOXETINE HCL 60 MG PO CPEP
60.0000 mg | ORAL_CAPSULE | Freq: Every evening | ORAL | Status: DC
Start: 2016-03-24 — End: 2016-03-26
  Administered 2016-03-24 – 2016-03-25 (×2): 60 mg via ORAL
  Filled 2016-03-24 (×3): qty 1

## 2016-03-24 MED ORDER — SODIUM CHLORIDE 0.9 % IV BOLUS (SEPSIS)
500.0000 mL | Freq: Once | INTRAVENOUS | Status: AC
  Administered 2016-03-24: 500 mL via INTRAVENOUS

## 2016-03-24 MED ORDER — LISINOPRIL 10 MG PO TABS: 10.0000 mg | ORAL_TABLET | Freq: Every day | ORAL | Status: DC

## 2016-03-24 MED ORDER — IOPAMIDOL (ISOVUE-370) INJECTION 76%
INTRAVENOUS | Status: AC
Start: 2016-03-24 — End: 2016-03-24
  Administered 2016-03-24: 100 mL
  Filled 2016-03-24: qty 100

## 2016-03-24 MED ORDER — PANTOPRAZOLE SODIUM 40 MG PO TBEC
40.0000 mg | DELAYED_RELEASE_TABLET | Freq: Two times a day (BID) | ORAL | Status: DC
  Administered 2016-03-24 – 2016-03-26 (×5): 40 mg via ORAL
  Filled 2016-03-24 (×6): qty 1

## 2016-03-24 MED ORDER — PRAVASTATIN SODIUM 40 MG PO TABS
20.0000 mg | ORAL_TABLET | Freq: Every day | ORAL | Status: DC
Start: 2016-03-24 — End: 2016-03-26
  Administered 2016-03-24 – 2016-03-25 (×2): 20 mg via ORAL
  Filled 2016-03-24 (×3): qty 1

## 2016-03-24 MED ORDER — ONDANSETRON HCL 4 MG/2ML IJ SOLN: 4.0000 mg | Freq: Four times a day (QID) | INTRAMUSCULAR | Status: DC | PRN

## 2016-03-24 MED ORDER — ACETAMINOPHEN 325 MG PO TABS
650.0000 mg | ORAL_TABLET | Freq: Four times a day (QID) | ORAL | Status: DC | PRN
  Administered 2016-03-24 (×2): 650 mg via ORAL
  Filled 2016-03-24 (×2): qty 2

## 2016-03-24 MED ORDER — SODIUM CHLORIDE 0.9 % IV SOLN
INTRAVENOUS | Status: DC
Start: 2016-03-24 — End: 2016-03-26
  Administered 2016-03-24 – 2016-03-26 (×6): via INTRAVENOUS

## 2016-03-24 MED ORDER — DULOXETINE HCL 30 MG PO CPEP: 30.0000 mg | ORAL_CAPSULE | ORAL | Status: DC

## 2016-03-24 MED ORDER — DULOXETINE HCL 30 MG PO CPEP
30.0000 mg | ORAL_CAPSULE | Freq: Every day | ORAL | Status: DC
  Administered 2016-03-24 – 2016-03-26 (×3): 30 mg via ORAL
  Filled 2016-03-24 (×3): qty 1

## 2016-03-24 MED ORDER — ENOXAPARIN SODIUM 40 MG/0.4ML ~~LOC~~ SOLN
40.0000 mg | Freq: Every day | SUBCUTANEOUS | Status: DC
  Administered 2016-03-24 – 2016-03-26 (×3): 40 mg via SUBCUTANEOUS
  Filled 2016-03-24 (×3): qty 0.4

## 2016-03-24 MED ORDER — LEVOTHYROXINE SODIUM 75 MCG PO TABS
75.0000 ug | ORAL_TABLET | Freq: Every day | ORAL | Status: DC
  Administered 2016-03-24 – 2016-03-25 (×2): 75 ug via ORAL
  Filled 2016-03-24 (×2): qty 1

## 2016-03-24 MED ORDER — ONDANSETRON HCL 4 MG PO TABS: 4.0000 mg | ORAL_TABLET | Freq: Four times a day (QID) | ORAL | Status: DC | PRN

## 2016-03-24 MED ORDER — ALBUTEROL SULFATE (2.5 MG/3ML) 0.083% IN NEBU: 2.5000 mg | INHALATION_SOLUTION | RESPIRATORY_TRACT | Status: DC | PRN

## 2016-03-24 NOTE — ED Notes (Signed)
Delay in lab draw pt not in room. 

## 2016-03-24 NOTE — ED Notes (Addendum)
Pt.voided but miss the bedpan chux under the bed pan were soaked no urine collected at this time
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
## 2016-03-24 NOTE — Progress Notes (Signed)
  Echocardiogram 2D Echocardiogram with definity has been performed.  Leta JunglingCooper, Sam Overbeck M 03/24/2016, 12:35 PM
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
## 2016-03-25 ENCOUNTER — Inpatient Hospital Stay (HOSPITAL_COMMUNITY): Payer: PPO

## 2016-03-25 DIAGNOSIS — E032 Hypothyroidism due to medicaments and other exogenous substances: Secondary | ICD-10-CM

## 2016-03-25 LAB — CBC
HCT: 36.3 % (ref 36.0–46.0)
Hemoglobin: 11 g/dL — ABNORMAL LOW (ref 12.0–15.0)
MCH: 28.5 pg (ref 26.0–34.0)
MCHC: 30.3 g/dL (ref 30.0–36.0)
MCV: 94 fL (ref 78.0–100.0)
Platelets: 156 10*3/uL (ref 150–400)
RBC: 3.86 MIL/uL — ABNORMAL LOW (ref 3.87–5.11)
RDW: 16.7 % — ABNORMAL HIGH (ref 11.5–15.5)
WBC: 5.2 10*3/uL (ref 4.0–10.5)

## 2016-03-25 LAB — TROPONIN I: Troponin I: <0.03 ng/mL (ref <0.03)

## 2016-03-25 LAB — CORTISOL: Cortisol, Plasma: 10.7 ug/dL

## 2016-03-25 LAB — T4, FREE: Free T4: 0.84 ng/dL (ref 0.61–1.12)

## 2016-03-25 MED ORDER — SODIUM CHLORIDE 0.9 % IV BOLUS (SEPSIS)
500.0000 mL | Freq: Once | INTRAVENOUS | Status: AC
  Administered 2016-03-25: 500 mL via INTRAVENOUS

## 2016-03-25 MED ORDER — FLUDROCORTISONE ACETATE 0.1 MG PO TABS
0.1000 mg | ORAL_TABLET | Freq: Two times a day (BID) | ORAL | Status: DC
Start: 2016-03-25 — End: 2016-03-26
  Administered 2016-03-25 – 2016-03-26 (×3): 0.1 mg via ORAL
  Filled 2016-03-25 (×5): qty 1

## 2016-03-25 MED ORDER — LEVOTHYROXINE SODIUM 75 MCG PO TABS
75.0000 ug | ORAL_TABLET | ORAL | Status: DC
  Administered 2016-03-26: 75 ug via ORAL
  Filled 2016-03-25: qty 1

## 2016-03-25 MED ORDER — SODIUM POLYSTYRENE SULFONATE 15 GM/60ML PO SUSP
15.0000 g | Freq: Once | ORAL | Status: AC
  Administered 2016-03-25: 15 g via ORAL
  Filled 2016-03-25: qty 60
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
## 2016-03-26 ENCOUNTER — Ambulatory Visit (HOSPITAL_COMMUNITY): Payer: PPO

## 2016-03-26 DIAGNOSIS — R05 Cough: Secondary | ICD-10-CM

## 2016-03-26 LAB — CBC
HCT: 36.5 % (ref 36.0–46.0)
Hemoglobin: 11.1 g/dL — ABNORMAL LOW (ref 12.0–15.0)
MCH: 28.5 pg (ref 26.0–34.0)
MCHC: 30.4 g/dL (ref 30.0–36.0)
MCV: 93.8 fL (ref 78.0–100.0)
Platelets: 168 10*3/uL (ref 150–400)
RBC: 3.89 MIL/uL (ref 3.87–5.11)
RDW: 16.4 % — ABNORMAL HIGH (ref 11.5–15.5)
WBC: 5.5 10*3/uL (ref 4.0–10.5)

## 2016-03-26 LAB — COMPREHENSIVE METABOLIC PANEL
ALT: 10 U/L — ABNORMAL LOW (ref 14–54)
AST: 15 U/L (ref 15–41)
Albumin: 3 g/dL — ABNORMAL LOW (ref 3.5–5.0)
Alkaline Phosphatase: 64 U/L (ref 38–126)
Anion gap: 7 (ref 5–15)
BUN: 16 mg/dL (ref 6–20)
CO2: 28 mmol/L (ref 22–32)
Calcium: 8.3 mg/dL — ABNORMAL LOW (ref 8.9–10.3)
Chloride: 105 mmol/L (ref 101–111)
Creatinine, Ser: 0.83 mg/dL (ref 0.44–1.00)
GFR calc Af Amer: >60 mL/min (ref >60)
GFR calc non Af Amer: >60 mL/min (ref >60)
Glucose, Bld: 90 mg/dL (ref 65–99)
Potassium: 4 mmol/L (ref 3.5–5.1)
Sodium: 140 mmol/L (ref 135–145)
Total Bilirubin: 0.8 mg/dL (ref 0.3–1.2)
Total Protein: 5.2 g/dL — ABNORMAL LOW (ref 6.5–8.1)

## 2016-03-26 MED ORDER — LORATADINE 10 MG PO TABS
10.0000 mg | ORAL_TABLET | Freq: Every day | ORAL | Status: DC
  Administered 2016-03-26: 10 mg via ORAL
  Filled 2016-03-26 (×2): qty 1

## 2016-03-26 MED ORDER — AZITHROMYCIN 500 MG PO TABS
500.0000 mg | ORAL_TABLET | Freq: Every day | ORAL | Status: DC
  Administered 2016-03-26: 500 mg via ORAL
  Filled 2016-03-26: qty 1

## 2016-03-26 MED ORDER — FLUDROCORTISONE ACETATE 0.1 MG PO TABS: 0.1000 mg | ORAL_TABLET | Freq: Two times a day (BID) | ORAL | 0 refills | Status: AC

## 2016-03-26 MED ORDER — AZITHROMYCIN 500 MG PO TABS: 500.0000 mg | ORAL_TABLET | Freq: Every day | ORAL | 0 refills | Status: AC

## 2016-03-26 MED ORDER — LORATADINE 10 MG PO TABS: 10.0000 mg | ORAL_TABLET | Freq: Every day | ORAL | Status: DC

## 2016-03-26 NOTE — Care Management Note (Signed)
Case Management Note Donn PieriniKristi Leontae Bostock RN, BSN Unit 2W-Case Manager [phone]  Patient Details  Name: Lindsey DupesDeborah C Rowe MRN: [id number] Date of Birth: 11-11-1945  Subjective/Objective:  Pt admitted with syncope                   Action/Plan: PTA pt lived at home alone- orders placed for HHPT/OT/aide on discharge- spoke with pt at bedside- per pt she has cane and walker at home- plans to take a cab home- [facility]- per pt she has used [facility] in past and wants to use them again- (request Cornelius MorasOwen)- [facility] - spoke with Crystal- [facility]   Expected Discharge Date:    03/26/16              Expected Discharge Plan:  Home w Home Health Services  In-House Referral:  Clinical Social Work  Discharge planning Services  CM Consult  Post Acute Care Choice:  Home Health Choice offered to:  Patient  DME Arranged:  N/A DME Agency:  NA  HH Arranged:  PT, OT, Nurse's Aide HH [facility]  Status of Service:  Completed, signed off  If discussed at Long Length of Stay Meetings, dates discussed:    Discharge Disposition: home with home health  Additional Comments:  Darrold SpanWebster, Cyree Chuong Hall, RN 03/26/2016, 12:42 PM
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
## 2016-03-26 NOTE — Evaluation (Signed)
Physical Therapy Evaluation Patient Details Name: Lindsey Rowe MRN: [id number] DOB: Oct 27, 1945 Today's Date: 03/26/2016   History of Present Illness  71 y.o. female presenting with syncope, AKI with dehydration and acute respiratory failure with hypoxia. PMH of HTN, HLD, hypothyroidism and low back pain  Clinical Impression  Pt presents with decreased strength, balance and activity tolerance secondary to above. PTA pt was mod-I with use of SPC for household mobility and RW for community mobility. Pt now requires assist for balance with ambulation. Pt would benefit from d/c home with supervision for mobility and home health PT to address balance and strength impairments when medically ready. Will follow acutely.     Follow Up Recommendations Home health PT;Supervision for mobility/OOB    Equipment Recommendations  None recommended by PT (pt states she has RW and SPC at home)    Recommendations for Other Services       Precautions / Restrictions Precautions Precautions: Fall Restrictions Weight Bearing Restrictions: No      Mobility  Bed Mobility Overal bed mobility: Modified Independent             General bed mobility comments: increased time  Transfers Overall transfer level: Modified independent Equipment used: Straight cane             General transfer comment: sit to stand with increased time; pt reports she has been ambulating to in room restroom mod-I with use of SPC  Ambulation/Gait Ambulation/Gait assistance: Min assist Ambulation Distance (Feet): 100 Feet Assistive device: Straight cane Gait Pattern/deviations: Step-to pattern;Decreased step length - right;Decreased step length - left;Decreased stride length;Antalgic;Wide base of support Gait velocity: decreased Gait velocity interpretation: Below normal speed for age/gender General Gait Details: slow speed, excess lateral movement with ambulation secondary to compensation for back pain, physical  assist for balance, pt educated on use of RW to reduce likelihood of exacerbation of back pain due to excess trunk movement with antalgic gait  Stairs            Wheelchair Mobility    Modified Rankin (Stroke Patients Only)       Balance Overall balance assessment: Needs assistance Sitting-balance support: Feet supported;No upper extremity supported Sitting balance-Leahy Scale: Good Sitting balance - Comments: sat EOB x 5 min without LOB; able to scoot to EOB without assist but requires increased time   Standing balance support: Single extremity supported Standing balance-Leahy Scale: Fair Standing balance comment: not reliant on SPC for static standing balance, but relies upon SPC and physical assist to maintain balance with ambulation                             Pertinent Vitals/Pain Pain Assessment: 0-10 Pain Score: 6  Pain Location: low back Pain Descriptors / Indicators: Discomfort Pain Intervention(s): Limited activity within patient's tolerance;Repositioned    Home Living Family/patient expects to be discharged to:: Private residence Living Arrangements: Alone Available Help at Discharge: Friend(s);Available PRN/intermittently Type of Home: Apartment Home Access: Level entry     Home Layout: One level Home Equipment: Walker - 2 wheels;Cane - single point;Grab bars - tub/shower;Shower seat Additional Comments: pt reports she has friends that are able to help whenever needed and who were doing her grocery shopping for her PTA; pt states that she sometimes has difficulty lifting legs to get into shower due to low back pain and hip weakness    Prior Function Level of Independence: Independent with assistive device(s)  Comments: household mobility with SPC; community mobility typically with RW; was not driving or grocery shopping PTA; reports not getting out much     Hand Dominance   Dominant Hand: Right    Extremity/Trunk Assessment    Upper Extremity Assessment Upper Extremity Assessment: Generalized weakness    Lower Extremity Assessment Lower Extremity Assessment: Generalized weakness (weak hip flexors bilaterally)    Cervical / Trunk Assessment Cervical / Trunk Assessment: Kyphotic  Communication   Communication: No difficulties  Cognition Arousal/Alertness: Awake/alert Behavior During Therapy: WFL for tasks assessed/performed Overall Cognitive Status: Within Functional Limits for tasks assessed                 General Comments: A&O x 4    General Comments      Exercises     Assessment/Plan    PT Assessment Patient needs continued PT services  PT Problem List Decreased strength;Decreased activity tolerance;Decreased balance;Decreased mobility;Decreased knowledge of use of DME;Obesity;Pain          PT Treatment Interventions DME instruction;Gait training;Therapeutic activities;Therapeutic exercise;Balance training;Patient/family education    PT Goals (Current goals can be found in the Care Plan section)  Acute Rehab PT Goals Patient Stated Goal: I want to go home PT Goal Formulation: With patient Time For Goal Achievement: 04/09/16 Potential to Achieve Goals: Good    Frequency Min 3X/week   Barriers to discharge Decreased caregiver support lives alone with only intermittent assist available    Co-evaluation               End of Session Equipment Utilized During Treatment: Gait belt Activity Tolerance: Patient limited by pain Patient left: in bed;with call bell/phone within reach Nurse Communication: Mobility status         Time: 1610-9604 PT Time Calculation (min) (ACUTE ONLY): 28 min   Charges:   PT Evaluation $PT Eval Low Complexity: 1 Procedure PT Treatments $Gait Training: 8-22 mins   PT G CodesLane Hacker Apr 16, 2016, 11:09 AM   Lane Hacker, SPT Acute Rehab SPT [phone]

## 2016-03-26 NOTE — Progress Notes (Signed)
Pt will be discharged home via taxi. Discharge instructions given and all questions answered.

## 2016-03-26 NOTE — Progress Notes (Signed)
OT Cancellation Note  Patient Details Name: Lindsey Rowe MRN: [id number] DOB: [birth year]   Cancelled Treatment:    Reason Eval/Treat Not Completed: Fatigue/lethargy limiting ability to participate;Other (comment). Pt refused OT eval stating "go away, I don't feel like working with you cause I'm tired and haven't slept well". Will check back later today as able  Galen ManilaSpencer, Daralyn Bert Jeanette 03/26/2016, 11:30 AM

## 2016-03-28 LAB — ALDOSTERONE: Aldosterone: 3.3 ng/dL (ref 0.0–30.0)

## 2016-04-02 DIAGNOSIS — I712 Thoracic aortic aneurysm, without rupture: Secondary | ICD-10-CM | POA: Diagnosis not present

## 2016-04-02 DIAGNOSIS — M797 Fibromyalgia: Secondary | ICD-10-CM | POA: Diagnosis not present

## 2016-04-02 DIAGNOSIS — E7800 Pure hypercholesterolemia, unspecified: Secondary | ICD-10-CM | POA: Diagnosis not present

## 2016-04-02 DIAGNOSIS — F319 Bipolar disorder, unspecified: Secondary | ICD-10-CM | POA: Diagnosis not present

## 2016-04-02 DIAGNOSIS — N183 Chronic kidney disease, stage 3 (moderate): Secondary | ICD-10-CM | POA: Diagnosis not present

## 2016-04-02 DIAGNOSIS — R0902 Hypoxemia: Secondary | ICD-10-CM | POA: Diagnosis not present

## 2016-04-02 DIAGNOSIS — I129 Hypertensive chronic kidney disease with stage 1 through stage 4 chronic kidney disease, or unspecified chronic kidney disease: Secondary | ICD-10-CM | POA: Diagnosis not present

## 2016-04-02 DIAGNOSIS — J45909 Unspecified asthma, uncomplicated: Secondary | ICD-10-CM | POA: Diagnosis not present

## 2016-04-02 DIAGNOSIS — R55 Syncope and collapse: Secondary | ICD-10-CM | POA: Diagnosis not present

## 2016-04-02 DIAGNOSIS — E039 Hypothyroidism, unspecified: Secondary | ICD-10-CM | POA: Diagnosis not present

## 2016-04-03 ENCOUNTER — Ambulatory Visit: Payer: PPO | Admitting: Registered"

## 2016-04-06 DIAGNOSIS — J45909 Unspecified asthma, uncomplicated: Secondary | ICD-10-CM | POA: Diagnosis not present

## 2016-04-06 DIAGNOSIS — R55 Syncope and collapse: Secondary | ICD-10-CM | POA: Diagnosis not present

## 2016-04-06 DIAGNOSIS — I129 Hypertensive chronic kidney disease with stage 1 through stage 4 chronic kidney disease, or unspecified chronic kidney disease: Secondary | ICD-10-CM | POA: Diagnosis not present

## 2016-04-06 DIAGNOSIS — R0902 Hypoxemia: Secondary | ICD-10-CM | POA: Diagnosis not present

## 2016-04-10 ENCOUNTER — Emergency Department (HOSPITAL_BASED_OUTPATIENT_CLINIC_OR_DEPARTMENT_OTHER): Admit: 2016-04-10 | Discharge: 2016-04-10 | Disposition: A | Discharge status: Home / Self Care | Payer: PPO

## 2016-04-10 ENCOUNTER — Emergency Department (HOSPITAL_COMMUNITY): Payer: PPO

## 2016-04-10 ENCOUNTER — Encounter (HOSPITAL_COMMUNITY): Payer: Self-pay

## 2016-04-10 ENCOUNTER — Emergency Department (HOSPITAL_COMMUNITY)
Admission: EM | Admit: 2016-04-10 | Discharge: 2016-04-10 | Disposition: A | Discharge status: Home / Self Care | Payer: PPO | Attending: Emergency Medicine | Admitting: Emergency Medicine

## 2016-04-10 DIAGNOSIS — S299XXA Unspecified injury of thorax, initial encounter: Secondary | ICD-10-CM | POA: Diagnosis not present

## 2016-04-10 DIAGNOSIS — I10 Essential (primary) hypertension: Secondary | ICD-10-CM | POA: Diagnosis not present

## 2016-04-10 DIAGNOSIS — M25571 Pain in right ankle and joints of right foot: Secondary | ICD-10-CM

## 2016-04-10 DIAGNOSIS — E039 Hypothyroidism, unspecified: Secondary | ICD-10-CM | POA: Insufficient documentation

## 2016-04-10 DIAGNOSIS — Y92009 Unspecified place in unspecified non-institutional (private) residence as the place of occurrence of the external cause: Secondary | ICD-10-CM | POA: Diagnosis not present

## 2016-04-10 DIAGNOSIS — M7989 Other specified soft tissue disorders: Secondary | ICD-10-CM

## 2016-04-10 DIAGNOSIS — Y999 Unspecified external cause status: Secondary | ICD-10-CM | POA: Diagnosis not present

## 2016-04-10 DIAGNOSIS — R2241 Localized swelling, mass and lump, right lower limb: Secondary | ICD-10-CM | POA: Diagnosis not present

## 2016-04-10 DIAGNOSIS — W19XXXA Unspecified fall, initial encounter: Secondary | ICD-10-CM

## 2016-04-10 DIAGNOSIS — W1839XA Other fall on same level, initial encounter: Secondary | ICD-10-CM | POA: Diagnosis not present

## 2016-04-10 DIAGNOSIS — R52 Pain, unspecified: Secondary | ICD-10-CM | POA: Diagnosis not present

## 2016-04-10 DIAGNOSIS — Y9389 Activity, other specified: Secondary | ICD-10-CM | POA: Diagnosis not present

## 2016-04-10 DIAGNOSIS — M79609 Pain in unspecified limb: Secondary | ICD-10-CM | POA: Diagnosis not present

## 2016-04-10 DIAGNOSIS — M79671 Pain in right foot: Secondary | ICD-10-CM | POA: Diagnosis not present

## 2016-04-10 DIAGNOSIS — R079 Chest pain, unspecified: Secondary | ICD-10-CM | POA: Diagnosis not present

## 2016-04-10 LAB — CBC WITH DIFFERENTIAL/PLATELET
Basophils Absolute: 0 10*3/uL (ref 0.0–0.1)
Basophils Relative: 0 %
Eosinophils Absolute: 0 10*3/uL (ref 0.0–0.7)
Eosinophils Relative: 0 %
HCT: 40.3 % (ref 36.0–46.0)
Hemoglobin: 12.5 g/dL (ref 12.0–15.0)
Lymphocytes Relative: 14 %
Lymphs Abs: 1.4 10*3/uL (ref 0.7–4.0)
MCH: 28.7 pg (ref 26.0–34.0)
MCHC: 31 g/dL (ref 30.0–36.0)
MCV: 92.6 fL (ref 78.0–100.0)
Monocytes Absolute: 1.5 10*3/uL — ABNORMAL HIGH (ref 0.1–1.0)
Monocytes Relative: 15 %
Neutro Abs: 7.1 10*3/uL (ref 1.7–7.7)
Neutrophils Relative %: 71 %
Platelets: 202 10*3/uL (ref 150–400)
RBC: 4.35 MIL/uL (ref 3.87–5.11)
RDW: 15.4 % (ref 11.5–15.5)
WBC: 10.1 10*3/uL (ref 4.0–10.5)

## 2016-04-10 LAB — BASIC METABOLIC PANEL
Anion gap: 10 (ref 5–15)
BUN: 13 mg/dL (ref 6–20)
CO2: 38 mmol/L — ABNORMAL HIGH (ref 22–32)
Calcium: 9.3 mg/dL (ref 8.9–10.3)
Chloride: 91 mmol/L — ABNORMAL LOW (ref 101–111)
Creatinine, Ser: 0.99 mg/dL (ref 0.44–1.00)
GFR calc Af Amer: >60 mL/min (ref >60)
GFR calc non Af Amer: 57 mL/min — ABNORMAL LOW (ref >60)
Glucose, Bld: 103 mg/dL — ABNORMAL HIGH (ref 65–99)
Potassium: 3.1 mmol/L — ABNORMAL LOW (ref 3.5–5.1)
Sodium: 139 mmol/L (ref 135–145)

## 2016-04-10 MED ORDER — ACETAMINOPHEN 500 MG PO TABS
1000.0000 mg | ORAL_TABLET | Freq: Once | ORAL | Status: DC
  Filled 2016-04-10: qty 2

## 2016-04-10 NOTE — Progress Notes (Signed)
*  PRELIMINARY RESULTS* Vascular Ultrasound Right lower extremity venous duplex has been completed.  Preliminary findings: No evidence of deep vein thrombosis or baker's cyst in the right lower extremity.  Attempted to deliver preliminary report to Dr. Jacqulyn BathLong, unable to locate- preliminary report given to charge nurse and nurse tech at beside 19:40.   Chauncey FischerCharlotte C Zyon Grout 04/10/2016, 7:42 PM

## 2016-04-10 NOTE — ED Provider Notes (Signed)
Emergency Department Provider Note   I have reviewed the triage vital signs and the nursing notes.   HISTORY  Chief Complaint Foot Pain (R)   HPI Lindsey Rowe is a 71 y.o. female with PMH of bipolar disorder, GERD, HLD, and HTN presents to the emergency department for evaluation of right/ankle pain, redness, and swelling. Patient states his symptoms began after a mechanical fall at home. She is reaching for a shower curtain which broke and caused her to fall over. She denies any head injury or loss of consciousness. She fell on her right side is been having pain in the right ankle for the past several days. She saw her primary care physician who referred her to the emergency department for evaluation of DVT. Patient has been ambulatory at home but has had some increased difficulty. She lives at home alone but has friends nearby that she can call for help as needed. She reports some abdominal bruising and mild left upper quadrant pain. Denies chest pain or difficulty breathing. The pain in her ankle is moderate, constant, worse with movement. No associated numbness/tingling.    Past Medical History:  Diagnosis Date  . Arthritis   . Back pain   . Bipolar 1 disorder (HCC)   . Complication of anesthesia    hard to wake up anesthesia  . Depression   . GERD (gastroesophageal reflux disease)   . Headache(784.0)    migraines and tension headaches  . Hyperlipidemia   . Hypertension   . Hypothyroidism   . Thyroid disease     Patient Active Problem List   Diagnosis Date Noted  . Syncope 03/24/2016  . Generalized weakness 09/24/2015  . Leg pain, bilateral 09/24/2015  . Depression 09/24/2015  . Loss of appetite 09/24/2015  . Leg weakness, bilateral   . Hypothyroid 12/28/2012  . Hypovolemia 12/28/2012  . Hypotension 08/13/2012  . Dehydration 08/13/2012  . AKI (acute kidney injury) (HCC) 08/13/2012  . Hypoventilation associated with obesity syndrome (HCC) 08/13/2012  . Acute  respiratory failure (HCC) 07/26/2012  . Chest pain 07/26/2012  . ANKLE SPRAIN, RIGHT 03/16/2010  . DIVERTICULOSIS, COLON 10/31/2009  . SKIN LESION 09/23/2009  . Shoulder pain, right 07/05/2009  . COUGH 03/29/2009  . Migraine 03/11/2009  . OTITIS MEDIA, RIGHT 12/13/2008  . BRUISE 12/06/2008  . ACID REFLUX DISEASE 09/13/2008  . ALLERGIC RHINITIS 05/20/2008  . UNSPECIFIED DISEASE OF HAIR AND HAIR FOLLICLES 05/20/2008  . DERMATITIS 11/12/2007  . OBESITY 07/18/2007  . HYPERCHOLESTEROLEMIA 05/13/2007  . BIPOLAR AFFECTIVE DISORDER 05/12/2007  . HYPERTENSION, BENIGN ESSENTIAL 05/12/2007  . Low back pain potentially associated with radiculopathy 05/12/2007    Past Surgical History:  Procedure Laterality Date  . APPENDECTOMY    . BACK SURGERY    . EYE SURGERY Bilateral    lens implant  . GASTRIC BYPASS    . LUMBAR LAMINECTOMY/DECOMPRESSION MICRODISCECTOMY Right 07/23/2012   Procedure: LUMBAR LAMINECTOMY/DECOMPRESSION MICRODISCECTOMY 2 LEVELS;  Surgeon: Karn CassisErnesto M Botero, MD;  Location: [facility];  Service: Neurosurgery;  Laterality: Right;  Right Lumbar three-four  lumbar four-five Laminectomy/Foraminotomy  . NASAL SINUS SURGERY Bilateral   . TONSILLECTOMY    . WRIST SURGERY      Current Outpatient Rx  . Order #: [id number] Class: Historical Med  . Order #: [id number] Class: Historical Med  . Order #: [id number] Class: Historical Med  . Order #: [id number] Class: Historical Med  . Order #: [id number] Class: Historical Med  . Order #: [id number] Class: Historical Med  . Order #: [id number] Class: Historical  Med  . Order #: [id number] Class: Historical Med  . Order #: [id number] Class: Historical Med  . Order #: [id number] Class: No Print  . Order #: [id number] Class: Historical Med  . Order #: [id number] Class: Historical Med  . Order #: [id number] Class: Historical Med  . Order #: [id number] Class: Normal    Allergies Gabapentin and Cefadroxil  History reviewed. No pertinent family history.  Social  History Social History  Substance Use Topics  . Smoking status: Never Smoker  . Smokeless tobacco: Never Used  . Alcohol use Yes     Comment: social    Review of Systems  Constitutional: No fever/chills Eyes: No visual changes. ENT: No sore throat. Cardiovascular: Denies chest pain. Respiratory: Denies shortness of breath. Gastrointestinal: No abdominal pain.  No nausea, no vomiting.  No diarrhea.  No constipation. Genitourinary: Negative for dysuria. Musculoskeletal: Negative for back pain. Positive right foot/ankle swelling and pain. Positive left sided rib pain.  Skin: Negative for rash. Neurological: Negative for headaches, focal weakness or numbness.  10-point ROS otherwise negative.  ____________________________________________   PHYSICAL EXAM:  VITAL SIGNS: ED Triage Vitals  Enc Vitals Group     BP 04/10/16 1429 160/82     Pulse Rate 04/10/16 1429 87     Resp 04/10/16 1429 18     Temp 04/10/16 1429 98.2 F (36.8 C)     Temp Source 04/10/16 1429 Oral     SpO2 04/10/16 1429 (!) 86 %     Pain Score 04/10/16 1427 10   Constitutional: Alert and oriented. Well appearing and in no acute distress. Eyes: Conjunctivae are normal. Head: Atraumatic. Nose: No congestion/rhinnorhea. Nasal cannula O2 in place  Mouth/Throat: Mucous membranes are moist.   Neck: No stridor.   Cardiovascular: Normal rate, regular rhythm. Good peripheral circulation. Grossly normal heart sounds.   Respiratory: Normal respiratory effort.  No retractions. Lungs CTAB. Gastrointestinal: Soft and nontender. No distention.  Musculoskeletal: Right ankle tenderness over the medial malleolus. Some associated swelling and erythema extending to the lower calf. No cellulitis changes. No knee or proximal fibular tenderness.  Neurologic:  Normal speech and language. No gross focal neurologic deficits are appreciated.  Skin:  Skin is warm, dry and intact. No rash noted. Psychiatric: Mood and affect are  normal. Speech and behavior are normal.  ____________________________________________   LABS (all labs ordered are listed, but only abnormal results are displayed)  Labs Reviewed  CBC WITH DIFFERENTIAL/PLATELET - Abnormal; Notable for the following:       Result Value   Monocytes Absolute 1.5 (*)    All other components within normal limits  BASIC METABOLIC PANEL - Abnormal; Notable for the following:    Potassium 3.1 (*)    Chloride 91 (*)    CO2 38 (*)    Glucose, Bld 103 (*)    GFR calc non Af Amer 57 (*)    All other components within normal limits   ____________________________________________  RADIOLOGY  Dg Chest 2 View  Result Date: 04/10/2016 CLINICAL DATA:  Left chest wall pain, status post fall in the bathroom 4 days ago EXAM: CHEST  2 VIEW COMPARISON:  03/25/2016 FINDINGS: Borderline cardiomegaly. Study is limited by poor inspiration. Central mild vascular congestion without convincing pulmonary edema. Mild right basilar atelectasis. No segmental infiltrate. Elevation of the right hemidiaphragm again noted. IMPRESSION: Study is limited by poor inspiration. Central mild vascular congestion without convincing pulmonary edema. Mild right basilar atelectasis. No segmental infiltrate. Elevation of the right hemidiaphragm again noted. Electronically Signed   By:  Natasha Mead M.D.   On: 04/10/2016 20:25   Dg Ankle Complete Right  Result Date: 04/10/2016 CLINICAL DATA:  Right ankle pain and swelling after falling in the bathroom 4 days ago. Initial encounter. EXAM: RIGHT ANKLE - COMPLETE 3+ VIEW COMPARISON:  Right foot radiographs 08/18/2012 and right ankle radiographs 02/12/2011 FINDINGS: There is no evidence of acute fracture or dislocation. There is mild spurring at the tip of the lateral malleolus with increased spurring or a small well corticated ossicle at the adjacent aspect of the talus. Posterior and plantar calcaneal spurring no do not appear significantly changed. There is  mild diffuse soft tissue swelling about the ankle. IMPRESSION: Soft tissue swelling without acute osseous abnormality identified. Electronically Signed   By: Sebastian Ache M.D.   On: 04/10/2016 20:28    ____________________________________________   PROCEDURES  Procedure(s) performed:   Procedures  None ____________________________________________   INITIAL IMPRESSION / ASSESSMENT AND PLAN / ED COURSE  Pertinent labs & imaging results that were available during my care of the patient were reviewed by me and considered in my medical decision making (see chart for details).  Patient resents to the emergency department for evaluation after a mechanical fall with resulting right ankle pain. She has swelling, redness, and tenderness to palpation over the ankle. No calf tenderness. Her PCP was concerned for DVT by report. No fever/chills. Normal ROM of the knee and hip. Plan for RLE [place] and plain films of the left ankle and CXR with some left rib tenderness to palpation.   On re-evaluation the patient has a normal DVT study and normal x-rays of the right ankle and chest. Provided air cast for likely sprained ankle in the setting of trauma. The patient is ambulatory with a walker and feels safe at home. She is followed closely by her PCP and will review the ED visit with them. Patient newly started on home O2. No respiratory distress. Plan for discharge with [facility]. Return precautions discussed in detail. Patient is pleased at discharge.   11:35 PM Called to discuss Mirah Nevins wait time with patient. [facility] called prior to check on Bryne Lindon wait time. Offered a meal or drink to make her more comfortable while she waits. Apologized for Marbin Olshefski wait. During conversation [facility] arrives.   At this time, I do not feel there is any life-threatening condition present. I have reviewed and discussed all results (EKG, imaging, lab, urine as appropriate), exam findings with patient. I have reviewed nursing notes and  appropriate previous records.  I feel the patient is safe to be discharged home without further emergent workup. Discussed usual and customary return precautions. Patient and family (if present) verbalize understanding and are comfortable with this plan.  Patient will follow-up with their primary care provider. If they do not have a primary care provider, information for follow-up has been provided to them. All questions have been answered.  ____________________________________________  FINAL CLINICAL IMPRESSION(S) / ED DIAGNOSES  Final diagnoses:  Acute right ankle pain  Right leg swelling     MEDICATIONS GIVEN DURING THIS VISIT:  None  NEW OUTPATIENT MEDICATIONS STARTED DURING THIS VISIT:  None   Note:  This document was prepared using Dragon voice recognition software and may include unintentional dictation errors.  Alona Bene, MD Emergency Medicine   Maia Plan, MD 04/11/16 [phone]

## 2016-04-10 NOTE — ED Notes (Signed)
ED Provider at bedside. 

## 2016-04-10 NOTE — Discharge Instructions (Signed)
We believe that your symptoms are caused by musculoskeletal strain.  Please read through the included information about additional care such as heating pads, over-the-counter pain medicine.  If you were provided a prescription please use it only as needed and as instructed.  Remember that early mobility and using the affected part of your body is actually better than keeping it immobile.  We provided an air cast to use for your comfort. Remove it as you see fit.   Follow-up with the doctor listed as recommended or return to the emergency department with new or worsening symptoms that concern you.

## 2016-04-10 NOTE — ED Notes (Signed)
[facility] called for transport.  

## 2016-04-10 NOTE — ED Notes (Signed)
Pt c/o that [facility] is taking to long to get her home; delay explained to pt and pt reassured that she had not been forgotten about

## 2016-04-10 NOTE — ED Notes (Signed)
Blood draw delayed due to vascular ultrasound.

## 2016-04-10 NOTE — ED Triage Notes (Signed)
Pt presents from home c/o swelling and pain in R foot starting Friday. 10/10 pain. Her PCP recommended that she be sent here to evaluate whether or not she has a clot. Pt states that she thinks its fibromyalgia bc she has a hx of same. Pt is on 2L at home. A&Ox4. Not ambulatory,

## 2016-04-10 NOTE — ED Notes (Signed)
Bed: [facility] Expected date:  Expected time:  Means of arrival:  Comments: 

## 2016-04-11 DIAGNOSIS — M79609 Pain in unspecified limb: Secondary | ICD-10-CM | POA: Diagnosis not present

## 2016-04-11 DIAGNOSIS — R531 Weakness: Secondary | ICD-10-CM | POA: Diagnosis not present

## 2016-04-16 ENCOUNTER — Ambulatory Visit: Payer: PPO | Admitting: Registered"

## 2016-04-18 ENCOUNTER — Encounter: Payer: PPO | Admitting: Surgery

## 2016-04-21 DIAGNOSIS — J45909 Unspecified asthma, uncomplicated: Secondary | ICD-10-CM | POA: Diagnosis not present

## 2016-04-21 DIAGNOSIS — M797 Fibromyalgia: Secondary | ICD-10-CM | POA: Diagnosis not present

## 2016-04-21 DIAGNOSIS — G43909 Migraine, unspecified, not intractable, without status migrainosus: Secondary | ICD-10-CM | POA: Diagnosis not present

## 2016-04-21 DIAGNOSIS — Z9981 Dependence on supplemental oxygen: Secondary | ICD-10-CM | POA: Diagnosis not present

## 2016-04-21 DIAGNOSIS — I129 Hypertensive chronic kidney disease with stage 1 through stage 4 chronic kidney disease, or unspecified chronic kidney disease: Secondary | ICD-10-CM | POA: Diagnosis not present

## 2016-04-21 DIAGNOSIS — Z9181 History of falling: Secondary | ICD-10-CM | POA: Diagnosis not present

## 2016-04-21 DIAGNOSIS — E7800 Pure hypercholesterolemia, unspecified: Secondary | ICD-10-CM | POA: Diagnosis not present

## 2016-04-21 DIAGNOSIS — E039 Hypothyroidism, unspecified: Secondary | ICD-10-CM | POA: Diagnosis not present

## 2016-04-21 DIAGNOSIS — M48061 Spinal stenosis, lumbar region without neurogenic claudication: Secondary | ICD-10-CM | POA: Diagnosis not present

## 2016-04-21 DIAGNOSIS — N183 Chronic kidney disease, stage 3 (moderate): Secondary | ICD-10-CM | POA: Diagnosis not present

## 2016-04-21 DIAGNOSIS — F319 Bipolar disorder, unspecified: Secondary | ICD-10-CM | POA: Diagnosis not present

## 2016-04-24 DIAGNOSIS — J45909 Unspecified asthma, uncomplicated: Secondary | ICD-10-CM | POA: Diagnosis not present

## 2016-04-24 DIAGNOSIS — M48061 Spinal stenosis, lumbar region without neurogenic claudication: Secondary | ICD-10-CM | POA: Diagnosis not present

## 2016-04-24 DIAGNOSIS — F319 Bipolar disorder, unspecified: Secondary | ICD-10-CM | POA: Diagnosis not present

## 2016-04-24 DIAGNOSIS — Z9181 History of falling: Secondary | ICD-10-CM | POA: Diagnosis not present

## 2016-04-24 DIAGNOSIS — E039 Hypothyroidism, unspecified: Secondary | ICD-10-CM | POA: Diagnosis not present

## 2016-04-24 DIAGNOSIS — Z9981 Dependence on supplemental oxygen: Secondary | ICD-10-CM | POA: Diagnosis not present

## 2016-04-24 DIAGNOSIS — N183 Chronic kidney disease, stage 3 (moderate): Secondary | ICD-10-CM | POA: Diagnosis not present

## 2016-04-24 DIAGNOSIS — E7800 Pure hypercholesterolemia, unspecified: Secondary | ICD-10-CM | POA: Diagnosis not present

## 2016-04-24 DIAGNOSIS — G43909 Migraine, unspecified, not intractable, without status migrainosus: Secondary | ICD-10-CM | POA: Diagnosis not present

## 2016-04-24 DIAGNOSIS — I129 Hypertensive chronic kidney disease with stage 1 through stage 4 chronic kidney disease, or unspecified chronic kidney disease: Secondary | ICD-10-CM | POA: Diagnosis not present

## 2016-04-24 DIAGNOSIS — M797 Fibromyalgia: Secondary | ICD-10-CM | POA: Diagnosis not present

## 2016-04-25 ENCOUNTER — Encounter: Payer: Self-pay | Admitting: Surgery

## 2016-04-25 ENCOUNTER — Institutional Professional Consult (permissible substitution) (INDEPENDENT_AMBULATORY_CARE_PROVIDER_SITE_OTHER): Payer: PPO | Admitting: Surgery

## 2016-04-25 VITALS — BP 125/82 | HR 90 | Resp 20 | Ht 63.0 in | Wt 270.0 lb

## 2016-04-25 DIAGNOSIS — I7121 Aneurysm of the ascending aorta, without rupture: Secondary | ICD-10-CM

## 2016-04-25 DIAGNOSIS — I712 Thoracic aortic aneurysm, without rupture: Secondary | ICD-10-CM

## 2016-04-26 ENCOUNTER — Encounter: Payer: Self-pay | Admitting: Surgery

## 2016-04-26 NOTE — Progress Notes (Signed)
Cardiothoracic Surgery Consultation   PCP is Astrid Divine, MD Referring Provider is Maurice Small, MD  Chief Complaint  Patient presents with  . Thoracic Aortic Aneurysm    Surgical eval, CTA Chest 03/24/2016, ECHO 03/24/16    HPI:  The patient is a 71 year old woman with hypertension, hyperlipidemia, hypothyroidism, morbid obesity, and bipolar disorder who is on home oxygen for resting hypoxemia. She was admitted last month with syncope at home and as part of her workup she had a chest CT ruling out PE but it did show a fusiform 4.2 cm ascending aortic aneurysm. An echo showed a trileaflet aortic valve with no stenosis or insufficiency. LVEF was 60-65%. She had grade 2 diastolic dysfunction.   Past Medical History:  Diagnosis Date  . Arthritis   . Back pain   . Bipolar 1 disorder (HCC)   . Complication of anesthesia    hard to wake up anesthesia  . Depression   . GERD (gastroesophageal reflux disease)   . Headache(784.0)    migraines and tension headaches  . Hyperlipidemia   . Hypertension   . Hypothyroidism   . Thyroid disease     Past Surgical History:  Procedure Laterality Date  . APPENDECTOMY    . BACK SURGERY    . EYE SURGERY Bilateral    lens implant  . GASTRIC BYPASS    . LUMBAR LAMINECTOMY/DECOMPRESSION MICRODISCECTOMY Right 07/23/2012   Procedure: LUMBAR LAMINECTOMY/DECOMPRESSION MICRODISCECTOMY 2 LEVELS;  Surgeon: Karn Cassis, MD;  Location: [facility];  Service: Neurosurgery;  Laterality: Right;  Right Lumbar three-four  lumbar four-five Laminectomy/Foraminotomy  . NASAL SINUS SURGERY Bilateral   . TONSILLECTOMY    . WRIST SURGERY      Family history  There is no family history of aneuryms or aortic dissection.   Social History Social History  Substance Use Topics  . Smoking status: Never Smoker  . Smokeless tobacco: Never Used  . Alcohol use Yes     Comment: social    Current Outpatient Prescriptions  Medication Sig  Dispense Refill  . Aspirin-Salicylamide-Caffeine (BC HEADACHE POWDER PO) Take 1 packet by mouth as needed (headache, pain).    . Cholecalciferol (D3-1000) 1000 units capsule Take 1,000 Units by mouth daily.    . DULoxetine (CYMBALTA) 30 MG capsule Take 30 mg by mouth every morning.     . DULoxetine (CYMBALTA) 60 MG capsule Take 60 mg by mouth every evening.     [name] levothyroxine (SYNTHROID, LEVOTHROID) 75 MCG tablet Take 75 mcg by mouth daily before breakfast.     . lisinopril (PRINIVIL,ZESTRIL) 10 MG tablet Take 10 mg by mouth daily.    . meloxicam (MOBIC) 15 MG tablet Take 15 mg by mouth daily.     [name] omeprazole (PRILOSEC) 20 MG capsule Take 20 mg by mouth 2 (two) times daily before a meal.    . pravastatin (PRAVACHOL) 20 MG tablet Take 20 mg by mouth at bedtime.     . pregabalin (LYRICA) 100 MG capsule Take 1 capsule (100 mg total) by mouth 2 (two) times daily.    . SUMAtriptan (IMITREX) 100 MG tablet Take 100 mg by mouth as needed for headache. Take 100mg  at onset of headache, may repeat in 2 hours if needed. Do not exceed 2 doses in 24 hours.    . traZODone (DESYREL) 100 MG tablet Take 300 mg by mouth at bedtime.     . vitamin B-12 (CYANOCOBALAMIN) 1000 MCG tablet Take 1,000 mcg by  mouth daily.     No current facility-administered medications for this visit.     Allergies  Allergen Reactions  . Gabapentin Other (See Comments)    Right foot and leg swelled   . Cefadroxil Itching    Ends of hair itched     Review of Systems  Constitutional: Positive for activity change, appetite change and fatigue.  HENT: Negative.   Eyes: Negative.   Respiratory: Positive for shortness of breath.   Cardiovascular: Negative for chest pain.  Musculoskeletal: Positive for back pain.  Neurological: Positive for dizziness and syncope.  Psychiatric/Behavioral:       Bipolar disorder    BP 125/82   Pulse 90   Resp 20   Ht 5\' 3"  (1.6 m)   Wt 270 lb (122.5 kg)   SpO2 97% Comment: 3L O2 per [place]  BMI  47.83 kg/m  Physical Exam  Constitutional: She is oriented to person, place, and time.  Obese woman walking with walker and oxygen in no distress.  HENT:  Head: Normocephalic and atraumatic.  Mouth/Throat: Oropharynx is clear and moist.  Eyes: EOM are normal. Pupils are equal, round, and reactive to light.  Neck: Normal range of motion. Neck supple.  Cardiovascular: Normal rate, regular rhythm and normal heart sounds.   No murmur heard. Pulmonary/Chest: Effort normal. No respiratory distress. She has no wheezes. She has no rales.  Abdominal: Soft. Bowel sounds are normal.  obese  Musculoskeletal: She exhibits no edema.  Lymphadenopathy:    She has cervical adenopathy.  Neurological: She is alert and oriented to person, place, and time. She has normal strength. No cranial nerve deficit or sensory deficit.  Skin: Skin is warm and dry.  Psychiatric: She has a normal mood and affect.    Diagnostic Tests:  CLINICAL DATA:  Initial evaluation for acute hypoxia.  EXAM: CT ANGIOGRAPHY CHEST WITH CONTRAST  TECHNIQUE: Multidetector CT imaging of the chest was performed using the standard protocol during bolus administration of intravenous contrast. Multiplanar CT image reconstructions and MIPs were obtained to evaluate the vascular anatomy.  CONTRAST:  100 cc of Isovue 370.  COMPARISON:  Prior radiograph from earlier the same day.  FINDINGS: Cardiovascular: Ascending aorta dilated up to 4.2 cm. Minimal plaque noted within the aorta itself. Partially visualized great vessels within normal limits. Heart size normal. No pericardial effusion.  Pulmonary arterial tree adequately opacified for evaluation. No filling defect to suggest acute pulmonary embolism. Re-formatted imaging confirms these findings.  Mediastinum/Nodes: No pathologically enlarged mediastinal, hilar, or axillary lymph nodes identified. Esophagus within normal limits.  Lungs/Pleura: Minimal atelectatic  changes noted dependently within the lower lobes bilaterally. Lungs are otherwise clear. No focal infiltrates. No pulmonary edema or pleural effusion. No pneumothorax. No worrisome pulmonary nodule or mass.  Upper Abdomen: Postsurgical changes noted about the GE junction. Remainder the visualized upper abdomen otherwise unremarkable.  Musculoskeletal: No acute osseous abnormality. No worrisome lytic or blastic osseous lesions.  Review of the MIP images confirms the above findings.  IMPRESSION: 1. No CT evidence for acute pulmonary embolism. 2. No other acute cardiopulmonary abnormality identified. 3. 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   Electronically Signed   By: Rise Mu M.D.   On: 03/24/2016 03:52          *[place]*                   *[facility]*  [address]                        [place], [place] [address]                            [phone]  ------------------------------------------------------------------- Transthoracic Echocardiography  Patient:    Delma, Drone MR #:       [id number] Study Date: 03/24/2016 Gender:     F Age:        41 Height:     160 cm Weight:     118.6 kg BSA:        2.37 m^2 Pt. Status: Room:       2W32C   Mora Appl [id number]  VHQIONGEX    Richarda Overlie [id number]  PERFORMING   [facility], Inpatient  SONOGRAPHER  Leta Jungling, RDCS  ATTENDING    Cardama, Amadeo Garnet  ADMITTING    Katrinka Blazing, Rondell A  cc:  ------------------------------------------------------------------- LV EF: 60% -   65%  ------------------------------------------------------------------- Indications:      Syncope 780.2.  ------------------------------------------------------------------- History:   Risk factors:  Hypertension. Dyslipidemia.  ------------------------------------------------------------------- Study Conclusions  - Left ventricle:  The cavity size was normal. Wall thickness was   normal. Systolic function was normal. The estimated ejection   fraction was in the range of 60% to 65%. Wall motion was normal;   there were no regional wall motion abnormalities. Features are   consistent with a pseudonormal left ventricular filling pattern,   with concomitant abnormal relaxation and increased filling   pressure (grade 2 diastolic dysfunction). - Aortic valve: There was no stenosis. - Mitral valve: There was no significant regurgitation. - Right ventricle: The cavity size was normal. Systolic function   was normal. - Tricuspid valve: Peak RV-RA gradient (S): 28 mm Hg. - Pulmonary arteries: PA peak pressure: 31 mm Hg (S). - Inferior vena cava: The vessel was normal in size. The   respirophasic diameter changes were in the normal range (>= 50%),   consistent with normal central venous pressure.  Impressions:  - Normal LV size with EF 60-65%. Moderate diastolic dysfunction.   Normal RV size and systolic function. No significant valvular   abnormalities.  ------------------------------------------------------------------- Study data:  No prior study was available for comparison.  Study status:  Routine.  Procedure:  The patient reported no pain pre or post test. Transthoracic echocardiography. Image quality was poor. The study was technically difficult, as a result of body habitus. Intravenous contrast (Definity) was administered.  Study completion:  There were no complications.          Transthoracic echocardiography.  M-mode, complete 2D, spectral Doppler, and color Doppler.  Birthdate:  Patient birthdate: [birth year].  Age:  Patient is 71 yr old.  Sex:  Gender: female.    BMI: 46.3 kg/m^2.  Blood pressure:     114/53  Patient status:  Inpatient.  Study date: Study date: 03/24/2016. Study time: 11:58 AM.  Location:  Bedside.    -------------------------------------------------------------------  ------------------------------------------------------------------- Left ventricle:  The cavity size was normal. Wall thickness was normal. Systolic function was normal. The estimated ejection fraction was in the range of 60% to 65%. Wall motion was normal; there were no regional wall motion abnormalities. Features are consistent with a pseudonormal left ventricular filling pattern, with concomitant abnormal relaxation and increased filling pressure (grade 2 diastolic dysfunction).  ------------------------------------------------------------------- Aortic valve:   Trileaflet; mildly  calcified leaflets.  Doppler: There was no stenosis.   There was no regurgitation.  ------------------------------------------------------------------- Aorta:  Aortic root: The aortic root was normal in size. Ascending aorta: The ascending aorta was normal in size.  ------------------------------------------------------------------- Mitral valve:   Normal thickness leaflets .  Doppler:   There was no evidence for stenosis.   There was no significant regurgitation.    Peak gradient (D): 3 mm Hg.  ------------------------------------------------------------------- Left atrium:  The atrium was normal in size.  ------------------------------------------------------------------- Right ventricle:  The cavity size was normal. Systolic function was normal.  ------------------------------------------------------------------- Pulmonic valve:    Structurally normal valve.   Cusp separation was normal.  Doppler:  Transvalvular velocity was within the normal range. There was no regurgitation.  ------------------------------------------------------------------- Tricuspid valve:   Doppler:  There was trivial regurgitation.   ------------------------------------------------------------------- Right atrium:  The atrium was normal in  size.  ------------------------------------------------------------------- Pericardium:  There was no pericardial effusion.  ------------------------------------------------------------------- Systemic veins: Inferior vena cava: The vessel was normal in size. The respirophasic diameter changes were in the normal range (>= 50%), consistent with normal central venous pressure.  ------------------------------------------------------------------- Measurements   Left ventricle                         Value        Reference  LV ID, ED, PLAX chordal                49.9  mm     43 - 52  LV ID, ES, PLAX chordal                31.8  mm     23 - 38  LV fx shortening, PLAX chordal         36    %      >=29  LV PW thickness, ED                    9.97  mm     ---------  IVS/LV PW ratio, ED                    1.18         <=1.3  Stroke volume, 2D                      66    ml     ---------  Stroke volume/bsa, 2D                  28    ml/m^2 ---------  LV e&', lateral                         11.6  cm/s   ---------  LV E/e&', lateral                       8.06         ---------  LV e&', medial                          6.86  cm/s   ---------  LV E/e&', medial                        13.63        ---------  LV e&', average  9.23  cm/s   ---------  LV E/e&', average                       10.13        ---------    Ventricular septum                     Value        Reference  IVS thickness, ED                      11.8  mm     ---------    LVOT                                   Value        Reference  LVOT ID, S                             19    mm     ---------  LVOT area                              2.84  cm^2   ---------  LVOT peak velocity, S                  122   cm/s   ---------  LVOT mean velocity, S                  73.4  cm/s   ---------  LVOT VTI, S                            23.4  cm     ---------  LVOT peak gradient, S                  6     mm Hg   ---------    Aorta                                  Value        Reference  Aortic root ID, ED                     29    mm     ---------    Left atrium                            Value        Reference  LA ID, A-P, ES                         43    mm     ---------  LA ID/bsa, A-P                         1.82  cm/m^2 <=2.2  LA volume, S                           59.9  ml     ---------  LA volume/bsa, S  25.3  ml/m^2 ---------  LA volume, ES, 1-p A4C                 62.8  ml     ---------  LA volume/bsa, ES, 1-p A4C             26.5  ml/m^2 ---------  LA volume, ES, 1-p A2C                 56.6  ml     ---------  LA volume/bsa, ES, 1-p A2C             23.9  ml/m^2 ---------    Mitral valve                           Value        Reference  Mitral E-wave peak velocity            93.5  cm/s   ---------  Mitral A-wave peak velocity            80.4  cm/s   ---------  Mitral deceleration time       (H)     278   ms     150 - 230  Mitral peak gradient, D                3     mm Hg  ---------  Mitral E/A ratio, peak                 1.2          ---------    Pulmonary arteries                     Value        Reference  PA pressure, S, DP             (H)     31    mm Hg  <=30    Tricuspid valve                        Value        Reference  Tricuspid regurg peak velocity         263   cm/s   ---------  Tricuspid peak RV-RA gradient          28    mm Hg  ---------    Systemic veins                         Value        Reference  Estimated CVP                          3     mm Hg  ---------    Right ventricle                        Value        Reference  TAPSE                                  21.4  mm     ---------  RV s&', lateral, S  12.7  cm/s   ---------  Legend: (L)  and  (H)  mark values outside specified reference range.  ------------------------------------------------------------------- Prepared and Electronically Authenticated  by  Marca Ancona, M.D. 2018-02-17T15:43:25     Impression:  She has a 4.2 cm fusiform ascending aortic aneurysm with a trileaflet aortic valve with no stenosis or insufficiency. This is well-below the surgical threshold of 5.5 cm but should be followed by CT for enlargement. She would probably not be a candidate for surgical treatment unless her physical condition improved due to morbid obesity, reduced mobility, oxygen dependent resting hypoxemia and obesity/hypoventilation. I reviewed the CT images with her and answered her questions. I stressed the importance of good blood pressure control. She should be on a beta blocker to decrease aortic wall shear stress if possible. I think weight loss would help her respiratory status and decrease her risk for any needed surgery in the future.   Plan:  I will plan to repeat her CTA of the chest in one year and will see her back at that time. Otherwise she will continue to follow up with [name].  I spent 45 minutes performing this consultation and > 50% of this time was spent face to face counseling and coordinating the care of this patient's ascending aortic aneurysm.  Alleen Borne, MD [facility] [phone]

## 2016-04-30 ENCOUNTER — Institutional Professional Consult (permissible substitution): Payer: PPO | Admitting: Pulmonary Disease

## 2016-05-01 DIAGNOSIS — I129 Hypertensive chronic kidney disease with stage 1 through stage 4 chronic kidney disease, or unspecified chronic kidney disease: Secondary | ICD-10-CM | POA: Diagnosis not present

## 2016-05-01 DIAGNOSIS — E7800 Pure hypercholesterolemia, unspecified: Secondary | ICD-10-CM | POA: Diagnosis not present

## 2016-05-01 DIAGNOSIS — J45909 Unspecified asthma, uncomplicated: Secondary | ICD-10-CM | POA: Diagnosis not present

## 2016-05-01 DIAGNOSIS — E039 Hypothyroidism, unspecified: Secondary | ICD-10-CM | POA: Diagnosis not present

## 2016-05-01 DIAGNOSIS — F319 Bipolar disorder, unspecified: Secondary | ICD-10-CM | POA: Diagnosis not present

## 2016-05-01 DIAGNOSIS — Z9981 Dependence on supplemental oxygen: Secondary | ICD-10-CM | POA: Diagnosis not present

## 2016-05-01 DIAGNOSIS — M48061 Spinal stenosis, lumbar region without neurogenic claudication: Secondary | ICD-10-CM | POA: Diagnosis not present

## 2016-05-01 DIAGNOSIS — N183 Chronic kidney disease, stage 3 (moderate): Secondary | ICD-10-CM | POA: Diagnosis not present

## 2016-05-01 DIAGNOSIS — M797 Fibromyalgia: Secondary | ICD-10-CM | POA: Diagnosis not present

## 2016-05-01 DIAGNOSIS — Z9181 History of falling: Secondary | ICD-10-CM | POA: Diagnosis not present

## 2016-05-01 DIAGNOSIS — G43909 Migraine, unspecified, not intractable, without status migrainosus: Secondary | ICD-10-CM | POA: Diagnosis not present

## 2016-05-03 DIAGNOSIS — E7800 Pure hypercholesterolemia, unspecified: Secondary | ICD-10-CM | POA: Diagnosis not present

## 2016-05-03 DIAGNOSIS — E039 Hypothyroidism, unspecified: Secondary | ICD-10-CM | POA: Diagnosis not present

## 2016-05-03 DIAGNOSIS — G43909 Migraine, unspecified, not intractable, without status migrainosus: Secondary | ICD-10-CM | POA: Diagnosis not present

## 2016-05-03 DIAGNOSIS — Z9181 History of falling: Secondary | ICD-10-CM | POA: Diagnosis not present

## 2016-05-03 DIAGNOSIS — N183 Chronic kidney disease, stage 3 (moderate): Secondary | ICD-10-CM | POA: Diagnosis not present

## 2016-05-03 DIAGNOSIS — M797 Fibromyalgia: Secondary | ICD-10-CM | POA: Diagnosis not present

## 2016-05-03 DIAGNOSIS — J45909 Unspecified asthma, uncomplicated: Secondary | ICD-10-CM | POA: Diagnosis not present

## 2016-05-03 DIAGNOSIS — Z9981 Dependence on supplemental oxygen: Secondary | ICD-10-CM | POA: Diagnosis not present

## 2016-05-03 DIAGNOSIS — F319 Bipolar disorder, unspecified: Secondary | ICD-10-CM | POA: Diagnosis not present

## 2016-05-03 DIAGNOSIS — M48061 Spinal stenosis, lumbar region without neurogenic claudication: Secondary | ICD-10-CM | POA: Diagnosis not present

## 2016-05-03 DIAGNOSIS — I129 Hypertensive chronic kidney disease with stage 1 through stage 4 chronic kidney disease, or unspecified chronic kidney disease: Secondary | ICD-10-CM | POA: Diagnosis not present

## 2016-05-07 ENCOUNTER — Ambulatory Visit: Payer: PPO | Admitting: Registered"

## 2016-05-07 DIAGNOSIS — I129 Hypertensive chronic kidney disease with stage 1 through stage 4 chronic kidney disease, or unspecified chronic kidney disease: Secondary | ICD-10-CM | POA: Diagnosis not present

## 2016-05-07 DIAGNOSIS — R0902 Hypoxemia: Secondary | ICD-10-CM | POA: Diagnosis not present

## 2016-05-07 DIAGNOSIS — J45909 Unspecified asthma, uncomplicated: Secondary | ICD-10-CM | POA: Diagnosis not present

## 2016-05-07 DIAGNOSIS — R55 Syncope and collapse: Secondary | ICD-10-CM | POA: Diagnosis not present

## 2016-05-08 DIAGNOSIS — N183 Chronic kidney disease, stage 3 (moderate): Secondary | ICD-10-CM | POA: Diagnosis not present

## 2016-05-08 DIAGNOSIS — Z9181 History of falling: Secondary | ICD-10-CM | POA: Diagnosis not present

## 2016-05-08 DIAGNOSIS — M797 Fibromyalgia: Secondary | ICD-10-CM | POA: Diagnosis not present

## 2016-05-08 DIAGNOSIS — J45909 Unspecified asthma, uncomplicated: Secondary | ICD-10-CM | POA: Diagnosis not present

## 2016-05-08 DIAGNOSIS — E039 Hypothyroidism, unspecified: Secondary | ICD-10-CM | POA: Diagnosis not present

## 2016-05-08 DIAGNOSIS — F319 Bipolar disorder, unspecified: Secondary | ICD-10-CM | POA: Diagnosis not present

## 2016-05-08 DIAGNOSIS — G43909 Migraine, unspecified, not intractable, without status migrainosus: Secondary | ICD-10-CM | POA: Diagnosis not present

## 2016-05-08 DIAGNOSIS — M48061 Spinal stenosis, lumbar region without neurogenic claudication: Secondary | ICD-10-CM | POA: Diagnosis not present

## 2016-05-08 DIAGNOSIS — E7800 Pure hypercholesterolemia, unspecified: Secondary | ICD-10-CM | POA: Diagnosis not present

## 2016-05-08 DIAGNOSIS — Z9981 Dependence on supplemental oxygen: Secondary | ICD-10-CM | POA: Diagnosis not present

## 2016-05-08 DIAGNOSIS — I129 Hypertensive chronic kidney disease with stage 1 through stage 4 chronic kidney disease, or unspecified chronic kidney disease: Secondary | ICD-10-CM | POA: Diagnosis not present

## 2016-05-09 DIAGNOSIS — J45909 Unspecified asthma, uncomplicated: Secondary | ICD-10-CM | POA: Diagnosis not present

## 2016-05-09 DIAGNOSIS — N183 Chronic kidney disease, stage 3 (moderate): Secondary | ICD-10-CM | POA: Diagnosis not present

## 2016-05-09 DIAGNOSIS — M797 Fibromyalgia: Secondary | ICD-10-CM | POA: Diagnosis not present

## 2016-05-09 DIAGNOSIS — I129 Hypertensive chronic kidney disease with stage 1 through stage 4 chronic kidney disease, or unspecified chronic kidney disease: Secondary | ICD-10-CM | POA: Diagnosis not present

## 2016-05-09 DIAGNOSIS — F319 Bipolar disorder, unspecified: Secondary | ICD-10-CM | POA: Diagnosis not present

## 2016-05-09 DIAGNOSIS — M48061 Spinal stenosis, lumbar region without neurogenic claudication: Secondary | ICD-10-CM | POA: Diagnosis not present

## 2016-05-09 DIAGNOSIS — E7800 Pure hypercholesterolemia, unspecified: Secondary | ICD-10-CM | POA: Diagnosis not present

## 2016-05-09 DIAGNOSIS — Z9181 History of falling: Secondary | ICD-10-CM | POA: Diagnosis not present

## 2016-05-09 DIAGNOSIS — E039 Hypothyroidism, unspecified: Secondary | ICD-10-CM | POA: Diagnosis not present

## 2016-05-09 DIAGNOSIS — G43909 Migraine, unspecified, not intractable, without status migrainosus: Secondary | ICD-10-CM | POA: Diagnosis not present

## 2016-05-09 DIAGNOSIS — Z9981 Dependence on supplemental oxygen: Secondary | ICD-10-CM | POA: Diagnosis not present

## 2016-05-10 DIAGNOSIS — I129 Hypertensive chronic kidney disease with stage 1 through stage 4 chronic kidney disease, or unspecified chronic kidney disease: Secondary | ICD-10-CM | POA: Diagnosis not present

## 2016-05-10 DIAGNOSIS — R55 Syncope and collapse: Secondary | ICD-10-CM | POA: Diagnosis not present

## 2016-05-10 DIAGNOSIS — J45909 Unspecified asthma, uncomplicated: Secondary | ICD-10-CM | POA: Diagnosis not present

## 2016-05-10 DIAGNOSIS — R0902 Hypoxemia: Secondary | ICD-10-CM | POA: Diagnosis not present

## 2016-05-14 DIAGNOSIS — M48061 Spinal stenosis, lumbar region without neurogenic claudication: Secondary | ICD-10-CM | POA: Diagnosis not present

## 2016-05-14 DIAGNOSIS — J45909 Unspecified asthma, uncomplicated: Secondary | ICD-10-CM | POA: Diagnosis not present

## 2016-05-14 DIAGNOSIS — E039 Hypothyroidism, unspecified: Secondary | ICD-10-CM | POA: Diagnosis not present

## 2016-05-14 DIAGNOSIS — M797 Fibromyalgia: Secondary | ICD-10-CM | POA: Diagnosis not present

## 2016-05-14 DIAGNOSIS — E7800 Pure hypercholesterolemia, unspecified: Secondary | ICD-10-CM | POA: Diagnosis not present

## 2016-05-14 DIAGNOSIS — I129 Hypertensive chronic kidney disease with stage 1 through stage 4 chronic kidney disease, or unspecified chronic kidney disease: Secondary | ICD-10-CM | POA: Diagnosis not present

## 2016-05-14 DIAGNOSIS — F319 Bipolar disorder, unspecified: Secondary | ICD-10-CM | POA: Diagnosis not present

## 2016-05-14 DIAGNOSIS — Z9981 Dependence on supplemental oxygen: Secondary | ICD-10-CM | POA: Diagnosis not present

## 2016-05-14 DIAGNOSIS — G43909 Migraine, unspecified, not intractable, without status migrainosus: Secondary | ICD-10-CM | POA: Diagnosis not present

## 2016-05-14 DIAGNOSIS — Z9181 History of falling: Secondary | ICD-10-CM | POA: Diagnosis not present

## 2016-05-14 DIAGNOSIS — N183 Chronic kidney disease, stage 3 (moderate): Secondary | ICD-10-CM | POA: Diagnosis not present

## 2016-05-15 DIAGNOSIS — Z9981 Dependence on supplemental oxygen: Secondary | ICD-10-CM | POA: Diagnosis not present

## 2016-05-15 DIAGNOSIS — F319 Bipolar disorder, unspecified: Secondary | ICD-10-CM | POA: Diagnosis not present

## 2016-05-15 DIAGNOSIS — Z9181 History of falling: Secondary | ICD-10-CM | POA: Diagnosis not present

## 2016-05-15 DIAGNOSIS — I129 Hypertensive chronic kidney disease with stage 1 through stage 4 chronic kidney disease, or unspecified chronic kidney disease: Secondary | ICD-10-CM | POA: Diagnosis not present

## 2016-05-15 DIAGNOSIS — E7800 Pure hypercholesterolemia, unspecified: Secondary | ICD-10-CM | POA: Diagnosis not present

## 2016-05-15 DIAGNOSIS — J45909 Unspecified asthma, uncomplicated: Secondary | ICD-10-CM | POA: Diagnosis not present

## 2016-05-15 DIAGNOSIS — M48061 Spinal stenosis, lumbar region without neurogenic claudication: Secondary | ICD-10-CM | POA: Diagnosis not present

## 2016-05-15 DIAGNOSIS — N183 Chronic kidney disease, stage 3 (moderate): Secondary | ICD-10-CM | POA: Diagnosis not present

## 2016-05-15 DIAGNOSIS — M797 Fibromyalgia: Secondary | ICD-10-CM | POA: Diagnosis not present

## 2016-05-15 DIAGNOSIS — E039 Hypothyroidism, unspecified: Secondary | ICD-10-CM | POA: Diagnosis not present

## 2016-05-15 DIAGNOSIS — G43909 Migraine, unspecified, not intractable, without status migrainosus: Secondary | ICD-10-CM | POA: Diagnosis not present

## 2016-05-18 DIAGNOSIS — M1712 Unilateral primary osteoarthritis, left knee: Secondary | ICD-10-CM | POA: Diagnosis not present

## 2016-05-18 DIAGNOSIS — I129 Hypertensive chronic kidney disease with stage 1 through stage 4 chronic kidney disease, or unspecified chronic kidney disease: Secondary | ICD-10-CM | POA: Diagnosis not present

## 2016-05-18 DIAGNOSIS — M170 Bilateral primary osteoarthritis of knee: Secondary | ICD-10-CM | POA: Diagnosis not present

## 2016-05-18 DIAGNOSIS — M797 Fibromyalgia: Secondary | ICD-10-CM | POA: Diagnosis not present

## 2016-05-18 DIAGNOSIS — G8929 Other chronic pain: Secondary | ICD-10-CM | POA: Diagnosis not present

## 2016-05-18 DIAGNOSIS — Z9981 Dependence on supplemental oxygen: Secondary | ICD-10-CM | POA: Diagnosis not present

## 2016-05-18 DIAGNOSIS — F319 Bipolar disorder, unspecified: Secondary | ICD-10-CM | POA: Diagnosis not present

## 2016-05-18 DIAGNOSIS — J45909 Unspecified asthma, uncomplicated: Secondary | ICD-10-CM | POA: Diagnosis not present

## 2016-05-18 DIAGNOSIS — E039 Hypothyroidism, unspecified: Secondary | ICD-10-CM | POA: Diagnosis not present

## 2016-05-18 DIAGNOSIS — M1711 Unilateral primary osteoarthritis, right knee: Secondary | ICD-10-CM | POA: Diagnosis not present

## 2016-05-18 DIAGNOSIS — M48061 Spinal stenosis, lumbar region without neurogenic claudication: Secondary | ICD-10-CM | POA: Diagnosis not present

## 2016-05-18 DIAGNOSIS — E7800 Pure hypercholesterolemia, unspecified: Secondary | ICD-10-CM | POA: Diagnosis not present

## 2016-05-18 DIAGNOSIS — M25562 Pain in left knee: Secondary | ICD-10-CM | POA: Diagnosis not present

## 2016-05-18 DIAGNOSIS — M25561 Pain in right knee: Secondary | ICD-10-CM | POA: Diagnosis not present

## 2016-05-18 DIAGNOSIS — N183 Chronic kidney disease, stage 3 (moderate): Secondary | ICD-10-CM | POA: Diagnosis not present

## 2016-05-18 DIAGNOSIS — Z9181 History of falling: Secondary | ICD-10-CM | POA: Diagnosis not present

## 2016-05-18 DIAGNOSIS — G43909 Migraine, unspecified, not intractable, without status migrainosus: Secondary | ICD-10-CM | POA: Diagnosis not present

## 2016-05-21 DIAGNOSIS — F319 Bipolar disorder, unspecified: Secondary | ICD-10-CM | POA: Diagnosis not present

## 2016-05-21 DIAGNOSIS — I129 Hypertensive chronic kidney disease with stage 1 through stage 4 chronic kidney disease, or unspecified chronic kidney disease: Secondary | ICD-10-CM | POA: Diagnosis not present

## 2016-05-21 DIAGNOSIS — G43909 Migraine, unspecified, not intractable, without status migrainosus: Secondary | ICD-10-CM | POA: Diagnosis not present

## 2016-05-21 DIAGNOSIS — E039 Hypothyroidism, unspecified: Secondary | ICD-10-CM | POA: Diagnosis not present

## 2016-05-21 DIAGNOSIS — Z9181 History of falling: Secondary | ICD-10-CM | POA: Diagnosis not present

## 2016-05-21 DIAGNOSIS — E7800 Pure hypercholesterolemia, unspecified: Secondary | ICD-10-CM | POA: Diagnosis not present

## 2016-05-21 DIAGNOSIS — Z9981 Dependence on supplemental oxygen: Secondary | ICD-10-CM | POA: Diagnosis not present

## 2016-05-21 DIAGNOSIS — N183 Chronic kidney disease, stage 3 (moderate): Secondary | ICD-10-CM | POA: Diagnosis not present

## 2016-05-21 DIAGNOSIS — M797 Fibromyalgia: Secondary | ICD-10-CM | POA: Diagnosis not present

## 2016-05-21 DIAGNOSIS — J45909 Unspecified asthma, uncomplicated: Secondary | ICD-10-CM | POA: Diagnosis not present

## 2016-05-21 DIAGNOSIS — M48061 Spinal stenosis, lumbar region without neurogenic claudication: Secondary | ICD-10-CM | POA: Diagnosis not present

## 2016-05-23 DIAGNOSIS — M4800 Spinal stenosis, site unspecified: Secondary | ICD-10-CM | POA: Diagnosis not present

## 2016-05-23 DIAGNOSIS — R2681 Unsteadiness on feet: Secondary | ICD-10-CM | POA: Diagnosis not present

## 2016-05-24 ENCOUNTER — Emergency Department (HOSPITAL_COMMUNITY): Payer: PPO

## 2016-05-24 ENCOUNTER — Encounter (HOSPITAL_COMMUNITY): Payer: Self-pay | Admitting: *Deleted

## 2016-05-24 ENCOUNTER — Observation Stay (HOSPITAL_COMMUNITY)
Admission: EM | Admit: 2016-05-24 | Discharge: 2016-05-26 | Disposition: A | Discharge status: Home / Self Care | Payer: PPO | Attending: Internal Medicine | Admitting: Internal Medicine

## 2016-05-24 DIAGNOSIS — Z9981 Dependence on supplemental oxygen: Secondary | ICD-10-CM | POA: Insufficient documentation

## 2016-05-24 DIAGNOSIS — G43909 Migraine, unspecified, not intractable, without status migrainosus: Secondary | ICD-10-CM | POA: Insufficient documentation

## 2016-05-24 DIAGNOSIS — S43014A Anterior dislocation of right humerus, initial encounter: Secondary | ICD-10-CM | POA: Insufficient documentation

## 2016-05-24 DIAGNOSIS — I9589 Other hypotension: Secondary | ICD-10-CM

## 2016-05-24 DIAGNOSIS — S80211A Abrasion, right knee, initial encounter: Secondary | ICD-10-CM | POA: Diagnosis not present

## 2016-05-24 DIAGNOSIS — D649 Anemia, unspecified: Secondary | ICD-10-CM | POA: Diagnosis not present

## 2016-05-24 DIAGNOSIS — E785 Hyperlipidemia, unspecified: Secondary | ICD-10-CM | POA: Insufficient documentation

## 2016-05-24 DIAGNOSIS — S79919A Unspecified injury of unspecified hip, initial encounter: Secondary | ICD-10-CM | POA: Diagnosis not present

## 2016-05-24 DIAGNOSIS — E271 Primary adrenocortical insufficiency: Secondary | ICD-10-CM | POA: Insufficient documentation

## 2016-05-24 DIAGNOSIS — M21821 Other specified acquired deformities of right upper arm: Secondary | ICD-10-CM | POA: Diagnosis not present

## 2016-05-24 DIAGNOSIS — Z6841 Body Mass Index (BMI) 40.0 and over, adult: Secondary | ICD-10-CM | POA: Diagnosis not present

## 2016-05-24 DIAGNOSIS — M1990 Unspecified osteoarthritis, unspecified site: Secondary | ICD-10-CM | POA: Diagnosis not present

## 2016-05-24 DIAGNOSIS — M797 Fibromyalgia: Secondary | ICD-10-CM | POA: Diagnosis not present

## 2016-05-24 DIAGNOSIS — F319 Bipolar disorder, unspecified: Secondary | ICD-10-CM | POA: Diagnosis present

## 2016-05-24 DIAGNOSIS — M25511 Pain in right shoulder: Secondary | ICD-10-CM | POA: Diagnosis not present

## 2016-05-24 DIAGNOSIS — S42291A Other displaced fracture of upper end of right humerus, initial encounter for closed fracture: Secondary | ICD-10-CM

## 2016-05-24 DIAGNOSIS — I959 Hypotension, unspecified: Secondary | ICD-10-CM | POA: Diagnosis present

## 2016-05-24 DIAGNOSIS — G4700 Insomnia, unspecified: Secondary | ICD-10-CM | POA: Insufficient documentation

## 2016-05-24 DIAGNOSIS — K219 Gastro-esophageal reflux disease without esophagitis: Secondary | ICD-10-CM | POA: Insufficient documentation

## 2016-05-24 DIAGNOSIS — I10 Essential (primary) hypertension: Secondary | ICD-10-CM | POA: Diagnosis not present

## 2016-05-24 DIAGNOSIS — R55 Syncope and collapse: Secondary | ICD-10-CM | POA: Diagnosis not present

## 2016-05-24 DIAGNOSIS — Z9884 Bariatric surgery status: Secondary | ICD-10-CM | POA: Insufficient documentation

## 2016-05-24 DIAGNOSIS — R739 Hyperglycemia, unspecified: Secondary | ICD-10-CM | POA: Insufficient documentation

## 2016-05-24 DIAGNOSIS — E039 Hypothyroidism, unspecified: Secondary | ICD-10-CM | POA: Diagnosis not present

## 2016-05-24 DIAGNOSIS — W19XXXA Unspecified fall, initial encounter: Secondary | ICD-10-CM | POA: Insufficient documentation

## 2016-05-24 DIAGNOSIS — S43004A Unspecified dislocation of right shoulder joint, initial encounter: Secondary | ICD-10-CM | POA: Diagnosis not present

## 2016-05-24 DIAGNOSIS — G629 Polyneuropathy, unspecified: Secondary | ICD-10-CM | POA: Diagnosis not present

## 2016-05-24 DIAGNOSIS — I712 Thoracic aortic aneurysm, without rupture: Secondary | ICD-10-CM | POA: Insufficient documentation

## 2016-05-24 DIAGNOSIS — R937 Abnormal findings on diagnostic imaging of other parts of musculoskeletal system: Secondary | ICD-10-CM | POA: Diagnosis not present

## 2016-05-24 DIAGNOSIS — S50311A Abrasion of right elbow, initial encounter: Secondary | ICD-10-CM | POA: Diagnosis not present

## 2016-05-24 LAB — TROPONIN I: Troponin I: <0.03 ng/mL (ref <0.03)

## 2016-05-24 LAB — URINALYSIS, ROUTINE W REFLEX MICROSCOPIC
Bilirubin Urine: NEGATIVE
Glucose, UA: NEGATIVE mg/dL
Hgb urine dipstick: NEGATIVE
Ketones, ur: NEGATIVE mg/dL
Leukocytes, UA: NEGATIVE
Nitrite: NEGATIVE
Protein, ur: NEGATIVE mg/dL
Specific Gravity, Urine: 1.015 (ref 1.005–1.030)
pH: 5 (ref 5.0–8.0)

## 2016-05-24 LAB — COMPREHENSIVE METABOLIC PANEL
ALT: 12 U/L — ABNORMAL LOW (ref 14–54)
AST: 21 U/L (ref 15–41)
Albumin: 3.6 g/dL (ref 3.5–5.0)
Alkaline Phosphatase: 82 U/L (ref 38–126)
Anion gap: 10 (ref 5–15)
BUN: 21 mg/dL — ABNORMAL HIGH (ref 6–20)
CO2: 30 mmol/L (ref 22–32)
Calcium: 8.8 mg/dL — ABNORMAL LOW (ref 8.9–10.3)
Chloride: 97 mmol/L — ABNORMAL LOW (ref 101–111)
Creatinine, Ser: 1.05 mg/dL — ABNORMAL HIGH (ref 0.44–1.00)
GFR calc Af Amer: >60 mL/min (ref >60)
GFR calc non Af Amer: 53 mL/min — ABNORMAL LOW (ref >60)
Glucose, Bld: 123 mg/dL — ABNORMAL HIGH (ref 65–99)
Potassium: 4.3 mmol/L (ref 3.5–5.1)
Sodium: 137 mmol/L (ref 135–145)
Total Bilirubin: 0.5 mg/dL (ref 0.3–1.2)
Total Protein: 5.8 g/dL — ABNORMAL LOW (ref 6.5–8.1)

## 2016-05-24 LAB — CBC WITH DIFFERENTIAL/PLATELET
Basophils Absolute: 0 10*3/uL (ref 0.0–0.1)
Basophils Relative: 0 %
Eosinophils Absolute: 0.1 10*3/uL (ref 0.0–0.7)
Eosinophils Relative: 1 %
HCT: 38.2 % (ref 36.0–46.0)
Hemoglobin: 11.9 g/dL — ABNORMAL LOW (ref 12.0–15.0)
Lymphocytes Relative: 19 %
Lymphs Abs: 2 10*3/uL (ref 0.7–4.0)
MCH: 29.5 pg (ref 26.0–34.0)
MCHC: 31.2 g/dL (ref 30.0–36.0)
MCV: 94.8 fL (ref 78.0–100.0)
Monocytes Absolute: 0.8 10*3/uL (ref 0.1–1.0)
Monocytes Relative: 8 %
Neutro Abs: 7.5 10*3/uL (ref 1.7–7.7)
Neutrophils Relative %: 72 %
Platelets: 223 10*3/uL (ref 150–400)
RBC: 4.03 MIL/uL (ref 3.87–5.11)
RDW: 15.1 % (ref 11.5–15.5)
WBC: 10.4 10*3/uL (ref 4.0–10.5)

## 2016-05-24 LAB — I-STAT TROPONIN, ED: Troponin i, poc: 0.01 ng/mL (ref 0.00–0.08)

## 2016-05-24 MED ORDER — MELOXICAM 7.5 MG PO TABS
15.0000 mg | ORAL_TABLET | Freq: Every day | ORAL | Status: DC
  Administered 2016-05-24 – 2016-05-26 (×3): 15 mg via ORAL
  Filled 2016-05-24 (×3): qty 2

## 2016-05-24 MED ORDER — LIDOCAINE HCL 1 % IJ SOLN
30.0000 mL | Freq: Once | INTRAMUSCULAR | Status: AC
  Administered 2016-05-24: 30 mL
  Filled 2016-05-24: qty 40

## 2016-05-24 MED ORDER — TRAZODONE HCL 150 MG PO TABS
300.0000 mg | ORAL_TABLET | Freq: Every day | ORAL | Status: DC
  Administered 2016-05-24: 150 mg via ORAL
  Administered 2016-05-25: 300 mg via ORAL
  Filled 2016-05-24 (×2): qty 2

## 2016-05-24 MED ORDER — BUPIVACAINE HCL (PF) 0.5 % IJ SOLN
30.0000 mL | Freq: Once | INTRAMUSCULAR | Status: AC
  Administered 2016-05-24: 30 mL
  Filled 2016-05-24: qty 30

## 2016-05-24 MED ORDER — ACETAMINOPHEN 500 MG PO TABS
1000.0000 mg | ORAL_TABLET | Freq: Once | ORAL | Status: AC
  Administered 2016-05-24: 1000 mg via ORAL

## 2016-05-24 MED ORDER — DULOXETINE HCL 60 MG PO CPEP
60.0000 mg | ORAL_CAPSULE | Freq: Every evening | ORAL | Status: DC
  Administered 2016-05-24 – 2016-05-26 (×3): 60 mg via ORAL
  Filled 2016-05-24 (×3): qty 1

## 2016-05-24 MED ORDER — PRAVASTATIN SODIUM 20 MG PO TABS
20.0000 mg | ORAL_TABLET | Freq: Every day | ORAL | Status: DC
  Administered 2016-05-24 – 2016-05-25 (×2): 20 mg via ORAL
  Filled 2016-05-24 (×2): qty 1

## 2016-05-24 MED ORDER — ONDANSETRON HCL 4 MG PO TABS: 4.0000 mg | ORAL_TABLET | Freq: Four times a day (QID) | ORAL | Status: DC | PRN

## 2016-05-24 MED ORDER — KETAMINE HCL 10 MG/ML IJ SOLN: 2.0000 mg/kg | Freq: Once | INTRAMUSCULAR | Status: DC

## 2016-05-24 MED ORDER — METHOCARBAMOL 500 MG PO TABS
500.0000 mg | ORAL_TABLET | Freq: Four times a day (QID) | ORAL | Status: DC | PRN
  Administered 2016-05-26 (×2): 1000 mg via ORAL
  Filled 2016-05-24 (×2): qty 2

## 2016-05-24 MED ORDER — SODIUM CHLORIDE 0.9 % IV BOLUS (SEPSIS)
1000.0000 mL | Freq: Once | INTRAVENOUS | Status: AC
  Administered 2016-05-24: 1000 mL via INTRAVENOUS

## 2016-05-24 MED ORDER — ENOXAPARIN SODIUM 40 MG/0.4ML ~~LOC~~ SOLN
40.0000 mg | SUBCUTANEOUS | Status: DC
  Administered 2016-05-24 – 2016-05-26 (×2): 40 mg via SUBCUTANEOUS
  Filled 2016-05-24 (×2): qty 0.4

## 2016-05-24 MED ORDER — KETAMINE HCL-SODIUM CHLORIDE 100-0.9 MG/10ML-% IV SOSY
1.0000 mg/kg | PREFILLED_SYRINGE | Freq: Once | INTRAVENOUS | Status: AC
Start: 2016-05-24 — End: 2016-05-24
  Administered 2016-05-24: 118 mg via INTRAVENOUS
  Filled 2016-05-24: qty 20

## 2016-05-24 MED ORDER — OXYCODONE HCL 5 MG PO TABS
5.0000 mg | ORAL_TABLET | Freq: Four times a day (QID) | ORAL | Status: DC | PRN
  Administered 2016-05-24 – 2016-05-26 (×8): 5 mg via ORAL
  Filled 2016-05-24 (×10): qty 1

## 2016-05-24 MED ORDER — ACETAMINOPHEN 500 MG PO TABS
ORAL_TABLET | ORAL | Status: AC
  Filled 2016-05-24: qty 2

## 2016-05-24 MED ORDER — PREGABALIN 100 MG PO CAPS
100.0000 mg | ORAL_CAPSULE | Freq: Two times a day (BID) | ORAL | Status: DC
  Administered 2016-05-24 – 2016-05-26 (×5): 100 mg via ORAL
  Filled 2016-05-24 (×5): qty 1

## 2016-05-24 MED ORDER — KETOROLAC TROMETHAMINE 15 MG/ML IJ SOLN
15.0000 mg | Freq: Three times a day (TID) | INTRAMUSCULAR | Status: DC | PRN
  Administered 2016-05-24 – 2016-05-26 (×4): 15 mg via INTRAVENOUS
  Filled 2016-05-24 (×4): qty 1

## 2016-05-24 MED ORDER — DULOXETINE HCL 30 MG PO CPEP
30.0000 mg | ORAL_CAPSULE | Freq: Every day | ORAL | Status: DC
  Administered 2016-05-25 – 2016-05-26 (×2): 30 mg via ORAL
  Filled 2016-05-24 (×3): qty 1

## 2016-05-24 MED ORDER — PANTOPRAZOLE SODIUM 40 MG PO TBEC
40.0000 mg | DELAYED_RELEASE_TABLET | Freq: Every day | ORAL | Status: DC
  Administered 2016-05-24 – 2016-05-26 (×3): 40 mg via ORAL
  Filled 2016-05-24 (×3): qty 1

## 2016-05-24 MED ORDER — LEVOTHYROXINE SODIUM 75 MCG PO TABS
75.0000 ug | ORAL_TABLET | Freq: Every day | ORAL | Status: DC
  Administered 2016-05-25 – 2016-05-26 (×2): 75 ug via ORAL
  Filled 2016-05-24 (×2): qty 1

## 2016-05-24 MED ORDER — SODIUM CHLORIDE 0.9% FLUSH
3.0000 mL | Freq: Two times a day (BID) | INTRAVENOUS | Status: DC
  Administered 2016-05-24 – 2016-05-26 (×3): 3 mL via INTRAVENOUS

## 2016-05-24 MED ORDER — ACETAMINOPHEN 325 MG PO TABS
650.0000 mg | ORAL_TABLET | Freq: Four times a day (QID) | ORAL | Status: DC | PRN
  Administered 2016-05-26: 650 mg via ORAL
  Filled 2016-05-24: qty 2

## 2016-05-24 MED ORDER — SUMATRIPTAN SUCCINATE 100 MG PO TABS: 100.0000 mg | ORAL_TABLET | ORAL | Status: DC | PRN

## 2016-05-24 MED ORDER — ACETAMINOPHEN 650 MG RE SUPP: 650.0000 mg | Freq: Four times a day (QID) | RECTAL | Status: DC | PRN

## 2016-05-24 MED ORDER — ONDANSETRON HCL 4 MG/2ML IJ SOLN: 4.0000 mg | Freq: Four times a day (QID) | INTRAMUSCULAR | Status: DC | PRN

## 2016-05-24 NOTE — ED Notes (Signed)
Patient in xray 

## 2016-05-24 NOTE — ED Notes (Signed)
Attempted report x1. 

## 2016-05-24 NOTE — ED Notes (Signed)
Patient taken off bedpan, repositioned in bed.  Patient requested lights down, oxygen increased to 4L due to decreased sats at 88% on 2L.

## 2016-05-24 NOTE — ED Notes (Signed)
Dr Mora Bellman attempted to reduce right shoulder.  Counter traction used.  Unsuccessful.

## 2016-05-24 NOTE — ED Notes (Signed)
Attempted report x 2 

## 2016-05-24 NOTE — ED Notes (Signed)
IV, blood, chest xray and urine on hold at this time.  Dr Mora Bellman notified and agreed with holding of these at this time.

## 2016-05-24 NOTE — ED Notes (Signed)
Patient placed on bedpan.

## 2016-05-24 NOTE — ED Triage Notes (Signed)
Patient presents from home stating she got up and passed out  Stated she knew her BP was low   C/o right shoulder pain and has numerous scratches on her arm and legs

## 2016-05-24 NOTE — ED Notes (Signed)
Patient placed on stomach with right arm dangling off bed.  5lb weight tied to patient's wrist to dangle with arm for 30 mins per Dr Mora Bellman.

## 2016-05-24 NOTE — Sedation Documentation (Signed)
Pt SpO2 decreased to 64% 4L [place], pt bagged by respiratory therapy.

## 2016-05-24 NOTE — ED Provider Notes (Signed)
[facility]-EMERGENCY DEPT Provider Note   CSN: [id number] Arrival date & time: 05/24/16  0024   By signing my name below, I, Clovis Pu, attest that this documentation has been prepared under the direction and in the presence of Tomasita Crumble, MD  Electronically Signed: Clovis Pu, ED Scribe. 05/24/16. 2:02 AM.   History   Chief Complaint Chief Complaint  Patient presents with  . Fall  . Near Syncope  . Shoulder Pain    HPI Comments:  Lindsey Rowe is a 71 y.o. female, with a PMHx of hyperlipidemia, HTN and AAA, who presents to the Emergency Department complaining of acute onset, moderate right shoulder pain s/p an incident which occurred PTA. Pt states she was walking when she syncopated and fell down. Her pain is worse with movement of her right upper extremity. No alleviating factors noted. She denies dizziness or lightheadedness prior to syncope. Pt denies any other associated symptoms. She notes she wears oxygen at home. No other complaints noted at this time.    The history is provided by the patient. No language interpreter was used.    Past Medical History:  Diagnosis Date  . Arthritis   . Back pain   . Bipolar 1 disorder (HCC)   . Complication of anesthesia    hard to wake up anesthesia  . Depression   . GERD (gastroesophageal reflux disease)   . Headache(784.0)    migraines and tension headaches  . Hyperlipidemia   . Hypertension   . Hypothyroidism   . Thyroid disease     Patient Active Problem List   Diagnosis Date Noted  . Syncope 03/24/2016  . Generalized weakness 09/24/2015  . Leg pain, bilateral 09/24/2015  . Depression 09/24/2015  . Loss of appetite 09/24/2015  . Leg weakness, bilateral   . Hypothyroid 12/28/2012  . Hypovolemia 12/28/2012  . Hypotension 08/13/2012  . Dehydration 08/13/2012  . AKI (acute kidney injury) (HCC) 08/13/2012  . Hypoventilation associated with obesity syndrome (HCC) 08/13/2012  . Acute respiratory failure (HCC)  07/26/2012  . Chest pain 07/26/2012  . ANKLE SPRAIN, RIGHT 03/16/2010  . DIVERTICULOSIS, COLON 10/31/2009  . SKIN LESION 09/23/2009  . Shoulder pain, right 07/05/2009  . COUGH 03/29/2009  . Migraine 03/11/2009  . OTITIS MEDIA, RIGHT 12/13/2008  . BRUISE 12/06/2008  . ACID REFLUX DISEASE 09/13/2008  . ALLERGIC RHINITIS 05/20/2008  . UNSPECIFIED DISEASE OF HAIR AND HAIR FOLLICLES 05/20/2008  . DERMATITIS 11/12/2007  . OBESITY 07/18/2007  . HYPERCHOLESTEROLEMIA 05/13/2007  . BIPOLAR AFFECTIVE DISORDER 05/12/2007  . HYPERTENSION, BENIGN ESSENTIAL 05/12/2007  . Low back pain potentially associated with radiculopathy 05/12/2007    Past Surgical History:  Procedure Laterality Date  . APPENDECTOMY    . BACK SURGERY    . EYE SURGERY Bilateral    lens implant  . GASTRIC BYPASS    . LUMBAR LAMINECTOMY/DECOMPRESSION MICRODISCECTOMY Right 07/23/2012   Procedure: LUMBAR LAMINECTOMY/DECOMPRESSION MICRODISCECTOMY 2 LEVELS;  Surgeon: Karn Cassis, MD;  Location: [facility];  Service: Neurosurgery;  Laterality: Right;  Right Lumbar three-four  lumbar four-five Laminectomy/Foraminotomy  . NASAL SINUS SURGERY Bilateral   . TONSILLECTOMY    . WRIST SURGERY      OB History    No data available       Home Medications    Prior to Admission medications   Medication Sig Start Date End Date Taking? Authorizing Provider  Aspirin-Salicylamide-Caffeine (BC HEADACHE POWDER PO) Take 1 packet by mouth as needed (headache, pain).    Historical Provider,  MD  Cholecalciferol (D3-1000) 1000 units capsule Take 1,000 Units by mouth daily.    Historical Provider, MD  DULoxetine (CYMBALTA) 30 MG capsule Take 30 mg by mouth every morning.     Historical Provider, MD  DULoxetine (CYMBALTA) 60 MG capsule Take 60 mg by mouth every evening.     Historical Provider, MD  levothyroxine (SYNTHROID, LEVOTHROID) 75 MCG tablet Take 75 mcg by mouth daily before breakfast.  09/06/15   Historical Provider, MD    lisinopril (PRINIVIL,ZESTRIL) 10 MG tablet Take 10 mg by mouth daily. 03/06/16   Historical Provider, MD  meloxicam (MOBIC) 15 MG tablet Take 15 mg by mouth daily.  08/23/15   Historical Provider, MD  omeprazole (PRILOSEC) 20 MG capsule Take 20 mg by mouth 2 (two) times daily before a meal.    Historical Provider, MD  pravastatin (PRAVACHOL) 20 MG tablet Take 20 mg by mouth at bedtime.  09/06/15   Historical Provider, MD  pregabalin (LYRICA) 100 MG capsule Take 1 capsule (100 mg total) by mouth 2 (two) times daily. 09/27/15   Joseph Art, DO  SUMAtriptan (IMITREX) 100 MG tablet Take 100 mg by mouth as needed for headache. Take  at onset of headache, may repeat in 2 hours if needed. Do not exceed 2 doses in 24 hours. 04/03/16   Historical Provider, MD  traZODone (DESYREL) 100 MG tablet Take 300 mg by mouth at bedtime.     Historical Provider, MD  vitamin B-12 (CYANOCOBALAMIN) 1000 MCG tablet Take 1,000 mcg by mouth daily.    Historical Provider, MD    Family History No family history on file.  Social History Social History  Substance Use Topics  . Smoking status: Never Smoker  . Smokeless tobacco: Never Used  . Alcohol use Yes     Comment: social     Allergies   Gabapentin and Cefadroxil   Review of Systems Review of Systems All other systems reviewed and are negative for acute change except as noted in the HPI.  Physical Exam Updated Vital Signs BP 127/74   Pulse 66   Temp 98.9 F (37.2 C) (Oral)   Resp 16   SpO2 97%   Physical Exam  Constitutional: She is oriented to person, place, and time. She appears well-developed and well-nourished. No distress.  Nasal cannula in place  HENT:  Head: Normocephalic and atraumatic.  Nose: Nose normal.  Mouth/Throat: Oropharynx is clear and moist. No oropharyngeal exudate.  Eyes: Conjunctivae and EOM are normal. Pupils are equal, round, and reactive to light. No scleral icterus.  Neck: Normal range of motion. Neck supple. No JVD  present. No tracheal deviation present. No thyromegaly present.  Cardiovascular: Normal rate, regular rhythm and normal heart sounds.  Exam reveals no gallop and no friction rub.   No murmur heard. Pulmonary/Chest: Effort normal and breath sounds normal. No respiratory distress. She has no wheezes. She exhibits no tenderness.  Abdominal: Soft. Bowel sounds are normal. She exhibits no distension and no mass. There is no tenderness. There is no rebound and no guarding.  Musculoskeletal: Normal range of motion. She exhibits deformity. She exhibits no edema or tenderness.  Obvious deformity to right shoulder. Normal pulse and sensation distally.   Lymphadenopathy:    She has no cervical adenopathy.  Neurological: She is alert and oriented to person, place, and time. No cranial nerve deficit. She exhibits normal muscle tone.  Skin: Skin is warm and dry. No rash noted. No erythema. No pallor.  Abrasions to  right elbow and right knee.   Nursing note and vitals reviewed.    ED Treatments / Results  DIAGNOSTIC STUDIES:  Oxygen Saturation is 96% on [place], normal by my interpretation.    COORDINATION OF CARE:  1:48 AM Discussed treatment plan with pt at bedside and pt agreed to plan.  Labs (all labs ordered are listed, but only abnormal results are displayed) Labs Reviewed  CBC WITH DIFFERENTIAL/PLATELET - Abnormal; Notable for the following:       Result Value   Hemoglobin 11.9 (*)    All other components within normal limits  COMPREHENSIVE METABOLIC PANEL - Abnormal; Notable for the following:    Chloride 97 (*)    Glucose, Bld 123 (*)    BUN 21 (*)    Creatinine, Ser 1.05 (*)    Calcium 8.8 (*)    Total Protein 5.8 (*)    ALT 12 (*)    GFR calc non Af Amer 53 (*)    All other components within normal limits  URINE CULTURE  URINALYSIS, ROUTINE W REFLEX MICROSCOPIC  I-STAT TROPOININ, ED    EKG  EKG Interpretation  Date/Time:  Thursday May 24 2016 00:34:08 EDT Ventricular Rate:   63 PR Interval:  140 QRS Duration: 90 QT Interval:  404 QTC Calculation: 413 R Axis:   24 Text Interpretation:  Normal sinus rhythm Low voltage QRS Borderline ECG No significant change since last tracing Confirmed by Erroll Luna [phone]) on 05/24/2016 1:25:05 AM       Radiology Dg Shoulder Right  Result Date: 05/24/2016 CLINICAL DATA:  Status post syncope and fall. Right shoulder pain. Initial encounter. EXAM: RIGHT SHOULDER - 2+ VIEW COMPARISON:  None. FINDINGS: There is anterior-inferior dislocation of the right humeral head, with a large Hill-Sachs lesion. A small osseous Bankart lesion cannot be excluded. No rib fractures are seen. A few degenerative osseous fragments are noted at the expected location of the humeral head. Mild degenerative change is noted at the right acromioclavicular joint. Chronic pleural thickening is suggested along the right lateral chest wall. IMPRESSION: 1. Anterior-inferior dislocation of the right humeral head, with a large Hill-Sachs lesion. Small osseous Bankart lesion cannot be excluded. 2. Chronic pleural thickening suggested along the right lateral chest wall. Electronically Signed   By: Roanna Raider M.D.   On: 05/24/2016 01:45    Procedures Procedures (including critical care time)  Medications Ordered in ED Medications  sodium chloride 0.9 % bolus 1,000 mL (not administered)  acetaminophen (TYLENOL) tablet 1,000 mg (1,000 mg Oral Given 05/24/16 0100)  bupivacaine (MARCAINE) 0.5 % injection 30 mL (30 mLs Infiltration Given 05/24/16 0201)  lidocaine (XYLOCAINE) 1 % (with pres) injection 30 mL (30 mLs Infiltration Given 05/24/16 0200)     Initial Impression / Assessment and Plan / ED Course  I have reviewed the triage vital signs and the nursing notes.  Pertinent labs & imaging results that were available during my care of the patient were reviewed by me and considered in my medical decision making (see chart for details).   Procedural  sedation Performed by: Tomasita Crumble Consent: Verbal consent obtained. Risks and benefits: risks, benefits and alternatives were discussed Required items: required blood products, implants, devices, and special equipment available Patient identity confirmed: arm band and provided demographic data Time out: Immediately prior to procedure a "time out" was called to verify the correct patient, procedure, equipment, support staff and site/side marked as required.  Sedation type: moderate (conscious) sedation NPO time confirmed and considedered  Sedatives: KETAMINE   Physician Time at Bedside:  Vitals: Vital signs were monitored during sedation. Cardiac Monitor, pulse oximeter Patient tolerance: Patient tolerated the procedure well with no immediate complications. Comments: Pt with uneventful recovered. Returned to pre-procedural sedation baseline    Reduction of dislocation Date/Time: 5:00 AM Performed by: Tomasita Crumble Authorized byTomasita Crumble Consent: Verbal consent obtained. Risks and benefits: risks, benefits and alternatives were discussed Consent given by: patient Required items: required blood products, implants, devices, and special equipment available Time out: Immediately prior to procedure a "time out" was called to verify the correct patient, procedure, equipment, support staff and site/side marked as required.  Patient sedated: Ketamine  Vitals: Vital signs were monitored during sedation. Patient tolerance: Patient tolerated the procedure well with no immediate complications. Joint: R shoulder Reduction technique: traction counter-traction with manipulation     NERVE BLOCK Performed by: Tomasita Crumble Consent: Verbal consent obtained. Required items: required blood products, implants, devices, and special equipment available Time out: Immediately prior to procedure a "time out" was called to verify the correct patient, procedure, equipment, support staff and  site/side marked as required.  Indication: R shoulder dislocation Nerve block body site: R shoulder joint  Preparation: Patient was prepped and draped in the usual sterile fashion. Needle gauge: 21 G Location technique: anatomical landmarks  Local anesthetic: lidocaine 1% and bupivicaine 0.5% without epi  Anesthetic total: 10 ml and 10ml  Outcome: pain improved Patient tolerance: Patient tolerated the procedure well with no immediate complications.    Patient presents to the ED for syncopal episode.  Will obtain labs, infectious work up and shoulder xray for possible dislocation.   Labs are unremarkable. EKG does not reveal a cause.  Shoulder xr reveals a dislocation.  She was given shoulder block and will attempt reduction.  Labs  And work up unremarkable. Will admit for syncope evaluation given she lives alone and now has one injured shoulder.  It would be dangerous to send patietn home for possible repeat syncopal episode.     Final Clinical Impressions(s) / ED Diagnoses   Final diagnoses:  None    New Prescriptions New Prescriptions   No medications on file     I personally performed the services described in this documentation, which was scribed in my presence. The recorded information has been reviewed and is accurate.       Tomasita Crumble, MD 05/24/16 [id number]

## 2016-05-24 NOTE — Progress Notes (Signed)
Orthopedic Tech Progress Note Patient Details:  Lindsey Rowe 04-Nov-1945 [id number]  Ortho Devices Type of Ortho Device: Sling immobilizer Ortho Device/Splint Location: rue Ortho Device/Splint Interventions: Ordered, Application Assisted dr with shoulder reduction  Trinna Post 05/24/2016, 4:57 AM

## 2016-05-24 NOTE — ED Notes (Signed)
Weights removed from R arm, pt rolled onto back and repositioned.  Repeat xray ordered

## 2016-05-24 NOTE — H&P (Signed)
History and Physical    Lindsey Rowe:[id number] DOB: 1945-03-08 DOA: 05/24/2016  PCP: Astrid Divine, MD Patient coming from: home  Chief Complaint: syncope  HPI: Lindsey Rowe is a 71 y.o. female with medical history significant of bipolar disorder, GERD, migraines, hyperlipidemia, hypertension, hypothyroidism.   Patient presenting with primary complaint of syncopal episode and right shoulder pain. Patient states she passed out while ambulating through her house. When she awoke her right shoulder was severely painful and out of place. Pain worse with any attempt of movement. Pain is constant and nothing makes it better. With regards to syncopal episode patient denies any presyncopal symptoms such as dizziness, lightheadedness and denies any post syncopal symptoms such as confusion, loss of bowel or bladder function, tongue biting. Patient states that this event occurred very similar to her previous. Patient had been ambulating doing through the mouth for or sometime when she syncopized. Patient states that she was started on metoprolol by her primary care physician approximately 1 week prior to episode. Patient denies any feelings of lightheadedness, lethargy, fatigue since starting metoprolol.    ED Course: objective findings below. Shoulder reduced in ED w/ ketamine for sedation  Review of Systems: As per HPI otherwise 10 point review of systems negative.   Ambulatory Status: limited due to body habitus and deconditioning  Past Medical History:  Diagnosis Date  . Arthritis   . Back pain   . Bipolar 1 disorder (HCC)   . Complication of anesthesia    hard to wake up anesthesia  . Depression   . GERD (gastroesophageal reflux disease)   . Headache(784.0)    migraines and tension headaches  . Hyperlipidemia   . Hypertension   . Hypothyroidism   . Thyroid disease     Past Surgical History:  Procedure Laterality Date  . APPENDECTOMY    . BACK SURGERY    . EYE  SURGERY Bilateral    lens implant  . GASTRIC BYPASS    . LUMBAR LAMINECTOMY/DECOMPRESSION MICRODISCECTOMY Right 07/23/2012   Procedure: LUMBAR LAMINECTOMY/DECOMPRESSION MICRODISCECTOMY 2 LEVELS;  Surgeon: Karn Cassis, MD;  Location: [facility];  Service: Neurosurgery;  Laterality: Right;  Right Lumbar three-four  lumbar four-five Laminectomy/Foraminotomy  . NASAL SINUS SURGERY Bilateral   . TONSILLECTOMY    . WRIST SURGERY      Social History   Social History  . Marital status: Widowed    Spouse name: N/A  . Number of children: N/A  . Years of education: N/A   Occupational History  . Not on file.   Social History Main Topics  . Smoking status: Never Smoker  . Smokeless tobacco: Never Used  . Alcohol use Yes     Comment: social  . Drug use: No  . Sexual activity: Not on file   Other Topics Concern  . Not on file   Social History Narrative  . No narrative on file    Allergies  Allergen Reactions  . Gabapentin Other (See Comments)    Right foot and leg swelled   . Cefadroxil Itching    Ends of hair itched     Family History  Problem Relation Age of Onset  . Heart disease Mother   . Emphysema Father   . Diabetes Sister   . Other Brother     Brain tumor - s/p excision    Prior to Admission medications   Medication Sig Start Date End Date Taking? Authorizing Provider  Aspirin-Salicylamide-Caffeine (BC HEADACHE POWDER PO) Take  1 packet by mouth as needed (headache, pain).   Yes Historical Provider, MD  Cholecalciferol (D3-1000) 1000 units capsule Take 1,000 Units by mouth daily.   Yes Historical Provider, MD  DULoxetine (CYMBALTA) 30 MG capsule Take 30 mg by mouth every morning.    Yes Historical Provider, MD  DULoxetine (CYMBALTA) 60 MG capsule Take 60 mg by mouth every evening.    Yes Historical Provider, MD  levothyroxine (SYNTHROID, LEVOTHROID) 75 MCG tablet Take 75 mcg by mouth daily before breakfast.  09/06/15  Yes Historical Provider, MD  lisinopril  (PRINIVIL,ZESTRIL) 10 MG tablet Take 10 mg by mouth daily. 03/06/16  Yes Historical Provider, MD  meloxicam (MOBIC) 15 MG tablet Take 15 mg by mouth daily.  08/23/15  Yes Historical Provider, MD  metoprolol succinate (TOPROL-XL) 25 MG 24 hr tablet Take 25 mg by mouth daily. 05/16/16  Yes Historical Provider, MD  omeprazole (PRILOSEC) 20 MG capsule Take 20 mg by mouth 2 (two) times daily before a meal.   Yes Historical Provider, MD  pravastatin (PRAVACHOL) 20 MG tablet Take 20 mg by mouth at bedtime.  09/06/15  Yes Historical Provider, MD  pregabalin (LYRICA) 100 MG capsule Take 1 capsule (100 mg total) by mouth 2 (two) times daily. 09/27/15  Yes Joseph Art, DO  SUMAtriptan (IMITREX) 100 MG tablet Take 100 mg by mouth as needed for headache. Take  at onset of headache, may repeat in 2 hours if needed. Do not exceed 2 doses in 24 hours. 04/03/16  Yes Historical Provider, MD  traZODone (DESYREL) 100 MG tablet Take 300 mg by mouth at bedtime.    Yes Historical Provider, MD  vitamin B-12 (CYANOCOBALAMIN) 1000 MCG tablet Take 1,000 mcg by mouth daily.   Yes Historical Provider, MD    Physical Exam: Vitals:   05/24/16 0445 05/24/16 0545 05/24/16 0652 05/24/16 0807  BP: 102/68 115/68 (!) 122/38   Pulse: (!) 143 64 62   Resp: Temp:   97.6 F (36.4 C)   TempSrc:   Oral   SpO2: 98% 93% 98% 98%  Weight:   120.5 kg (265 lb 11.2 oz)   Height:    (1.6 m)      General:  Appears calm and comfortable Eyes:  PERRL, EOMI, normal lids, iris ENT:  grossly normal hearing, lips & tongue, mmm Neck:  no LAD, masses or thyromegaly Cardiovascular:  RRR, II/VI systolic murmur. No LE edema.  Respiratory:  CTA bilaterally, no w/r/r. Normal respiratory effort. Abdomen:  soft, ntnd, NABS Skin:  no rash or induration seen on limited exam Musculoskeletal: Right arm in sling with good positioning. No other bony abnormalities or extremity restriction is noted. Left upper extremity and bilateral lower  extremities with good tone and range of motion Psychiatric:  grossly normal mood and affect, speech fluent and appropriate, AOx3 Neurologic:  CN 2-12 grossly intact, moves all extremities in coordinated fashion, sensation intact  Labs on Admission: I have personally reviewed following labs and imaging studies  CBC:  Recent Labs Lab 05/24/16 0055  WBC 10.4  NEUTROABS 7.5  HGB 11.9*  HCT 38.2  MCV 94.8  PLT 223   Basic Metabolic Panel:  Recent Labs Lab 05/24/16 0055  NA 137  K 4.3  CL 97*  CO2 30  GLUCOSE 123*  BUN 21*  CREATININE 1.05*  CALCIUM 8.8*   GFR: Estimated Creatinine Clearance: 63.5 mL/min (A) (by C-G formula based on SCr of 1.05 mg/dL (H)). Liver Function  Tests:  Recent Labs Lab 05/24/16 0055  AST 21  ALT 12*  ALKPHOS 82  BILITOT 0.5  PROT 5.8*  ALBUMIN 3.6   No results for input(s): LIPASE, AMYLASE in the last 168 hours. No results for input(s): AMMONIA in the last 168 hours. Coagulation Profile: No results for input(s): INR, PROTIME in the last 168 hours. Cardiac Enzymes: No results for input(s): CKTOTAL, CKMB, CKMBINDEX, TROPONINI in the last 168 hours. BNP (last 3 results) No results for input(s): PROBNP in the last 8760 hours. HbA1C: No results for input(s): HGBA1C in the last 72 hours. CBG: No results for input(s): GLUCAP in the last 168 hours. Lipid Profile: No results for input(s): CHOL, HDL, LDLCALC, TRIG, CHOLHDL, LDLDIRECT in the last 72 hours. Thyroid Function Tests: No results for input(s): TSH, T4TOTAL, FREET4, T3FREE, THYROIDAB in the last 72 hours. Anemia Panel: No results for input(s): VITAMINB12, FOLATE, FERRITIN, TIBC, IRON, RETICCTPCT in the last 72 hours. Urine analysis:    Component Value Date/Time   COLORURINE YELLOW 05/24/2016 0900   APPEARANCEUR CLEAR 05/24/2016 0900   LABSPEC 1.015 05/24/2016 0900   PHURINE 5.0 05/24/2016 0900   GLUCOSEU NEGATIVE 05/24/2016 0900   HGBUR NEGATIVE 05/24/2016 0900   HGBUR  trace-lysed 09/23/2009 0000   BILIRUBINUR NEGATIVE 05/24/2016 0900   KETONESUR NEGATIVE 05/24/2016 0900   PROTEINUR NEGATIVE 05/24/2016 0900   UROBILINOGEN 1.0 08/13/2012 1757   NITRITE NEGATIVE 05/24/2016 0900   LEUKOCYTESUR NEGATIVE 05/24/2016 0900    Creatinine Clearance: Estimated Creatinine Clearance: 63.5 mL/min (A) (by C-G formula based on SCr of 1.05 mg/dL (H)).  Sepsis Labs: (procalcitonin:4,lacticidven:4) )No results found for this or any previous visit (from the past 240 hour(s)).   Radiological Exams on Admission: Dg Shoulder Right  Result Date: 05/24/2016 CLINICAL DATA:  Status post syncope and fall. Right shoulder pain. Initial encounter. EXAM: RIGHT SHOULDER - 2+ VIEW COMPARISON:  None. FINDINGS: There is anterior-inferior dislocation of the right humeral head, with a large Hill-Sachs lesion. A small osseous Bankart lesion cannot be excluded. No rib fractures are seen. A few degenerative osseous fragments are noted at the expected location of the humeral head. Mild degenerative change is noted at the right acromioclavicular joint. Chronic pleural thickening is suggested along the right lateral chest wall. IMPRESSION: 1. Anterior-inferior dislocation of the right humeral head, with a large Hill-Sachs lesion. Small osseous Bankart lesion cannot be excluded. 2. Chronic pleural thickening suggested along the right lateral chest wall. Electronically Signed   By: Roanna Raider M.D.   On: 05/24/2016 01:45   Dg Chest Port 1 View  Result Date: 05/24/2016 CLINICAL DATA:  Syncope tonight. Right shoulder dislocation and relocation. EXAM: PORTABLE CHEST 1 VIEW COMPARISON:  04/10/2016 FINDINGS: Shallow inspiration with elevation of the right hemidiaphragm. Lungs are grossly clear and expanded. No focal consolidation. No blunting of costophrenic angles. No pneumothorax. Normal heart size and pulmonary vascularity. Incidental note of a large Hill-Sachs deformity in the right proximal  humerus. IMPRESSION: Shallow inspiration.  No evidence of active pulmonary disease. Electronically Signed   By: Burman Nieves M.D.   On: 05/24/2016 04:43   Dg Shoulder Right Port  Result Date: 05/24/2016 CLINICAL DATA:  Postreduction right shoulder EXAM: PORTABLE RIGHT SHOULDER COMPARISON:  05/24/2016 FINDINGS: Interval relocation of the right shoulder with appropriate position of the humeral head with respect to the glenoid demonstrated on the included views. There is a large lateral deformity of the humeral head consistent with a large Hill-Sachs fracture. Loss of subacromial space suggesting chronic  rotator cuff arthropathy. Degenerative changes in the acromioclavicular joint. IMPRESSION: Relocation of the right shoulder with large Hill-Sachs fracture deformity. Chronic rotator cuff arthropathy. Electronically Signed   By: Burman Nieves M.D.   On: 05/24/2016 04:45   Dg Shoulder Right Port  Result Date: 05/24/2016 CLINICAL DATA:  Postreduction right shoulder EXAM: PORTABLE RIGHT SHOULDER COMPARISON:  05/24/2016 FINDINGS: Technically limited examination due to exposure and overlying lead wires. There is persistent anterior dislocation of the right shoulder with apparent impaction of the posterior humeral head on the glenoid. Small ununited ossicles may represent fracture fragments. IMPRESSION: Persistent anterior dislocation of the right shoulder. Electronically Signed   By: Burman Nieves M.D.   On: 05/24/2016 03:36    EKG: Independently reviewed. Sinus. No ACS. No significant abnormality  Assessment/Plan Active Problems:   BIPOLAR AFFECTIVE DISORDER   HYPERTENSION, BENIGN ESSENTIAL   Migraine   Hypotension   Hypothyroid   Syncope   Shoulder dislocation, right, initial encounter   Hill Sachs deformity, right   Hyperglycemia   Anterior dislocation of right shoulder   Syncope: suspect from labile BPs as opposed to primary primary adrenal insufficiency or mineralocorticoid deficiency  as previously thought. Pt started metoprolol 1 week prior to current episode. Arrhythmia cannot be excluded though EKG reasurring and no events reported on tele. Last Echo w/ G2 diastolic dysfunction but nml EF and no aortic stenosis. Orthostatic VS unremarkable though pt had received 1L bolus and was well beyond when she should have had her morning medications. - Tele - Hold Metoprolol (consider DC), and lisinopril - repeat orthostatic VS in pm and am - AM ACTH level for completeness - recommend outpt halter monitor - consider florinef if pressure remains soft and continue to suspect adrenal insufficiency. This was documented in last DC summary as having been prescribed but pt denies ever starting the medication.   Shoulder dislocation/Fracture: successfully reduced in ED. Neurovascularly intact. Large Hill-sachs fracture/deformity noted. Pt seen in past by [facility] ortho. - continue sling  - f/u w/ [place] [facility]. Called and appt scheduled for 05/31/16 however time it TBD and Ortho office to call pt w/ the time of the appt. - toradol, robaxin.  GERD: - continei ppi  HLD: - continue statin  Hyperglycemia: 123 on admission - A1c - CBG AC  Bipolar/Insomnia: - continue cymbalta and trazodone  MSK/Neuropathic pain: - continue lyrica  - toradol as above in place of mobic   Migraines: currently w/o migraine - continue imitrex prn   DVT prophylaxis: lovenox  Code Status: FULL  Family Communication: none  Disposition Plan: pending improvement  Consults called: None  Admission status: observation    Niobe Dick J MD [facility]  If 7PM-7AM, please contact night-coverage [email or web address] Password [facility]  05/24/2016, 10:43 AM

## 2016-05-24 NOTE — Care Management Obs Status (Signed)
MEDICARE OBSERVATION STATUS NOTIFICATION   Patient Details  Name: Lindsey Rowe MRN: [id number] Date of Birth: 1945/12/10   Medicare Observation Status Notification Given:  Yes    Darrold Span, RN 05/24/2016, 4:05 PM

## 2016-05-25 ENCOUNTER — Encounter (HOSPITAL_COMMUNITY): Payer: Self-pay | Admitting: Physician Assistant

## 2016-05-25 ENCOUNTER — Encounter (HOSPITAL_COMMUNITY)
Admission: EM | Disposition: A | Discharge status: Home / Self Care | Payer: Self-pay | Source: Home / Self Care | Attending: Emergency Medicine

## 2016-05-25 DIAGNOSIS — S43014A Anterior dislocation of right humerus, initial encounter: Secondary | ICD-10-CM | POA: Diagnosis not present

## 2016-05-25 DIAGNOSIS — F310 Bipolar disorder, current episode hypomanic: Secondary | ICD-10-CM

## 2016-05-25 DIAGNOSIS — R55 Syncope and collapse: Secondary | ICD-10-CM | POA: Diagnosis not present

## 2016-05-25 DIAGNOSIS — G43909 Migraine, unspecified, not intractable, without status migrainosus: Secondary | ICD-10-CM | POA: Diagnosis not present

## 2016-05-25 DIAGNOSIS — E032 Hypothyroidism due to medicaments and other exogenous substances: Secondary | ICD-10-CM

## 2016-05-25 DIAGNOSIS — K219 Gastro-esophageal reflux disease without esophagitis: Secondary | ICD-10-CM | POA: Diagnosis not present

## 2016-05-25 DIAGNOSIS — I10 Essential (primary) hypertension: Secondary | ICD-10-CM | POA: Diagnosis not present

## 2016-05-25 DIAGNOSIS — E039 Hypothyroidism, unspecified: Secondary | ICD-10-CM | POA: Diagnosis not present

## 2016-05-25 DIAGNOSIS — Z6841 Body Mass Index (BMI) 40.0 and over, adult: Secondary | ICD-10-CM | POA: Diagnosis not present

## 2016-05-25 DIAGNOSIS — M797 Fibromyalgia: Secondary | ICD-10-CM | POA: Diagnosis not present

## 2016-05-25 DIAGNOSIS — E271 Primary adrenocortical insufficiency: Secondary | ICD-10-CM | POA: Diagnosis not present

## 2016-05-25 DIAGNOSIS — I9589 Other hypotension: Secondary | ICD-10-CM | POA: Diagnosis not present

## 2016-05-25 DIAGNOSIS — E785 Hyperlipidemia, unspecified: Secondary | ICD-10-CM | POA: Diagnosis not present

## 2016-05-25 DIAGNOSIS — F319 Bipolar disorder, unspecified: Secondary | ICD-10-CM | POA: Diagnosis not present

## 2016-05-25 HISTORY — PX: LOOP RECORDER INSERTION: EP1214

## 2016-05-25 LAB — URINE CULTURE: Culture: <10000 — AB

## 2016-05-25 LAB — BASIC METABOLIC PANEL
Anion gap: 6 (ref 5–15)
BUN: 20 mg/dL (ref 6–20)
CO2: 35 mmol/L — ABNORMAL HIGH (ref 22–32)
Calcium: 8.4 mg/dL — ABNORMAL LOW (ref 8.9–10.3)
Chloride: 99 mmol/L — ABNORMAL LOW (ref 101–111)
Creatinine, Ser: 0.9 mg/dL (ref 0.44–1.00)
GFR calc Af Amer: >60 mL/min (ref >60)
GFR calc non Af Amer: >60 mL/min (ref >60)
Glucose, Bld: 99 mg/dL (ref 65–99)
Potassium: 4.3 mmol/L (ref 3.5–5.1)
Sodium: 140 mmol/L (ref 135–145)

## 2016-05-25 LAB — CBC
HCT: 35.3 % — ABNORMAL LOW (ref 36.0–46.0)
Hemoglobin: 10.7 g/dL — ABNORMAL LOW (ref 12.0–15.0)
MCH: 29.2 pg (ref 26.0–34.0)
MCHC: 30.3 g/dL (ref 30.0–36.0)
MCV: 96.2 fL (ref 78.0–100.0)
Platelets: 172 10*3/uL (ref 150–400)
RBC: 3.67 MIL/uL — ABNORMAL LOW (ref 3.87–5.11)
RDW: 15.4 % (ref 11.5–15.5)
WBC: 8.1 10*3/uL (ref 4.0–10.5)

## 2016-05-25 LAB — GLUCOSE, CAPILLARY: Glucose-Capillary: 96 mg/dL (ref 65–99)

## 2016-05-25 LAB — HEMOGLOBIN A1C
Hgb A1c MFr Bld: 5.2 % (ref 4.8–5.6)
Mean Plasma Glucose: 103 mg/dL

## 2016-05-25 SURGERY — LOOP RECORDER INSERTION
Anesthesia: LOCAL

## 2016-05-25 MED ORDER — LIDOCAINE HCL 1 % IJ SOLN
INTRAMUSCULAR | Status: AC
  Filled 2016-05-25: qty 20

## 2016-05-25 MED ORDER — FLUDROCORTISONE ACETATE 0.1 MG PO TABS
0.1000 mg | ORAL_TABLET | Freq: Two times a day (BID) | ORAL | Status: DC
  Administered 2016-05-25 – 2016-05-26 (×2): 0.1 mg via ORAL
  Filled 2016-05-25 (×4): qty 1

## 2016-05-25 MED ORDER — LIDOCAINE HCL (PF) 1 % IJ SOLN
INTRAMUSCULAR | Status: AC
  Filled 2016-05-25: qty 30

## 2016-05-25 MED ORDER — LIDOCAINE HCL (PF) 1 % IJ SOLN
INTRAMUSCULAR | Status: DC | PRN
  Administered 2016-05-25: 20 mL via INTRADERMAL

## 2016-05-25 SURGICAL SUPPLY — 2 items
LOOP REVEAL LINQSYS (Prosthesis & Implant Heart) ×1 IMPLANT
PACK LOOP INSERTION (CUSTOM PROCEDURE TRAY) ×2 IMPLANT

## 2016-05-25 NOTE — Progress Notes (Addendum)
[facility] PROGRESS NOTE  Lindsey Rowe:[id number] DOB: Jun 19, 1945 DOA: 05/24/2016   PCP: Astrid Divine, MD     Assessment/Plan: Active Problems:   BIPOLAR AFFECTIVE DISORDER   HYPERTENSION, BENIGN ESSENTIAL   Migraine   Hypotension   Hypothyroid   Syncope   Shoulder dislocation, right, initial encounter   Hill Sachs deformity, right   Hyperglycemia   Anterior dislocation of right shoulder   71 y.o. female with medical history significant of bipolar disorder, GERD, migraines, hyperlipidemia, hypertension, hypothyroidism who presents with recurrent syncope, right shoulder dislocation, similar admission in February 2018.[name] There are no preceding symptoms prior to her syncope. Recently started on metoprolol for 4.2 cm fusiform ascending aortic aneurysm   Assessment and plan Syncope: suspect from labile BPs , previously thought to have primary adrenal insufficiency or mineralocorticoid deficiency  Recently placed on metoprolol 1 week prior to current episode.  Arrhythmia cannot be excluded though EKG reasurring and no events reported on tele. Last Echo w/ G2 diastolic dysfunction but nml EF and no aortic stenosis. Orthostatic VS unremarkable  - Tele unremarkable Continue to check orthostatics Check a.m. cortisol Cardiology consultation for ischemia workup,  holter monitor vs ILR , patient has decided to proceed with ILR .Patient should not drive for 6 months per [place] [employer] law Aldosterone level 3.3 on previous admission, a.m. cortisol only 10.7 Will restart Florinef [apparently her PCP had stopped this after discharge] Defer further workup to cardiology    Dislocated right shoulder Apparently orthopedic service was contacted by EDP appt scheduled for 05/31/16 with [facility]   however time it TBD and Ortho office to call pt w/ the time of the appt. - toradol, robaxin.  GERD: - continei ppi  HLD: - continue statin  Hyperglycemia: 123 on  admission - A1c 5.2  - CBG AC  Bipolar/Insomnia: - continue cymbalta and trazodone  MSK/Neuropathic pain: - continue lyrica  - toradol as above in place of mobic   Migraines: currently w/o migraine - continue imitrex prn    DVT prophylaxsis Lovenox  Code Status:  Full code     Family Communication: Discussed in detail with the patient, all imaging results, lab results explained to the patient   Disposition Plan: Cardiology consult      Consultants:  Cardiology  Procedures:  None  Antibiotics: Anti-infectives    None         HPI/Subjective: No cp, no sob , no arrhythmias   Objective: Vitals:   05/24/16 0807 05/24/16 1624 05/24/16 2014 05/25/16 0439  BP:  113/62 122/69 106/68  Pulse:  81 77 87  Resp:  Temp:  98.3 F (36.8 C) 97.4 F (36.3 C) 98.2 F (36.8 C)  TempSrc:  Oral Oral Oral  SpO2: 98% 97% 97% 97%  Weight:    120.5 kg (265 lb 9.6 oz)  Height:        Intake/Output Summary (Last 24 hours) at 05/25/16 0946 Last data filed at 05/24/16 1800  Gross per 24 hour  Intake              480 ml  Output                0 ml  Net              480 ml    Exam:  Examination:  General exam: Appears calm and comfortable  Respiratory system: Clear to auscultation. Respiratory effort normal. Cardiovascular system: S1 & S2  heard, RRR. No JVD, murmurs, rubs, gallops or clicks. No pedal edema. Gastrointestinal system: Abdomen is nondistended, soft and nontender. No organomegaly or masses felt. Normal bowel sounds heard. Central nervous system: Alert and oriented. No focal neurological deficits. Extremities: Symmetric 5 x 5 power. Skin: No rashes, lesions or ulcers Psychiatry: Judgement and insight appear normal. Mood & affect appropriate.     Data Reviewed: I have personally reviewed following labs and imaging studies  Micro Results No results found for this or any previous visit (from the past 240 hour(s)).  Radiology  Reports Dg Shoulder Right  Result Date: 05/24/2016 CLINICAL DATA:  Status post syncope and fall. Right shoulder pain. Initial encounter. EXAM: RIGHT SHOULDER - 2+ VIEW COMPARISON:  None. FINDINGS: There is anterior-inferior dislocation of the right humeral head, with a large Hill-Sachs lesion. A small osseous Bankart lesion cannot be excluded. No rib fractures are seen. A few degenerative osseous fragments are noted at the expected location of the humeral head. Mild degenerative change is noted at the right acromioclavicular joint. Chronic pleural thickening is suggested along the right lateral chest wall. IMPRESSION: 1. Anterior-inferior dislocation of the right humeral head, with a large Hill-Sachs lesion. Small osseous Bankart lesion cannot be excluded. 2. Chronic pleural thickening suggested along the right lateral chest wall. Electronically Signed   By: Roanna Raider M.D.   On: 05/24/2016 01:45   Dg Chest Port 1 View  Result Date: 05/24/2016 CLINICAL DATA:  Syncope tonight. Right shoulder dislocation and relocation. EXAM: PORTABLE CHEST 1 VIEW COMPARISON:  04/10/2016 FINDINGS: Shallow inspiration with elevation of the right hemidiaphragm. Lungs are grossly clear and expanded. No focal consolidation. No blunting of costophrenic angles. No pneumothorax. Normal heart size and pulmonary vascularity. Incidental note of a large Hill-Sachs deformity in the right proximal humerus. IMPRESSION: Shallow inspiration.  No evidence of active pulmonary disease. Electronically Signed   By: Burman Nieves M.D.   On: 05/24/2016 04:43   Dg Shoulder Right Port  Result Date: 05/24/2016 CLINICAL DATA:  Postreduction right shoulder EXAM: PORTABLE RIGHT SHOULDER COMPARISON:  05/24/2016 FINDINGS: Interval relocation of the right shoulder with appropriate position of the humeral head with respect to the glenoid demonstrated on the included views. There is a large lateral deformity of the humeral head consistent with a  large Hill-Sachs fracture. Loss of subacromial space suggesting chronic rotator cuff arthropathy. Degenerative changes in the acromioclavicular joint. IMPRESSION: Relocation of the right shoulder with large Hill-Sachs fracture deformity. Chronic rotator cuff arthropathy. Electronically Signed   By: Burman Nieves M.D.   On: 05/24/2016 04:45   Dg Shoulder Right Port  Result Date: 05/24/2016 CLINICAL DATA:  Postreduction right shoulder EXAM: PORTABLE RIGHT SHOULDER COMPARISON:  05/24/2016 FINDINGS: Technically limited examination due to exposure and overlying lead wires. There is persistent anterior dislocation of the right shoulder with apparent impaction of the posterior humeral head on the glenoid. Small ununited ossicles may represent fracture fragments. IMPRESSION: Persistent anterior dislocation of the right shoulder. Electronically Signed   By: Burman Nieves M.D.   On: 05/24/2016 03:36     CBC  Recent Labs Lab 05/24/16 0055 05/25/16 0704  WBC 10.4 8.1  HGB 11.9* 10.7*  HCT 38.2 35.3*  PLT 223 172  MCV 94.8 96.2  MCH 29.5 29.2  MCHC 31.2 30.3  RDW 15.1 15.4  LYMPHSABS 2.0  --   MONOABS 0.8  --   EOSABS 0.1  --   BASOSABS 0.0  --     Chemistries   Recent Labs  Lab 05/24/16 0055 05/25/16 0704  NA 137 140  K 4.3 4.3  CL 97* 99*  CO2 30 35*  GLUCOSE 123* 99  BUN 21* 20  CREATININE 1.05* 0.90  CALCIUM 8.8* 8.4*  AST 21  --   ALT 12*  --   ALKPHOS 82  --   BILITOT 0.5  --    ------------------------------------------------------------------------------------------------------------------ estimated creatinine clearance is 74.1 mL/min (by C-G formula based on SCr of 0.9 mg/dL). ------------------------------------------------------------------------------------------------------------------  Recent Labs  05/24/16 1142  HGBA1C 5.2   ------------------------------------------------------------------------------------------------------------------ No results for  input(s): CHOL, HDL, LDLCALC, TRIG, CHOLHDL, LDLDIRECT in the last 72 hours. ------------------------------------------------------------------------------------------------------------------ No results for input(s): TSH, T4TOTAL, T3FREE, THYROIDAB in the last 72 hours.  Invalid input(s): FREET3 ------------------------------------------------------------------------------------------------------------------ No results for input(s): VITAMINB12, FOLATE, FERRITIN, TIBC, IRON, RETICCTPCT in the last 72 hours.  Coagulation profile No results for input(s): INR, PROTIME in the last 168 hours.  No results for input(s): DDIMER in the last 72 hours.  Cardiac Enzymes  Recent Labs Lab 05/24/16 1142  TROPONINI <0.03   ------------------------------------------------------------------------------------------------------------------ Invalid input(s): POCBNP   CBG:  Recent Labs Lab 05/25/16 0609  GLUCAP 96       Studies: Dg Shoulder Right  Result Date: 05/24/2016 CLINICAL DATA:  Status post syncope and fall. Right shoulder pain. Initial encounter. EXAM: RIGHT SHOULDER - 2+ VIEW COMPARISON:  None. FINDINGS: There is anterior-inferior dislocation of the right humeral head, with a large Hill-Sachs lesion. A small osseous Bankart lesion cannot be excluded. No rib fractures are seen. A few degenerative osseous fragments are noted at the expected location of the humeral head. Mild degenerative change is noted at the right acromioclavicular joint. Chronic pleural thickening is suggested along the right lateral chest wall. IMPRESSION: 1. Anterior-inferior dislocation of the right humeral head, with a large Hill-Sachs lesion. Small osseous Bankart lesion cannot be excluded. 2. Chronic pleural thickening suggested along the right lateral chest wall. Electronically Signed   By: Roanna Raider M.D.   On: 05/24/2016 01:45   Dg Chest Port 1 View  Result Date: 05/24/2016 CLINICAL DATA:  Syncope tonight.  Right shoulder dislocation and relocation. EXAM: PORTABLE CHEST 1 VIEW COMPARISON:  04/10/2016 FINDINGS: Shallow inspiration with elevation of the right hemidiaphragm. Lungs are grossly clear and expanded. No focal consolidation. No blunting of costophrenic angles. No pneumothorax. Normal heart size and pulmonary vascularity. Incidental note of a large Hill-Sachs deformity in the right proximal humerus. IMPRESSION: Shallow inspiration.  No evidence of active pulmonary disease. Electronically Signed   By: Burman Nieves M.D.   On: 05/24/2016 04:43   Dg Shoulder Right Port  Result Date: 05/24/2016 CLINICAL DATA:  Postreduction right shoulder EXAM: PORTABLE RIGHT SHOULDER COMPARISON:  05/24/2016 FINDINGS: Interval relocation of the right shoulder with appropriate position of the humeral head with respect to the glenoid demonstrated on the included views. There is a large lateral deformity of the humeral head consistent with a large Hill-Sachs fracture. Loss of subacromial space suggesting chronic rotator cuff arthropathy. Degenerative changes in the acromioclavicular joint. IMPRESSION: Relocation of the right shoulder with large Hill-Sachs fracture deformity. Chronic rotator cuff arthropathy. Electronically Signed   By: Burman Nieves M.D.   On: 05/24/2016 04:45   Dg Shoulder Right Port  Result Date: 05/24/2016 CLINICAL DATA:  Postreduction right shoulder EXAM: PORTABLE RIGHT SHOULDER COMPARISON:  05/24/2016 FINDINGS: Technically limited examination due to exposure and overlying lead wires. There is persistent anterior dislocation of the right shoulder with apparent impaction of the posterior humeral head on the  glenoid. Small ununited ossicles may represent fracture fragments. IMPRESSION: Persistent anterior dislocation of the right shoulder. Electronically Signed   By: Burman Nieves M.D.   On: 05/24/2016 03:36      Lab Results  Component Value Date   HGBA1C 5.2 05/24/2016   Lab Results   Component Value Date   MICROALBUR 0.84 09/23/2009   LDLCALC 120 (H) 11/22/2009   CREATININE 0.90 05/25/2016       Scheduled Meds: . DULoxetine  30 mg Oral Daily  . DULoxetine  60 mg Oral QPM  . enoxaparin (LOVENOX) injection  40 mg Subcutaneous Q24H  . levothyroxine  75 mcg Oral QAC breakfast  . meloxicam  15 mg Oral Daily  . pantoprazole  40 mg Oral Daily  . pravastatin  20 mg Oral QHS  . pregabalin  100 mg Oral BID  . sodium chloride flush  3 mL Intravenous Q12H  . traZODone  300 mg Oral QHS   Continuous Infusions:   LOS: 0 days    Time spent: >30 MINS    Richarda Overlie  [facility] Pager [phone]. If 7PM-7AM, please contact night-coverage at [email or web address], password [facility] 05/25/2016, 9:46 AM  LOS: 0 days

## 2016-05-25 NOTE — Evaluation (Signed)
Physical Therapy Evaluation Patient Details Name: Lindsey Rowe MRN: [id number] DOB: Aug 20, 1945 Today's Date: 05/25/2016   History of Present Illness  Pt is a 71 y/o female admitted from home secondary to experiencing a syncopal episode in which she fell and dislocated her R shoulder. Pt's shoulder was reduced in the ER and sling applied. Of note, pt recently admitted 2/17 and d/d'd on 2/19 secondary to syncopal episode. PMH including but not limited to bipolar disorder, depression, HTN, HLD and hypothyroidism.  Clinical Impression  Pt presented supine in bed with HOB elevated, awake and willing to participate in therapy session. Prior to admission, pt reported that she was mod I with functional mobility, using a RW to ambulate and independent with ADLs. Pt lives alone and has friends that are available to assist PRN but she has been unable to call them to confirm this since admission. Pt currently requires min guard for safety with transfers and min A to ambulate with one person HHA. Pt very unsteady with ambulation and is at a very high risk of falls with three prior falls in the last six months. PT currently recommending pt d/c to SNF for ST rehab prior to returning home. Pt would continue to benefit from skilled physical therapy services at this time while admitted and after d/c to address the below listed limitations in order to improve overall safety and independence with functional mobility.     Follow Up Recommendations SNF;Supervision/Assistance - 24 hour;Other (comment) (if pt refuses, will need 24/7 supervision and HHPT, HHOT)    Equipment Recommendations  None recommended by PT;Other (comment) (pt has all necessary DME at home)    Recommendations for Other Services       Precautions / Restrictions Precautions Precautions: Fall Precaution Comments: pt reports three falls in the last six months, two of which she attributes to syncope and one she states that her knees "just gave  out" Restrictions Weight Bearing Restrictions: No Other Position/Activity Restrictions: "continue with R UE sling" per H&P      Mobility  Bed Mobility Overal bed mobility: Needs Assistance Bed Mobility: Supine to Sit     Supine to sit: Min guard     General bed mobility comments: increased time and effort, heavy use of bed rail with L UE, min guard for safety  Transfers Overall transfer level: Needs assistance Equipment used: None;1 person hand held assist Transfers: Sit to/from Stand Sit to Stand: Min guard         General transfer comment: min guard for safety with rise from bed x1 and from toilet x1  Ambulation/Gait Ambulation/Gait assistance: Min assist Ambulation Distance (Feet): 20 Feet Assistive device: 1 person hand held assist Gait Pattern/deviations: Step-through pattern;Decreased step length - right;Decreased step length - left;Shuffle;Drifts right/left;Wide base of support Gait velocity: decreased Gait velocity interpretation: Below normal speed for age/gender General Gait Details: modest instability with ambulation requiring constant min A for safety and stability.   Stairs            Wheelchair Mobility    Modified Rankin (Stroke Patients Only)       Balance Overall balance assessment: Needs assistance;History of Falls Sitting-balance support: Feet supported Sitting balance-Leahy Scale: Good     Standing balance support: During functional activity;No upper extremity supported Standing balance-Leahy Scale: Fair                               Pertinent Vitals/Pain Pain Assessment:  Faces Faces Pain Scale: Hurts whole lot Pain Location: R shoulder Pain Descriptors / Indicators: Sharp;Sore;Grimacing;Guarding Pain Intervention(s): Monitored during session;Repositioned    Home Living Family/patient expects to be discharged to:: Private residence Living Arrangements: Alone Available Help at Discharge: Friend(s);Available  PRN/intermittently Type of Home: Apartment Home Access: Level entry     Home Layout: One level Home Equipment: Walker - 2 wheels;Cane - single point;Shower seat;Walker - 4 wheels Additional Comments: pt reports that she uses [employer] for transportation. She states that she has a friend that is also an aide and could be available PRN.    Prior Function Level of Independence: Independent with assistive device(s)         Comments: pt reported that she mostly uses her RW for ambulation and was independent with ADLs, cooking and cleaning     Hand Dominance   Dominant Hand: Right    Extremity/Trunk Assessment   Upper Extremity Assessment Upper Extremity Assessment: RUE deficits/detail RUE Deficits / Details: pt with R UE in sling and very painful with mobility RUE: Unable to fully assess due to pain;Unable to fully assess due to immobilization    Lower Extremity Assessment Lower Extremity Assessment: Overall WFL for tasks assessed    Cervical / Trunk Assessment Cervical / Trunk Assessment: Kyphotic  Communication   Communication: No difficulties  Cognition Arousal/Alertness: Awake/alert Behavior During Therapy: WFL for tasks assessed/performed Overall Cognitive Status: Impaired/Different from baseline Area of Impairment: Safety/judgement                         Safety/Judgement: Decreased awareness of safety;Decreased awareness of deficits            General Comments      Exercises     Assessment/Plan    PT Assessment Patient needs continued PT services  PT Problem List Decreased strength;Decreased activity tolerance;Decreased balance;Decreased mobility;Decreased coordination;Decreased knowledge of use of DME;Decreased safety awareness;Decreased knowledge of precautions;Cardiopulmonary status limiting activity;Pain       PT Treatment Interventions DME instruction;Gait training;Stair training;Functional mobility training;Therapeutic activities;Therapeutic  exercise;Balance training;Neuromuscular re-education;Patient/family education    PT Goals (Current goals can be found in the Care Plan section)  Acute Rehab PT Goals Patient Stated Goal: return home soon PT Goal Formulation: With patient Time For Goal Achievement: 06/08/16 Potential to Achieve Goals: Fair    Frequency Min 3X/week   Barriers to discharge Decreased caregiver support      Co-evaluation               End of Session Equipment Utilized During Treatment: Gait belt (R UE sling) Activity Tolerance: Patient limited by pain Patient left: in bed;with call bell/phone within reach Nurse Communication: Mobility status;Patient requests pain meds PT Visit Diagnosis: Other abnormalities of gait and mobility (R26.89);Repeated falls (R29.6);Pain Pain - Right/Left: Right Pain - part of body: Shoulder    Time: 1450-1521 PT Time Calculation (min) (ACUTE ONLY): 31 min   Charges:   PT Evaluation $PT Eval Moderate Complexity: 1 Procedure PT Treatments $Gait Training: 8-22 mins   PT G Codes:   PT G-Codes **NOT FOR INPATIENT CLASS** Functional Assessment Tool Used: AM-PAC 6 Clicks Basic Mobility;Clinical judgement Functional Limitation: Mobility: Walking and moving around Mobility: Walking and Moving Around Current Status (Z6109): At least 40 percent but less than 60 percent impaired, limited or restricted Mobility: Walking and Moving Around Goal Status [phone]): At least 1 percent but less than 20 percent impaired, limited or restricted    Terralyn Chalk, PT, DPT  [phone]   Alessandra Bevels Bralyn Folkert 05/25/2016, 3:50 PM
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
## 2016-05-25 NOTE — Discharge Instructions (Signed)
Keep incision clean and dry for 3 days. °You can remove outer dressing tomorrow. °Leave steri-strips (little pieces of tape) on until seen in the office for wound check appointment. °Call the office ([phone]) for redness, drainage, swelling, or fever. ° °

## 2016-05-25 NOTE — Consult Note (Signed)
CARDIOLOGY CONSULT NOTE   Patient ID: Lindsey Rowe MRN: [id number] DOB/AGE: [birth year] 71 y.o.  Admit date: 05/24/2016  Primary Physician   Astrid Divine, MD Primary Cardiologist   New Reason for Consultation   Syncope  Requesting MD: Dr Susie Cassette  HPI: Lindsey Rowe is a 71 y.o. female with hx of bipolar disorder, GERD, migraines, hyperlipidemia, hypertension, hypothyroidism, 4.2 cm fusiform Asc Ao aneurysm (Dr Laneta Simmers has seen), fibromyalgia  Pt is being seen today for the evaluation of syncope at the request of Dr Susie Cassette.  Pt stated she was in her usual state of health Weds. She had a good day. PO intake good. No recent illnesses or problems.   She got up off the sofa and started toward the kitchen to turn the light off. She woke up in the dining room and R arm was very painful. No prodrome that she remembers. Has never had palpitations, heart skips, etc.   She had no incontinence of bowel or bladder. She was alone. When she woke up, she knew immediately who/where she was. She was too weak to get up since she could not use her R arm (would need both arms at baseline). EMS tx to hospital, no reports of bradycardia or hypoxia. R shoulder dislocation>> 5 lb wt tied to arm while hanging down, dislocation resolved. Pt became hypoxic when sedated w/ O2 sats 64%, no O2 needed now.   She does not have good balance, falls occasionally because of that. Last time was a 04/10/2016. These falls are not 2nd presyncope/syncope. This is the most serious injury she has ever had from a fall.   Admit 02/16-02/19/2018 for syncope, happened after she had been standing for a while frying food. She woke up on the floor. Prior to syncope, she had generalized weakness, HA, poor appetite for a while. O2 sats initially low but improved. No PE on CT. BP low on admission at 80s-90s, improved by d/c.  Past Medical History:  Diagnosis Date  . Arthritis   . Back pain   . Bipolar 1 disorder (HCC)    . Complication of anesthesia    hard to wake up anesthesia  . Depression   . GERD (gastroesophageal reflux disease)   . Headache(784.0)    migraines and tension headaches  . Hyperlipidemia   . Hypertension   . Hypothyroidism   . Thyroid disease      Past Surgical History:  Procedure Laterality Date  . APPENDECTOMY    . BACK SURGERY    . EYE SURGERY Bilateral    lens implant  . GASTRIC BYPASS    . LUMBAR LAMINECTOMY/DECOMPRESSION MICRODISCECTOMY Right 07/23/2012   Procedure: LUMBAR LAMINECTOMY/DECOMPRESSION MICRODISCECTOMY 2 LEVELS;  Surgeon: Karn Cassis, MD;  Location: [facility];  Service: Neurosurgery;  Laterality: Right;  Right Lumbar three-four  lumbar four-five Laminectomy/Foraminotomy  . NASAL SINUS SURGERY Bilateral   . TONSILLECTOMY    . WRIST SURGERY      Allergies  Allergen Reactions  . Gabapentin Other (See Comments)    Right foot and leg swelled   . Cefadroxil Itching    Ends of hair itched     I have reviewed the patient's current medications . DULoxetine  30 mg Oral Daily  . DULoxetine  60 mg Oral QPM  . enoxaparin (LOVENOX) injection  40 mg Subcutaneous Q24H  . fludrocortisone  0.1 mg Oral BID  . levothyroxine  75 mcg Oral QAC breakfast  . meloxicam  15 mg  Oral Daily  . pantoprazole  40 mg Oral Daily  . pravastatin  20 mg Oral QHS  . pregabalin  100 mg Oral BID  . sodium chloride flush  3 mL Intravenous Q12H  . traZODone  300 mg Oral QHS    acetaminophen **OR** acetaminophen, ketorolac, methocarbamol, ondansetron **OR** ondansetron (ZOFRAN) IV, oxyCODONE, SUMAtriptan  Medication Sig  Aspirin-Salicylamide-Caffeine (BC HEADACHE POWDER PO) Take 1 packet by mouth as needed (headache, pain).  Cholecalciferol (D3-1000) 1000 units capsule Take 1,000 Units by mouth daily.  DULoxetine (CYMBALTA) 30 MG capsule Take 30 mg by mouth every morning.   DULoxetine (CYMBALTA) 60 MG capsule Take 60 mg by mouth every evening.   levothyroxine (SYNTHROID,  LEVOTHROID) 75 MCG tablet Take 75 mcg by mouth daily before breakfast.   lisinopril (PRINIVIL,ZESTRIL) 10 MG tablet Take 10 mg by mouth daily.  meloxicam (MOBIC) 15 MG tablet Take 15 mg by mouth daily.   metoprolol succinate (TOPROL-XL) 25 MG 24 hr tablet Take 25 mg by mouth daily.  omeprazole (PRILOSEC) 20 MG capsule Take 20 mg by mouth 2 (two) times daily before a meal.  pravastatin (PRAVACHOL) 20 MG tablet Take 20 mg by mouth at bedtime.   pregabalin (LYRICA) 100 MG capsule Take 1 capsule (100 mg total) by mouth 2 (two) times daily.  SUMAtriptan (IMITREX) 100 MG tablet Take 100 mg by mouth as needed for headache. Take  at onset of headache, may repeat in 2 hours if needed. Do not exceed 2 doses in 24 hours.  traZODone (DESYREL) 100 MG tablet Take 300 mg by mouth at bedtime.   vitamin B-12 (CYANOCOBALAMIN) 1000 MCG tablet Take 1,000 mcg by mouth daily.     Social History   Social History  . Marital status: Widowed    Spouse name: N/A  . Number of children: N/A  . Years of education: N/A   Occupational History  . unemployed    Social History Main Topics  . Smoking status: Never Smoker  . Smokeless tobacco: Never Used  . Alcohol use Yes     Comment: social  . Drug use: No  . Sexual activity: Not on file   Other Topics Concern  . Not on file   Social History Narrative   Pt lives alone in apartment in [place].    Family Status  Relation Status  . Mother   . Father   . Sister   . Brother    Family History  Problem Relation Age of Onset  . Heart disease Mother   . Emphysema Father   . Diabetes Sister   . Other Brother     Brain tumor - s/p excision     ROS:  Full 14 point review of systems complete and found to be negative unless listed above.  Physical Exam: Blood pressure 106/68, pulse 87, temperature 98.2 F (36.8 C), temperature source Oral, resp. rate 18, height  (1.6 m), weight 265 lb 9.6 oz (120.5 kg), SpO2 97 %.  General: Well developed, well  nourished, female in no acute distress Head: Eyes PERRLA, No xanthomas.   Normocephalic and atraumatic, oropharynx without edema or exudate. Dentition: poor Lungs: scattered rales Heart: HRRR S1 S2, no rub/gallop, no sig murmur. pulses are 2+ all 4 extrem.   Neck: No carotid bruits. No lymphadenopathy.  JVD not elevated but difficult to assess 2nd body habitus Abdomen: Bowel sounds present, abdomen soft and non-tender without masses or hernias noted. Msk:  No spine or cva tenderness. No weakness, no  joint deformities or effusions. R arm splinted, not disturbed. Extremities: No clubbing or cyanosis. No edema.  Neuro: Alert and oriented X 3. No focal deficits noted. Psych:  Good affect, responds appropriately Skin: No rashes or lesions noted.  Labs:   Lab Results  Component Value Date   WBC 8.1 05/25/2016   HGB 10.7 (L) 05/25/2016   HCT 35.3 (L) 05/25/2016   MCV 96.2 05/25/2016   PLT 172 05/25/2016     Recent Labs Lab 05/24/16 0055 05/25/16 0704  NA 137 140  K 4.3 4.3  CL 97* 99*  CO2 30 35*  BUN 21* 20  CREATININE 1.05* 0.90  CALCIUM 8.8* 8.4*  PROT 5.8*  --   BILITOT 0.5  --   ALKPHOS 82  --   ALT 12*  --   AST 21  --   GLUCOSE 123* 99  ALBUMIN 3.6  --     Recent Labs  05/24/16 1142  TROPONINI <0.03    Recent Labs  05/24/16 0319  TROPIPOC 0.01    Echo: 03/24/2016 - Left ventricle: The cavity size was normal. Wall thickness was   normal. Systolic function was normal. The estimated ejection   fraction was in the range of 60% to 65%. Wall motion was normal;   there were no regional wall motion abnormalities. Features are   consistent with a pseudonormal left ventricular filling pattern,   with concomitant abnormal relaxation and increased filling   pressure (grade 2 diastolic dysfunction). - Aortic valve: There was no stenosis. - Mitral valve: There was no significant regurgitation. - Right ventricle: The cavity size was normal. Systolic function was  normal. - Tricuspid valve: Peak RV-RA gradient (S): 28 mm Hg. - Pulmonary arteries: PA peak pressure: 31 mm Hg (S). - Inferior vena cava: The vessel was normal in size. The   respirophasic diameter changes were in the normal range (>= 50%),   consistent with normal central venous pressure. Impressions: - Normal LV size with EF 60-65%. Moderate diastolic dysfunction.   Normal RV size and systolic function. No significant valvular   abnormalities.   ECG:  05/25/2016 Arm leads reversed  05/24/2016 SR, slightly low voltage, ?early repol  Cath: n/a  Radiology:  Dg Shoulder Right Result Date: 05/24/2016 CLINICAL DATA:  Status post syncope and fall. Right shoulder pain. Initial encounter. EXAM: RIGHT SHOULDER - 2+ VIEW COMPARISON:  None. FINDINGS: There is anterior-inferior dislocation of the right humeral head, with a large Hill-Sachs lesion. A small osseous Bankart lesion cannot be excluded. No rib fractures are seen. A few degenerative osseous fragments are noted at the expected location of the humeral head. Mild degenerative change is noted at the right acromioclavicular joint. Chronic pleural thickening is suggested along the right lateral chest wall. IMPRESSION: 1. Anterior-inferior dislocation of the right humeral head, with a large Hill-Sachs lesion. Small osseous Bankart lesion cannot be excluded. 2. Chronic pleural thickening suggested along the right lateral chest wall. Electronically Signed   By: Roanna Raider M.D.   On: 05/24/2016 01:45   Dg Chest Port 1 View Result Date: 05/24/2016 CLINICAL DATA:  Syncope tonight. Right shoulder dislocation and relocation. EXAM: PORTABLE CHEST 1 VIEW COMPARISON:  04/10/2016 FINDINGS: Shallow inspiration with elevation of the right hemidiaphragm. Lungs are grossly clear and expanded. No focal consolidation. No blunting of costophrenic angles. No pneumothorax. Normal heart size and pulmonary vascularity. Incidental note of a large Hill-Sachs  deformity in the right proximal humerus. IMPRESSION: Shallow inspiration.  No evidence of active pulmonary  disease. Electronically Signed   By: Burman Nieves M.D.   On: 05/24/2016 04:43   Dg Shoulder Right Port Result Date: 05/24/2016 CLINICAL DATA:  Postreduction right shoulder EXAM: PORTABLE RIGHT SHOULDER COMPARISON:  05/24/2016 FINDINGS: Interval relocation of the right shoulder with appropriate position of the humeral head with respect to the glenoid demonstrated on the included views. There is a large lateral deformity of the humeral head consistent with a large Hill-Sachs fracture. Loss of subacromial space suggesting chronic rotator cuff arthropathy. Degenerative changes in the acromioclavicular joint. IMPRESSION: Relocation of the right shoulder with large Hill-Sachs fracture deformity. Chronic rotator cuff arthropathy. Electronically Signed   By: Burman Nieves M.D.   On: 05/24/2016 04:45   Dg Shoulder Right Port Result Date: 05/24/2016 CLINICAL DATA:  Postreduction right shoulder EXAM: PORTABLE RIGHT SHOULDER COMPARISON:  05/24/2016 FINDINGS: Technically limited examination due to exposure and overlying lead wires. There is persistent anterior dislocation of the right shoulder with apparent impaction of the posterior humeral head on the glenoid. Small ununited ossicles may represent fracture fragments. IMPRESSION: Persistent anterior dislocation of the right shoulder. Electronically Signed   By: Burman Nieves M.D.   On: 05/24/2016 03:36    ASSESSMENT AND PLAN:   The patient was seen today by Dr Tresa Endo, the patient evaluated and the data reviewed.   1. Syncope:  - no hx arrhythmia - previous episode raised concerns for primary adrenal insufficiency but cortisol and aldosterone levels were normal.  - during 03/2016 episode, pt was out for a prolonged period of time, was cooking dinner and woke up at 3:30 am. CT neg for PE - EF normal by echo 03/2016 - this episode was brief, exact  duration unclear - no prodrome, no obvious reason to suspect seizures.  - orthostatic VS negative on admission - Pt will need event monitor vs loop recorder and f/u as outpt.  - keep on telemetry while in-hospital - has been started on Florinef - home BP meds Toprol XL 25 mg and lisinopril 10 mg on hold. - pt on home dose Synthroid, TSH ok when last checked  Otherwise, per IM. Pt BP slightly low today, requested RN put bed alarm on since pt needed pain rx w/ SBP 101.  Active Problems:   BIPOLAR AFFECTIVE DISORDER   HYPERTENSION, BENIGN ESSENTIAL   Migraine   Hypotension   Hypothyroid   Syncope   Shoulder dislocation, right, initial encounter   Hill Sachs deformity, right   Hyperglycemia   Anterior dislocation of right shoulder   Signed: Theodore Demark, PA-C 05/25/2016 11:57 AM Beeper [phone]  Co-Sign MD  Patient seen and examined. Agree with assessment and plan. Ms Jayelyn Barno is a 71 year old obese female who was admitted following her second profound spell in the last 2 months.  She has a history of bipolar disorder, GERD, migraines, hypertension, hyperlipidemia, and is him, fibromyalgia, and has a documented 4.2 cm ascending aortic aneurysm for which she is seen Dr. Laneta Simmers.  She was admitted in February 2018 after she had been standing for some time while frying food.  During that evaluation, her lisinopril was discontinued.  She was started on Florinef.  Echo Doppler study showed an EF of 60-65% with grade 2 diastolic dysfunction.  Normal wall motion.  There was no evidence for aortic stenosis.  A head CT was negative for acute abnormality.  Orthostatic vital signs were negative.  A chest CT did not show evidence for PE or pneumonia.  He had low blood pressure.  A morning cortisols was 10.7.  She was given fluids.  He had a 30 day prescription for Florinef without refills.  She was admitted this admission after walking towards the kitchen  She does not remember anything after  this, except that when she awakened she was on the floor complaining of severe shoulder discomfort and subsequent has been found to have a dislocated right shoulder.  She denies any awareness of chest pain.  She denies any awareness of a prodrome of tachycardia or bradycardia.  On exam, she is obese and has a thick neck.  Sclerae anicteric.  Mallinpatti 3.  I did not discern any definitive carotid bruits.  Lungs were clear.  There was no chest wall tenderness.  Rhythm was regular with no gallop, rub thrills or heaves.  There was a with a systolic murmur.  There was central adiposity.  There was no significant edema.  Neurologic exam was grossly nonfocal.  Her initial ECG is normal sinus rhythm at 63 without significant ST changes.  QTc interval is normal at 413 ms.  Subsequent ECG shows arm lead reversal.  At present, recommend close monitoring of orthostatic vital signs.  He appears his been documented have normal systolic function out significant valvular pathology.  Consider carotid duplex imaging of her recent profound event leading to shoulder dislocation, consider P assessment for possible loop recording and consideration for tilt table testing.  Assess for neurocardiogenic syncope.  In addition, it may be worthwhile to consider neurologic assessment to make certain there is no significant underlying autonomic neuropathic issues. We will follow.   Lennette Bihari, MD, [facility] 05/25/2016 1:34 PM

## 2016-05-26 ENCOUNTER — Encounter (HOSPITAL_COMMUNITY): Payer: PPO

## 2016-05-26 DIAGNOSIS — S43014A Anterior dislocation of right humerus, initial encounter: Secondary | ICD-10-CM

## 2016-05-26 DIAGNOSIS — R55 Syncope and collapse: Secondary | ICD-10-CM

## 2016-05-26 DIAGNOSIS — S499 Unspecified injury of shoulder and upper arm: Secondary | ICD-10-CM | POA: Diagnosis not present

## 2016-05-26 LAB — GLUCOSE, CAPILLARY
Glucose-Capillary: 105 mg/dL — ABNORMAL HIGH (ref 65–99)
Glucose-Capillary: 80 mg/dL (ref 65–99)
Glucose-Capillary: 126 mg/dL — ABNORMAL HIGH (ref 65–99)

## 2016-05-26 MED ORDER — DULOXETINE HCL 30 MG PO CPEP: 30.0000 mg | ORAL_CAPSULE | Freq: Two times a day (BID) | ORAL | 0 refills | Status: DC

## 2016-05-26 MED ORDER — FLUDROCORTISONE ACETATE 0.1 MG PO TABS
0.1000 mg | ORAL_TABLET | Freq: Two times a day (BID) | ORAL | 0 refills | Status: DC
Start: 2016-05-26 — End: 2016-10-01

## 2016-05-26 MED ORDER — METHOCARBAMOL 500 MG PO TABS: 500.0000 mg | ORAL_TABLET | Freq: Four times a day (QID) | ORAL | 0 refills | Status: DC | PRN

## 2016-05-26 MED ORDER — OXYCODONE HCL 5 MG PO TABS: 5.0000 mg | ORAL_TABLET | Freq: Four times a day (QID) | ORAL | 0 refills | Status: DC | PRN

## 2016-05-26 NOTE — Discharge Summary (Signed)
Lindsey Rowe, is a 71 y.o. female  DOB 03-Jul-1945  MRN [id number].  Admission date:  05/24/2016  Admitting Physician  Hillary Bow, DO  Discharge Date:  05/26/2016   Primary MD  Astrid Divine, MD  Recommendations for primary care physician for things to follow:   Syncope: suspect from labile BPs , previously thought to have primary adrenal insufficiency or mineralocorticoid deficiency  Metoprolol and lisinopril were stopped.  LINQ recordered placed on 4/20 Pt started on florinef 0.1mg  po bid (pt states this has not helped in the past) Cardiology consulted and pt has follow up arranged  (by Armanda Magic)  Dislocated right shoulder Apparently orthopedic service was contacted by EDP appt scheduled for 05/31/16 with [facility]   however time it TBD and Ortho office to call pt w/ the time of the appt. cont robaxin.  GERD: Cont omeprazole  HLD: continue pravastatin  Hyperglycemia: 123 on admission - A1c 5.2  - CBG AC  Bipolar/Insomnia: continue cymbalta and trazodone  MSK/Neuropathic pain: continue lyrica   Migraines: currently w/o migraine continue imitrex prn  Anemia Repeat cbc in 1 week  Admission Diagnosis  Syncope [R55] Anterior dislocation of right shoulder, initial encounter [S43.014A] Syncope, unspecified syncope type [R55]   Discharge Diagnosis  Syncope [R55] Anterior dislocation of right shoulder, initial encounter [S43.014A] Syncope, unspecified syncope type [R55]    Active Problems:   BIPOLAR AFFECTIVE DISORDER   HYPERTENSION, BENIGN ESSENTIAL   Migraine   Hypotension   Hypothyroid   Syncope   Shoulder dislocation, right, initial encounter   Hill Sachs deformity, right   Hyperglycemia   Anterior dislocation of right shoulder      Past Medical History:  Diagnosis Date  . Arthritis   . Back pain   . Bipolar 1 disorder (HCC)   .  Complication of anesthesia    hard to wake up anesthesia  . Depression   . GERD (gastroesophageal reflux disease)   . Headache(784.0)    migraines and tension headaches  . Hyperlipidemia   . Hypertension   . Hypothyroidism   . Thyroid disease     Past Surgical History:  Procedure Laterality Date  . APPENDECTOMY    . BACK SURGERY    . EYE SURGERY Bilateral    lens implant  . GASTRIC BYPASS    . LUMBAR LAMINECTOMY/DECOMPRESSION MICRODISCECTOMY Right 07/23/2012   Procedure: LUMBAR LAMINECTOMY/DECOMPRESSION MICRODISCECTOMY 2 LEVELS;  Surgeon: Karn Cassis, MD;  Location: [facility];  Service: Neurosurgery;  Laterality: Right;  Right Lumbar three-four  lumbar four-five Laminectomy/Foraminotomy  . NASAL SINUS SURGERY Bilateral   . TONSILLECTOMY    . WRIST SURGERY         HPI  from the history and physical done on the day of admission:     71 y.o. female with medical history significant of bipolar disorder, GERD, migraines, hyperlipidemia, hypertension, hypothyroidism.   Patient presenting with primary complaint of syncopal episode and right shoulder pain. Patient states she passed out while ambulating through  her house. When she awoke her right shoulder was severely painful and out of place. Pain worse with any attempt of movement. Pain is constant and nothing makes it better. With regards to syncopal episode patient denies any presyncopal symptoms such as dizziness, lightheadedness and denies any post syncopal symptoms such as confusion, loss of bowel or bladder function, tongue biting. Patient states that this event occurred very similar to her previous. Patient had been ambulating doing through the mouth for or sometime when she syncopized. Patient states that she was started on metoprolol by her primary care physician approximately 1 week prior to episode. Patient denies any feelings of lightheadedness, lethargy, fatigue since starting metoprolol.    ED Course: objective  findings below. Shoulder reduced in ED w/ ketamine for sedation    Hospital Course:     Pt was admitted and Linq was placed. Pt has not had any significant arrythmia.  Cardiology was consulted.  Trop negative.  R shoulder is in sling after reduction and pt seems comfortable.  Pt will need to follow up with orthopedics.  Pt was placed on Florinef and metoprolol and lisinopril were Held.  Pt bp low at 107/62 without medication. Pt will be discharged to home off metoprolol and lisinopril.   Pt appears stable and will go home today.    Follow UP  Follow-up Information    [facility] [address] [facility] Follow up on 06/04/2016.   Specialty:  Cardiology Why:  4:00PM, wound check Contact information: [address] [place] [address] [phone]           Consults obtained - cardiology  Discharge Condition: stable  Diet and Activity recommendation: See Discharge Instructions below  Discharge Instructions      Discharge Medications     Allergies as of 05/26/2016      Reactions   Gabapentin Other (See Comments)   Right foot and leg swelled    Cefadroxil Itching   Ends of hair itched       Medication List    STOP taking these medications   lisinopril 10 MG tablet Commonly known as:  PRINIVIL,ZESTRIL   metoprolol succinate 25 MG 24 hr tablet Commonly known as:  TOPROL-XL     TAKE these medications   BC HEADACHE POWDER PO Take 1 packet by mouth as needed (headache, pain).   D3-1000 1000 units capsule Generic drug:  Cholecalciferol Take 1,000 Units by mouth daily.   DULoxetine 30 MG capsule Commonly known as:  CYMBALTA Take 1 capsule (30 mg total) by mouth 2 (two) times daily. What changed:  when to take this  Another medication with the same name was removed. Continue taking this medication, and follow the directions you see here.   fludrocortisone 0.1 MG tablet Commonly known as:  FLORINEF Take 1 tablet (0.1 mg total) by  mouth 2 (two) times daily.   levothyroxine 75 MCG tablet Commonly known as:  SYNTHROID, LEVOTHROID Take 75 mcg by mouth daily before breakfast.   meloxicam 15 MG tablet Commonly known as:  MOBIC Take 15 mg by mouth daily.   methocarbamol 500 MG tablet Commonly known as:  ROBAXIN Take 1 tablet (500 mg total) by mouth every 6 (six) hours as needed for muscle spasms.   omeprazole 20 MG capsule Commonly known as:  PRILOSEC Take 20 mg by mouth 2 (two) times daily before a meal.   oxyCODONE 5 MG immediate release tablet Commonly known as:  Oxy IR/ROXICODONE Take 1 tablet (5  mg total) by mouth every 6 (six) hours as needed for severe pain.   pravastatin 20 MG tablet Commonly known as:  PRAVACHOL Take 20 mg by mouth at bedtime.   pregabalin 100 MG capsule Commonly known as:  LYRICA Take 1 capsule (100 mg total) by mouth 2 (two) times daily.   SUMAtriptan 100 MG tablet Commonly known as:  IMITREX Take 100 mg by mouth as needed for headache. Take  at onset of headache, may repeat in 2 hours if needed. Do not exceed 2 doses in 24 hours.   traZODone 100 MG tablet Commonly known as:  DESYREL Take 300 mg by mouth at bedtime.   vitamin B-12 1000 MCG tablet Commonly known as:  CYANOCOBALAMIN Take 1,000 mcg by mouth daily.       Major procedures and Radiology Reports - PLEASE review detailed and final reports for all details, in brief -      Dg Shoulder Right  Result Date: 05/24/2016 CLINICAL DATA:  Status post syncope and fall. Right shoulder pain. Initial encounter. EXAM: RIGHT SHOULDER - 2+ VIEW COMPARISON:  None. FINDINGS: There is anterior-inferior dislocation of the right humeral head, with a large Hill-Sachs lesion. A small osseous Bankart lesion cannot be excluded. No rib fractures are seen. A few degenerative osseous fragments are noted at the expected location of the humeral head. Mild degenerative change is noted at the right acromioclavicular joint. Chronic pleural  thickening is suggested along the right lateral chest wall. IMPRESSION: 1. Anterior-inferior dislocation of the right humeral head, with a large Hill-Sachs lesion. Small osseous Bankart lesion cannot be excluded. 2. Chronic pleural thickening suggested along the right lateral chest wall. Electronically Signed   By: Roanna Raider M.D.   On: 05/24/2016 01:45   Dg Chest Port 1 View  Result Date: 05/24/2016 CLINICAL DATA:  Syncope tonight. Right shoulder dislocation and relocation. EXAM: PORTABLE CHEST 1 VIEW COMPARISON:  04/10/2016 FINDINGS: Shallow inspiration with elevation of the right hemidiaphragm. Lungs are grossly clear and expanded. No focal consolidation. No blunting of costophrenic angles. No pneumothorax. Normal heart size and pulmonary vascularity. Incidental note of a large Hill-Sachs deformity in the right proximal humerus. IMPRESSION: Shallow inspiration.  No evidence of active pulmonary disease. Electronically Signed   By: Burman Nieves M.D.   On: 05/24/2016 04:43   Dg Shoulder Right Port  Result Date: 05/24/2016 CLINICAL DATA:  Postreduction right shoulder EXAM: PORTABLE RIGHT SHOULDER COMPARISON:  05/24/2016 FINDINGS: Interval relocation of the right shoulder with appropriate position of the humeral head with respect to the glenoid demonstrated on the included views. There is a large lateral deformity of the humeral head consistent with a large Hill-Sachs fracture. Loss of subacromial space suggesting chronic rotator cuff arthropathy. Degenerative changes in the acromioclavicular joint. IMPRESSION: Relocation of the right shoulder with large Hill-Sachs fracture deformity. Chronic rotator cuff arthropathy. Electronically Signed   By: Burman Nieves M.D.   On: 05/24/2016 04:45   Dg Shoulder Right Port  Result Date: 05/24/2016 CLINICAL DATA:  Postreduction right shoulder EXAM: PORTABLE RIGHT SHOULDER COMPARISON:  05/24/2016 FINDINGS: Technically limited examination due to exposure and  overlying lead wires. There is persistent anterior dislocation of the right shoulder with apparent impaction of the posterior humeral head on the glenoid. Small ununited ossicles may represent fracture fragments. IMPRESSION: Persistent anterior dislocation of the right shoulder. Electronically Signed   By: Burman Nieves M.D.   On: 05/24/2016 03:36    Micro Results     Recent Results (from the  past 240 hour(s))  Urine culture     Status: Abnormal   Collection Time: 05/24/16  9:00 AM  Result Value Ref Range Status   Specimen Description URINE, CLEAN CATCH  Final   Special Requests NONE  Final   Culture <10,000 COLONIES/mL INSIGNIFICANT GROWTH (A)  Final   Report Status 05/25/2016 FINAL  Final       Today   Subjective    Lindsey Rowe today has not had dizziness or syncope.   no headache,no chest abdominal pain,no new weakness tingling or numbness, feels much better wants to go home today.    Objective   Blood pressure 107/62, pulse 88, temperature 97.6 F (36.4 C), temperature source Oral, resp. rate 18, height  (1.6 m), weight 121.2 kg (267 lb 3.2 oz), SpO2 100 %.  No intake or output data in the 24 hours ending 05/26/16 1738  Exam Awake Alert, Oriented x 3, No new F.N deficits, Normal affect [place].AT,PERRAL Supple Neck,No JVD, No cervical lymphadenopathy appriciated.  Symmetrical Chest wall movement, Good air movement bilaterally, CTAB RRR,No Gallops,Rubs or new Murmurs, No Parasternal Heave +ve B.Sounds, Abd Soft, Non tender, No organomegaly appriciated, No rebound -guarding or rigidity. No Cyanosis, Clubbing or edema, No new Rash or bruise   Data Review   CBC w Diff:  Lab Results  Component Value Date   WBC 8.1 05/25/2016   HGB 10.7 (L) 05/25/2016   HCT 35.3 (L) 05/25/2016   PLT 172 05/25/2016   LYMPHOPCT 19 05/24/2016   MONOPCT 8 05/24/2016   EOSPCT 1 05/24/2016   BASOPCT 0 05/24/2016    CMP:  Lab Results  Component Value Date   NA 140 05/25/2016     K 4.3 05/25/2016   CL 99 (L) 05/25/2016   CO2 35 (H) 05/25/2016   BUN 20 05/25/2016   CREATININE 0.90 05/25/2016   PROT 5.8 (L) 05/24/2016   ALBUMIN 3.6 05/24/2016   BILITOT 0.5 05/24/2016   ALKPHOS 82 05/24/2016   AST 21 05/24/2016   ALT 12 (L) 05/24/2016  .   Total Time in preparing paper work, data evaluation and todays exam - 35 minutes  Pearson Grippe M.D on 05/26/2016 at 5:38 PM  [facility]   Office  [phone]

## 2016-05-26 NOTE — Progress Notes (Signed)
LINQ recorder placed yesterday.  No new recs today. Patient ok from cardiac standpoint for discharge. Will sign off.  Please call with any questions.  She has been set up as outpt for followup in our office.

## 2016-05-26 NOTE — Progress Notes (Signed)
Discussed with the patient and all questioned fully answered. She will call me if any problems arise.  [facility] called. IV removed. Telemetry removed, CCMD notified.   Leonidas Romberg, RN

## 2016-05-26 NOTE — Progress Notes (Signed)
Took over pt care at 3pm. Pt complained of pain, given PRN oxycodone and educated on PRN pain medications. Call bell within reach. Call bell within reach, will continue to monitor.    Leonidas Romberg, RN

## 2016-05-26 NOTE — Progress Notes (Signed)
Patient discharged home via [facility] on 2L O2. Patient given robaxin and tylenol before d.c. All belongings sent with patient along with loop recorder supplies.

## 2016-05-27 ENCOUNTER — Encounter (HOSPITAL_COMMUNITY): Payer: Self-pay | Admitting: Cardiology

## 2016-05-28 ENCOUNTER — Ambulatory Visit: Payer: PPO | Admitting: Registered"

## 2016-05-28 LAB — ACTH: C206 ACTH: 5.2 pg/mL — ABNORMAL LOW (ref 7.2–63.3)

## 2016-06-01 DIAGNOSIS — N183 Chronic kidney disease, stage 3 (moderate): Secondary | ICD-10-CM | POA: Diagnosis not present

## 2016-06-01 DIAGNOSIS — J45909 Unspecified asthma, uncomplicated: Secondary | ICD-10-CM | POA: Diagnosis not present

## 2016-06-01 DIAGNOSIS — E039 Hypothyroidism, unspecified: Secondary | ICD-10-CM | POA: Diagnosis not present

## 2016-06-01 DIAGNOSIS — Z9181 History of falling: Secondary | ICD-10-CM | POA: Diagnosis not present

## 2016-06-01 DIAGNOSIS — G43909 Migraine, unspecified, not intractable, without status migrainosus: Secondary | ICD-10-CM | POA: Diagnosis not present

## 2016-06-01 DIAGNOSIS — M797 Fibromyalgia: Secondary | ICD-10-CM | POA: Diagnosis not present

## 2016-06-01 DIAGNOSIS — I129 Hypertensive chronic kidney disease with stage 1 through stage 4 chronic kidney disease, or unspecified chronic kidney disease: Secondary | ICD-10-CM | POA: Diagnosis not present

## 2016-06-01 DIAGNOSIS — Z9981 Dependence on supplemental oxygen: Secondary | ICD-10-CM | POA: Diagnosis not present

## 2016-06-01 DIAGNOSIS — F319 Bipolar disorder, unspecified: Secondary | ICD-10-CM | POA: Diagnosis not present

## 2016-06-01 DIAGNOSIS — E7800 Pure hypercholesterolemia, unspecified: Secondary | ICD-10-CM | POA: Diagnosis not present

## 2016-06-01 DIAGNOSIS — M48061 Spinal stenosis, lumbar region without neurogenic claudication: Secondary | ICD-10-CM | POA: Diagnosis not present

## 2016-06-04 ENCOUNTER — Ambulatory Visit: Payer: PPO

## 2016-06-05 ENCOUNTER — Ambulatory Visit (INDEPENDENT_AMBULATORY_CARE_PROVIDER_SITE_OTHER): Payer: PPO | Admitting: *Deleted

## 2016-06-05 ENCOUNTER — Telehealth: Payer: Self-pay | Admitting: *Deleted

## 2016-06-05 DIAGNOSIS — R55 Syncope and collapse: Secondary | ICD-10-CM

## 2016-06-05 NOTE — Telephone Encounter (Signed)
LMOVM regarding symptoms associated with symptom episode on Jun 05, 2016 at 11:41 on LINQ. ECG appears NSR. Gave [facility] phone number to call back.

## 2016-06-06 DIAGNOSIS — J45909 Unspecified asthma, uncomplicated: Secondary | ICD-10-CM | POA: Diagnosis not present

## 2016-06-06 DIAGNOSIS — F319 Bipolar disorder, unspecified: Secondary | ICD-10-CM | POA: Diagnosis not present

## 2016-06-06 DIAGNOSIS — R0902 Hypoxemia: Secondary | ICD-10-CM | POA: Diagnosis not present

## 2016-06-06 DIAGNOSIS — R55 Syncope and collapse: Secondary | ICD-10-CM | POA: Diagnosis not present

## 2016-06-06 DIAGNOSIS — M797 Fibromyalgia: Secondary | ICD-10-CM | POA: Diagnosis not present

## 2016-06-06 DIAGNOSIS — I129 Hypertensive chronic kidney disease with stage 1 through stage 4 chronic kidney disease, or unspecified chronic kidney disease: Secondary | ICD-10-CM | POA: Diagnosis not present

## 2016-06-06 DIAGNOSIS — M48061 Spinal stenosis, lumbar region without neurogenic claudication: Secondary | ICD-10-CM | POA: Diagnosis not present

## 2016-06-06 DIAGNOSIS — E7800 Pure hypercholesterolemia, unspecified: Secondary | ICD-10-CM | POA: Diagnosis not present

## 2016-06-06 DIAGNOSIS — Z9181 History of falling: Secondary | ICD-10-CM | POA: Diagnosis not present

## 2016-06-06 DIAGNOSIS — Z9981 Dependence on supplemental oxygen: Secondary | ICD-10-CM | POA: Diagnosis not present

## 2016-06-06 DIAGNOSIS — E039 Hypothyroidism, unspecified: Secondary | ICD-10-CM | POA: Diagnosis not present

## 2016-06-06 DIAGNOSIS — N183 Chronic kidney disease, stage 3 (moderate): Secondary | ICD-10-CM | POA: Diagnosis not present

## 2016-06-06 DIAGNOSIS — G43909 Migraine, unspecified, not intractable, without status migrainosus: Secondary | ICD-10-CM | POA: Diagnosis not present

## 2016-06-06 LAB — CUP PACEART INCLINIC DEVICE CHECK
Date Time Interrogation Session: 20180502095731
Implantable Pulse Generator Implant Date: 20180420

## 2016-06-06 NOTE — Progress Notes (Signed)
Wound check in clinic s/p ILR implant. Superficial incision edges are unapproximated. Patient states that her steri strips fell off 4 days post procedure. Steri strips reapplied to the site. Patient to follow up next week for a wound recheck.  Battery status: GOOD. 0 symptom episodes, 0 tachy episodes, 0 pause episodes, 0 brady episodes. 0 AF episodes (0% burden). Monthly summary reports.

## 2016-06-06 NOTE — Telephone Encounter (Signed)
Per Device RN patient used symptom activator in clinic 06/05/16 as demo with RN.

## 2016-06-08 ENCOUNTER — Institutional Professional Consult (permissible substitution): Payer: PPO | Admitting: Internal Medicine

## 2016-06-08 DIAGNOSIS — S43084A Other dislocation of right shoulder joint, initial encounter: Secondary | ICD-10-CM | POA: Diagnosis not present

## 2016-06-09 DIAGNOSIS — R55 Syncope and collapse: Secondary | ICD-10-CM | POA: Diagnosis not present

## 2016-06-09 DIAGNOSIS — R0902 Hypoxemia: Secondary | ICD-10-CM | POA: Diagnosis not present

## 2016-06-09 DIAGNOSIS — J45909 Unspecified asthma, uncomplicated: Secondary | ICD-10-CM | POA: Diagnosis not present

## 2016-06-09 DIAGNOSIS — I129 Hypertensive chronic kidney disease with stage 1 through stage 4 chronic kidney disease, or unspecified chronic kidney disease: Secondary | ICD-10-CM | POA: Diagnosis not present

## 2016-06-12 DIAGNOSIS — J9691 Respiratory failure, unspecified with hypoxia: Secondary | ICD-10-CM | POA: Diagnosis not present

## 2016-06-12 DIAGNOSIS — I129 Hypertensive chronic kidney disease with stage 1 through stage 4 chronic kidney disease, or unspecified chronic kidney disease: Secondary | ICD-10-CM | POA: Diagnosis not present

## 2016-06-12 DIAGNOSIS — E662 Morbid (severe) obesity with alveolar hypoventilation: Secondary | ICD-10-CM | POA: Diagnosis not present

## 2016-06-12 DIAGNOSIS — R55 Syncope and collapse: Secondary | ICD-10-CM | POA: Diagnosis not present

## 2016-06-12 DIAGNOSIS — S43006A Unspecified dislocation of unspecified shoulder joint, initial encounter: Secondary | ICD-10-CM | POA: Diagnosis not present

## 2016-06-12 DIAGNOSIS — N183 Chronic kidney disease, stage 3 (moderate): Secondary | ICD-10-CM | POA: Diagnosis not present

## 2016-06-12 DIAGNOSIS — K219 Gastro-esophageal reflux disease without esophagitis: Secondary | ICD-10-CM | POA: Diagnosis not present

## 2016-06-12 DIAGNOSIS — D649 Anemia, unspecified: Secondary | ICD-10-CM | POA: Diagnosis not present

## 2016-06-13 ENCOUNTER — Ambulatory Visit (INDEPENDENT_AMBULATORY_CARE_PROVIDER_SITE_OTHER): Payer: Self-pay | Admitting: *Deleted

## 2016-06-13 DIAGNOSIS — I495 Sick sinus syndrome: Secondary | ICD-10-CM

## 2016-06-13 NOTE — Progress Notes (Signed)
Wound re-check appointment. Steri-strips removed. Wound without redness, edema, or drainage. Incision edges approximated, wound well healed. Educated on wound care. ROV with WC 09/03/16.

## 2016-06-15 DIAGNOSIS — Z9181 History of falling: Secondary | ICD-10-CM | POA: Diagnosis not present

## 2016-06-15 DIAGNOSIS — E7800 Pure hypercholesterolemia, unspecified: Secondary | ICD-10-CM | POA: Diagnosis not present

## 2016-06-15 DIAGNOSIS — E039 Hypothyroidism, unspecified: Secondary | ICD-10-CM | POA: Diagnosis not present

## 2016-06-15 DIAGNOSIS — Z9981 Dependence on supplemental oxygen: Secondary | ICD-10-CM | POA: Diagnosis not present

## 2016-06-15 DIAGNOSIS — I129 Hypertensive chronic kidney disease with stage 1 through stage 4 chronic kidney disease, or unspecified chronic kidney disease: Secondary | ICD-10-CM | POA: Diagnosis not present

## 2016-06-15 DIAGNOSIS — M48061 Spinal stenosis, lumbar region without neurogenic claudication: Secondary | ICD-10-CM | POA: Diagnosis not present

## 2016-06-15 DIAGNOSIS — F319 Bipolar disorder, unspecified: Secondary | ICD-10-CM | POA: Diagnosis not present

## 2016-06-15 DIAGNOSIS — N183 Chronic kidney disease, stage 3 (moderate): Secondary | ICD-10-CM | POA: Diagnosis not present

## 2016-06-15 DIAGNOSIS — G43909 Migraine, unspecified, not intractable, without status migrainosus: Secondary | ICD-10-CM | POA: Diagnosis not present

## 2016-06-15 DIAGNOSIS — M797 Fibromyalgia: Secondary | ICD-10-CM | POA: Diagnosis not present

## 2016-06-15 DIAGNOSIS — J45909 Unspecified asthma, uncomplicated: Secondary | ICD-10-CM | POA: Diagnosis not present

## 2016-06-18 DIAGNOSIS — N183 Chronic kidney disease, stage 3 (moderate): Secondary | ICD-10-CM | POA: Diagnosis not present

## 2016-06-18 DIAGNOSIS — F319 Bipolar disorder, unspecified: Secondary | ICD-10-CM | POA: Diagnosis not present

## 2016-06-18 DIAGNOSIS — Z9981 Dependence on supplemental oxygen: Secondary | ICD-10-CM | POA: Diagnosis not present

## 2016-06-18 DIAGNOSIS — M797 Fibromyalgia: Secondary | ICD-10-CM | POA: Diagnosis not present

## 2016-06-18 DIAGNOSIS — M48061 Spinal stenosis, lumbar region without neurogenic claudication: Secondary | ICD-10-CM | POA: Diagnosis not present

## 2016-06-18 DIAGNOSIS — G43909 Migraine, unspecified, not intractable, without status migrainosus: Secondary | ICD-10-CM | POA: Diagnosis not present

## 2016-06-18 DIAGNOSIS — J45909 Unspecified asthma, uncomplicated: Secondary | ICD-10-CM | POA: Diagnosis not present

## 2016-06-18 DIAGNOSIS — E039 Hypothyroidism, unspecified: Secondary | ICD-10-CM | POA: Diagnosis not present

## 2016-06-18 DIAGNOSIS — E7800 Pure hypercholesterolemia, unspecified: Secondary | ICD-10-CM | POA: Diagnosis not present

## 2016-06-18 DIAGNOSIS — I129 Hypertensive chronic kidney disease with stage 1 through stage 4 chronic kidney disease, or unspecified chronic kidney disease: Secondary | ICD-10-CM | POA: Diagnosis not present

## 2016-06-18 DIAGNOSIS — Z9181 History of falling: Secondary | ICD-10-CM | POA: Diagnosis not present

## 2016-06-19 ENCOUNTER — Institutional Professional Consult (permissible substitution): Payer: PPO | Admitting: Internal Medicine

## 2016-06-22 DIAGNOSIS — S43084D Other dislocation of right shoulder joint, subsequent encounter: Secondary | ICD-10-CM | POA: Diagnosis not present

## 2016-06-25 ENCOUNTER — Ambulatory Visit (INDEPENDENT_AMBULATORY_CARE_PROVIDER_SITE_OTHER): Payer: PPO | Admitting: *Deleted

## 2016-06-25 DIAGNOSIS — R55 Syncope and collapse: Secondary | ICD-10-CM

## 2016-06-25 NOTE — Progress Notes (Signed)
Carelink Summary Report / Loop Recorder 

## 2016-06-27 DIAGNOSIS — M797 Fibromyalgia: Secondary | ICD-10-CM | POA: Diagnosis not present

## 2016-06-27 DIAGNOSIS — I712 Thoracic aortic aneurysm, without rupture: Secondary | ICD-10-CM | POA: Diagnosis not present

## 2016-06-27 DIAGNOSIS — J45909 Unspecified asthma, uncomplicated: Secondary | ICD-10-CM | POA: Diagnosis not present

## 2016-06-27 DIAGNOSIS — F319 Bipolar disorder, unspecified: Secondary | ICD-10-CM | POA: Diagnosis not present

## 2016-06-27 DIAGNOSIS — M48061 Spinal stenosis, lumbar region without neurogenic claudication: Secondary | ICD-10-CM | POA: Diagnosis not present

## 2016-06-27 DIAGNOSIS — G43909 Migraine, unspecified, not intractable, without status migrainosus: Secondary | ICD-10-CM | POA: Diagnosis not present

## 2016-06-27 DIAGNOSIS — I129 Hypertensive chronic kidney disease with stage 1 through stage 4 chronic kidney disease, or unspecified chronic kidney disease: Secondary | ICD-10-CM | POA: Diagnosis not present

## 2016-06-27 DIAGNOSIS — N183 Chronic kidney disease, stage 3 (moderate): Secondary | ICD-10-CM | POA: Diagnosis not present

## 2016-06-27 DIAGNOSIS — S43004D Unspecified dislocation of right shoulder joint, subsequent encounter: Secondary | ICD-10-CM | POA: Diagnosis not present

## 2016-06-27 DIAGNOSIS — Z9981 Dependence on supplemental oxygen: Secondary | ICD-10-CM | POA: Diagnosis not present

## 2016-06-27 DIAGNOSIS — Z9181 History of falling: Secondary | ICD-10-CM | POA: Diagnosis not present

## 2016-06-27 DIAGNOSIS — Z7982 Long term (current) use of aspirin: Secondary | ICD-10-CM | POA: Diagnosis not present

## 2016-06-28 LAB — CUP PACEART REMOTE DEVICE CHECK
Date Time Interrogation Session: 20180520204334
Implantable Pulse Generator Implant Date: 20180420

## 2016-07-03 DIAGNOSIS — S43004D Unspecified dislocation of right shoulder joint, subsequent encounter: Secondary | ICD-10-CM | POA: Diagnosis not present

## 2016-07-03 DIAGNOSIS — M797 Fibromyalgia: Secondary | ICD-10-CM | POA: Diagnosis not present

## 2016-07-03 DIAGNOSIS — I712 Thoracic aortic aneurysm, without rupture: Secondary | ICD-10-CM | POA: Diagnosis not present

## 2016-07-03 DIAGNOSIS — G43909 Migraine, unspecified, not intractable, without status migrainosus: Secondary | ICD-10-CM | POA: Diagnosis not present

## 2016-07-03 DIAGNOSIS — I129 Hypertensive chronic kidney disease with stage 1 through stage 4 chronic kidney disease, or unspecified chronic kidney disease: Secondary | ICD-10-CM | POA: Diagnosis not present

## 2016-07-03 DIAGNOSIS — F319 Bipolar disorder, unspecified: Secondary | ICD-10-CM | POA: Diagnosis not present

## 2016-07-03 DIAGNOSIS — Z9981 Dependence on supplemental oxygen: Secondary | ICD-10-CM | POA: Diagnosis not present

## 2016-07-03 DIAGNOSIS — Z7982 Long term (current) use of aspirin: Secondary | ICD-10-CM | POA: Diagnosis not present

## 2016-07-03 DIAGNOSIS — J45909 Unspecified asthma, uncomplicated: Secondary | ICD-10-CM | POA: Diagnosis not present

## 2016-07-03 DIAGNOSIS — M48061 Spinal stenosis, lumbar region without neurogenic claudication: Secondary | ICD-10-CM | POA: Diagnosis not present

## 2016-07-03 DIAGNOSIS — N183 Chronic kidney disease, stage 3 (moderate): Secondary | ICD-10-CM | POA: Diagnosis not present

## 2016-07-03 DIAGNOSIS — Z9181 History of falling: Secondary | ICD-10-CM | POA: Diagnosis not present

## 2016-07-07 DIAGNOSIS — I129 Hypertensive chronic kidney disease with stage 1 through stage 4 chronic kidney disease, or unspecified chronic kidney disease: Secondary | ICD-10-CM | POA: Diagnosis not present

## 2016-07-07 DIAGNOSIS — J45909 Unspecified asthma, uncomplicated: Secondary | ICD-10-CM | POA: Diagnosis not present

## 2016-07-07 DIAGNOSIS — R55 Syncope and collapse: Secondary | ICD-10-CM | POA: Diagnosis not present

## 2016-07-07 DIAGNOSIS — R0902 Hypoxemia: Secondary | ICD-10-CM | POA: Diagnosis not present

## 2016-07-10 DIAGNOSIS — N183 Chronic kidney disease, stage 3 (moderate): Secondary | ICD-10-CM | POA: Diagnosis not present

## 2016-07-10 DIAGNOSIS — S43004D Unspecified dislocation of right shoulder joint, subsequent encounter: Secondary | ICD-10-CM | POA: Diagnosis not present

## 2016-07-10 DIAGNOSIS — Z9981 Dependence on supplemental oxygen: Secondary | ICD-10-CM | POA: Diagnosis not present

## 2016-07-10 DIAGNOSIS — M48061 Spinal stenosis, lumbar region without neurogenic claudication: Secondary | ICD-10-CM | POA: Diagnosis not present

## 2016-07-10 DIAGNOSIS — R55 Syncope and collapse: Secondary | ICD-10-CM | POA: Diagnosis not present

## 2016-07-10 DIAGNOSIS — F319 Bipolar disorder, unspecified: Secondary | ICD-10-CM | POA: Diagnosis not present

## 2016-07-10 DIAGNOSIS — R0902 Hypoxemia: Secondary | ICD-10-CM | POA: Diagnosis not present

## 2016-07-10 DIAGNOSIS — I712 Thoracic aortic aneurysm, without rupture: Secondary | ICD-10-CM | POA: Diagnosis not present

## 2016-07-10 DIAGNOSIS — Z9181 History of falling: Secondary | ICD-10-CM | POA: Diagnosis not present

## 2016-07-10 DIAGNOSIS — M797 Fibromyalgia: Secondary | ICD-10-CM | POA: Diagnosis not present

## 2016-07-10 DIAGNOSIS — G43909 Migraine, unspecified, not intractable, without status migrainosus: Secondary | ICD-10-CM | POA: Diagnosis not present

## 2016-07-10 DIAGNOSIS — Z7982 Long term (current) use of aspirin: Secondary | ICD-10-CM | POA: Diagnosis not present

## 2016-07-10 DIAGNOSIS — I129 Hypertensive chronic kidney disease with stage 1 through stage 4 chronic kidney disease, or unspecified chronic kidney disease: Secondary | ICD-10-CM | POA: Diagnosis not present

## 2016-07-10 DIAGNOSIS — J45909 Unspecified asthma, uncomplicated: Secondary | ICD-10-CM | POA: Diagnosis not present

## 2016-07-11 ENCOUNTER — Encounter: Payer: Self-pay | Admitting: Cardiology

## 2016-07-16 DIAGNOSIS — I712 Thoracic aortic aneurysm, without rupture: Secondary | ICD-10-CM | POA: Diagnosis not present

## 2016-07-16 DIAGNOSIS — Z9981 Dependence on supplemental oxygen: Secondary | ICD-10-CM | POA: Diagnosis not present

## 2016-07-16 DIAGNOSIS — G43909 Migraine, unspecified, not intractable, without status migrainosus: Secondary | ICD-10-CM | POA: Diagnosis not present

## 2016-07-16 DIAGNOSIS — F319 Bipolar disorder, unspecified: Secondary | ICD-10-CM | POA: Diagnosis not present

## 2016-07-16 DIAGNOSIS — I129 Hypertensive chronic kidney disease with stage 1 through stage 4 chronic kidney disease, or unspecified chronic kidney disease: Secondary | ICD-10-CM | POA: Diagnosis not present

## 2016-07-16 DIAGNOSIS — Z9181 History of falling: Secondary | ICD-10-CM | POA: Diagnosis not present

## 2016-07-16 DIAGNOSIS — Z7982 Long term (current) use of aspirin: Secondary | ICD-10-CM | POA: Diagnosis not present

## 2016-07-16 DIAGNOSIS — S43004D Unspecified dislocation of right shoulder joint, subsequent encounter: Secondary | ICD-10-CM | POA: Diagnosis not present

## 2016-07-16 DIAGNOSIS — M797 Fibromyalgia: Secondary | ICD-10-CM | POA: Diagnosis not present

## 2016-07-16 DIAGNOSIS — N183 Chronic kidney disease, stage 3 (moderate): Secondary | ICD-10-CM | POA: Diagnosis not present

## 2016-07-16 DIAGNOSIS — M48061 Spinal stenosis, lumbar region without neurogenic claudication: Secondary | ICD-10-CM | POA: Diagnosis not present

## 2016-07-16 DIAGNOSIS — J45909 Unspecified asthma, uncomplicated: Secondary | ICD-10-CM | POA: Diagnosis not present

## 2016-07-20 DIAGNOSIS — S43084D Other dislocation of right shoulder joint, subsequent encounter: Secondary | ICD-10-CM | POA: Diagnosis not present

## 2016-07-23 DIAGNOSIS — F319 Bipolar disorder, unspecified: Secondary | ICD-10-CM | POA: Diagnosis not present

## 2016-07-23 DIAGNOSIS — M48061 Spinal stenosis, lumbar region without neurogenic claudication: Secondary | ICD-10-CM | POA: Diagnosis not present

## 2016-07-23 DIAGNOSIS — Z9181 History of falling: Secondary | ICD-10-CM | POA: Diagnosis not present

## 2016-07-23 DIAGNOSIS — M797 Fibromyalgia: Secondary | ICD-10-CM | POA: Diagnosis not present

## 2016-07-23 DIAGNOSIS — I129 Hypertensive chronic kidney disease with stage 1 through stage 4 chronic kidney disease, or unspecified chronic kidney disease: Secondary | ICD-10-CM | POA: Diagnosis not present

## 2016-07-23 DIAGNOSIS — N183 Chronic kidney disease, stage 3 (moderate): Secondary | ICD-10-CM | POA: Diagnosis not present

## 2016-07-23 DIAGNOSIS — Z9981 Dependence on supplemental oxygen: Secondary | ICD-10-CM | POA: Diagnosis not present

## 2016-07-23 DIAGNOSIS — G43909 Migraine, unspecified, not intractable, without status migrainosus: Secondary | ICD-10-CM | POA: Diagnosis not present

## 2016-07-23 DIAGNOSIS — I712 Thoracic aortic aneurysm, without rupture: Secondary | ICD-10-CM | POA: Diagnosis not present

## 2016-07-23 DIAGNOSIS — S43004D Unspecified dislocation of right shoulder joint, subsequent encounter: Secondary | ICD-10-CM | POA: Diagnosis not present

## 2016-07-23 DIAGNOSIS — Z7982 Long term (current) use of aspirin: Secondary | ICD-10-CM | POA: Diagnosis not present

## 2016-07-23 DIAGNOSIS — J45909 Unspecified asthma, uncomplicated: Secondary | ICD-10-CM | POA: Diagnosis not present

## 2016-07-24 ENCOUNTER — Ambulatory Visit (INDEPENDENT_AMBULATORY_CARE_PROVIDER_SITE_OTHER): Payer: PPO | Admitting: *Deleted

## 2016-07-24 DIAGNOSIS — R55 Syncope and collapse: Secondary | ICD-10-CM | POA: Diagnosis not present

## 2016-07-26 ENCOUNTER — Telehealth: Payer: Self-pay | Admitting: Cardiology

## 2016-07-26 NOTE — Progress Notes (Signed)
Carelink Summary Report / Loop Recorder 

## 2016-07-26 NOTE — Telephone Encounter (Signed)
LMOVM requesting that pt send manual transmission b/c home monitor has not updated in at least 14 days.    

## 2016-07-30 DIAGNOSIS — S43004D Unspecified dislocation of right shoulder joint, subsequent encounter: Secondary | ICD-10-CM | POA: Diagnosis not present

## 2016-07-30 DIAGNOSIS — F319 Bipolar disorder, unspecified: Secondary | ICD-10-CM | POA: Diagnosis not present

## 2016-07-30 DIAGNOSIS — G43909 Migraine, unspecified, not intractable, without status migrainosus: Secondary | ICD-10-CM | POA: Diagnosis not present

## 2016-07-30 DIAGNOSIS — Z7982 Long term (current) use of aspirin: Secondary | ICD-10-CM | POA: Diagnosis not present

## 2016-07-30 DIAGNOSIS — N183 Chronic kidney disease, stage 3 (moderate): Secondary | ICD-10-CM | POA: Diagnosis not present

## 2016-07-30 DIAGNOSIS — J45909 Unspecified asthma, uncomplicated: Secondary | ICD-10-CM | POA: Diagnosis not present

## 2016-07-30 DIAGNOSIS — Z9981 Dependence on supplemental oxygen: Secondary | ICD-10-CM | POA: Diagnosis not present

## 2016-07-30 DIAGNOSIS — I129 Hypertensive chronic kidney disease with stage 1 through stage 4 chronic kidney disease, or unspecified chronic kidney disease: Secondary | ICD-10-CM | POA: Diagnosis not present

## 2016-07-30 DIAGNOSIS — I712 Thoracic aortic aneurysm, without rupture: Secondary | ICD-10-CM | POA: Diagnosis not present

## 2016-07-30 DIAGNOSIS — M797 Fibromyalgia: Secondary | ICD-10-CM | POA: Diagnosis not present

## 2016-07-30 DIAGNOSIS — M48061 Spinal stenosis, lumbar region without neurogenic claudication: Secondary | ICD-10-CM | POA: Diagnosis not present

## 2016-07-30 DIAGNOSIS — Z9181 History of falling: Secondary | ICD-10-CM | POA: Diagnosis not present

## 2016-08-01 LAB — CUP PACEART REMOTE DEVICE CHECK
Date Time Interrogation Session: 20180619204255
Implantable Pulse Generator Implant Date: 20180420

## 2016-08-03 ENCOUNTER — Encounter: Payer: Self-pay | Admitting: Cardiology

## 2016-08-06 DIAGNOSIS — M797 Fibromyalgia: Secondary | ICD-10-CM | POA: Diagnosis not present

## 2016-08-06 DIAGNOSIS — N183 Chronic kidney disease, stage 3 (moderate): Secondary | ICD-10-CM | POA: Diagnosis not present

## 2016-08-06 DIAGNOSIS — I129 Hypertensive chronic kidney disease with stage 1 through stage 4 chronic kidney disease, or unspecified chronic kidney disease: Secondary | ICD-10-CM | POA: Diagnosis not present

## 2016-08-06 DIAGNOSIS — Z9181 History of falling: Secondary | ICD-10-CM | POA: Diagnosis not present

## 2016-08-06 DIAGNOSIS — M48061 Spinal stenosis, lumbar region without neurogenic claudication: Secondary | ICD-10-CM | POA: Diagnosis not present

## 2016-08-06 DIAGNOSIS — Z7982 Long term (current) use of aspirin: Secondary | ICD-10-CM | POA: Diagnosis not present

## 2016-08-06 DIAGNOSIS — R55 Syncope and collapse: Secondary | ICD-10-CM | POA: Diagnosis not present

## 2016-08-06 DIAGNOSIS — G43909 Migraine, unspecified, not intractable, without status migrainosus: Secondary | ICD-10-CM | POA: Diagnosis not present

## 2016-08-06 DIAGNOSIS — R0902 Hypoxemia: Secondary | ICD-10-CM | POA: Diagnosis not present

## 2016-08-06 DIAGNOSIS — I712 Thoracic aortic aneurysm, without rupture: Secondary | ICD-10-CM | POA: Diagnosis not present

## 2016-08-06 DIAGNOSIS — F319 Bipolar disorder, unspecified: Secondary | ICD-10-CM | POA: Diagnosis not present

## 2016-08-06 DIAGNOSIS — Z9981 Dependence on supplemental oxygen: Secondary | ICD-10-CM | POA: Diagnosis not present

## 2016-08-06 DIAGNOSIS — S43004D Unspecified dislocation of right shoulder joint, subsequent encounter: Secondary | ICD-10-CM | POA: Diagnosis not present

## 2016-08-06 DIAGNOSIS — J45909 Unspecified asthma, uncomplicated: Secondary | ICD-10-CM | POA: Diagnosis not present

## 2016-08-09 DIAGNOSIS — J45909 Unspecified asthma, uncomplicated: Secondary | ICD-10-CM | POA: Diagnosis not present

## 2016-08-09 DIAGNOSIS — I129 Hypertensive chronic kidney disease with stage 1 through stage 4 chronic kidney disease, or unspecified chronic kidney disease: Secondary | ICD-10-CM | POA: Diagnosis not present

## 2016-08-09 DIAGNOSIS — R0902 Hypoxemia: Secondary | ICD-10-CM | POA: Diagnosis not present

## 2016-08-09 DIAGNOSIS — R55 Syncope and collapse: Secondary | ICD-10-CM | POA: Diagnosis not present

## 2016-08-14 DIAGNOSIS — M48061 Spinal stenosis, lumbar region without neurogenic claudication: Secondary | ICD-10-CM | POA: Diagnosis not present

## 2016-08-14 DIAGNOSIS — S43004D Unspecified dislocation of right shoulder joint, subsequent encounter: Secondary | ICD-10-CM | POA: Diagnosis not present

## 2016-08-14 DIAGNOSIS — J45909 Unspecified asthma, uncomplicated: Secondary | ICD-10-CM | POA: Diagnosis not present

## 2016-08-14 DIAGNOSIS — Z9981 Dependence on supplemental oxygen: Secondary | ICD-10-CM | POA: Diagnosis not present

## 2016-08-14 DIAGNOSIS — I129 Hypertensive chronic kidney disease with stage 1 through stage 4 chronic kidney disease, or unspecified chronic kidney disease: Secondary | ICD-10-CM | POA: Diagnosis not present

## 2016-08-14 DIAGNOSIS — M797 Fibromyalgia: Secondary | ICD-10-CM | POA: Diagnosis not present

## 2016-08-14 DIAGNOSIS — Z9181 History of falling: Secondary | ICD-10-CM | POA: Diagnosis not present

## 2016-08-14 DIAGNOSIS — F319 Bipolar disorder, unspecified: Secondary | ICD-10-CM | POA: Diagnosis not present

## 2016-08-14 DIAGNOSIS — Z7982 Long term (current) use of aspirin: Secondary | ICD-10-CM | POA: Diagnosis not present

## 2016-08-14 DIAGNOSIS — G43909 Migraine, unspecified, not intractable, without status migrainosus: Secondary | ICD-10-CM | POA: Diagnosis not present

## 2016-08-14 DIAGNOSIS — N183 Chronic kidney disease, stage 3 (moderate): Secondary | ICD-10-CM | POA: Diagnosis not present

## 2016-08-14 DIAGNOSIS — I712 Thoracic aortic aneurysm, without rupture: Secondary | ICD-10-CM | POA: Diagnosis not present

## 2016-08-20 ENCOUNTER — Encounter: Payer: Self-pay | Admitting: *Deleted

## 2016-08-23 ENCOUNTER — Ambulatory Visit (INDEPENDENT_AMBULATORY_CARE_PROVIDER_SITE_OTHER): Payer: PPO | Admitting: *Deleted

## 2016-08-23 DIAGNOSIS — R55 Syncope and collapse: Secondary | ICD-10-CM

## 2016-08-28 ENCOUNTER — Ambulatory Visit: Payer: PPO | Attending: Orthopedic Surgery

## 2016-08-28 NOTE — Progress Notes (Signed)
Carelink Summary Report / Loop Recorder 

## 2016-08-29 ENCOUNTER — Ambulatory Visit (INDEPENDENT_AMBULATORY_CARE_PROVIDER_SITE_OTHER): Payer: PPO | Admitting: Internal Medicine

## 2016-08-29 ENCOUNTER — Encounter: Payer: Self-pay | Admitting: Internal Medicine

## 2016-08-29 VITALS — BP 136/90 | HR 66 | Ht 63.0 in | Wt 267.2 lb

## 2016-08-29 DIAGNOSIS — R0689 Other abnormalities of breathing: Secondary | ICD-10-CM | POA: Diagnosis not present

## 2016-08-29 DIAGNOSIS — R059 Cough, unspecified: Secondary | ICD-10-CM

## 2016-08-29 DIAGNOSIS — R05 Cough: Secondary | ICD-10-CM

## 2016-08-29 DIAGNOSIS — R0600 Dyspnea, unspecified: Secondary | ICD-10-CM

## 2016-08-29 DIAGNOSIS — R0902 Hypoxemia: Secondary | ICD-10-CM

## 2016-08-29 NOTE — Patient Instructions (Addendum)
ICD-10-CM   1. Dyspnea and respiratory abnormalities R06.00    R06.89   2. Cough R05   3. Hypoxemia R09.02    Symptoms and low o2 level history remain unexplained Though cough can be related to lisinopril treatment  Plan - do ONO test on room air; - do HRCT supine and prone  - refer sleep doc in our office  Followup - next few weeks but after completing test to see my NP for result review and consideration of dc lisinopril

## 2016-08-29 NOTE — Progress Notes (Signed)
Subjective:     Patient ID: Lindsey Rowe, female   DOB: [birth year], 71 y.o.   MRN: [id number]  PCP Maurice SmallGriffin, Elaine, MD   HPI  Ludger NuttingIOV 08/29/2016  Chief Complaint  Patient presents with  . Pulmonary Consult    Referred by Dr. Maurice SmallElaine Griffin for hypoxia, Pt states her breathing is doing ok, she has some sob when she does some house hold chores, here to discuss if she needs oxygen for sure,slight cough but non productive Denies chest tigtness     71 morbidly obese female without any history of smoking has been referred for hypoxemia. History is somewhat complicated and is based on her history and review of the charts. As best as I can gather she reports lifelong history of stable dyspnea and exertion class II-III levels relieved by rest. She says this is chronic and month a year and stable. She attributes this to her morbid obesity. In addition she has a mild dry hacking cough that has been present for many years and is stable. Then in August 2017 she's had one episode of unpredictable syncope. This was followed by another episode of syncope in November 2017. The third one in February 2018 for which she was admitted. This happened while she was cooking dinner. At the sent home embolism was ruled out she did have a CT scan of the chest that showed clear lung fields is documented below. I personally visualized the CT scan image. She also had echocardiogram that showed grade 2 diastolic dysfunction consistent with obese phenotype. Apparently she was noticed to be hypoxemic according to the outside chart which was not given oxygen. Then when she followed up with primary care physician she was found to be hypoxemic. This the first time she's been hypoxemic [in the past in 2014 she underwent back surgery and did not have any anesthetic problems or hypoxemia documented]. Then approximately in end of March 2018/early April 2080 she was started on 2 L continuous oxygen by by her primary care physician. Since then  she's not had any syncope but she thinks the syncope and hypoxemia unrelated. She is puzzled by her hypoxemia. She admits to loud snoring as observed by her friends will stay with her at home but denies any excessive daytime somnolence. No prior diagnosis of OSA.  In terms of hypoxemia she tells me that she can be off oxygen for 4 hours and time and oxygen for several hours in drop. The some unpredictability of this. Walking desaturation test 185 feet 3 laps on room air using walker and forehead probe: Walking desaturation test on 08/29/2016 185 feet x 3 laps on RA - aftger being on RA for 10 minutes:  did NOT desaturate. Rest pulse ox was 94%, final pulse ox was 90%. HR response 65/min at rest to 103/min at peak exertion.   Echocardiogram February 2018 Impressions:  - Normal LV size with EF 60-65%. Moderate diastolic dysfunction.   Normal RV size and systolic function. No significant valvular   abnormalities.  IMPRESSION:CT angiogram chest 03/24/2016 1. No CT evidence for acute pulmonary embolism. 2. No other acute cardiopulmonary abnormality identified. 3. 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   Electronically Signed   By: Rise MuBenjamin  McClintock M.D.   On: 03/24/2016 03:52..     Results for Lindsey DupesERRY, Akanksha C (MRN [id number]) as of 08/29/2016 11:10  Ref. Range 04/10/2016 19:50 05/24/2016 00:55 05/24/2016 03:19 05/24/2016 11:42 05/25/2016 07:04  Creatinine Latest Ref Range: 0.44 - 1.00 mg/dL 2.950.99  1.05 (H)   0.90   Results for LOUISE, RAWSON (MRN [id number]) as of 08/29/2016 11:10  Ref. Range 04/10/2016 19:50 05/24/2016 00:55 05/24/2016 03:19 05/24/2016 11:42 05/25/2016 07:04  Hemoglobin Latest Ref Range: 12.0 - 15.0 g/dL 40.9 81.1 (L)   91.4 (L)     has a past medical history of Arthritis; Back pain; Bipolar 1 disorder (HCC); Complication of anesthesia; Depression; GERD (gastroesophageal reflux disease); Headache(784.0); Hyperlipidemia; Hypertension; Hypothyroidism; and  Thyroid disease.   reports that she has never smoked. She has never used smokeless tobacco.  Past Surgical History:  Procedure Laterality Date  . APPENDECTOMY    . BACK SURGERY    . EYE SURGERY Bilateral    lens implant  . GASTRIC BYPASS    . LOOP RECORDER INSERTION N/A 05/25/2016   Procedure: Loop Recorder Insertion;  Surgeon: Will Jorja Loa, MD;  Location: [facility];  Service: Cardiovascular;  Laterality: N/A;  . LUMBAR LAMINECTOMY/DECOMPRESSION MICRODISCECTOMY Right 07/23/2012   Procedure: LUMBAR LAMINECTOMY/DECOMPRESSION MICRODISCECTOMY 2 LEVELS;  Surgeon: Karn Cassis, MD;  Location: [facility];  Service: Neurosurgery;  Laterality: Right;  Right Lumbar three-four  lumbar four-five Laminectomy/Foraminotomy  . NASAL SINUS SURGERY Bilateral   . TONSILLECTOMY    . WRIST SURGERY      Allergies  Allergen Reactions  . Gabapentin Other (See Comments)    Right foot and leg swelled   . Cefadroxil Itching    Ends of hair itched     Immunization History  Administered Date(s) Administered  . Influenza Whole 11/17/2008, 12/26/2009  . Influenza-Unspecified 10/07/2015  . Td 03/11/2009    Family History  Problem Relation Age of Onset  . Heart disease Mother   . Emphysema Father   . Diabetes Sister   . Other Brother        Brain tumor - s/p excision     Current Outpatient Prescriptions:  .  Aspirin-Salicylamide-Caffeine (BC HEADACHE POWDER PO), Take 1 packet by mouth as needed (headache, pain)., Disp: , Rfl:  .  Cholecalciferol (D3-1000) 1000 units capsule, Take 1,000 Units by mouth daily., Disp: , Rfl:  .  DULoxetine (CYMBALTA) 30 MG capsule, Take 1 capsule (30 mg total) by mouth 2 (two) times daily., Disp: 60 capsule, Rfl: 0 .  levothyroxine (SYNTHROID, LEVOTHROID) 75 MCG tablet, Take 75 mcg by mouth daily before breakfast. , Disp: , Rfl:  .  lisinopril (PRINIVIL,ZESTRIL) 10 MG tablet, , Disp: , Rfl:  .  meloxicam (MOBIC) 15 MG tablet, Take 15 mg by mouth  daily. , Disp: , Rfl:  .  metoprolol succinate (TOPROL-XL) 25 MG 24 hr tablet, , Disp: , Rfl:  .  omeprazole (PRILOSEC) 20 MG capsule, Take 20 mg by mouth 2 (two) times daily before a meal., Disp: , Rfl:  .  pravastatin (PRAVACHOL) 20 MG tablet, Take 20 mg by mouth at bedtime. , Disp: , Rfl:  .  pregabalin (LYRICA) 100 MG capsule, Take 1 capsule (100 mg total) by mouth 2 (two) times daily., Disp: , Rfl:  .  SUMAtriptan (IMITREX) 100 MG tablet, Take 100 mg by mouth as needed for headache. Take 100mg  at onset of headache, may repeat in 2 hours if needed. Do not exceed 2 doses in 24 hours., Disp: , Rfl:  .  traZODone (DESYREL) 100 MG tablet, Take 300 mg by mouth at bedtime. , Disp: , Rfl:  .  vitamin B-12 (CYANOCOBALAMIN) 1000 MCG tablet, Take 1,000 mcg by mouth daily., Disp: , Rfl:  .  fludrocortisone (  FLORINEF) 0.1 MG tablet, Take 1 tablet (0.1 mg total) by mouth 2 (two) times daily. (Patient not taking: Reported on 08/29/2016), Disp: 60 tablet, Rfl: 0 .  methocarbamol (ROBAXIN) 500 MG tablet, Take 1 tablet (500 mg total) by mouth every 6 (six) hours as needed for muscle spasms. (Patient not taking: Reported on 08/29/2016), Disp: 60 tablet, Rfl: 0 .  oxyCODONE (OXY IR/ROXICODONE) 5 MG immediate release tablet, Take 1 tablet (5 mg total) by mouth every 6 (six) hours as needed for severe pain. (Patient not taking: Reported on 08/29/2016), Disp: 20 tablet, Rfl: 0    Review of Systems     Objective:   Physical Exam  Constitutional: She is oriented to person, place, and time. She appears well-developed and well-nourished. No distress.  obese  HENT:  Head: Normocephalic and atraumatic.  Right Ear: External ear normal.  Left Ear: External ear normal.  Mouth/Throat: Oropharynx is clear and moist. No oropharyngeal exudate.  mallampatii class 3  Eyes: Pupils are equal, round, and reactive to light. Conjunctivae and EOM are normal. Right eye exhibits no discharge. Left eye exhibits no discharge. No  scleral icterus.  Neck: Normal range of motion. Neck supple. No JVD present. No tracheal deviation present. No thyromegaly present.  Cardiovascular: Normal rate, regular rhythm, normal heart sounds and intact distal pulses.  Exam reveals no gallop and no friction rub.   No murmur heard. Pulmonary/Chest: Effort normal and breath sounds normal. No respiratory distress. She has no wheezes. She has no rales. She exhibits no tenderness.  Overall dimished air entry  Abdominal: Soft. Bowel sounds are normal. She exhibits no distension and no mass. There is no tenderness. There is no rebound and no guarding.  Musculoskeletal: Normal range of motion. She exhibits no edema or tenderness.  Chronic OA  Lymphadenopathy:    She has no cervical adenopathy.  Neurological: She is alert and oriented to person, place, and time. She has normal reflexes. No cranial nerve deficit. She exhibits normal muscle tone. Coordination normal.  Skin: Skin is warm and dry. No rash noted. She is not diaphoretic. No erythema. No pallor.  Psychiatric: She has a normal mood and affect. Her behavior is normal. Judgment and thought content normal.  Vitals reviewed.  Vitals:   08/29/16 1110  BP: 136/90  Pulse: 66  SpO2: 92%  Weight: 267 lb 3.2 oz (121.2 kg)  Height: 5\' 3"  (1.6 m)    Estimated body mass index is 47.33 kg/m as calculated from the following:   Height as of this encounter: 5\' 3"  (1.6 m).   Weight as of this encounter: 267 lb 3.2 oz (121.2 kg).     Assessment:       ICD-10-CM   1. Dyspnea and respiratory abnormalities R06.00    R06.89   2. Cough R05   3. Hypoxemia R09.02        Plan:      Symptoms and low o2 level history remain unexplained Though cough can be related to lisinopril treatment and symptioms could be related to diastolic dysfunction Need to rule out Sleep apnea, pulmonary fibrosis  Plan - do ONO test on room air; - do HRCT supine and prone  - refer sleep doc in our  office  Followup - next few weeks but after completing test to see my NP for result review and consideration of dc lisinopril   Dr. Kalman Shan, M.D., [facility].C.P Pulmonary and Critical Care Medicine Staff Physician [facility]  Pulmonary and Critical Care Pager:  [phone], If no answer or between  15:00h - 7:00h: call [phone]  08/29/2016 11:42 AM

## 2016-09-03 ENCOUNTER — Encounter: Payer: PPO | Admitting: Cardiology

## 2016-09-06 DIAGNOSIS — R55 Syncope and collapse: Secondary | ICD-10-CM | POA: Diagnosis not present

## 2016-09-06 DIAGNOSIS — R0902 Hypoxemia: Secondary | ICD-10-CM | POA: Diagnosis not present

## 2016-09-06 DIAGNOSIS — I129 Hypertensive chronic kidney disease with stage 1 through stage 4 chronic kidney disease, or unspecified chronic kidney disease: Secondary | ICD-10-CM | POA: Diagnosis not present

## 2016-09-06 DIAGNOSIS — J45909 Unspecified asthma, uncomplicated: Secondary | ICD-10-CM | POA: Diagnosis not present

## 2016-09-07 LAB — CUP PACEART REMOTE DEVICE CHECK
Date Time Interrogation Session: 20180720074956
Implantable Pulse Generator Implant Date: 20180420

## 2016-09-09 DIAGNOSIS — R55 Syncope and collapse: Secondary | ICD-10-CM | POA: Diagnosis not present

## 2016-09-09 DIAGNOSIS — R0902 Hypoxemia: Secondary | ICD-10-CM | POA: Diagnosis not present

## 2016-09-09 DIAGNOSIS — J45909 Unspecified asthma, uncomplicated: Secondary | ICD-10-CM | POA: Diagnosis not present

## 2016-09-09 DIAGNOSIS — I129 Hypertensive chronic kidney disease with stage 1 through stage 4 chronic kidney disease, or unspecified chronic kidney disease: Secondary | ICD-10-CM | POA: Diagnosis not present

## 2016-09-12 ENCOUNTER — Inpatient Hospital Stay: Admission: RE | Admit: 2016-09-12 | Payer: PPO | Source: Ambulatory Visit

## 2016-09-14 DIAGNOSIS — E7800 Pure hypercholesterolemia, unspecified: Secondary | ICD-10-CM | POA: Diagnosis not present

## 2016-09-14 DIAGNOSIS — Z1389 Encounter for screening for other disorder: Secondary | ICD-10-CM | POA: Diagnosis not present

## 2016-09-14 DIAGNOSIS — M4800 Spinal stenosis, site unspecified: Secondary | ICD-10-CM | POA: Diagnosis not present

## 2016-09-14 DIAGNOSIS — Z0000 Encounter for general adult medical examination without abnormal findings: Secondary | ICD-10-CM | POA: Diagnosis not present

## 2016-09-14 DIAGNOSIS — M797 Fibromyalgia: Secondary | ICD-10-CM | POA: Diagnosis not present

## 2016-09-14 DIAGNOSIS — G43909 Migraine, unspecified, not intractable, without status migrainosus: Secondary | ICD-10-CM | POA: Diagnosis not present

## 2016-09-14 DIAGNOSIS — N183 Chronic kidney disease, stage 3 (moderate): Secondary | ICD-10-CM | POA: Diagnosis not present

## 2016-09-14 DIAGNOSIS — F319 Bipolar disorder, unspecified: Secondary | ICD-10-CM | POA: Diagnosis not present

## 2016-09-14 DIAGNOSIS — M1990 Unspecified osteoarthritis, unspecified site: Secondary | ICD-10-CM | POA: Diagnosis not present

## 2016-09-14 DIAGNOSIS — I129 Hypertensive chronic kidney disease with stage 1 through stage 4 chronic kidney disease, or unspecified chronic kidney disease: Secondary | ICD-10-CM | POA: Diagnosis not present

## 2016-09-14 DIAGNOSIS — J9691 Respiratory failure, unspecified with hypoxia: Secondary | ICD-10-CM | POA: Diagnosis not present

## 2016-09-14 DIAGNOSIS — E039 Hypothyroidism, unspecified: Secondary | ICD-10-CM | POA: Diagnosis not present

## 2016-09-24 ENCOUNTER — Ambulatory Visit (INDEPENDENT_AMBULATORY_CARE_PROVIDER_SITE_OTHER): Payer: PPO | Admitting: *Deleted

## 2016-09-24 ENCOUNTER — Ambulatory Visit (INDEPENDENT_AMBULATORY_CARE_PROVIDER_SITE_OTHER): Payer: PPO | Admitting: Adult Health

## 2016-09-24 ENCOUNTER — Encounter: Payer: Self-pay | Admitting: Adult Health

## 2016-09-24 DIAGNOSIS — R55 Syncope and collapse: Secondary | ICD-10-CM | POA: Diagnosis not present

## 2016-09-24 DIAGNOSIS — E662 Morbid (severe) obesity with alveolar hypoventilation: Secondary | ICD-10-CM

## 2016-09-24 NOTE — Progress Notes (Signed)
Patient not seen today - test results were not available to discuss.  Appt rescheduled for 8.30.18.  Boone Master, [facility] 09/24/16

## 2016-09-25 ENCOUNTER — Ambulatory Visit (INDEPENDENT_AMBULATORY_CARE_PROVIDER_SITE_OTHER)
Admission: RE | Admit: 2016-09-25 | Discharge: 2016-09-25 | Disposition: A | Discharge status: Home / Self Care | Payer: PPO | Source: Ambulatory Visit | Attending: Internal Medicine | Admitting: Internal Medicine

## 2016-09-25 DIAGNOSIS — R0689 Other abnormalities of breathing: Secondary | ICD-10-CM

## 2016-09-25 DIAGNOSIS — R0600 Dyspnea, unspecified: Secondary | ICD-10-CM | POA: Diagnosis not present

## 2016-09-25 DIAGNOSIS — R0902 Hypoxemia: Secondary | ICD-10-CM | POA: Diagnosis not present

## 2016-09-25 NOTE — Progress Notes (Signed)
Carelink Summary Report / Loop Recorder 

## 2016-09-26 ENCOUNTER — Telehealth: Payer: Self-pay | Admitting: Cardiology

## 2016-09-26 NOTE — Telephone Encounter (Signed)
Attempted to call pt to request a manual transmission b/c her home monitor has not updated in at least 14 days. No answer and unable to leave message b/c mailbox was full.

## 2016-09-28 LAB — CUP PACEART REMOTE DEVICE CHECK
Date Time Interrogation Session: 20180819123917
Implantable Pulse Generator Implant Date: 20180420

## 2016-10-01 ENCOUNTER — Encounter: Payer: Self-pay | Admitting: Cardiology

## 2016-10-01 ENCOUNTER — Telehealth: Payer: Self-pay | Admitting: Adult Health

## 2016-10-01 ENCOUNTER — Ambulatory Visit (INDEPENDENT_AMBULATORY_CARE_PROVIDER_SITE_OTHER): Payer: PPO | Admitting: Cardiology

## 2016-10-01 VITALS — BP 130/80 | HR 71 | Ht 63.5 in | Wt 266.0 lb

## 2016-10-01 DIAGNOSIS — E782 Mixed hyperlipidemia: Secondary | ICD-10-CM

## 2016-10-01 DIAGNOSIS — R55 Syncope and collapse: Secondary | ICD-10-CM | POA: Diagnosis not present

## 2016-10-01 DIAGNOSIS — I10 Essential (primary) hypertension: Secondary | ICD-10-CM

## 2016-10-01 LAB — CUP PACEART INCLINIC DEVICE CHECK
Date Time Interrogation Session: 20180827155849
Implantable Pulse Generator Implant Date: 20180420

## 2016-10-01 NOTE — Telephone Encounter (Signed)
Contacted [employer] about scheduling ONO, spoke with Mardelle Matte, ONO schedule for 10/02/16.  ATC patient, voicemail full, [facility]

## 2016-10-01 NOTE — Progress Notes (Signed)
Electrophysiology Office Note   Date:  10/01/2016   ID:  TEXAS ZOGG, DOB [birth year], MRN [id number]  PCP:  Maurice Small, MD  Cardiologist:  Tresa Endo Primary Electrophysiologist:  Ehan Freas Jorja Loa, MD    Chief Complaint  Patient presents with  . Pacemaker Check    Syncope/Tachycardia-Bradycardia     History of Present Illness: Lindsey Rowe is a 71 y.o. female who is being seen today for the evaluation of syncope at the request of Maurice Small, MD. Presenting today for electrophysiology evaluation. She had an episode of syncope and presented to the hospital with a right shoulder dislocation. She had had multiple episodes of syncope and thus a Linq monitor was implanted. Since that time, she is felt well without complaint.    Today, she denies symptoms of palpitations, chest pain, shortness of breath, orthopnea, PND, lower extremity edema, claudication, dizziness, presyncope, syncope, bleeding, or neurologic sequela. The patient is tolerating medications without difficulties.    Past Medical History:  Diagnosis Date  . Arthritis   . Back pain   . Bipolar 1 disorder (HCC)   . Complication of anesthesia    hard to wake up anesthesia  . Depression   . GERD (gastroesophageal reflux disease)   . Headache(784.0)    migraines and tension headaches  . Hyperlipidemia   . Hypertension   . Hypothyroidism   . Thyroid disease    Past Surgical History:  Procedure Laterality Date  . APPENDECTOMY    . BACK SURGERY    . EYE SURGERY Bilateral    lens implant  . GASTRIC BYPASS    . LOOP RECORDER INSERTION N/A 05/25/2016   Procedure: Loop Recorder Insertion;  Surgeon: Zidan Helget Jorja Loa, MD;  Location: [facility];  Service: Cardiovascular;  Laterality: N/A;  . LUMBAR LAMINECTOMY/DECOMPRESSION MICRODISCECTOMY Right 07/23/2012   Procedure: LUMBAR LAMINECTOMY/DECOMPRESSION MICRODISCECTOMY 2 LEVELS;  Surgeon: Karn Cassis, MD;  Location: [facility];  Service:  Neurosurgery;  Laterality: Right;  Right Lumbar three-four  lumbar four-five Laminectomy/Foraminotomy  . NASAL SINUS SURGERY Bilateral   . TONSILLECTOMY    . WRIST SURGERY       Current Outpatient Prescriptions  Medication Sig Dispense Refill  . Aspirin-Salicylamide-Caffeine (BC HEADACHE POWDER PO) Take 1 packet by mouth as needed (headache, pain).    . Cholecalciferol (D3-1000) 1000 units capsule Take 1,000 Units by mouth daily.    . DULoxetine (CYMBALTA) 30 MG capsule Take 1 capsule (30 mg total) by mouth 2 (two) times daily. 60 capsule 0  . levothyroxine (SYNTHROID, LEVOTHROID) 75 MCG tablet Take 75 mcg by mouth daily before breakfast.     . lisinopril (PRINIVIL,ZESTRIL) 10 MG tablet     . meloxicam (MOBIC) 15 MG tablet Take 15 mg by mouth daily.     . methocarbamol (ROBAXIN) 500 MG tablet Take 1 tablet (500 mg total) by mouth every 6 (six) hours as needed for muscle spasms. 60 tablet 0  . metoprolol succinate (TOPROL-XL) 25 MG 24 hr tablet     . omeprazole (PRILOSEC) 20 MG capsule Take 20 mg by mouth 2 (two) times daily before a meal.    . oxyCODONE (OXY IR/ROXICODONE) 5 MG immediate release tablet Take 1 tablet (5 mg total) by mouth every 6 (six) hours as needed for severe pain. 20 tablet 0  . pravastatin (PRAVACHOL) 20 MG tablet Take 20 mg by mouth at bedtime.     . pregabalin (LYRICA) 100 MG capsule Take 1 capsule (100 mg total)  by mouth 2 (two) times daily.    . SUMAtriptan (IMITREX) 100 MG tablet Take 100 mg by mouth as needed for headache. Take 100mg  at onset of headache, may repeat in 2 hours if needed. Do not exceed 2 doses in 24 hours.    . traZODone (DESYREL) 100 MG tablet Take 300 mg by mouth at bedtime.     . vitamin B-12 (CYANOCOBALAMIN) 1000 MCG tablet Take 1,000 mcg by mouth daily.     No current facility-administered medications for this visit.     Allergies:   Gabapentin and Cefadroxil   Social History:  The patient  reports that she has never smoked. She has never  used smokeless tobacco. She reports that she drinks alcohol. She reports that she does not use drugs.   Family History:  The patient's family history includes Diabetes in her sister; Emphysema in her father; Heart disease in her mother; Other in her brother.    ROS:  Please see the history of present illness.   Otherwise, review of systems is positive for none.   All other systems are reviewed and negative.    PHYSICAL EXAM: VS:  BP 130/80   Pulse 71   Ht 5' 3.5" (1.613 m)   Wt 266 lb (120.7 kg)   BMI 46.38 kg/m  , BMI Body mass index is 46.38 kg/m. GEN: Well nourished, well developed, in no acute distress  HEENT: normal  Neck: no JVD, carotid bruits, or masses Cardiac: RRR; no murmurs, rubs, or gallops,no edema  Respiratory:  clear to auscultation bilaterally, normal work of breathing GI: soft, nontender, nondistended, + BS MS: no deformity or atrophy  Skin: warm and dry,  device pocket is well healed Neuro:  Strength and sensation are intact Psych: euthymic mood, full affect  EKG:  EKG is not ordered today. Personal review of the ekg ordered 05/25/16 shows SR   Device interrogation is reviewed today in detail.  See PaceArt for details.   Recent Labs: 03/24/2016: TSH 3.785 05/24/2016: ALT 12 05/25/2016: BUN 20; Creatinine, Ser 0.90; Hemoglobin 10.7; Platelets 172; Potassium 4.3; Sodium 140    Lipid Panel     Component Value Date/Time   CHOL 196 11/22/2009 2149   TRIG 156 (H) 11/22/2009 2149   HDL 45 11/22/2009 2149   CHOLHDL 4.4 Ratio 11/22/2009 2149   VLDL 31 11/22/2009 2149   LDLCALC 120 (H) 11/22/2009 2149     Wt Readings from Last 3 Encounters:  10/01/16 266 lb (120.7 kg)  08/29/16 267 lb 3.2 oz (121.2 kg)  05/26/16 267 lb 3.2 oz (121.2 kg)      Other studies Reviewed: Additional studies/ records that were reviewed today include: TTE 03/24/16  Review of the above records today demonstrates:  - Left ventricle: The cavity size was normal. Wall thickness  was   normal. Systolic function was normal. The estimated ejection   fraction was in the range of 60% to 65%. Wall motion was normal;   there were no regional wall motion abnormalities. Features are   consistent with a pseudonormal left ventricular filling pattern,   with concomitant abnormal relaxation and increased filling   pressure (grade 2 diastolic dysfunction). - Aortic valve: There was no stenosis. - Mitral valve: There was no significant regurgitation. - Right ventricle: The cavity size was normal. Systolic function   was normal. - Tricuspid valve: Peak RV-RA gradient (S): 28 mm Hg. - Pulmonary arteries: PA peak pressure: 31 mm Hg (S). - Inferior vena cava: The vessel  was normal in size. The   respirophasic diameter changes were in the normal range (>= 50%),   consistent with normal central venous pressure.   ASSESSMENT AND PLAN:  1.  Recurrent syncope: Linq monitor implanted. Has had right shoulder dislocated as a result of her most recent episode. Linq monitor shows no evidence of arrhythmia. Continue monitoring.  2. Hypertension: Blood pressure well controlled today  3. Hyperlipidemia: Continue pravastatin  Current medicines are reviewed at length with the patient today.   The patient does not have concerns regarding her medicines.  The following changes were made today:  none  Labs/ tests ordered today include:  No orders of the defined types were placed in this encounter.    Disposition:   FU with Marquez Ceesay pending LINQ results   Signed, Maye Parkinson Jorja Loa, MD  10/01/2016 2:53 PM     [facility] [address] [place] [address] [phone] (office) [phone] (fax)

## 2016-10-01 NOTE — Patient Instructions (Signed)
Medication Instructions:  Your physician recommends that you continue on your current medications as directed. Please refer to the Current Medication list given to you today.  If you need a refill on your cardiac medications before your next appointment, please call your pharmacy.   Labwork: None ordered  Testing/Procedures: None ordered  Follow-Up: No follow up is needed at this time with Dr. Elberta Fortis.  He will see you on an as needed basis.  You will continue your monthly LINQ monitor checks.   Thank you for choosing [facility]!!   Dory Horn, RN [phone]

## 2016-10-02 ENCOUNTER — Telehealth: Payer: Self-pay | Admitting: Internal Medicine

## 2016-10-02 NOTE — Telephone Encounter (Signed)
Let [name] know tht CT lungs are clear. She has raised hemidioaphragm on right that can contribute to dyspnea  Plan  - definite ONO  - see phone note from office having difficulty getting it scheduled - snift test - app visit but ensure sniff test is before app visit   Dr. Kalman Shan, M.D., [facility].C.P Pulmonary and Critical Care Medicine Staff Physician [facility] [place] Pulmonary and Critical Care Pager: [phone], If no answer or between  15:00h - 7:00h: call [phone]  10/02/2016 2:32 AM

## 2016-10-02 NOTE — Assessment & Plan Note (Signed)
ERROR

## 2016-10-03 NOTE — Telephone Encounter (Signed)
ATC pt- unable to leave vm due to mailbox being full.  Will call back 

## 2016-10-04 ENCOUNTER — Telehealth: Payer: Self-pay | Admitting: Pulmonary Disease

## 2016-10-04 ENCOUNTER — Encounter: Payer: Self-pay | Admitting: Pulmonary Disease

## 2016-10-04 ENCOUNTER — Ambulatory Visit (INDEPENDENT_AMBULATORY_CARE_PROVIDER_SITE_OTHER): Payer: PPO | Admitting: Pulmonary Disease

## 2016-10-04 ENCOUNTER — Other Ambulatory Visit: Payer: Self-pay | Admitting: Pulmonary Disease

## 2016-10-04 ENCOUNTER — Ambulatory Visit (INDEPENDENT_AMBULATORY_CARE_PROVIDER_SITE_OTHER)
Admission: RE | Admit: 2016-10-04 | Discharge: 2016-10-04 | Disposition: A | Discharge status: Home / Self Care | Payer: PPO | Source: Ambulatory Visit | Attending: Pulmonary Disease | Admitting: Pulmonary Disease

## 2016-10-04 VITALS — BP 130/80 | HR 60 | Ht 63.5 in | Wt 268.8 lb

## 2016-10-04 DIAGNOSIS — J986 Disorders of diaphragm: Secondary | ICD-10-CM

## 2016-10-04 DIAGNOSIS — R0600 Dyspnea, unspecified: Secondary | ICD-10-CM | POA: Diagnosis not present

## 2016-10-04 DIAGNOSIS — G4733 Obstructive sleep apnea (adult) (pediatric): Secondary | ICD-10-CM

## 2016-10-04 DIAGNOSIS — R0689 Other abnormalities of breathing: Secondary | ICD-10-CM

## 2016-10-04 NOTE — Telephone Encounter (Signed)
ATC patient, mailbox full.  wcb 

## 2016-10-04 NOTE — Progress Notes (Signed)
[place] [facility]    Chief Complaint  Patient presents with  . Follow-up    Pt had a CT scan done 8/21 and a split night sleep study done 8/28 and 8/29 and is here to find out the results from both tests. Pt states that she has been doing good since last visit. Denies any SOB, cough, or CP. DME: [facility], 2L O2 24/7.     Current Outpatient Prescriptions on File Prior to Visit  Medication Sig  . Aspirin-Salicylamide-Caffeine (BC HEADACHE POWDER PO) Take 1 packet by mouth as needed (headache, pain).  . Cholecalciferol (D3-1000) 1000 units capsule Take 1,000 Units by mouth daily.  . DULoxetine (CYMBALTA) 30 MG capsule Take 1 capsule (30 mg total) by mouth 2 (two) times daily.  [name] levothyroxine (SYNTHROID, LEVOTHROID) 75 MCG tablet Take 75 mcg by mouth daily before breakfast.   . lisinopril (PRINIVIL,ZESTRIL) 10 MG tablet   . meloxicam (MOBIC) 15 MG tablet Take 15 mg by mouth daily.   . metoprolol succinate (TOPROL-XL) 25 MG 24 hr tablet   . omeprazole (PRILOSEC) 20 MG capsule Take 20 mg by mouth 2 (two) times daily before a meal.  . pravastatin (PRAVACHOL) 20 MG tablet Take 20 mg by mouth at bedtime.   . pregabalin (LYRICA) 100 MG capsule Take 1 capsule (100 mg total) by mouth 2 (two) times daily.  . SUMAtriptan (IMITREX) 100 MG tablet Take 100 mg by mouth as needed for headache. Take 100mg  at onset of headache, may repeat in 2 hours if needed. Do not exceed 2 doses in 24 hours.  . traZODone (DESYREL) 100 MG tablet Take 300 mg by mouth at bedtime.   . vitamin B-12 (CYANOCOBALAMIN) 1000 MCG tablet Take 1,000 mcg by mouth daily.  . methocarbamol (ROBAXIN) 500 MG tablet Take 1 tablet (500 mg total) by mouth every 6 (six) hours as needed for muscle spasms.  [name] oxyCODONE (OXY IR/ROXICODONE) 5 MG immediate release tablet Take 1 tablet (5 mg total) by mouth every 6 (six) hours as needed for severe pain. (Patient not taking: Reported on 10/04/2016)   No current facility-administered medications on file  prior to visit.      Past Medical Hx:  has a past medical history of Arthritis; Back pain; Bipolar 1 disorder (HCC); Complication of anesthesia; Depression; GERD (gastroesophageal reflux disease); Headache(784.0); Hyperlipidemia; Hypertension; Hypothyroidism; and Thyroid disease.   Past Surgical hx, Allergies, Family hx, Social hx all reviewed.  Vital Signs BP 130/80 (BP Location: Left Arm, Cuff Size: Large)   Pulse 60   Ht 5' 3.5" (1.613 m)   Wt 268 lb 12.8 oz (121.9 kg)   SpO2 95%   BMI 46.87 kg/m   History of Present Illness Lindsey Rowe is a 71 y.o. female with a history of morbid obesity, chronic hypoxic respiratory failure who presented to the pulmonary office for follow up of ONO and SNIFF testing.    The patient reports that she and Dr. Marchelle Gearing are in the process of investigating why she is requiring oxygen.  She was to have an ONO (completed yesterday 8/29 through [facility]) and a SNIFF testing (not done yet).  Reports that she noticed her oxygen dropped down to 81% at night. She states she didn't sleep during the overnight oximetry testing.  She is pending a sleep study.   Physical Exam Patient not examined > NO CHARGE VISIT   Studies: ECHO 03/2016 >>  Normal LV size with EF 60-65%. Moderate diastolic dysfunction.  Normal RV size and systolic  function. No significant valvular abnormalities.  CTA Chest 03/24/2016 >> 1. No CT evidence for acute pulmonary embolism.  2. No other acute cardiopulmonary abnormality identified.  3. 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  CT Chest HR 09/25/16 >> no evidence of ILD, stable chronic mild elevation of the R hemidiaphragm with mild R basilar atelectasis, mild patchy air trapping in both lungs indacative of small airways disease, two vessel coronary atherosclerosis, stable 4.3 cm ascending thoracic aortic aneurysm  Assessment/Plan: Primary Pulmonary - Dr. Marchelle Gearingamaswamy  Discussion:  71 year old morbidly obese female  with hypoxic respiratory failure. Unfortunately, she was to have an overnight oximetry and SNIFF testing which have not even been scheduled.    Plan: NO CHARGE VISIT > testing not completed / she was scheduled too early  Reviewed her appointments > she does have a visit schedeuled with Dr. Craige CottaSood for sleep evaluation on 10/18.  Will order split night sleep study to get the process started.  This should be completed before she sees Dr. Craige CottaSood  Patient Instructions  1.  We have scheduled your SNIFF test  2.  We will obtain the results of your overnight oximetry 3.  Wear your oxygen at 2L continuously until we get the results of your overnight oximetry 4.  Once these are completed we will see you back in the office for review 5.  Call if you have new or worsening symptoms    Lindsey BrimBrandi Delayla Hoffmaster, NP-C [facility]  [phone] 10/04/2016, 3:21 PM

## 2016-10-04 NOTE — Addendum Note (Signed)
Addended by: Wyvonne LenzPINION, Thyra Yinger P on: 10/04/2016 04:18 PM   Modules accepted: Orders

## 2016-10-04 NOTE — Telephone Encounter (Signed)
The patient presented to the pulmonary office this am for follow up on ONO and SNIFF testing.  They were not completed.  She is also planned to see Dr. Craige CottaSood 10/18 for sleep evaluation.  Can we please get her scheduled for a split night sleep study before 10/18 appt?   Thanks,   Canary BrimBrandi Jerilyn Gillaspie, NP-C [place] [facility] Pgr: [phone] or if no answer [phone] 10/04/2016, 3:39 PM

## 2016-10-04 NOTE — Patient Instructions (Signed)
1.  We have scheduled your SNIFF test  2.  We will obtain the results of your overnight oximetry 3.  Wear your oxygen at 2L continuously until we get the results of your overnight oximetry 4.  Once these are completed we will see you back in the office for review 5.  Call if you have new or worsening symptoms

## 2016-10-05 ENCOUNTER — Telehealth: Payer: Self-pay | Admitting: Internal Medicine

## 2016-10-05 ENCOUNTER — Ambulatory Visit (HOSPITAL_COMMUNITY): Payer: PPO

## 2016-10-05 NOTE — Telephone Encounter (Signed)
Attempted to call the pt to leave a VM but the VM is full and could not leave a message.

## 2016-10-05 NOTE — Telephone Encounter (Signed)
Pt was scheduled to see Canary BrimBrandi Ollis, NP, on 10/04/2016. Unable to get ONO and Sniff Test prior to visit with 2 days notice. Pt has been scheduled for sniff test in 10/2016. Pt also scheduled to see VS for Sleep Consult in 11/2016. Will sign off.

## 2016-10-05 NOTE — Telephone Encounter (Signed)
ATC patient. Call went straight to VM and VM was full at time of call. Will need to call back.

## 2016-10-05 NOTE — Telephone Encounter (Signed)
PCC's - pt is scheduled to see VS on 11/22/2016 for sleep consult but has a sleep study on 12/02/16. Could pt get a sooner sleep study to have this done prior to visit with VS, per Brandi's request. Thanks.

## 2016-10-07 DIAGNOSIS — R0902 Hypoxemia: Secondary | ICD-10-CM | POA: Diagnosis not present

## 2016-10-07 DIAGNOSIS — R55 Syncope and collapse: Secondary | ICD-10-CM | POA: Diagnosis not present

## 2016-10-07 DIAGNOSIS — J45909 Unspecified asthma, uncomplicated: Secondary | ICD-10-CM | POA: Diagnosis not present

## 2016-10-07 DIAGNOSIS — I129 Hypertensive chronic kidney disease with stage 1 through stage 4 chronic kidney disease, or unspecified chronic kidney disease: Secondary | ICD-10-CM | POA: Diagnosis not present

## 2016-10-09 ENCOUNTER — Ambulatory Visit (HOSPITAL_COMMUNITY): Payer: PPO

## 2016-10-09 NOTE — Telephone Encounter (Signed)
atc the pt but the mail box is full and could not leave a message.  Looks like the order has been placed but the sniff test has not been scheduled yet.  They will call her once this has been scheduled to make her aware of the appt.

## 2016-10-09 NOTE — Telephone Encounter (Signed)
atc the pt but the VM is full and cannot accept messages.  Will sign off of this message at this time due to the number of attempts made to contact the pt.

## 2016-10-10 DIAGNOSIS — J45909 Unspecified asthma, uncomplicated: Secondary | ICD-10-CM | POA: Diagnosis not present

## 2016-10-10 DIAGNOSIS — R0902 Hypoxemia: Secondary | ICD-10-CM | POA: Diagnosis not present

## 2016-10-10 DIAGNOSIS — I129 Hypertensive chronic kidney disease with stage 1 through stage 4 chronic kidney disease, or unspecified chronic kidney disease: Secondary | ICD-10-CM | POA: Diagnosis not present

## 2016-10-10 DIAGNOSIS — R55 Syncope and collapse: Secondary | ICD-10-CM | POA: Diagnosis not present

## 2016-10-11 ENCOUNTER — Ambulatory Visit (HOSPITAL_COMMUNITY): Payer: PPO

## 2016-10-11 NOTE — Telephone Encounter (Signed)
lmtcb x1 for pt. 

## 2016-10-12 NOTE — Telephone Encounter (Signed)
Closing per triage protocol  

## 2016-10-15 ENCOUNTER — Telehealth: Payer: Self-pay | Admitting: Internal Medicine

## 2016-10-15 NOTE — Telephone Encounter (Signed)
ATC pt, no answer. Left message for pt to call back.  

## 2016-10-16 DIAGNOSIS — S43004D Unspecified dislocation of right shoulder joint, subsequent encounter: Secondary | ICD-10-CM | POA: Diagnosis not present

## 2016-10-16 DIAGNOSIS — M25611 Stiffness of right shoulder, not elsewhere classified: Secondary | ICD-10-CM | POA: Diagnosis not present

## 2016-10-16 NOTE — Telephone Encounter (Signed)
lmomtcb x 2 for the pt.  

## 2016-10-17 NOTE — Telephone Encounter (Signed)
Left message for patient. [facility]

## 2016-10-17 NOTE — Telephone Encounter (Signed)
lmomtcb x 3 for pt to call back

## 2016-10-17 NOTE — Telephone Encounter (Signed)
Pt returning all , said that she had blocked our # by mistake but has now fixed the problem and she can now be reached @ [phone].Lindsey GriffinsStanley A Rowe

## 2016-10-18 NOTE — Telephone Encounter (Signed)
lmomtcb x 2 to make her aware of appt for the sniff test.

## 2016-10-22 NOTE — Telephone Encounter (Signed)
lmtcb x3 for pt. 

## 2016-10-23 ENCOUNTER — Ambulatory Visit (INDEPENDENT_AMBULATORY_CARE_PROVIDER_SITE_OTHER): Payer: PPO | Admitting: *Deleted

## 2016-10-23 ENCOUNTER — Ambulatory Visit (HOSPITAL_COMMUNITY): Payer: PPO

## 2016-10-23 DIAGNOSIS — R55 Syncope and collapse: Secondary | ICD-10-CM | POA: Diagnosis not present

## 2016-10-23 NOTE — Progress Notes (Signed)
Carelink Summary Report / Loop Recorder 

## 2016-10-25 LAB — CUP PACEART REMOTE DEVICE CHECK
Date Time Interrogation Session: 20180918124123
Implantable Pulse Generator Implant Date: 20180420

## 2016-10-26 ENCOUNTER — Ambulatory Visit (HOSPITAL_COMMUNITY)
Admission: RE | Admit: 2016-10-26 | Discharge: 2016-10-26 | Disposition: A | Discharge status: Home / Self Care | Payer: PPO | Source: Ambulatory Visit | Attending: Pulmonary Disease | Admitting: Pulmonary Disease

## 2016-10-26 DIAGNOSIS — J986 Disorders of diaphragm: Secondary | ICD-10-CM | POA: Diagnosis not present

## 2016-11-05 DIAGNOSIS — M810 Age-related osteoporosis without current pathological fracture: Secondary | ICD-10-CM | POA: Diagnosis not present

## 2016-11-05 DIAGNOSIS — M8588 Other specified disorders of bone density and structure, other site: Secondary | ICD-10-CM | POA: Diagnosis not present

## 2016-11-06 ENCOUNTER — Telehealth: Payer: Self-pay | Admitting: Pulmonary Disease

## 2016-11-06 DIAGNOSIS — I129 Hypertensive chronic kidney disease with stage 1 through stage 4 chronic kidney disease, or unspecified chronic kidney disease: Secondary | ICD-10-CM | POA: Diagnosis not present

## 2016-11-06 DIAGNOSIS — R55 Syncope and collapse: Secondary | ICD-10-CM | POA: Diagnosis not present

## 2016-11-06 DIAGNOSIS — R0902 Hypoxemia: Secondary | ICD-10-CM | POA: Diagnosis not present

## 2016-11-06 DIAGNOSIS — J45909 Unspecified asthma, uncomplicated: Secondary | ICD-10-CM | POA: Diagnosis not present

## 2016-11-06 NOTE — Telephone Encounter (Signed)
SNIFF testing reviewed with results below.  Called patient and reviewed results with her.  Doubt that the findings on the SNIFF testing are responsible for hypoxia.    Plan: Await sleep study  Reviewed time of appointment with Dr. Craige Cotta on 10/18 with patient  Follow up for sleep study on 10/28 as planned Pt instructed to call for a routine follow up visit with Dr. Marchelle Gearing in December of 2018      FINDINGS: The right hemidiaphragm remains elevated. During normal respiration and rapid inspiration, both hemidiaphragms move in unison. There is reduced excursion of the right hemidiaphragm during rapid inspiration. No paradoxical motion.  IMPRESSION: 1. Elevated right hemidiaphragm with reduced excursion during rapid inspiration. No paradoxical motion.   Electronically Signed By: Obie Dredge M.D. On: 10/26/2016 14:10     Lindsey Brim, NP-C [place] [facility] Pgr: [phone] or if no answer [phone] 11/06/2016, 2:19 PM

## 2016-11-09 DIAGNOSIS — R55 Syncope and collapse: Secondary | ICD-10-CM | POA: Diagnosis not present

## 2016-11-09 DIAGNOSIS — I129 Hypertensive chronic kidney disease with stage 1 through stage 4 chronic kidney disease, or unspecified chronic kidney disease: Secondary | ICD-10-CM | POA: Diagnosis not present

## 2016-11-09 DIAGNOSIS — J45909 Unspecified asthma, uncomplicated: Secondary | ICD-10-CM | POA: Diagnosis not present

## 2016-11-09 DIAGNOSIS — R0902 Hypoxemia: Secondary | ICD-10-CM | POA: Diagnosis not present

## 2016-11-22 ENCOUNTER — Institutional Professional Consult (permissible substitution): Payer: PPO | Admitting: Pulmonary Disease

## 2016-11-22 ENCOUNTER — Ambulatory Visit (INDEPENDENT_AMBULATORY_CARE_PROVIDER_SITE_OTHER): Payer: PPO | Admitting: *Deleted

## 2016-11-22 DIAGNOSIS — R55 Syncope and collapse: Secondary | ICD-10-CM

## 2016-11-22 NOTE — Progress Notes (Signed)
Carelink Summary Report / loop Recorder  

## 2016-11-23 LAB — CUP PACEART REMOTE DEVICE CHECK
Date Time Interrogation Session: 20181018124217
Implantable Pulse Generator Implant Date: 20180420

## 2016-12-02 ENCOUNTER — Encounter (HOSPITAL_BASED_OUTPATIENT_CLINIC_OR_DEPARTMENT_OTHER): Payer: PPO

## 2016-12-06 DIAGNOSIS — M810 Age-related osteoporosis without current pathological fracture: Secondary | ICD-10-CM | POA: Diagnosis not present

## 2016-12-07 DIAGNOSIS — R0902 Hypoxemia: Secondary | ICD-10-CM | POA: Diagnosis not present

## 2016-12-07 DIAGNOSIS — J45909 Unspecified asthma, uncomplicated: Secondary | ICD-10-CM | POA: Diagnosis not present

## 2016-12-07 DIAGNOSIS — R55 Syncope and collapse: Secondary | ICD-10-CM | POA: Diagnosis not present

## 2016-12-07 DIAGNOSIS — I129 Hypertensive chronic kidney disease with stage 1 through stage 4 chronic kidney disease, or unspecified chronic kidney disease: Secondary | ICD-10-CM | POA: Diagnosis not present

## 2016-12-24 ENCOUNTER — Ambulatory Visit (INDEPENDENT_AMBULATORY_CARE_PROVIDER_SITE_OTHER): Payer: PPO | Admitting: *Deleted

## 2016-12-24 DIAGNOSIS — R55 Syncope and collapse: Secondary | ICD-10-CM | POA: Diagnosis not present

## 2016-12-25 ENCOUNTER — Telehealth: Payer: Self-pay | Admitting: Cardiology

## 2016-12-25 NOTE — Telephone Encounter (Signed)
Attempted to call pt b/c her home monitor has not updated in at least 14 days. No answer and unable to leave a message.  

## 2016-12-25 NOTE — Progress Notes (Signed)
Carelink Summary Report / Loop Recorder 

## 2017-01-01 ENCOUNTER — Institutional Professional Consult (permissible substitution): Payer: PPO | Admitting: Pulmonary Disease

## 2017-01-03 ENCOUNTER — Encounter: Payer: Self-pay | Admitting: Cardiology

## 2017-01-06 DIAGNOSIS — R55 Syncope and collapse: Secondary | ICD-10-CM | POA: Diagnosis not present

## 2017-01-06 DIAGNOSIS — R0902 Hypoxemia: Secondary | ICD-10-CM | POA: Diagnosis not present

## 2017-01-06 DIAGNOSIS — I129 Hypertensive chronic kidney disease with stage 1 through stage 4 chronic kidney disease, or unspecified chronic kidney disease: Secondary | ICD-10-CM | POA: Diagnosis not present

## 2017-01-06 DIAGNOSIS — J45909 Unspecified asthma, uncomplicated: Secondary | ICD-10-CM | POA: Diagnosis not present

## 2017-01-09 ENCOUNTER — Encounter (HOSPITAL_BASED_OUTPATIENT_CLINIC_OR_DEPARTMENT_OTHER): Payer: PPO

## 2017-01-09 DIAGNOSIS — R0902 Hypoxemia: Secondary | ICD-10-CM | POA: Diagnosis not present

## 2017-01-09 DIAGNOSIS — J45909 Unspecified asthma, uncomplicated: Secondary | ICD-10-CM | POA: Diagnosis not present

## 2017-01-09 DIAGNOSIS — R55 Syncope and collapse: Secondary | ICD-10-CM | POA: Diagnosis not present

## 2017-01-09 DIAGNOSIS — I129 Hypertensive chronic kidney disease with stage 1 through stage 4 chronic kidney disease, or unspecified chronic kidney disease: Secondary | ICD-10-CM | POA: Diagnosis not present

## 2017-01-10 LAB — CUP PACEART REMOTE DEVICE CHECK
Date Time Interrogation Session: 20181117144253
Implantable Pulse Generator Implant Date: 20180420

## 2017-01-18 ENCOUNTER — Encounter: Payer: Self-pay | Admitting: Cardiology

## 2017-01-21 ENCOUNTER — Ambulatory Visit (INDEPENDENT_AMBULATORY_CARE_PROVIDER_SITE_OTHER): Payer: PPO | Admitting: *Deleted

## 2017-01-21 DIAGNOSIS — R55 Syncope and collapse: Secondary | ICD-10-CM | POA: Diagnosis not present

## 2017-01-21 NOTE — Progress Notes (Signed)
Carelink Summary Report / Loop Recorder 

## 2017-01-23 ENCOUNTER — Institutional Professional Consult (permissible substitution): Payer: PPO | Admitting: Pulmonary Disease

## 2017-01-27 ENCOUNTER — Ambulatory Visit (HOSPITAL_BASED_OUTPATIENT_CLINIC_OR_DEPARTMENT_OTHER): Payer: PPO | Attending: Pulmonary Disease

## 2017-01-31 ENCOUNTER — Telehealth: Payer: Self-pay | Admitting: Cardiology

## 2017-01-31 LAB — CUP PACEART REMOTE DEVICE CHECK
Date Time Interrogation Session: 20181217144203
Implantable Pulse Generator Implant Date: 20180420

## 2017-01-31 NOTE — Telephone Encounter (Signed)
Attempted to call pt b/c his home monitor has not updated in at least 14 days. No answer and unable to leave a message.  

## 2017-02-05 DIAGNOSIS — M4800 Spinal stenosis, site unspecified: Secondary | ICD-10-CM | POA: Diagnosis not present

## 2017-02-06 DIAGNOSIS — M4800 Spinal stenosis, site unspecified: Secondary | ICD-10-CM | POA: Diagnosis not present

## 2017-02-06 DIAGNOSIS — I129 Hypertensive chronic kidney disease with stage 1 through stage 4 chronic kidney disease, or unspecified chronic kidney disease: Secondary | ICD-10-CM | POA: Diagnosis not present

## 2017-02-06 DIAGNOSIS — R55 Syncope and collapse: Secondary | ICD-10-CM | POA: Diagnosis not present

## 2017-02-06 DIAGNOSIS — R0902 Hypoxemia: Secondary | ICD-10-CM | POA: Diagnosis not present

## 2017-02-06 DIAGNOSIS — J45909 Unspecified asthma, uncomplicated: Secondary | ICD-10-CM | POA: Diagnosis not present

## 2017-02-07 DIAGNOSIS — M4800 Spinal stenosis, site unspecified: Secondary | ICD-10-CM | POA: Diagnosis not present

## 2017-02-08 ENCOUNTER — Encounter: Payer: Self-pay | Admitting: Cardiology

## 2017-02-08 DIAGNOSIS — M4800 Spinal stenosis, site unspecified: Secondary | ICD-10-CM | POA: Diagnosis not present

## 2017-02-09 DIAGNOSIS — J45909 Unspecified asthma, uncomplicated: Secondary | ICD-10-CM | POA: Diagnosis not present

## 2017-02-09 DIAGNOSIS — I129 Hypertensive chronic kidney disease with stage 1 through stage 4 chronic kidney disease, or unspecified chronic kidney disease: Secondary | ICD-10-CM | POA: Diagnosis not present

## 2017-02-09 DIAGNOSIS — R0902 Hypoxemia: Secondary | ICD-10-CM | POA: Diagnosis not present

## 2017-02-09 DIAGNOSIS — R55 Syncope and collapse: Secondary | ICD-10-CM | POA: Diagnosis not present

## 2017-02-09 DIAGNOSIS — M4800 Spinal stenosis, site unspecified: Secondary | ICD-10-CM | POA: Diagnosis not present

## 2017-02-10 DIAGNOSIS — M4800 Spinal stenosis, site unspecified: Secondary | ICD-10-CM | POA: Diagnosis not present

## 2017-02-11 DIAGNOSIS — M4800 Spinal stenosis, site unspecified: Secondary | ICD-10-CM | POA: Diagnosis not present

## 2017-02-12 DIAGNOSIS — M4800 Spinal stenosis, site unspecified: Secondary | ICD-10-CM | POA: Diagnosis not present

## 2017-02-13 ENCOUNTER — Other Ambulatory Visit: Payer: Self-pay | Admitting: Family Medicine

## 2017-02-13 DIAGNOSIS — Z1231 Encounter for screening mammogram for malignant neoplasm of breast: Secondary | ICD-10-CM

## 2017-02-13 DIAGNOSIS — M4800 Spinal stenosis, site unspecified: Secondary | ICD-10-CM | POA: Diagnosis not present

## 2017-02-14 DIAGNOSIS — M4800 Spinal stenosis, site unspecified: Secondary | ICD-10-CM | POA: Diagnosis not present

## 2017-02-15 DIAGNOSIS — M4800 Spinal stenosis, site unspecified: Secondary | ICD-10-CM | POA: Diagnosis not present

## 2017-02-16 DIAGNOSIS — M4800 Spinal stenosis, site unspecified: Secondary | ICD-10-CM | POA: Diagnosis not present

## 2017-02-17 DIAGNOSIS — M4800 Spinal stenosis, site unspecified: Secondary | ICD-10-CM | POA: Diagnosis not present

## 2017-02-18 DIAGNOSIS — M4800 Spinal stenosis, site unspecified: Secondary | ICD-10-CM | POA: Diagnosis not present

## 2017-02-19 DIAGNOSIS — M4800 Spinal stenosis, site unspecified: Secondary | ICD-10-CM | POA: Diagnosis not present

## 2017-02-20 ENCOUNTER — Ambulatory Visit (INDEPENDENT_AMBULATORY_CARE_PROVIDER_SITE_OTHER): Payer: Medicare HMO | Admitting: Pulmonary Disease

## 2017-02-20 ENCOUNTER — Encounter: Payer: Self-pay | Admitting: Pulmonary Disease

## 2017-02-20 ENCOUNTER — Telehealth: Payer: Self-pay | Admitting: Pulmonary Disease

## 2017-02-20 ENCOUNTER — Ambulatory Visit (INDEPENDENT_AMBULATORY_CARE_PROVIDER_SITE_OTHER): Payer: Medicare HMO | Admitting: *Deleted

## 2017-02-20 VITALS — BP 120/62 | HR 88 | Ht 63.0 in | Wt 268.8 lb

## 2017-02-20 DIAGNOSIS — R29818 Other symptoms and signs involving the nervous system: Secondary | ICD-10-CM | POA: Diagnosis not present

## 2017-02-20 DIAGNOSIS — Z6841 Body Mass Index (BMI) 40.0 and over, adult: Secondary | ICD-10-CM

## 2017-02-20 DIAGNOSIS — M4800 Spinal stenosis, site unspecified: Secondary | ICD-10-CM | POA: Diagnosis not present

## 2017-02-20 DIAGNOSIS — J410 Simple chronic bronchitis: Secondary | ICD-10-CM

## 2017-02-20 DIAGNOSIS — R55 Syncope and collapse: Secondary | ICD-10-CM

## 2017-02-20 DIAGNOSIS — E662 Morbid (severe) obesity with alveolar hypoventilation: Secondary | ICD-10-CM

## 2017-02-20 DIAGNOSIS — J9611 Chronic respiratory failure with hypoxia: Secondary | ICD-10-CM

## 2017-02-20 DIAGNOSIS — J9612 Chronic respiratory failure with hypercapnia: Secondary | ICD-10-CM

## 2017-02-20 NOTE — Patient Instructions (Signed)
Will arrange for in lab sleep study and pulmonary function test  Follow up in 3 months

## 2017-02-20 NOTE — Progress Notes (Signed)
   Subjective:    Patient ID: Karna Dupeseborah C Ganson, female    DOB: 09-Apr-1945, 72 y.o.   MRN: [id number]  HPI    Review of Systems  Constitutional: Positive for unexpected weight change. Negative for fever.  HENT: Negative for congestion, dental problem, ear pain, nosebleeds, postnasal drip, rhinorrhea, sinus pressure, sneezing, sore throat and trouble swallowing.   Eyes: Negative for redness and itching.  Respiratory: Negative for cough, chest tightness, shortness of breath and wheezing.   Cardiovascular: Positive for leg swelling. Negative for palpitations.  Gastrointestinal: Negative for nausea and vomiting.  Genitourinary: Negative for dysuria.  Musculoskeletal: Negative for joint swelling.  Skin: Negative for rash.  Allergic/Immunologic: Negative.  Negative for environmental allergies, food allergies and immunocompromised state.  Neurological: Positive for headaches.  Hematological: Does not bruise/bleed easily.  Psychiatric/Behavioral: Negative for dysphoric mood. The patient is not nervous/anxious.        Objective:   Physical Exam        Assessment & Plan:

## 2017-02-20 NOTE — Addendum Note (Signed)
Addended by: Cornell BarmanWHITTAKER, Christasia Angeletti M on: 02/20/2017 11:43 AM   Modules accepted: Orders

## 2017-02-20 NOTE — Progress Notes (Signed)
[place] [facility]  Chief Complaint  Patient presents with  . Sleep Consult    Pt referred by MR. Pt is on O2 at 2 liters con't, uses O2 to sleep each night. Pt cannot fall alseep well may take up to 2 hours or more, and cannot stay asleep during the night. Wakes up exhausted each morning. Pt states no naps during the day, unless if she is extrelemy tired.    Vital signs: BP 120/62 (BP Location: Left Arm, Cuff Size: Normal)   Pulse 88   Ht 5\' 3"  (1.6 m)   Wt 268 lb 12.8 oz (121.9 kg)   SpO2 91%   BMI 47.62 kg/m   History of Present Illness: Lindsey Rowe is a 72 y.o. female for evaluation of sleep problems.  She has been using oxygen at 2 liters.  She was found to have elevated PCO2.  She has trouble with her sleep, and there was concern she could have sleep apnea.  She snores, and can't sleep on her back.  She is tired all day long.  She doesn't dream much.  She goes to sleep between 10 pm and 1 am.  She falls asleep after 30 minutes.  She wakes up 2 or 3 times to use the bathroom.  She gets out of bed between 8 and 10 am.  She feels tired in the morning.  She denies morning headache.  She does not use anything to help her stay awake.  She has been using trazodone for the past 3+ years.  She denies sleep walking, sleep talking, bruxism, or nightmares.  There is no history of restless legs.  She denies sleep hallucinations, sleep paralysis, or cataplexy.  The Epworth score is 3 out of 24.  Her father had emphysema.  She has cough with clear sputum.  She is worried that she could have COPD also.  She never smoked, but had second hand exposure.  She does not have wheeze, chest pain, or hemoptysis.   Physical Exam:  General - pleasant, wearing oxygen Eyes - pupils reactive ENT - no sinus tenderness, no oral exudate, no LAN, MP 4 Cardiac - regular, no murmur Chest - no wheeze, rales Abd - soft, non tender Ext - no edema Skin - no rashes Neuro -  normal strength Psych - normal mood   CMP Latest Ref Rng & Units 05/25/2016 05/24/2016 04/10/2016  Glucose 65 - 99 mg/dL 99 409(W123(H) 119(J103(H)  BUN 6 - 20 mg/dL 20 47(W21(H) 13  Creatinine 0.44 - 1.00 mg/dL 2.950.90 6.21(H1.05(H) 0.860.99  Sodium 135 - 145 mmol/L 140 137 139  Potassium 3.5 - 5.1 mmol/L 4.3 4.3 3.1(L)  Chloride 101 - 111 mmol/L 99(L) 97(L) 91(L)  CO2 22 - 32 mmol/L 35(H) 30 38(H)  Calcium 8.9 - 10.3 mg/dL 5.7(Q8.4(L) 4.6(N8.8(L) 9.3  Total Protein 6.5 - 8.1 g/dL - 5.8(L) -  Total Bilirubin 0.3 - 1.2 mg/dL - 0.5 -  Alkaline Phos 38 - 126 U/L - 82 -  AST 15 - 41 U/L - 21 -  ALT 14 - 54 U/L - 12(L) -    CBC Latest Ref Rng & Units 05/25/2016 05/24/2016 04/10/2016  WBC 4.0 - 10.5 K/uL 8.1 10.4 10.1  Hemoglobin 12.0 - 15.0 g/dL 10.7(L) 11.9(L) 12.5  Hematocrit 36.0 - 46.0 % 35.3(L) 38.2 40.3  Platelets 150 - 400 K/uL 172 223 202    ABG    Component Value Date/Time   PHART 7.312 (L) 07/27/2012 [id number]  PCO2ART 67.3 (HH) 07/27/2012 0621   PO2ART 99.3 07/27/2012 0621   HCO3 33.0 (H) 07/27/2012 0621   TCO2 30 08/22/2012 1633   O2SAT 80.2 03/23/2016 1840     Discussion: She has snoring, sleep disruption, and daytime sleepiness.  She has history of hypertension, depression, fibromyalgia.  She also has history of morbid obesity with hypoventilation, and elevated right diaphragm.  She has family history of emphysema, and has persistent cough.  She might also have sleep apnea.  We discussed how sleep apnea can affect various health problems, including risks for hypertension, cardiovascular disease, and diabetes.  We also discussed how sleep disruption can increase risks for accidents, such as while driving.  Weight loss as a means of improving sleep apnea was also reviewed.  Additional treatment options discussed were CPAP therapy, oral appliance, and surgical intervention.  Assessment/Plan:  Suspected sleep apnea. - will arrange for in lab sleep study  Morbid obesity. - discussed importance of weight  loss  Chronic hypoxic, hypercapnic respiratory failure. - continue 2 liters oxygen 24/7 - will determine if she might also benefit from PAP therapy  Chronic bronchitis with family history of COPD. - recent CT chest showed small airway disease - will arrange for PFT to further assess   Patient Instructions  Will arrange for in lab sleep study and pulmonary function test  Follow up in 3 months    Coralyn Helling, MD [facility] Pulmonary/Critical Care 02/20/2017, 10:42 AM Pager:  [phone]  Flow Sheet  Pulmonary tests: HRCT chest 09/25/16 >> elevated Rt diaphragm, patchy air trapping, atherosclerosis SNIFF test 10/26/16 >> elevated rt diaphragm, no paradoxical motion  Sleep tests:  Cardiac tests: Echo 03/24/16 >> EF 60 to 65%, grade 2 DD, PAS 31 mmHg  Review of Systems: Constitutional: Positive for unexpected weight change. Negative for fever.  HENT: Negative for congestion, dental problem, ear pain, nosebleeds, postnasal drip, rhinorrhea, sinus pressure, sneezing, sore throat and trouble swallowing.   Eyes: Negative for redness and itching.  Respiratory: Negative for cough, chest tightness, shortness of breath and wheezing.   Cardiovascular: Positive for leg swelling. Negative for palpitations.  Gastrointestinal: Negative for nausea and vomiting.  Genitourinary: Negative for dysuria.  Musculoskeletal: Negative for joint swelling.  Skin: Negative for rash.  Allergic/Immunologic: Negative.  Negative for environmental allergies, food allergies and immunocompromised state.  Neurological: Positive for headaches.  Hematological: Does not bruise/bleed easily.  Psychiatric/Behavioral: Negative for dysphoric mood. The patient is not nervous/anxious.    Past Medical History: She  has a past medical history of Arthritis, Back pain, Bipolar 1 disorder (HCC), Complication of anesthesia, Depression, GERD (gastroesophageal reflux disease), Headache(784.0), Hyperlipidemia, Hypertension,  Hypothyroidism, and Thyroid disease.  Past Surgical History: She  has a past surgical history that includes Appendectomy; Gastric bypass; Back surgery; Tonsillectomy; Wrist surgery; Eye surgery (Bilateral); Nasal sinus surgery (Bilateral); Lumbar laminectomy/decompression microdiscectomy (Right, 07/23/2012); and LOOP RECORDER INSERTION (N/A, 05/25/2016).  Family History: Her family history includes Diabetes in her sister; Emphysema in her father; Heart disease in her mother; Other in her brother.  Social History: She  reports that  has never smoked. she has never used smokeless tobacco. She reports that she drinks alcohol. She reports that she does not use drugs.  Medications: Allergies as of 02/20/2017      Reactions   Gabapentin Other (See Comments)   Right foot and leg swelled    Cefadroxil Itching   Ends of hair itched       Medication List  Accurate as of 02/20/17 10:42 AM. Always use your most recent med list.          BC HEADACHE POWDER PO Take 1 packet by mouth as needed (headache, pain).   D3-1000 1000 units capsule Generic drug:  Cholecalciferol Take 1,000 Units by mouth daily.   DULoxetine 60 MG capsule Commonly known as:  CYMBALTA Take 60 mg by mouth at bedtime.   DULoxetine 30 MG capsule Commonly known as:  CYMBALTA Take 1 capsule (30 mg total) by mouth 2 (two) times daily.   levothyroxine 75 MCG tablet Commonly known as:  SYNTHROID, LEVOTHROID Take 75 mcg by mouth daily before breakfast.   lisinopril 10 MG tablet Commonly known as:  PRINIVIL,ZESTRIL   meloxicam 15 MG tablet Commonly known as:  MOBIC Take 15 mg by mouth daily.   metoprolol succinate 25 MG 24 hr tablet Commonly known as:  TOPROL-XL   omeprazole 20 MG capsule Commonly known as:  PRILOSEC Take 20 mg by mouth 2 (two) times daily before a meal.   pravastatin 20 MG tablet Commonly known as:  PRAVACHOL Take 20 mg by mouth at bedtime.   pregabalin 100 MG capsule Commonly known  as:  LYRICA Take 1 capsule (100 mg total) by mouth 2 (two) times daily.   SUMAtriptan 100 MG tablet Commonly known as:  IMITREX Take 100 mg by mouth as needed for headache. Take 100mg  at onset of headache, may repeat in 2 hours if needed. Do not exceed 2 doses in 24 hours.   traZODone 100 MG tablet Commonly known as:  DESYREL Take 300 mg by mouth at bedtime.   vitamin B-12 1000 MCG tablet Commonly known as:  CYANOCOBALAMIN Take 1,000 mcg by mouth daily.

## 2017-02-20 NOTE — Telephone Encounter (Signed)
Spoke with [employer]. He stated that he needed the patient's O2 to be placed on our O2 template. Advised that I would resend the order. Nothing else needed at time of call.

## 2017-02-21 ENCOUNTER — Telehealth: Payer: Self-pay | Admitting: Pulmonary Disease

## 2017-02-21 DIAGNOSIS — M4647 Discitis, unspecified, lumbosacral region: Secondary | ICD-10-CM | POA: Diagnosis not present

## 2017-02-21 DIAGNOSIS — E662 Morbid (severe) obesity with alveolar hypoventilation: Secondary | ICD-10-CM | POA: Diagnosis not present

## 2017-02-21 DIAGNOSIS — J9611 Chronic respiratory failure with hypoxia: Secondary | ICD-10-CM | POA: Diagnosis not present

## 2017-02-21 DIAGNOSIS — J410 Simple chronic bronchitis: Secondary | ICD-10-CM | POA: Diagnosis not present

## 2017-02-21 DIAGNOSIS — M4800 Spinal stenosis, site unspecified: Secondary | ICD-10-CM | POA: Diagnosis not present

## 2017-02-21 NOTE — Telephone Encounter (Signed)
Attempted to call pt but no answer and vm box full. Will try to call pt back later.

## 2017-02-21 NOTE — Progress Notes (Signed)
Carelink Summary Report / Loop Recorder 

## 2017-02-22 DIAGNOSIS — M4800 Spinal stenosis, site unspecified: Secondary | ICD-10-CM | POA: Diagnosis not present

## 2017-02-22 NOTE — Telephone Encounter (Signed)
atc pt X2, no answer and vm full.  Will call back.

## 2017-02-23 DIAGNOSIS — M4800 Spinal stenosis, site unspecified: Secondary | ICD-10-CM | POA: Diagnosis not present

## 2017-02-24 DIAGNOSIS — M4800 Spinal stenosis, site unspecified: Secondary | ICD-10-CM | POA: Diagnosis not present

## 2017-02-25 DIAGNOSIS — M4800 Spinal stenosis, site unspecified: Secondary | ICD-10-CM | POA: Diagnosis not present

## 2017-02-25 NOTE — Telephone Encounter (Signed)
Chronic hypoxic, hypercapnic respiratory failure. - continue 2 liters oxygen 24/7 - will determine if she might also benefit from PAP therapy  Left message for patient to call back. Per VS' office note, she needs to be on O2 24/7. [facility]

## 2017-02-26 DIAGNOSIS — M4800 Spinal stenosis, site unspecified: Secondary | ICD-10-CM | POA: Diagnosis not present

## 2017-02-26 NOTE — Telephone Encounter (Signed)
atc pt, no answer and vm full.  wcb

## 2017-02-27 DIAGNOSIS — M4800 Spinal stenosis, site unspecified: Secondary | ICD-10-CM | POA: Diagnosis not present

## 2017-02-28 DIAGNOSIS — M4800 Spinal stenosis, site unspecified: Secondary | ICD-10-CM | POA: Diagnosis not present

## 2017-02-28 NOTE — Telephone Encounter (Signed)
Left voice mail on machine for patient to return phone call back regarding results. X3 LVM for emergency contact [name] on home and work phone as well.

## 2017-03-01 DIAGNOSIS — M4800 Spinal stenosis, site unspecified: Secondary | ICD-10-CM | POA: Diagnosis not present

## 2017-03-01 LAB — CUP PACEART REMOTE DEVICE CHECK
Date Time Interrogation Session: 20190116151057
Implantable Pulse Generator Implant Date: 20180420

## 2017-03-01 NOTE — Telephone Encounter (Signed)
ATC pt, no answer. Left message for pt to call back.  

## 2017-03-02 DIAGNOSIS — M4800 Spinal stenosis, site unspecified: Secondary | ICD-10-CM | POA: Diagnosis not present

## 2017-03-03 ENCOUNTER — Encounter (HOSPITAL_COMMUNITY): Payer: Self-pay

## 2017-03-03 ENCOUNTER — Emergency Department (HOSPITAL_COMMUNITY)
Admission: EM | Admit: 2017-03-03 | Discharge: 2017-03-03 | Disposition: A | Discharge status: Home / Self Care | Payer: Medicare HMO | Attending: Emergency Medicine | Admitting: Emergency Medicine

## 2017-03-03 ENCOUNTER — Other Ambulatory Visit: Payer: Self-pay

## 2017-03-03 ENCOUNTER — Emergency Department (HOSPITAL_COMMUNITY): Payer: Medicare HMO

## 2017-03-03 ENCOUNTER — Ambulatory Visit (HOSPITAL_BASED_OUTPATIENT_CLINIC_OR_DEPARTMENT_OTHER): Payer: Medicare HMO | Attending: Pulmonary Disease

## 2017-03-03 DIAGNOSIS — E039 Hypothyroidism, unspecified: Secondary | ICD-10-CM | POA: Insufficient documentation

## 2017-03-03 DIAGNOSIS — I10 Essential (primary) hypertension: Secondary | ICD-10-CM | POA: Insufficient documentation

## 2017-03-03 DIAGNOSIS — R0602 Shortness of breath: Secondary | ICD-10-CM | POA: Diagnosis not present

## 2017-03-03 DIAGNOSIS — M4800 Spinal stenosis, site unspecified: Secondary | ICD-10-CM | POA: Diagnosis not present

## 2017-03-03 DIAGNOSIS — Z79899 Other long term (current) drug therapy: Secondary | ICD-10-CM | POA: Insufficient documentation

## 2017-03-03 DIAGNOSIS — F419 Anxiety disorder, unspecified: Secondary | ICD-10-CM | POA: Diagnosis not present

## 2017-03-03 LAB — CBC
HCT: 38 % (ref 36.0–46.0)
Hemoglobin: 12.1 g/dL (ref 12.0–15.0)
MCH: 30.1 pg (ref 26.0–34.0)
MCHC: 31.8 g/dL (ref 30.0–36.0)
MCV: 94.5 fL (ref 78.0–100.0)
Platelets: 199 10*3/uL (ref 150–400)
RBC: 4.02 MIL/uL (ref 3.87–5.11)
RDW: 14.4 % (ref 11.5–15.5)
WBC: 10.8 10*3/uL — ABNORMAL HIGH (ref 4.0–10.5)

## 2017-03-03 LAB — BASIC METABOLIC PANEL
Anion gap: 10 (ref 5–15)
BUN: 18 mg/dL (ref 6–20)
CO2: 33 mmol/L — ABNORMAL HIGH (ref 22–32)
Calcium: 9.3 mg/dL (ref 8.9–10.3)
Chloride: 98 mmol/L — ABNORMAL LOW (ref 101–111)
Creatinine, Ser: 0.83 mg/dL (ref 0.44–1.00)
GFR calc Af Amer: >60 mL/min (ref >60)
GFR calc non Af Amer: >60 mL/min (ref >60)
Glucose, Bld: 106 mg/dL — ABNORMAL HIGH (ref 65–99)
Potassium: 4.4 mmol/L (ref 3.5–5.1)
Sodium: 141 mmol/L (ref 135–145)

## 2017-03-03 LAB — PROTIME-INR
INR: 0.95
Prothrombin Time: 12.6 seconds (ref 11.4–15.2)

## 2017-03-03 LAB — I-STAT TROPONIN, ED: Troponin i, poc: 0 ng/mL (ref 0.00–0.08)

## 2017-03-03 MED ORDER — ALBUTEROL SULFATE HFA 108 (90 BASE) MCG/ACT IN AERS
2.0000 | INHALATION_SPRAY | RESPIRATORY_TRACT | Status: DC | PRN
  Administered 2017-03-03: 2 via RESPIRATORY_TRACT
  Filled 2017-03-03: qty 6.7

## 2017-03-03 NOTE — ED Notes (Signed)
Bed: WA01 Expected date: 03/03/17 Expected time: 1:05 PM Means of arrival: Ambulance Comments: [facility]

## 2017-03-03 NOTE — ED Triage Notes (Signed)
Pt comes from home, pt brought in via [facility]. Pt as of yesterday stated SOB at home. Pt called pastor and friends and they were able to help pt calm down. Pt complained of SOB again and started to hyperventilate. EMS coached pt to help slow breathing. Pt admitted feeling better after. Pt is requesting hospital admit to feel safe. Pt is currently in room laughing and making jokes with Nurse Tech. Pt is on 2L Nasal [place] at home 24/7 and currently in room. Pt AOx4, pt walks with walker.

## 2017-03-03 NOTE — Discharge Instructions (Signed)
Contact your pulmonologist Monday to be followed up this week

## 2017-03-04 DIAGNOSIS — M4800 Spinal stenosis, site unspecified: Secondary | ICD-10-CM | POA: Diagnosis not present

## 2017-03-04 NOTE — ED Provider Notes (Signed)
[facility] DEPT Provider Note   CSN: [id number] Arrival date & time: 03/03/17  1303     History   Chief Complaint Chief Complaint  Patient presents with  . Shortness of Breath    HPI Lindsey Rowe is a 72 y.o. female.  Patient complains of some shortness of breath.  She has been evaluated by a pulmonologist and a diagnosis for her shortness of breath has not been determined.   The history is provided by the patient.  Shortness of Breath  This is a recurrent problem. The problem occurs intermittently.The current episode started 2 days ago. The problem has not changed since onset.Pertinent negatives include no fever, no headaches, no cough, no chest pain, no abdominal pain and no rash.    Past Medical History:  Diagnosis Date  . Arthritis   . Back pain   . Bipolar 1 disorder ([place])   . Complication of anesthesia    hard to wake up anesthesia  . Depression   . GERD (gastroesophageal reflux disease)   . Headache(784.0)    migraines and tension headaches  . Hyperlipidemia   . Hypertension   . Hypothyroidism   . Thyroid disease     Patient Active Problem List   Diagnosis Date Noted  . Dyspnea and respiratory abnormalities 08/29/2016  . Hypoxemia 08/29/2016  . Shoulder dislocation, right, initial encounter 05/24/2016  . 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 Hyperglycemia 05/24/2016  . Anterior dislocation of right shoulder   . Syncope 03/24/2016  . Generalized weakness 09/24/2015  . Leg pain, bilateral 09/24/2015  . Depression 09/24/2015  . Loss of appetite 09/24/2015  . Leg weakness, bilateral   . Hypothyroid 12/28/2012  . Hypovolemia 12/28/2012  . Hypotension 08/13/2012  . Dehydration 08/13/2012  . AKI (acute kidney injury) ([place]) 08/13/2012  . Hypoventilation associated with obesity syndrome ([place]) 08/13/2012  . Acute respiratory failure ([place]) 07/26/2012  . Chest pain 07/26/2012  . ANKLE SPRAIN, RIGHT 03/16/2010  .  DIVERTICULOSIS, COLON 10/31/2009  . SKIN LESION 09/23/2009  . Shoulder pain, right 07/05/2009  . COUGH 03/29/2009  . Migraine 03/11/2009  . OTITIS MEDIA, RIGHT 12/13/2008  . BRUISE 12/06/2008  . ACID REFLUX DISEASE 09/13/2008  . ALLERGIC RHINITIS 05/20/2008  . UNSPECIFIED DISEASE OF HAIR AND HAIR FOLLICLES 39/76/7341  . DERMATITIS 11/12/2007  . OBESITY 07/18/2007  . HYPERCHOLESTEROLEMIA 05/13/2007  . BIPOLAR AFFECTIVE DISORDER 05/12/2007  . HYPERTENSION, BENIGN ESSENTIAL 05/12/2007  . Low back pain potentially associated with radiculopathy 05/12/2007    Past Surgical History:  Procedure Laterality Date  . APPENDECTOMY    . BACK SURGERY    . EYE SURGERY Bilateral    lens implant  . GASTRIC BYPASS    . LOOP RECORDER INSERTION N/A 05/25/2016   Procedure: Loop Recorder Insertion;  Surgeon: Will Meredith Leeds, MD;  Location: [facility];  Service: Cardiovascular;  Laterality: N/A;  . LUMBAR LAMINECTOMY/DECOMPRESSION MICRODISCECTOMY Right 07/23/2012   Procedure: LUMBAR LAMINECTOMY/DECOMPRESSION MICRODISCECTOMY 2 LEVELS;  Surgeon: Floyce Stakes, MD;  Location: [facility];  Service: Neurosurgery;  Laterality: Right;  Right Lumbar three-four  lumbar four-five Laminectomy/Foraminotomy  . NASAL SINUS SURGERY Bilateral   . TONSILLECTOMY    . WRIST SURGERY      OB History    No data available       Home Medications    Prior to Admission medications   Medication Sig Start Date End Date Taking? Authorizing Provider  Aspirin-Salicylamide-Caffeine (BC HEADACHE POWDER PO) Take 1  packet by mouth as needed (headache, pain).    [provider]  Cholecalciferol (D3-1000) 1000 units capsule Take 1,000 Units by mouth daily.    [provider]  DULoxetine (CYMBALTA) 30 MG capsule Take 1 capsule (30 mg total) by mouth 2 (two) times daily. 05/26/16   Jani Gravel, MD  DULoxetine (CYMBALTA) 60 MG capsule Take 60 mg by mouth at bedtime.    [provider]    levothyroxine (SYNTHROID, LEVOTHROID) 75 MCG tablet Take 75 mcg by mouth daily before breakfast.  09/06/15   [provider]  lisinopril (PRINIVIL,ZESTRIL) 10 MG tablet  08/15/16   [provider]  meloxicam (MOBIC) 15 MG tablet Take 15 mg by mouth daily.  08/23/15   [provider]  metoprolol succinate (TOPROL-XL) 25 MG 24 hr tablet  08/15/16   [provider]  omeprazole (PRILOSEC) 20 MG capsule Take 20 mg by mouth 2 (two) times daily before a meal.    [provider]  pravastatin (PRAVACHOL) 20 MG tablet Take 20 mg by mouth at bedtime.  09/06/15   [provider]  pregabalin (LYRICA) 100 MG capsule Take 1 capsule (100 mg total) by mouth 2 (two) times daily. 09/27/15   Geradine Girt, DO  SUMAtriptan (IMITREX) 100 MG tablet Take 100 mg by mouth as needed for headache. Take 194m at onset of headache, may repeat in 2 hours if needed. Do not exceed 2 doses in 24 hours. 04/03/16   [provider]  traZODone (DESYREL) 100 MG tablet Take 300 mg by mouth at bedtime.     [provider]  vitamin B-12 (CYANOCOBALAMIN) 1000 MCG tablet Take 1,000 mcg by mouth daily.    [provider]    Family History Family History  Problem Relation Age of Onset  . Heart disease Mother   . Emphysema Father   . Diabetes Sister   . Other Brother        Brain tumor - s/p excision    Social History Social History   Tobacco Use  . Smoking status: Never Smoker  . Smokeless tobacco: Never Used  Substance Use Topics  . Alcohol use: Yes    Comment: social  . Drug use: No     Allergies   Gabapentin and Cefadroxil   Review of Systems Review of Systems  Constitutional: Negative for appetite change, fatigue and fever.  HENT: Negative for congestion, ear discharge and sinus pressure.   Eyes: Negative for discharge.  Respiratory: Positive for shortness of breath. Negative for cough.   Cardiovascular: Negative for chest pain.   Gastrointestinal: Negative for abdominal pain and diarrhea.  Genitourinary: Negative for frequency and hematuria.  Musculoskeletal: Negative for back pain.  Skin: Negative for rash.  Neurological: Negative for seizures and headaches.  Psychiatric/Behavioral: Negative for hallucinations.     Physical Exam Updated Vital Signs BP 140/69   Pulse 96   Temp (!) 97.4 F (36.3 C) (Oral)   Resp 16   Ht _0  (1.6 m)   Wt 120.2 kg (265 lb)   SpO2 98%   BMI 46.94 kg/m   Physical Exam  Constitutional: She is oriented to person, place, and time. She appears well-developed.  HENT:  Head: Normocephalic.  Eyes: Conjunctivae and EOM are normal. No scleral icterus.  Neck: Neck supple. No thyromegaly present.  Cardiovascular: Normal rate and regular rhythm. Exam reveals no gallop and no friction rub.  No murmur heard. Pulmonary/Chest: No stridor. She has no wheezes. She has  no rales. She exhibits no tenderness.  Abdominal: She exhibits no distension. There is no tenderness. There is no rebound.  Musculoskeletal: Normal range of motion. She exhibits no edema.  No significant swelling in calves nontender.  Lymphadenopathy:    She has no cervical adenopathy.  Neurological: She is oriented to person, place, and time. She exhibits normal muscle tone. Coordination normal.  Skin: No rash noted. No erythema.  Psychiatric: She has a normal mood and affect. Her behavior is normal.     ED Treatments / Results  Labs (all labs ordered are listed, but only abnormal results are displayed) Labs Reviewed  BASIC METABOLIC PANEL - Abnormal; Notable for the following components:      Result Value   Chloride 98 (*)    CO2 33 (*)    Glucose, Bld 106 (*)    All other components within normal limits  CBC - Abnormal; Notable for the following components:   WBC 10.8 (*)    All other components within normal limits  PROTIME-INR  I-STAT TROPONIN, ED    EKG  EKG Interpretation None        Radiology Dg Chest 2 View  Result Date: 03/03/2017 CLINICAL DATA:  Onset of shortness of breath yesterday at home, hyperventilation, history hypertension, GERD EXAM: CHEST  2 VIEW COMPARISON:  10/04/2016 FINDINGS: Loop recorder projects over lower LEFT chest. Borderline enlargement of cardiac silhouette. Slight elevation of RIGHT diaphragm. Mediastinal contours and pulmonary vascularity normal. Scarring at RIGHT middle lobe. Lungs otherwise clear. No pleural effusion or pneumothorax. Bones unremarkable. IMPRESSION: Minimal RIGHT middle lobe scarring. No acute abnormalities. Electronically Signed   By: Lavonia Dana M.D.   On: 03/03/2017 14:04    Procedures Procedures (including critical care time)  Medications Ordered in ED Medications - No data to display   Initial Impression / Assessment and Plan / ED Course  I have reviewed the triage vital signs and the nursing notes.  Pertinent labs & imaging results that were available during my care of the patient were reviewed by me and considered in my medical decision making (see chart for details).     Patient complains of some shortness of breath.  Labs including a CBC been met troponin are unremarkable.  Chest x-ray shows mild scarring.  Patient given albuterol inhaler which has helped some.  She will follow back up with her pulmonologist  Final Clinical Impressions(s) / ED Diagnoses   Final diagnoses:  SOB (shortness of breath)    ED Discharge Orders    None       Milton Ferguson, MD 03/04/17 [phone]

## 2017-03-04 NOTE — Telephone Encounter (Signed)
LMTCB for pt x 3. Per triage protocol will close message as we have attempted to reach pt several times without success.

## 2017-03-05 DIAGNOSIS — M4800 Spinal stenosis, site unspecified: Secondary | ICD-10-CM | POA: Diagnosis not present

## 2017-03-06 ENCOUNTER — Ambulatory Visit: Payer: Medicare HMO | Admitting: Adult Health

## 2017-03-06 DIAGNOSIS — M4800 Spinal stenosis, site unspecified: Secondary | ICD-10-CM | POA: Diagnosis not present

## 2017-03-07 ENCOUNTER — Inpatient Hospital Stay: Admission: RE | Admit: 2017-03-07 | Payer: PPO | Source: Ambulatory Visit

## 2017-03-07 DIAGNOSIS — M4800 Spinal stenosis, site unspecified: Secondary | ICD-10-CM | POA: Diagnosis not present

## 2017-03-08 DIAGNOSIS — M4800 Spinal stenosis, site unspecified: Secondary | ICD-10-CM | POA: Diagnosis not present

## 2017-03-09 DIAGNOSIS — I129 Hypertensive chronic kidney disease with stage 1 through stage 4 chronic kidney disease, or unspecified chronic kidney disease: Secondary | ICD-10-CM | POA: Diagnosis not present

## 2017-03-09 DIAGNOSIS — M4800 Spinal stenosis, site unspecified: Secondary | ICD-10-CM | POA: Diagnosis not present

## 2017-03-09 DIAGNOSIS — R55 Syncope and collapse: Secondary | ICD-10-CM | POA: Diagnosis not present

## 2017-03-09 DIAGNOSIS — J45909 Unspecified asthma, uncomplicated: Secondary | ICD-10-CM | POA: Diagnosis not present

## 2017-03-09 DIAGNOSIS — R0902 Hypoxemia: Secondary | ICD-10-CM | POA: Diagnosis not present

## 2017-03-10 DIAGNOSIS — M4800 Spinal stenosis, site unspecified: Secondary | ICD-10-CM | POA: Diagnosis not present

## 2017-03-11 DIAGNOSIS — M4800 Spinal stenosis, site unspecified: Secondary | ICD-10-CM | POA: Diagnosis not present

## 2017-03-12 DIAGNOSIS — R55 Syncope and collapse: Secondary | ICD-10-CM | POA: Diagnosis not present

## 2017-03-12 DIAGNOSIS — R0902 Hypoxemia: Secondary | ICD-10-CM | POA: Diagnosis not present

## 2017-03-12 DIAGNOSIS — M4800 Spinal stenosis, site unspecified: Secondary | ICD-10-CM | POA: Diagnosis not present

## 2017-03-12 DIAGNOSIS — I129 Hypertensive chronic kidney disease with stage 1 through stage 4 chronic kidney disease, or unspecified chronic kidney disease: Secondary | ICD-10-CM | POA: Diagnosis not present

## 2017-03-12 DIAGNOSIS — J45909 Unspecified asthma, uncomplicated: Secondary | ICD-10-CM | POA: Diagnosis not present

## 2017-03-13 ENCOUNTER — Other Ambulatory Visit: Payer: Self-pay | Admitting: Cardiology

## 2017-03-13 DIAGNOSIS — M4800 Spinal stenosis, site unspecified: Secondary | ICD-10-CM | POA: Diagnosis not present

## 2017-03-14 DIAGNOSIS — M4800 Spinal stenosis, site unspecified: Secondary | ICD-10-CM | POA: Diagnosis not present

## 2017-03-15 ENCOUNTER — Ambulatory Visit: Payer: Medicare HMO | Admitting: Internal Medicine

## 2017-03-15 DIAGNOSIS — M4800 Spinal stenosis, site unspecified: Secondary | ICD-10-CM | POA: Diagnosis not present

## 2017-03-16 DIAGNOSIS — M4800 Spinal stenosis, site unspecified: Secondary | ICD-10-CM | POA: Diagnosis not present

## 2017-03-17 DIAGNOSIS — M4800 Spinal stenosis, site unspecified: Secondary | ICD-10-CM | POA: Diagnosis not present

## 2017-03-18 ENCOUNTER — Other Ambulatory Visit: Payer: Self-pay | Admitting: *Deleted

## 2017-03-18 DIAGNOSIS — M4800 Spinal stenosis, site unspecified: Secondary | ICD-10-CM | POA: Diagnosis not present

## 2017-03-18 DIAGNOSIS — I712 Thoracic aortic aneurysm, without rupture, unspecified: Secondary | ICD-10-CM

## 2017-03-19 DIAGNOSIS — M4800 Spinal stenosis, site unspecified: Secondary | ICD-10-CM | POA: Diagnosis not present

## 2017-03-20 DIAGNOSIS — M4800 Spinal stenosis, site unspecified: Secondary | ICD-10-CM | POA: Diagnosis not present

## 2017-03-21 DIAGNOSIS — M4800 Spinal stenosis, site unspecified: Secondary | ICD-10-CM | POA: Diagnosis not present

## 2017-03-22 ENCOUNTER — Ambulatory Visit (INDEPENDENT_AMBULATORY_CARE_PROVIDER_SITE_OTHER): Payer: Medicare HMO | Admitting: *Deleted

## 2017-03-22 DIAGNOSIS — R55 Syncope and collapse: Secondary | ICD-10-CM

## 2017-03-22 DIAGNOSIS — M4800 Spinal stenosis, site unspecified: Secondary | ICD-10-CM | POA: Diagnosis not present

## 2017-03-23 DIAGNOSIS — M4800 Spinal stenosis, site unspecified: Secondary | ICD-10-CM | POA: Diagnosis not present

## 2017-03-24 DIAGNOSIS — M4647 Discitis, unspecified, lumbosacral region: Secondary | ICD-10-CM | POA: Diagnosis not present

## 2017-03-24 DIAGNOSIS — M4800 Spinal stenosis, site unspecified: Secondary | ICD-10-CM | POA: Diagnosis not present

## 2017-03-24 DIAGNOSIS — E662 Morbid (severe) obesity with alveolar hypoventilation: Secondary | ICD-10-CM | POA: Diagnosis not present

## 2017-03-24 DIAGNOSIS — J410 Simple chronic bronchitis: Secondary | ICD-10-CM | POA: Diagnosis not present

## 2017-03-24 DIAGNOSIS — J9611 Chronic respiratory failure with hypoxia: Secondary | ICD-10-CM | POA: Diagnosis not present

## 2017-03-25 DIAGNOSIS — E662 Morbid (severe) obesity with alveolar hypoventilation: Secondary | ICD-10-CM | POA: Diagnosis not present

## 2017-03-25 DIAGNOSIS — M4800 Spinal stenosis, site unspecified: Secondary | ICD-10-CM | POA: Diagnosis not present

## 2017-03-25 NOTE — Progress Notes (Signed)
Carelink Summary Report / Loop Recorder 

## 2017-03-26 DIAGNOSIS — M4800 Spinal stenosis, site unspecified: Secondary | ICD-10-CM | POA: Diagnosis not present

## 2017-03-27 ENCOUNTER — Telehealth: Payer: Self-pay | Admitting: Cardiology

## 2017-03-27 DIAGNOSIS — M4800 Spinal stenosis, site unspecified: Secondary | ICD-10-CM | POA: Diagnosis not present

## 2017-03-27 NOTE — Telephone Encounter (Signed)
LMOVM requesting that pt send manual transmission b/c home monitor has not updated in at least 14 days.    

## 2017-03-28 DIAGNOSIS — M4800 Spinal stenosis, site unspecified: Secondary | ICD-10-CM | POA: Diagnosis not present

## 2017-03-29 DIAGNOSIS — M4800 Spinal stenosis, site unspecified: Secondary | ICD-10-CM | POA: Diagnosis not present

## 2017-03-30 DIAGNOSIS — M4800 Spinal stenosis, site unspecified: Secondary | ICD-10-CM | POA: Diagnosis not present

## 2017-03-31 DIAGNOSIS — M4800 Spinal stenosis, site unspecified: Secondary | ICD-10-CM | POA: Diagnosis not present

## 2017-04-01 ENCOUNTER — Ambulatory Visit
Admission: RE | Admit: 2017-04-01 | Discharge: 2017-04-01 | Disposition: A | Discharge status: Home / Self Care | Payer: Medicare HMO | Source: Ambulatory Visit | Attending: Family Medicine | Admitting: Family Medicine

## 2017-04-01 ENCOUNTER — Other Ambulatory Visit: Payer: Self-pay | Admitting: Family Medicine

## 2017-04-01 DIAGNOSIS — E7800 Pure hypercholesterolemia, unspecified: Secondary | ICD-10-CM | POA: Diagnosis not present

## 2017-04-01 DIAGNOSIS — R609 Edema, unspecified: Secondary | ICD-10-CM

## 2017-04-01 DIAGNOSIS — Z6841 Body Mass Index (BMI) 40.0 and over, adult: Secondary | ICD-10-CM | POA: Diagnosis not present

## 2017-04-01 DIAGNOSIS — M79672 Pain in left foot: Secondary | ICD-10-CM

## 2017-04-01 DIAGNOSIS — M7989 Other specified soft tissue disorders: Secondary | ICD-10-CM | POA: Diagnosis not present

## 2017-04-01 DIAGNOSIS — N183 Chronic kidney disease, stage 3 (moderate): Secondary | ICD-10-CM | POA: Diagnosis not present

## 2017-04-01 DIAGNOSIS — M109 Gout, unspecified: Secondary | ICD-10-CM | POA: Diagnosis not present

## 2017-04-01 DIAGNOSIS — I129 Hypertensive chronic kidney disease with stage 1 through stage 4 chronic kidney disease, or unspecified chronic kidney disease: Secondary | ICD-10-CM | POA: Diagnosis not present

## 2017-04-01 DIAGNOSIS — E039 Hypothyroidism, unspecified: Secondary | ICD-10-CM | POA: Diagnosis not present

## 2017-04-04 ENCOUNTER — Encounter: Payer: Self-pay | Admitting: Cardiology

## 2017-04-06 DIAGNOSIS — J45909 Unspecified asthma, uncomplicated: Secondary | ICD-10-CM | POA: Diagnosis not present

## 2017-04-06 DIAGNOSIS — R55 Syncope and collapse: Secondary | ICD-10-CM | POA: Diagnosis not present

## 2017-04-06 DIAGNOSIS — R0902 Hypoxemia: Secondary | ICD-10-CM | POA: Diagnosis not present

## 2017-04-06 DIAGNOSIS — I129 Hypertensive chronic kidney disease with stage 1 through stage 4 chronic kidney disease, or unspecified chronic kidney disease: Secondary | ICD-10-CM | POA: Diagnosis not present

## 2017-04-08 DIAGNOSIS — M4800 Spinal stenosis, site unspecified: Secondary | ICD-10-CM | POA: Diagnosis not present

## 2017-04-09 DIAGNOSIS — I129 Hypertensive chronic kidney disease with stage 1 through stage 4 chronic kidney disease, or unspecified chronic kidney disease: Secondary | ICD-10-CM | POA: Diagnosis not present

## 2017-04-09 DIAGNOSIS — J45909 Unspecified asthma, uncomplicated: Secondary | ICD-10-CM | POA: Diagnosis not present

## 2017-04-09 DIAGNOSIS — R0902 Hypoxemia: Secondary | ICD-10-CM | POA: Diagnosis not present

## 2017-04-09 DIAGNOSIS — R55 Syncope and collapse: Secondary | ICD-10-CM | POA: Diagnosis not present

## 2017-04-09 DIAGNOSIS — M4800 Spinal stenosis, site unspecified: Secondary | ICD-10-CM | POA: Diagnosis not present

## 2017-04-10 DIAGNOSIS — M4800 Spinal stenosis, site unspecified: Secondary | ICD-10-CM | POA: Diagnosis not present

## 2017-04-11 DIAGNOSIS — M4800 Spinal stenosis, site unspecified: Secondary | ICD-10-CM | POA: Diagnosis not present

## 2017-04-12 ENCOUNTER — Telehealth: Payer: Self-pay | Admitting: Pulmonary Disease

## 2017-04-12 DIAGNOSIS — M4800 Spinal stenosis, site unspecified: Secondary | ICD-10-CM | POA: Diagnosis not present

## 2017-04-12 NOTE — Telephone Encounter (Signed)
Split night sleep study was ordered on 02/20/17 at office visit.   PCCs please advise on scheduling for this sleep study with pt- this has not yet been scheduled and pt is inquiring about how to schedule this.  Thanks!

## 2017-04-12 NOTE — Telephone Encounter (Signed)
Pt was scheduled for sleep study on 1/27 and pt was a "no show".  There is a note on the appt from [facility] stating appt confirmed on 1/23.  I called pt & left her a vm with phone # for the Sleep Lab and made her aware she can call & reschedule the appt.

## 2017-04-12 NOTE — Telephone Encounter (Signed)
Ok noted  

## 2017-04-13 DIAGNOSIS — M4800 Spinal stenosis, site unspecified: Secondary | ICD-10-CM | POA: Diagnosis not present

## 2017-04-14 DIAGNOSIS — M4800 Spinal stenosis, site unspecified: Secondary | ICD-10-CM | POA: Diagnosis not present

## 2017-04-15 DIAGNOSIS — M4800 Spinal stenosis, site unspecified: Secondary | ICD-10-CM | POA: Diagnosis not present

## 2017-04-16 DIAGNOSIS — M4800 Spinal stenosis, site unspecified: Secondary | ICD-10-CM | POA: Diagnosis not present

## 2017-04-17 DIAGNOSIS — M4800 Spinal stenosis, site unspecified: Secondary | ICD-10-CM | POA: Diagnosis not present

## 2017-04-18 ENCOUNTER — Telehealth: Payer: Self-pay | Admitting: *Deleted

## 2017-04-18 DIAGNOSIS — M4800 Spinal stenosis, site unspecified: Secondary | ICD-10-CM | POA: Diagnosis not present

## 2017-04-18 NOTE — Telephone Encounter (Signed)
LMOVM (DPR) requesting that patient send a manual Carelink transmission for review.  Also requested call back to the [facility] and gave direct number.  Received alert for 2 "tachy" episodes.  Available ECG from 04/15/17 at 0939 suggests SVT vs. ST, duration 1min 8sec, patient active at time of episode per device.  Will review additional ECG when transmission received and discuss any symptoms when patient returns call.

## 2017-04-19 DIAGNOSIS — M4800 Spinal stenosis, site unspecified: Secondary | ICD-10-CM | POA: Diagnosis not present

## 2017-04-20 DIAGNOSIS — M4800 Spinal stenosis, site unspecified: Secondary | ICD-10-CM | POA: Diagnosis not present

## 2017-04-21 DIAGNOSIS — M4647 Discitis, unspecified, lumbosacral region: Secondary | ICD-10-CM | POA: Diagnosis not present

## 2017-04-21 DIAGNOSIS — J9611 Chronic respiratory failure with hypoxia: Secondary | ICD-10-CM | POA: Diagnosis not present

## 2017-04-21 DIAGNOSIS — J410 Simple chronic bronchitis: Secondary | ICD-10-CM | POA: Diagnosis not present

## 2017-04-21 DIAGNOSIS — M4800 Spinal stenosis, site unspecified: Secondary | ICD-10-CM | POA: Diagnosis not present

## 2017-04-21 DIAGNOSIS — E662 Morbid (severe) obesity with alveolar hypoventilation: Secondary | ICD-10-CM | POA: Diagnosis not present

## 2017-04-22 DIAGNOSIS — M4800 Spinal stenosis, site unspecified: Secondary | ICD-10-CM | POA: Diagnosis not present

## 2017-04-22 LAB — CUP PACEART REMOTE DEVICE CHECK
Date Time Interrogation Session: 20190215154504
Implantable Pulse Generator Implant Date: 20180420

## 2017-04-23 DIAGNOSIS — M4800 Spinal stenosis, site unspecified: Secondary | ICD-10-CM | POA: Diagnosis not present

## 2017-04-24 ENCOUNTER — Ambulatory Visit (INDEPENDENT_AMBULATORY_CARE_PROVIDER_SITE_OTHER): Payer: Medicare HMO | Admitting: *Deleted

## 2017-04-24 DIAGNOSIS — M4800 Spinal stenosis, site unspecified: Secondary | ICD-10-CM | POA: Diagnosis not present

## 2017-04-24 DIAGNOSIS — R55 Syncope and collapse: Secondary | ICD-10-CM | POA: Diagnosis not present

## 2017-04-24 NOTE — Telephone Encounter (Signed)
LMOVM requesting manual transmission from Carelink monitor.  Gave [facility] phone number for questions/concerns. 

## 2017-04-25 ENCOUNTER — Ambulatory Visit: Payer: Medicare HMO

## 2017-04-25 DIAGNOSIS — M4800 Spinal stenosis, site unspecified: Secondary | ICD-10-CM | POA: Diagnosis not present

## 2017-04-25 NOTE — Progress Notes (Signed)
Carelink Summary Report / Loop Recorder 

## 2017-04-26 DIAGNOSIS — M4800 Spinal stenosis, site unspecified: Secondary | ICD-10-CM | POA: Diagnosis not present

## 2017-04-27 DIAGNOSIS — M4800 Spinal stenosis, site unspecified: Secondary | ICD-10-CM | POA: Diagnosis not present

## 2017-04-28 DIAGNOSIS — M4800 Spinal stenosis, site unspecified: Secondary | ICD-10-CM | POA: Diagnosis not present

## 2017-04-29 DIAGNOSIS — M4800 Spinal stenosis, site unspecified: Secondary | ICD-10-CM | POA: Diagnosis not present

## 2017-04-30 ENCOUNTER — Telehealth: Payer: Self-pay | Admitting: Cardiology

## 2017-04-30 DIAGNOSIS — M4800 Spinal stenosis, site unspecified: Secondary | ICD-10-CM | POA: Diagnosis not present

## 2017-04-30 NOTE — Telephone Encounter (Signed)
LMOVM requesting that pt send manual transmission b/c home monitor has not updated in at least 14 days.    

## 2017-05-01 ENCOUNTER — Encounter: Payer: Self-pay | Admitting: Surgery

## 2017-05-01 ENCOUNTER — Ambulatory Visit (INDEPENDENT_AMBULATORY_CARE_PROVIDER_SITE_OTHER): Payer: Medicare HMO | Admitting: Surgery

## 2017-05-01 ENCOUNTER — Ambulatory Visit
Admission: RE | Admit: 2017-05-01 | Discharge: 2017-05-01 | Disposition: A | Discharge status: Home / Self Care | Payer: Medicare HMO | Source: Ambulatory Visit | Attending: Surgery | Admitting: Surgery

## 2017-05-01 VITALS — BP 114/56 | HR 72 | Resp 20 | Ht 63.0 in | Wt 265.0 lb

## 2017-05-01 DIAGNOSIS — I712 Thoracic aortic aneurysm, without rupture, unspecified: Secondary | ICD-10-CM

## 2017-05-01 DIAGNOSIS — M4800 Spinal stenosis, site unspecified: Secondary | ICD-10-CM | POA: Diagnosis not present

## 2017-05-01 DIAGNOSIS — I7121 Aneurysm of the ascending aorta, without rupture: Secondary | ICD-10-CM

## 2017-05-01 MED ORDER — IOPAMIDOL (ISOVUE-370) INJECTION 76%
75.0000 mL | Freq: Once | INTRAVENOUS | Status: AC | PRN
  Administered 2017-05-01: 75 mL via INTRAVENOUS

## 2017-05-01 NOTE — Progress Notes (Signed)
HPI:  The patient is a 72 year old woman with hypertension, hyperlipidemia, hypothyroidism, morbid obesity, and bipolar disorder who is on home oxygen for resting hypoxemia who returns for follow up of a 4.2 cm fusiform ascending aortic aneurysm. Her echo showed a trileaflet aortic valve with no stenosis or insufficiency. LVEF was 60-65%. She had grade 2 diastolic dysfunction. She denies any chest or back pain.   Current Outpatient Medications  Medication Sig Dispense Refill  . Aspirin-Salicylamide-Caffeine (BC HEADACHE POWDER PO) Take 1 packet by mouth as needed (headache, pain).    . Cholecalciferol (D3-1000) 1000 units capsule Take 1,000 Units by mouth daily.    . DULoxetine (CYMBALTA) 30 MG capsule Take 1 capsule (30 mg total) by mouth 2 (two) times daily. 60 capsule 0  . DULoxetine (CYMBALTA) 60 MG capsule Take 60 mg by mouth at bedtime.    [name]. levothyroxine (SYNTHROID, LEVOTHROID) 75 MCG tablet Take 75 mcg by mouth daily before breakfast.     . lisinopril (PRINIVIL,ZESTRIL) 10 MG tablet     . meloxicam (MOBIC) 15 MG tablet Take 15 mg by mouth daily.     . metoprolol succinate (TOPROL-XL) 25 MG 24 hr tablet     . omeprazole (PRILOSEC) 20 MG capsule Take 20 mg by mouth 2 (two) times daily before a meal.    . pravastatin (PRAVACHOL) 20 MG tablet Take 20 mg by mouth at bedtime.     . pregabalin (LYRICA) 100 MG capsule Take 1 capsule (100 mg total) by mouth 2 (two) times daily.    . SUMAtriptan (IMITREX) 100 MG tablet Take 100 mg by mouth as needed for headache. Take 100mg  at onset of headache, may repeat in 2 hours if needed. Do not exceed 2 doses in 24 hours.    . traZODone (DESYREL) 100 MG tablet Take 300 mg by mouth at bedtime.     . vitamin B-12 (CYANOCOBALAMIN) 1000 MCG tablet Take 1,000 mcg by mouth daily.     No current facility-administered medications for this visit.      Physical Exam: BP (!) 114/56   Pulse 72   Resp 20   Ht 5\' 3"  (1.6 m)   Wt 265 lb (120.2 kg)    SpO2 96% Comment: 2L O2 per [place]  BMI 46.94 kg/m  She is a morbidly obese woman who is on oxygen and uses a rolling walker Cardiac exam shows a regular rate and rhythm with normal S1 and S2.  There is no murmur. Lungs are clear.   Diagnostic Tests:  Her CTA of the chest from today has not been officially read by my review shows a 4.2 cm fusiform ascending aortic aneurysm.  There is no aortic dissection.  Impression:  She has a stable 4.2 cm fusiform ascending aortic aneurysm.  Her blood pressure is under good control she is on low-dose beta-blocker.  There is no indication for any surgical intervention at this time and I do not think she would be a candidate for any surgical treatment of this aneurysm in her current condition with morbid obesity and oxygen dependent lung disease.  I reviewed the CTA images with her and answered her questions.  This aneurysm has been stable over the past few years I think it is reasonable to wait 2 years to follow-up on this especially considering her general condition.  Plan:  I will plan to see her back in 2 years with a CTA of the chest to follow-up on her aneurysm.  I spent 15 minutes performing this established patient evaluation and > 50% of this time was spent face to face counseling and coordinating the care of this patient's aortic aneurysm.  Gaye Pollack, MD [facility] [phone]

## 2017-05-02 DIAGNOSIS — M4800 Spinal stenosis, site unspecified: Secondary | ICD-10-CM | POA: Diagnosis not present

## 2017-05-02 NOTE — Telephone Encounter (Signed)
Patient is scheduled to see Dr. Elberta Fortisamnitz on 05/03/17.  Will request manual transmission at this appointment.

## 2017-05-03 ENCOUNTER — Encounter: Payer: Medicare HMO | Admitting: Cardiology

## 2017-05-03 DIAGNOSIS — M4800 Spinal stenosis, site unspecified: Secondary | ICD-10-CM | POA: Diagnosis not present

## 2017-05-04 DIAGNOSIS — M4800 Spinal stenosis, site unspecified: Secondary | ICD-10-CM | POA: Diagnosis not present

## 2017-05-05 DIAGNOSIS — M4800 Spinal stenosis, site unspecified: Secondary | ICD-10-CM | POA: Diagnosis not present

## 2017-05-06 DIAGNOSIS — M4800 Spinal stenosis, site unspecified: Secondary | ICD-10-CM | POA: Diagnosis not present

## 2017-05-07 ENCOUNTER — Encounter: Payer: Self-pay | Admitting: Cardiology

## 2017-05-07 ENCOUNTER — Encounter: Payer: Self-pay | Admitting: *Deleted

## 2017-05-07 DIAGNOSIS — M4800 Spinal stenosis, site unspecified: Secondary | ICD-10-CM | POA: Diagnosis not present

## 2017-05-07 DIAGNOSIS — R0902 Hypoxemia: Secondary | ICD-10-CM | POA: Diagnosis not present

## 2017-05-07 DIAGNOSIS — J45909 Unspecified asthma, uncomplicated: Secondary | ICD-10-CM | POA: Diagnosis not present

## 2017-05-07 DIAGNOSIS — R55 Syncope and collapse: Secondary | ICD-10-CM | POA: Diagnosis not present

## 2017-05-07 DIAGNOSIS — I129 Hypertensive chronic kidney disease with stage 1 through stage 4 chronic kidney disease, or unspecified chronic kidney disease: Secondary | ICD-10-CM | POA: Diagnosis not present

## 2017-05-07 NOTE — Telephone Encounter (Signed)
Patient rescheduled appointment with Dr. Elberta Fortisamnitz to 05/20/17.  Letter and instructions mailed to patient to request manual transmission.

## 2017-05-08 DIAGNOSIS — M4800 Spinal stenosis, site unspecified: Secondary | ICD-10-CM | POA: Diagnosis not present

## 2017-05-09 DIAGNOSIS — M4800 Spinal stenosis, site unspecified: Secondary | ICD-10-CM | POA: Diagnosis not present

## 2017-05-10 DIAGNOSIS — R55 Syncope and collapse: Secondary | ICD-10-CM | POA: Diagnosis not present

## 2017-05-10 DIAGNOSIS — R0902 Hypoxemia: Secondary | ICD-10-CM | POA: Diagnosis not present

## 2017-05-10 DIAGNOSIS — I129 Hypertensive chronic kidney disease with stage 1 through stage 4 chronic kidney disease, or unspecified chronic kidney disease: Secondary | ICD-10-CM | POA: Diagnosis not present

## 2017-05-10 DIAGNOSIS — M4800 Spinal stenosis, site unspecified: Secondary | ICD-10-CM | POA: Diagnosis not present

## 2017-05-10 DIAGNOSIS — J45909 Unspecified asthma, uncomplicated: Secondary | ICD-10-CM | POA: Diagnosis not present

## 2017-05-11 DIAGNOSIS — M4800 Spinal stenosis, site unspecified: Secondary | ICD-10-CM | POA: Diagnosis not present

## 2017-05-12 DIAGNOSIS — M4800 Spinal stenosis, site unspecified: Secondary | ICD-10-CM | POA: Diagnosis not present

## 2017-05-13 ENCOUNTER — Ambulatory Visit (HOSPITAL_BASED_OUTPATIENT_CLINIC_OR_DEPARTMENT_OTHER): Payer: Medicare HMO | Attending: Pulmonary Disease | Admitting: Pulmonary Disease

## 2017-05-13 VITALS — Ht 63.0 in | Wt 269.0 lb

## 2017-05-13 DIAGNOSIS — J9612 Chronic respiratory failure with hypercapnia: Secondary | ICD-10-CM

## 2017-05-13 DIAGNOSIS — J9610 Chronic respiratory failure, unspecified whether with hypoxia or hypercapnia: Secondary | ICD-10-CM

## 2017-05-13 DIAGNOSIS — E669 Obesity, unspecified: Secondary | ICD-10-CM | POA: Insufficient documentation

## 2017-05-13 DIAGNOSIS — J9611 Chronic respiratory failure with hypoxia: Secondary | ICD-10-CM | POA: Diagnosis not present

## 2017-05-13 DIAGNOSIS — E662 Morbid (severe) obesity with alveolar hypoventilation: Secondary | ICD-10-CM

## 2017-05-13 DIAGNOSIS — G4730 Sleep apnea, unspecified: Secondary | ICD-10-CM | POA: Insufficient documentation

## 2017-05-13 DIAGNOSIS — M4800 Spinal stenosis, site unspecified: Secondary | ICD-10-CM | POA: Diagnosis not present

## 2017-05-14 DIAGNOSIS — M4800 Spinal stenosis, site unspecified: Secondary | ICD-10-CM | POA: Diagnosis not present

## 2017-05-15 DIAGNOSIS — M4800 Spinal stenosis, site unspecified: Secondary | ICD-10-CM | POA: Diagnosis not present

## 2017-05-16 DIAGNOSIS — M1712 Unilateral primary osteoarthritis, left knee: Secondary | ICD-10-CM | POA: Diagnosis not present

## 2017-05-16 DIAGNOSIS — M25561 Pain in right knee: Secondary | ICD-10-CM | POA: Diagnosis not present

## 2017-05-16 DIAGNOSIS — M4800 Spinal stenosis, site unspecified: Secondary | ICD-10-CM | POA: Diagnosis not present

## 2017-05-16 DIAGNOSIS — M1711 Unilateral primary osteoarthritis, right knee: Secondary | ICD-10-CM | POA: Diagnosis not present

## 2017-05-16 DIAGNOSIS — M25562 Pain in left knee: Secondary | ICD-10-CM | POA: Diagnosis not present

## 2017-05-16 DIAGNOSIS — M79661 Pain in right lower leg: Secondary | ICD-10-CM | POA: Diagnosis not present

## 2017-05-17 DIAGNOSIS — M4800 Spinal stenosis, site unspecified: Secondary | ICD-10-CM | POA: Diagnosis not present

## 2017-05-18 DIAGNOSIS — M4800 Spinal stenosis, site unspecified: Secondary | ICD-10-CM | POA: Diagnosis not present

## 2017-05-19 DIAGNOSIS — M4800 Spinal stenosis, site unspecified: Secondary | ICD-10-CM | POA: Diagnosis not present

## 2017-05-20 ENCOUNTER — Encounter: Payer: Self-pay | Admitting: Cardiology

## 2017-05-20 ENCOUNTER — Ambulatory Visit (INDEPENDENT_AMBULATORY_CARE_PROVIDER_SITE_OTHER): Payer: Medicare HMO | Admitting: Cardiology

## 2017-05-20 VITALS — BP 134/78 | HR 82 | Ht 63.0 in | Wt 267.0 lb

## 2017-05-20 DIAGNOSIS — E785 Hyperlipidemia, unspecified: Secondary | ICD-10-CM | POA: Diagnosis not present

## 2017-05-20 DIAGNOSIS — R55 Syncope and collapse: Secondary | ICD-10-CM

## 2017-05-20 DIAGNOSIS — I10 Essential (primary) hypertension: Secondary | ICD-10-CM | POA: Diagnosis not present

## 2017-05-20 DIAGNOSIS — M4800 Spinal stenosis, site unspecified: Secondary | ICD-10-CM | POA: Diagnosis not present

## 2017-05-20 DIAGNOSIS — I5032 Chronic diastolic (congestive) heart failure: Secondary | ICD-10-CM | POA: Diagnosis not present

## 2017-05-20 LAB — CUP PACEART INCLINIC DEVICE CHECK
Date Time Interrogation Session: 20190415155649
Implantable Pulse Generator Implant Date: 20180420

## 2017-05-20 MED ORDER — METOPROLOL SUCCINATE ER 50 MG PO TB24: 50.0000 mg | ORAL_TABLET | Freq: Every day | ORAL | 3 refills | Status: DC

## 2017-05-20 NOTE — Progress Notes (Signed)
Electrophysiology Office Note   Date:  05/20/2017   ID:  Lindsey Rowe, DOB 1945/09/20, MRN [id number]  PCP:  Maurice Small, MD  Cardiologist:  Tresa Endo Primary Electrophysiologist:  Lathon Adan Jorja Loa, MD    Chief Complaint  Patient presents with  . Pacemaker Check    Loop recorder/Syncope/collapse     History of Present Illness: Lindsey Rowe is a 72 y.o. female who is being seen today for the evaluation of syncope at the request of Maurice Small, MD. Presenting today for electrophysiology evaluation. She had an episode of syncope and presented to the hospital with a right shoulder dislocation. She had had multiple episodes of syncope and thus a Linq monitor was implanted. Since that time, she is felt well without complaint.  Today, denies symptoms of palpitations, chest pain, shortness of breath, orthopnea, PND,claudication, dizziness, presyncope, syncope, bleeding, or neurologic sequela. The patient is tolerating medications without difficulties.  Overall she is feeling well.  She has noted no further episodes of syncope.  She does have occasional palpitations.  Her most recent episode was last weekend when she was cleaning her house.  She feels that she overexerted herself.  She has been having intermittent lower extremity edema.  This occurs mostly in the end of the day.  She raises her feet which greatly improves her symptoms.  She has not noted any major weight change.  She does not on a scale.   Past Medical History:  Diagnosis Date  . Arthritis   . Back pain   . Bipolar 1 disorder ([facility])   . Complication of anesthesia    hard to wake up anesthesia  . Depression   . GERD (gastroesophageal reflux disease)   . Headache(784.0)    migraines and tension headaches  . Hyperlipidemia   . Hypertension   . Hypothyroidism   . Thyroid disease    Past Surgical History:  Procedure Laterality Date  . APPENDECTOMY    . BACK SURGERY    . EYE SURGERY Bilateral    lens  implant  . GASTRIC BYPASS    . LOOP RECORDER INSERTION N/A 05/25/2016   Procedure: Loop Recorder Insertion;  Surgeon: Nalee Lightle Jorja Loa, MD;  Location: [facility];  Service: Cardiovascular;  Laterality: N/A;  . LUMBAR LAMINECTOMY/DECOMPRESSION MICRODISCECTOMY Right 07/23/2012   Procedure: LUMBAR LAMINECTOMY/DECOMPRESSION MICRODISCECTOMY 2 LEVELS;  Surgeon: Karn Cassis, MD;  Location: [facility];  Service: Neurosurgery;  Laterality: Right;  Right Lumbar three-four  lumbar four-five Laminectomy/Foraminotomy  . NASAL SINUS SURGERY Bilateral   . TONSILLECTOMY    . WRIST SURGERY       Current Outpatient Medications  Medication Sig Dispense Refill  . Aspirin-Salicylamide-Caffeine (BC HEADACHE POWDER PO) Take 1 packet by mouth as needed (headache, pain).    . Cholecalciferol (D3-1000) 1000 units capsule Take 1,000 Units by mouth daily.    . DULoxetine (CYMBALTA) 30 MG capsule Take 1 capsule (30 mg total) by mouth 2 (two) times daily. 60 capsule 0  . DULoxetine (CYMBALTA) 60 MG capsule Take 60 mg by mouth at bedtime.    [name] levothyroxine (SYNTHROID, LEVOTHROID) 75 MCG tablet Take 75 mcg by mouth daily before breakfast.     . lisinopril (PRINIVIL,ZESTRIL) 10 MG tablet     . meloxicam (MOBIC) 15 MG tablet Take 15 mg by mouth daily.     . metoprolol succinate (TOPROL-XL) 25 MG 24 hr tablet     . omeprazole (PRILOSEC) 20 MG capsule Take 20 mg by mouth  2 (two) times daily before a meal.    . pravastatin (PRAVACHOL) 20 MG tablet Take 20 mg by mouth at bedtime.     . pregabalin (LYRICA) 100 MG capsule Take 1 capsule (100 mg total) by mouth 2 (two) times daily.    . SUMAtriptan (IMITREX) 100 MG tablet Take 100 mg by mouth as needed for headache. Take 100mg  at onset of headache, may repeat in 2 hours if needed. Do not exceed 2 doses in 24 hours.    . traZODone (DESYREL) 100 MG tablet Take 300 mg by mouth at bedtime.     . vitamin B-12 (CYANOCOBALAMIN) 1000 MCG tablet Take 1,000 mcg by mouth  daily.     No current facility-administered medications for this visit.     Allergies:   Gabapentin and Cefadroxil   Social History:  The patient  reports that she has never smoked. She has never used smokeless tobacco. She reports that she drinks alcohol. She reports that she does not use drugs.   Family History:  The patient's family history includes Diabetes in her sister; Emphysema in her father; Heart disease in her mother; Other in her brother.   ROS:  Please see the history of present illness.   Otherwise, review of systems is positive for type change, leg swelling.   All other systems are reviewed and negative.   PHYSICAL EXAM: VS:  BP 134/78   Pulse 82   Ht 5\' 3"  (1.6 m)   Wt 267 lb (121.1 kg)   SpO2 95%   BMI 47.30 kg/m  , BMI Body mass index is 47.3 kg/m. GEN: Well nourished, well developed, in no acute distress  HEENT: normal  Neck: no JVD, carotid bruits, or masses Cardiac: RRR; no murmurs, rubs, or gallops,no edema  Respiratory:  clear to auscultation bilaterally, normal work of breathing GI: soft, nontender, nondistended, + BS MS: no deformity or atrophy  Skin: warm and dry, device site well healed Neuro:  Strength and sensation are intact Psych: euthymic mood, full affect  EKG:  EKG is not ordered today. Personal review of the ekg ordered 03/03/17 shows sinus rhythm  Personal review of the device interrogation today. Results in [facility]   Recent Labs: 05/24/2016: ALT 12 03/03/2017: BUN 18; Creatinine, Ser 0.83; Hemoglobin 12.1; Platelets 199; Potassium 4.4; Sodium 141    Lipid Panel     Component Value Date/Time   CHOL 196 11/22/2009 2149   TRIG 156 (H) 11/22/2009 2149   HDL 45 11/22/2009 2149   CHOLHDL 4.4 Ratio 11/22/2009 2149   VLDL 31 11/22/2009 2149   LDLCALC 120 (H) 11/22/2009 2149     Wt Readings from Last 3 Encounters:  05/20/17 267 lb (121.1 kg)  05/13/17 269 lb (122 kg)  05/01/17 265 lb (120.2 kg)      Other studies  Reviewed: Additional studies/ records that were reviewed today include: TTE 03/24/16  Review of the above records today demonstrates:  - Left ventricle: The cavity size was normal. Wall thickness was   normal. Systolic function was normal. The estimated ejection   fraction was in the range of 60% to 65%. Wall motion was normal;   there were no regional wall motion abnormalities. Features are   consistent with a pseudonormal left ventricular filling pattern,   with concomitant abnormal relaxation and increased filling   pressure (grade 2 diastolic dysfunction). - Aortic valve: There was no stenosis. - Mitral valve: There was no significant regurgitation. - Right ventricle: The cavity size was  normal. Systolic function   was normal. - Tricuspid valve: Peak RV-RA gradient (S): 28 mm Hg. - Pulmonary arteries: PA peak pressure: 31 mm Hg (S). - Inferior vena cava: The vessel was normal in size. The   respirophasic diameter changes were in the normal range (>= 50%),   consistent with normal central venous pressure.   ASSESSMENT AND PLAN:  1.  Recurrent syncope: None since link monitor was implanted.  Is having episodes of SVT on the monitor.  Viaan Knippenberg increase Toprol-XL to 50 mg.  2. Hypertension: Well-controlled today.  No changes.  3. Hyperlipidemia: Continue pravastatin  4.  Chronic diastolic heart failure: Echo shows an EF of 60-65% with grade 2 diastolic dysfunction.  She does get lower extremity edema towards the end of the day which improves as the day goes on.  I have asked her to get a scale to weigh herself daily.  If her weight changes, we Dhruva Orndoff plan to start Lasix.  Current medicines are reviewed at length with the patient today.   The patient does not have concerns regarding her medicines.  The following changes were made today: Increase Toprol-XL  Labs/ tests ordered today include:  No orders of the defined types were placed in this encounter.    Disposition:   FU with Georgio Hattabaugh 1 year  Signed, Whit Bruni Jorja Loa, MD  05/20/2017 11:57 AM     [facility] [address] [place] [address] [phone] (office) [phone] (fax)

## 2017-05-20 NOTE — Addendum Note (Signed)
Addended by: Baird LyonsPRICE, Maura Braaten L on: 05/20/2017 12:14 PM   Modules accepted: Orders

## 2017-05-20 NOTE — Patient Instructions (Addendum)
Medication Instructions:  Your physician has recommended you make the following change in your medication:  1. INCREASE Toprol to 50 mg daily  Labwork: None ordered  Testing/Procedures: None ordered  Follow-Up: Continue with monthly monitoring from home.  Your physician wants you to follow-up in: 1 year with Dr. Elberta Fortisamnitz.  You will receive a reminder letter in the mail two months in advance. If you don't receive a letter, please call our office to schedule the follow-up appointment.  * If you need a refill on your cardiac medications before your next appointment, please call your pharmacy.   *Please note that any paperwork needing to be filled out by the provider will need to be addressed at the front desk prior to seeing the provider. Please note that any FMLA, disability or other documents regarding health condition is subject to a $25.00 charge that must be received prior to completion of paperwork in the form of a money order or check.  Thank you for choosing [facility]!!   Dory HornSherri Ariz Terrones, RN [phone]

## 2017-05-21 ENCOUNTER — Telehealth: Payer: Self-pay | Admitting: Pulmonary Disease

## 2017-05-21 DIAGNOSIS — M4800 Spinal stenosis, site unspecified: Secondary | ICD-10-CM | POA: Diagnosis not present

## 2017-05-21 NOTE — Telephone Encounter (Signed)
Called patient, unable to reach left message to give us a call back.   Per MW patient needs UC visit or to be seen here.

## 2017-05-21 NOTE — Telephone Encounter (Signed)
Routing to MW as VS is off   Please advise, thanks

## 2017-05-21 NOTE — Telephone Encounter (Signed)
Spoke with patient, she states that when she over exerts herself or tries to breath out for a long period of time she gets winded. Patient states that she thinks this is allergies and wants to make sure that her lungs are clear. Patient had to leave due to having something she needed to do and she could not wait any longer. Advised her that I would send a message to Dr. Craige CottaSood to see what he suggests.   VS please advise, thanks.

## 2017-05-22 ENCOUNTER — Other Ambulatory Visit: Payer: Self-pay | Admitting: Cardiology

## 2017-05-22 DIAGNOSIS — J410 Simple chronic bronchitis: Secondary | ICD-10-CM | POA: Diagnosis not present

## 2017-05-22 DIAGNOSIS — M4647 Discitis, unspecified, lumbosacral region: Secondary | ICD-10-CM | POA: Diagnosis not present

## 2017-05-22 DIAGNOSIS — J9611 Chronic respiratory failure with hypoxia: Secondary | ICD-10-CM | POA: Diagnosis not present

## 2017-05-22 DIAGNOSIS — E662 Morbid (severe) obesity with alveolar hypoventilation: Secondary | ICD-10-CM | POA: Diagnosis not present

## 2017-05-22 DIAGNOSIS — M4800 Spinal stenosis, site unspecified: Secondary | ICD-10-CM | POA: Diagnosis not present

## 2017-05-23 DIAGNOSIS — M4800 Spinal stenosis, site unspecified: Secondary | ICD-10-CM | POA: Diagnosis not present

## 2017-05-23 NOTE — Telephone Encounter (Signed)
Attempted to contact pt. Call went straight to voicemail. I have left a message for the pt to return our call. 

## 2017-05-24 DIAGNOSIS — M4800 Spinal stenosis, site unspecified: Secondary | ICD-10-CM | POA: Diagnosis not present

## 2017-05-25 DIAGNOSIS — M4800 Spinal stenosis, site unspecified: Secondary | ICD-10-CM | POA: Diagnosis not present

## 2017-05-25 DIAGNOSIS — M79606 Pain in leg, unspecified: Secondary | ICD-10-CM | POA: Insufficient documentation

## 2017-05-26 DIAGNOSIS — G4730 Sleep apnea, unspecified: Secondary | ICD-10-CM

## 2017-05-26 DIAGNOSIS — M4800 Spinal stenosis, site unspecified: Secondary | ICD-10-CM | POA: Diagnosis not present

## 2017-05-26 DIAGNOSIS — E662 Morbid (severe) obesity with alveolar hypoventilation: Secondary | ICD-10-CM | POA: Diagnosis not present

## 2017-05-26 DIAGNOSIS — J9611 Chronic respiratory failure with hypoxia: Secondary | ICD-10-CM | POA: Diagnosis not present

## 2017-05-26 DIAGNOSIS — J9612 Chronic respiratory failure with hypercapnia: Secondary | ICD-10-CM

## 2017-05-26 DIAGNOSIS — J9610 Chronic respiratory failure, unspecified whether with hypoxia or hypercapnia: Secondary | ICD-10-CM

## 2017-05-26 NOTE — Procedures (Signed)
   Patient Name: Terisa Starrerry, Cylinda Study Date: 05/13/2017 Gender: Female D.O.B: 1945-09-17 Age (years): [age] Referring Provider: Coralyn HellingVineet Amiylah Anastos MD, ABSM Height (inches): 63 Interpreting Physician: Coralyn HellingVineet Axle Parfait MD, ABSM Weight (lbs): 269 RPSGT: Lise AuerGibson,RPSGT, Theresa BMI: 48 MRN: [id number] Neck Size: 16.00  CLINICAL INFORMATION Sleep Study Type: NPSG  Indication for sleep study: Snoring, obesity, right diaphragm dysfunction, chronic hypoxic respiratory failure.  Epworth Sleepiness Score: 1  SLEEP STUDY TECHNIQUE As per the AASM Manual for the Scoring of Sleep and Associated Events v2.3 (April 2016) with a hypopnea requiring 4% desaturations.  The channels recorded and monitored were frontal, central and occipital EEG, electrooculogram (EOG), submentalis EMG (chin), nasal and oral airflow, thoracic and abdominal wall motion, anterior tibialis EMG, snore microphone, electrocardiogram, and pulse oximetry.  MEDICATIONS Medications self-administered by patient taken the night of the study : TRAZADONE  SLEEP ARCHITECTURE The study was initiated at 11:24:26 PM and ended at 5:32:30 AM.  Sleep onset time was 147.4 minutes and the sleep efficiency was 58.7%%. The total sleep time was 216.0 minutes.  Stage REM latency was N/A minutes.  The patient spent 2.5%% of the night in stage N1 sleep, 92.8%% in stage N2 sleep, 4.6%% in stage N3 and 0.00% in REM.  Alpha intrusion was absent.  Supine sleep was 0.00%.  RESPIRATORY PARAMETERS The overall apnea/hypopnea index (AHI) was 0.6 per hour. There were 0 total apneas, including 0 obstructive, 0 central and 0 mixed apneas. There were 2 hypopneas and 35 RERAs.  The AHI during Stage REM sleep was N/A per hour.  AHI while supine was N/A per hour.  The mean oxygen saturation was 95.8%. The minimum SpO2 during sleep was 90.0%.  The study was conducted with patient using baseline 2 liters oxygen.  moderate snoring was noted during this  study.  CARDIAC DATA The 2 lead EKG demonstrated sinus rhythm. The mean heart rate was 90.1 beats per minute. Other EKG findings include: None.  LEG MOVEMENT DATA The total PLMS were 0 with a resulting PLMS index of 0.0. Associated arousal with leg movement index was 5.6 .  IMPRESSIONS - It is difficult to determine whether she has sleep apnea.  Her AHI was 0.6, but her RDI 10.3.  The study was conducted with her using 2 liters supplemental oxygen.  It is possible that should would have significant obstructive sleep apnea if the study was started on room air.  Also, she did not have REM sleep during this study, and therefore the severity of sleep apnea could be underestimated.  DIAGNOSIS - Sleep Apnea, unspecified. - Nocturnal Hypoxemia.  RECOMMENDATIONS - Continue 2 liters supplemental oxygen with sleep. - Follow up in sleep medicine clinic, and reassess whether she needs repeat sleep study.  If she does, then the study should be started on room air and add supplemental oxygen in only as needed.  [Electronically signed] 05/26/2017 10:56 AM  Coralyn HellingVineet Quincee Gittens MD, ABSM Diplomate, American Board of Sleep Medicine NPI: [id number]

## 2017-05-26 NOTE — Telephone Encounter (Signed)
PSG 05/13/17 >> AHI 0.6, RDI 10.3, SpO2 low 90%.  Used 2 liters oxygen.  No REM, delayed sleep onset.   Please let her know that her sleep study showed very mild sleep apnea.  She should continue using 2 liters oxygen while asleep.    Please schedule ROV with me to review test results in more detail.  Need to also reschedule her PFT.

## 2017-05-27 ENCOUNTER — Ambulatory Visit (INDEPENDENT_AMBULATORY_CARE_PROVIDER_SITE_OTHER): Payer: Medicare HMO | Admitting: *Deleted

## 2017-05-27 DIAGNOSIS — R55 Syncope and collapse: Secondary | ICD-10-CM

## 2017-05-27 DIAGNOSIS — M79661 Pain in right lower leg: Secondary | ICD-10-CM | POA: Diagnosis not present

## 2017-05-27 DIAGNOSIS — M4800 Spinal stenosis, site unspecified: Secondary | ICD-10-CM | POA: Diagnosis not present

## 2017-05-27 NOTE — Telephone Encounter (Signed)
Attempted to call pt but while call was dialing, I then got a busy signal and was unable to leave a VM.  Will try to call back later.

## 2017-05-27 NOTE — Progress Notes (Signed)
Carelink Summary Report / Loop Recorder 

## 2017-05-28 DIAGNOSIS — M4800 Spinal stenosis, site unspecified: Secondary | ICD-10-CM | POA: Diagnosis not present

## 2017-05-28 DIAGNOSIS — F331 Major depressive disorder, recurrent, moderate: Secondary | ICD-10-CM | POA: Diagnosis not present

## 2017-05-29 DIAGNOSIS — M4800 Spinal stenosis, site unspecified: Secondary | ICD-10-CM | POA: Diagnosis not present

## 2017-05-29 NOTE — Telephone Encounter (Signed)
I called pt, the phone rang until there was a dial tone and I was unable to leave message. Will try again later.

## 2017-05-30 DIAGNOSIS — M4800 Spinal stenosis, site unspecified: Secondary | ICD-10-CM | POA: Diagnosis not present

## 2017-05-30 NOTE — Telephone Encounter (Signed)
Attempted to contact pt. I did not receive an answer. There was no option for me to leave a message. Will try back.  

## 2017-05-31 DIAGNOSIS — M4800 Spinal stenosis, site unspecified: Secondary | ICD-10-CM | POA: Diagnosis not present

## 2017-05-31 NOTE — Telephone Encounter (Signed)
We have attempted to contact the pt several times with no success or call back from the pt. Per triage protocol, message will be closed.  

## 2017-06-01 DIAGNOSIS — M4800 Spinal stenosis, site unspecified: Secondary | ICD-10-CM | POA: Diagnosis not present

## 2017-06-02 DIAGNOSIS — M4800 Spinal stenosis, site unspecified: Secondary | ICD-10-CM | POA: Diagnosis not present

## 2017-06-02 LAB — CUP PACEART REMOTE DEVICE CHECK
Date Time Interrogation Session: 20190320181123
Implantable Pulse Generator Implant Date: 20180420

## 2017-06-03 DIAGNOSIS — M4800 Spinal stenosis, site unspecified: Secondary | ICD-10-CM | POA: Diagnosis not present

## 2017-06-03 DIAGNOSIS — M79604 Pain in right leg: Secondary | ICD-10-CM | POA: Diagnosis not present

## 2017-06-04 ENCOUNTER — Telehealth: Payer: Self-pay | Admitting: Cardiology

## 2017-06-04 DIAGNOSIS — M4800 Spinal stenosis, site unspecified: Secondary | ICD-10-CM | POA: Diagnosis not present

## 2017-06-04 NOTE — Telephone Encounter (Signed)
LMOVM requesting that pt send manual transmission b/c home monitor has not updated in at least 14 days.    

## 2017-06-05 ENCOUNTER — Ambulatory Visit: Payer: Medicare HMO

## 2017-06-05 DIAGNOSIS — M4800 Spinal stenosis, site unspecified: Secondary | ICD-10-CM | POA: Diagnosis not present

## 2017-06-06 DIAGNOSIS — R55 Syncope and collapse: Secondary | ICD-10-CM | POA: Diagnosis not present

## 2017-06-06 DIAGNOSIS — R0902 Hypoxemia: Secondary | ICD-10-CM | POA: Diagnosis not present

## 2017-06-06 DIAGNOSIS — I129 Hypertensive chronic kidney disease with stage 1 through stage 4 chronic kidney disease, or unspecified chronic kidney disease: Secondary | ICD-10-CM | POA: Diagnosis not present

## 2017-06-06 DIAGNOSIS — J45909 Unspecified asthma, uncomplicated: Secondary | ICD-10-CM | POA: Diagnosis not present

## 2017-06-06 DIAGNOSIS — M4800 Spinal stenosis, site unspecified: Secondary | ICD-10-CM | POA: Diagnosis not present

## 2017-06-07 DIAGNOSIS — M4800 Spinal stenosis, site unspecified: Secondary | ICD-10-CM | POA: Diagnosis not present

## 2017-06-08 DIAGNOSIS — M4800 Spinal stenosis, site unspecified: Secondary | ICD-10-CM | POA: Diagnosis not present

## 2017-06-09 ENCOUNTER — Encounter (HOSPITAL_COMMUNITY): Payer: Self-pay | Admitting: Emergency Medicine

## 2017-06-09 ENCOUNTER — Emergency Department (HOSPITAL_COMMUNITY)
Admission: EM | Admit: 2017-06-09 | Discharge: 2017-06-10 | Disposition: A | Discharge status: Home / Self Care | Payer: Medicare HMO | Attending: Emergency Medicine | Admitting: Emergency Medicine

## 2017-06-09 ENCOUNTER — Other Ambulatory Visit: Payer: Self-pay

## 2017-06-09 ENCOUNTER — Emergency Department (HOSPITAL_COMMUNITY): Payer: Medicare HMO

## 2017-06-09 DIAGNOSIS — I10 Essential (primary) hypertension: Secondary | ICD-10-CM | POA: Diagnosis not present

## 2017-06-09 DIAGNOSIS — R4182 Altered mental status, unspecified: Secondary | ICD-10-CM | POA: Diagnosis not present

## 2017-06-09 DIAGNOSIS — F319 Bipolar disorder, unspecified: Secondary | ICD-10-CM | POA: Diagnosis not present

## 2017-06-09 DIAGNOSIS — R451 Restlessness and agitation: Secondary | ICD-10-CM

## 2017-06-09 DIAGNOSIS — R410 Disorientation, unspecified: Secondary | ICD-10-CM | POA: Diagnosis not present

## 2017-06-09 DIAGNOSIS — Z79899 Other long term (current) drug therapy: Secondary | ICD-10-CM | POA: Diagnosis not present

## 2017-06-09 DIAGNOSIS — T50905A Adverse effect of unspecified drugs, medicaments and biological substances, initial encounter: Secondary | ICD-10-CM | POA: Insufficient documentation

## 2017-06-09 DIAGNOSIS — F410 Panic disorder [episodic paroxysmal anxiety] without agoraphobia: Secondary | ICD-10-CM | POA: Diagnosis not present

## 2017-06-09 DIAGNOSIS — Y829 Unspecified medical devices associated with adverse incidents: Secondary | ICD-10-CM | POA: Insufficient documentation

## 2017-06-09 DIAGNOSIS — R441 Visual hallucinations: Secondary | ICD-10-CM

## 2017-06-09 DIAGNOSIS — R443 Hallucinations, unspecified: Secondary | ICD-10-CM | POA: Diagnosis not present

## 2017-06-09 DIAGNOSIS — E039 Hypothyroidism, unspecified: Secondary | ICD-10-CM | POA: Insufficient documentation

## 2017-06-09 DIAGNOSIS — R442 Other hallucinations: Secondary | ICD-10-CM | POA: Insufficient documentation

## 2017-06-09 DIAGNOSIS — F419 Anxiety disorder, unspecified: Secondary | ICD-10-CM | POA: Diagnosis not present

## 2017-06-09 DIAGNOSIS — T887XXA Unspecified adverse effect of drug or medicament, initial encounter: Secondary | ICD-10-CM | POA: Insufficient documentation

## 2017-06-09 DIAGNOSIS — R0902 Hypoxemia: Secondary | ICD-10-CM | POA: Diagnosis not present

## 2017-06-09 DIAGNOSIS — R450 Nervousness: Secondary | ICD-10-CM | POA: Diagnosis not present

## 2017-06-09 DIAGNOSIS — R402441 Other coma, without documented Glasgow coma scale score, or with partial score reported, in the field [EMT or ambulance]: Secondary | ICD-10-CM | POA: Diagnosis not present

## 2017-06-09 DIAGNOSIS — I129 Hypertensive chronic kidney disease with stage 1 through stage 4 chronic kidney disease, or unspecified chronic kidney disease: Secondary | ICD-10-CM | POA: Diagnosis not present

## 2017-06-09 DIAGNOSIS — G4700 Insomnia, unspecified: Secondary | ICD-10-CM | POA: Diagnosis not present

## 2017-06-09 DIAGNOSIS — J45909 Unspecified asthma, uncomplicated: Secondary | ICD-10-CM | POA: Diagnosis not present

## 2017-06-09 DIAGNOSIS — Z560 Unemployment, unspecified: Secondary | ICD-10-CM | POA: Diagnosis not present

## 2017-06-09 DIAGNOSIS — R55 Syncope and collapse: Secondary | ICD-10-CM | POA: Diagnosis not present

## 2017-06-09 DIAGNOSIS — M4800 Spinal stenosis, site unspecified: Secondary | ICD-10-CM | POA: Diagnosis not present

## 2017-06-09 LAB — CBC WITH DIFFERENTIAL/PLATELET
Basophils Absolute: 0 10*3/uL (ref 0.0–0.1)
Basophils Relative: 0 %
Eosinophils Absolute: 0.1 10*3/uL (ref 0.0–0.7)
Eosinophils Relative: 1 %
HCT: 39.5 % (ref 36.0–46.0)
Hemoglobin: 12.2 g/dL (ref 12.0–15.0)
Lymphocytes Relative: 24 %
Lymphs Abs: 2.2 10*3/uL (ref 0.7–4.0)
MCH: 30.2 pg (ref 26.0–34.0)
MCHC: 30.9 g/dL (ref 30.0–36.0)
MCV: 97.8 fL (ref 78.0–100.0)
Monocytes Absolute: 0.6 10*3/uL (ref 0.1–1.0)
Monocytes Relative: 7 %
Neutro Abs: 6.3 10*3/uL (ref 1.7–7.7)
Neutrophils Relative %: 68 %
Platelets: 215 10*3/uL (ref 150–400)
RBC: 4.04 MIL/uL (ref 3.87–5.11)
RDW: 13.9 % (ref 11.5–15.5)
WBC: 9.1 10*3/uL (ref 4.0–10.5)

## 2017-06-09 LAB — URINALYSIS, ROUTINE W REFLEX MICROSCOPIC
Bilirubin Urine: NEGATIVE
Glucose, UA: NEGATIVE mg/dL
Hgb urine dipstick: NEGATIVE
Ketones, ur: 5 mg/dL — AB
Leukocytes, UA: NEGATIVE
Nitrite: NEGATIVE
Protein, ur: NEGATIVE mg/dL
Specific Gravity, Urine: 1.018 (ref 1.005–1.030)
pH: 5 (ref 5.0–8.0)

## 2017-06-09 LAB — COMPREHENSIVE METABOLIC PANEL
ALT: 14 U/L (ref 14–54)
AST: 22 U/L (ref 15–41)
Albumin: 3.7 g/dL (ref 3.5–5.0)
Alkaline Phosphatase: 63 U/L (ref 38–126)
Anion gap: 11 (ref 5–15)
BUN: 20 mg/dL (ref 6–20)
CO2: 30 mmol/L (ref 22–32)
Calcium: 9.1 mg/dL (ref 8.9–10.3)
Chloride: 95 mmol/L — ABNORMAL LOW (ref 101–111)
Creatinine, Ser: 1.09 mg/dL — ABNORMAL HIGH (ref 0.44–1.00)
GFR calc Af Amer: 58 mL/min — ABNORMAL LOW (ref >60)
GFR calc non Af Amer: 50 mL/min — ABNORMAL LOW (ref >60)
Glucose, Bld: 122 mg/dL — ABNORMAL HIGH (ref 65–99)
Potassium: 4.5 mmol/L (ref 3.5–5.1)
Sodium: 136 mmol/L (ref 135–145)
Total Bilirubin: 0.8 mg/dL (ref 0.3–1.2)
Total Protein: 6.2 g/dL — ABNORMAL LOW (ref 6.5–8.1)

## 2017-06-09 LAB — I-STAT VENOUS BLOOD GAS, ED
Acid-Base Excess: 4 mmol/L — ABNORMAL HIGH (ref 0.0–2.0)
Bicarbonate: 28.8 mmol/L — ABNORMAL HIGH (ref 20.0–28.0)
O2 Saturation: 21 %
TCO2: 30 mmol/L (ref 22–32)
pCO2, Ven: 43.1 mmHg — ABNORMAL LOW (ref 44.0–60.0)
pH, Ven: 7.434 — ABNORMAL HIGH (ref 7.250–7.430)
pO2, Ven: 15 mmHg — CL (ref 32.0–45.0)

## 2017-06-09 LAB — RAPID URINE DRUG SCREEN, HOSP PERFORMED
Amphetamines: NOT DETECTED
Barbiturates: NOT DETECTED
Benzodiazepines: NOT DETECTED
Cocaine: NOT DETECTED
Opiates: POSITIVE — AB
Tetrahydrocannabinol: NOT DETECTED

## 2017-06-09 LAB — I-STAT CG4 LACTIC ACID, ED
Lactic Acid, Venous: 2.73 mmol/L (ref 0.5–1.9)
Lactic Acid, Venous: 2.61 mmol/L (ref 0.5–1.9)
Lactic Acid, Venous: 1.3 mmol/L (ref 0.5–1.9)

## 2017-06-09 LAB — ETHANOL: Alcohol, Ethyl (B): <10 mg/dL (ref <10)

## 2017-06-09 LAB — PROTIME-INR
INR: 0.98
Prothrombin Time: 12.9 seconds (ref 11.4–15.2)

## 2017-06-09 LAB — I-STAT TROPONIN, ED: Troponin i, poc: 0 ng/mL (ref 0.00–0.08)

## 2017-06-09 MED ORDER — DULOXETINE HCL 30 MG PO CPEP
30.0000 mg | ORAL_CAPSULE | Freq: Two times a day (BID) | ORAL | Status: DC
  Administered 2017-06-09 – 2017-06-10 (×3): 30 mg via ORAL
  Filled 2017-06-09 (×3): qty 1

## 2017-06-09 MED ORDER — SODIUM CHLORIDE 0.9 % IV BOLUS
1000.0000 mL | Freq: Once | INTRAVENOUS | Status: AC
  Administered 2017-06-09: 1000 mL via INTRAVENOUS

## 2017-06-09 MED ORDER — LORAZEPAM 2 MG/ML IJ SOLN
2.0000 mg | Freq: Once | INTRAMUSCULAR | Status: AC
  Administered 2017-06-09: 2 mg via INTRAVENOUS
  Filled 2017-06-09: qty 1

## 2017-06-09 MED ORDER — METOPROLOL SUCCINATE ER 25 MG PO TB24
50.0000 mg | ORAL_TABLET | Freq: Every day | ORAL | Status: DC
  Administered 2017-06-09 – 2017-06-10 (×2): 50 mg via ORAL
  Filled 2017-06-09 (×2): qty 2

## 2017-06-09 MED ORDER — LEVOTHYROXINE SODIUM 75 MCG PO TABS
75.0000 ug | ORAL_TABLET | Freq: Every day | ORAL | Status: DC
  Administered 2017-06-10: 75 ug via ORAL
  Filled 2017-06-09: qty 1

## 2017-06-09 MED ORDER — LORAZEPAM 0.5 MG PO TABS
0.5000 mg | ORAL_TABLET | Freq: Once | ORAL | Status: AC
  Administered 2017-06-09: 0.5 mg via ORAL
  Filled 2017-06-09: qty 1

## 2017-06-09 MED ORDER — SODIUM CHLORIDE 0.9 % IV BOLUS: 1000.0000 mL | Freq: Once | INTRAVENOUS | Status: DC

## 2017-06-09 MED ORDER — TRAZODONE HCL 100 MG PO TABS
300.0000 mg | ORAL_TABLET | Freq: Every day | ORAL | Status: DC
  Administered 2017-06-09: 300 mg via ORAL
  Filled 2017-06-09: qty 3

## 2017-06-09 MED ORDER — SODIUM CHLORIDE 0.9 % IV BOLUS
2000.0000 mL | Freq: Once | INTRAVENOUS | Status: AC
  Administered 2017-06-09: 2000 mL via INTRAVENOUS

## 2017-06-09 NOTE — ED Notes (Signed)
Lindsey Rowe asked repetitively to talk w/[name] on phone. RN dialed number for Lindsey Rowe - Lindsey Rowe became upset d/t VM came on phone rather than [facility] answering. RN advised Lindsey Rowe maybe she is on her way to come visit and encouraged Lindsey Rowe to calm.

## 2017-06-09 NOTE — ED Triage Notes (Signed)
Brought by ems from [facility] (on [address]).  Per neighbor patient was yelling and beating on her door.  Per neighbor patient is usually alert and oriented.  Wears Oxygen at home at 2L [place].  EMS also reports hallucinations and panic episodes.  Noted to be very confused in triage.

## 2017-06-09 NOTE — ED Notes (Signed)
Pt assisted back to bed. O2 continues to infuse via n/c. Pt spoke w/[name] on phone. States "Thank you. You won't hear me screaming so much now. I feel better."

## 2017-06-09 NOTE — ED Notes (Signed)
Tech 1st aware pt's friend advised she is coming to visit w/pt. Will advise of visitation policy when arrives. Pt lying on bed watching tv.

## 2017-06-09 NOTE — ED Notes (Signed)
Pt lying on bed watching tv. Pt asking repetitive questions - asking for phone book so may call a friend.

## 2017-06-09 NOTE — ED Notes (Signed)
Pt asking for staff to perform multiple tasks one at a time - requested for head of bed to be raised, to be covered w/blanket, then for head of bed to be lowered. Sitter showed pt how she can maneuver bed herself. Voiced understanding.

## 2017-06-09 NOTE — ED Notes (Signed)
Pt aware of delay w/lunch.  

## 2017-06-09 NOTE — ED Notes (Signed)
Pt arrived to [facility] via stretcher. O2 infusing at 3L/min via n/c. Pt assisted to bed from stretcher. Pt stood on floor w/o difficulty. Pt asking for staff to call a neighbor and request that she bring another friend to ED. States she doesn't have the number. Advised will assist w/calling when she can recall the number. Pt's belongings - 1 labeled belongings bag - placed at nurses' desk for inventory. Pt wearing hospital gown. Pt given snack as requested. Lunch order request received.

## 2017-06-09 NOTE — ED Provider Notes (Signed)
[facility], [place] EMERGENCY DEPARTMENT Provider Note   CSN: [id number] Arrival date & time: 06/09/17  0255     History   Chief Complaint Chief Complaint  Patient presents with  . Altered Mental Status    HPI Lindsey Rowe is a 72 y.o. female.  Chief complaint is confusion  HPI: Lindsey Rowe is 72.  She resides at [facility] independent living facility.  Currently the patient was confused and yelling and pounding on her neighbor's door.  She is on home O2 and has been compliant.  Confusion per paramedics.  Thought she was seeing some "black lines" in her apartment.  Became evident shortly after arrival that she was recently placed on penicillin, and Vicodin.  Per her report this was from urinary tract infection, or as she puts it "because of my pee hole".  She denies any dysuria now.  She has not had any other change in medications.  Past medical history bipolar disorder hypothyroid hypercholesterol migraine headaches.  Recent diagnosis of OSA on 2 L nasal cannula at night.  Past Medical History:  Diagnosis Date  . Arthritis   . Back pain   . Bipolar 1 disorder (HCC)   . Complication of anesthesia    hard to wake up anesthesia  . Depression   . GERD (gastroesophageal reflux disease)   . Headache(784.0)    migraines and tension headaches  . Hyperlipidemia   . Hypertension   . Hypothyroidism   . Thyroid disease     Patient Active Problem List   Diagnosis Date Noted  . Dyspnea and respiratory abnormalities 08/29/2016  . Hypoxemia 08/29/2016  . Shoulder dislocation, right, initial encounter 05/24/2016  . 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 Hyperglycemia 05/24/2016  . Anterior dislocation of right shoulder   . Syncope 03/24/2016  . Generalized weakness 09/24/2015  . Leg pain, bilateral 09/24/2015  . Depression 09/24/2015  . Loss of appetite 09/24/2015  . Leg weakness, bilateral   . Hypothyroid 12/28/2012  . Hypovolemia 12/28/2012  . Hypotension  08/13/2012  . Dehydration 08/13/2012  . AKI (acute kidney injury) (HCC) 08/13/2012  . Hypoventilation associated with obesity syndrome (HCC) 08/13/2012  . Acute respiratory failure (HCC) 07/26/2012  . Chest pain 07/26/2012  . ANKLE SPRAIN, RIGHT 03/16/2010  . DIVERTICULOSIS, COLON 10/31/2009  . SKIN LESION 09/23/2009  . Shoulder pain, right 07/05/2009  . COUGH 03/29/2009  . Migraine 03/11/2009  . OTITIS MEDIA, RIGHT 12/13/2008  . BRUISE 12/06/2008  . ACID REFLUX DISEASE 09/13/2008  . ALLERGIC RHINITIS 05/20/2008  . UNSPECIFIED DISEASE OF HAIR AND HAIR FOLLICLES 05/20/2008  . DERMATITIS 11/12/2007  . OBESITY 07/18/2007  . HYPERCHOLESTEROLEMIA 05/13/2007  . BIPOLAR AFFECTIVE DISORDER 05/12/2007  . HYPERTENSION, BENIGN ESSENTIAL 05/12/2007  . Low back pain potentially associated with radiculopathy 05/12/2007    Past Surgical History:  Procedure Laterality Date  . APPENDECTOMY    . BACK SURGERY    . EYE SURGERY Bilateral    lens implant  . GASTRIC BYPASS    . LOOP RECORDER INSERTION N/A 05/25/2016   Procedure: Loop Recorder Insertion;  Surgeon: Will Jorja Loa, MD;  Location: [facility] INVASIVE CV LAB;  Service: Cardiovascular;  Laterality: N/A;  . LUMBAR LAMINECTOMY/DECOMPRESSION MICRODISCECTOMY Right 07/23/2012   Procedure: LUMBAR LAMINECTOMY/DECOMPRESSION MICRODISCECTOMY 2 LEVELS;  Surgeon: Karn Cassis, MD;  Location: [facility] NEURO ORS;  Service: Neurosurgery;  Laterality: Right;  Right Lumbar three-four  lumbar four-five Laminectomy/Foraminotomy  . NASAL SINUS SURGERY Bilateral   . TONSILLECTOMY    .  WRIST SURGERY       OB History   None      Home Medications    Prior to Admission medications   Medication Sig Start Date End Date Taking? Authorizing Provider  Aspirin-Salicylamide-Caffeine (BC HEADACHE POWDER PO) Take 1 packet by mouth as needed (headache, pain).    [provider]  Cholecalciferol (D3-1000) 1000 units capsule Take 1,000 Units by mouth daily.     [provider]  DULoxetine (CYMBALTA) 30 MG capsule Take 1 capsule (30 mg total) by mouth 2 (two) times daily. 05/26/16   Pearson Grippe, MD  DULoxetine (CYMBALTA) 60 MG capsule Take 60 mg by mouth at bedtime.    [provider]  levothyroxine (SYNTHROID, LEVOTHROID) 75 MCG tablet Take 75 mcg by mouth daily before breakfast.  09/06/15   [provider]  lisinopril (PRINIVIL,ZESTRIL) 10 MG tablet  08/15/16   [provider]  meloxicam (MOBIC) 15 MG tablet Take 15 mg by mouth daily.  08/23/15   [provider]  metoprolol succinate (TOPROL-XL) 50 MG 24 hr tablet Take 1 tablet (50 mg total) by mouth daily. Take with or immediately following a meal. 05/20/17 08/18/17  Camnitz, Andree Coss, MD  omeprazole (PRILOSEC) 20 MG capsule Take 20 mg by mouth 2 (two) times daily before a meal.    [provider]  pravastatin (PRAVACHOL) 20 MG tablet Take 20 mg by mouth at bedtime.  09/06/15   [provider]  pregabalin (LYRICA) 100 MG capsule Take 1 capsule (100 mg total) by mouth 2 (two) times daily. 09/27/15   Joseph Art, DO  SUMAtriptan (IMITREX) 100 MG tablet Take 100 mg by mouth as needed for headache. Take  at onset of headache, may repeat in 2 hours if needed. Do not exceed 2 doses in 24 hours. 04/03/16   [provider]  traZODone (DESYREL) 100 MG tablet Take 300 mg by mouth at bedtime.     [provider]  vitamin B-12 (CYANOCOBALAMIN) 1000 MCG tablet Take 1,000 mcg by mouth daily.    [provider]    Family History Family History  Problem Relation Age of Onset  . Heart disease Mother   . Emphysema Father   . Diabetes Sister   . Other Brother        Brain tumor - s/p excision    Social History Social History   Tobacco Use  . Smoking status: Never Smoker  . Smokeless tobacco: Never Used  Substance Use Topics  . Alcohol use: Yes    Comment: social  . Drug use: No     Allergies   Gabapentin and  Cefadroxil   Review of Systems Review of Systems  Constitutional: Negative for appetite change, chills, diaphoresis, fatigue and fever.  HENT: Negative for mouth sores, sore throat and trouble swallowing.   Eyes: Negative for visual disturbance.  Respiratory: Negative for cough, chest tightness, shortness of breath and wheezing.   Cardiovascular: Negative for chest pain.  Gastrointestinal: Negative for abdominal distention, abdominal pain, diarrhea, nausea and vomiting.  Endocrine: Negative for polydipsia, polyphagia and polyuria.  Genitourinary: Negative for dysuria, frequency and hematuria.  Musculoskeletal: Negative for gait problem.  Skin: Negative for color change, pallor and rash.  Neurological: Negative for dizziness, syncope, light-headedness and headaches.  Hematological: Does not bruise/bleed easily.  Psychiatric/Behavioral: Positive for agitation and confusion. Negative for behavioral problems.     Physical Exam Updated Vital Signs BP (!) 140/96   Pulse 78   Temp 99  F (37.2 C) (Rectal)   Resp (!) 26   Ht  (1.6 m)   Wt 124.6 kg (274 lb 11.1 oz)   SpO2 100%   BMI 48.66 kg/m   Physical Exam  Constitutional: No distress.  Obese  HENT:  Head: Normocephalic.  Eyes: Pupils are equal, round, and reactive to light. Conjunctivae are normal. No scleral icterus.  Neck: Normal range of motion. Neck supple. No thyromegaly present.  Cardiovascular: Normal rate and regular rhythm. Exam reveals no gallop and no friction rub.  No murmur heard. Pulmonary/Chest: Effort normal and breath sounds normal. No respiratory distress. She has no wheezes. She has no rales.  Abdominal: Soft. Bowel sounds are normal. She exhibits no distension. There is no tenderness. There is no rebound.  Musculoskeletal: Normal range of motion.  Neurological: She is alert.  She is oriented x3.  Skin: Skin is warm and dry. No rash noted.  Psychiatric:  Occasional screaming outbursts.  Occasionally  agitated.  Alternating with periods of calm.  Describes seeing the black lines.  States she is not seeing them as much now.     ED Treatments / Results  Labs (all labs ordered are listed, but only abnormal results are displayed) Labs Reviewed  COMPREHENSIVE METABOLIC PANEL - Abnormal; Notable for the following components:      Result Value   Chloride 95 (*)    Glucose, Bld 122 (*)    Creatinine, Ser 1.09 (*)    Total Protein 6.2 (*)    GFR calc non Af Amer 50 (*)    GFR calc Af Amer 58 (*)    All other components within normal limits  URINALYSIS, ROUTINE W REFLEX MICROSCOPIC - Abnormal; Notable for the following components:   Ketones, ur 5 (*)    All other components within normal limits  RAPID URINE DRUG SCREEN, HOSP PERFORMED - Abnormal; Notable for the following components:   Opiates POSITIVE (*)    All other components within normal limits  I-STAT CG4 LACTIC ACID, ED - Abnormal; Notable for the following components:   Lactic Acid, Venous 2.73 (*)    All other components within normal limits  I-STAT CG4 LACTIC ACID, ED - Abnormal; Notable for the following components:   Lactic Acid, Venous 2.61 (*)    All other components within normal limits  I-STAT VENOUS BLOOD GAS, ED - Abnormal; Notable for the following components:   pH, Ven 7.434 (*)    pCO2, Ven 43.1 (*)    pO2, Ven 15.0 (*)    Bicarbonate 28.8 (*)    Acid-Base Excess 4.0 (*)    All other components within normal limits  CBC WITH DIFFERENTIAL/PLATELET  PROTIME-INR  ETHANOL  BLOOD GAS, VENOUS  I-STAT TROPONIN, ED    EKG None  Radiology Dg Chest 2 View  Result Date: 06/09/2017 CLINICAL DATA:  72 year old female with panic episodes and hallucination. EXAM: CHEST - 2 VIEW COMPARISON:  Chest CT dated 05/01/2017 FINDINGS: The lungs are clear. There is no pleural effusion or pneumothorax. Top-normal cardiac silhouette. A loop recorder device is noted. No acute osseous pathology. IMPRESSION: No active cardiopulmonary  disease. Electronically Signed   By: Elgie Collard M.D.   On: 06/09/2017 03:59   Ct Head Wo Contrast  Result Date: 06/09/2017 CLINICAL DATA:  72 year old female with altered mental status. EXAM: CT HEAD WITHOUT CONTRAST TECHNIQUE: Contiguous axial images were obtained from the base of the skull through the vertex without intravenous contrast. COMPARISON:  Head CT dated 03/23/2016 FINDINGS:  Evaluation of this exam is limited due to motion artifact. Brain: There is mild age-related atrophy and chronic microvascular ischemic changes. No acute intracranial hemorrhage. No mass effect or midline shift. No extra-axial fluid collection. Vascular: No hyperdense vessel or unexpected calcification. Skull: Normal. Negative for fracture or focal lesion. Sinuses/Orbits: No acute finding. Other: None IMPRESSION: 1. No acute intracranial hemorrhage. 2. Mild age-related atrophy and chronic microvascular ischemic changes. Electronically Signed   By: Elgie Collard M.D.   On: 06/09/2017 04:11    Procedures Procedures (including critical care time)  Medications Ordered in ED Medications  sodium chloride 0.9 % bolus 1,000 mL (1,000 mLs Intravenous New Bag/Given 06/09/17 0542)  sodium chloride 0.9 % bolus 1,000 mL (has no administration in time range)  LORazepam (ATIVAN) injection 2 mg (has no administration in time range)  sodium chloride 0.9 % bolus 2,000 mL (2,000 mLs Intravenous New Bag/Given 06/09/17 0448)     Initial Impression / Assessment and Plan / ED Course  I have reviewed the triage vital signs and the nursing notes.  Pertinent labs & imaging results that were available during my care of the patient were reviewed by me and considered in my medical decision making (see chart for details).    Early patient newly on narcotic with some visual hallucinations and confusion.  Evaluation for a organic etiology negative including CT scan, urine, labs, sodium etc.  Likely culprit is her Vicodin.  Remains  agitated.  I discussed with her that her symptoms may be secondary to the Vicodin.  She literally lashes onto this mentally and at times is repeating "they tried to kill me over there".    She is at independent living not skilled nursing.  Will ask behavioral health for evaluation.  Initially paramedics reported possible fever.  Her core temperature with rectal temperature is negative.  Lactate was obtained and was slightly elevated at 2.7.  Were attempting to clear this with IV fluids.  She is not febrile.  She does not have leukocytosis.  She does not have sign of infection.  She is not on medications that would induce acidosis.  If acidosis clears would plan psychiatric clearance/evaluation for further recommendations.  Final Clinical Impressions(s) / ED Diagnoses   Final diagnoses:  Medication side effects  Agitation  Visual hallucinations    ED Discharge Orders    None       Rolland Porter, MD 06/09/17 [phone]

## 2017-06-09 NOTE — ED Notes (Signed)
Tamika, NP, [facility], aware of pt's display of behavior. Recommended to obtain med list from [facility] if possible and may administer Ativan 0.5mg  po. Advised none of pt's meds listed are for Bi-polar. RN called [facility] [employer] [facility] [phone]. Message stated the office is only open on Tu & Th & every other Fri 8-5pm. Will attempt to reach pt's friends.

## 2017-06-09 NOTE — ED Notes (Signed)
Taken to CT at this time. 
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
## 2017-06-09 NOTE — ED Notes (Signed)
Pt states she is unable to eat lunch. State she is not hungry. Pt repeatedly states she is going to get [name] to take her home when she comes to visit. Pt then states she wants to speak w/[name] because she has not talked to her. Advised pt she did talk w/her on the phone. Pt states "Can I make a deal w/you and get [name] to bring my oxygen tank and walker and take me home?" Advised pt she will be staying the night in the hospital and will be reassessed tomorrow. Pt then asking "Does this have a redial on it?" as she is holding the remote to the tv.

## 2017-06-10 DIAGNOSIS — F419 Anxiety disorder, unspecified: Secondary | ICD-10-CM

## 2017-06-10 DIAGNOSIS — R4182 Altered mental status, unspecified: Secondary | ICD-10-CM | POA: Diagnosis present

## 2017-06-10 DIAGNOSIS — T887XXA Unspecified adverse effect of drug or medicament, initial encounter: Secondary | ICD-10-CM | POA: Diagnosis not present

## 2017-06-10 DIAGNOSIS — G4700 Insomnia, unspecified: Secondary | ICD-10-CM | POA: Diagnosis not present

## 2017-06-10 DIAGNOSIS — R450 Nervousness: Secondary | ICD-10-CM

## 2017-06-10 DIAGNOSIS — Z560 Unemployment, unspecified: Secondary | ICD-10-CM

## 2017-06-10 DIAGNOSIS — M4800 Spinal stenosis, site unspecified: Secondary | ICD-10-CM | POA: Diagnosis not present

## 2017-06-10 NOTE — ED Provider Notes (Signed)
Patient has been seen and cleared by ED providers, seen and evaluated by TTS, who feel like she is low risk for discharge and stable.  Findings discussed with the patient and all questions were answered   Mancel Bale, MD 06/10/17 1103

## 2017-06-10 NOTE — B[facility] Suicide Risk Assessment (Signed)
Suicide Risk Assessment  Discharge Assessment   [facility] Discharge Suicide Risk Assessment   Principal Problem: Altered mental state Discharge Diagnoses:  Patient Active Problem List   Diagnosis Date Noted  . Altered mental state [R41.82] 06/10/2017    Priority: High  . Dyspnea and respiratory abnormalities [R06.00, R06.89] 08/29/2016  . Hypoxemia [R09.02] 08/29/2016  . Shoulder dislocation, right, initial encounter [S43.004A] 05/24/2016  . 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 Hyperglycemia [R73.9] 05/24/2016  . Anterior dislocation of right shoulder [S43.014A]   . Syncope [R55] 03/24/2016  . Generalized weakness [R53.1] 09/24/2015  . Leg pain, bilateral [Z61.096, M79.605] 09/24/2015  . Depression [F32.9] 09/24/2015  . Loss of appetite [R63.0] 09/24/2015  . Leg weakness, bilateral [R29.898]   . Hypothyroid [E03.9] 12/28/2012  . Hypovolemia [E86.1] 12/28/2012  . Hypotension [I95.9] 08/13/2012  . Dehydration [E86.0] 08/13/2012  . AKI (acute kidney injury) (HCC) [N17.9] 08/13/2012  . Hypoventilation associated with obesity syndrome (HCC) [E66.2] 08/13/2012  . Acute respiratory failure (HCC) [J96.00] 07/26/2012  . Chest pain [R07.9] 07/26/2012  . ANKLE SPRAIN, RIGHT [S93.409A] 03/16/2010  . DIVERTICULOSIS, COLON [K57.30] 10/31/2009  . SKIN LESION [L98.9] 09/23/2009  . Shoulder pain, right [M25.511] 07/05/2009  . COUGH [R05] 03/29/2009  . Migraine [G43.909] 03/11/2009  . OTITIS MEDIA, RIGHT [H66.90] 12/13/2008  . BRUISE [T14.8XXA] 12/06/2008  . ACID REFLUX DISEASE [K21.9] 09/13/2008  . ALLERGIC RHINITIS [J30.9] 05/20/2008  . UNSPECIFIED DISEASE OF HAIR AND HAIR FOLLICLES [L67.9, L73.9] 05/20/2008  . DERMATITIS [L25.9] 11/12/2007  . OBESITY [E66.9] 07/18/2007  . HYPERCHOLESTEROLEMIA [E78.00] 05/13/2007  . BIPOLAR AFFECTIVE DISORDER [F31.9] 05/12/2007  . HYPERTENSION, BENIGN ESSENTIAL [I10] 05/12/2007  . Low back pain potentially associated with radiculopathy [M54.5]  05/12/2007    Total Time spent with patient: 45 minutes  Musculoskeletal: Strength & Muscle Tone: within normal limits Gait & Station: normal Patient leans: N/A  Psychiatric Specialty Exam: Physical Exam  Constitutional: She is oriented to person, place, and time. She appears well-developed and well-nourished.  HENT:  Head: Normocephalic.  Neck: Normal range of motion.  Respiratory: Effort normal.  Musculoskeletal: Normal range of motion.  Neurological: She is alert and oriented to person, place, and time.  Psychiatric: Her speech is normal and behavior is normal. Judgment and thought content normal. Her mood appears anxious. Cognition and memory are normal.    Review of Systems  Psychiatric/Behavioral: The patient is nervous/anxious.   All other systems reviewed and are negative.   Blood pressure 138/75, pulse 85, temperature 98.3 F (36.8 C), temperature source Oral, resp. rate 16, height  (1.6 m), weight 124.6 kg (274 lb 11.1 oz), SpO2 98 %.Body mass index is 48.66 kg/m.  General Appearance: Casual  Eye Contact:  Good  Speech:  Normal Rate  Volume:  Normal  Mood:  Anxious, mild  Affect:  Congruent  Thought Process:  Coherent and Descriptions of Associations: Intact  Orientation:  Full (Time, Place, and Person)  Thought Content:  WDL and Logical  Suicidal Thoughts:  No  Homicidal Thoughts:  No  Memory:  Immediate;   Good Recent;   Good Remote;   Good  Judgement:  Fair  Insight:  Good  Psychomotor Activity:  Normal  Concentration:  Concentration: Good and Attention Span: Good  Recall:  Good  Fund of Knowledge:  Good  Language:  Good  Akathisia:  No  Handed:  Right  AIMS (if indicated):     Assets:  Housing Leisure Time Resilience Social Support  ADL's:  Intact  Cognition:  WNL  Sleep:      Mental Status Per Nursing Assessment::   On Admission:   confusion, AMS  Demographic Factors:  Age 51 or older and Caucasian  Loss Factors: Decline in  physical health  Historical Factors: NA  Risk Reduction Factors:   Sense of responsibility to family, Positive social support, Positive therapeutic relationship and Positive coping skills or problem solving skills  Continued Clinical Symptoms:  Anxiety, mild  Cognitive Features That Contribute To Risk:  None    Suicide Risk:  Minimal: No identifiable suicidal ideation.  Patients presenting with no risk factors but with morbid ruminations; may be classified as minimal risk based on the severity of the depressive symptoms    Plan Of Care/Follow-up recommendations:  Activity:  as tolerated Diet:  heart healthy diet  Mariel Lukins, NP 06/10/2017, 10:36 AM

## 2017-06-10 NOTE — ED Triage Notes (Signed)
PT sitting up eating breakfast smiling and talking with Sitter.

## 2017-06-10 NOTE — ED Triage Notes (Signed)
PT sitter from Home that [facility] [employer] in room to see PT. PT DC Home today. Sitter went to PT's home to pick up O2 tank . Pt is O2 dependent and needs O2 to go home.

## 2017-06-10 NOTE — Discharge Instructions (Addendum)
Take all of your medications as usual.  Make sure you are getting plenty of rest and eating and drinking regularly.  Return here, if needed, for problems.

## 2017-06-10 NOTE — ED Triage Notes (Signed)
PT seen by Nanine Means ,NP and DC home . Pt is eating lujnch while waiting for Home sitter to return with Home O2.

## 2017-06-10 NOTE — Consult Note (Addendum)
[facility] Consult   Reason for Consult:  Confusion  Referring Physician:  EDP Patient Identification: Lindsey Rowe MRN:  [id number] Principal Diagnosis: Altered mental state Diagnosis:   Patient Active Problem List   Diagnosis Date Noted  . Altered mental state [R41.82] 06/10/2017    Priority: High  . Dyspnea and respiratory abnormalities [R06.00, R06.89] 08/29/2016  . Hypoxemia [R09.02] 08/29/2016  . Shoulder dislocation, right, initial encounter [S43.004A] 05/24/2016  . 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 Hyperglycemia [R73.9] 05/24/2016  . Anterior dislocation of right shoulder [S43.014A]   . Syncope [R55] 03/24/2016  . Generalized weakness [R53.1] 09/24/2015  . Leg pain, bilateral [U72.536, M79.605] 09/24/2015  . Depression [F32.9] 09/24/2015  . Loss of appetite [R63.0] 09/24/2015  . Leg weakness, bilateral [R29.898]   . Hypothyroid [E03.9] 12/28/2012  . Hypovolemia [E86.1] 12/28/2012  . Hypotension [I95.9] 08/13/2012  . Dehydration [E86.0] 08/13/2012  . AKI (acute kidney injury) ([place]) [N17.9] 08/13/2012  . Hypoventilation associated with obesity syndrome ([place]) [E66.2] 08/13/2012  . Acute respiratory failure ([place]) [J96.00] 07/26/2012  . Chest pain [R07.9] 07/26/2012  . ANKLE SPRAIN, RIGHT [S93.409A] 03/16/2010  . DIVERTICULOSIS, COLON [K57.30] 10/31/2009  . SKIN LESION [L98.9] 09/23/2009  . Shoulder pain, right [M25.511] 07/05/2009  . COUGH [R05] 03/29/2009  . Migraine [G43.909] 03/11/2009  . OTITIS MEDIA, RIGHT [H66.90] 12/13/2008  . BRUISE [T14.8XXA] 12/06/2008  . ACID REFLUX DISEASE [K21.9] 09/13/2008  . ALLERGIC RHINITIS [J30.9] 05/20/2008  . UNSPECIFIED DISEASE OF HAIR AND HAIR FOLLICLES [U44.0, H47.4] 05/20/2008  . DERMATITIS [L25.9] 11/12/2007  . OBESITY [E66.9] 07/18/2007  . HYPERCHOLESTEROLEMIA [E78.00] 05/13/2007  . BIPOLAR AFFECTIVE DISORDER [F31.9] 05/12/2007  . HYPERTENSION, BENIGN ESSENTIAL [I10] 05/12/2007  . Low  back pain potentially associated with radiculopathy [M54.5] 05/12/2007    Total Time spent with patient: 45 minutes  Subjective:   Lindsey Rowe is a 72 y.o. female patient does not warrant psychiatric admission, return to her facility.  Re-evaluation of new opiate prescription (Vicodin prior to admission that was recently started) with her dyspnea from her prescribing provider prior to admission.  Dr. Dwyane Dee, psychiatrist, has reviewed this patient and concurs with the disposition.  HPI:  72 yo female who presented to the ED after being confused with yelling and banging on her neighbor's door at her independent living facility.  Evidently, she thought she saw black lines in her apartment.  Today on assessment, she is clear and coherent.  Denies suicidal/homicidal ideations, hallucinations, and substance abuse.  She is alert and oriented but baffled at her confusion yesterday.  After discussing her situation, she is on oxygen due to dyspnea.  She recently diagnosed with an infection and started on antibiotic along with Vicodin.  The combination of a new opiate with an infection along with oxygenation issues could have contributed to her confusion yesterday.  She also was upset that her neighbor was using all of the "air conditioning", evidently the systems are connected, and the heat was causing her to have an increase in dyspnea.  Questionable past history of bipolar disorder as she is only on antidepressants with mood stabilization.  No medication changes warranted, stable for discharge.  Past Psychiatric History: depression  Risk to Self: Suicidal Ideation: No Suicidal Intent: No Is patient at risk for suicide?: No Suicidal Plan?: No Access to Means: No What has been your use of drugs/alcohol within the last 12 months?: None Intentional Self Injurious Behavior: None Risk to Others: Homicidal Ideation: No Thoughts of Harm to Others:  No Current Homicidal Intent: No Current Homicidal Plan:  No Access to Homicidal Means: No History of harm to others?: No Assessment of Violence: None Noted Does patient have access to weapons?: No Criminal Charges Pending?: No Does patient have a court date: No Prior Inpatient Therapy: Prior Inpatient Therapy: No Prior Outpatient Therapy: Prior Outpatient Therapy: Yes Prior Therapy Dates: Ongoing Prior Therapy Facilty/Provider(s): Cannot recall Reason for Treatment: Bipolar Does patient have an ACCT team?: No Does patient have Intensive In-House Services?  : No Does patient have [facility] services? : No Does patient have P4CC services?: No  Past Medical History:  Past Medical History:  Diagnosis Date  . Arthritis   . Back pain   . Bipolar 1 disorder ([place])   . Complication of anesthesia    hard to wake up anesthesia  . Depression   . GERD (gastroesophageal reflux disease)   . Headache(784.0)    migraines and tension headaches  . Hyperlipidemia   . Hypertension   . Hypothyroidism   . Thyroid disease     Past Surgical History:  Procedure Laterality Date  . APPENDECTOMY    . BACK SURGERY    . EYE SURGERY Bilateral    lens implant  . GASTRIC BYPASS    . LOOP RECORDER INSERTION N/A 05/25/2016   Procedure: Loop Recorder Insertion;  Surgeon: Will Meredith Leeds, MD;  Location: [facility];  Service: Cardiovascular;  Laterality: N/A;  . LUMBAR LAMINECTOMY/DECOMPRESSION MICRODISCECTOMY Right 07/23/2012   Procedure: LUMBAR LAMINECTOMY/DECOMPRESSION MICRODISCECTOMY 2 LEVELS;  Surgeon: Floyce Stakes, MD;  Location: [facility];  Service: Neurosurgery;  Laterality: Right;  Right Lumbar three-four  lumbar four-five Laminectomy/Foraminotomy  . NASAL SINUS SURGERY Bilateral   . TONSILLECTOMY    . WRIST SURGERY     Family History:  Family History  Problem Relation Age of Onset  . Heart disease Mother   . Emphysema Father   . Diabetes Sister   . Other Brother        Brain tumor - s/p excision   Family Psychiatric  History:  none Social History:  Social History   Substance and Sexual Activity  Alcohol Use Yes   Comment: social     Social History   Substance and Sexual Activity  Drug Use No   Comment: Pt + opioids as prescribed    Social History   Socioeconomic History  . Marital status: Widowed    Spouse name: Not on file  . Number of children: Not on file  . Years of education: Not on file  . Highest education level: Not on file  Occupational History  . Occupation: unemployed  Social Needs  . Financial resource strain: Not on file  . Food insecurity:    Worry: Not on file    Inability: Not on file  . Transportation needs:    Medical: Not on file    Non-medical: Not on file  Tobacco Use  . Smoking status: Never Smoker  . Smokeless tobacco: Never Used  Substance and Sexual Activity  . Alcohol use: Yes    Comment: social  . Drug use: No    Comment: Pt + opioids as prescribed  . Sexual activity: Not Currently  Lifestyle  . Physical activity:    Days per week: Not on file    Minutes per session: Not on file  . Stress: Not on file  Relationships  . Social connections:    Talks on phone: Not on file    Gets together:  Not on file    Attends religious service: Not on file    Active member of club or organization: Not on file    Attends meetings of clubs or organizations: Not on file    Relationship status: Not on file  Other Topics Concern  . Not on file  Social History Narrative   Pt lives alone in apartment in [place].   Additional Social History:    Allergies:   Allergies  Allergen Reactions  . Gabapentin Other (See Comments)    Right foot and leg swelled   . Cefadroxil Itching    Ends of hair itched     Labs:  Results for orders placed or performed during the hospital encounter of 06/09/17 (from the past 48 hour(s))  Ethanol     Status: None   Collection Time: 06/09/17  3:00 AM  Result Value Ref Range   Alcohol, Ethyl (B) <10 <10 mg/dL    Comment:        LOWEST  DETECTABLE LIMIT FOR SERUM ALCOHOL IS 10 mg/dL FOR MEDICAL PURPOSES ONLY Performed at [facility], [place] [address] [place], [place] [address]   Urinalysis, Routine w reflex microscopic     Status: Abnormal   Collection Time: 06/09/17  3:07 AM  Result Value Ref Range   Color, Urine YELLOW YELLOW   APPearance CLEAR CLEAR   Specific Gravity, Urine 1.018 1.005 - 1.030   pH 5.0 5.0 - 8.0   Glucose, UA NEGATIVE NEGATIVE mg/dL   Hgb urine dipstick NEGATIVE NEGATIVE   Bilirubin Urine NEGATIVE NEGATIVE   Ketones, ur 5 (A) NEGATIVE mg/dL   Protein, ur NEGATIVE NEGATIVE mg/dL   Nitrite NEGATIVE NEGATIVE   Leukocytes, UA NEGATIVE NEGATIVE    Comment: Performed at [facility] [address] [place], [place] [address]  Comprehensive metabolic panel     Status: Abnormal   Collection Time: 06/09/17  3:11 AM  Result Value Ref Range   Sodium 136 135 - 145 mmol/L   Potassium 4.5 3.5 - 5.1 mmol/L   Chloride 95 (L) 101 - 111 mmol/L   CO2 30 22 - 32 mmol/L   Glucose, Bld 122 (H) 65 - 99 mg/dL   BUN 20 6 - 20 mg/dL   Creatinine, Ser 1.09 (H) 0.44 - 1.00 mg/dL   Calcium 9.1 8.9 - 10.3 mg/dL   Total Protein 6.2 (L) 6.5 - 8.1 g/dL   Albumin 3.7 3.5 - 5.0 g/dL   AST 22 15 - 41 U/L   ALT 14 14 - 54 U/L   Alkaline Phosphatase 63 38 - 126 U/L   Total Bilirubin 0.8 0.3 - 1.2 mg/dL   GFR calc non Af Amer 50 (L) >60 mL/min   GFR calc Af Amer 58 (L) >60 mL/min    Comment: (NOTE) The eGFR has been calculated using the CKD EPI equation. This calculation has not been validated in all clinical situations. eGFR's persistently <60 mL/min signify possible Chronic Kidney Disease.    Anion gap 11 5 - 15    Comment: Performed at [facility] [address], [place], [place] [address]  CBC with Differential     Status: None   Collection Time: 06/09/17  3:11 AM  Result Value Ref Range   WBC 9.1 4.0 - 10.5 K/uL   RBC 4.04 3.87 - 5.11 MIL/uL   Hemoglobin 12.2 12.0 - 15.0 g/dL   HCT 39.5  36.0 - 46.0 %   MCV 97.8 78.0 -  100.0 fL   MCH 30.2 26.0 - 34.0 pg   MCHC 30.9 30.0 - 36.0 g/dL   RDW 13.9 11.5 - 15.5 %   Platelets 215 150 - 400 K/uL   Neutrophils Relative % 68 %   Neutro Abs 6.3 1.7 - 7.7 K/uL   Lymphocytes Relative 24 %   Lymphs Abs 2.2 0.7 - 4.0 K/uL   Monocytes Relative 7 %   Monocytes Absolute 0.6 0.1 - 1.0 K/uL   Eosinophils Relative 1 %   Eosinophils Absolute 0.1 0.0 - 0.7 K/uL   Basophils Relative 0 %   Basophils Absolute 0.0 0.0 - 0.1 K/uL    Comment: Performed at [facility] [address] [place], [place] [address]  Protime-INR     Status: None   Collection Time: 06/09/17  3:11 AM  Result Value Ref Range   Prothrombin Time 12.9 11.4 - 15.2 seconds   INR 0.98     Comment: Performed at [facility] [address] [place], [place] [address]  I-Stat CG4 Lactic Acid, ED     Status: Abnormal   Collection Time: 06/09/17  3:18 AM  Result Value Ref Range   Lactic Acid, Venous 2.73 (HH) 0.5 - 1.9 mmol/L   Comment NOTIFIED PHYSICIAN   Rapid urine drug screen (hospital performed)     Status: Abnormal   Collection Time: 06/09/17  3:26 AM  Result Value Ref Range   Opiates POSITIVE (A) NONE DETECTED   Cocaine NONE DETECTED NONE DETECTED   Benzodiazepines NONE DETECTED NONE DETECTED   Amphetamines NONE DETECTED NONE DETECTED   Tetrahydrocannabinol NONE DETECTED NONE DETECTED   Barbiturates NONE DETECTED NONE DETECTED    Comment: (NOTE) DRUG SCREEN FOR MEDICAL PURPOSES ONLY.  IF CONFIRMATION IS NEEDED FOR ANY PURPOSE, NOTIFY LAB WITHIN 5 DAYS. LOWEST DETECTABLE LIMITS FOR URINE DRUG SCREEN Drug Class                     Cutoff (ng/mL) Amphetamine and metabolites    1000 Barbiturate and metabolites    200 Benzodiazepine                 800 Tricyclics and metabolites     300 Opiates and metabolites        300 Cocaine and metabolites        300 THC                            50 Performed at [facility], [place] [address],  [place], [place] [address]   I-stat troponin, ED     Status: None   Collection Time: 06/09/17  3:41 AM  Result Value Ref Range   Troponin i, poc 0.00 0.00 - 0.08 ng/mL   Comment 3            Comment: Due to the release kinetics of cTnI, a negative result within the first hours of the onset of symptoms does not rule out myocardial infarction with certainty. If myocardial infarction is still suspected, repeat the test at appropriate intervals.   I-Stat CG4 Lactic Acid, ED     Status: Abnormal   Collection Time: 06/09/17  5:31 AM  Result Value Ref Range   Lactic Acid, Venous 2.61 (HH) 0.5 - 1.9 mmol/L   Comment NOTIFIED PHYSICIAN   I-Stat venous blood gas, ED     Status: Abnormal   Collection Time: 06/09/17  5:31  AM  Result Value Ref Range   pH, Ven 7.434 (H) 7.250 - 7.430   pCO2, Ven 43.1 (L) 44.0 - 60.0 mmHg   pO2, Ven 15.0 (LL) 32.0 - 45.0 mmHg   Bicarbonate 28.8 (H) 20.0 - 28.0 mmol/L   TCO2 30 22 - 32 mmol/L   O2 Saturation 21.0 %   Acid-Base Excess 4.0 (H) 0.0 - 2.0 mmol/L   Patient temperature HIDE    Sample type VENOUS   I-Stat CG4 Lactic Acid, ED     Status: None   Collection Time: 06/09/17 10:08 AM  Result Value Ref Range   Lactic Acid, Venous 1.30 0.5 - 1.9 mmol/L    Current Facility-Administered Medications  Medication Dose Route Frequency Provider Last Rate Last Dose  . DULoxetine (CYMBALTA) DR capsule 30 mg  30 mg Oral BID Lajean Saver, MD   30 mg at 06/09/17 2236  . levothyroxine (SYNTHROID, LEVOTHROID) tablet 75 mcg  75 mcg Oral QAC breakfast Lajean Saver, MD      . metoprolol succinate (TOPROL-XL) 24 hr tablet 50 mg  50 mg Oral Daily Lajean Saver, MD   50 mg at 06/09/17 1315  . traZODone (DESYREL) tablet 300 mg  300 mg Oral QHS Lajean Saver, MD   300 mg at 06/09/17 2236   Current Outpatient Medications  Medication Sig Dispense Refill  . Aspirin-Salicylamide-Caffeine (BC HEADACHE POWDER PO) Take 1 packet by mouth as needed (headache, pain).    .  Cholecalciferol (D3-1000) 1000 units capsule Take 1,000 Units by mouth daily.    . DULoxetine (CYMBALTA) 30 MG capsule Take 1 capsule (30 mg total) by mouth 2 (two) times daily. 60 capsule 0  . levothyroxine (SYNTHROID, LEVOTHROID) 75 MCG tablet Take 75 mcg by mouth daily before breakfast.     . lisinopril (PRINIVIL,ZESTRIL) 10 MG tablet     . meloxicam (MOBIC) 15 MG tablet Take 15 mg by mouth daily.     . metoprolol succinate (TOPROL-XL) 50 MG 24 hr tablet Take 1 tablet (50 mg total) by mouth daily. Take with or immediately following a meal. 90 tablet 3  . omeprazole (PRILOSEC) 20 MG capsule Take 20 mg by mouth 2 (two) times daily before a meal.    . penicillin v potassium (VEETID) 500 MG tablet Take 500 mg by mouth 2 (two) times daily. For 10 days. Started 06-07-17.    . pravastatin (PRAVACHOL) 20 MG tablet Take 20 mg by mouth at bedtime.     . pregabalin (LYRICA) 100 MG capsule Take 1 capsule (100 mg total) by mouth 2 (two) times daily.    . traZODone (DESYREL) 100 MG tablet Take 300 mg by mouth at bedtime.     . SUMAtriptan (IMITREX) 100 MG tablet Take 100 mg by mouth as needed for headache. Take 146m at onset of headache, may repeat in 2 hours if needed. Do not exceed 2 doses in 24 hours.    . vitamin B-12 (CYANOCOBALAMIN) 1000 MCG tablet Take 1,000 mcg by mouth daily.      Musculoskeletal: Strength & Muscle Tone: within normal limits Gait & Station: normal Patient leans: N/A  Psychiatric Specialty Exam: Physical Exam  Constitutional: She is oriented to person, place, and time. She appears well-developed and well-nourished.  HENT:  Head: Normocephalic.  Neck: Normal range of motion.  Respiratory: Effort normal.  Musculoskeletal: Normal range of motion.  Neurological: She is alert and oriented to person, place, and time.  Psychiatric: Her speech is normal and behavior is normal. Judgment  and thought content normal. Her mood appears anxious. Cognition and memory are normal.    Review  of Systems  Psychiatric/Behavioral: The patient is nervous/anxious.   All other systems reviewed and are negative.   Blood pressure 138/75, pulse 85, temperature 98.3 F (36.8 C), temperature source Oral, resp. rate 16, height '5\' 3"'  (1.6 m), weight 124.6 kg (274 lb 11.1 oz), SpO2 98 %.Body mass index is 48.66 kg/m.  General Appearance: Casual  Eye Contact:  Good  Speech:  Normal Rate  Volume:  Normal  Mood:  Anxious, mild  Affect:  Congruent  Thought Process:  Coherent and Descriptions of Associations: Intact  Orientation:  Full (Time, Place, and Person)  Thought Content:  WDL and Logical  Suicidal Thoughts:  No  Homicidal Thoughts:  No  Memory:  Immediate;   Good Recent;   Good Remote;   Good  Judgement:  Fair  Insight:  Good  Psychomotor Activity:  Normal  Concentration:  Concentration: Good and Attention Span: Good  Recall:  Good  Fund of Knowledge:  Good  Language:  Good  Akathisia:  No  Handed:  Right  AIMS (if indicated):     Assets:  Housing Leisure Time Resilience Social Support  ADL's:  Intact  Cognition:  WNL  Sleep:        Treatment Plan Summary: Daily contact with patient to assess and evaluate symptoms and progress in treatment, Medication management and Plan altered mental status:  -Crisis stabilization -Medication management:  Continue current medical medication regiment along with her Cymbalta 30 mg BID for depression and Trazodone 300 mg at bedtime for sleep -Individual counseling -Coordination of care back to her faciity  Disposition: No evidence of imminent risk to self or others at present.    Waylan Boga, NP 06/10/2017 10:20 AM

## 2017-06-11 DIAGNOSIS — M4800 Spinal stenosis, site unspecified: Secondary | ICD-10-CM | POA: Diagnosis not present

## 2017-06-12 DIAGNOSIS — M4800 Spinal stenosis, site unspecified: Secondary | ICD-10-CM | POA: Diagnosis not present

## 2017-06-13 DIAGNOSIS — T887XXA Unspecified adverse effect of drug or medicament, initial encounter: Secondary | ICD-10-CM | POA: Diagnosis not present

## 2017-06-13 DIAGNOSIS — M810 Age-related osteoporosis without current pathological fracture: Secondary | ICD-10-CM | POA: Diagnosis not present

## 2017-06-13 DIAGNOSIS — M4800 Spinal stenosis, site unspecified: Secondary | ICD-10-CM | POA: Diagnosis not present

## 2017-06-14 DIAGNOSIS — M4800 Spinal stenosis, site unspecified: Secondary | ICD-10-CM | POA: Diagnosis not present

## 2017-06-15 DIAGNOSIS — M4800 Spinal stenosis, site unspecified: Secondary | ICD-10-CM | POA: Diagnosis not present

## 2017-06-16 DIAGNOSIS — M4800 Spinal stenosis, site unspecified: Secondary | ICD-10-CM | POA: Diagnosis not present

## 2017-06-17 DIAGNOSIS — M4800 Spinal stenosis, site unspecified: Secondary | ICD-10-CM | POA: Diagnosis not present

## 2017-06-18 ENCOUNTER — Telehealth: Payer: Self-pay | Admitting: Cardiology

## 2017-06-18 DIAGNOSIS — M4800 Spinal stenosis, site unspecified: Secondary | ICD-10-CM | POA: Diagnosis not present

## 2017-06-18 NOTE — Telephone Encounter (Signed)
LMOVM requesting that pt send manual transmission b/c home monitor has not updated in at least 14 days.    

## 2017-06-19 DIAGNOSIS — M4800 Spinal stenosis, site unspecified: Secondary | ICD-10-CM | POA: Diagnosis not present

## 2017-06-20 DIAGNOSIS — M4800 Spinal stenosis, site unspecified: Secondary | ICD-10-CM | POA: Diagnosis not present

## 2017-06-21 DIAGNOSIS — E662 Morbid (severe) obesity with alveolar hypoventilation: Secondary | ICD-10-CM | POA: Diagnosis not present

## 2017-06-21 DIAGNOSIS — J9611 Chronic respiratory failure with hypoxia: Secondary | ICD-10-CM | POA: Diagnosis not present

## 2017-06-21 DIAGNOSIS — M4800 Spinal stenosis, site unspecified: Secondary | ICD-10-CM | POA: Diagnosis not present

## 2017-06-21 DIAGNOSIS — M4647 Discitis, unspecified, lumbosacral region: Secondary | ICD-10-CM | POA: Diagnosis not present

## 2017-06-21 DIAGNOSIS — J410 Simple chronic bronchitis: Secondary | ICD-10-CM | POA: Diagnosis not present

## 2017-06-22 DIAGNOSIS — M4800 Spinal stenosis, site unspecified: Secondary | ICD-10-CM | POA: Diagnosis not present

## 2017-06-23 DIAGNOSIS — M4800 Spinal stenosis, site unspecified: Secondary | ICD-10-CM | POA: Diagnosis not present

## 2017-06-24 DIAGNOSIS — M4800 Spinal stenosis, site unspecified: Secondary | ICD-10-CM | POA: Diagnosis not present

## 2017-06-24 LAB — CUP PACEART REMOTE DEVICE CHECK
Date Time Interrogation Session: 20190422183749
Implantable Pulse Generator Implant Date: 20180420

## 2017-06-25 DIAGNOSIS — M4800 Spinal stenosis, site unspecified: Secondary | ICD-10-CM | POA: Diagnosis not present

## 2017-06-26 ENCOUNTER — Encounter (HOSPITAL_COMMUNITY): Payer: Self-pay | Admitting: Emergency Medicine

## 2017-06-26 ENCOUNTER — Emergency Department (HOSPITAL_COMMUNITY): Payer: Medicare HMO

## 2017-06-26 ENCOUNTER — Inpatient Hospital Stay (HOSPITAL_COMMUNITY)
Admission: EM | Admit: 2017-06-26 | Discharge: 2017-07-03 | DRG: 189 | Disposition: A | Discharge status: Skilled Nursing Facility | Payer: Medicare HMO | Attending: Internal Medicine | Admitting: Internal Medicine

## 2017-06-26 DIAGNOSIS — Z825 Family history of asthma and other chronic lower respiratory diseases: Secondary | ICD-10-CM | POA: Diagnosis not present

## 2017-06-26 DIAGNOSIS — Z95818 Presence of other cardiac implants and grafts: Secondary | ICD-10-CM

## 2017-06-26 DIAGNOSIS — F319 Bipolar disorder, unspecified: Secondary | ICD-10-CM | POA: Diagnosis present

## 2017-06-26 DIAGNOSIS — J9600 Acute respiratory failure, unspecified whether with hypoxia or hypercapnia: Secondary | ICD-10-CM | POA: Diagnosis present

## 2017-06-26 DIAGNOSIS — Z79890 Hormone replacement therapy (postmenopausal): Secondary | ICD-10-CM

## 2017-06-26 DIAGNOSIS — Z9981 Dependence on supplemental oxygen: Secondary | ICD-10-CM

## 2017-06-26 DIAGNOSIS — E669 Obesity, unspecified: Secondary | ICD-10-CM | POA: Diagnosis present

## 2017-06-26 DIAGNOSIS — E032 Hypothyroidism due to medicaments and other exogenous substances: Secondary | ICD-10-CM | POA: Diagnosis not present

## 2017-06-26 DIAGNOSIS — I10 Essential (primary) hypertension: Secondary | ICD-10-CM | POA: Diagnosis present

## 2017-06-26 DIAGNOSIS — G9340 Encephalopathy, unspecified: Secondary | ICD-10-CM | POA: Diagnosis not present

## 2017-06-26 DIAGNOSIS — Z833 Family history of diabetes mellitus: Secondary | ICD-10-CM | POA: Diagnosis not present

## 2017-06-26 DIAGNOSIS — I5032 Chronic diastolic (congestive) heart failure: Secondary | ICD-10-CM | POA: Diagnosis present

## 2017-06-26 DIAGNOSIS — R0600 Dyspnea, unspecified: Secondary | ICD-10-CM | POA: Diagnosis not present

## 2017-06-26 DIAGNOSIS — Z8249 Family history of ischemic heart disease and other diseases of the circulatory system: Secondary | ICD-10-CM

## 2017-06-26 DIAGNOSIS — Z881 Allergy status to other antibiotic agents status: Secondary | ICD-10-CM | POA: Diagnosis not present

## 2017-06-26 DIAGNOSIS — E039 Hypothyroidism, unspecified: Secondary | ICD-10-CM | POA: Diagnosis present

## 2017-06-26 DIAGNOSIS — R05 Cough: Secondary | ICD-10-CM | POA: Diagnosis not present

## 2017-06-26 DIAGNOSIS — G9341 Metabolic encephalopathy: Secondary | ICD-10-CM | POA: Diagnosis present

## 2017-06-26 DIAGNOSIS — Z79899 Other long term (current) drug therapy: Secondary | ICD-10-CM

## 2017-06-26 DIAGNOSIS — M1990 Unspecified osteoarthritis, unspecified site: Secondary | ICD-10-CM | POA: Diagnosis present

## 2017-06-26 DIAGNOSIS — I110 Hypertensive heart disease with heart failure: Secondary | ICD-10-CM | POA: Diagnosis present

## 2017-06-26 DIAGNOSIS — J9601 Acute respiratory failure with hypoxia: Secondary | ICD-10-CM | POA: Diagnosis not present

## 2017-06-26 DIAGNOSIS — J9621 Acute and chronic respiratory failure with hypoxia: Principal | ICD-10-CM | POA: Diagnosis present

## 2017-06-26 DIAGNOSIS — Z9884 Bariatric surgery status: Secondary | ICD-10-CM

## 2017-06-26 DIAGNOSIS — I5189 Other ill-defined heart diseases: Secondary | ICD-10-CM | POA: Diagnosis not present

## 2017-06-26 DIAGNOSIS — Z9089 Acquired absence of other organs: Secondary | ICD-10-CM

## 2017-06-26 DIAGNOSIS — N390 Urinary tract infection, site not specified: Secondary | ICD-10-CM | POA: Diagnosis present

## 2017-06-26 DIAGNOSIS — Z888 Allergy status to other drugs, medicaments and biological substances status: Secondary | ICD-10-CM

## 2017-06-26 DIAGNOSIS — R52 Pain, unspecified: Secondary | ICD-10-CM

## 2017-06-26 DIAGNOSIS — R2689 Other abnormalities of gait and mobility: Secondary | ICD-10-CM | POA: Diagnosis not present

## 2017-06-26 DIAGNOSIS — E873 Alkalosis: Secondary | ICD-10-CM | POA: Diagnosis present

## 2017-06-26 DIAGNOSIS — S92151A Displaced avulsion fracture (chip fracture) of right talus, initial encounter for closed fracture: Secondary | ICD-10-CM | POA: Diagnosis present

## 2017-06-26 DIAGNOSIS — M4800 Spinal stenosis, site unspecified: Secondary | ICD-10-CM | POA: Diagnosis not present

## 2017-06-26 DIAGNOSIS — Z9181 History of falling: Secondary | ICD-10-CM

## 2017-06-26 DIAGNOSIS — F32A Depression, unspecified: Secondary | ICD-10-CM | POA: Diagnosis present

## 2017-06-26 DIAGNOSIS — R55 Syncope and collapse: Secondary | ICD-10-CM | POA: Diagnosis not present

## 2017-06-26 DIAGNOSIS — F329 Major depressive disorder, single episode, unspecified: Secondary | ICD-10-CM | POA: Diagnosis present

## 2017-06-26 DIAGNOSIS — F250 Schizoaffective disorder, bipolar type: Secondary | ICD-10-CM | POA: Diagnosis not present

## 2017-06-26 DIAGNOSIS — E785 Hyperlipidemia, unspecified: Secondary | ICD-10-CM | POA: Diagnosis present

## 2017-06-26 DIAGNOSIS — K219 Gastro-esophageal reflux disease without esophagitis: Secondary | ICD-10-CM | POA: Diagnosis present

## 2017-06-26 DIAGNOSIS — M6281 Muscle weakness (generalized): Secondary | ICD-10-CM | POA: Diagnosis not present

## 2017-06-26 DIAGNOSIS — J440 Chronic obstructive pulmonary disease with acute lower respiratory infection: Secondary | ICD-10-CM | POA: Diagnosis present

## 2017-06-26 DIAGNOSIS — E662 Morbid (severe) obesity with alveolar hypoventilation: Secondary | ICD-10-CM | POA: Diagnosis present

## 2017-06-26 DIAGNOSIS — Z9049 Acquired absence of other specified parts of digestive tract: Secondary | ICD-10-CM

## 2017-06-26 DIAGNOSIS — R0602 Shortness of breath: Secondary | ICD-10-CM | POA: Diagnosis not present

## 2017-06-26 DIAGNOSIS — S93401A Sprain of unspecified ligament of right ankle, initial encounter: Secondary | ICD-10-CM | POA: Diagnosis not present

## 2017-06-26 DIAGNOSIS — Z6841 Body Mass Index (BMI) 40.0 and over, adult: Secondary | ICD-10-CM | POA: Diagnosis not present

## 2017-06-26 DIAGNOSIS — F310 Bipolar disorder, current episode hypomanic: Secondary | ICD-10-CM | POA: Diagnosis not present

## 2017-06-26 DIAGNOSIS — M2550 Pain in unspecified joint: Secondary | ICD-10-CM | POA: Diagnosis not present

## 2017-06-26 DIAGNOSIS — J209 Acute bronchitis, unspecified: Secondary | ICD-10-CM | POA: Diagnosis present

## 2017-06-26 DIAGNOSIS — I712 Thoracic aortic aneurysm, without rupture: Secondary | ICD-10-CM | POA: Diagnosis present

## 2017-06-26 DIAGNOSIS — F339 Major depressive disorder, recurrent, unspecified: Secondary | ICD-10-CM | POA: Diagnosis not present

## 2017-06-26 DIAGNOSIS — Z7401 Bed confinement status: Secondary | ICD-10-CM | POA: Diagnosis not present

## 2017-06-26 LAB — CBC WITH DIFFERENTIAL/PLATELET
Abs Immature Granulocytes: 0.1 10*3/uL (ref 0.0–0.1)
Basophils Absolute: 0 10*3/uL (ref 0.0–0.1)
Basophils Relative: 0 %
Eosinophils Absolute: 0.1 10*3/uL (ref 0.0–0.7)
Eosinophils Relative: 1 %
HCT: 40.8 % (ref 36.0–46.0)
Hemoglobin: 12.5 g/dL (ref 12.0–15.0)
Immature Granulocytes: 1 %
Lymphocytes Relative: 20 %
Lymphs Abs: 1.8 10*3/uL (ref 0.7–4.0)
MCH: 29.2 pg (ref 26.0–34.0)
MCHC: 30.6 g/dL (ref 30.0–36.0)
MCV: 95.3 fL (ref 78.0–100.0)
Monocytes Absolute: 0.5 10*3/uL (ref 0.1–1.0)
Monocytes Relative: 6 %
Neutro Abs: 6.6 10*3/uL (ref 1.7–7.7)
Neutrophils Relative %: 72 %
Platelets: 240 10*3/uL (ref 150–400)
RBC: 4.28 MIL/uL (ref 3.87–5.11)
RDW: 12.9 % (ref 11.5–15.5)
WBC: 9.1 10*3/uL (ref 4.0–10.5)

## 2017-06-26 LAB — I-STAT VENOUS BLOOD GAS, ED
Acid-Base Excess: 17 mmol/L — ABNORMAL HIGH (ref 0.0–2.0)
Acid-Base Excess: 18 mmol/L — ABNORMAL HIGH (ref 0.0–2.0)
Bicarbonate: 44.2 mmol/L — ABNORMAL HIGH (ref 20.0–28.0)
Bicarbonate: 43.4 mmol/L — ABNORMAL HIGH (ref 20.0–28.0)
O2 Saturation: 64 %
O2 Saturation: 80 %
TCO2: 46 mmol/L — ABNORMAL HIGH (ref 22–32)
TCO2: 45 mmol/L — ABNORMAL HIGH (ref 22–32)
pCO2, Ven: 63.6 mmHg — ABNORMAL HIGH (ref 44.0–60.0)
pCO2, Ven: 53.1 mmHg (ref 44.0–60.0)
pH, Ven: 7.45 — ABNORMAL HIGH (ref 7.250–7.430)
pH, Ven: 7.52 — ABNORMAL HIGH (ref 7.250–7.430)
pO2, Ven: 33 mmHg (ref 32.0–45.0)
pO2, Ven: 41 mmHg (ref 32.0–45.0)

## 2017-06-26 LAB — BASIC METABOLIC PANEL
Anion gap: 14 (ref 5–15)
BUN: 9 mg/dL (ref 6–20)
CO2: 35 mmol/L — ABNORMAL HIGH (ref 22–32)
Calcium: 9.1 mg/dL (ref 8.9–10.3)
Chloride: 90 mmol/L — ABNORMAL LOW (ref 101–111)
Creatinine, Ser: 0.86 mg/dL (ref 0.44–1.00)
GFR calc Af Amer: >60 mL/min (ref >60)
GFR calc non Af Amer: >60 mL/min (ref >60)
Glucose, Bld: 84 mg/dL (ref 65–99)
Potassium: 3.8 mmol/L (ref 3.5–5.1)
Sodium: 139 mmol/L (ref 135–145)

## 2017-06-26 LAB — I-STAT TROPONIN, ED
Troponin i, poc: 0 ng/mL (ref 0.00–0.08)
Troponin i, poc: 0 ng/mL (ref 0.00–0.08)

## 2017-06-26 LAB — BRAIN NATRIURETIC PEPTIDE: B Natriuretic Peptide: 52.5 pg/mL (ref 0.0–100.0)

## 2017-06-26 MED ORDER — LORAZEPAM 2 MG/ML IJ SOLN
1.0000 mg | Freq: Once | INTRAMUSCULAR | Status: AC
  Administered 2017-06-26: 1 mg via INTRAVENOUS
  Filled 2017-06-26: qty 1

## 2017-06-26 MED ORDER — IOPAMIDOL (ISOVUE-370) INJECTION 76%
INTRAVENOUS | Status: AC
  Filled 2017-06-26: qty 100

## 2017-06-26 MED ORDER — IOPAMIDOL (ISOVUE-370) INJECTION 76%
100.0000 mL | Freq: Once | INTRAVENOUS | Status: AC | PRN
  Administered 2017-06-26: 100 mL via INTRAVENOUS

## 2017-06-26 MED ORDER — LORAZEPAM 2 MG/ML IJ SOLN: 1.0000 mg | Freq: Once | INTRAMUSCULAR | Status: DC

## 2017-06-26 NOTE — ED Provider Notes (Signed)
[facility] EMERGENCY DEPARTMENT Provider Note   CSN: [id number] Arrival date & time: 06/26/17  1442     History   Chief Complaint Chief Complaint  Patient presents with  . Shortness of Breath    HPI Lindsey Rowe is a 72 y.o. female.  HPI patient is a 72 year old female with history as below who also has a 2 L oxygen requirement for approximately 1 year without diagnosis.  Patient currently undergoing evaluation with pulmonology.  Patient comes in today complaining of worsening shortness of breath for the past 2 days.  Patient states that she is normally on 2 L but has been requiring 4 L at home.  Patient endorses dyspnea on exertion as well as intermittent chest pain made worse by ambulation.  Patient denies any fevers chills nausea or vomiting.  Patient endorses increased swelling in bilateral lower extremities.  Patient denies diaphoresis but endorses dyspnea with speaking as well.    Past Medical History:  Diagnosis Date  . Arthritis   . Back pain   . Bipolar 1 disorder (HCC)   . Complication of anesthesia    hard to wake up anesthesia  . Depression   . GERD (gastroesophageal reflux disease)   . Headache(784.0)    migraines and tension headaches  . Hyperlipidemia   . Hypertension   . Hypothyroidism   . Thyroid disease     Patient Active Problem List   Diagnosis Date Noted  . Altered mental state 06/10/2017  . Dyspnea and respiratory abnormalities 08/29/2016  . Hypoxemia 08/29/2016  . Shoulder dislocation, right, initial encounter 05/24/2016  . 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 Hyperglycemia 05/24/2016  . Anterior dislocation of right shoulder   . Syncope 03/24/2016  . Generalized weakness 09/24/2015  . Leg pain, bilateral 09/24/2015  . Depression 09/24/2015  . Loss of appetite 09/24/2015  . Leg weakness, bilateral   . Hypothyroid 12/28/2012  . Hypovolemia 12/28/2012  . Hypotension 08/13/2012  . Dehydration 08/13/2012  . AKI  (acute kidney injury) (HCC) 08/13/2012  . Hypoventilation associated with obesity syndrome (HCC) 08/13/2012  . Acute respiratory failure (HCC) 07/26/2012  . Chest pain 07/26/2012  . ANKLE SPRAIN, RIGHT 03/16/2010  . DIVERTICULOSIS, COLON 10/31/2009  . SKIN LESION 09/23/2009  . Shoulder pain, right 07/05/2009  . COUGH 03/29/2009  . Migraine 03/11/2009  . OTITIS MEDIA, RIGHT 12/13/2008  . BRUISE 12/06/2008  . ACID REFLUX DISEASE 09/13/2008  . ALLERGIC RHINITIS 05/20/2008  . UNSPECIFIED DISEASE OF HAIR AND HAIR FOLLICLES 05/20/2008  . DERMATITIS 11/12/2007  . OBESITY 07/18/2007  . HYPERCHOLESTEROLEMIA 05/13/2007  . BIPOLAR AFFECTIVE DISORDER 05/12/2007  . HYPERTENSION, BENIGN ESSENTIAL 05/12/2007  . Low back pain potentially associated with radiculopathy 05/12/2007    Past Surgical History:  Procedure Laterality Date  . APPENDECTOMY    . BACK SURGERY    . EYE SURGERY Bilateral    lens implant  . GASTRIC BYPASS    . LOOP RECORDER INSERTION N/A 05/25/2016   Procedure: Loop Recorder Insertion;  Surgeon: Will Jorja Loa, MD;  Location: [facility] INVASIVE CV LAB;  Service: Cardiovascular;  Laterality: N/A;  . LUMBAR LAMINECTOMY/DECOMPRESSION MICRODISCECTOMY Right 07/23/2012   Procedure: LUMBAR LAMINECTOMY/DECOMPRESSION MICRODISCECTOMY 2 LEVELS;  Surgeon: Karn Cassis, MD;  Location: [facility] NEURO ORS;  Service: Neurosurgery;  Laterality: Right;  Right Lumbar three-four  lumbar four-five Laminectomy/Foraminotomy  . NASAL SINUS SURGERY Bilateral   . TONSILLECTOMY    . WRIST SURGERY       OB History  None      Home Medications    Prior to Admission medications   Medication Sig Start Date End Date Taking? Authorizing Provider  Aspirin-Salicylamide-Caffeine (BC HEADACHE POWDER PO) Take 1 packet by mouth as needed (headache, pain).   Yes [provider]  DULoxetine (CYMBALTA) 30 MG capsule Take 1 capsule (30 mg total) by mouth 2 (two) times daily. 05/26/16  Yes Pearson Grippe, MD    levothyroxine (SYNTHROID, LEVOTHROID) 75 MCG tablet Take 75 mcg by mouth daily before breakfast.  09/06/15  Yes [provider]  lisinopril (PRINIVIL,ZESTRIL) 10 MG tablet  08/15/16  Yes [provider]  meloxicam (MOBIC) 15 MG tablet Take 15 mg by mouth daily.  08/23/15  Yes [provider]  metoprolol succinate (TOPROL-XL) 50 MG 24 hr tablet Take 1 tablet (50 mg total) by mouth daily. Take with or immediately following a meal. 05/20/17 08/18/17 Yes Camnitz, Andree Coss, MD  omeprazole (PRILOSEC) 20 MG capsule Take 20 mg by mouth 2 (two) times daily before a meal.   Yes [provider]  penicillin v potassium (VEETID) 500 MG tablet Take 500 mg by mouth 2 (two) times daily. For 10 days. Started 06-07-17.   Yes [provider]  pravastatin (PRAVACHOL) 20 MG tablet Take 20 mg by mouth at bedtime.  09/06/15  Yes [provider]  pregabalin (LYRICA) 100 MG capsule Take 1 capsule (100 mg total) by mouth 2 (two) times daily. 09/27/15  Yes Vann, Jessica U, DO  SUMAtriptan (IMITREX) 100 MG tablet Take 100 mg by mouth as needed for headache. Take  at onset of headache, may repeat in 2 hours if needed. Do not exceed 2 doses in 24 hours. 04/03/16  Yes [provider]  traMADol (ULTRAM) 50 MG tablet Take 50 mg by mouth every 6 (six) hours as needed for pain.   Yes [provider]  traZODone (DESYREL) 100 MG tablet Take 300 mg by mouth at bedtime.    Yes [provider]  vitamin B-12 (CYANOCOBALAMIN) 1000 MCG tablet Take 1,000 mcg by mouth daily.   Yes [provider]  Cholecalciferol (D3-1000) 1000 units capsule Take 1,000 Units by mouth daily.    [provider]    Family History Family History  Problem Relation Age of Onset  . Heart disease Mother   . Emphysema Father   . Diabetes Sister   . Other Brother        Brain tumor - s/p excision    Social History Social History   Tobacco Use  . Smoking status:  Never Smoker  . Smokeless tobacco: Never Used  Substance Use Topics  . Alcohol use: Yes    Comment: social  . Drug use: No    Comment: Pt + opioids as prescribed     Allergies   Gabapentin and Cefadroxil   Review of Systems Review of Systems  Constitutional: Positive for fatigue. Negative for chills and fever.  Respiratory: Positive for cough and shortness of breath.   Cardiovascular: Positive for chest pain and leg swelling.  Gastrointestinal: Negative for abdominal pain.  Genitourinary: Negative for dysuria.  All other systems reviewed and are negative.    Physical Exam Updated Vital Signs BP (!) 168/86   Pulse 83   Temp 97.7 F (36.5 C) (Temporal)   Resp 19   SpO2 96%   Physical Exam  Constitutional: She appears well-developed and well-nourished. No distress.  HENT:  Head: Normocephalic and atraumatic.  Eyes: Conjunctivae are normal.  Neck: Neck supple.  Cardiovascular: Normal rate, regular rhythm and normal heart sounds.  No murmur heard. Pulmonary/Chest: Effort normal and breath sounds normal. No respiratory distress. She has no decreased breath sounds.  Abdominal: Soft. There is no tenderness.  Musculoskeletal: She exhibits no edema.  Neurological: She is alert.  Skin: Skin is warm and dry.  Psychiatric: She has a normal mood and affect.  Nursing note and vitals reviewed.    ED Treatments / Results  Labs (all labs ordered are listed, but only abnormal results are displayed) Labs Reviewed  BASIC METABOLIC PANEL - Abnormal; Notable for the following components:      Result Value   Chloride 90 (*)    CO2 35 (*)    All other components within normal limits  I-STAT VENOUS BLOOD GAS, ED - Abnormal; Notable for the following components:   pH, Ven 7.450 (*)    pCO2, Ven 63.6 (*)    Bicarbonate 44.2 (*)    TCO2 46 (*)    Acid-Base Excess 17.0 (*)    All other components within normal limits  I-STAT VENOUS BLOOD GAS, ED - Abnormal; Notable for the  following components:   pH, Ven 7.520 (*)    Bicarbonate 43.4 (*)    TCO2 45 (*)    Acid-Base Excess 18.0 (*)    All other components within normal limits  CBC WITH DIFFERENTIAL/PLATELET  BRAIN NATRIURETIC PEPTIDE  I-STAT TROPONIN, ED  I-STAT TROPONIN, ED    EKG EKG Interpretation  Date/Time:  Wednesday Jun 26 2017 14:46:28 EDT Ventricular Rate:  78 PR Interval:    QRS Duration: 92 QT Interval:  384 QTC Calculation: 438 R Axis:   82 Text Interpretation:  Sinus rhythm Borderline right axis deviation Low voltage, extremity and precordial leads Abnormal ekg Confirmed by Gerhard Munch [phone]) on 06/26/2017 2:53:28 PM   Radiology Ct Angio Chest Pe W And/or Wo Contrast  Result Date: 06/27/2017 CLINICAL DATA:  Worsening shortness of breath and cough EXAM: CT ANGIOGRAPHY CHEST WITH CONTRAST TECHNIQUE: Multidetector CT imaging of the chest was performed using the standard protocol during bolus administration of intravenous contrast. Multiplanar CT image reconstructions and MIPs were obtained to evaluate the vascular anatomy. CONTRAST:  ISOVUE-370 IOPAMIDOL (ISOVUE-370) INJECTION 76% COMPARISON:  Chest x-ray 06/26/2016, CT chest 05/01/2017, 09/25/2016 FINDINGS: Cardiovascular: Satisfactory opacification of the pulmonary arteries to the segmental level. No evidence of pulmonary embolism. Normal heart size. No pericardial effusion. 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 No dissection. Coronary vascular calcification. Mild aortic atherosclerosis. Mediastinum/Nodes: Midline trachea. No thyroid mass. No significant adenopathy. Esophagus within normal limits Lungs/Pleura: Atelectasis in the right upper lobe and lingula. No acute infiltrate or effusion. Upper Abdomen: Surgical clips in the left upper quadrant. No acute abnormality. Musculoskeletal: Degenerative changes of the spine. No acute or suspicious abnormality Review of the MIP images confirms the above findings.  IMPRESSION: 1. Negative for acute pulmonary embolus or aortic dissection 2. 4.2 cm ascending aortic aneurysm, no change. Recommend annual imaging followup by CTA or MRA. This recommendation follows 2010 ACCF/AHA/AATS/ACR/ASA/SCA/SCAI/SIR/STS/SVM Guidelines for the Diagnosis and Management of Patients with Thoracic Aortic Disease. Circulation. 2010; 121: I696-E952 3. No focal pulmonary infiltrate Aortic Atherosclerosis (ICD10-I70.0). Electronically Signed   By: [name] Pang M.D.   On: 06/27/2017 00:12   Dg Chest Port 1 View  Result Date: 06/26/2017 CLINICAL DATA:  Shortness of breath and productive cough for 1 day. EXAM: PORTABLE CHEST 1 VIEW COMPARISON:  06/09/2017 FINDINGS: The cardiac silhouette  is upper limits of normal in size, unchanged. The lungs are mildly hypoinflated with similar appearance of mild right hemidiaphragm elevation. No airspace consolidation, edema, sizable pleural effusion, or pneumothorax is identified. A loop recorder and left upper quadrant abdominal surgical clips are again noted. No acute osseous abnormality is seen. IMPRESSION: No active disease. Electronically Signed   By: Sebastian Ache M.D.   On: 06/26/2017 15:43    Procedures Procedures (including critical care time)  Medications Ordered in ED Medications  iopamidol (ISOVUE-370) 76 % injection (has no administration in time range)  LORazepam (ATIVAN) injection 1 mg (has no administration in time range)  LORazepam (ATIVAN) injection 1 mg (1 mg Intravenous Given 06/26/17 2210)  iopamidol (ISOVUE-370) 76 % injection 100 mL (100 mLs Intravenous Contrast Given 06/26/17 2346)     Initial Impression / Assessment and Plan / ED Course  I have reviewed the triage vital signs and the nursing notes.  Pertinent labs & imaging results that were available during my care of the patient were reviewed by me and considered in my medical decision making (see chart for details).     Patient's laboratory work-up largely within  normal limits without concerning findings.  Patient's previous EKG is consistent with EKG today.  Chest x-ray shows no acute abnormality, infiltrate or edema.  Attempted to walk patient, however patient became very short of breath and diaphoretic before she was even able to leave the room.  CTA of the chest was then collected to evaluate for pulmonary embolus, imaging revealed no acute abnormalities to explain patient's condition.  Given patient's increased oxygen requirement and shortness of breath on exertion, will admit for further evaluation and care.  Final Clinical Impressions(s) / ED Diagnoses   Final diagnoses:  SOB (shortness of breath)    ED Discharge Orders    None       Caren Griffins, MD 06/27/17 Lenice Pressman    Azalia Bilis, MD 06/27/17 2351

## 2017-06-26 NOTE — ED Triage Notes (Signed)
Pt arrives by [facility] from home due to worsening sob that has got worse over the last few days. Pt reports she has been wearing 2L [place] of O2 for the last year and was recently increased to 4L [place]. Pt states she hasn't been wearing he 4L because it has been "drying her nose out". Pt lives at home where she has care takers come in and help her, able to ambulate with walker at home but limited recently due to SOB.

## 2017-06-26 NOTE — ED Notes (Signed)
Pt attempting to ambulate in hall with this Tech and Lubertha Basque. Pt made it out of the room and stated that she needed to sit down. Pt became pale and diaphoretic. Pt given wet wash cloth and placed in a chair in the hallway. MD notified

## 2017-06-27 ENCOUNTER — Encounter (HOSPITAL_COMMUNITY): Payer: Self-pay | Admitting: Internal Medicine

## 2017-06-27 ENCOUNTER — Other Ambulatory Visit: Payer: Self-pay

## 2017-06-27 ENCOUNTER — Observation Stay (HOSPITAL_COMMUNITY): Payer: Medicare HMO

## 2017-06-27 DIAGNOSIS — J9601 Acute respiratory failure with hypoxia: Secondary | ICD-10-CM | POA: Diagnosis not present

## 2017-06-27 DIAGNOSIS — G9341 Metabolic encephalopathy: Secondary | ICD-10-CM

## 2017-06-27 DIAGNOSIS — I10 Essential (primary) hypertension: Secondary | ICD-10-CM | POA: Diagnosis not present

## 2017-06-27 DIAGNOSIS — I5189 Other ill-defined heart diseases: Secondary | ICD-10-CM

## 2017-06-27 DIAGNOSIS — N390 Urinary tract infection, site not specified: Secondary | ICD-10-CM

## 2017-06-27 DIAGNOSIS — M4800 Spinal stenosis, site unspecified: Secondary | ICD-10-CM | POA: Diagnosis not present

## 2017-06-27 LAB — BASIC METABOLIC PANEL
Anion gap: 12 (ref 5–15)
BUN: 9 mg/dL (ref 6–20)
CO2: 39 mmol/L — ABNORMAL HIGH (ref 22–32)
Calcium: 9.2 mg/dL (ref 8.9–10.3)
Chloride: 89 mmol/L — ABNORMAL LOW (ref 101–111)
Creatinine, Ser: 0.91 mg/dL (ref 0.44–1.00)
GFR calc Af Amer: >60 mL/min (ref >60)
GFR calc non Af Amer: >60 mL/min (ref >60)
Glucose, Bld: 121 mg/dL — ABNORMAL HIGH (ref 65–99)
Potassium: 3.8 mmol/L (ref 3.5–5.1)
Sodium: 140 mmol/L (ref 135–145)

## 2017-06-27 LAB — HEPATIC FUNCTION PANEL
ALT: 14 U/L (ref 14–54)
AST: 28 U/L (ref 15–41)
Albumin: 3.9 g/dL (ref 3.5–5.0)
Alkaline Phosphatase: 64 U/L (ref 38–126)
Bilirubin, Direct: 0.2 mg/dL (ref 0.1–0.5)
Indirect Bilirubin: 0.6 mg/dL (ref 0.3–0.9)
Total Bilirubin: 0.8 mg/dL (ref 0.3–1.2)
Total Protein: 6.9 g/dL (ref 6.5–8.1)

## 2017-06-27 LAB — URINALYSIS, ROUTINE W REFLEX MICROSCOPIC
Bilirubin Urine: NEGATIVE
Glucose, UA: NEGATIVE mg/dL
Ketones, ur: NEGATIVE mg/dL
Nitrite: NEGATIVE
Protein, ur: NEGATIVE mg/dL
Specific Gravity, Urine: 1.019 (ref 1.005–1.030)
pH: 8 (ref 5.0–8.0)

## 2017-06-27 LAB — CBC
HCT: 38.3 % (ref 36.0–46.0)
Hemoglobin: 11.8 g/dL — ABNORMAL LOW (ref 12.0–15.0)
MCH: 29.1 pg (ref 26.0–34.0)
MCHC: 30.8 g/dL (ref 30.0–36.0)
MCV: 94.3 fL (ref 78.0–100.0)
Platelets: 235 10*3/uL (ref 150–400)
RBC: 4.06 MIL/uL (ref 3.87–5.11)
RDW: 12.9 % (ref 11.5–15.5)
WBC: 9 10*3/uL (ref 4.0–10.5)

## 2017-06-27 LAB — VITAMIN B12: Vitamin B-12: 830 pg/mL (ref 180–914)

## 2017-06-27 LAB — SALICYLATE LEVEL: Salicylate Lvl: <7 mg/dL (ref 2.8–30.0)

## 2017-06-27 LAB — TSH: TSH: 2.955 u[IU]/mL (ref 0.350–4.500)

## 2017-06-27 LAB — HIV ANTIBODY (ROUTINE TESTING W REFLEX): HIV Screen 4th Generation wRfx: NONREACTIVE

## 2017-06-27 LAB — AMMONIA: Ammonia: 19 umol/L (ref 9–35)

## 2017-06-27 LAB — FOLATE: Folate: 13.5 ng/mL (ref >5.9)

## 2017-06-27 LAB — BRAIN NATRIURETIC PEPTIDE: B Natriuretic Peptide: 39.8 pg/mL (ref 0.0–100.0)

## 2017-06-27 LAB — TROPONIN I: Troponin I: <0.03 ng/mL (ref <0.03)

## 2017-06-27 MED ORDER — GUAIFENESIN-DM 100-10 MG/5ML PO SYRP: 5.0000 mL | ORAL_SOLUTION | ORAL | Status: DC | PRN

## 2017-06-27 MED ORDER — SODIUM CHLORIDE 0.9 % IV SOLN
1.0000 g | INTRAVENOUS | Status: DC
  Administered 2017-06-27 – 2017-06-29 (×3): 1 g via INTRAVENOUS
  Filled 2017-06-27 (×4): qty 10

## 2017-06-27 MED ORDER — ONDANSETRON HCL 4 MG/2ML IJ SOLN: 4.0000 mg | Freq: Four times a day (QID) | INTRAMUSCULAR | Status: DC | PRN

## 2017-06-27 MED ORDER — HYDRALAZINE HCL 20 MG/ML IJ SOLN: 10.0000 mg | INTRAMUSCULAR | Status: DC | PRN

## 2017-06-27 MED ORDER — ENOXAPARIN SODIUM 60 MG/0.6ML ~~LOC~~ SOLN
60.0000 mg | SUBCUTANEOUS | Status: DC
  Administered 2017-06-27 – 2017-07-03 (×7): 60 mg via SUBCUTANEOUS
  Filled 2017-06-27 (×7): qty 0.6

## 2017-06-27 MED ORDER — VITAMIN B-12 1000 MCG PO TABS
1000.0000 ug | ORAL_TABLET | Freq: Every day | ORAL | Status: DC
  Administered 2017-06-27 – 2017-07-03 (×7): 1000 ug via ORAL
  Filled 2017-06-27 (×8): qty 1

## 2017-06-27 MED ORDER — ACETAMINOPHEN 650 MG RE SUPP: 650.0000 mg | Freq: Four times a day (QID) | RECTAL | Status: DC | PRN

## 2017-06-27 MED ORDER — TRAZODONE HCL 50 MG PO TABS
300.0000 mg | ORAL_TABLET | Freq: Every day | ORAL | Status: DC
  Administered 2017-06-27 – 2017-07-02 (×6): 300 mg via ORAL
  Filled 2017-06-27 (×6): qty 6

## 2017-06-27 MED ORDER — TRAMADOL HCL 50 MG PO TABS
50.0000 mg | ORAL_TABLET | Freq: Four times a day (QID) | ORAL | Status: DC | PRN
  Administered 2017-06-30 – 2017-07-01 (×4): 50 mg via ORAL
  Filled 2017-06-27 (×4): qty 1

## 2017-06-27 MED ORDER — LEVOTHYROXINE SODIUM 75 MCG PO TABS
75.0000 ug | ORAL_TABLET | Freq: Every day | ORAL | Status: DC
  Administered 2017-06-27 – 2017-07-03 (×6): 75 ug via ORAL
  Filled 2017-06-27 (×7): qty 1

## 2017-06-27 MED ORDER — BENZONATATE 100 MG PO CAPS
100.0000 mg | ORAL_CAPSULE | Freq: Three times a day (TID) | ORAL | Status: DC
  Administered 2017-06-27 – 2017-07-03 (×17): 100 mg via ORAL
  Filled 2017-06-27 (×18): qty 1

## 2017-06-27 MED ORDER — METOPROLOL SUCCINATE ER 50 MG PO TB24
50.0000 mg | ORAL_TABLET | Freq: Every day | ORAL | Status: DC
  Administered 2017-06-27 – 2017-07-03 (×7): 50 mg via ORAL
  Filled 2017-06-27 (×8): qty 1

## 2017-06-27 MED ORDER — ORAL CARE MOUTH RINSE
15.0000 mL | Freq: Two times a day (BID) | OROMUCOSAL | Status: DC
  Administered 2017-06-27 – 2017-07-02 (×9): 15 mL via OROMUCOSAL

## 2017-06-27 MED ORDER — PREGABALIN 100 MG PO CAPS
100.0000 mg | ORAL_CAPSULE | Freq: Two times a day (BID) | ORAL | Status: DC
  Administered 2017-06-27 – 2017-07-03 (×13): 100 mg via ORAL
  Filled 2017-06-27 (×2): qty 1
  Filled 2017-06-27: qty 4
  Filled 2017-06-27 (×10): qty 1

## 2017-06-27 MED ORDER — DULOXETINE HCL 30 MG PO CPEP
30.0000 mg | ORAL_CAPSULE | Freq: Two times a day (BID) | ORAL | Status: DC
  Administered 2017-06-27 – 2017-07-03 (×13): 30 mg via ORAL
  Filled 2017-06-27 (×15): qty 1

## 2017-06-27 MED ORDER — ACETAMINOPHEN 325 MG PO TABS
650.0000 mg | ORAL_TABLET | Freq: Four times a day (QID) | ORAL | Status: DC | PRN
  Administered 2017-06-28 – 2017-07-03 (×5): 650 mg via ORAL
  Filled 2017-06-27 (×5): qty 2

## 2017-06-27 MED ORDER — LISINOPRIL 10 MG PO TABS
10.0000 mg | ORAL_TABLET | Freq: Every day | ORAL | Status: DC
  Administered 2017-06-27 – 2017-07-03 (×7): 10 mg via ORAL
  Filled 2017-06-27 (×7): qty 1

## 2017-06-27 MED ORDER — IPRATROPIUM-ALBUTEROL 0.5-2.5 (3) MG/3ML IN SOLN
3.0000 mL | Freq: Four times a day (QID) | RESPIRATORY_TRACT | Status: DC
Start: 2017-06-27 — End: 2017-06-29
  Administered 2017-06-27 – 2017-06-28 (×4): 3 mL via RESPIRATORY_TRACT
  Filled 2017-06-27 (×5): qty 3

## 2017-06-27 MED ORDER — PANTOPRAZOLE SODIUM 40 MG PO TBEC
40.0000 mg | DELAYED_RELEASE_TABLET | Freq: Every day | ORAL | Status: DC
  Administered 2017-06-27 – 2017-07-03 (×7): 40 mg via ORAL
  Filled 2017-06-27 (×8): qty 1

## 2017-06-27 MED ORDER — LORAZEPAM 2 MG/ML IJ SOLN
0.5000 mg | Freq: Once | INTRAMUSCULAR | Status: AC
  Administered 2017-06-27: 0.5 mg via INTRAVENOUS
  Filled 2017-06-27: qty 1

## 2017-06-27 MED ORDER — PRAVASTATIN SODIUM 40 MG PO TABS
20.0000 mg | ORAL_TABLET | Freq: Every day | ORAL | Status: DC
  Administered 2017-06-27 – 2017-07-02 (×6): 20 mg via ORAL
  Filled 2017-06-27 (×6): qty 1

## 2017-06-27 MED ORDER — PREDNISONE 20 MG PO TABS
40.0000 mg | ORAL_TABLET | Freq: Every day | ORAL | Status: DC
  Filled 2017-06-27: qty 2

## 2017-06-27 MED ORDER — ONDANSETRON HCL 4 MG PO TABS: 4.0000 mg | ORAL_TABLET | Freq: Four times a day (QID) | ORAL | Status: DC | PRN

## 2017-06-27 NOTE — H&P (Signed)
History and Physical    Lindsey Rowe RUE:[id number] DOB: [birth year] DOA: 06/26/2017  PCP: Maurice Small, MD   Patient coming from: Home.  Chief Complaint: Shortness of breath.  HPI: Lindsey Rowe is a 72 y.o. female with history of hypertension, diastolic dysfunction, hypothyroidism, bipolar disorder, hyperlipidemia presents to the ER because of shortness of breath.  Patient has been in shortness of breath and nonproductive cough for last 3 weeks.  Shortness of breath increased on exertion and at times when patient is hungry.  Denies any nausea vomiting abdominal pain fever chills.  Has not complained of any chest pain to me.  Since symptoms have been ongoing for last 3 weeks patient decided to come to the ER.  Patient also had a recent sleep study done results of which are not available.  ED Course: The ER patient was found to be hypoxic and had required 4 L oxygen.  CT angiogram of the chest was negative for any pulmonary embolism or any pneumonia.  Patient remained mildly hypoxic and lab work show metabolic alkalosis.  Salicylate levels were negative.  Patient's 2D echo done in February 2018 showed EF of 60 to 65% with grade 2 diastolic dysfunction but on exam patient does not have any obvious fluid overload.  Given her symptoms patient is admitted for further observation.  Patient was slightly anxious and had required Ativan in the ER.  Review of Systems: As per HPI, rest all negative.   Past Medical History:  Diagnosis Date  . Arthritis   . Back pain   . Bipolar 1 disorder (HCC)   . Complication of anesthesia    hard to wake up anesthesia  . Depression   . GERD (gastroesophageal reflux disease)   . Headache(784.0)    migraines and tension headaches  . Hyperlipidemia   . Hypertension   . Hypothyroidism   . Thyroid disease     Past Surgical History:  Procedure Laterality Date  . APPENDECTOMY    . BACK SURGERY    . EYE SURGERY Bilateral    lens implant  . GASTRIC  BYPASS    . LOOP RECORDER INSERTION N/A 05/25/2016   Procedure: Loop Recorder Insertion;  Surgeon: Will Jorja Loa, MD;  Location: [facility];  Service: Cardiovascular;  Laterality: N/A;  . LUMBAR LAMINECTOMY/DECOMPRESSION MICRODISCECTOMY Right 07/23/2012   Procedure: LUMBAR LAMINECTOMY/DECOMPRESSION MICRODISCECTOMY 2 LEVELS;  Surgeon: Karn Cassis, MD;  Location: [facility]UR[facility];  Service: Neurosurgery;  Laterality: Right;  Right Lumbar three-four  lumbar four-five Laminectomy/Foraminotomy  . NASAL SINUS SURGERY Bilateral   . TONSILLECTOMY    . WRIST SURGERY       reports that she has never smoked. She has never used smokeless tobacco. She reports that she drinks alcohol. She reports that she does not use drugs.  Allergies  Allergen Reactions  . Gabapentin Other (See Comments)    Right foot and leg swelled   . Cefadroxil Itching    Ends of hair itched     Family History  Problem Relation Age of Onset  . Heart disease Mother   . Emphysema Father   . Diabetes Sister   . Other Brother        Brain tumor - s/p excision    Prior to Admission medications   Medication Sig Start Date End Date Taking? Authorizing Provider  Aspirin-Salicylamide-Caffeine (BC HEADACHE POWDER PO) Take 1 packet by mouth as needed (headache, pain).   Yes [provider]  DULoxetine (CYMBALTA)  30 MG capsule Take 1 capsule (30 mg total) by mouth 2 (two) times daily. 05/26/16  Yes Pearson Grippe, MD  levothyroxine (SYNTHROID, LEVOTHROID) 75 MCG tablet Take 75 mcg by mouth daily before breakfast.  09/06/15  Yes [provider]  lisinopril (PRINIVIL,ZESTRIL) 10 MG tablet  08/15/16  Yes [provider]  meloxicam (MOBIC) 15 MG tablet Take 15 mg by mouth daily.  08/23/15  Yes [provider]  metoprolol succinate (TOPROL-XL) 50 MG 24 hr tablet Take 1 tablet (50 mg total) by mouth daily. Take with or immediately following a meal. 05/20/17 08/18/17 Yes Camnitz, Andree Coss, MD    omeprazole (PRILOSEC) 20 MG capsule Take 20 mg by mouth 2 (two) times daily before a meal.   Yes [provider]  penicillin v potassium (VEETID) 500 MG tablet Take 500 mg by mouth 2 (two) times daily. For 10 days. Started 06-07-17.   Yes [provider]  pravastatin (PRAVACHOL) 20 MG tablet Take 20 mg by mouth at bedtime.  09/06/15  Yes [provider]  pregabalin (LYRICA) 100 MG capsule Take 1 capsule (100 mg total) by mouth 2 (two) times daily. 09/27/15  Yes Vann, Jessica U, DO  SUMAtriptan (IMITREX) 100 MG tablet Take 100 mg by mouth as needed for headache. Take  at onset of headache, may repeat in 2 hours if needed. Do not exceed 2 doses in 24 hours. 04/03/16  Yes [provider]  traMADol (ULTRAM) 50 MG tablet Take 50 mg by mouth every 6 (six) hours as needed for pain.   Yes [provider]  traZODone (DESYREL) 100 MG tablet Take 300 mg by mouth at bedtime.    Yes [provider]  vitamin B-12 (CYANOCOBALAMIN) 1000 MCG tablet Take 1,000 mcg by mouth daily.   Yes [provider]  Cholecalciferol (D3-1000) 1000 units capsule Take 1,000 Units by mouth daily.    [provider]    Physical Exam: Vitals:   06/27/17 0045 06/27/17 0200 06/27/17 0215 06/27/17 0245  BP: (!) 160/99  (!) 154/83 (!) 160/94  Pulse: 72 94 91 96  Resp: Temp:      TempSrc:      SpO2: 95% 98% 96% 94%      Constitutional: Moderately built and nourished. Vitals:   06/27/17 0045 06/27/17 0200 06/27/17 0215 06/27/17 0245  BP: (!) 160/99  (!) 154/83 (!) 160/94  Pulse: 72 94 91 96  Resp: Temp:      TempSrc:      SpO2: 95% 98% 96% 94%   Eyes: Anicteric no pallor. ENMT: No discharge from the ears eyes nose or mouth. Neck: No mass palpated no JVD appreciated. Respiratory: No rhonchi or crepitations. Cardiovascular: S1-S2 heard no murmurs appreciated. Abdomen: Soft nontender bowel sounds present. Musculoskeletal: No  edema.  No joint effusion. Skin: No rash.  Skin appears warm. Neurologic: Alert awake oriented to time place and person.  Moves all extremities. Psychiatric: Appears normal.  Normal affect.   Labs on Admission: I have personally reviewed following labs and imaging studies  CBC: Recent Labs  Lab 06/26/17 1633  WBC 9.1  NEUTROABS 6.6  HGB 12.5  HCT 40.8  MCV 95.3  PLT 240   Basic Metabolic Panel: Recent Labs  Lab 06/26/17 1633  NA 139  K 3.8  CL 90*  CO2 35*  GLUCOSE 84  BUN 9  CREATININE 0.86  CALCIUM 9.1   GFR: CrCl cannot be  calculated (Unknown ideal weight.). Liver Function Tests: No results for input(s): AST, ALT, ALKPHOS, BILITOT, PROT, ALBUMIN in the last 168 hours. No results for input(s): LIPASE, AMYLASE in the last 168 hours. No results for input(s): AMMONIA in the last 168 hours. Coagulation Profile: No results for input(s): INR, PROTIME in the last 168 hours. Cardiac Enzymes: No results for input(s): CKTOTAL, CKMB, CKMBINDEX, TROPONINI in the last 168 hours. BNP (last 3 results) No results for input(s): PROBNP in the last 8760 hours. HbA1C: No results for input(s): HGBA1C in the last 72 hours. CBG: No results for input(s): GLUCAP in the last 168 hours. Lipid Profile: No results for input(s): CHOL, HDL, LDLCALC, TRIG, CHOLHDL, LDLDIRECT in the last 72 hours. Thyroid Function Tests: No results for input(s): TSH, T4TOTAL, FREET4, T3FREE, THYROIDAB in the last 72 hours. Anemia Panel: No results for input(s): VITAMINB12, FOLATE, FERRITIN, TIBC, IRON, RETICCTPCT in the last 72 hours. Urine analysis:    Component Value Date/Time   COLORURINE YELLOW 06/09/2017 0307   APPEARANCEUR CLEAR 06/09/2017 0307   LABSPEC 1.018 06/09/2017 0307   PHURINE 5.0 06/09/2017 0307   GLUCOSEU NEGATIVE 06/09/2017 0307   HGBUR NEGATIVE 06/09/2017 0307   HGBUR trace-lysed 09/23/2009 0000   BILIRUBINUR NEGATIVE 06/09/2017 0307   KETONESUR 5 (A) 06/09/2017 0307   PROTEINUR  NEGATIVE 06/09/2017 0307   UROBILINOGEN 1.0 08/13/2012 1757   NITRITE NEGATIVE 06/09/2017 0307   LEUKOCYTESUR NEGATIVE 06/09/2017 0307   Sepsis Labs: (procalcitonin:4,lacticidven:4) )No results found for this or any previous visit (from the past 240 hour(s)).   Radiological Exams on Admission: Ct Angio Chest Pe W And/or Wo Contrast  Result Date: 06/27/2017 CLINICAL DATA:  Worsening shortness of breath and cough EXAM: CT ANGIOGRAPHY CHEST WITH CONTRAST TECHNIQUE: Multidetector CT imaging of the chest was performed using the standard protocol during bolus administration of intravenous contrast. Multiplanar CT image reconstructions and MIPs were obtained to evaluate the vascular anatomy. CONTRAST:  ISOVUE-370 IOPAMIDOL (ISOVUE-370) INJECTION 76% COMPARISON:  Chest x-ray 06/26/2016, CT chest 05/01/2017, 09/25/2016 FINDINGS: Cardiovascular: Satisfactory opacification of the pulmonary arteries to the segmental level. No evidence of pulmonary embolism. Normal heart size. No pericardial effusion. 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 No dissection. Coronary vascular calcification. Mild aortic atherosclerosis. Mediastinum/Nodes: Midline trachea. No thyroid mass. No significant adenopathy. Esophagus within normal limits Lungs/Pleura: Atelectasis in the right upper lobe and lingula. No acute infiltrate or effusion. Upper Abdomen: Surgical clips in the left upper quadrant. No acute abnormality. Musculoskeletal: Degenerative changes of the spine. No acute or suspicious abnormality Review of the MIP images confirms the above findings. IMPRESSION: 1. Negative for acute pulmonary embolus or aortic dissection 2. 4.2 cm ascending aortic aneurysm, no change. Recommend annual imaging followup by CTA or MRA. This recommendation follows 2010 ACCF/AHA/AATS/ACR/ASA/SCA/SCAI/SIR/STS/SVM Guidelines for the Diagnosis and Management of Patients with Thoracic Aortic Disease. Circulation. 2010;  121: F621-H086 3. No focal pulmonary infiltrate Aortic Atherosclerosis (ICD10-I70.0). Electronically Signed   By: [name] Pang M.D.   On: 06/27/2017 00:12   Dg Chest Port 1 View  Result Date: 06/26/2017 CLINICAL DATA:  Shortness of breath and productive cough for 1 day. EXAM: PORTABLE CHEST 1 VIEW COMPARISON:  06/09/2017 FINDINGS: The cardiac silhouette is upper limits of normal in size, unchanged. The lungs are mildly hypoinflated with similar appearance of mild right hemidiaphragm elevation. No airspace consolidation, edema, sizable pleural effusion, or pneumothorax is identified. A loop recorder and left upper quadrant abdominal surgical clips are again noted. No acute osseous  abnormality is seen. IMPRESSION: No active disease. Electronically Signed   By: Sebastian Ache M.D.   On: 06/26/2017 15:43    EKG: Independently reviewed.  Normal sinus rhythm.  Assessment/Plan Principal Problem:   Acute respiratory failure with hypoxia (HCC) Active Problems:   BIPOLAR AFFECTIVE DISORDER   HYPERTENSION, BENIGN ESSENTIAL   Hypothyroid   Depression   Diastolic dysfunction    1. Acute respiratory failure with hypoxia -cause not clear.  We will closely monitor in telemetry recheck 2D echo.  Check respiratory viral panel since patient has cough.  If patient's 2D echo does show any abnormalities including LV dysfunction I will increase pulmonary artery hypertension may need cardiology evaluation.  2. Hypertension on lisinopril metoprolol.  Since blood pressure is mildly uncontrolled we will keep patient on PRN IV hydralazine for systolic blood pressure more than 160. 3. Hypothyroidism on Synthroid. 4. History of depression and anxiety on Cymbalta and trazodone. 5. Ascending aortic aneurysm seen in the CT chest is being followed by cardiothoracic surgeon.   DVT prophylaxis: Lovenox. Code Status: Full code. Family Communication: Discussed with patient. Disposition Plan: Home. Consults called:  None. Admission status: Observation.   Eduard Clos MD [facility] Pager [phone].  If 7PM-7AM, please contact night-coverage [email or web address] Password [email or web address]  06/27/2017, 2:58 AM

## 2017-06-27 NOTE — Plan of Care (Signed)
Pt admitted to 2c06 from ED with SOB and positve UTI. Oxygen 2l/min via N/c

## 2017-06-27 NOTE — ED Notes (Signed)
Pt got out of bed, removed all monitoring devices, hospital gown and came down the hall screaming for help. This RN and Robin, EMT managed to calm pt down and get her back into room. Pt sts she wants to leave and was put here against her will. Sts nobody is telling her what is going on. This RN explained to pt why she was in the hospital and the plan of care. Pt continuously repeating that she doesn't understand why we're keeping her. Admitting paged.

## 2017-06-27 NOTE — Progress Notes (Signed)
[facility]                                                                              Patient Demographics  Lindsey Rowe, is a 72 y.o. female, DOB - November 28, 1945, WUJ:[id number]  Admit date - 06/26/2017   Admitting Physician No admitting provider for patient encounter.  Outpatient Primary MD for the patient is Maurice Small, MD  Outpatient specialists:   LOS - 0  days   Medical records reviewed and are as summarized below:    Chief Complaint  Patient presents with  . Shortness of Breath       Brief summary   Patient is a 72 year old female with history of hypertension, diastolic dysfunction, hypothyroidism, bipolar disorder, hyperlipidemia presented to ED with shortness of breath, nonproductive cough for last 3 weeks.  Shortness of breath worse on exertion, no fevers or chills or chest pain.  In ED, was found to be hypoxic, required 4 L O2.  CTA negative for PE or pneumonia.  Mildly hypoxic and lab work showed metabolic alkalosis, salicylate levels negative.  Patient was confused and anxious in the ED, admitted for further work-up.   Assessment & Plan    Principal Problem:   Acute respiratory failure with hypoxia (HCC) -Unclear etiology, possibly due to diastolic dysfunction, acute bronchitis (has some wheezing on exam), obesity hypoventilation -Follow 2D echo, BNP 39.8 , troponin x 2 neg -Placed on scheduled duo nebs, Tessalon, IV Rocephin, prednisone 40 mg daily x 5 days -Home O2 evaluation prior to discharge -Outpatient work-up by pulmonology showed she has mild sleep apnea, recommended continue using 2 L O2 at sleep, recommended outpatient PFTs  Active Problems:   OBESITY -BMI 47.3, patient counseled on diet and weight control  Acute metabolic encephalopathy in the setting of bipolar disorder. /UTI  -UA not very consistent with UTI, follow urine cultures, for now placed on IV Rocephin -Continue Cymbalta, trazodone, -Follow ammonia level, B12,  folate    HYPERTENSION, BENIGN ESSENTIAL -Currently stable, continue Toprol-XL    Hypothyroid -TSH 2.9, continue Synthroid  Chronic diastolic dysfunction - Follow 2D echo, currently compensated -Prior echo 03/2016 showed EF of 60 to 65% with grade 2 diastolic dysfunction.,  Not on Lasix  Ascending aortic aneurysm -Seen on CT chest, 4.2 cm ascending aortic aneurysm, no change, recommended interval imaging follow-up by CTA or MRA  Code Status: Full CODE STATUS DVT Prophylaxis:  Lovenox Family Communication: Discussed in detail with the patient, all imaging results, lab results explained to the patient    Disposition Plan:   Time Spent in minutes  Procedures:  CTA chest  Consultants:   None   Antimicrobials:   IV Rocephin 5/23   Medications  Scheduled Meds: . benzonatate  100 mg Oral TID  . DULoxetine  30 mg Oral BID  . enoxaparin (LOVENOX) injection  60 mg Subcutaneous Q24H  . iopamidol      . ipratropium-albuterol  3 mL Nebulization Q6H  . levothyroxine  75 mcg Oral QAC breakfast  . lisinopril  10 mg Oral Daily  . metoprolol succinate  50 mg Oral Daily  . pantoprazole  40 mg Oral  Daily  . pravastatin  20 mg Oral QHS  . predniSONE  40 mg Oral Q breakfast  . pregabalin  100 mg Oral BID  . traZODone  300 mg Oral QHS  . vitamin B-12  1,000 mcg Oral Daily   Continuous Infusions: . cefTRIAXone (ROCEPHIN)  IV Stopped (06/27/17 0917)   PRN Meds:.acetaminophen **OR** acetaminophen, guaiFENesin-dextromethorphan, hydrALAZINE, ondansetron **OR** ondansetron (ZOFRAN) IV, traMADol   Antibiotics   Anti-infectives (From admission, onward)   Start     Dose/Rate Route Frequency Ordered Stop   06/27/17 0800  cefTRIAXone (ROCEPHIN) 1 g in sodium chloride 0.9 % 100 mL IVPB     1 g 200 mL/hr over 30 Minutes Intravenous Every 24 hours 06/27/17 0725          Subjective:   Shellsea Borunda was seen and examined today.  Currently much more alert and oriented,  denies any chest pain, feeling better. Patient denies dizziness, chest pain, abdominal pain, N/V/D/C, new weakness, numbess, tingling.  Objective:   Vitals:   06/27/17 0800 06/27/17 0918 06/27/17 0956 06/27/17 1000  BP: (!) 149/81  (!) 141/76 (!) 152/88  Pulse: (!) 103  96 (!) 102  Resp:    18  Temp:  97.9 F (36.6 C)    TempSrc:  Oral    SpO2: 93%   93%  Weight:      Height:        Intake/Output Summary (Last 24 hours) at 06/27/2017 1006 Last data filed at 06/27/2017 8657 Gross per 24 hour  Intake 100 ml  Output -  Net 100 ml     Wt Readings from Last 3 Encounters:  06/27/17 121.1 kg (266 lb 15.6 oz)  06/09/17 124.6 kg (274 lb 11.1 oz)  05/20/17 121.1 kg (267 lb)     Exam  General: Alert and oriented x2, NAD  Eyes:   HEENT:  Atraumatic, normocephalic, normal oropharynx  Cardiovascular: S1 S2 auscultated Regular rate and rhythm.  Respiratory: Mild expiratory wheezing bilaterally  Gastrointestinal: Soft, nontender, nondistended, + bowel sounds  Ext: no pedal edema bilaterally  Neuro: no new deficits  Musculoskeletal: No digital cyanosis, clubbing  Skin: No rashes  Psych: still slightly confused overall improving   Data Reviewed:  I have personally reviewed following labs and imaging studies  Micro Results No results found for this or any previous visit (from the past 240 hour(s)).  Radiology Reports Dg Chest 2 View  Result Date: 06/09/2017 CLINICAL DATA:  72 year old female with panic episodes and hallucination. EXAM: CHEST - 2 VIEW COMPARISON:  Chest CT dated 05/01/2017 FINDINGS: The lungs are clear. There is no pleural effusion or pneumothorax. Top-normal cardiac silhouette. A loop recorder device is noted. No acute osseous pathology. IMPRESSION: No active cardiopulmonary disease. Electronically Signed   By: Elgie Collard M.D.   On: 06/09/2017 03:59   Ct Head Wo Contrast  Result Date: 06/09/2017 CLINICAL DATA:  72 year old female with altered  mental status. EXAM: CT HEAD WITHOUT CONTRAST TECHNIQUE: Contiguous axial images were obtained from the base of the skull through the vertex without intravenous contrast. COMPARISON:  Head CT dated 03/23/2016 FINDINGS: Evaluation of this exam is limited due to motion artifact. Brain: There is mild age-related atrophy and chronic microvascular ischemic changes. No acute intracranial hemorrhage. No mass effect or midline shift. No extra-axial fluid collection. Vascular: No hyperdense vessel or unexpected calcification. Skull: Normal. Negative for fracture or focal lesion. Sinuses/Orbits: No acute finding. Other: None IMPRESSION: 1. No acute intracranial hemorrhage. 2. Mild age-related atrophy  and chronic microvascular ischemic changes. Electronically Signed   By: Elgie Collard M.D.   On: 06/09/2017 04:11   Ct Angio Chest Pe W And/or Wo Contrast  Result Date: 06/27/2017 CLINICAL DATA:  Worsening shortness of breath and cough EXAM: CT ANGIOGRAPHY CHEST WITH CONTRAST TECHNIQUE: Multidetector CT imaging of the chest was performed using the standard protocol during bolus administration of intravenous contrast. Multiplanar CT image reconstructions and MIPs were obtained to evaluate the vascular anatomy. CONTRAST:  ISOVUE-370 IOPAMIDOL (ISOVUE-370) INJECTION 76% COMPARISON:  Chest x-ray 06/26/2016, CT chest 05/01/2017, 09/25/2016 FINDINGS: Cardiovascular: Satisfactory opacification of the pulmonary arteries to the segmental level. No evidence of pulmonary embolism. Normal heart size. No pericardial effusion. 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 No dissection. Coronary vascular calcification. Mild aortic atherosclerosis. Mediastinum/Nodes: Midline trachea. No thyroid mass. No significant adenopathy. Esophagus within normal limits Lungs/Pleura: Atelectasis in the right upper lobe and lingula. No acute infiltrate or effusion. Upper Abdomen: Surgical clips in the left upper quadrant. No acute  abnormality. Musculoskeletal: Degenerative changes of the spine. No acute or suspicious abnormality Review of the MIP images confirms the above findings. IMPRESSION: 1. Negative for acute pulmonary embolus or aortic dissection 2. 4.2 cm ascending aortic aneurysm, no change. Recommend annual imaging followup by CTA or MRA. This recommendation follows 2010 ACCF/AHA/AATS/ACR/ASA/SCA/SCAI/SIR/STS/SVM Guidelines for the Diagnosis and Management of Patients with Thoracic Aortic Disease. Circulation. 2010; 121: Z610-R604 3. No focal pulmonary infiltrate Aortic Atherosclerosis (ICD10-I70.0). Electronically Signed   By: [name] Pang M.D.   On: 06/27/2017 00:12   Dg Chest Port 1 View  Result Date: 06/26/2017 CLINICAL DATA:  Shortness of breath and productive cough for 1 day. EXAM: PORTABLE CHEST 1 VIEW COMPARISON:  06/09/2017 FINDINGS: The cardiac silhouette is upper limits of normal in size, unchanged. The lungs are mildly hypoinflated with similar appearance of mild right hemidiaphragm elevation. No airspace consolidation, edema, sizable pleural effusion, or pneumothorax is identified. A loop recorder and left upper quadrant abdominal surgical clips are again noted. No acute osseous abnormality is seen. IMPRESSION: No active disease. Electronically Signed   By: Sebastian Ache M.D.   On: 06/26/2017 15:43    Lab Data:  CBC: Recent Labs  Lab 06/26/17 1633 06/27/17 0404  WBC 9.1 9.0  NEUTROABS 6.6  --   HGB 12.5 11.8*  HCT 40.8 38.3  MCV 95.3 94.3  PLT 240 235   Basic Metabolic Panel: Recent Labs  Lab 06/26/17 1633 06/27/17 0404  NA 139 140  K 3.8 3.8  CL 90* 89*  CO2 35* 39*  GLUCOSE 84 121*  BUN 9 9  CREATININE 0.86 0.91  CALCIUM 9.1 9.2   GFR: Estimated Creatinine Clearance: 72.6 mL/min (by C-G formula based on SCr of 0.91 mg/dL). Liver Function Tests: Recent Labs  Lab 06/27/17 0218  AST 28  ALT 14  ALKPHOS 64  BILITOT 0.8  PROT 6.9  ALBUMIN 3.9   No results for input(s):  LIPASE, AMYLASE in the last 168 hours. No results for input(s): AMMONIA in the last 168 hours. Coagulation Profile: No results for input(s): INR, PROTIME in the last 168 hours. Cardiac Enzymes: Recent Labs  Lab 06/27/17 0754  TROPONINI <0.03   BNP (last 3 results) No results for input(s): PROBNP in the last 8760 hours. HbA1C: No results for input(s): HGBA1C in the last 72 hours. CBG: No results for input(s): GLUCAP in the last 168 hours. Lipid Profile: No results for input(s): CHOL, HDL, LDLCALC, TRIG, CHOLHDL, LDLDIRECT in  the last 72 hours. Thyroid Function Tests: Recent Labs    06/27/17 0405  TSH 2.955   Anemia Panel: No results for input(s): VITAMINB12, FOLATE, FERRITIN, TIBC, IRON, RETICCTPCT in the last 72 hours. Urine analysis:    Component Value Date/Time   COLORURINE YELLOW 06/27/2017 0613   APPEARANCEUR HAZY (A) 06/27/2017 0613   LABSPEC 1.019 06/27/2017 0613   PHURINE 8.0 06/27/2017 0613   GLUCOSEU NEGATIVE 06/27/2017 0613   HGBUR SMALL (A) 06/27/2017 0613   HGBUR trace-lysed 09/23/2009 0000   BILIRUBINUR NEGATIVE 06/27/2017 0613   KETONESUR NEGATIVE 06/27/2017 0613   PROTEINUR NEGATIVE 06/27/2017 0613   UROBILINOGEN 1.0 08/13/2012 1757   NITRITE NEGATIVE 06/27/2017 0613   LEUKOCYTESUR MODERATE (A) 06/27/2017 1610     Wylie Coon M.D. [facility] 06/27/2017, 10:06 AM  Pager: [phone] Between 7am to 7pm - call Pager - [phone]  After 7pm go to [email or web address] - password [email or web address]  Call night coverage person covering after 7pm

## 2017-06-28 ENCOUNTER — Observation Stay (HOSPITAL_BASED_OUTPATIENT_CLINIC_OR_DEPARTMENT_OTHER): Payer: Medicare HMO

## 2017-06-28 ENCOUNTER — Observation Stay (HOSPITAL_COMMUNITY): Payer: Medicare HMO

## 2017-06-28 ENCOUNTER — Observation Stay: Payer: Medicare HMO

## 2017-06-28 DIAGNOSIS — M4800 Spinal stenosis, site unspecified: Secondary | ICD-10-CM | POA: Diagnosis not present

## 2017-06-28 DIAGNOSIS — R0600 Dyspnea, unspecified: Secondary | ICD-10-CM

## 2017-06-28 DIAGNOSIS — S93401A Sprain of unspecified ligament of right ankle, initial encounter: Secondary | ICD-10-CM | POA: Diagnosis not present

## 2017-06-28 DIAGNOSIS — I10 Essential (primary) hypertension: Secondary | ICD-10-CM | POA: Diagnosis not present

## 2017-06-28 DIAGNOSIS — J9601 Acute respiratory failure with hypoxia: Secondary | ICD-10-CM | POA: Diagnosis not present

## 2017-06-28 DIAGNOSIS — G9341 Metabolic encephalopathy: Secondary | ICD-10-CM | POA: Diagnosis not present

## 2017-06-28 DIAGNOSIS — I5189 Other ill-defined heart diseases: Secondary | ICD-10-CM | POA: Diagnosis not present

## 2017-06-28 DIAGNOSIS — N390 Urinary tract infection, site not specified: Secondary | ICD-10-CM | POA: Diagnosis not present

## 2017-06-28 LAB — CBC
HCT: 40.9 % (ref 36.0–46.0)
Hemoglobin: 12.4 g/dL (ref 12.0–15.0)
MCH: 29.3 pg (ref 26.0–34.0)
MCHC: 30.3 g/dL (ref 30.0–36.0)
MCV: 96.7 fL (ref 78.0–100.0)
Platelets: 229 10*3/uL (ref 150–400)
RBC: 4.23 MIL/uL (ref 3.87–5.11)
RDW: 13.2 % (ref 11.5–15.5)
WBC: 7.6 10*3/uL (ref 4.0–10.5)

## 2017-06-28 LAB — ECHOCARDIOGRAM COMPLETE
Height: 63 in
Weight: 4119.96 oz

## 2017-06-28 LAB — RESPIRATORY PANEL BY PCR
Adenovirus: DETECTED — AB
Bordetella pertussis: NOT DETECTED
Chlamydophila pneumoniae: NOT DETECTED
Coronavirus 229E: NOT DETECTED
Coronavirus HKU1: NOT DETECTED
Coronavirus NL63: NOT DETECTED
Coronavirus OC43: NOT DETECTED
Influenza A: NOT DETECTED
Influenza B: NOT DETECTED
Metapneumovirus: NOT DETECTED
Mycoplasma pneumoniae: NOT DETECTED
Parainfluenza Virus 1: NOT DETECTED
Parainfluenza Virus 2: NOT DETECTED
Parainfluenza Virus 3: NOT DETECTED
Parainfluenza Virus 4: NOT DETECTED
Respiratory Syncytial Virus: NOT DETECTED
Rhinovirus / Enterovirus: NOT DETECTED

## 2017-06-28 LAB — URINE CULTURE

## 2017-06-28 LAB — BASIC METABOLIC PANEL
Anion gap: 12 (ref 5–15)
BUN: 8 mg/dL (ref 6–20)
CO2: 39 mmol/L — ABNORMAL HIGH (ref 22–32)
Calcium: 9.2 mg/dL (ref 8.9–10.3)
Chloride: 93 mmol/L — ABNORMAL LOW (ref 101–111)
Creatinine, Ser: 0.91 mg/dL (ref 0.44–1.00)
GFR calc Af Amer: >60 mL/min (ref >60)
GFR calc non Af Amer: >60 mL/min (ref >60)
Glucose, Bld: 105 mg/dL — ABNORMAL HIGH (ref 65–99)
Potassium: 3.5 mmol/L (ref 3.5–5.1)
Sodium: 144 mmol/L (ref 135–145)

## 2017-06-28 LAB — EXPECTORATED SPUTUM ASSESSMENT W GRAM STAIN, RFLX TO RESP C

## 2017-06-28 LAB — EXPECTORATED SPUTUM ASSESSMENT W REFEX TO RESP CULTURE

## 2017-06-28 MED ORDER — METHYLPREDNISOLONE SODIUM SUCC 125 MG IJ SOLR
60.0000 mg | Freq: Two times a day (BID) | INTRAMUSCULAR | Status: DC
  Administered 2017-06-28 – 2017-06-29 (×3): 60 mg via INTRAVENOUS
  Filled 2017-06-28 (×3): qty 2

## 2017-06-28 MED ORDER — GUAIFENESIN ER 600 MG PO TB12
600.0000 mg | ORAL_TABLET | Freq: Two times a day (BID) | ORAL | Status: DC
  Administered 2017-06-28 – 2017-07-03 (×11): 600 mg via ORAL
  Filled 2017-06-28 (×11): qty 1

## 2017-06-28 MED ORDER — FLUTICASONE PROPIONATE 50 MCG/ACT NA SUSP
1.0000 | Freq: Every day | NASAL | Status: DC
  Administered 2017-06-28: 1 via NASAL
  Filled 2017-06-28: qty 16

## 2017-06-28 MED ORDER — LORATADINE 10 MG PO TABS
10.0000 mg | ORAL_TABLET | Freq: Every day | ORAL | Status: DC
  Administered 2017-06-28 – 2017-07-03 (×6): 10 mg via ORAL
  Filled 2017-06-28 (×6): qty 1

## 2017-06-28 MED ORDER — MOMETASONE FURO-FORMOTEROL FUM 100-5 MCG/ACT IN AERO
2.0000 | INHALATION_SPRAY | Freq: Two times a day (BID) | RESPIRATORY_TRACT | Status: DC
  Administered 2017-06-28 – 2017-07-03 (×9): 2 via RESPIRATORY_TRACT
  Filled 2017-06-28: qty 8.8

## 2017-06-28 MED ORDER — PERFLUTREN LIPID MICROSPHERE
INTRAVENOUS | Status: AC
  Filled 2017-06-28: qty 10

## 2017-06-28 MED ORDER — PERFLUTREN LIPID MICROSPHERE
1.0000 mL | INTRAVENOUS | Status: AC | PRN
  Administered 2017-06-28: 2 mL via INTRAVENOUS
  Filled 2017-06-28: qty 10

## 2017-06-28 NOTE — Progress Notes (Signed)
  Echocardiogram 2D Echocardiogram has been performed.  Lindsey Rowe 06/28/2017, 11:32 AM

## 2017-06-28 NOTE — Evaluation (Signed)
Physical Therapy Evaluation Patient Details Name: Lindsey Rowe MRN: [id number] DOB: 1945-07-31 Today's Date: 06/28/2017   History of Present Illness     Clinical Impression  Pt pleasant and willing to participate with therapy. Pt presents with R foot pain at arch that is Tender to palpation but no redness or swelling visible. Pt demonstrates limited functional mobility due to weakness and pain as pt is hesitant to bear weight on RLE. Pt would benefit from skilled acute PT to improve level of functional mobility and level of independence. SNF currently recommended due to limited mobility demonstrated and report of intermittent supervision at home    Follow Up Recommendations Supervision/Assistance - 24 hour;SNF    Equipment Recommendations  None recommended by PT    Recommendations for Other Services       Precautions / Restrictions Precautions Precautions: None Restrictions Other Position/Activity Restrictions: Pt c/o R foot pain; taken for x-ray after PT session      Mobility  Bed Mobility Overal bed mobility: Needs Assistance Bed Mobility: Supine to Sit     Supine to sit: Min guard;HOB elevated     General bed mobility comments: PT performed supine>sit EOB with HOB elevated and increased time and effort required. Min guard for safety  Transfers Overall transfer level: Needs assistance Equipment used: Rolling walker (2 wheeled) Transfers: Sit to/from [employer] Sit to Stand: Min assist;+2 safety/equipment Stand pivot transfers: Mod assist;+2 safety/equipment       General transfer comment: Pt c/o R foot pain with all mobility resulting in difficulty with transfers. Pt attempted sit to stand from bed but did not power up fully into standing due to pain. Upon second attempt with 2nd therapist present; pt able to perform sit>stand min A. Pt then pivoted to recliner chair with RW Mod A for balance and to assist with direction of RW. Pt required frequent  cueing and encouragement to perform transfers  Ambulation/Gait             General Gait Details: Unable to attempt due to R foot pain  Stairs            Wheelchair Mobility    Modified Rankin (Stroke Patients Only)       Balance Overall balance assessment: Needs assistance Sitting-balance support: No upper extremity supported Sitting balance-Leahy Scale: Fair       Standing balance-Leahy Scale: Poor Standing balance comment: Heavy reliance on RW for standing balance                             Pertinent Vitals/Pain Pain Assessment: 0-10 Pain Score: 9  Pain Location: R foot at arch Pain Descriptors / Indicators: Discomfort Pain Intervention(s): Limited activity within patient's tolerance;Monitored during session    Home Living Family/patient expects to be discharged to:: Private residence Living Arrangements: Alone Available Help at Discharge: Available PRN/intermittently;Friend(s)(Caregiver 3-4 hours per day; friend for a few hours) Type of Home: Apartment Home Access: Level entry     Home Layout: One level Home Equipment: Walker - 2 wheels;Walker - 4 wheels;Grab bars - toilet(Rollator broken brake)      Prior Function Level of Independence: Independent with assistive device(s)         Comments: Pt reports being able to perform ADLs and go to grocery store. Pt reports fall 2 weeks ago      Hand Dominance        Extremity/Trunk Assessment   Upper Extremity Assessment Upper  Extremity Assessment: Overall WFL for tasks assessed    Lower Extremity Assessment Lower Extremity Assessment: Generalized weakness       Communication   Communication: No difficulties  Cognition Arousal/Alertness: Awake/alert Behavior During Therapy: WFL for tasks assessed/performed Overall Cognitive Status: Within Functional Limits for tasks assessed                                 General Comments: Pt able to follow commands and carry  conversation. Pt did ask what hospital she was in during the session      General Comments      Exercises     Assessment/Plan    PT Assessment Patient needs continued PT services  PT Problem List Decreased strength;Decreased activity tolerance;Decreased balance;Decreased mobility;Decreased coordination;Decreased knowledge of use of DME;Decreased cognition;Decreased safety awareness;Cardiopulmonary status limiting activity;Obesity;Pain       PT Treatment Interventions DME instruction;Gait training;Functional mobility training;Therapeutic activities;Therapeutic exercise;Balance training;Neuromuscular re-education;Patient/family education;Cognitive remediation;Stair training    PT Goals (Current goals can be found in the Care Plan section)  Acute Rehab PT Goals Patient Stated Goal: To go home PT Goal Formulation: With patient Time For Goal Achievement: 07/12/17 Potential to Achieve Goals: Fair    Frequency Min 3X/week   Barriers to discharge Decreased caregiver support Pt has intermittent support; 24 hour supervision recommended    Co-evaluation               AM-PAC PT "6 Clicks" Daily Activity  Outcome Measure Difficulty turning over in bed (including adjusting bedclothes, sheets and blankets)?: A Little Difficulty moving from lying on back to sitting on the side of the bed? : A Little Difficulty sitting down on and standing up from a chair with arms (e.g., wheelchair, bedside commode, etc,.)?: Unable Help needed moving to and from a bed to chair (including a wheelchair)?: A Lot Help needed walking in hospital room?: Total Help needed climbing 3-5 steps with a railing? : Total 6 Click Score: 11    End of Session Equipment Utilized During Treatment: Gait belt;Oxygen Activity Tolerance: Patient limited by pain Patient left: in chair;with call bell/phone within reach Nurse Communication: Mobility status;Other (comment)(R foot pain) PT Visit Diagnosis: Unsteadiness on  feet (R26.81);Other abnormalities of gait and mobility (R26.89);Muscle weakness (generalized) (M62.81);Pain Pain - Right/Left: Right Pain - part of body: Ankle and joints of foot    Time: 1610-9604 PT Time Calculation (min) (ACUTE ONLY): 40 min   Charges:   PT Evaluation $PT Eval Moderate Complexity: 1 Mod PT Treatments $Therapeutic Activity: 8-22 mins   PT G Codes:        Gabe Tameria Patti, SPT  [employer] 06/28/2017, 11:56 AM

## 2017-06-28 NOTE — Progress Notes (Signed)
Orthopedic Tech Progress Note Patient Details:  Lindsey Rowe 02/23/1945 [id number]  Ortho Devices Type of Ortho Device: CAM walker Ortho Device/Splint Interventions: Application   Post Interventions Patient Tolerated: Well Instructions Provided: Care of device   Saul Fordyce 06/28/2017, 4:35 PM

## 2017-06-28 NOTE — Progress Notes (Signed)
[facility]                                                                              Patient Demographics  Lindsey Rowe, is a 72 y.o. female, DOB - 1945/07/09, ZOX:[id number]  Admit date - 06/26/2017   Admitting Physician Lindsey Clos, MD  Outpatient Primary MD for the patient is Lindsey Small, MD  Outpatient specialists:   LOS - 0  days   Medical records reviewed and are as summarized below:    Chief Complaint  Patient presents with  . Shortness of Breath       Brief summary   Patient is a 72 year old female with history of hypertension, diastolic dysfunction, hypothyroidism, bipolar disorder, hyperlipidemia presented to ED with shortness of breath, nonproductive cough for last 3 weeks.  Shortness of breath worse on exertion, no fevers or chills or chest pain.  In ED, was found to be hypoxic, required 4 L O2.  CTA negative for PE or pneumonia.  Mildly hypoxic and lab work showed metabolic alkalosis, salicylate levels negative.  Patient was confused and anxious in the ED, admitted for further work-up.   Assessment & Plan    Principal Problem:   Acute respiratory failure with hypoxia (HCC) likely due to acute bronchitis, underlying COPD? -possibly due to diastolic dysfunction, acute bronchitis, still has some wheezing, obesity hypoventilation -BNP 39.8, troponins negative, f/u echo  -Continue scheduled duo nebs, placed on IV Solu-Medrol 60 mg every 12 hours, still wheezing.   - Placed on Dulera BID, needs outpatient PFTs.  Home O2 evaluation prior to discharge   Active Problems:   OBESITY -BMI 47.3, patient counseled on diet and weight control  Acute metabolic encephalopathy in the setting of bipolar disorder. /UTI  -Mental status clear, close to baseline  - UA not very consistent with UTI, urine culture showed multiple species, for now placed on IV Rocephin for 3 days total -Continue Cymbalta, trazodone,  Recent fall, right ankle  pain -Patient reports fall a week ago, right ankle x-ray obtained shows Rowe avulsed fragment of the anterior distal talus -Discussed with orthopedics, recommended cam walker boot right foot, weightbearing as tolerated, follow-up with Lindsey Rowe in 1 week after discharge    HYPERTENSION, BENIGN ESSENTIAL -Currently stable, continue Toprol-XL    Hypothyroid -TSH 2.9, continue Synthroid  Chronic diastolic dysfunction - Follow 2D echo, currently compensated -Prior echo 03/2016 showed EF of 60 to 65% with grade 2 diastolic dysfunction.,  Not on Lasix  Ascending aortic aneurysm -Seen on CT chest, 4.2 cm ascending aortic aneurysm, no change, recommended interval imaging follow-up by CTA or MRA  Code Status: Full CODE STATUS DVT Prophylaxis:  Lovenox Family Communication: Discussed in detail with the patient, all imaging results, lab results explained to the patient    Disposition Plan:   Time Spent in minutes 25 minutes  Procedures:  CTA chest  Consultants:   None   Antimicrobials:   IV Rocephin 5/23   Medications  Scheduled Meds: . benzonatate  100 mg Oral TID  . DULoxetine  30 mg Oral BID  . enoxaparin (LOVENOX) injection  60 mg Subcutaneous Q24H  .  fluticasone  1 spray Each Nare Daily  . guaiFENesin  600 mg Oral BID  . ipratropium-albuterol  3 mL Nebulization Q6H  . levothyroxine  75 mcg Oral QAC breakfast  . lisinopril  10 mg Oral Daily  . loratadine  10 mg Oral Daily  . mouth rinse  15 mL Mouth Rinse BID  . methylPREDNISolone (SOLU-MEDROL) injection  60 mg Intravenous Q12H  . metoprolol succinate  50 mg Oral Daily  . mometasone-formoterol  2 puff Inhalation BID  . pantoprazole  40 mg Oral Daily  . pravastatin  20 mg Oral QHS  . pregabalin  100 mg Oral BID  . traZODone  300 mg Oral QHS  . vitamin B-12  1,000 mcg Oral Daily   Continuous Infusions: . cefTRIAXone (ROCEPHIN)  IV Stopped (06/28/17 0851)   PRN Meds:.acetaminophen **OR** acetaminophen,  guaiFENesin-dextromethorphan, hydrALAZINE, ondansetron **OR** ondansetron (ZOFRAN) IV, traMADol   Antibiotics   Anti-infectives (From admission, onward)   Start     Dose/Rate Route Frequency Ordered Stop   06/27/17 0800  cefTRIAXone (ROCEPHIN) 1 g in sodium chloride 0.9 % 100 mL IVPB     1 g 200 mL/hr over 30 Minutes Intravenous Every 24 hours 06/27/17 0725          Subjective:   Lindsey Rowe was seen and examined today.  Still coughing and bringing phlegm, wheezing, right ankle pain.  Alert and oriented.  Patient denies dizziness, chest pain, abdominal pain, N/V/D/C, new weakness, numbess, tingling.  Objective:   Vitals:   06/28/17 0726 06/28/17 0751 06/28/17 1014 06/28/17 1122  BP:  109/70  130/70  Pulse:  96  69  Resp:  (!) 22  16  Temp:  98.5 F (36.9 C)  97.8 F (36.6 C)  TempSrc:  Oral  Oral  SpO2: 96% 95% (!) 88% 96%  Weight:      Height:        Intake/Output Summary (Last 24 hours) at 06/28/2017 1304 Last data filed at 06/28/2017 1000 Gross per 24 hour  Intake 700 ml  Output 600 ml  Net 100 ml     Wt Readings from Last 3 Encounters:  06/28/17 116.8 kg (257 lb 8 oz)  06/09/17 124.6 kg (274 lb 11.1 oz)  05/20/17 121.1 kg (267 lb)     Exam    General: Alert and oriented x 3, NAD  Eyes:   HEENT:  Atraumatic, normocephalic  Cardiovascular: S1 S2 auscultated, . Regular rate and rhythm. No pedal edema b/l  Respiratory: Bilateral rhonchi with wheezing  Gastrointestinal: Soft, nontender, nondistended, + bowel sounds  Ext: no pedal edema bilaterally  Neuro: no new deficits  Musculoskeletal: No digital cyanosis, clubbing  Skin: No rashes  Psych: Normal affect and demeanor, alert and oriented x3    Data Reviewed:  I have personally reviewed following labs and imaging studies  Micro Results Recent Results (from the past 240 hour(s))  Urine culture     Status: Abnormal   Collection Time: 06/27/17  6:13 AM  Result Value Ref Range Status    Specimen Description URINE, CLEAN CATCH  Final   Special Requests   Final    NONE Performed at [facility], [address] [place], [place] [address]    Culture MULTIPLE SPECIES PRESENT, SUGGEST RECOLLECTION (A)  Final   Report Status 06/28/2017 FINAL  Final  Respiratory Panel by PCR     Status: Abnormal   Collection Time: 06/27/17  6:20 AM  Result Value Ref Range Status  Adenovirus DETECTED (A) NOT DETECTED Final   Coronavirus 229E NOT DETECTED NOT DETECTED Final   Coronavirus HKU1 NOT DETECTED NOT DETECTED Final   Coronavirus NL63 NOT DETECTED NOT DETECTED Final   Coronavirus OC43 NOT DETECTED NOT DETECTED Final   Metapneumovirus NOT DETECTED NOT DETECTED Final   Rhinovirus / Enterovirus NOT DETECTED NOT DETECTED Final   Influenza A NOT DETECTED NOT DETECTED Final   Influenza B NOT DETECTED NOT DETECTED Final   Parainfluenza Virus 1 NOT DETECTED NOT DETECTED Final   Parainfluenza Virus 2 NOT DETECTED NOT DETECTED Final   Parainfluenza Virus 3 NOT DETECTED NOT DETECTED Final   Parainfluenza Virus 4 NOT DETECTED NOT DETECTED Final   Respiratory Syncytial Virus NOT DETECTED NOT DETECTED Final   Bordetella pertussis NOT DETECTED NOT DETECTED Final   Chlamydophila pneumoniae NOT DETECTED NOT DETECTED Final   Mycoplasma pneumoniae NOT DETECTED NOT DETECTED Final    Comment: Performed at [facility], [address] [place], [place] [address]  Culture, expectorated sputum-assessment     Status: None   Collection Time: 06/28/17  8:01 AM  Result Value Ref Range Status   Specimen Description SPUTUM  Final   Special Requests NONE  Final   Sputum evaluation   Final    Sputum specimen not acceptable for testing.  Please recollect.   Gram Stain Report Called to,Read Back By and Verified With: C DILLARD,RN AT 0945 06/28/17 BY L BENFIELD Performed at [facility], [address], [place], [place] [address]    Report Status 06/28/2017 FINAL  Final    Radiology  Reports Dg Chest 2 View  Result Date: 06/09/2017 CLINICAL DATA:  73 year old female with panic episodes and hallucination. EXAM: CHEST - 2 VIEW COMPARISON:  Chest CT dated 05/01/2017 FINDINGS: The lungs are clear. There is no pleural effusion or pneumothorax. Top-normal cardiac silhouette. A loop recorder device is noted. No acute osseous pathology. IMPRESSION: No active cardiopulmonary disease. Electronically Signed   By: Elgie Collard M.D.   On: 06/09/2017 03:59   Dg Ankle Complete Right  Result Date: 06/28/2017 CLINICAL DATA:  Fall, twisted ankle. EXAM: RIGHT ANKLE - COMPLETE 3+ VIEW COMPARISON:  04/10/2016 FINDINGS: Posterior and plantar calcaneal spurs. Rowe avulsed fragment off the anterior distal talus. No tibial or fibular abnormality. Joint space is maintained. IMPRESSION: Rowe avulsed fragment off the anterior distal talus. Electronically Signed   By: Charlett Nose M.D.   On: 06/28/2017 09:59   Ct Head Wo Contrast  Result Date: 06/09/2017 CLINICAL DATA:  72 year old female with altered mental status. EXAM: CT HEAD WITHOUT CONTRAST TECHNIQUE: Contiguous axial images were obtained from the base of the skull through the vertex without intravenous contrast. COMPARISON:  Head CT dated 03/23/2016 FINDINGS: Evaluation of this exam is limited due to motion artifact. Brain: There is mild age-related atrophy and chronic microvascular ischemic changes. No acute intracranial hemorrhage. No mass effect or midline shift. No extra-axial fluid collection. Vascular: No hyperdense vessel or unexpected calcification. Skull: Normal. Negative for fracture or focal lesion. Sinuses/Orbits: No acute finding. Other: None IMPRESSION: 1. No acute intracranial hemorrhage. 2. Mild age-related atrophy and chronic microvascular ischemic changes. Electronically Signed   By: Elgie Collard M.D.   On: 06/09/2017 04:11   Ct Angio Chest Pe W And/or Wo Contrast  Result Date: 06/27/2017 CLINICAL DATA:  Worsening shortness of  breath and cough EXAM: CT ANGIOGRAPHY CHEST WITH CONTRAST TECHNIQUE: Multidetector CT imaging of the chest was performed using the standard protocol during  bolus administration of intravenous contrast. Multiplanar CT image reconstructions and MIPs were obtained to evaluate the vascular anatomy. CONTRAST:  ISOVUE-370 IOPAMIDOL (ISOVUE-370) INJECTION 76% COMPARISON:  Chest x-ray 06/26/2016, CT chest 05/01/2017, 09/25/2016 FINDINGS: Cardiovascular: Satisfactory opacification of the pulmonary arteries to the segmental level. No evidence of pulmonary embolism. Normal heart size. No pericardial effusion. 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 No dissection. Coronary vascular calcification. Mild aortic atherosclerosis. Mediastinum/Nodes: Midline trachea. No thyroid mass. No significant adenopathy. Esophagus within normal limits Lungs/Pleura: Atelectasis in the right upper lobe and lingula. No acute infiltrate or effusion. Upper Abdomen: Surgical clips in the left upper quadrant. No acute abnormality. Musculoskeletal: Degenerative changes of the spine. No acute or suspicious abnormality Review of the MIP images confirms the above findings. IMPRESSION: 1. Negative for acute pulmonary embolus or aortic dissection 2. 4.2 cm ascending aortic aneurysm, no change. Recommend annual imaging followup by CTA or MRA. This recommendation follows 2010 ACCF/AHA/AATS/ACR/ASA/SCA/SCAI/SIR/STS/SVM Guidelines for the Diagnosis and Management of Patients with Thoracic Aortic Disease. Circulation. 2010; 121: Z610-R604 3. No focal pulmonary infiltrate Aortic Atherosclerosis (ICD10-I70.0). Electronically Signed   By: [name] Pang M.D.   On: 06/27/2017 00:12   Dg Chest Port 1 View  Result Date: 06/26/2017 CLINICAL DATA:  Shortness of breath and productive cough for 1 day. EXAM: PORTABLE CHEST 1 VIEW COMPARISON:  06/09/2017 FINDINGS: The cardiac silhouette is upper limits of normal in size, unchanged. The lungs are  mildly hypoinflated with similar appearance of mild right hemidiaphragm elevation. No airspace consolidation, edema, sizable pleural effusion, or pneumothorax is identified. A loop recorder and left upper quadrant abdominal surgical clips are again noted. No acute osseous abnormality is seen. IMPRESSION: No active disease. Electronically Signed   By: Sebastian Ache M.D.   On: 06/26/2017 15:43    Lab Data:  CBC: Recent Labs  Lab 06/26/17 1633 06/27/17 0404 06/28/17 0257  WBC 9.1 9.0 7.6  NEUTROABS 6.6  --   --   HGB 12.5 11.8* 12.4  HCT 40.8 38.3 40.9  MCV 95.3 94.3 96.7  PLT 240 235 229   Basic Metabolic Panel: Recent Labs  Lab 06/26/17 1633 06/27/17 0404 06/28/17 0257  NA 139 140 144  K 3.8 3.8 3.5  CL 90* 89* 93*  CO2 35* 39* 39*  GLUCOSE 84 121* 105*  BUN CREATININE 0.86 0.91 0.91  CALCIUM 9.1 9.2 9.2   GFR: Estimated Creatinine Clearance: 71 mL/min (by C-G formula based on SCr of 0.91 mg/dL). Liver Function Tests: Recent Labs  Lab 06/27/17 0218  AST 28  ALT 14  ALKPHOS 64  BILITOT 0.8  PROT 6.9  ALBUMIN 3.9   No results for input(s): LIPASE, AMYLASE in the last 168 hours. Recent Labs  Lab 06/27/17 1822  AMMONIA 19   Coagulation Profile: No results for input(s): INR, PROTIME in the last 168 hours. Cardiac Enzymes: Recent Labs  Lab 06/27/17 0754  TROPONINI <0.03   BNP (last 3 results) No results for input(s): PROBNP in the last 8760 hours. HbA1C: No results for input(s): HGBA1C in the last 72 hours. CBG: No results for input(s): GLUCAP in the last 168 hours. Lipid Profile: No results for input(s): CHOL, HDL, LDLCALC, TRIG, CHOLHDL, LDLDIRECT in the last 72 hours. Thyroid Function Tests: Recent Labs    06/27/17 0405  TSH 2.955   Anemia Panel: Recent Labs    06/27/17 0405  VITAMINB12 830  FOLATE 13.5   Urine analysis:    Component  Value Date/Time   COLORURINE YELLOW 06/27/2017 0613   APPEARANCEUR HAZY (A) 06/27/2017 0613    LABSPEC 1.019 06/27/2017 0613   PHURINE 8.0 06/27/2017 0613   GLUCOSEU NEGATIVE 06/27/2017 0613   HGBUR Rowe (A) 06/27/2017 0613   HGBUR trace-lysed 09/23/2009 0000   BILIRUBINUR NEGATIVE 06/27/2017 0613   KETONESUR NEGATIVE 06/27/2017 0613   PROTEINUR NEGATIVE 06/27/2017 0613   UROBILINOGEN 1.0 08/13/2012 1757   NITRITE NEGATIVE 06/27/2017 0613   LEUKOCYTESUR MODERATE (A) 06/27/2017 1610     Rafik Koppel M.D. [facility] 06/28/2017, 1:04 PM  Pager: [phone] Between 7am to 7pm - call Pager - [phone]  After 7pm go to [email or web address] - password [email or web address]  Call night coverage person covering after 7pm

## 2017-06-28 NOTE — Plan of Care (Signed)
Continue current care plan 

## 2017-06-29 DIAGNOSIS — Z6841 Body Mass Index (BMI) 40.0 and over, adult: Secondary | ICD-10-CM | POA: Diagnosis not present

## 2017-06-29 DIAGNOSIS — G9341 Metabolic encephalopathy: Secondary | ICD-10-CM | POA: Diagnosis present

## 2017-06-29 DIAGNOSIS — I5189 Other ill-defined heart diseases: Secondary | ICD-10-CM | POA: Diagnosis not present

## 2017-06-29 DIAGNOSIS — E662 Morbid (severe) obesity with alveolar hypoventilation: Secondary | ICD-10-CM | POA: Diagnosis present

## 2017-06-29 DIAGNOSIS — K219 Gastro-esophageal reflux disease without esophagitis: Secondary | ICD-10-CM | POA: Diagnosis present

## 2017-06-29 DIAGNOSIS — Z79890 Hormone replacement therapy (postmenopausal): Secondary | ICD-10-CM | POA: Diagnosis not present

## 2017-06-29 DIAGNOSIS — N390 Urinary tract infection, site not specified: Secondary | ICD-10-CM | POA: Diagnosis present

## 2017-06-29 DIAGNOSIS — M4800 Spinal stenosis, site unspecified: Secondary | ICD-10-CM | POA: Diagnosis not present

## 2017-06-29 DIAGNOSIS — I712 Thoracic aortic aneurysm, without rupture: Secondary | ICD-10-CM | POA: Diagnosis present

## 2017-06-29 DIAGNOSIS — Z9089 Acquired absence of other organs: Secondary | ICD-10-CM | POA: Diagnosis not present

## 2017-06-29 DIAGNOSIS — R55 Syncope and collapse: Secondary | ICD-10-CM | POA: Diagnosis not present

## 2017-06-29 DIAGNOSIS — I10 Essential (primary) hypertension: Secondary | ICD-10-CM | POA: Diagnosis not present

## 2017-06-29 DIAGNOSIS — E032 Hypothyroidism due to medicaments and other exogenous substances: Secondary | ICD-10-CM | POA: Diagnosis not present

## 2017-06-29 DIAGNOSIS — I5032 Chronic diastolic (congestive) heart failure: Secondary | ICD-10-CM | POA: Diagnosis present

## 2017-06-29 DIAGNOSIS — Z9884 Bariatric surgery status: Secondary | ICD-10-CM | POA: Diagnosis not present

## 2017-06-29 DIAGNOSIS — E039 Hypothyroidism, unspecified: Secondary | ICD-10-CM | POA: Diagnosis present

## 2017-06-29 DIAGNOSIS — Z833 Family history of diabetes mellitus: Secondary | ICD-10-CM | POA: Diagnosis not present

## 2017-06-29 DIAGNOSIS — J9601 Acute respiratory failure with hypoxia: Secondary | ICD-10-CM | POA: Diagnosis not present

## 2017-06-29 DIAGNOSIS — J440 Chronic obstructive pulmonary disease with acute lower respiratory infection: Secondary | ICD-10-CM | POA: Diagnosis present

## 2017-06-29 DIAGNOSIS — Z825 Family history of asthma and other chronic lower respiratory diseases: Secondary | ICD-10-CM | POA: Diagnosis not present

## 2017-06-29 DIAGNOSIS — M1990 Unspecified osteoarthritis, unspecified site: Secondary | ICD-10-CM | POA: Diagnosis present

## 2017-06-29 DIAGNOSIS — F310 Bipolar disorder, current episode hypomanic: Secondary | ICD-10-CM | POA: Diagnosis not present

## 2017-06-29 DIAGNOSIS — R0602 Shortness of breath: Secondary | ICD-10-CM | POA: Diagnosis present

## 2017-06-29 DIAGNOSIS — Z8249 Family history of ischemic heart disease and other diseases of the circulatory system: Secondary | ICD-10-CM | POA: Diagnosis not present

## 2017-06-29 DIAGNOSIS — J9621 Acute and chronic respiratory failure with hypoxia: Secondary | ICD-10-CM | POA: Diagnosis present

## 2017-06-29 DIAGNOSIS — E873 Alkalosis: Secondary | ICD-10-CM | POA: Diagnosis present

## 2017-06-29 DIAGNOSIS — Z9981 Dependence on supplemental oxygen: Secondary | ICD-10-CM | POA: Diagnosis not present

## 2017-06-29 DIAGNOSIS — E785 Hyperlipidemia, unspecified: Secondary | ICD-10-CM | POA: Diagnosis present

## 2017-06-29 DIAGNOSIS — F319 Bipolar disorder, unspecified: Secondary | ICD-10-CM | POA: Diagnosis present

## 2017-06-29 DIAGNOSIS — Z79899 Other long term (current) drug therapy: Secondary | ICD-10-CM | POA: Diagnosis not present

## 2017-06-29 DIAGNOSIS — Z881 Allergy status to other antibiotic agents status: Secondary | ICD-10-CM | POA: Diagnosis not present

## 2017-06-29 DIAGNOSIS — Z9049 Acquired absence of other specified parts of digestive tract: Secondary | ICD-10-CM | POA: Diagnosis not present

## 2017-06-29 LAB — CBC
HCT: 37.9 % (ref 36.0–46.0)
Hemoglobin: 11.7 g/dL — ABNORMAL LOW (ref 12.0–15.0)
MCH: 29.3 pg (ref 26.0–34.0)
MCHC: 30.9 g/dL (ref 30.0–36.0)
MCV: 95 fL (ref 78.0–100.0)
Platelets: 253 10*3/uL (ref 150–400)
RBC: 3.99 MIL/uL (ref 3.87–5.11)
RDW: 12.8 % (ref 11.5–15.5)
WBC: 11.3 10*3/uL — ABNORMAL HIGH (ref 4.0–10.5)

## 2017-06-29 LAB — BASIC METABOLIC PANEL
Anion gap: 11 (ref 5–15)
BUN: 14 mg/dL (ref 6–20)
CO2: 35 mmol/L — ABNORMAL HIGH (ref 22–32)
Calcium: 9.1 mg/dL (ref 8.9–10.3)
Chloride: 93 mmol/L — ABNORMAL LOW (ref 101–111)
Creatinine, Ser: 0.99 mg/dL (ref 0.44–1.00)
GFR calc Af Amer: >60 mL/min (ref >60)
GFR calc non Af Amer: 56 mL/min — ABNORMAL LOW (ref >60)
Glucose, Bld: 190 mg/dL — ABNORMAL HIGH (ref 65–99)
Potassium: 4.2 mmol/L (ref 3.5–5.1)
Sodium: 139 mmol/L (ref 135–145)

## 2017-06-29 MED ORDER — PREDNISONE 20 MG PO TABS: 40.0000 mg | ORAL_TABLET | Freq: Every day | ORAL | 0 refills | Status: DC

## 2017-06-29 MED ORDER — FLUTICASONE PROPIONATE 50 MCG/ACT NA SUSP: 1.0000 | Freq: Every day | NASAL | 2 refills | Status: DC | PRN

## 2017-06-29 MED ORDER — LORATADINE 10 MG PO TABS: 10.0000 mg | ORAL_TABLET | Freq: Every day | ORAL | 1 refills | Status: DC

## 2017-06-29 MED ORDER — BENZONATATE 100 MG PO CAPS: 100.0000 mg | ORAL_CAPSULE | Freq: Three times a day (TID) | ORAL | 0 refills | Status: DC | PRN

## 2017-06-29 MED ORDER — MOMETASONE FURO-FORMOTEROL FUM 100-5 MCG/ACT IN AERO: 2.0000 | INHALATION_SPRAY | Freq: Two times a day (BID) | RESPIRATORY_TRACT | 4 refills | Status: DC

## 2017-06-29 MED ORDER — GUAIFENESIN-DM 100-10 MG/5ML PO SYRP: 5.0000 mL | ORAL_SOLUTION | ORAL | 0 refills | Status: DC | PRN

## 2017-06-29 MED ORDER — IPRATROPIUM-ALBUTEROL 0.5-2.5 (3) MG/3ML IN SOLN
3.0000 mL | Freq: Three times a day (TID) | RESPIRATORY_TRACT | Status: DC
  Administered 2017-06-29 – 2017-06-30 (×4): 3 mL via RESPIRATORY_TRACT
  Filled 2017-06-29 (×5): qty 3

## 2017-06-29 MED ORDER — GUAIFENESIN ER 600 MG PO TB12: 600.0000 mg | ORAL_TABLET | Freq: Two times a day (BID) | ORAL | 0 refills | Status: DC

## 2017-06-29 MED ORDER — METHYLPREDNISOLONE SODIUM SUCC 40 MG IJ SOLR
40.0000 mg | Freq: Two times a day (BID) | INTRAMUSCULAR | Status: DC
  Administered 2017-06-29: 40 mg via INTRAVENOUS
  Filled 2017-06-29: qty 1

## 2017-06-29 MED ORDER — TRAMADOL HCL 50 MG PO TABS: 50.0000 mg | ORAL_TABLET | Freq: Four times a day (QID) | ORAL | 0 refills | Status: DC | PRN

## 2017-06-29 MED ORDER — ALBUTEROL SULFATE HFA 108 (90 BASE) MCG/ACT IN AERS: 2.0000 | INHALATION_SPRAY | Freq: Four times a day (QID) | RESPIRATORY_TRACT | 2 refills | Status: DC | PRN

## 2017-06-29 NOTE — Clinical Social Work Note (Signed)
Clinical Social Work Assessment  Patient Details  Name: Lindsey Rowe MRN: [id number] Date of Birth: 05-01-1945  Date of referral:  06/29/17               Reason for consult:  Facility Placement                Permission sought to share information with:  Family Supports Permission granted to share information::  Yes, Verbal Permission Granted  Name::     Lindsey Rowe  Agency::  [place] [facility]  Relationship::  friend/cargiver  Contact Information:  [phone]  Housing/Transportation Living arrangements for the past 2 months:  Single Family Home Source of Information:  Patient Patient Interpreter Needed:  None Criminal Activity/Legal Involvement Pertinent to Current Situation/Hospitalization:  No - Comment as needed Significant Relationships:  Friend Lives with:  Self Do you feel safe going back to the place where you live?  No Need for family participation in patient care:  No (Coment)  Care giving concerns:  Pt is alert and oriented. Pt reports her friend Lindsey Rowe is her caregiver. Pt states CSW will be unable to get in touch with Lindsey Rowe until Monday as she is on vacation.   Social Worker assessment / plan:  CSW spoke with pt. Pt is agreeable to SNF at d/c. Pt has been to [place] before. Per pt facility told her she could only come there once--therefore the pt does not wish to go to [place] again. Pt would prefer something in [place]. Pt agreeable to [facility] f/o. CSW will follow up with bed offers once available.   Employment status:  Retired Database administrator PT Recommendations:  Skilled Nursing Facility Information / Referral to community resources:  Skilled Nursing Facility  Patient/Family's Response to care:  Pt verbalized understanding of CSW role and expressed appreciation for support. Pt denies any concern regarding pt care at this time.   Patient/Family's Understanding of and Emotional Response to Diagnosis, Current Treatment, and Prognosis:   Pt understanding and realistic regarding physical limitations. Pt understands the need for SNF placement at d/c. Pt agreeable to SNF placement at d/c, at this time. Pt's responses emotionally appropriate during conversation with CSW. Pt denies any concern regarding treatment plan at this time. CSW will continue to provide support and facilitate d/c needs.   Emotional Assessment Appearance:  Appears stated age Attitude/Demeanor/Rapport:  (Patient was appropriate) Affect (typically observed):  Accepting, Appropriate, Calm Orientation:  Oriented to Situation, Oriented to  Time, Oriented to Place, Oriented to Self Alcohol / Substance use:  Not Applicable Psych involvement (Current and /or in the community):  No (Comment)  Discharge Needs  Concerns to be addressed:  Basic Needs, Care Coordination Readmission within the last 30 days:  No Current discharge risk:  Dependent with Mobility Barriers to Discharge:  Continued Medical Work up   [employer], LCSW 06/29/2017, 4:51 PM

## 2017-06-29 NOTE — [place] FL2 (Signed)
[place] MEDICAID FL2 LEVEL OF CARE SCREENING TOOL     IDENTIFICATION  Patient Name: Lindsey Rowe Birthdate: [birth year] Sex: female Admission Date (Current Location): 06/26/2017  [place] and [place] Number:  Haynes Bast [id number] Facility and Address:  [facility], [address], [place], [place] [address]      Provider Number: [id number]  Attending Physician Name and Address:  Cathren Harsh, MD  Relative Name and Phone Number:  [name] (friend/caregiver) [phone]    Current Level of Care: Hospital Recommended Level of Care: Skilled Nursing Facility Prior Approval Number:    Date Approved/Denied:   PASRR Number: [id number]  Discharge Plan: SNF    Current Diagnoses: Patient Active Problem List   Diagnosis Date Noted  . Acute respiratory failure with hypoxia (HCC) 06/27/2017  . Diastolic dysfunction 06/27/2017  . Acute lower UTI 06/27/2017  . Acute metabolic encephalopathy 06/27/2017  . Altered mental state 06/10/2017  . Dyspnea and respiratory abnormalities 08/29/2016  . Hypoxemia 08/29/2016  . Shoulder dislocation, right, initial encounter 05/24/2016  . 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 Hyperglycemia 05/24/2016  . Anterior dislocation of right shoulder   . Syncope 03/24/2016  . Generalized weakness 09/24/2015  . Leg pain, bilateral 09/24/2015  . Depression 09/24/2015  . Loss of appetite 09/24/2015  . Leg weakness, bilateral   . Hypothyroid 12/28/2012  . Hypovolemia 12/28/2012  . Hypotension 08/13/2012  . Dehydration 08/13/2012  . AKI (acute kidney injury) (HCC) 08/13/2012  . Hypoventilation associated with obesity syndrome (HCC) 08/13/2012  . Acute respiratory failure (HCC) 07/26/2012  . Chest pain 07/26/2012  . ANKLE SPRAIN, RIGHT 03/16/2010  . DIVERTICULOSIS, COLON 10/31/2009  . SKIN LESION 09/23/2009  . Shoulder pain, right 07/05/2009  . COUGH 03/29/2009  . Migraine 03/11/2009  . OTITIS MEDIA, RIGHT  12/13/2008  . BRUISE 12/06/2008  . ACID REFLUX DISEASE 09/13/2008  . ALLERGIC RHINITIS 05/20/2008  . UNSPECIFIED DISEASE OF HAIR AND HAIR FOLLICLES 05/20/2008  . DERMATITIS 11/12/2007  . OBESITY 07/18/2007  . HYPERCHOLESTEROLEMIA 05/13/2007  . BIPOLAR AFFECTIVE DISORDER 05/12/2007  . HYPERTENSION, BENIGN ESSENTIAL 05/12/2007  . Low back pain potentially associated with radiculopathy 05/12/2007    Orientation RESPIRATION BLADDER Height & Weight     Self, Time, Situation, Place  Normal Continent Weight: 256 lb 6.4 oz (116.3 kg) Height:   (160 cm)  BEHAVIORAL SYMPTOMS/MOOD NEUROLOGICAL BOWEL NUTRITION STATUS      Continent Diet(Heart healthy, thin liquids)  AMBULATORY STATUS COMMUNICATION OF NEEDS Skin   Extensive Assist Verbally Normal                       Personal Care Assistance Level of Assistance  Bathing, Feeding, Dressing Bathing Assistance: Maximum assistance Feeding assistance: Independent Dressing Assistance: Maximum assistance     Functional Limitations Info  Sight, Hearing, Speech Sight Info: Adequate Hearing Info: Adequate Speech Info: Adequate    SPECIAL CARE FACTORS FREQUENCY  PT (By licensed PT), OT (By licensed OT)     PT Frequency: 3x OT Frequency: 3x            Contractures Contractures Info: Not present    Additional Factors Info  Code Status, Allergies Code Status Info: Full COde Allergies Info: Gabapentin, Cefadroxil           Current Medications (06/29/2017):  This is the current hospital active medication list Current Facility-Administered Medications  Medication Dose Route Frequency Provider Last Rate Last Dose  . acetaminophen (TYLENOL) tablet 650 mg  650 mg Oral Q6H PRN Eduard Clos, MD   650 mg at 06/28/17 1726   Or  . acetaminophen (TYLENOL) suppository 650 mg  650 mg Rectal Q6H PRN Eduard Clos, MD      . benzonatate (TESSALON) capsule 100 mg  100 mg Oral TID Rai, Ripudeep K, MD   100 mg at 06/29/17  1644  . cefTRIAXone (ROCEPHIN) 1 g in sodium chloride 0.9 % 100 mL IVPB  1 g Intravenous Q24H Rai, Delene Ruffini, MD   Stopped at 06/29/17 [id number]  . DULoxetine (CYMBALTA) DR capsule 30 mg  30 mg Oral BID Eduard Clos, MD   30 mg at 06/29/17 0956  . enoxaparin (LOVENOX) injection 60 mg  60 mg Subcutaneous Q24H Eduard Clos, MD   60 mg at 06/29/17 1344  . fluticasone (FLONASE) 50 MCG/ACT nasal spray 1 spray  1 spray Each Nare Daily Rai, Ripudeep K, MD   1 spray at 06/28/17 0937  . guaiFENesin (MUCINEX) 12 hr tablet 600 mg  600 mg Oral BID Rai, Ripudeep K, MD   600 mg at 06/29/17 0957  . guaiFENesin-dextromethorphan (ROBITUSSIN DM) 100-10 MG/5ML syrup 5 mL  5 mL Oral Q4H PRN Rai, Ripudeep K, MD      . hydrALAZINE (APRESOLINE) injection 10 mg  10 mg Intravenous Q4H PRN Eduard Clos, MD      . ipratropium-albuterol (DUONEB) 0.5-2.5 (3) MG/3ML nebulizer solution 3 mL  3 mL Nebulization TID Rai, Ripudeep K, MD   3 mL at 06/29/17 1452  . levothyroxine (SYNTHROID, LEVOTHROID) tablet 75 mcg  75 mcg Oral QAC breakfast Eduard Clos, MD   75 mcg at 06/29/17 1191  . lisinopril (PRINIVIL,ZESTRIL) tablet 10 mg  10 mg Oral Daily Eduard Clos, MD   10 mg at 06/29/17 0957  . loratadine (CLARITIN) tablet 10 mg  10 mg Oral Daily Rai, Ripudeep K, MD   10 mg at 06/29/17 0957  . MEDLINE mouth rinse  15 mL Mouth Rinse BID Rai, Ripudeep K, MD   15 mL at 06/28/17 2226  . methylPREDNISolone sodium succinate (SOLU-MEDROL) 40 mg/mL injection 40 mg  40 mg Intravenous Q12H Rai, Ripudeep K, MD      . metoprolol succinate (TOPROL-XL) 24 hr tablet 50 mg  50 mg Oral Daily Eduard Clos, MD   50 mg at 06/29/17 0957  . mometasone-formoterol (DULERA) 100-5 MCG/ACT inhaler 2 puff  2 puff Inhalation BID Rai, Ripudeep K, MD   2 puff at 06/29/17 0847  . ondansetron (ZOFRAN) tablet 4 mg  4 mg Oral Q6H PRN Eduard Clos, MD       Or  . ondansetron [facility]) injection 4 mg  4 mg Intravenous Q6H  PRN Eduard Clos, MD      . pantoprazole (PROTONIX) EC tablet 40 mg  40 mg Oral Daily Eduard Clos, MD   40 mg at 06/29/17 0957  . pravastatin (PRAVACHOL) tablet 20 mg  20 mg Oral QHS Eduard Clos, MD   20 mg at 06/28/17 2207  . pregabalin (LYRICA) capsule 100 mg  100 mg Oral BID Eduard Clos, MD   100 mg at 06/29/17 0957  . traMADol (ULTRAM) tablet 50 mg  50 mg Oral Q6H PRN Eduard Clos, MD      . traZODone (DESYREL) tablet 300 mg  300 mg Oral QHS Eduard Clos, MD   300 mg at 06/28/17 2212  . vitamin B-12 (CYANOCOBALAMIN) tablet 1,000 mcg  1,000 mcg Oral Daily Eduard Clos, MD   1,000 mcg at 06/29/17 1610     Discharge Medications: Please see discharge summary for a list of discharge medications.  Relevant Imaging Results:  Relevant Lab Results:   Additional Information SS#: [id number]  Droplet and contact precautions  Jesly Hartmann A Franz Svec, LCSW

## 2017-06-29 NOTE — Progress Notes (Signed)
[facility]                                                                              Patient Demographics  Lindsey Rowe, is a 72 y.o. female, DOB - 02-Jul-1945, JOA:[id number]  Admit date - 06/26/2017   Admitting Physician Eduard Clos, MD  Outpatient Primary MD for the patient is Maurice Small, MD  Outpatient specialists:   LOS - 0  days   Medical records reviewed and are as summarized below:    Chief Complaint  Patient presents with  . Shortness of Breath       Brief summary   Patient is a 72 year old female with history of hypertension, diastolic dysfunction, hypothyroidism, bipolar disorder, hyperlipidemia presented to ED with shortness of breath, nonproductive cough for last 3 weeks.  Shortness of breath worse on exertion, no fevers or chills or chest pain.  In ED, was found to be hypoxic, required 4 L O2.  CTA negative for PE or pneumonia.  Mildly hypoxic and lab work showed metabolic alkalosis, salicylate levels negative.  Patient was confused and anxious in the ED, admitted for further work-up.   Assessment & Plan    Principal Problem:   Acute respiratory failure with hypoxia (HCC) likely due to acute bronchitis, underlying COPD? -possibly due to diastolic dysfunction, acute bronchitis, still has some wheezing, obesity hypoventilation -BNP 39.8, troponins negative -wheezing better, continue scheduled duo nebs - taper solumedrol to  IV q12 - continue Dulera BID, needs outpatient PFTs. - echo showed EF 60-65% with grade 1 diastolic dysfunction    Active Problems:   OBESITY -BMI 47.3, patient counseled on diet and weight control  Acute metabolic encephalopathy in the setting of bipolar disorder. /UTI  -Mental status clear, at baseline  - UA not very consistent with UTI, urine culture showed multiple species, for now placed on IV Rocephin for 3 days total -Continue Cymbalta, trazodone,  Recent fall, right ankle pain -Patient  reports fall a week ago, right ankle x-ray obtained shows small avulsed fragment of the anterior distal talus -Discussed with orthopedics, recommended cam walker boot right foot, weightbearing as tolerated, follow-up with Dr. [name] in 1 week after discharge - PT eval asap today     HYPERTENSION, BENIGN ESSENTIAL -Currently stable, continue Toprol-XL    Hypothyroid -TSH 2.9, continue Synthroid  Chronic diastolic dysfunction - Follow 2D echo, currently compensated -Prior echo 03/2016 showed EF of 60 to 65% with grade 2 diastolic dysfunction.,  Not on Lasix  Ascending aortic aneurysm -Seen on CT chest, 4.2 cm ascending aortic aneurysm, no change, recommended interval imaging follow-up by CTA or MRA  Code Status: Full CODE STATUS DVT Prophylaxis:  Lovenox Family Communication: Discussed in detail with the patient, all imaging results, lab results explained to the patient    Disposition Plan: pending PT eval   Time Spent in minutes 25 minutes  Procedures:  CTA chest Echo : EF 60-65 %,   Consultants:   None   Antimicrobials:   IV Rocephin 5/23   Medications  Scheduled Meds: . benzonatate  100 mg Oral TID  . DULoxetine  30 mg Oral BID  .  enoxaparin (LOVENOX) injection  60 mg Subcutaneous Q24H  . fluticasone  1 spray Each Nare Daily  . guaiFENesin  600 mg Oral BID  . ipratropium-albuterol  3 mL Nebulization TID  . levothyroxine  75 mcg Oral QAC breakfast  . lisinopril  10 mg Oral Daily  . loratadine  10 mg Oral Daily  . mouth rinse  15 mL Mouth Rinse BID  . methylPREDNISolone (SOLU-MEDROL) injection  60 mg Intravenous Q12H  . metoprolol succinate  50 mg Oral Daily  . mometasone-formoterol  2 puff Inhalation BID  . pantoprazole  40 mg Oral Daily  . pravastatin  20 mg Oral QHS  . pregabalin  100 mg Oral BID  . traZODone  300 mg Oral QHS  . vitamin B-12  1,000 mcg Oral Daily   Continuous Infusions: . cefTRIAXone (ROCEPHIN)  IV Stopped (06/29/17 0833)   PRN  Meds:.acetaminophen **OR** acetaminophen, guaiFENesin-dextromethorphan, hydrALAZINE, ondansetron **OR** ondansetron (ZOFRAN) IV, traMADol   Antibiotics   Anti-infectives (From admission, onward)   Start     Dose/Rate Route Frequency Ordered Stop   06/27/17 0800  cefTRIAXone (ROCEPHIN) 1 g in sodium chloride 0.9 % 100 mL IVPB     1 g 200 mL/hr over 30 Minutes Intravenous Every 24 hours 06/27/17 0725          Subjective:   Lindsey Rowe was seen and examined today.  Feels a lot better today with breathing but did not sleep well last night due to "noises". Oriented x 3.  Patient denies dizziness, chest pain, abdominal pain, N/V/D/C, new weakness, numbess, tingling.  Objective:   Vitals:   06/29/17 0801 06/29/17 0847 06/29/17 0956 06/29/17 1114  BP: (!) 141/82   (!) 107/55  Pulse: 73  92 85  Resp: 18   20  Temp: 97.9 F (36.6 C)   97.8 F (36.6 C)  TempSrc: Oral   Oral  SpO2: 96% 99%  96%  Weight:      Height:        Intake/Output Summary (Last 24 hours) at 06/29/2017 1310 Last data filed at 06/29/2017 0657 Gross per 24 hour  Intake 300 ml  Output 2350 ml  Net -2050 ml     Wt Readings from Last 3 Encounters:  06/29/17 116.3 kg (256 lb 6.4 oz)  06/09/17 124.6 kg (274 lb 11.1 oz)  05/20/17 121.1 kg (267 lb)     Exam   Physical Exam  General: Alert and oriented x 3, NAD  Eyes:   HEENT:    Cardiovascular: S1 S2 auscultated, no rubs, murmurs or gallops. Regular rate and rhythm. No pedal edema b/l  Respiratory:fairly CTAB   Gastrointestinal: Soft, nontender, nondistended, + bowel sounds  Ext: no pedal edema bilaterally  Neuro: no new deficits   Musculoskeletal: No digital cyanosis, clubbing  Skin: No rashes  Psych: Normal affect and demeanor, alert and oriented x3     Data Reviewed:  I have personally reviewed following labs and imaging studies  Micro Results Recent Results (from the past 240 hour(s))  Urine culture     Status: Abnormal    Collection Time: 06/27/17  6:13 AM  Result Value Ref Range Status   Specimen Description URINE, CLEAN CATCH  Final   Special Requests   Final    NONE Performed at [facility], [address] [place], [place] [address]    Culture MULTIPLE SPECIES PRESENT, SUGGEST RECOLLECTION (A)  Final   Report Status 06/28/2017 FINAL  Final  Respiratory Panel by  PCR     Status: Abnormal   Collection Time: 06/27/17  6:20 AM  Result Value Ref Range Status   Adenovirus DETECTED (A) NOT DETECTED Final   Coronavirus 229E NOT DETECTED NOT DETECTED Final   Coronavirus HKU1 NOT DETECTED NOT DETECTED Final   Coronavirus NL63 NOT DETECTED NOT DETECTED Final   Coronavirus OC43 NOT DETECTED NOT DETECTED Final   Metapneumovirus NOT DETECTED NOT DETECTED Final   Rhinovirus / Enterovirus NOT DETECTED NOT DETECTED Final   Influenza A NOT DETECTED NOT DETECTED Final   Influenza B NOT DETECTED NOT DETECTED Final   Parainfluenza Virus 1 NOT DETECTED NOT DETECTED Final   Parainfluenza Virus 2 NOT DETECTED NOT DETECTED Final   Parainfluenza Virus 3 NOT DETECTED NOT DETECTED Final   Parainfluenza Virus 4 NOT DETECTED NOT DETECTED Final   Respiratory Syncytial Virus NOT DETECTED NOT DETECTED Final   Bordetella pertussis NOT DETECTED NOT DETECTED Final   Chlamydophila pneumoniae NOT DETECTED NOT DETECTED Final   Mycoplasma pneumoniae NOT DETECTED NOT DETECTED Final    Comment: Performed at [facility], [address] [place], [place] [address]  Culture, expectorated sputum-assessment     Status: None   Collection Time: 06/28/17  8:01 AM  Result Value Ref Range Status   Specimen Description SPUTUM  Final   Special Requests NONE  Final   Sputum evaluation   Final    Sputum specimen not acceptable for testing.  Please recollect.   Gram Stain Report Called to,Read Back By and Verified With: C DILLARD,RN AT 0945 06/28/17 BY L BENFIELD Performed at [facility], [address], [place], [place]  [address]    Report Status 06/28/2017 FINAL  Final    Radiology Reports Dg Chest 2 View  Result Date: 06/09/2017 CLINICAL DATA:  72 year old female with panic episodes and hallucination. EXAM: CHEST - 2 VIEW COMPARISON:  Chest CT dated 05/01/2017 FINDINGS: The lungs are clear. There is no pleural effusion or pneumothorax. Top-normal cardiac silhouette. A loop recorder device is noted. No acute osseous pathology. IMPRESSION: No active cardiopulmonary disease. Electronically Signed   By: Elgie Collard M.D.   On: 06/09/2017 03:59   Dg Ankle Complete Right  Result Date: 06/28/2017 CLINICAL DATA:  Fall, twisted ankle. EXAM: RIGHT ANKLE - COMPLETE 3+ VIEW COMPARISON:  04/10/2016 FINDINGS: Posterior and plantar calcaneal spurs. Small avulsed fragment off the anterior distal talus. No tibial or fibular abnormality. Joint space is maintained. IMPRESSION: Small avulsed fragment off the anterior distal talus. Electronically Signed   By: Charlett Nose M.D.   On: 06/28/2017 09:59   Ct Head Wo Contrast  Result Date: 06/09/2017 CLINICAL DATA:  72 year old female with altered mental status. EXAM: CT HEAD WITHOUT CONTRAST TECHNIQUE: Contiguous axial images were obtained from the base of the skull through the vertex without intravenous contrast. COMPARISON:  Head CT dated 03/23/2016 FINDINGS: Evaluation of this exam is limited due to motion artifact. Brain: There is mild age-related atrophy and chronic microvascular ischemic changes. No acute intracranial hemorrhage. No mass effect or midline shift. No extra-axial fluid collection. Vascular: No hyperdense vessel or unexpected calcification. Skull: Normal. Negative for fracture or focal lesion. Sinuses/Orbits: No acute finding. Other: None IMPRESSION: 1. No acute intracranial hemorrhage. 2. Mild age-related atrophy and chronic microvascular ischemic changes. Electronically Signed   By: Elgie Collard M.D.   On: 06/09/2017 04:11   Ct Angio Chest Pe W And/or Wo  Contrast  Result Date: 06/27/2017 CLINICAL DATA:  Worsening shortness of  breath and cough EXAM: CT ANGIOGRAPHY CHEST WITH CONTRAST TECHNIQUE: Multidetector CT imaging of the chest was performed using the standard protocol during bolus administration of intravenous contrast. Multiplanar CT image reconstructions and MIPs were obtained to evaluate the vascular anatomy. CONTRAST:  ISOVUE-370 IOPAMIDOL (ISOVUE-370) INJECTION 76% COMPARISON:  Chest x-ray 06/26/2016, CT chest 05/01/2017, 09/25/2016 FINDINGS: Cardiovascular: Satisfactory opacification of the pulmonary arteries to the segmental level. No evidence of pulmonary embolism. Normal heart size. No pericardial effusion. 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 No dissection. Coronary vascular calcification. Mild aortic atherosclerosis. Mediastinum/Nodes: Midline trachea. No thyroid mass. No significant adenopathy. Esophagus within normal limits Lungs/Pleura: Atelectasis in the right upper lobe and lingula. No acute infiltrate or effusion. Upper Abdomen: Surgical clips in the left upper quadrant. No acute abnormality. Musculoskeletal: Degenerative changes of the spine. No acute or suspicious abnormality Review of the MIP images confirms the above findings. IMPRESSION: 1. Negative for acute pulmonary embolus or aortic dissection 2. 4.2 cm ascending aortic aneurysm, no change. Recommend annual imaging followup by CTA or MRA. This recommendation follows 2010 ACCF/AHA/AATS/ACR/ASA/SCA/SCAI/SIR/STS/SVM Guidelines for the Diagnosis and Management of Patients with Thoracic Aortic Disease. Circulation. 2010; 121: Z610-R604 3. No focal pulmonary infiltrate Aortic Atherosclerosis (ICD10-I70.0). Electronically Signed   By: [name] Pang M.D.   On: 06/27/2017 00:12   Dg Chest Port 1 View  Result Date: 06/26/2017 CLINICAL DATA:  Shortness of breath and productive cough for 1 day. EXAM: PORTABLE CHEST 1 VIEW COMPARISON:  06/09/2017 FINDINGS: The  cardiac silhouette is upper limits of normal in size, unchanged. The lungs are mildly hypoinflated with similar appearance of mild right hemidiaphragm elevation. No airspace consolidation, edema, sizable pleural effusion, or pneumothorax is identified. A loop recorder and left upper quadrant abdominal surgical clips are again noted. No acute osseous abnormality is seen. IMPRESSION: No active disease. Electronically Signed   By: Sebastian Ache M.D.   On: 06/26/2017 15:43    Lab Data:  CBC: Recent Labs  Lab 06/26/17 1633 06/27/17 0404 06/28/17 0257 06/29/17 0258  WBC 9.1 9.0 7.6 11.3*  NEUTROABS 6.6  --   --   --   HGB 12.5 11.8* 12.4 11.7*  HCT 40.8 38.3 40.9 37.9  MCV 95.3 94.3 96.7 95.0  PLT 240 235 229 253   Basic Metabolic Panel: Recent Labs  Lab 06/26/17 1633 06/27/17 0404 06/28/17 0257 06/29/17 0258  NA 139 140 144 139  K 3.8 3.8 3.5 4.2  CL 90* 89* 93* 93*  CO2 35* 39* 39* 35*  GLUCOSE 84 121* 105* 190*  BUN CREATININE 0.86 0.91 0.91 0.99  CALCIUM 9.1 9.2 9.2 9.1   GFR: Estimated Creatinine Clearance: 65.1 mL/min (by C-G formula based on SCr of 0.99 mg/dL). Liver Function Tests: Recent Labs  Lab 06/27/17 0218  AST 28  ALT 14  ALKPHOS 64  BILITOT 0.8  PROT 6.9  ALBUMIN 3.9   No results for input(s): LIPASE, AMYLASE in the last 168 hours. Recent Labs  Lab 06/27/17 1822  AMMONIA 19   Coagulation Profile: No results for input(s): INR, PROTIME in the last 168 hours. Cardiac Enzymes: Recent Labs  Lab 06/27/17 0754  TROPONINI <0.03   BNP (last 3 results) No results for input(s): PROBNP in the last 8760 hours. HbA1C: No results for input(s): HGBA1C in the last 72 hours. CBG: No results for input(s): GLUCAP in the last 168 hours. Lipid Profile: No results for input(s): CHOL, HDL, LDLCALC, TRIG, CHOLHDL, LDLDIRECT  in the last 72 hours. Thyroid Function Tests: Recent Labs    06/27/17 0405  TSH 2.955   Anemia Panel: Recent Labs     06/27/17 0405  VITAMINB12 830  FOLATE 13.5   Urine analysis:    Component Value Date/Time   COLORURINE YELLOW 06/27/2017 0613   APPEARANCEUR HAZY (A) 06/27/2017 0613   LABSPEC 1.019 06/27/2017 0613   PHURINE 8.0 06/27/2017 0613   GLUCOSEU NEGATIVE 06/27/2017 0613   HGBUR SMALL (A) 06/27/2017 0613   HGBUR trace-lysed 09/23/2009 0000   BILIRUBINUR NEGATIVE 06/27/2017 0613   KETONESUR NEGATIVE 06/27/2017 0613   PROTEINUR NEGATIVE 06/27/2017 0613   UROBILINOGEN 1.0 08/13/2012 1757   NITRITE NEGATIVE 06/27/2017 0613   LEUKOCYTESUR MODERATE (A) 06/27/2017 1610     Jonathin Heinicke M.D. [facility] 06/29/2017, 1:10 PM  Pager: [phone] Between 7am to 7pm - call Pager - [phone]  After 7pm go to [email or web address] - password [email or web address]  Call night coverage person covering after 7pm

## 2017-06-30 DIAGNOSIS — M4800 Spinal stenosis, site unspecified: Secondary | ICD-10-CM | POA: Diagnosis not present

## 2017-06-30 LAB — BASIC METABOLIC PANEL
Anion gap: 10 (ref 5–15)
BUN: 18 mg/dL (ref 6–20)
CO2: 36 mmol/L — ABNORMAL HIGH (ref 22–32)
Calcium: 9 mg/dL (ref 8.9–10.3)
Chloride: 93 mmol/L — ABNORMAL LOW (ref 101–111)
Creatinine, Ser: 1.04 mg/dL — ABNORMAL HIGH (ref 0.44–1.00)
GFR calc Af Amer: >60 mL/min (ref >60)
GFR calc non Af Amer: 53 mL/min — ABNORMAL LOW (ref >60)
Glucose, Bld: 114 mg/dL — ABNORMAL HIGH (ref 65–99)
Potassium: 4.5 mmol/L (ref 3.5–5.1)
Sodium: 139 mmol/L (ref 135–145)

## 2017-06-30 LAB — CBC
HCT: 39.2 % (ref 36.0–46.0)
Hemoglobin: 12.1 g/dL (ref 12.0–15.0)
MCH: 29.1 pg (ref 26.0–34.0)
MCHC: 30.9 g/dL (ref 30.0–36.0)
MCV: 94.2 fL (ref 78.0–100.0)
Platelets: 271 10*3/uL (ref 150–400)
RBC: 4.16 MIL/uL (ref 3.87–5.11)
RDW: 13.2 % (ref 11.5–15.5)
WBC: 14.6 10*3/uL — ABNORMAL HIGH (ref 4.0–10.5)

## 2017-06-30 MED ORDER — IPRATROPIUM-ALBUTEROL 0.5-2.5 (3) MG/3ML IN SOLN: 3.0000 mL | Freq: Four times a day (QID) | RESPIRATORY_TRACT | Status: DC | PRN

## 2017-06-30 MED ORDER — LOPERAMIDE HCL 2 MG PO CAPS: 2.0000 mg | ORAL_CAPSULE | Freq: Three times a day (TID) | ORAL | Status: DC | PRN

## 2017-06-30 MED ORDER — PREDNISONE 20 MG PO TABS
40.0000 mg | ORAL_TABLET | Freq: Every day | ORAL | Status: DC
  Administered 2017-06-30 – 2017-07-03 (×4): 40 mg via ORAL
  Filled 2017-06-30 (×4): qty 2

## 2017-06-30 NOTE — Progress Notes (Signed)
[facility]                                                                              Patient Demographics  Lindsey Rowe, is a 72 y.o. female, DOB - 1945/08/24, ZOX:[id number]  Admit date - 06/26/2017   Admitting Physician Eduard Clos, MD  Outpatient Primary MD for the patient is Maurice Small, MD  Outpatient specialists:   LOS - 1  days   Medical records reviewed and are as summarized below:    Chief Complaint  Patient presents with  . Shortness of Breath       Brief summary   Patient is a 72 year old female with history of hypertension, diastolic dysfunction, hypothyroidism, bipolar disorder, hyperlipidemia presented to ED with shortness of breath, nonproductive cough for last 3 weeks.  Shortness of breath worse on exertion, no fevers or chills or chest pain.  In ED, was found to be hypoxic, required 4 L O2.  CTA negative for PE or pneumonia.  Mildly hypoxic and lab work showed metabolic alkalosis, salicylate levels negative.  Patient was confused and anxious in the ED, admitted for further work-up.   Assessment & Plan    Principal Problem:   Acute respiratory failure with hypoxia (HCC) likely due to acute bronchitis, underlying COPD? -possibly due to diastolic dysfunction, acute bronchitis, still has some wheezing, obesity hypoventilation -BNP 39.8, troponins negative -No wheezing, continue duo nebs, transition to oral prednisone - continue Dulera BID, needs outpatient PFTs. - echo showed EF 60-65% with grade 1 diastolic dysfunction    Active Problems:   OBESITY -BMI 47.3, patient counseled on diet and weight control  Acute metabolic encephalopathy in the setting of bipolar disorder. /UTI  -Mental status clear, at baseline  -DC IV Rocephin, received for 3 days total.  Urine culture showed multiple species.   -Continue Cymbalta, trazodone,  Recent fall, right ankle pain -Patient reports fall a week ago, right ankle x-ray obtained  shows small avulsed fragment of the anterior distal talus -Discussed with orthopedics, recommended cam walker boot right foot, weightbearing as tolerated, follow-up with Dr. [name] in 1 week after discharge -PT evaluation recommended skilled nursing facility, social work consult placed    HYPERTENSION, BENIGN ESSENTIAL -Currently stable, continue Toprol-XL    Hypothyroid -TSH 2.9, continue Synthroid  Chronic diastolic dysfunction - Follow 2D echo, currently compensated -Prior echo 03/2016 showed EF of 60 to 65% with grade 2 diastolic dysfunction.,  Not on Lasix  Ascending aortic aneurysm -Seen on CT chest, 4.2 cm ascending aortic aneurysm, no change, recommended interval imaging follow-up by CTA or MRA   ?  Diarrhea Per patient she had diarrhea yesterday, not witnessed by RN.  Requested patient to call if needed. No BM overnight or this morning, Imodium as needed  Code Status: Full CODE STATUS DVT Prophylaxis:  Lovenox Family Communication: Discussed in detail with the patient, all imaging results, lab results explained to the patient    Disposition Plan: SNF when bed available Time Spent in minutes 25 minutes  Procedures:  CTA chest Echo : EF 60-65 %,   Consultants:   None   Antimicrobials:   IV Rocephin  5/23   Medications  Scheduled Meds: . benzonatate  100 mg Oral TID  . DULoxetine  30 mg Oral BID  . enoxaparin (LOVENOX) injection  60 mg Subcutaneous Q24H  . fluticasone  1 spray Each Nare Daily  . guaiFENesin  600 mg Oral BID  . ipratropium-albuterol  3 mL Nebulization TID  . levothyroxine  75 mcg Oral QAC breakfast  . lisinopril  10 mg Oral Daily  . loratadine  10 mg Oral Daily  . mouth rinse  15 mL Mouth Rinse BID  . metoprolol succinate  50 mg Oral Daily  . mometasone-formoterol  2 puff Inhalation BID  . pantoprazole  40 mg Oral Daily  . pravastatin  20 mg Oral QHS  . predniSONE  40 mg Oral Q breakfast  . pregabalin  100 mg Oral BID  . traZODone   300 mg Oral QHS  . vitamin B-12  1,000 mcg Oral Daily   Continuous Infusions:  PRN Meds:.acetaminophen **OR** acetaminophen, guaiFENesin-dextromethorphan, hydrALAZINE, loperamide, ondansetron **OR** ondansetron (ZOFRAN) IV, traMADol   Antibiotics   Anti-infectives (From admission, onward)   Start     Dose/Rate Route Frequency Ordered Stop   06/27/17 0800  cefTRIAXone (ROCEPHIN) 1 g in sodium chloride 0.9 % 100 mL IVPB  Status:  Discontinued     1 g 200 mL/hr over 30 Minutes Intravenous Every 24 hours 06/27/17 0725 06/30/17 0810        Subjective:   Lindsey Rowe was seen and examined today.  States she had diarrhea yesterday, did not tell the RN.  No fevers or chills, no abdominal pain.  No wheezing.  Alert and oriented.  Patient denies dizziness, chest pain, abdominal pain,new weakness, numbess, tingling.  Objective:   Vitals:   06/30/17 0738 06/30/17 0757 06/30/17 0800 06/30/17 1052  BP:  115/66 115/66 110/87  Pulse:  64 68 71  Resp:  18 (!) 22 20  Temp:  97.8 F (36.6 C)  97.7 F (36.5 C)  TempSrc:  Oral  Oral  SpO2: 96% 94% 95% 99%  Weight:      Height:        Intake/Output Summary (Last 24 hours) at 06/30/2017 1216 Last data filed at 06/30/2017 1050 Gross per 24 hour  Intake 360 ml  Output 2301 ml  Net -1941 ml     Wt Readings from Last 3 Encounters:  06/30/17 116.4 kg (256 lb 11.2 oz)  06/09/17 124.6 kg (274 lb 11.1 oz)  05/20/17 121.1 kg (267 lb)     Exam   Physical Exam   general: Alert and oriented x 3, NAD  Eyes:   HEENT:   Cardiovascular: S1 S2 auscultated, Regular rate and rhythm. No pedal edema b/l  Respiratory: Clear to auscultation bilaterally, no wheezing, rales or rhonchi  Gastrointestinal: Soft, nontender, nondistended, + bowel sounds  Ext: no pedal edema bilaterally  Neuro: no new deficits  Musculoskeletal: No digital cyanosis, clubbing  Skin: No rashes  Psych: Normal affect and demeanor, alert and oriented x3      Data Reviewed:  I have personally reviewed following labs and imaging studies  Micro Results Recent Results (from the past 240 hour(s))  Urine culture     Status: Abnormal   Collection Time: 06/27/17  6:13 AM  Result Value Ref Range Status   Specimen Description URINE, CLEAN CATCH  Final   Special Requests   Final    NONE Performed at [facility], [address] [place], [place] [address]  Culture MULTIPLE SPECIES PRESENT, SUGGEST RECOLLECTION (A)  Final   Report Status 06/28/2017 FINAL  Final  Respiratory Panel by PCR     Status: Abnormal   Collection Time: 06/27/17  6:20 AM  Result Value Ref Range Status   Adenovirus DETECTED (A) NOT DETECTED Final   Coronavirus 229E NOT DETECTED NOT DETECTED Final   Coronavirus HKU1 NOT DETECTED NOT DETECTED Final   Coronavirus NL63 NOT DETECTED NOT DETECTED Final   Coronavirus OC43 NOT DETECTED NOT DETECTED Final   Metapneumovirus NOT DETECTED NOT DETECTED Final   Rhinovirus / Enterovirus NOT DETECTED NOT DETECTED Final   Influenza A NOT DETECTED NOT DETECTED Final   Influenza B NOT DETECTED NOT DETECTED Final   Parainfluenza Virus 1 NOT DETECTED NOT DETECTED Final   Parainfluenza Virus 2 NOT DETECTED NOT DETECTED Final   Parainfluenza Virus 3 NOT DETECTED NOT DETECTED Final   Parainfluenza Virus 4 NOT DETECTED NOT DETECTED Final   Respiratory Syncytial Virus NOT DETECTED NOT DETECTED Final   Bordetella pertussis NOT DETECTED NOT DETECTED Final   Chlamydophila pneumoniae NOT DETECTED NOT DETECTED Final   Mycoplasma pneumoniae NOT DETECTED NOT DETECTED Final    Comment: Performed at [facility], [address], [place], [place] [address]  Culture, expectorated sputum-assessment     Status: None   Collection Time: 06/28/17  8:01 AM  Result Value Ref Range Status   Specimen Description SPUTUM  Final   Special Requests NONE  Final   Sputum evaluation   Final    Sputum specimen not acceptable for testing.  Please  recollect.   Gram Stain Report Called to,Read Back By and Verified With: C DILLARD,RN AT 0945 06/28/17 BY L BENFIELD Performed at [facility], [address], [place], [place] [address]    Report Status 06/28/2017 FINAL  Final    Radiology Reports Dg Chest 2 View  Result Date: 06/09/2017 CLINICAL DATA:  72 year old female with panic episodes and hallucination. EXAM: CHEST - 2 VIEW COMPARISON:  Chest CT dated 05/01/2017 FINDINGS: The lungs are clear. There is no pleural effusion or pneumothorax. Top-normal cardiac silhouette. A loop recorder device is noted. No acute osseous pathology. IMPRESSION: No active cardiopulmonary disease. Electronically Signed   By: Elgie Collard M.D.   On: 06/09/2017 03:59   Dg Ankle Complete Right  Result Date: 06/28/2017 CLINICAL DATA:  Fall, twisted ankle. EXAM: RIGHT ANKLE - COMPLETE 3+ VIEW COMPARISON:  04/10/2016 FINDINGS: Posterior and plantar calcaneal spurs. Small avulsed fragment off the anterior distal talus. No tibial or fibular abnormality. Joint space is maintained. IMPRESSION: Small avulsed fragment off the anterior distal talus. Electronically Signed   By: Charlett Nose M.D.   On: 06/28/2017 09:59   Ct Head Wo Contrast  Result Date: 06/09/2017 CLINICAL DATA:  72 year old female with altered mental status. EXAM: CT HEAD WITHOUT CONTRAST TECHNIQUE: Contiguous axial images were obtained from the base of the skull through the vertex without intravenous contrast. COMPARISON:  Head CT dated 03/23/2016 FINDINGS: Evaluation of this exam is limited due to motion artifact. Brain: There is mild age-related atrophy and chronic microvascular ischemic changes. No acute intracranial hemorrhage. No mass effect or midline shift. No extra-axial fluid collection. Vascular: No hyperdense vessel or unexpected calcification. Skull: Normal. Negative for fracture or focal lesion. Sinuses/Orbits: No acute finding. Other: None IMPRESSION: 1. No acute intracranial hemorrhage.  2. Mild age-related atrophy and chronic microvascular ischemic changes. Electronically Signed   By: Elgie Collard M.D.   On: 06/09/2017  04:11   Ct Angio Chest Pe W And/or Wo Contrast  Result Date: 06/27/2017 CLINICAL DATA:  Worsening shortness of breath and cough EXAM: CT ANGIOGRAPHY CHEST WITH CONTRAST TECHNIQUE: Multidetector CT imaging of the chest was performed using the standard protocol during bolus administration of intravenous contrast. Multiplanar CT image reconstructions and MIPs were obtained to evaluate the vascular anatomy. CONTRAST:  ISOVUE-370 IOPAMIDOL (ISOVUE-370) INJECTION 76% COMPARISON:  Chest x-ray 06/26/2016, CT chest 05/01/2017, 09/25/2016 FINDINGS: Cardiovascular: Satisfactory opacification of the pulmonary arteries to the segmental level. No evidence of pulmonary embolism. Normal heart size. No pericardial effusion. 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 No dissection. Coronary vascular calcification. Mild aortic atherosclerosis. Mediastinum/Nodes: Midline trachea. No thyroid mass. No significant adenopathy. Esophagus within normal limits Lungs/Pleura: Atelectasis in the right upper lobe and lingula. No acute infiltrate or effusion. Upper Abdomen: Surgical clips in the left upper quadrant. No acute abnormality. Musculoskeletal: Degenerative changes of the spine. No acute or suspicious abnormality Review of the MIP images confirms the above findings. IMPRESSION: 1. Negative for acute pulmonary embolus or aortic dissection 2. 4.2 cm ascending aortic aneurysm, no change. Recommend annual imaging followup by CTA or MRA. This recommendation follows 2010 ACCF/AHA/AATS/ACR/ASA/SCA/SCAI/SIR/STS/SVM Guidelines for the Diagnosis and Management of Patients with Thoracic Aortic Disease. Circulation. 2010; 121: Z610-R604 3. No focal pulmonary infiltrate Aortic Atherosclerosis (ICD10-I70.0). Electronically Signed   By: [name] Pang M.D.   On: 06/27/2017 00:12   Dg Chest  Port 1 View  Result Date: 06/26/2017 CLINICAL DATA:  Shortness of breath and productive cough for 1 day. EXAM: PORTABLE CHEST 1 VIEW COMPARISON:  06/09/2017 FINDINGS: The cardiac silhouette is upper limits of normal in size, unchanged. The lungs are mildly hypoinflated with similar appearance of mild right hemidiaphragm elevation. No airspace consolidation, edema, sizable pleural effusion, or pneumothorax is identified. A loop recorder and left upper quadrant abdominal surgical clips are again noted. No acute osseous abnormality is seen. IMPRESSION: No active disease. Electronically Signed   By: Sebastian Ache M.D.   On: 06/26/2017 15:43    Lab Data:  CBC: Recent Labs  Lab 06/26/17 1633 06/27/17 0404 06/28/17 0257 06/29/17 0258 06/30/17 0241  WBC 9.1 9.0 7.6 11.3* 14.6*  NEUTROABS 6.6  --   --   --   --   HGB 12.5 11.8* 12.4 11.7* 12.1  HCT 40.8 38.3 40.9 37.9 39.2  MCV 95.3 94.3 96.7 95.0 94.2  PLT 240 235 229 253 271   Basic Metabolic Panel: Recent Labs  Lab 06/26/17 1633 06/27/17 0404 06/28/17 0257 06/29/17 0258 06/30/17 0241  NA 139 140 144 139 139  K 3.8 3.8 3.5 4.2 4.5  CL 90* 89* 93* 93* 93*  CO2 35* 39* 39* 35* 36*  GLUCOSE 84 121* 105* 190* 114*  BUN CREATININE 0.86 0.91 0.91 0.99 1.04*  CALCIUM 9.1 9.2 9.2 9.1 9.0   GFR: Estimated Creatinine Clearance: 62 mL/min (A) (by C-G formula based on SCr of 1.04 mg/dL (H)). Liver Function Tests: Recent Labs  Lab 06/27/17 0218  AST 28  ALT 14  ALKPHOS 64  BILITOT 0.8  PROT 6.9  ALBUMIN 3.9   No results for input(s): LIPASE, AMYLASE in the last 168 hours. Recent Labs  Lab 06/27/17 1822  AMMONIA 19   Coagulation Profile: No results for input(s): INR, PROTIME in the last 168 hours. Cardiac Enzymes: Recent Labs  Lab 06/27/17 0754  TROPONINI <0.03   BNP (last 3 results) No  results for input(s): PROBNP in the last 8760 hours. HbA1C: No results for input(s): HGBA1C in the last 72  hours. CBG: No results for input(s): GLUCAP in the last 168 hours. Lipid Profile: No results for input(s): CHOL, HDL, LDLCALC, TRIG, CHOLHDL, LDLDIRECT in the last 72 hours. Thyroid Function Tests: No results for input(s): TSH, T4TOTAL, FREET4, T3FREE, THYROIDAB in the last 72 hours. Anemia Panel: No results for input(s): VITAMINB12, FOLATE, FERRITIN, TIBC, IRON, RETICCTPCT in the last 72 hours. Urine analysis:    Component Value Date/Time   COLORURINE YELLOW 06/27/2017 0613   APPEARANCEUR HAZY (A) 06/27/2017 0613   LABSPEC 1.019 06/27/2017 0613   PHURINE 8.0 06/27/2017 0613   GLUCOSEU NEGATIVE 06/27/2017 0613   HGBUR SMALL (A) 06/27/2017 0613   HGBUR trace-lysed 09/23/2009 0000   BILIRUBINUR NEGATIVE 06/27/2017 0613   KETONESUR NEGATIVE 06/27/2017 0613   PROTEINUR NEGATIVE 06/27/2017 0613   UROBILINOGEN 1.0 08/13/2012 1757   NITRITE NEGATIVE 06/27/2017 0613   LEUKOCYTESUR MODERATE (A) 06/27/2017 1610     Lindsey Rowe M.D. [facility] 06/30/2017, 12:16 PM  Pager: [phone] Between 7am to 7pm - call Pager - [phone]  After 7pm go to [email or web address] - password [email or web address]  Call night coverage person covering after 7pm

## 2017-06-30 NOTE — Progress Notes (Signed)
PT Note  PT eval completed 06/28/17. Recommendation is for ST-SNF. Social Work working on placement. Will continue PT as outlined in eval.  [facility] PT [phone]

## 2017-06-30 NOTE — Evaluation (Signed)
Occupational Therapy Evaluation Patient Details Name: Lindsey Rowe MRN: [id number] DOB: 13-Aug-1945 Today's Date: 06/30/2017    History of Present Illness 72 y.o. female with history of HTN, diastolic dysfunction, hypothyroidism, bipolar disorder, HLD. Admitted with SOB, UTI, with noted ascending aortic aneurysm   Clinical Impression   PTA, pt was independent with rollator for basic ADL and functional mobility and living alone. She drives and does grocery shopping. She does have a caregiver to assist with some IADL. Pt currently requiring min assist for ambulating toilet transfers with R LE in CAM boot, min guard assist for standing grooming tasks, and mod assist for LB ADL. She presents with back pain, decreased activity tolerance for ADL participation and R foot pain impacting her ability to participate in ADL at [facility]. At current functional level, pt would benefit from SNF level rehabilitation post-acute D/C to maximize safety and independence prior to returning home. OT will continue to follow while admitted.     Follow Up Recommendations  SNF;Supervision/Assistance - 24 hour    Equipment Recommendations  Other (comment)(defer to next venue of care)    Recommendations for Other Services       Precautions / Restrictions Precautions Precautions: None Restrictions Weight Bearing Restrictions: Yes RLE Weight Bearing: Weight bearing as tolerated(CAM boot) Other Position/Activity Restrictions: WBAT RLE in CAM boot      Mobility Bed Mobility Overal bed mobility: Needs Assistance Bed Mobility: Sit to Supine       Sit to supine: Supervision   General bed mobility comments: Supervision for safety  Transfers Overall transfer level: Needs assistance Equipment used: Rolling walker (2 wheeled) Transfers: Sit to/from Stand Sit to Stand: Min assist         General transfer comment: Pt reports that R foot pain is minimal when wearing CAM boot.     Balance Overall balance  assessment: Needs assistance Sitting-balance support: No upper extremity supported Sitting balance-Leahy Scale: Fair     Standing balance support: Bilateral upper extremity supported;No upper extremity supported;During functional activity Standing balance-Leahy Scale: Fair Standing balance comment: Able to statically stand for grooming tasks with supervision. Relies on BUE support during ambulation                           ADL either performed or assessed with clinical judgement   ADL Overall ADL's : Needs assistance/impaired Eating/Feeding: Set up;Sitting   Grooming: Set up;Sitting   Upper Body Bathing: Set up;Sitting   Lower Body Bathing: Moderate assistance;Sit to/from stand   Upper Body Dressing : Set up;Sitting   Lower Body Dressing: Moderate assistance;Sit to/from stand   Toilet Transfer: Minimal assistance;Ambulation;RW Toilet Transfer Details (indicate cue type and reason): simulated with sit<>stannd followed by ambulation in room Toileting- Clothing Manipulation and Hygiene: Minimal assistance;Sit to/from stand       Functional mobility during ADLs: Minimal assistance;Rolling walker General ADL Comments: Pt limited in activity tolerance for ADL participation.     Vision Baseline Vision/History: Wears glasses Patient Visual Report: No change from baseline Vision Assessment?: No apparent visual deficits     Perception     Praxis      Pertinent Vitals/Pain Pain Assessment: Faces Faces Pain Scale: Hurts a little bit Pain Location: low back Pain Descriptors / Indicators: Discomfort Pain Intervention(s): Limited activity within patient's tolerance;Monitored during session     Hand Dominance Right   Extremity/Trunk Assessment Upper Extremity Assessment Upper Extremity Assessment: Overall WFL for tasks assessed   Lower Extremity  Assessment Lower Extremity Assessment: Generalized weakness       Communication Communication Communication: No  difficulties   Cognition Arousal/Alertness: Awake/alert Behavior During Therapy: WFL for tasks assessed/performed Overall Cognitive Status: No family/caregiver present to determine baseline cognitive functioning Area of Impairment: Safety/judgement                         Safety/Judgement: Decreased awareness of safety     General Comments: Pt with decreased awareness of need to modify tasks for safety.   General Comments  Encouraged OOB all meals.     Exercises     Shoulder Instructions      Home Living Family/patient expects to be discharged to:: Private residence Living Arrangements: Alone Available Help at Discharge: Available PRN/intermittently;Friend(s)(caregiver 3-4 hours/day; friend for a few hours) Type of Home: Apartment Home Access: Level entry     Home Layout: One level     Bathroom Shower/Tub: Chief Strategy Officer: Standard Bathroom Accessibility: Yes   Home Equipment: Environmental consultant - 2 wheels;Walker - 4 wheels;Grab bars - toilet(rollator with broken brake)          Prior Functioning/Environment Level of Independence: Independent with assistive device(s)        Comments: Pt reports being able to perform ADLs and go to grocery store. Pt reports she has fallen in the past.        OT Problem List: Decreased strength;Decreased activity tolerance;Impaired balance (sitting and/or standing);Decreased safety awareness;Decreased knowledge of use of DME or AE;Decreased knowledge of precautions;Pain      OT Treatment/Interventions: Self-care/ADL training;Therapeutic exercise;Energy conservation;DME and/or AE instruction;Therapeutic activities;Patient/family education;Balance training    OT Goals(Current goals can be found in the care plan section) Acute Rehab OT Goals Patient Stated Goal: To go home OT Goal Formulation: With patient Time For Goal Achievement: 07/14/17 Potential to Achieve Goals: Good ADL Goals Pt Will Perform Grooming:  with modified independence;standing Pt Will Perform Lower Body Dressing: with supervision;sit to/from stand Pt Will Transfer to Toilet: with supervision;ambulating Pt Will Perform Toileting - Clothing Manipulation and hygiene: with supervision;sit to/from stand  OT Frequency: Min 2X/week   Barriers to D/C:            Co-evaluation              AM-PAC PT "6 Clicks" Daily Activity     Outcome Measure Help from another person eating meals?: None Help from another person taking care of personal grooming?: A Little Help from another person toileting, which includes using toliet, bedpan, or urinal?: A Little Help from another person bathing (including washing, rinsing, drying)?: A Lot Help from another person to put on and taking off regular upper body clothing?: A Little Help from another person to put on and taking off regular lower body clothing?: A Lot 6 Click Score: 17   End of Session Equipment Utilized During Treatment: Rolling walker(CAM boot) Nurse Communication: Mobility status  Activity Tolerance: Patient tolerated treatment well Patient left: in bed;with bed alarm set  OT Visit Diagnosis: Other abnormalities of gait and mobility (R26.89);Muscle weakness (generalized) (M62.81);Pain Pain - Right/Left: Right Pain - part of body: Ankle and joints of foot(and back)                Time: 0960-4540 OT Time Calculation (min): 27 min Charges:  OT General Charges $OT Visit: 1 Visit OT Evaluation $OT Eval Moderate Complexity: 1 Mod OT Treatments $Self Care/Home Management : 8-22 mins G-Codes:  Doristine Section, MS OTR/L  Pager: [phone]   Marlen Koman A Anne-Marie Genson 06/30/2017, 3:01 PM

## 2017-07-01 LAB — BASIC METABOLIC PANEL
Anion gap: 10 (ref 5–15)
BUN: 28 mg/dL — ABNORMAL HIGH (ref 6–20)
CO2: 33 mmol/L — ABNORMAL HIGH (ref 22–32)
Calcium: 8.8 mg/dL — ABNORMAL LOW (ref 8.9–10.3)
Chloride: 95 mmol/L — ABNORMAL LOW (ref 101–111)
Creatinine, Ser: 1.05 mg/dL — ABNORMAL HIGH (ref 0.44–1.00)
GFR calc Af Amer: >60 mL/min (ref >60)
GFR calc non Af Amer: 53 mL/min — ABNORMAL LOW (ref >60)
Glucose, Bld: 100 mg/dL — ABNORMAL HIGH (ref 65–99)
Potassium: 5.3 mmol/L — ABNORMAL HIGH (ref 3.5–5.1)
Sodium: 138 mmol/L (ref 135–145)

## 2017-07-01 LAB — CBC
HCT: 36.5 % (ref 36.0–46.0)
Hemoglobin: 11.3 g/dL — ABNORMAL LOW (ref 12.0–15.0)
MCH: 29.2 pg (ref 26.0–34.0)
MCHC: 31 g/dL (ref 30.0–36.0)
MCV: 94.3 fL (ref 78.0–100.0)
Platelets: 298 10*3/uL (ref 150–400)
RBC: 3.87 MIL/uL (ref 3.87–5.11)
RDW: 13.2 % (ref 11.5–15.5)
WBC: 9.7 10*3/uL (ref 4.0–10.5)

## 2017-07-01 NOTE — Progress Notes (Signed)
[facility]                                                                              Patient Demographics  Lindsey Rowe, is a 72 y.o. female, DOB - 12/08/1945, ONG:[id number]  Admit date - 06/26/2017   Admitting Physician Lindsey Clos, MD  Outpatient Primary MD for the patient is Lindsey Small, MD  Outpatient specialists:   LOS - 2  days   Medical records reviewed and are as summarized below:    Chief Complaint  Patient presents with  . Shortness of Breath       Brief summary   Patient is a 72 year old female with history of hypertension, diastolic dysfunction, hypothyroidism, bipolar disorder, hyperlipidemia presented to ED with shortness of breath, nonproductive cough for last 3 weeks.  Shortness of breath worse on exertion, no fevers or chills or chest pain.  In ED, was found to be hypoxic, required 4 L O2.  CTA negative for PE or pneumonia.  Mildly hypoxic and lab work showed metabolic alkalosis, salicylate levels negative.  Patient was confused and anxious in the ED, admitted for further work-up.   Assessment & Plan    Principal Problem:   Acute respiratory failure with hypoxia (HCC) likely due to acute bronchitis, underlying COPD? -possibly due to diastolic dysfunction, acute bronchitis, still has some wheezing, obesity hypoventilation -BNP 39.8, troponins negative - continue duo nebs, transitioned to oral prednisone, no wheezing - continue Dulera BID, needs outpatient PFTs. - echo showed EF 60-65% with grade 1 diastolic dysfunction    Active Problems:   OBESITY -BMI 47.3, patient counseled on diet and weight control  Acute metabolic encephalopathy in the setting of bipolar disorder. /UTI  -Mental status clear, at baseline  -DC IV Rocephin, received for 3 days total.  Urine culture showed multiple species.   -Continue Cymbalta, trazodone,  Recent fall, right ankle pain -Patient reports fall a week ago, right ankle x-ray obtained  shows Rowe avulsed fragment of the anterior distal talus -Discussed with orthopedics, recommended cam walker boot right foot, weightbearing as tolerated, follow-up with Lindsey Rowe in 1 week after discharge -PT evaluation recommended skilled nursing facility, consult placed to social work, pending facility    HYPERTENSION, BENIGN ESSENTIAL -Currently stable, continue Toprol-XL    Hypothyroid -TSH 2.9, continue Synthroid  Chronic diastolic dysfunction - Follow 2D echo, currently compensated -Prior echo 03/2016 showed EF of 60 to 65% with grade 2 diastolic dysfunction.,  Not on Lasix  Ascending aortic aneurysm -Seen on CT chest, 4.2 cm ascending aortic aneurysm, no change, recommended interval imaging follow-up by CTA or MRA   ?  Diarrhea Per patient resolved  Code Status: Full CODE STATUS DVT Prophylaxis:  Lovenox Family Communication: Discussed in detail with the patient, all imaging results, lab results explained to the patient    Disposition Plan: Awaiting skilled nursing facility bed Time Spent in minutes 15 minutes  Procedures:  CTA chest Echo : EF 60-65 %,   Consultants:   None   Antimicrobials:   IV Rocephin 5/23   Medications  Scheduled Meds: . benzonatate  100 mg Oral TID  . DULoxetine  30 mg Oral BID  . enoxaparin (LOVENOX) injection  60 mg Subcutaneous Q24H  . fluticasone  1 spray Each Nare Daily  . guaiFENesin  600 mg Oral BID  . levothyroxine  75 mcg Oral QAC breakfast  . lisinopril  10 mg Oral Daily  . loratadine  10 mg Oral Daily  . mouth rinse  15 mL Mouth Rinse BID  . metoprolol succinate  50 mg Oral Daily  . mometasone-formoterol  2 puff Inhalation BID  . pantoprazole  40 mg Oral Daily  . pravastatin  20 mg Oral QHS  . predniSONE  40 mg Oral Q breakfast  . pregabalin  100 mg Oral BID  . traZODone  300 mg Oral QHS  . vitamin B-12  1,000 mcg Oral Daily   Continuous Infusions:  PRN Meds:.acetaminophen **OR** acetaminophen,  guaiFENesin-dextromethorphan, hydrALAZINE, ipratropium-albuterol, loperamide, ondansetron **OR** ondansetron (ZOFRAN) IV, traMADol   Antibiotics   Anti-infectives (From admission, onward)   Start     Dose/Rate Route Frequency Ordered Stop   06/27/17 0800  cefTRIAXone (ROCEPHIN) 1 g in sodium chloride 0.9 % 100 mL IVPB  Status:  Discontinued     1 g 200 mL/hr over 30 Minutes Intravenous Every 24 hours 06/27/17 0725 06/30/17 0810        Subjective:   Lindsey Rowe was seen and examined today.  No complaints, no diarrhea.  No wheezing.  No fevers or chills.   Patient denies dizziness, chest pain, abdominal pain,new weakness, numbess, tingling.  Objective:   Vitals:   07/01/17 0329 07/01/17 0744 07/01/17 0826 07/01/17 1108  BP: (!) 106/58 140/66  106/64  Pulse: 67 65  (!) 56  Resp: 14 14  17   Temp: 98.3 F (36.8 C) 98.9 F (37.2 C)  98.2 F (36.8 C)  TempSrc: Oral Oral  Oral  SpO2: 100% 99% 98% 99%  Weight: 117.4 kg (258 lb 13.1 oz)     Height:        Intake/Output Summary (Last 24 hours) at 07/01/2017 1116 Last data filed at 07/01/2017 0900 Gross per 24 hour  Intake 840 ml  Output 1750 ml  Net -910 ml     Wt Readings from Last 3 Encounters:  07/01/17 117.4 kg (258 lb 13.1 oz)  06/09/17 124.6 kg (274 lb 11.1 oz)  05/20/17 121.1 kg (267 lb)     Exam   Physical Exam   General: Alert and oriented x 3, NAD  Eyes:   HEENT:  Atraumatic, normocephalic  Cardiovascular: S1 S2 auscultated, Regular rate and rhythm. No pedal edema b/l  Respiratory: Clear to auscultation bilaterally, no wheezing, rales or rhonchi  Gastrointestinal: Soft, nontender, nondistended, + bowel sounds  Ext: no pedal edema bilaterally  Neuro: no new deficits  Musculoskeletal: No digital cyanosis, clubbing  Skin: No rashes  Psych: Normal affect and demeanor, alert and oriented x3     Data Reviewed:  I have personally reviewed following labs and imaging studies  Micro  Results Recent Results (from the past 240 hour(s))  Urine culture     Status: Abnormal   Collection Time: 06/27/17  6:13 AM  Result Value Ref Range Status   Specimen Description URINE, CLEAN CATCH  Final   Special Requests   Final    NONE Performed at [facility], [address], [place], [place] [address]    Culture MULTIPLE SPECIES PRESENT, SUGGEST RECOLLECTION (A)  Final   Report Status 06/28/2017 FINAL  Final  Respiratory Panel by PCR  Status: Abnormal   Collection Time: 06/27/17  6:20 AM  Result Value Ref Range Status   Adenovirus DETECTED (A) NOT DETECTED Final   Coronavirus 229E NOT DETECTED NOT DETECTED Final   Coronavirus HKU1 NOT DETECTED NOT DETECTED Final   Coronavirus NL63 NOT DETECTED NOT DETECTED Final   Coronavirus OC43 NOT DETECTED NOT DETECTED Final   Metapneumovirus NOT DETECTED NOT DETECTED Final   Rhinovirus / Enterovirus NOT DETECTED NOT DETECTED Final   Influenza A NOT DETECTED NOT DETECTED Final   Influenza B NOT DETECTED NOT DETECTED Final   Parainfluenza Virus 1 NOT DETECTED NOT DETECTED Final   Parainfluenza Virus 2 NOT DETECTED NOT DETECTED Final   Parainfluenza Virus 3 NOT DETECTED NOT DETECTED Final   Parainfluenza Virus 4 NOT DETECTED NOT DETECTED Final   Respiratory Syncytial Virus NOT DETECTED NOT DETECTED Final   Bordetella pertussis NOT DETECTED NOT DETECTED Final   Chlamydophila pneumoniae NOT DETECTED NOT DETECTED Final   Mycoplasma pneumoniae NOT DETECTED NOT DETECTED Final    Comment: Performed at [facility], [address] [place], [place] [address]  Culture, expectorated sputum-assessment     Status: None   Collection Time: 06/28/17  8:01 AM  Result Value Ref Range Status   Specimen Description SPUTUM  Final   Special Requests NONE  Final   Sputum evaluation   Final    Sputum specimen not acceptable for testing.  Please recollect.   Gram Stain Report Called to,Read Back By and Verified With: C DILLARD,RN AT 0945  06/28/17 BY L BENFIELD Performed at [facility], [address], [place], [place] [address]    Report Status 06/28/2017 FINAL  Final    Radiology Reports Dg Chest 2 View  Result Date: 06/09/2017 CLINICAL DATA:  72 year old female with panic episodes and hallucination. EXAM: CHEST - 2 VIEW COMPARISON:  Chest CT dated 05/01/2017 FINDINGS: The lungs are clear. There is no pleural effusion or pneumothorax. Top-normal cardiac silhouette. A loop recorder device is noted. No acute osseous pathology. IMPRESSION: No active cardiopulmonary disease. Electronically Signed   By: Elgie Collard M.D.   On: 06/09/2017 03:59   Dg Ankle Complete Right  Result Date: 06/28/2017 CLINICAL DATA:  Fall, twisted ankle. EXAM: RIGHT ANKLE - COMPLETE 3+ VIEW COMPARISON:  04/10/2016 FINDINGS: Posterior and plantar calcaneal spurs. Rowe avulsed fragment off the anterior distal talus. No tibial or fibular abnormality. Joint space is maintained. IMPRESSION: Rowe avulsed fragment off the anterior distal talus. Electronically Signed   By: Charlett Nose M.D.   On: 06/28/2017 09:59   Ct Head Wo Contrast  Result Date: 06/09/2017 CLINICAL DATA:  72 year old female with altered mental status. EXAM: CT HEAD WITHOUT CONTRAST TECHNIQUE: Contiguous axial images were obtained from the base of the skull through the vertex without intravenous contrast. COMPARISON:  Head CT dated 03/23/2016 FINDINGS: Evaluation of this exam is limited due to motion artifact. Brain: There is mild age-related atrophy and chronic microvascular ischemic changes. No acute intracranial hemorrhage. No mass effect or midline shift. No extra-axial fluid collection. Vascular: No hyperdense vessel or unexpected calcification. Skull: Normal. Negative for fracture or focal lesion. Sinuses/Orbits: No acute finding. Other: None IMPRESSION: 1. No acute intracranial hemorrhage. 2. Mild age-related atrophy and chronic microvascular ischemic changes. Electronically Signed    By: Elgie Collard M.D.   On: 06/09/2017 04:11   Ct Angio Chest Pe W And/or Wo Contrast  Result Date: 06/27/2017 CLINICAL DATA:  Worsening shortness of breath and cough EXAM: CT  ANGIOGRAPHY CHEST WITH CONTRAST TECHNIQUE: Multidetector CT imaging of the chest was performed using the standard protocol during bolus administration of intravenous contrast. Multiplanar CT image reconstructions and MIPs were obtained to evaluate the vascular anatomy. CONTRAST:  ISOVUE-370 IOPAMIDOL (ISOVUE-370) INJECTION 76% COMPARISON:  Chest x-ray 06/26/2016, CT chest 05/01/2017, 09/25/2016 FINDINGS: Cardiovascular: Satisfactory opacification of the pulmonary arteries to the segmental level. No evidence of pulmonary embolism. Normal heart size. No pericardial effusion. 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 No dissection. Coronary vascular calcification. Mild aortic atherosclerosis. Mediastinum/Nodes: Midline trachea. No thyroid mass. No significant adenopathy. Esophagus within normal limits Lungs/Pleura: Atelectasis in the right upper lobe and lingula. No acute infiltrate or effusion. Upper Abdomen: Surgical clips in the left upper quadrant. No acute abnormality. Musculoskeletal: Degenerative changes of the spine. No acute or suspicious abnormality Review of the MIP images confirms the above findings. IMPRESSION: 1. Negative for acute pulmonary embolus or aortic dissection 2. 4.2 cm ascending aortic aneurysm, no change. Recommend annual imaging followup by CTA or MRA. This recommendation follows 2010 ACCF/AHA/AATS/ACR/ASA/SCA/SCAI/SIR/STS/SVM Guidelines for the Diagnosis and Management of Patients with Thoracic Aortic Disease. Circulation. 2010; 121: Z610-R604 3. No focal pulmonary infiltrate Aortic Atherosclerosis (ICD10-I70.0). Electronically Signed   By: [name] Pang M.D.   On: 06/27/2017 00:12   Dg Chest Port 1 View  Result Date: 06/26/2017 CLINICAL DATA:  Shortness of breath and productive cough  for 1 day. EXAM: PORTABLE CHEST 1 VIEW COMPARISON:  06/09/2017 FINDINGS: The cardiac silhouette is upper limits of normal in size, unchanged. The lungs are mildly hypoinflated with similar appearance of mild right hemidiaphragm elevation. No airspace consolidation, edema, sizable pleural effusion, or pneumothorax is identified. A loop recorder and left upper quadrant abdominal surgical clips are again noted. No acute osseous abnormality is seen. IMPRESSION: No active disease. Electronically Signed   By: Sebastian Ache M.D.   On: 06/26/2017 15:43    Lab Data:  CBC: Recent Labs  Lab 06/26/17 1633 06/27/17 0404 06/28/17 0257 06/29/17 0258 06/30/17 0241 07/01/17 0231  WBC 9.1 9.0 7.6 11.3* 14.6* 9.7  NEUTROABS 6.6  --   --   --   --   --   HGB 12.5 11.8* 12.4 11.7* 12.1 11.3*  HCT 40.8 38.3 40.9 37.9 39.2 36.5  MCV 95.3 94.3 96.7 95.0 94.2 94.3  PLT 240 235 229 253 271 298   Basic Metabolic Panel: Recent Labs  Lab 06/27/17 0404 06/28/17 0257 06/29/17 0258 06/30/17 0241 07/01/17 0231  NA 140 144 139 139 138  K 3.8 3.5 4.2 4.5 5.3*  CL 89* 93* 93* 93* 95*  CO2 39* 39* 35* 36* 33*  GLUCOSE 121* 105* 190* 114* 100*  BUN 28*  CREATININE 0.91 0.91 0.99 1.04* 1.05*  CALCIUM 9.2 9.2 9.1 9.0 8.8*   GFR: Estimated Creatinine Clearance: 61.7 mL/min (A) (by C-G formula based on SCr of 1.05 mg/dL (H)). Liver Function Tests: Recent Labs  Lab 06/27/17 0218  AST 28  ALT 14  ALKPHOS 64  BILITOT 0.8  PROT 6.9  ALBUMIN 3.9   No results for input(s): LIPASE, AMYLASE in the last 168 hours. Recent Labs  Lab 06/27/17 1822  AMMONIA 19   Coagulation Profile: No results for input(s): INR, PROTIME in the last 168 hours. Cardiac Enzymes: Recent Labs  Lab 06/27/17 0754  TROPONINI <0.03   BNP (last 3 results) No results for input(s): PROBNP in the last 8760 hours. HbA1C: No results for input(s): HGBA1C in  the last 72 hours. CBG: No results for input(s): GLUCAP in the last  168 hours. Lipid Profile: No results for input(s): CHOL, HDL, LDLCALC, TRIG, CHOLHDL, LDLDIRECT in the last 72 hours. Thyroid Function Tests: No results for input(s): TSH, T4TOTAL, FREET4, T3FREE, THYROIDAB in the last 72 hours. Anemia Panel: No results for input(s): VITAMINB12, FOLATE, FERRITIN, TIBC, IRON, RETICCTPCT in the last 72 hours. Urine analysis:    Component Value Date/Time   COLORURINE YELLOW 06/27/2017 0613   APPEARANCEUR HAZY (A) 06/27/2017 0613   LABSPEC 1.019 06/27/2017 0613   PHURINE 8.0 06/27/2017 0613   GLUCOSEU NEGATIVE 06/27/2017 0613   HGBUR Rowe (A) 06/27/2017 0613   HGBUR trace-lysed 09/23/2009 0000   BILIRUBINUR NEGATIVE 06/27/2017 0613   KETONESUR NEGATIVE 06/27/2017 0613   PROTEINUR NEGATIVE 06/27/2017 0613   UROBILINOGEN 1.0 08/13/2012 1757   NITRITE NEGATIVE 06/27/2017 0613   LEUKOCYTESUR MODERATE (A) 06/27/2017 1610     Algie Westry M.D. [facility] 07/01/2017, 11:16 AM  Pager: [phone] Between 7am to 7pm - call Pager - [phone]  After 7pm go to [email or web address] - password [email or web address]  Call night coverage person covering after 7pm

## 2017-07-02 ENCOUNTER — Ambulatory Visit (INDEPENDENT_AMBULATORY_CARE_PROVIDER_SITE_OTHER): Payer: Medicare HMO | Admitting: *Deleted

## 2017-07-02 DIAGNOSIS — R55 Syncope and collapse: Secondary | ICD-10-CM | POA: Diagnosis not present

## 2017-07-02 LAB — CBC
HCT: 37.4 % (ref 36.0–46.0)
Hemoglobin: 11.3 g/dL — ABNORMAL LOW (ref 12.0–15.0)
MCH: 29 pg (ref 26.0–34.0)
MCHC: 30.2 g/dL (ref 30.0–36.0)
MCV: 95.9 fL (ref 78.0–100.0)
Platelets: 285 10*3/uL (ref 150–400)
RBC: 3.9 MIL/uL (ref 3.87–5.11)
RDW: 13.1 % (ref 11.5–15.5)
WBC: 8.4 10*3/uL (ref 4.0–10.5)

## 2017-07-02 LAB — BASIC METABOLIC PANEL
Anion gap: 7 (ref 5–15)
BUN: 26 mg/dL — ABNORMAL HIGH (ref 6–20)
CO2: 36 mmol/L — ABNORMAL HIGH (ref 22–32)
Calcium: 8.7 mg/dL — ABNORMAL LOW (ref 8.9–10.3)
Chloride: 97 mmol/L — ABNORMAL LOW (ref 101–111)
Creatinine, Ser: 1.02 mg/dL — ABNORMAL HIGH (ref 0.44–1.00)
GFR calc Af Amer: >60 mL/min (ref >60)
GFR calc non Af Amer: 54 mL/min — ABNORMAL LOW (ref >60)
Glucose, Bld: 88 mg/dL (ref 65–99)
Potassium: 4.1 mmol/L (ref 3.5–5.1)
Sodium: 140 mmol/L (ref 135–145)

## 2017-07-02 MED ORDER — PREDNISONE 20 MG PO TABS: 40.0000 mg | ORAL_TABLET | Freq: Every day | ORAL | 0 refills | Status: AC

## 2017-07-02 MED ORDER — LOPERAMIDE HCL 2 MG PO CAPS: 2.0000 mg | ORAL_CAPSULE | Freq: Three times a day (TID) | ORAL | 0 refills | Status: DC | PRN

## 2017-07-02 MED ORDER — ALBUTEROL SULFATE HFA 108 (90 BASE) MCG/ACT IN AERS: 2.0000 | INHALATION_SPRAY | Freq: Four times a day (QID) | RESPIRATORY_TRACT | 2 refills | Status: DC | PRN

## 2017-07-02 NOTE — Progress Notes (Signed)
Carelink Summary Report / Loop Recorder 

## 2017-07-02 NOTE — Progress Notes (Signed)
Clinical Social Worker following patient for support and discharge placement. Patient has a bed at [facility] . CSW requested facility to start authorization through patients insurance at 9:30am this morning. Facility will reach out to CSW once authorization has been attained.  Marrianne Mood, MSW,  [employer] [phone]

## 2017-07-02 NOTE — Progress Notes (Signed)
Physical Therapy Treatment Patient Details Name: Lindsey Rowe MRN: [id number] DOB: September 20, 1945 Today's Date: 07/02/2017    History of Present Illness 72 y.o. female with history of HTN, diastolic dysfunction, hypothyroidism, bipolar disorder, HLD. Admitted with SOB, UTI, with noted ascending aortic aneurysm    PT Comments    Pt displays marked improvement compared to previous sessions as she was able to ambulate 150 feet min guard with RW in CAM boot. Pt also demonstrates improved bed mobility and transfer ability. SpO2% found to be 91% after ambulation on 2L O2. Pt reports that she is being D/c to SNF today and that she wants to return home soon after with intermittent assistance  Follow Up Recommendations  Supervision/Assistance - 24 hour;SNF     Equipment Recommendations  None recommended by PT    Recommendations for Other Services       Precautions / Restrictions Precautions Precautions: Fall Required Braces or Orthoses: (CAM Boot) Restrictions Weight Bearing Restrictions: Yes RLE Weight Bearing: Weight bearing as tolerated Other Position/Activity Restrictions: WBAT RLE in CAM boot    Mobility  Bed Mobility   Bed Mobility: Sit to Supine     Supine to sit: HOB elevated;Supervision     General bed mobility comments: Supervision for safety and use of upper R bed rail  Transfers Overall transfer level: Needs assistance Equipment used: Rolling walker (2 wheeled) Transfers: Sit to/from Stand Sit to Stand: Min guard         General transfer comment: Pt performed sit>stand from bed with RW min guard with CAM boot  Ambulation/Gait Ambulation/Gait assistance: Min guard Ambulation Distance (Feet): 150 Feet Assistive device: Rolling walker (2 wheeled) Gait Pattern/deviations: Step-through pattern;Decreased stride length Gait velocity: Decreased   General Gait Details: Pt ambulated 150 ft in CAM boot min guard with RW with increased trunk lean towards stance leg.  No LOB or instability detected. Pt R ankle pain increased slightly with gait   Stairs             Wheelchair Mobility    Modified Rankin (Stroke Patients Only)       Balance Overall balance assessment: Needs assistance Sitting-balance support: No upper extremity supported Sitting balance-Leahy Scale: Good     Standing balance support: Bilateral upper extremity supported;During functional activity Standing balance-Leahy Scale: Fair Standing balance comment: Relies on RW for support                            Cognition Arousal/Alertness: Awake/alert Behavior During Therapy: WFL for tasks assessed/performed Overall Cognitive Status: Within Functional Limits for tasks assessed                                 General Comments: Pt reports readiness to D/C to SNF and would like to return home soon      Exercises      General Comments        Pertinent Vitals/Pain Pain Assessment: 0-10 Pain Score: 4  Pain Location: R ankle Pain Descriptors / Indicators: Discomfort Pain Intervention(s): Limited activity within patient's tolerance;Monitored during session    Home Living                      Prior Function            PT Goals (current goals can now be found in the care plan section) Acute Rehab PT Goals  Patient Stated Goal: To D/C to SNF and then return home ASAP PT Goal Formulation: With patient Time For Goal Achievement: 07/16/17 Potential to Achieve Goals: Good Progress towards PT goals: Progressing toward goals    Frequency    Min 2X/week      PT Plan Current plan remains appropriate    Co-evaluation              AM-PAC PT "6 Clicks" Daily Activity  Outcome Measure  Difficulty turning over in bed (including adjusting bedclothes, sheets and blankets)?: None Difficulty moving from lying on back to sitting on the side of the bed? : None Difficulty sitting down on and standing up from a chair with arms  (e.g., wheelchair, bedside commode, etc,.)?: A Little Help needed moving to and from a bed to chair (including a wheelchair)?: A Little Help needed walking in hospital room?: A Little Help needed climbing 3-5 steps with a railing? : A Lot 6 Click Score: 19    End of Session Equipment Utilized During Treatment: Gait belt;Oxygen(2L O2 ) Activity Tolerance: Patient tolerated treatment well Patient left: in chair;with call bell/phone within reach;with nursing/sitter in room Nurse Communication: Mobility status PT Visit Diagnosis: Unsteadiness on feet (R26.81);Other abnormalities of gait and mobility (R26.89);Muscle weakness (generalized) (M62.81);Pain Pain - Right/Left: Right Pain - part of body: Ankle and joints of foot     Time: 1000-1030 PT Time Calculation (min) (ACUTE ONLY): 30 min  Charges:  $Gait Training: 8-22 mins $Therapeutic Activity: 8-22 mins                    G Codes:       Gabe Berlinda Farve, SPT   [employer] 07/02/2017, 11:06 AM

## 2017-07-02 NOTE — Consult Note (Signed)
[facility] CM Primary Care Navigator  07/02/2017  Lindsey Rowe [birth year] [id number]   Met withpatientat the bedside toidentify possible discharge needs. Patientreports that she "couldn't breath" that had led to thisadmission.  PatientendorsesDr.Elaine Laurann Montana with [place] [facility] asherprimary care provider.   Patient is using [employer] representative on [place] [facility] servicetoobtain medications without difficulty.  Patientreports that she has beenmanaginghermedications at [employer] use of "pill box" system filled once a week.  Patient verbalized using [facility] through [place] (Department of Social Services) which was providing transportationto her doctors' appointments, or caregiver provides transportation if needed.  Made patient aware of [employer] transportation benefits.  Patientmentioned having a caregiver from "[facility] [employer]" who provides assistance with her care needs at home daily for 2-3 hours.  Anticipated plan for discharge isskilled nursing facility (SNF)per therapy recommendation, for short term rehabilitationaccording to patient.  Patient voiced understandingto callprimarycareprovider'soffice whenshereturns backhome,for a post discharge follow-upvisitwithin1- 2 weeksor sooner if needs arise.Patient letter (with PCP's contact number) was provided asareminder.   Explained topatient regarding [facility] CM services available for health management/ resourcesat home but she denies any needs or issues at this time.  Patient verbalizedunderstandingof needto seekreferral from primary care provider to [facility] care management ifdeemed necessary and appropriatefor anyservicesin the nearfuture-once shereturns backhome.   [facility] care management information was provided for futureneedsthat shemay have.  Primary care provider's office is listed as providing transition of care  (TOC) follow-up.    For additional questions please contact:  Edwena Felty A. Percell Lamboy, BSN, RN-BC [facility] PRIMARY CARE Navigator Cell: [phone]

## 2017-07-02 NOTE — Progress Notes (Signed)
CSW following patient for support an discharge needs. Patient has chosen a SNF bed but unfortunately  due to the holiday ALL insurance company's  were closed and were unable to process patient insurance. CSW will continue to follow patient for support and to assist with transition to short term rehab.   Marrianne Mood, MSW,  [employer] [phone]

## 2017-07-02 NOTE — Care Management (Signed)
CSW aware of discharge order and will facilite discharge to SNF

## 2017-07-02 NOTE — Discharge Summary (Addendum)
Physician Discharge Summary   Patient ID: Lindsey Rowe MRN: [id number] DOB/AGE: [birth year] 72 y.o.  Admit date: 06/26/2017 Discharge date: 07/02/2017  Primary Care Physician:  Maurice Small, MD   Recommendations for Outpatient Follow-up:  1. Follow up with PCP in 1-2 weeks 2. She needs to follow-up with Dr. [name], orthopedics in 1 week after discharge from the hospital for imagings of her right foot 3. Ascending aortic aneurysm -Seen on CT chest, 4.2 cm ascending aortic aneurysm, no change, recommended interval imaging follow-up by CTA or MRA   Home Health: Patient will be discharged to skilled nursing facility Equipment/Devices:   Discharge Condition: stable  CODE STATUS: FULL  Diet recommendation: Heart healthy   Discharge Diagnoses:    . Acute respiratory failure with hypoxia (HCC) . Hypothyroid . HYPERTENSION, BENIGN ESSENTIAL . Depression . BIPOLAR AFFECTIVE DISORDER . OBESITY . Acute lower UTI . Acute metabolic encephalopathy . Recent fall with right ankle pain/small fracture Chronic diastolic dysfunction   Consults: Orthopedics    Allergies:   Allergies  Allergen Reactions  . Gabapentin Other (See Comments)    Right foot and leg swelled   . Cefadroxil Itching    Ends of hair itched      DISCHARGE MEDICATIONS: Allergies as of 07/02/2017      Reactions   Gabapentin Other (See Comments)   Right foot and leg swelled    Cefadroxil Itching   Ends of hair itched       Medication List    TAKE these medications   albuterol 108 (90 Base) MCG/ACT inhaler Commonly known as:  PROVENTIL HFA;VENTOLIN HFA Inhale 2 puffs into the lungs every 6 (six) hours as needed for wheezing or shortness of breath.   BC HEADACHE POWDER PO Take 1 packet by mouth as needed (headache, pain).   benzonatate 100 MG capsule Commonly known as:  TESSALON Take 1 capsule (100 mg total) by mouth 3 (three) times daily as needed for cough.   D3-1000 1000 units  capsule Generic drug:  Cholecalciferol Take 1,000 Units by mouth daily.   DULoxetine 30 MG capsule Commonly known as:  CYMBALTA Take 1 capsule (30 mg total) by mouth 2 (two) times daily.   fluticasone 50 MCG/ACT nasal spray Commonly known as:  FLONASE Place 1 spray into both nostrils daily as needed for allergies or rhinitis.   guaiFENesin 600 MG 12 hr tablet Commonly known as:  MUCINEX Take 1 tablet (600 mg total) by mouth 2 (two) times daily.   guaiFENesin-dextromethorphan 100-10 MG/5ML syrup Commonly known as:  ROBITUSSIN DM Take 5 mLs by mouth every 4 (four) hours as needed for cough.   levothyroxine 75 MCG tablet Commonly known as:  SYNTHROID, LEVOTHROID Take 75 mcg by mouth daily before breakfast.   lisinopril 10 MG tablet Commonly known as:  PRINIVIL,ZESTRIL Take 10 mg by mouth daily.   loperamide 2 MG capsule Commonly known as:  IMODIUM Take 1 capsule (2 mg total) by mouth 3 (three) times daily as needed for diarrhea or loose stools.   loratadine 10 MG tablet Commonly known as:  CLARITIN Take 1 tablet (10 mg total) by mouth daily. allergies   meloxicam 15 MG tablet Commonly known as:  MOBIC Take 15 mg by mouth daily.   metoprolol succinate 50 MG 24 hr tablet Commonly known as:  TOPROL-XL Take 1 tablet (50 mg total) by mouth daily. Take with or immediately following a meal.   mometasone-formoterol 100-5 MCG/ACT Aero Commonly known as:  DULERA Inhale 2 puffs  into the lungs 2 (two) times daily.   omeprazole 20 MG capsule Commonly known as:  PRILOSEC Take 20 mg by mouth 2 (two) times daily before a meal.   pravastatin 20 MG tablet Commonly known as:  PRAVACHOL Take 20 mg by mouth at bedtime.   predniSONE 20 MG tablet Commonly known as:  DELTASONE Take 2 tablets (40 mg total) by mouth daily with breakfast for 2 days. Then off   pregabalin 100 MG capsule Commonly known as:  LYRICA Take 1 capsule (100 mg total) by mouth 2 (two) times daily.    SUMAtriptan 100 MG tablet Commonly known as:  IMITREX Take 100 mg by mouth See admin instructions. Take  at onset of headache, may repeat in 2 hours if needed. Do not exceed 2 doses in 24 hours.   traMADol 50 MG tablet Commonly known as:  ULTRAM Take 1 tablet (50 mg total) by mouth every 6 (six) hours as needed for moderate pain or severe pain. What changed:  reasons to take this   traZODone 100 MG tablet Commonly known as:  DESYREL Take 300 mg by mouth at bedtime.   vitamin B-12 1000 MCG tablet Commonly known as:  CYANOCOBALAMIN Take 1,000 mcg by mouth daily.        Brief H and P: For complete details please refer to admission H and P, but in brief Patient is a 72 year old female with history of hypertension, diastolic dysfunction, hypothyroidism, bipolar disorder, hyperlipidemia presented to ED with shortness of breath, nonproductive cough for last 3 weeks.  Shortness of breath worse on exertion, no fevers or chills or chest pain.  In ED, was found to be hypoxic, required 4 L O2.  CTA negative for PE or pneumonia.  Mildly hypoxic and lab work showed metabolic alkalosis, salicylate levels negative.  Patient was confused and anxious in the ED, admitted for further work-up.   Hospital Course:  Acute respiratory failure with hypoxia (HCC) likely due to acute bronchitis, underlying COPD? -possibly due to diastolic dysfunction, acute bronchitis, still has some wheezing, obesity hypoventilation -BNP 39.8, troponins negative.  Respiratory virus panel showed adenovirus. - continue albuterol, Dulera twice daily, transitioned to oral prednisone -Currently no wheezing, needs outpatient PFTs. - echo showed EF 60-65% with grade 1 diastolic dysfunction      OBESITY -BMI 47.3, patient counseled on diet and weight control  Acute metabolic encephalopathy in the setting of bipolar disorder. /UTI  -Mental status clear, at baseline  -Patient received IV Rocephin for 3 days total, urine  culture showed multiple species, does not need any further IV antibiotics.   -Continue Cymbalta, trazodone,  Recent fall, right ankle pain -Patient reports fall a week ago, right ankle x-ray obtained shows small avulsed fragment of the anterior distal talus -Discussed with orthopedics, recommended cam walker boot right foot, weightbearing as tolerated, follow-up with Dr. [name] in 1 week after discharge -PT evaluation recommended skilled nursing facility    HYPERTENSION, BENIGN ESSENTIAL -Currently stable, continue Toprol-XL    Hypothyroid -TSH 2.9, continue Synthroid  Chronic diastolic dysfunction 2D echo on 5/24 showed EF of 60 to 65%, grade 1 diastolic dysfunction  Ascending aortic aneurysm -Seen on CT chest, 4.2 cm ascending aortic aneurysm, no change, recommended interval imaging follow-up by CTA or MRA   Day of Discharge S: No complaints, awaiting skilled nursing facility  BP (!) 114/50 (BP Location: Left Wrist)   Pulse (!) 56   Temp 97.7 F (36.5 C) (Oral)   Resp 14   Ht   (1.6 m)   Wt 116.5 kg (256 lb 14.4 oz)   SpO2 98%   BMI 45.51 kg/m   Physical Exam: General: Alert and awake oriented x3 not in any acute distress. HEENT: anicteric sclera, pupils reactive to light and accommodation CVS: S1-S2 clear no murmur rubs or gallops Chest: clear to auscultation bilaterally, no wheezing rales or rhonchi Abdomen: soft nontender, nondistended, normal bowel sounds Extremities: no cyanosis, clubbing or edema noted bilaterally Neuro: no new deficit   The results of significant diagnostics from this hospitalization (including imaging, microbiology, ancillary and laboratory) are listed below for reference.      Procedures/Studies: Study Conclusions  - Left ventricle: The cavity size was normal. Wall thickness was   increased in a pattern of mild LVH. Systolic function was normal.   The estimated ejection fraction was in the range of 60% to 65%.   Wall  motion was normal; there were no regional wall motion   abnormalities. Doppler parameters are consistent with abnormal   left ventricular relaxation (grade 1 diastolic dysfunction). The   E/e&' ratio is between 8-15, suggesting indeterminate LV filling   pressure. - Aorta: Ascending aortic diameter: 43 mm (S). - Ascending aorta: The ascending aorta was moderately dilated. - Left atrium: The atrium was normal in size. - Inferior vena cava: The vessel was normal in size. The   respirophasic diameter changes were in the normal range (>= 50%),   consistent with normal central venous pressure.    Dg Chest 2 View  Result Date: 06/09/2017 CLINICAL DATA:  72 year old female with panic episodes and hallucination. EXAM: CHEST - 2 VIEW COMPARISON:  Chest CT dated 05/01/2017 FINDINGS: The lungs are clear. There is no pleural effusion or pneumothorax. Top-normal cardiac silhouette. A loop recorder device is noted. No acute osseous pathology. IMPRESSION: No active cardiopulmonary disease. Electronically Signed   By: Elgie Collard M.D.   On: 06/09/2017 03:59   Dg Ankle Complete Right  Result Date: 06/28/2017 CLINICAL DATA:  Fall, twisted ankle. EXAM: RIGHT ANKLE - COMPLETE 3+ VIEW COMPARISON:  04/10/2016 FINDINGS: Posterior and plantar calcaneal spurs. Small avulsed fragment off the anterior distal talus. No tibial or fibular abnormality. Joint space is maintained. IMPRESSION: Small avulsed fragment off the anterior distal talus. Electronically Signed   By: Charlett Nose M.D.   On: 06/28/2017 09:59   Ct Head Wo Contrast  Result Date: 06/09/2017 CLINICAL DATA:  72 year old female with altered mental status. EXAM: CT HEAD WITHOUT CONTRAST TECHNIQUE: Contiguous axial images were obtained from the base of the skull through the vertex without intravenous contrast. COMPARISON:  Head CT dated 03/23/2016 FINDINGS: Evaluation of this exam is limited due to motion artifact. Brain: There is mild age-related atrophy  and chronic microvascular ischemic changes. No acute intracranial hemorrhage. No mass effect or midline shift. No extra-axial fluid collection. Vascular: No hyperdense vessel or unexpected calcification. Skull: Normal. Negative for fracture or focal lesion. Sinuses/Orbits: No acute finding. Other: None IMPRESSION: 1. No acute intracranial hemorrhage. 2. Mild age-related atrophy and chronic microvascular ischemic changes. Electronically Signed   By: Elgie Collard M.D.   On: 06/09/2017 04:11   Ct Angio Chest Pe W And/or Wo Contrast  Result Date: 06/27/2017 CLINICAL DATA:  Worsening shortness of breath and cough EXAM: CT ANGIOGRAPHY CHEST WITH CONTRAST TECHNIQUE: Multidetector CT imaging of the chest was performed using the standard protocol during bolus administration of intravenous contrast. Multiplanar CT image reconstructions and MIPs were obtained to evaluate the vascular anatomy. CONTRAST:  ISOVUE-370 IOPAMIDOL (ISOVUE-370) INJECTION 76% COMPARISON:  Chest x-ray 06/26/2016, CT chest 05/01/2017, 09/25/2016 FINDINGS: Cardiovascular: Satisfactory opacification of the pulmonary arteries to the segmental level. No evidence of pulmonary embolism. Normal heart size. No pericardial effusion. 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 No dissection. Coronary vascular calcification. Mild aortic atherosclerosis. Mediastinum/Nodes: Midline trachea. No thyroid mass. No significant adenopathy. Esophagus within normal limits Lungs/Pleura: Atelectasis in the right upper lobe and lingula. No acute infiltrate or effusion. Upper Abdomen: Surgical clips in the left upper quadrant. No acute abnormality. Musculoskeletal: Degenerative changes of the spine. No acute or suspicious abnormality Review of the MIP images confirms the above findings. IMPRESSION: 1. Negative for acute pulmonary embolus or aortic dissection 2. 4.2 cm ascending aortic aneurysm, no change. Recommend annual imaging followup by CTA or  MRA. This recommendation follows 2010 ACCF/AHA/AATS/ACR/ASA/SCA/SCAI/SIR/STS/SVM Guidelines for the Diagnosis and Management of Patients with Thoracic Aortic Disease. Circulation. 2010; 121: Z610-R604 3. No focal pulmonary infiltrate Aortic Atherosclerosis (ICD10-I70.0). Electronically Signed   By: [name] Pang M.D.   On: 06/27/2017 00:12   Dg Chest Port 1 View  Result Date: 06/26/2017 CLINICAL DATA:  Shortness of breath and productive cough for 1 day. EXAM: PORTABLE CHEST 1 VIEW COMPARISON:  06/09/2017 FINDINGS: The cardiac silhouette is upper limits of normal in size, unchanged. The lungs are mildly hypoinflated with similar appearance of mild right hemidiaphragm elevation. No airspace consolidation, edema, sizable pleural effusion, or pneumothorax is identified. A loop recorder and left upper quadrant abdominal surgical clips are again noted. No acute osseous abnormality is seen. IMPRESSION: No active disease. Electronically Signed   By: Sebastian Ache M.D.   On: 06/26/2017 15:43       LAB RESULTS: Basic Metabolic Panel: Recent Labs  Lab 07/01/17 0231 07/02/17 0331  NA 138 140  K 5.3* 4.1  CL 95* 97*  CO2 33* 36*  GLUCOSE 100* 88  BUN 28* 26*  CREATININE 1.05* 1.02*  CALCIUM 8.8* 8.7*   Liver Function Tests: Recent Labs  Lab 06/27/17 0218  AST 28  ALT 14  ALKPHOS 64  BILITOT 0.8  PROT 6.9  ALBUMIN 3.9   No results for input(s): LIPASE, AMYLASE in the last 168 hours. Recent Labs  Lab 06/27/17 1822  AMMONIA 19   CBC: Recent Labs  Lab 06/26/17 1633  07/01/17 0231 07/02/17 0331  WBC 9.1   < > 9.7 8.4  NEUTROABS 6.6  --   --   --   HGB 12.5   < > 11.3* 11.3*  HCT 40.8   < > 36.5 37.4  MCV 95.3   < > 94.3 95.9  PLT 240   < > 298 285   < > = values in this interval not displayed.   Cardiac Enzymes: Recent Labs  Lab 06/27/17 0754  TROPONINI <0.03   BNP: Invalid input(s): POCBNP CBG: No results for input(s): GLUCAP in the last 168 hours.    Disposition  and Follow-up: Discharge Instructions    Diet - low sodium heart healthy   Complete by:  As directed    Increase activity slowly   Complete by:  As directed        DISPOSITION: SNF    DISCHARGE FOLLOW-UP Follow-up Information    Maurice Small, MD. Schedule an appointment as soon as possible for a visit in 2 week(s).   Specialty:  Family Medicine Contact information: [address] [place] [address] [phone]  Coralyn Helling, MD. Schedule an appointment as soon as possible for a visit in 2 week(s).   Specialty:  Pulmonary Disease Contact information: [address] [place] [address] [phone]        Samson Frederic, MD. Schedule an appointment as soon as possible for a visit in 1 week(s).   Specialty:  Orthopedic Surgery Why:  please call on Monday to schedule appointment in 7-10 days Contact information: [address] [place] [address] [phone]            Time coordinating discharge:    Signed:   Thad Ranger M.D. [facility] 07/02/2017, 10:37 AM Pager: [phone]

## 2017-07-03 DIAGNOSIS — E032 Hypothyroidism due to medicaments and other exogenous substances: Secondary | ICD-10-CM | POA: Diagnosis not present

## 2017-07-03 DIAGNOSIS — J9601 Acute respiratory failure with hypoxia: Secondary | ICD-10-CM | POA: Diagnosis not present

## 2017-07-03 DIAGNOSIS — F310 Bipolar disorder, current episode hypomanic: Secondary | ICD-10-CM

## 2017-07-03 DIAGNOSIS — G9340 Encephalopathy, unspecified: Secondary | ICD-10-CM | POA: Diagnosis not present

## 2017-07-03 DIAGNOSIS — E039 Hypothyroidism, unspecified: Secondary | ICD-10-CM | POA: Diagnosis not present

## 2017-07-03 DIAGNOSIS — M2550 Pain in unspecified joint: Secondary | ICD-10-CM | POA: Diagnosis not present

## 2017-07-03 DIAGNOSIS — I10 Essential (primary) hypertension: Secondary | ICD-10-CM | POA: Diagnosis not present

## 2017-07-03 DIAGNOSIS — F339 Major depressive disorder, recurrent, unspecified: Secondary | ICD-10-CM | POA: Diagnosis not present

## 2017-07-03 DIAGNOSIS — F3175 Bipolar disorder, in partial remission, most recent episode depressed: Secondary | ICD-10-CM | POA: Diagnosis not present

## 2017-07-03 DIAGNOSIS — N390 Urinary tract infection, site not specified: Secondary | ICD-10-CM | POA: Diagnosis not present

## 2017-07-03 DIAGNOSIS — Z7401 Bed confinement status: Secondary | ICD-10-CM | POA: Diagnosis not present

## 2017-07-03 DIAGNOSIS — M4800 Spinal stenosis, site unspecified: Secondary | ICD-10-CM | POA: Diagnosis not present

## 2017-07-03 DIAGNOSIS — I5032 Chronic diastolic (congestive) heart failure: Secondary | ICD-10-CM | POA: Diagnosis not present

## 2017-07-03 DIAGNOSIS — M6281 Muscle weakness (generalized): Secondary | ICD-10-CM | POA: Diagnosis not present

## 2017-07-03 DIAGNOSIS — R2689 Other abnormalities of gait and mobility: Secondary | ICD-10-CM | POA: Diagnosis not present

## 2017-07-03 DIAGNOSIS — F250 Schizoaffective disorder, bipolar type: Secondary | ICD-10-CM | POA: Diagnosis not present

## 2017-07-03 LAB — BASIC METABOLIC PANEL
Anion gap: 10 (ref 5–15)
BUN: 28 mg/dL — ABNORMAL HIGH (ref 6–20)
CO2: 34 mmol/L — ABNORMAL HIGH (ref 22–32)
Calcium: 8.7 mg/dL — ABNORMAL LOW (ref 8.9–10.3)
Chloride: 96 mmol/L — ABNORMAL LOW (ref 101–111)
Creatinine, Ser: 1.08 mg/dL — ABNORMAL HIGH (ref 0.44–1.00)
GFR calc Af Amer: 59 mL/min — ABNORMAL LOW (ref >60)
GFR calc non Af Amer: 51 mL/min — ABNORMAL LOW (ref >60)
Glucose, Bld: 92 mg/dL (ref 65–99)
Potassium: 4.6 mmol/L (ref 3.5–5.1)
Sodium: 140 mmol/L (ref 135–145)

## 2017-07-03 LAB — CBC
HCT: 36.5 % (ref 36.0–46.0)
Hemoglobin: 11.3 g/dL — ABNORMAL LOW (ref 12.0–15.0)
MCH: 29.7 pg (ref 26.0–34.0)
MCHC: 31 g/dL (ref 30.0–36.0)
MCV: 96.1 fL (ref 78.0–100.0)
Platelets: 286 10*3/uL (ref 150–400)
RBC: 3.8 MIL/uL — ABNORMAL LOW (ref 3.87–5.11)
RDW: 13 % (ref 11.5–15.5)
WBC: 7.8 10*3/uL (ref 4.0–10.5)

## 2017-07-03 NOTE — Progress Notes (Signed)
Removed PIV access and gave a report to [facility] nurse. Pt understood to transfer to SNF. Pt took her belongings. [facility] came to pick up the patient. HS [employer]

## 2017-07-03 NOTE — Progress Notes (Signed)
Clinical Social Worker facilitated patient discharge including contacting patient family and facility to confirm patient discharge plans.  Clinical information faxed to facility and family agreeable with plan.  CSW arranged ambulance transport via [facility] to [facility] .  RN to call [phone] (pt will go in rm# [phone]) for report prior to discharge.  Clinical Social Worker will sign off for now as social work intervention is no longer needed. Please consult [place] again if new need arises.  Marrianne Mood, MSW, [employer] [phone]

## 2017-07-03 NOTE — Progress Notes (Addendum)
[facility] PROGRESS NOTE  Lindsey Rowe ZHY:[id number] DOB: 07-16-1945 DOA: 06/26/2017  PCP: Maurice Small, MD  Brief History/Interval Summary: 72 year old female with history of hypertension, diastolic dysfunction, hypothyroidism, bipolar disorder, hyperlipidemia presented to ED with shortness of breath, nonproductive cough for last 3 weeks. Shortness of breath worse on exertion, no fevers or chills or chest pain. In ED, was found to be hypoxic, required 4 L O2. CTA negative for PE or pneumonia. Mildly hypoxic and lab work showed metabolic alkalosis, salicylate levels negative. Patient was confused and anxious in the ED, admitted for further work-up.  Reason for Visit: Acute respiratory failure with hypoxia  Consultants: None  Procedures:  Transthoracic echocardiogram Study Conclusions  - Left ventricle: The cavity size was normal. Wall thickness was   increased in a pattern of mild LVH. Systolic function was normal.   The estimated ejection fraction was in the range of 60% to 65%.   Wall motion was normal; there were no regional wall motion   abnormalities. Doppler parameters are consistent with abnormal   left ventricular relaxation (grade 1 diastolic dysfunction). The   E/e&' ratio is between 8-15, suggesting indeterminate LV filling   pressure. - Aorta: Ascending aortic diameter: 43 mm (S). - Ascending aorta: The ascending aorta was moderately dilated. - Left atrium: The atrium was normal in size. - Inferior vena cava: The vessel was normal in size. The   respirophasic diameter changes were in the normal range (>= 50%),   consistent with normal central venous pressure.  Impressions:  - Compared to a prior study in 2018, the LVEF is unchanged. The   degree of diastolic dysfunction is less. The ascending aorta is   dilated up to 4.3 cm.  Antibiotics: Ceftriaxone 5/23  Subjective/Interval History: She states that she feels well.  Denies any chest  pain or shortness of breath.  Uses oxygen at home on a chronic basis.  ROS: Denies nausea vomiting  Objective:  Vital Signs  Vitals:   07/03/17 0700 07/03/17 0800 07/03/17 0845 07/03/17 1035  BP: 121/60     Pulse: (!) 58 (!) 56  60  Resp: 13 13    Temp: (!) 97.5 F (36.4 C)     TempSrc: Oral     SpO2: 99% 97% 98%   Weight:      Height:        Intake/Output Summary (Last 24 hours) at 07/03/2017 1046 Last data filed at 07/03/2017 0422 Gross per 24 hour  Intake 300 ml  Output 1100 ml  Net -800 ml   Filed Weights   07/01/17 0329 07/02/17 0244 07/03/17 0500  Weight: 117.4 kg (258 lb 13.1 oz) 116.5 kg (256 lb 14.4 oz) 115.8 kg (255 lb 4.7 oz)    General appearance: alert, cooperative, appears stated age and no distress Head: Normocephalic, without obvious abnormality, atraumatic Resp: Coarse breath sounds bilaterally.  No wheezing or rhonchi.  Normal effort at rest. Cardio: regular rate and rhythm, S1, S2 normal, no murmur, click, rub or gallop GI: soft, non-tender; bowel sounds normal; no masses,  no organomegaly Extremities: extremities normal, atraumatic, no cyanosis or edema Neurologic: No obvious focal neurological deficits.  Lab Results:  Data Reviewed: I have personally reviewed following labs and imaging studies  CBC: Recent Labs  Lab 06/26/17 1633  06/29/17 0258 06/30/17 0241 07/01/17 0231 07/02/17 0331 07/03/17 0335  WBC 9.1   < > 11.3* 14.6* 9.7 8.4 7.8  NEUTROABS 6.6  --   --   --   --   --   --  HGB 12.5   < > 11.7* 12.1 11.3* 11.3* 11.3*  HCT 40.8   < > 37.9 39.2 36.5 37.4 36.5  MCV 95.3   < > 95.0 94.2 94.3 95.9 96.1  PLT 240   < > 253 271 298 285 286   < > = values in this interval not displayed.    Basic Metabolic Panel: Recent Labs  Lab 06/29/17 0258 06/30/17 0241 07/01/17 0231 07/02/17 0331 07/03/17 0335  NA 139 139 138 140 140  K 4.2 4.5 5.3* 4.1 4.6  CL 93* 93* 95* 97* 96*  CO2 35* 36* 33* 36* 34*  GLUCOSE 190* 114* 100* 88 92    BUN 14 18 28* 26* 28*  CREATININE 0.99 1.04* 1.05* 1.02* 1.08*  CALCIUM 9.1 9.0 8.8* 8.7* 8.7*    GFR: Estimated Creatinine Clearance: 59.5 mL/min (A) (by C-G formula based on SCr of 1.08 mg/dL (H)).  Liver Function Tests: Recent Labs  Lab 06/27/17 0218  AST 28  ALT 14  ALKPHOS 64  BILITOT 0.8  PROT 6.9  ALBUMIN 3.9     Recent Labs  Lab 06/27/17 1822  AMMONIA 19    Cardiac Enzymes: Recent Labs  Lab 06/27/17 0754  TROPONINI <0.03    Recent Results (from the past 240 hour(s))  Urine culture     Status: Abnormal   Collection Time: 06/27/17  6:13 AM  Result Value Ref Range Status   Specimen Description URINE, CLEAN CATCH  Final   Special Requests   Final    NONE Performed at [facility], [address], [place], [place] [address]    Culture MULTIPLE SPECIES PRESENT, SUGGEST RECOLLECTION (A)  Final   Report Status 06/28/2017 FINAL  Final  Respiratory Panel by PCR     Status: Abnormal   Collection Time: 06/27/17  6:20 AM  Result Value Ref Range Status   Adenovirus DETECTED (A) NOT DETECTED Final   Coronavirus 229E NOT DETECTED NOT DETECTED Final   Coronavirus HKU1 NOT DETECTED NOT DETECTED Final   Coronavirus NL63 NOT DETECTED NOT DETECTED Final   Coronavirus OC43 NOT DETECTED NOT DETECTED Final   Metapneumovirus NOT DETECTED NOT DETECTED Final   Rhinovirus / Enterovirus NOT DETECTED NOT DETECTED Final   Influenza A NOT DETECTED NOT DETECTED Final   Influenza B NOT DETECTED NOT DETECTED Final   Parainfluenza Virus 1 NOT DETECTED NOT DETECTED Final   Parainfluenza Virus 2 NOT DETECTED NOT DETECTED Final   Parainfluenza Virus 3 NOT DETECTED NOT DETECTED Final   Parainfluenza Virus 4 NOT DETECTED NOT DETECTED Final   Respiratory Syncytial Virus NOT DETECTED NOT DETECTED Final   Bordetella pertussis NOT DETECTED NOT DETECTED Final   Chlamydophila pneumoniae NOT DETECTED NOT DETECTED Final   Mycoplasma pneumoniae NOT DETECTED NOT DETECTED Final     Comment: Performed at [facility], [address], [place], [place] [address]  Culture, expectorated sputum-assessment     Status: None   Collection Time: 06/28/17  8:01 AM  Result Value Ref Range Status   Specimen Description SPUTUM  Final   Special Requests NONE  Final   Sputum evaluation   Final    Sputum specimen not acceptable for testing.  Please recollect.   Gram Stain Report Called to,Read Back By and Verified With: C DILLARD,RN AT 0945 06/28/17 BY L BENFIELD Performed at [facility], [address], [place], [place] [address]    Report Status 06/28/2017 FINAL  Final  Radiology Studies: No results found.   Medications:  Scheduled: . benzonatate  100 mg Oral TID  . DULoxetine  30 mg Oral BID  . enoxaparin (LOVENOX) injection  60 mg Subcutaneous Q24H  . fluticasone  1 spray Each Nare Daily  . guaiFENesin  600 mg Oral BID  . levothyroxine  75 mcg Oral QAC breakfast  . lisinopril  10 mg Oral Daily  . loratadine  10 mg Oral Daily  . mouth rinse  15 mL Mouth Rinse BID  . metoprolol succinate  50 mg Oral Daily  . mometasone-formoterol  2 puff Inhalation BID  . pantoprazole  40 mg Oral Daily  . pravastatin  20 mg Oral QHS  . predniSONE  40 mg Oral Q breakfast  . pregabalin  100 mg Oral BID  . traZODone  300 mg Oral QHS  . vitamin B-12  1,000 mcg Oral Daily   Continuous:  WUJ:[id number] **OR** acetaminophen, guaiFENesin-dextromethorphan, hydrALAZINE, ipratropium-albuterol, loperamide, ondansetron **OR** ondansetron (ZOFRAN) IV, traMADol  Assessment/Plan:    Acute on chronic respiratory failure with hypoxia Most likely secondary to acute bronchitis.  There is a questionable history of underlying COPD.  Patient is being evaluated by pulmonology as outpatient.  Patient uses oxygen at home.  Respiratory viral panel was positive for adenovirus.  Patient's respiratory status has improved significantly and she is now back to baseline.  Echocardiogram with  normal systolic function with grade 1 diastolic dysfunction.  Steroids to be discontinued after 2 more days.  Morbid obesity Body mass index is 45.22 kg/m.  Acute metabolic encephalopathy in the setting of bipolar disorder and UTI Mental status now back to baseline.  Continue home medications.  Urinary tract infection Urine cultures without any growth of specific organism.  Patient treated with ceftriaxone for 3 days.  Right ankle pain in the setting of recent fall Ankle x-ray showed small avulsed fragment of the anterior distal talus.  This was discussed with orthopedics who recommended a cam walker boot and weightbearing as tolerated.  Follow-up with Dr. [name] 1 week after discharge.  Benign essential hypertension Continue Toprol-XL and lisinopril.  Stable.  Hypothyroidism Continue Synthroid.  TSH was 2.9.  Chronic diastolic dysfunction Appears to be euvolemic.  Ascending aortic aneurysm Seen on CT scan as well as echocardiogram.  Measured to be 4.2 cm.  No changes from previous.  Continued monitoring as outpatient.  ADDENDUM Called by social worker that they do have insurance authorization for patient to go to skilled nursing facility for rehab.  No changes to the discharge summary from yesterday done by Dr. Isidoro Donning.  Okay for discharge today.  DVT Prophylaxis: Enoxaparin    Code Status: Full code Family Communication: Discussed with the patient Disposition Plan: Management as outlined above.  Await placement to skilled nursing facility for short-term rehab.    LOS: 4 days   Osvaldo Shipper  [facility] Pager [phone] 07/03/2017, 10:46 AM  If 7PM-7AM, please contact night-coverage at [email or web address], password [facility]

## 2017-07-05 DIAGNOSIS — J9601 Acute respiratory failure with hypoxia: Secondary | ICD-10-CM | POA: Diagnosis not present

## 2017-07-05 DIAGNOSIS — I10 Essential (primary) hypertension: Secondary | ICD-10-CM | POA: Diagnosis not present

## 2017-07-05 DIAGNOSIS — F3175 Bipolar disorder, in partial remission, most recent episode depressed: Secondary | ICD-10-CM | POA: Diagnosis not present

## 2017-07-08 DIAGNOSIS — M4800 Spinal stenosis, site unspecified: Secondary | ICD-10-CM | POA: Diagnosis not present

## 2017-07-09 ENCOUNTER — Telehealth: Payer: Self-pay | Admitting: Cardiology

## 2017-07-09 DIAGNOSIS — M4800 Spinal stenosis, site unspecified: Secondary | ICD-10-CM | POA: Diagnosis not present

## 2017-07-09 NOTE — Telephone Encounter (Signed)
LMOVM requesting that pt send manual transmission b/c home monitor has not updated in at least 14 days.    

## 2017-07-10 DIAGNOSIS — M4800 Spinal stenosis, site unspecified: Secondary | ICD-10-CM | POA: Diagnosis not present

## 2017-07-11 DIAGNOSIS — M4800 Spinal stenosis, site unspecified: Secondary | ICD-10-CM | POA: Diagnosis not present

## 2017-07-12 DIAGNOSIS — M4800 Spinal stenosis, site unspecified: Secondary | ICD-10-CM | POA: Diagnosis not present

## 2017-07-13 DIAGNOSIS — M4800 Spinal stenosis, site unspecified: Secondary | ICD-10-CM | POA: Diagnosis not present

## 2017-07-14 DIAGNOSIS — M4800 Spinal stenosis, site unspecified: Secondary | ICD-10-CM | POA: Diagnosis not present

## 2017-07-15 DIAGNOSIS — M4800 Spinal stenosis, site unspecified: Secondary | ICD-10-CM | POA: Diagnosis not present

## 2017-07-16 ENCOUNTER — Encounter: Payer: Self-pay | Admitting: Cardiology

## 2017-07-16 DIAGNOSIS — M4800 Spinal stenosis, site unspecified: Secondary | ICD-10-CM | POA: Diagnosis not present

## 2017-07-17 DIAGNOSIS — M6281 Muscle weakness (generalized): Secondary | ICD-10-CM | POA: Diagnosis not present

## 2017-07-17 DIAGNOSIS — I5032 Chronic diastolic (congestive) heart failure: Secondary | ICD-10-CM | POA: Diagnosis not present

## 2017-07-17 DIAGNOSIS — I10 Essential (primary) hypertension: Secondary | ICD-10-CM | POA: Diagnosis not present

## 2017-07-17 DIAGNOSIS — R2689 Other abnormalities of gait and mobility: Secondary | ICD-10-CM | POA: Diagnosis not present

## 2017-07-17 DIAGNOSIS — E039 Hypothyroidism, unspecified: Secondary | ICD-10-CM | POA: Diagnosis not present

## 2017-07-17 DIAGNOSIS — M4800 Spinal stenosis, site unspecified: Secondary | ICD-10-CM | POA: Diagnosis not present

## 2017-07-18 DIAGNOSIS — M4800 Spinal stenosis, site unspecified: Secondary | ICD-10-CM | POA: Diagnosis not present

## 2017-07-19 DIAGNOSIS — M4800 Spinal stenosis, site unspecified: Secondary | ICD-10-CM | POA: Diagnosis not present

## 2017-07-20 DIAGNOSIS — M4800 Spinal stenosis, site unspecified: Secondary | ICD-10-CM | POA: Diagnosis not present

## 2017-07-21 DIAGNOSIS — M4800 Spinal stenosis, site unspecified: Secondary | ICD-10-CM | POA: Diagnosis not present

## 2017-07-22 DIAGNOSIS — J410 Simple chronic bronchitis: Secondary | ICD-10-CM | POA: Diagnosis not present

## 2017-07-22 DIAGNOSIS — E662 Morbid (severe) obesity with alveolar hypoventilation: Secondary | ICD-10-CM | POA: Diagnosis not present

## 2017-07-22 DIAGNOSIS — M4647 Discitis, unspecified, lumbosacral region: Secondary | ICD-10-CM | POA: Diagnosis not present

## 2017-07-22 DIAGNOSIS — M4800 Spinal stenosis, site unspecified: Secondary | ICD-10-CM | POA: Diagnosis not present

## 2017-07-22 DIAGNOSIS — J9611 Chronic respiratory failure with hypoxia: Secondary | ICD-10-CM | POA: Diagnosis not present

## 2017-07-23 DIAGNOSIS — M4800 Spinal stenosis, site unspecified: Secondary | ICD-10-CM | POA: Diagnosis not present

## 2017-07-24 DIAGNOSIS — M4800 Spinal stenosis, site unspecified: Secondary | ICD-10-CM | POA: Diagnosis not present

## 2017-07-24 LAB — CUP PACEART REMOTE DEVICE CHECK
Date Time Interrogation Session: 20190525183646
Implantable Pulse Generator Implant Date: 20180420

## 2017-07-25 DIAGNOSIS — M4800 Spinal stenosis, site unspecified: Secondary | ICD-10-CM | POA: Diagnosis not present

## 2017-07-26 DIAGNOSIS — K5730 Diverticulosis of large intestine without perforation or abscess without bleeding: Secondary | ICD-10-CM | POA: Diagnosis not present

## 2017-07-26 DIAGNOSIS — E785 Hyperlipidemia, unspecified: Secondary | ICD-10-CM | POA: Diagnosis not present

## 2017-07-26 DIAGNOSIS — Z9181 History of falling: Secondary | ICD-10-CM | POA: Diagnosis not present

## 2017-07-26 DIAGNOSIS — M4800 Spinal stenosis, site unspecified: Secondary | ICD-10-CM | POA: Diagnosis not present

## 2017-07-26 DIAGNOSIS — E662 Morbid (severe) obesity with alveolar hypoventilation: Secondary | ICD-10-CM | POA: Diagnosis not present

## 2017-07-26 DIAGNOSIS — Z9981 Dependence on supplemental oxygen: Secondary | ICD-10-CM | POA: Diagnosis not present

## 2017-07-26 DIAGNOSIS — F250 Schizoaffective disorder, bipolar type: Secondary | ICD-10-CM | POA: Diagnosis not present

## 2017-07-26 DIAGNOSIS — I714 Abdominal aortic aneurysm, without rupture: Secondary | ICD-10-CM | POA: Diagnosis not present

## 2017-07-26 DIAGNOSIS — G4700 Insomnia, unspecified: Secondary | ICD-10-CM | POA: Diagnosis not present

## 2017-07-26 DIAGNOSIS — M80071D Age-related osteoporosis with current pathological fracture, right ankle and foot, subsequent encounter for fracture with routine healing: Secondary | ICD-10-CM | POA: Diagnosis not present

## 2017-07-26 DIAGNOSIS — K219 Gastro-esophageal reflux disease without esophagitis: Secondary | ICD-10-CM | POA: Diagnosis not present

## 2017-07-26 DIAGNOSIS — J9621 Acute and chronic respiratory failure with hypoxia: Secondary | ICD-10-CM | POA: Diagnosis not present

## 2017-07-26 DIAGNOSIS — I119 Hypertensive heart disease without heart failure: Secondary | ICD-10-CM | POA: Diagnosis not present

## 2017-07-27 DIAGNOSIS — M4800 Spinal stenosis, site unspecified: Secondary | ICD-10-CM | POA: Diagnosis not present

## 2017-07-28 DIAGNOSIS — M4800 Spinal stenosis, site unspecified: Secondary | ICD-10-CM | POA: Diagnosis not present

## 2017-07-29 DIAGNOSIS — I714 Abdominal aortic aneurysm, without rupture: Secondary | ICD-10-CM | POA: Diagnosis not present

## 2017-07-29 DIAGNOSIS — F250 Schizoaffective disorder, bipolar type: Secondary | ICD-10-CM | POA: Diagnosis not present

## 2017-07-29 DIAGNOSIS — K5730 Diverticulosis of large intestine without perforation or abscess without bleeding: Secondary | ICD-10-CM | POA: Diagnosis not present

## 2017-07-29 DIAGNOSIS — I119 Hypertensive heart disease without heart failure: Secondary | ICD-10-CM | POA: Diagnosis not present

## 2017-07-29 DIAGNOSIS — J9621 Acute and chronic respiratory failure with hypoxia: Secondary | ICD-10-CM | POA: Diagnosis not present

## 2017-07-29 DIAGNOSIS — M80071D Age-related osteoporosis with current pathological fracture, right ankle and foot, subsequent encounter for fracture with routine healing: Secondary | ICD-10-CM | POA: Diagnosis not present

## 2017-07-29 DIAGNOSIS — M4800 Spinal stenosis, site unspecified: Secondary | ICD-10-CM | POA: Diagnosis not present

## 2017-07-30 DIAGNOSIS — I712 Thoracic aortic aneurysm, without rupture: Secondary | ICD-10-CM | POA: Diagnosis not present

## 2017-07-30 DIAGNOSIS — J9691 Respiratory failure, unspecified with hypoxia: Secondary | ICD-10-CM | POA: Diagnosis not present

## 2017-07-30 DIAGNOSIS — N183 Chronic kidney disease, stage 3 (moderate): Secondary | ICD-10-CM | POA: Diagnosis not present

## 2017-07-30 DIAGNOSIS — I119 Hypertensive heart disease without heart failure: Secondary | ICD-10-CM | POA: Diagnosis not present

## 2017-07-30 DIAGNOSIS — M797 Fibromyalgia: Secondary | ICD-10-CM | POA: Diagnosis not present

## 2017-07-30 DIAGNOSIS — E039 Hypothyroidism, unspecified: Secondary | ICD-10-CM | POA: Diagnosis not present

## 2017-07-30 DIAGNOSIS — I129 Hypertensive chronic kidney disease with stage 1 through stage 4 chronic kidney disease, or unspecified chronic kidney disease: Secondary | ICD-10-CM | POA: Diagnosis not present

## 2017-07-30 DIAGNOSIS — F250 Schizoaffective disorder, bipolar type: Secondary | ICD-10-CM | POA: Diagnosis not present

## 2017-07-30 DIAGNOSIS — F319 Bipolar disorder, unspecified: Secondary | ICD-10-CM | POA: Diagnosis not present

## 2017-07-30 DIAGNOSIS — J9621 Acute and chronic respiratory failure with hypoxia: Secondary | ICD-10-CM | POA: Diagnosis not present

## 2017-07-30 DIAGNOSIS — M80071D Age-related osteoporosis with current pathological fracture, right ankle and foot, subsequent encounter for fracture with routine healing: Secondary | ICD-10-CM | POA: Diagnosis not present

## 2017-07-30 DIAGNOSIS — Z6841 Body Mass Index (BMI) 40.0 and over, adult: Secondary | ICD-10-CM | POA: Diagnosis not present

## 2017-07-30 DIAGNOSIS — E662 Morbid (severe) obesity with alveolar hypoventilation: Secondary | ICD-10-CM | POA: Diagnosis not present

## 2017-07-30 DIAGNOSIS — I714 Abdominal aortic aneurysm, without rupture: Secondary | ICD-10-CM | POA: Diagnosis not present

## 2017-07-30 DIAGNOSIS — J449 Chronic obstructive pulmonary disease, unspecified: Secondary | ICD-10-CM | POA: Diagnosis not present

## 2017-07-30 DIAGNOSIS — S92901A Unspecified fracture of right foot, initial encounter for closed fracture: Secondary | ICD-10-CM | POA: Diagnosis not present

## 2017-07-30 DIAGNOSIS — K5730 Diverticulosis of large intestine without perforation or abscess without bleeding: Secondary | ICD-10-CM | POA: Diagnosis not present

## 2017-07-30 DIAGNOSIS — M4800 Spinal stenosis, site unspecified: Secondary | ICD-10-CM | POA: Diagnosis not present

## 2017-07-31 DIAGNOSIS — S92154A Nondisplaced avulsion fracture (chip fracture) of right talus, initial encounter for closed fracture: Secondary | ICD-10-CM | POA: Diagnosis not present

## 2017-07-31 DIAGNOSIS — S92101A Unspecified fracture of right talus, initial encounter for closed fracture: Secondary | ICD-10-CM | POA: Insufficient documentation

## 2017-07-31 DIAGNOSIS — M4800 Spinal stenosis, site unspecified: Secondary | ICD-10-CM | POA: Diagnosis not present

## 2017-08-01 ENCOUNTER — Ambulatory Visit (INDEPENDENT_AMBULATORY_CARE_PROVIDER_SITE_OTHER): Payer: Medicare HMO | Admitting: *Deleted

## 2017-08-01 DIAGNOSIS — J9621 Acute and chronic respiratory failure with hypoxia: Secondary | ICD-10-CM | POA: Diagnosis not present

## 2017-08-01 DIAGNOSIS — R55 Syncope and collapse: Secondary | ICD-10-CM | POA: Diagnosis not present

## 2017-08-01 DIAGNOSIS — K5730 Diverticulosis of large intestine without perforation or abscess without bleeding: Secondary | ICD-10-CM | POA: Diagnosis not present

## 2017-08-01 DIAGNOSIS — I714 Abdominal aortic aneurysm, without rupture: Secondary | ICD-10-CM | POA: Diagnosis not present

## 2017-08-01 DIAGNOSIS — M80071D Age-related osteoporosis with current pathological fracture, right ankle and foot, subsequent encounter for fracture with routine healing: Secondary | ICD-10-CM | POA: Diagnosis not present

## 2017-08-01 DIAGNOSIS — M4800 Spinal stenosis, site unspecified: Secondary | ICD-10-CM | POA: Diagnosis not present

## 2017-08-01 DIAGNOSIS — I119 Hypertensive heart disease without heart failure: Secondary | ICD-10-CM | POA: Diagnosis not present

## 2017-08-01 DIAGNOSIS — F250 Schizoaffective disorder, bipolar type: Secondary | ICD-10-CM | POA: Diagnosis not present

## 2017-08-02 DIAGNOSIS — M4800 Spinal stenosis, site unspecified: Secondary | ICD-10-CM | POA: Diagnosis not present

## 2017-08-02 DIAGNOSIS — M810 Age-related osteoporosis without current pathological fracture: Secondary | ICD-10-CM | POA: Diagnosis not present

## 2017-08-02 NOTE — Progress Notes (Signed)
Carelink Summary Report / Loop Recorder 

## 2017-08-03 DIAGNOSIS — M4800 Spinal stenosis, site unspecified: Secondary | ICD-10-CM | POA: Diagnosis not present

## 2017-08-04 DIAGNOSIS — M4800 Spinal stenosis, site unspecified: Secondary | ICD-10-CM | POA: Diagnosis not present

## 2017-08-05 DIAGNOSIS — I119 Hypertensive heart disease without heart failure: Secondary | ICD-10-CM | POA: Diagnosis not present

## 2017-08-05 DIAGNOSIS — F250 Schizoaffective disorder, bipolar type: Secondary | ICD-10-CM | POA: Diagnosis not present

## 2017-08-05 DIAGNOSIS — J9621 Acute and chronic respiratory failure with hypoxia: Secondary | ICD-10-CM | POA: Diagnosis not present

## 2017-08-05 DIAGNOSIS — M4800 Spinal stenosis, site unspecified: Secondary | ICD-10-CM | POA: Diagnosis not present

## 2017-08-05 DIAGNOSIS — K5730 Diverticulosis of large intestine without perforation or abscess without bleeding: Secondary | ICD-10-CM | POA: Diagnosis not present

## 2017-08-05 DIAGNOSIS — I714 Abdominal aortic aneurysm, without rupture: Secondary | ICD-10-CM | POA: Diagnosis not present

## 2017-08-05 DIAGNOSIS — M80071D Age-related osteoporosis with current pathological fracture, right ankle and foot, subsequent encounter for fracture with routine healing: Secondary | ICD-10-CM | POA: Diagnosis not present

## 2017-08-06 DIAGNOSIS — M4800 Spinal stenosis, site unspecified: Secondary | ICD-10-CM | POA: Diagnosis not present

## 2017-08-07 DIAGNOSIS — F250 Schizoaffective disorder, bipolar type: Secondary | ICD-10-CM | POA: Diagnosis not present

## 2017-08-07 DIAGNOSIS — M80071D Age-related osteoporosis with current pathological fracture, right ankle and foot, subsequent encounter for fracture with routine healing: Secondary | ICD-10-CM | POA: Diagnosis not present

## 2017-08-07 DIAGNOSIS — J9621 Acute and chronic respiratory failure with hypoxia: Secondary | ICD-10-CM | POA: Diagnosis not present

## 2017-08-07 DIAGNOSIS — I714 Abdominal aortic aneurysm, without rupture: Secondary | ICD-10-CM | POA: Diagnosis not present

## 2017-08-07 DIAGNOSIS — K5730 Diverticulosis of large intestine without perforation or abscess without bleeding: Secondary | ICD-10-CM | POA: Diagnosis not present

## 2017-08-07 DIAGNOSIS — M4800 Spinal stenosis, site unspecified: Secondary | ICD-10-CM | POA: Diagnosis not present

## 2017-08-07 DIAGNOSIS — I119 Hypertensive heart disease without heart failure: Secondary | ICD-10-CM | POA: Diagnosis not present

## 2017-08-08 DIAGNOSIS — M4800 Spinal stenosis, site unspecified: Secondary | ICD-10-CM | POA: Diagnosis not present

## 2017-08-09 DIAGNOSIS — I714 Abdominal aortic aneurysm, without rupture: Secondary | ICD-10-CM | POA: Diagnosis not present

## 2017-08-09 DIAGNOSIS — I119 Hypertensive heart disease without heart failure: Secondary | ICD-10-CM | POA: Diagnosis not present

## 2017-08-09 DIAGNOSIS — K5730 Diverticulosis of large intestine without perforation or abscess without bleeding: Secondary | ICD-10-CM | POA: Diagnosis not present

## 2017-08-09 DIAGNOSIS — J9621 Acute and chronic respiratory failure with hypoxia: Secondary | ICD-10-CM | POA: Diagnosis not present

## 2017-08-09 DIAGNOSIS — M4800 Spinal stenosis, site unspecified: Secondary | ICD-10-CM | POA: Diagnosis not present

## 2017-08-09 DIAGNOSIS — F250 Schizoaffective disorder, bipolar type: Secondary | ICD-10-CM | POA: Diagnosis not present

## 2017-08-09 DIAGNOSIS — M80071D Age-related osteoporosis with current pathological fracture, right ankle and foot, subsequent encounter for fracture with routine healing: Secondary | ICD-10-CM | POA: Diagnosis not present

## 2017-08-10 DIAGNOSIS — M4800 Spinal stenosis, site unspecified: Secondary | ICD-10-CM | POA: Diagnosis not present

## 2017-08-11 DIAGNOSIS — M4800 Spinal stenosis, site unspecified: Secondary | ICD-10-CM | POA: Diagnosis not present

## 2017-08-12 DIAGNOSIS — I119 Hypertensive heart disease without heart failure: Secondary | ICD-10-CM | POA: Diagnosis not present

## 2017-08-12 DIAGNOSIS — M4800 Spinal stenosis, site unspecified: Secondary | ICD-10-CM | POA: Diagnosis not present

## 2017-08-12 DIAGNOSIS — K5730 Diverticulosis of large intestine without perforation or abscess without bleeding: Secondary | ICD-10-CM | POA: Diagnosis not present

## 2017-08-12 DIAGNOSIS — I714 Abdominal aortic aneurysm, without rupture: Secondary | ICD-10-CM | POA: Diagnosis not present

## 2017-08-12 DIAGNOSIS — M80071D Age-related osteoporosis with current pathological fracture, right ankle and foot, subsequent encounter for fracture with routine healing: Secondary | ICD-10-CM | POA: Diagnosis not present

## 2017-08-12 DIAGNOSIS — J9621 Acute and chronic respiratory failure with hypoxia: Secondary | ICD-10-CM | POA: Diagnosis not present

## 2017-08-12 DIAGNOSIS — F250 Schizoaffective disorder, bipolar type: Secondary | ICD-10-CM | POA: Diagnosis not present

## 2017-08-13 ENCOUNTER — Telehealth: Payer: Self-pay | Admitting: Cardiology

## 2017-08-13 DIAGNOSIS — F250 Schizoaffective disorder, bipolar type: Secondary | ICD-10-CM | POA: Diagnosis not present

## 2017-08-13 DIAGNOSIS — M4800 Spinal stenosis, site unspecified: Secondary | ICD-10-CM | POA: Diagnosis not present

## 2017-08-13 DIAGNOSIS — I119 Hypertensive heart disease without heart failure: Secondary | ICD-10-CM | POA: Diagnosis not present

## 2017-08-13 DIAGNOSIS — K5730 Diverticulosis of large intestine without perforation or abscess without bleeding: Secondary | ICD-10-CM | POA: Diagnosis not present

## 2017-08-13 DIAGNOSIS — I714 Abdominal aortic aneurysm, without rupture: Secondary | ICD-10-CM | POA: Diagnosis not present

## 2017-08-13 DIAGNOSIS — M80071D Age-related osteoporosis with current pathological fracture, right ankle and foot, subsequent encounter for fracture with routine healing: Secondary | ICD-10-CM | POA: Diagnosis not present

## 2017-08-13 DIAGNOSIS — J9621 Acute and chronic respiratory failure with hypoxia: Secondary | ICD-10-CM | POA: Diagnosis not present

## 2017-08-13 NOTE — Telephone Encounter (Signed)
LMOVM requesting that pt send manual transmission b/c home monitor has not updated in at least 14 days.    

## 2017-08-14 ENCOUNTER — Ambulatory Visit (INDEPENDENT_AMBULATORY_CARE_PROVIDER_SITE_OTHER): Payer: Medicare HMO | Admitting: Pulmonary Disease

## 2017-08-14 ENCOUNTER — Encounter: Payer: Self-pay | Admitting: Pulmonary Disease

## 2017-08-14 VITALS — BP 114/68 | HR 84 | Ht 63.0 in | Wt 268.0 lb

## 2017-08-14 DIAGNOSIS — R0609 Other forms of dyspnea: Secondary | ICD-10-CM | POA: Diagnosis not present

## 2017-08-14 DIAGNOSIS — E662 Morbid (severe) obesity with alveolar hypoventilation: Secondary | ICD-10-CM | POA: Diagnosis not present

## 2017-08-14 DIAGNOSIS — I119 Hypertensive heart disease without heart failure: Secondary | ICD-10-CM | POA: Diagnosis not present

## 2017-08-14 DIAGNOSIS — M4800 Spinal stenosis, site unspecified: Secondary | ICD-10-CM | POA: Diagnosis not present

## 2017-08-14 DIAGNOSIS — F250 Schizoaffective disorder, bipolar type: Secondary | ICD-10-CM | POA: Diagnosis not present

## 2017-08-14 DIAGNOSIS — M80071D Age-related osteoporosis with current pathological fracture, right ankle and foot, subsequent encounter for fracture with routine healing: Secondary | ICD-10-CM | POA: Diagnosis not present

## 2017-08-14 DIAGNOSIS — I714 Abdominal aortic aneurysm, without rupture: Secondary | ICD-10-CM | POA: Diagnosis not present

## 2017-08-14 DIAGNOSIS — Z6841 Body Mass Index (BMI) 40.0 and over, adult: Secondary | ICD-10-CM | POA: Diagnosis not present

## 2017-08-14 DIAGNOSIS — J9611 Chronic respiratory failure with hypoxia: Secondary | ICD-10-CM | POA: Diagnosis not present

## 2017-08-14 DIAGNOSIS — R0600 Dyspnea, unspecified: Secondary | ICD-10-CM

## 2017-08-14 DIAGNOSIS — J9621 Acute and chronic respiratory failure with hypoxia: Secondary | ICD-10-CM | POA: Diagnosis not present

## 2017-08-14 DIAGNOSIS — K5730 Diverticulosis of large intestine without perforation or abscess without bleeding: Secondary | ICD-10-CM | POA: Diagnosis not present

## 2017-08-14 NOTE — Progress Notes (Signed)
[facility]  Chief Complaint  Patient presents with  . Hospitalization Follow-up    Pt was admitted in [facility] for bronchitis, and could not breath well. Pt has some SOB with exertion. Pt is requesting POC with [facility]    Vital signs: BP 114/68 (BP Location: Right Arm, Patient Position: Sitting)   Pulse 84   Ht 5\' 3"  (1.6 m)   Wt 268 lb (121.6 kg)   SpO2 95%   BMI 47.47 kg/m   History of Present Illness: Lindsey Rowe is a 72 y.o. female with cough, OHS, chronic respiratory failure, and dyspnea on exertion.  She was in hospital for asthmatic bronchitis.  Breathing better now.  Not having cough, wheeze, sputum, chest pain/tightness.  Was using dulera in hospital, but only has albuterol now.  Doesn't need albuterol much.  Uses 2 liters 24/7.  Sleep study didn't show significant sleep apnea.  Walks with a walker.  It is hard for her to carry oxygen tanks.   Comprehensive Respiratory Exam:  Appearance - well kempt, wearing oxygen ENMT - nasal mucosa moist, turbinates clear, midline nasal septum, no dental lesions, no gingival bleeding, no oral exudates, no tonsillar hypertrophy Neck - no masses, trachea midline, no thyromegaly, no elevation in JVP Respiratory - decreased breath sounds, normal appearance of chest wall, normal respiratory effort w/o accessory muscle use, no dullness on percussion, no wheezing or rales CV - s1s2 regular rate and rhythm, no murmurs, 1+ peripheral edema, radial pulses symmetric GI - soft, non tender Lymph - no adenopathy noted in neck and axillary areas MSK - normal muscle strength and tone, walks with a walker Ext - no cyanosis, clubbing, or joint inflammation noted Skin - no rashes, lesions, or ulcers Neuro - oriented to person, place, and time Psych - normal mood and affect   Assessment/Plan:  Chronic hypoxic respiratory failure from obesity hypoventilation syndrome. - 2 liters oxygen 24/7 - will arrange  for POC  Morbid obesity. - discussed options to assist with weight loss  Asthmatic bronchitis. - will arrange for PFT to assess for COPD - continue prn albuterol   Patient Instructions  Will arrange for portable oxygen concentrator  Will arrange for pulmonary function test  Follow up in 4 months    Coralyn Helling, MD [facility] Pulmonary/Critical Care 08/14/2017, 2:00 PM Pager:  [phone]  Flow Sheet  Pulmonary tests: HRCT chest 09/25/16 >> elevated Rt diaphragm, patchy air trapping, atherosclerosis SNIFF test 10/26/16 >> elevated rt diaphragm, no paradoxical motion  Sleep tests: PSG 05/13/17 >> AHI 0.6, SpO2 low 90%; used 2 liters oxygen  Cardiac tests: Echo 06/28/17 >> mild LVH, EF 60 to 65%, grade 1 DD, 43 mm ascending aorta  Past Medical History: She  has a past medical history of Arthritis, Back pain, Bipolar 1 disorder (HCC), Complication of anesthesia, Depression, GERD (gastroesophageal reflux disease), Headache(784.0), Hyperlipidemia, Hypertension, Hypothyroidism, and Thyroid disease.  Past Surgical History: She  has a past surgical history that includes Appendectomy; Gastric bypass; Back surgery; Tonsillectomy; Wrist surgery; Eye surgery (Bilateral); Nasal sinus surgery (Bilateral); Lumbar laminectomy/decompression microdiscectomy (Right, 07/23/2012); and LOOP RECORDER INSERTION (N/A, 05/25/2016).  Family History: Her family history includes Diabetes in her sister; Emphysema in her father; Heart disease in her mother; Other in her brother.  Social History: She  reports that she has never smoked. She has never used smokeless tobacco. She reports that she drinks alcohol. She reports that she does not use drugs.  Medications: Allergies as of 08/14/2017  Reactions   Gabapentin Other (See Comments)   Right foot and leg swelled    Cefadroxil Itching   Ends of hair itched       Medication List        Accurate as of 08/14/17  2:00 PM. Always use your most recent  med list.          albuterol 108 (90 Base) MCG/ACT inhaler Commonly known as:  PROVENTIL HFA;VENTOLIN HFA Inhale 2 puffs into the lungs every 6 (six) hours as needed for wheezing or shortness of breath.   BC HEADACHE POWDER PO Take 1 packet by mouth as needed (headache, pain).   D3-1000 1000 units capsule Generic drug:  Cholecalciferol Take 1,000 Units by mouth daily.   DULoxetine 30 MG capsule Commonly known as:  CYMBALTA Take 1 capsule (30 mg total) by mouth 2 (two) times daily.   fluticasone 50 MCG/ACT nasal spray Commonly known as:  FLONASE Place 1 spray into both nostrils daily as needed for allergies or rhinitis.   levothyroxine 75 MCG tablet Commonly known as:  SYNTHROID, LEVOTHROID Take 75 mcg by mouth daily before breakfast.   lisinopril 10 MG tablet Commonly known as:  PRINIVIL,ZESTRIL Take 10 mg by mouth daily.   loperamide 2 MG capsule Commonly known as:  IMODIUM Take 1 capsule (2 mg total) by mouth 3 (three) times daily as needed for diarrhea or loose stools.   metoprolol succinate 50 MG 24 hr tablet Commonly known as:  TOPROL-XL Take 1 tablet (50 mg total) by mouth daily. Take with or immediately following a meal.   omeprazole 20 MG capsule Commonly known as:  PRILOSEC Take 20 mg by mouth 2 (two) times daily before a meal.   pravastatin 20 MG tablet Commonly known as:  PRAVACHOL Take 20 mg by mouth at bedtime.   pregabalin 100 MG capsule Commonly known as:  LYRICA Take 1 capsule (100 mg total) by mouth 2 (two) times daily.   SUMAtriptan 100 MG tablet Commonly known as:  IMITREX Take 100 mg by mouth See admin instructions. Take 100mg  at onset of headache, may repeat in 2 hours if needed. Do not exceed 2 doses in 24 hours.   traZODone 100 MG tablet Commonly known as:  DESYREL Take 300 mg by mouth at bedtime.   vitamin B-12 1000 MCG tablet Commonly known as:  CYANOCOBALAMIN Take 1,000 mcg by mouth daily.

## 2017-08-14 NOTE — Patient Instructions (Signed)
Will arrange for portable oxygen concentrator  Will arrange for pulmonary function test  Follow up in 4 months

## 2017-08-15 DIAGNOSIS — K5730 Diverticulosis of large intestine without perforation or abscess without bleeding: Secondary | ICD-10-CM | POA: Diagnosis not present

## 2017-08-15 DIAGNOSIS — M80071D Age-related osteoporosis with current pathological fracture, right ankle and foot, subsequent encounter for fracture with routine healing: Secondary | ICD-10-CM | POA: Diagnosis not present

## 2017-08-15 DIAGNOSIS — I119 Hypertensive heart disease without heart failure: Secondary | ICD-10-CM | POA: Diagnosis not present

## 2017-08-15 DIAGNOSIS — F250 Schizoaffective disorder, bipolar type: Secondary | ICD-10-CM | POA: Diagnosis not present

## 2017-08-15 DIAGNOSIS — J9621 Acute and chronic respiratory failure with hypoxia: Secondary | ICD-10-CM | POA: Diagnosis not present

## 2017-08-15 DIAGNOSIS — I714 Abdominal aortic aneurysm, without rupture: Secondary | ICD-10-CM | POA: Diagnosis not present

## 2017-08-15 DIAGNOSIS — M4800 Spinal stenosis, site unspecified: Secondary | ICD-10-CM | POA: Diagnosis not present

## 2017-08-16 ENCOUNTER — Telehealth: Payer: Self-pay | Admitting: Pulmonary Disease

## 2017-08-16 DIAGNOSIS — I714 Abdominal aortic aneurysm, without rupture: Secondary | ICD-10-CM | POA: Diagnosis not present

## 2017-08-16 DIAGNOSIS — J9621 Acute and chronic respiratory failure with hypoxia: Secondary | ICD-10-CM | POA: Diagnosis not present

## 2017-08-16 DIAGNOSIS — F250 Schizoaffective disorder, bipolar type: Secondary | ICD-10-CM | POA: Diagnosis not present

## 2017-08-16 DIAGNOSIS — I119 Hypertensive heart disease without heart failure: Secondary | ICD-10-CM | POA: Diagnosis not present

## 2017-08-16 DIAGNOSIS — K5730 Diverticulosis of large intestine without perforation or abscess without bleeding: Secondary | ICD-10-CM | POA: Diagnosis not present

## 2017-08-16 DIAGNOSIS — M4800 Spinal stenosis, site unspecified: Secondary | ICD-10-CM | POA: Diagnosis not present

## 2017-08-16 DIAGNOSIS — M80071D Age-related osteoporosis with current pathological fracture, right ankle and foot, subsequent encounter for fracture with routine healing: Secondary | ICD-10-CM | POA: Diagnosis not present

## 2017-08-16 NOTE — Telephone Encounter (Signed)
Attempted to call pt. I did not receive an answer. Call went straight to voicemail. I have left a message for pt to return our call.  

## 2017-08-17 DIAGNOSIS — M4800 Spinal stenosis, site unspecified: Secondary | ICD-10-CM | POA: Diagnosis not present

## 2017-08-18 DIAGNOSIS — M4800 Spinal stenosis, site unspecified: Secondary | ICD-10-CM | POA: Diagnosis not present

## 2017-08-19 ENCOUNTER — Inpatient Hospital Stay: Payer: Self-pay | Admitting: Acute Care

## 2017-08-19 DIAGNOSIS — J9621 Acute and chronic respiratory failure with hypoxia: Secondary | ICD-10-CM | POA: Diagnosis not present

## 2017-08-19 DIAGNOSIS — I714 Abdominal aortic aneurysm, without rupture: Secondary | ICD-10-CM | POA: Diagnosis not present

## 2017-08-19 DIAGNOSIS — M4800 Spinal stenosis, site unspecified: Secondary | ICD-10-CM | POA: Diagnosis not present

## 2017-08-19 DIAGNOSIS — I119 Hypertensive heart disease without heart failure: Secondary | ICD-10-CM | POA: Diagnosis not present

## 2017-08-19 DIAGNOSIS — M80071D Age-related osteoporosis with current pathological fracture, right ankle and foot, subsequent encounter for fracture with routine healing: Secondary | ICD-10-CM | POA: Diagnosis not present

## 2017-08-19 DIAGNOSIS — F250 Schizoaffective disorder, bipolar type: Secondary | ICD-10-CM | POA: Diagnosis not present

## 2017-08-19 DIAGNOSIS — K5730 Diverticulosis of large intestine without perforation or abscess without bleeding: Secondary | ICD-10-CM | POA: Diagnosis not present

## 2017-08-19 NOTE — Telephone Encounter (Signed)
Attempted to call pt. I did not receive an answer. Call went straight to voicemail. I have left a message for pt to return our call.  

## 2017-08-20 DIAGNOSIS — I714 Abdominal aortic aneurysm, without rupture: Secondary | ICD-10-CM | POA: Diagnosis not present

## 2017-08-20 DIAGNOSIS — I119 Hypertensive heart disease without heart failure: Secondary | ICD-10-CM | POA: Diagnosis not present

## 2017-08-20 DIAGNOSIS — M4800 Spinal stenosis, site unspecified: Secondary | ICD-10-CM | POA: Diagnosis not present

## 2017-08-20 DIAGNOSIS — M80071D Age-related osteoporosis with current pathological fracture, right ankle and foot, subsequent encounter for fracture with routine healing: Secondary | ICD-10-CM | POA: Diagnosis not present

## 2017-08-20 DIAGNOSIS — F250 Schizoaffective disorder, bipolar type: Secondary | ICD-10-CM | POA: Diagnosis not present

## 2017-08-20 DIAGNOSIS — J9621 Acute and chronic respiratory failure with hypoxia: Secondary | ICD-10-CM | POA: Diagnosis not present

## 2017-08-20 DIAGNOSIS — K5730 Diverticulosis of large intestine without perforation or abscess without bleeding: Secondary | ICD-10-CM | POA: Diagnosis not present

## 2017-08-20 NOTE — Telephone Encounter (Signed)
Attempted to call pt. I did not receive an answer. Call went straight to voicemail. I have left a message for pt to return our call.  

## 2017-08-21 DIAGNOSIS — F250 Schizoaffective disorder, bipolar type: Secondary | ICD-10-CM | POA: Diagnosis not present

## 2017-08-21 DIAGNOSIS — J9621 Acute and chronic respiratory failure with hypoxia: Secondary | ICD-10-CM | POA: Diagnosis not present

## 2017-08-21 DIAGNOSIS — M80071D Age-related osteoporosis with current pathological fracture, right ankle and foot, subsequent encounter for fracture with routine healing: Secondary | ICD-10-CM | POA: Diagnosis not present

## 2017-08-21 DIAGNOSIS — I714 Abdominal aortic aneurysm, without rupture: Secondary | ICD-10-CM | POA: Diagnosis not present

## 2017-08-21 DIAGNOSIS — I119 Hypertensive heart disease without heart failure: Secondary | ICD-10-CM | POA: Diagnosis not present

## 2017-08-21 DIAGNOSIS — K5730 Diverticulosis of large intestine without perforation or abscess without bleeding: Secondary | ICD-10-CM | POA: Diagnosis not present

## 2017-08-21 DIAGNOSIS — M4800 Spinal stenosis, site unspecified: Secondary | ICD-10-CM | POA: Diagnosis not present

## 2017-08-21 NOTE — Telephone Encounter (Signed)
We have attempted to contact the pt several times with no success or call back from the pt. Per triage protocol, message will be closed.  

## 2017-08-22 DIAGNOSIS — M4800 Spinal stenosis, site unspecified: Secondary | ICD-10-CM | POA: Diagnosis not present

## 2017-08-22 DIAGNOSIS — F250 Schizoaffective disorder, bipolar type: Secondary | ICD-10-CM | POA: Diagnosis not present

## 2017-08-22 DIAGNOSIS — J9621 Acute and chronic respiratory failure with hypoxia: Secondary | ICD-10-CM | POA: Diagnosis not present

## 2017-08-22 DIAGNOSIS — K5730 Diverticulosis of large intestine without perforation or abscess without bleeding: Secondary | ICD-10-CM | POA: Diagnosis not present

## 2017-08-22 DIAGNOSIS — M80071D Age-related osteoporosis with current pathological fracture, right ankle and foot, subsequent encounter for fracture with routine healing: Secondary | ICD-10-CM | POA: Diagnosis not present

## 2017-08-22 DIAGNOSIS — I119 Hypertensive heart disease without heart failure: Secondary | ICD-10-CM | POA: Diagnosis not present

## 2017-08-22 DIAGNOSIS — I714 Abdominal aortic aneurysm, without rupture: Secondary | ICD-10-CM | POA: Diagnosis not present

## 2017-08-23 DIAGNOSIS — M4800 Spinal stenosis, site unspecified: Secondary | ICD-10-CM | POA: Diagnosis not present

## 2017-08-24 DIAGNOSIS — M4800 Spinal stenosis, site unspecified: Secondary | ICD-10-CM | POA: Diagnosis not present

## 2017-08-25 DIAGNOSIS — M4800 Spinal stenosis, site unspecified: Secondary | ICD-10-CM | POA: Diagnosis not present

## 2017-08-26 DIAGNOSIS — M4800 Spinal stenosis, site unspecified: Secondary | ICD-10-CM | POA: Diagnosis not present

## 2017-08-27 DIAGNOSIS — M4800 Spinal stenosis, site unspecified: Secondary | ICD-10-CM | POA: Diagnosis not present

## 2017-08-28 DIAGNOSIS — M4800 Spinal stenosis, site unspecified: Secondary | ICD-10-CM | POA: Diagnosis not present

## 2017-08-29 DIAGNOSIS — M4800 Spinal stenosis, site unspecified: Secondary | ICD-10-CM | POA: Diagnosis not present

## 2017-08-30 DIAGNOSIS — J9621 Acute and chronic respiratory failure with hypoxia: Secondary | ICD-10-CM | POA: Diagnosis not present

## 2017-08-30 DIAGNOSIS — F250 Schizoaffective disorder, bipolar type: Secondary | ICD-10-CM | POA: Diagnosis not present

## 2017-08-30 DIAGNOSIS — I714 Abdominal aortic aneurysm, without rupture: Secondary | ICD-10-CM | POA: Diagnosis not present

## 2017-08-30 DIAGNOSIS — K5730 Diverticulosis of large intestine without perforation or abscess without bleeding: Secondary | ICD-10-CM | POA: Diagnosis not present

## 2017-08-30 DIAGNOSIS — M4800 Spinal stenosis, site unspecified: Secondary | ICD-10-CM | POA: Diagnosis not present

## 2017-08-30 DIAGNOSIS — M80071D Age-related osteoporosis with current pathological fracture, right ankle and foot, subsequent encounter for fracture with routine healing: Secondary | ICD-10-CM | POA: Diagnosis not present

## 2017-08-30 DIAGNOSIS — I119 Hypertensive heart disease without heart failure: Secondary | ICD-10-CM | POA: Diagnosis not present

## 2017-08-30 LAB — CUP PACEART REMOTE DEVICE CHECK
Date Time Interrogation Session: 20190627193551
Implantable Pulse Generator Implant Date: 20180420

## 2017-08-31 DIAGNOSIS — M4800 Spinal stenosis, site unspecified: Secondary | ICD-10-CM | POA: Diagnosis not present

## 2017-09-01 DIAGNOSIS — M4800 Spinal stenosis, site unspecified: Secondary | ICD-10-CM | POA: Diagnosis not present

## 2017-09-02 DIAGNOSIS — M4800 Spinal stenosis, site unspecified: Secondary | ICD-10-CM | POA: Diagnosis not present

## 2017-09-03 ENCOUNTER — Ambulatory Visit (INDEPENDENT_AMBULATORY_CARE_PROVIDER_SITE_OTHER): Payer: Medicare HMO | Admitting: *Deleted

## 2017-09-03 DIAGNOSIS — M4800 Spinal stenosis, site unspecified: Secondary | ICD-10-CM | POA: Diagnosis not present

## 2017-09-03 DIAGNOSIS — R55 Syncope and collapse: Secondary | ICD-10-CM

## 2017-09-04 DIAGNOSIS — I714 Abdominal aortic aneurysm, without rupture: Secondary | ICD-10-CM | POA: Diagnosis not present

## 2017-09-04 DIAGNOSIS — I119 Hypertensive heart disease without heart failure: Secondary | ICD-10-CM | POA: Diagnosis not present

## 2017-09-04 DIAGNOSIS — J9621 Acute and chronic respiratory failure with hypoxia: Secondary | ICD-10-CM | POA: Diagnosis not present

## 2017-09-04 DIAGNOSIS — F250 Schizoaffective disorder, bipolar type: Secondary | ICD-10-CM | POA: Diagnosis not present

## 2017-09-04 DIAGNOSIS — K5730 Diverticulosis of large intestine without perforation or abscess without bleeding: Secondary | ICD-10-CM | POA: Diagnosis not present

## 2017-09-04 DIAGNOSIS — M4800 Spinal stenosis, site unspecified: Secondary | ICD-10-CM | POA: Diagnosis not present

## 2017-09-04 DIAGNOSIS — M80071D Age-related osteoporosis with current pathological fracture, right ankle and foot, subsequent encounter for fracture with routine healing: Secondary | ICD-10-CM | POA: Diagnosis not present

## 2017-09-04 NOTE — Progress Notes (Signed)
Carelink Summary Report / Loop Recorder 

## 2017-09-05 DIAGNOSIS — M4800 Spinal stenosis, site unspecified: Secondary | ICD-10-CM | POA: Diagnosis not present

## 2017-09-06 DIAGNOSIS — M4800 Spinal stenosis, site unspecified: Secondary | ICD-10-CM | POA: Diagnosis not present

## 2017-09-07 DIAGNOSIS — M4800 Spinal stenosis, site unspecified: Secondary | ICD-10-CM | POA: Diagnosis not present

## 2017-09-08 DIAGNOSIS — M4800 Spinal stenosis, site unspecified: Secondary | ICD-10-CM | POA: Diagnosis not present

## 2017-09-09 DIAGNOSIS — M4800 Spinal stenosis, site unspecified: Secondary | ICD-10-CM | POA: Diagnosis not present

## 2017-09-10 DIAGNOSIS — M4800 Spinal stenosis, site unspecified: Secondary | ICD-10-CM | POA: Diagnosis not present

## 2017-09-11 DIAGNOSIS — M4800 Spinal stenosis, site unspecified: Secondary | ICD-10-CM | POA: Diagnosis not present

## 2017-09-12 DIAGNOSIS — M4800 Spinal stenosis, site unspecified: Secondary | ICD-10-CM | POA: Diagnosis not present

## 2017-09-13 DIAGNOSIS — I714 Abdominal aortic aneurysm, without rupture: Secondary | ICD-10-CM | POA: Diagnosis not present

## 2017-09-13 DIAGNOSIS — H16142 Punctate keratitis, left eye: Secondary | ICD-10-CM | POA: Diagnosis not present

## 2017-09-13 DIAGNOSIS — H26491 Other secondary cataract, right eye: Secondary | ICD-10-CM | POA: Diagnosis not present

## 2017-09-13 DIAGNOSIS — K5730 Diverticulosis of large intestine without perforation or abscess without bleeding: Secondary | ICD-10-CM | POA: Diagnosis not present

## 2017-09-13 DIAGNOSIS — H01024 Squamous blepharitis left upper eyelid: Secondary | ICD-10-CM | POA: Diagnosis not present

## 2017-09-13 DIAGNOSIS — F250 Schizoaffective disorder, bipolar type: Secondary | ICD-10-CM | POA: Diagnosis not present

## 2017-09-13 DIAGNOSIS — H01022 Squamous blepharitis right lower eyelid: Secondary | ICD-10-CM | POA: Diagnosis not present

## 2017-09-13 DIAGNOSIS — M80071D Age-related osteoporosis with current pathological fracture, right ankle and foot, subsequent encounter for fracture with routine healing: Secondary | ICD-10-CM | POA: Diagnosis not present

## 2017-09-13 DIAGNOSIS — H01025 Squamous blepharitis left lower eyelid: Secondary | ICD-10-CM | POA: Diagnosis not present

## 2017-09-13 DIAGNOSIS — H35372 Puckering of macula, left eye: Secondary | ICD-10-CM | POA: Diagnosis not present

## 2017-09-13 DIAGNOSIS — M4800 Spinal stenosis, site unspecified: Secondary | ICD-10-CM | POA: Diagnosis not present

## 2017-09-13 DIAGNOSIS — J9621 Acute and chronic respiratory failure with hypoxia: Secondary | ICD-10-CM | POA: Diagnosis not present

## 2017-09-13 DIAGNOSIS — I119 Hypertensive heart disease without heart failure: Secondary | ICD-10-CM | POA: Diagnosis not present

## 2017-09-13 DIAGNOSIS — H01021 Squamous blepharitis right upper eyelid: Secondary | ICD-10-CM | POA: Diagnosis not present

## 2017-09-14 DIAGNOSIS — M4800 Spinal stenosis, site unspecified: Secondary | ICD-10-CM | POA: Diagnosis not present

## 2017-09-15 DIAGNOSIS — M4800 Spinal stenosis, site unspecified: Secondary | ICD-10-CM | POA: Diagnosis not present

## 2017-09-16 DIAGNOSIS — M4800 Spinal stenosis, site unspecified: Secondary | ICD-10-CM | POA: Diagnosis not present

## 2017-09-17 DIAGNOSIS — M4800 Spinal stenosis, site unspecified: Secondary | ICD-10-CM | POA: Diagnosis not present

## 2017-09-18 DIAGNOSIS — M4800 Spinal stenosis, site unspecified: Secondary | ICD-10-CM | POA: Diagnosis not present

## 2017-09-19 DIAGNOSIS — M4800 Spinal stenosis, site unspecified: Secondary | ICD-10-CM | POA: Diagnosis not present

## 2017-09-20 DIAGNOSIS — K5730 Diverticulosis of large intestine without perforation or abscess without bleeding: Secondary | ICD-10-CM | POA: Diagnosis not present

## 2017-09-20 DIAGNOSIS — I119 Hypertensive heart disease without heart failure: Secondary | ICD-10-CM | POA: Diagnosis not present

## 2017-09-20 DIAGNOSIS — M80071D Age-related osteoporosis with current pathological fracture, right ankle and foot, subsequent encounter for fracture with routine healing: Secondary | ICD-10-CM | POA: Diagnosis not present

## 2017-09-20 DIAGNOSIS — J9621 Acute and chronic respiratory failure with hypoxia: Secondary | ICD-10-CM | POA: Diagnosis not present

## 2017-09-20 DIAGNOSIS — M4800 Spinal stenosis, site unspecified: Secondary | ICD-10-CM | POA: Diagnosis not present

## 2017-09-20 DIAGNOSIS — I714 Abdominal aortic aneurysm, without rupture: Secondary | ICD-10-CM | POA: Diagnosis not present

## 2017-09-20 DIAGNOSIS — F250 Schizoaffective disorder, bipolar type: Secondary | ICD-10-CM | POA: Diagnosis not present

## 2017-09-21 DIAGNOSIS — M4800 Spinal stenosis, site unspecified: Secondary | ICD-10-CM | POA: Diagnosis not present

## 2017-09-22 DIAGNOSIS — M4800 Spinal stenosis, site unspecified: Secondary | ICD-10-CM | POA: Diagnosis not present

## 2017-09-23 DIAGNOSIS — M4800 Spinal stenosis, site unspecified: Secondary | ICD-10-CM | POA: Diagnosis not present

## 2017-09-24 DIAGNOSIS — M4800 Spinal stenosis, site unspecified: Secondary | ICD-10-CM | POA: Diagnosis not present

## 2017-09-25 DIAGNOSIS — M1711 Unilateral primary osteoarthritis, right knee: Secondary | ICD-10-CM | POA: Diagnosis not present

## 2017-09-25 DIAGNOSIS — M4800 Spinal stenosis, site unspecified: Secondary | ICD-10-CM | POA: Diagnosis not present

## 2017-09-25 DIAGNOSIS — S92154A Nondisplaced avulsion fracture (chip fracture) of right talus, initial encounter for closed fracture: Secondary | ICD-10-CM | POA: Diagnosis not present

## 2017-09-25 DIAGNOSIS — M25532 Pain in left wrist: Secondary | ICD-10-CM | POA: Diagnosis not present

## 2017-09-25 DIAGNOSIS — M1712 Unilateral primary osteoarthritis, left knee: Secondary | ICD-10-CM | POA: Diagnosis not present

## 2017-09-25 DIAGNOSIS — M25531 Pain in right wrist: Secondary | ICD-10-CM | POA: Diagnosis not present

## 2017-09-26 DIAGNOSIS — Z9181 History of falling: Secondary | ICD-10-CM | POA: Diagnosis not present

## 2017-09-26 DIAGNOSIS — N183 Chronic kidney disease, stage 3 (moderate): Secondary | ICD-10-CM | POA: Diagnosis not present

## 2017-09-26 DIAGNOSIS — M4800 Spinal stenosis, site unspecified: Secondary | ICD-10-CM | POA: Diagnosis not present

## 2017-09-26 DIAGNOSIS — E662 Morbid (severe) obesity with alveolar hypoventilation: Secondary | ICD-10-CM | POA: Diagnosis not present

## 2017-09-26 DIAGNOSIS — Z6841 Body Mass Index (BMI) 40.0 and over, adult: Secondary | ICD-10-CM | POA: Diagnosis not present

## 2017-09-26 DIAGNOSIS — I129 Hypertensive chronic kidney disease with stage 1 through stage 4 chronic kidney disease, or unspecified chronic kidney disease: Secondary | ICD-10-CM | POA: Diagnosis not present

## 2017-09-26 DIAGNOSIS — J9691 Respiratory failure, unspecified with hypoxia: Secondary | ICD-10-CM | POA: Diagnosis not present

## 2017-09-27 DIAGNOSIS — M4800 Spinal stenosis, site unspecified: Secondary | ICD-10-CM | POA: Diagnosis not present

## 2017-09-28 DIAGNOSIS — M4800 Spinal stenosis, site unspecified: Secondary | ICD-10-CM | POA: Diagnosis not present

## 2017-09-29 DIAGNOSIS — M4800 Spinal stenosis, site unspecified: Secondary | ICD-10-CM | POA: Diagnosis not present

## 2017-09-30 DIAGNOSIS — M4800 Spinal stenosis, site unspecified: Secondary | ICD-10-CM | POA: Diagnosis not present

## 2017-10-01 DIAGNOSIS — M4800 Spinal stenosis, site unspecified: Secondary | ICD-10-CM | POA: Diagnosis not present

## 2017-10-02 DIAGNOSIS — M4800 Spinal stenosis, site unspecified: Secondary | ICD-10-CM | POA: Diagnosis not present

## 2017-10-03 DIAGNOSIS — M4800 Spinal stenosis, site unspecified: Secondary | ICD-10-CM | POA: Diagnosis not present

## 2017-10-04 DIAGNOSIS — M4800 Spinal stenosis, site unspecified: Secondary | ICD-10-CM | POA: Diagnosis not present

## 2017-10-05 DIAGNOSIS — M4800 Spinal stenosis, site unspecified: Secondary | ICD-10-CM | POA: Diagnosis not present

## 2017-10-06 DIAGNOSIS — M4800 Spinal stenosis, site unspecified: Secondary | ICD-10-CM | POA: Diagnosis not present

## 2017-10-07 DIAGNOSIS — M4800 Spinal stenosis, site unspecified: Secondary | ICD-10-CM | POA: Diagnosis not present

## 2017-10-08 ENCOUNTER — Ambulatory Visit (INDEPENDENT_AMBULATORY_CARE_PROVIDER_SITE_OTHER): Payer: Medicare HMO | Admitting: *Deleted

## 2017-10-08 ENCOUNTER — Telehealth: Payer: Self-pay | Admitting: Cardiology

## 2017-10-08 DIAGNOSIS — M4800 Spinal stenosis, site unspecified: Secondary | ICD-10-CM | POA: Diagnosis not present

## 2017-10-08 DIAGNOSIS — R55 Syncope and collapse: Secondary | ICD-10-CM

## 2017-10-08 NOTE — Telephone Encounter (Signed)
LMOVM requesting that pt send manual transmission b/c home monitor has not updated in at least 14 days.    

## 2017-10-08 NOTE — Progress Notes (Signed)
Carelink Summary Report / Loop Recorder 

## 2017-10-09 DIAGNOSIS — M4800 Spinal stenosis, site unspecified: Secondary | ICD-10-CM | POA: Diagnosis not present

## 2017-10-10 DIAGNOSIS — M4800 Spinal stenosis, site unspecified: Secondary | ICD-10-CM | POA: Diagnosis not present

## 2017-10-11 DIAGNOSIS — M4800 Spinal stenosis, site unspecified: Secondary | ICD-10-CM | POA: Diagnosis not present

## 2017-10-12 DIAGNOSIS — M4800 Spinal stenosis, site unspecified: Secondary | ICD-10-CM | POA: Diagnosis not present

## 2017-10-13 DIAGNOSIS — M4800 Spinal stenosis, site unspecified: Secondary | ICD-10-CM | POA: Diagnosis not present

## 2017-10-15 LAB — CUP PACEART REMOTE DEVICE CHECK
Date Time Interrogation Session: 20190730193755
Implantable Pulse Generator Implant Date: 20180420

## 2017-10-21 DIAGNOSIS — M4800 Spinal stenosis, site unspecified: Secondary | ICD-10-CM | POA: Diagnosis not present

## 2017-10-22 DIAGNOSIS — M4800 Spinal stenosis, site unspecified: Secondary | ICD-10-CM | POA: Diagnosis not present

## 2017-10-23 DIAGNOSIS — M4800 Spinal stenosis, site unspecified: Secondary | ICD-10-CM | POA: Diagnosis not present

## 2017-10-24 DIAGNOSIS — M4800 Spinal stenosis, site unspecified: Secondary | ICD-10-CM | POA: Diagnosis not present

## 2017-10-25 DIAGNOSIS — M4800 Spinal stenosis, site unspecified: Secondary | ICD-10-CM | POA: Diagnosis not present

## 2017-10-26 DIAGNOSIS — M4800 Spinal stenosis, site unspecified: Secondary | ICD-10-CM | POA: Diagnosis not present

## 2017-10-27 DIAGNOSIS — M4800 Spinal stenosis, site unspecified: Secondary | ICD-10-CM | POA: Diagnosis not present

## 2017-10-28 DIAGNOSIS — M4800 Spinal stenosis, site unspecified: Secondary | ICD-10-CM | POA: Diagnosis not present

## 2017-10-29 ENCOUNTER — Telehealth: Payer: Self-pay

## 2017-10-29 DIAGNOSIS — M4800 Spinal stenosis, site unspecified: Secondary | ICD-10-CM | POA: Diagnosis not present

## 2017-10-29 NOTE — Telephone Encounter (Signed)
LMOVM requesting that pt send manual transmission b/c home monitor has not updated in at least 14 days.   Attempted to confirm remote transmission with pt. No answer and was unable to leave a message.   

## 2017-10-30 DIAGNOSIS — M4800 Spinal stenosis, site unspecified: Secondary | ICD-10-CM | POA: Diagnosis not present

## 2017-10-31 DIAGNOSIS — M4800 Spinal stenosis, site unspecified: Secondary | ICD-10-CM | POA: Diagnosis not present

## 2017-11-01 DIAGNOSIS — M4800 Spinal stenosis, site unspecified: Secondary | ICD-10-CM | POA: Diagnosis not present

## 2017-11-01 LAB — CUP PACEART REMOTE DEVICE CHECK
Date Time Interrogation Session: 20190901201052
Implantable Pulse Generator Implant Date: 20180420

## 2017-11-02 DIAGNOSIS — M4800 Spinal stenosis, site unspecified: Secondary | ICD-10-CM | POA: Diagnosis not present

## 2017-11-03 DIAGNOSIS — M4800 Spinal stenosis, site unspecified: Secondary | ICD-10-CM | POA: Diagnosis not present

## 2017-11-04 DIAGNOSIS — M4800 Spinal stenosis, site unspecified: Secondary | ICD-10-CM | POA: Diagnosis not present

## 2017-11-05 DIAGNOSIS — M4800 Spinal stenosis, site unspecified: Secondary | ICD-10-CM | POA: Diagnosis not present

## 2017-11-06 DIAGNOSIS — M4800 Spinal stenosis, site unspecified: Secondary | ICD-10-CM | POA: Diagnosis not present

## 2017-11-07 DIAGNOSIS — H01022 Squamous blepharitis right lower eyelid: Secondary | ICD-10-CM | POA: Diagnosis not present

## 2017-11-07 DIAGNOSIS — M4800 Spinal stenosis, site unspecified: Secondary | ICD-10-CM | POA: Diagnosis not present

## 2017-11-07 DIAGNOSIS — H35372 Puckering of macula, left eye: Secondary | ICD-10-CM | POA: Diagnosis not present

## 2017-11-07 DIAGNOSIS — H01021 Squamous blepharitis right upper eyelid: Secondary | ICD-10-CM | POA: Diagnosis not present

## 2017-11-07 DIAGNOSIS — H16142 Punctate keratitis, left eye: Secondary | ICD-10-CM | POA: Diagnosis not present

## 2017-11-07 DIAGNOSIS — H01025 Squamous blepharitis left lower eyelid: Secondary | ICD-10-CM | POA: Diagnosis not present

## 2017-11-07 DIAGNOSIS — H01024 Squamous blepharitis left upper eyelid: Secondary | ICD-10-CM | POA: Diagnosis not present

## 2017-11-07 DIAGNOSIS — H26491 Other secondary cataract, right eye: Secondary | ICD-10-CM | POA: Diagnosis not present

## 2017-11-08 ENCOUNTER — Encounter: Payer: Self-pay | Admitting: Cardiology

## 2017-11-08 ENCOUNTER — Ambulatory Visit (INDEPENDENT_AMBULATORY_CARE_PROVIDER_SITE_OTHER): Payer: Medicare HMO | Admitting: *Deleted

## 2017-11-08 DIAGNOSIS — R55 Syncope and collapse: Secondary | ICD-10-CM | POA: Diagnosis not present

## 2017-11-08 DIAGNOSIS — M4800 Spinal stenosis, site unspecified: Secondary | ICD-10-CM | POA: Diagnosis not present

## 2017-11-09 DIAGNOSIS — M4800 Spinal stenosis, site unspecified: Secondary | ICD-10-CM | POA: Diagnosis not present

## 2017-11-09 NOTE — Progress Notes (Signed)
Carelink Summary Report / Loop Recorder 

## 2017-11-10 DIAGNOSIS — M4800 Spinal stenosis, site unspecified: Secondary | ICD-10-CM | POA: Diagnosis not present

## 2017-11-11 DIAGNOSIS — M4800 Spinal stenosis, site unspecified: Secondary | ICD-10-CM | POA: Diagnosis not present

## 2017-11-12 DIAGNOSIS — M4800 Spinal stenosis, site unspecified: Secondary | ICD-10-CM | POA: Diagnosis not present

## 2017-11-12 LAB — CUP PACEART REMOTE DEVICE CHECK
Date Time Interrogation Session: 20191004200837
Implantable Pulse Generator Implant Date: 20180420

## 2017-11-13 DIAGNOSIS — M4800 Spinal stenosis, site unspecified: Secondary | ICD-10-CM | POA: Diagnosis not present

## 2017-11-14 DIAGNOSIS — M4800 Spinal stenosis, site unspecified: Secondary | ICD-10-CM | POA: Diagnosis not present

## 2017-11-15 DIAGNOSIS — M4800 Spinal stenosis, site unspecified: Secondary | ICD-10-CM | POA: Diagnosis not present

## 2017-11-16 DIAGNOSIS — M4800 Spinal stenosis, site unspecified: Secondary | ICD-10-CM | POA: Diagnosis not present

## 2017-11-17 DIAGNOSIS — M4800 Spinal stenosis, site unspecified: Secondary | ICD-10-CM | POA: Diagnosis not present

## 2017-11-18 DIAGNOSIS — M4800 Spinal stenosis, site unspecified: Secondary | ICD-10-CM | POA: Diagnosis not present

## 2017-11-19 DIAGNOSIS — M4800 Spinal stenosis, site unspecified: Secondary | ICD-10-CM | POA: Diagnosis not present

## 2017-11-20 DIAGNOSIS — M4800 Spinal stenosis, site unspecified: Secondary | ICD-10-CM | POA: Diagnosis not present

## 2017-11-21 ENCOUNTER — Telehealth: Payer: Self-pay

## 2017-11-21 DIAGNOSIS — M4800 Spinal stenosis, site unspecified: Secondary | ICD-10-CM | POA: Diagnosis not present

## 2017-11-21 NOTE — Telephone Encounter (Signed)
Pt called [employer] and they will send a new monitor. It will be 5-7 business days before she receives a new monitor.

## 2017-11-22 DIAGNOSIS — M4800 Spinal stenosis, site unspecified: Secondary | ICD-10-CM | POA: Diagnosis not present

## 2017-11-23 DIAGNOSIS — M4800 Spinal stenosis, site unspecified: Secondary | ICD-10-CM | POA: Diagnosis not present

## 2017-11-24 DIAGNOSIS — M4800 Spinal stenosis, site unspecified: Secondary | ICD-10-CM | POA: Diagnosis not present

## 2017-11-25 DIAGNOSIS — M4800 Spinal stenosis, site unspecified: Secondary | ICD-10-CM | POA: Diagnosis not present

## 2017-11-26 ENCOUNTER — Ambulatory Visit (INDEPENDENT_AMBULATORY_CARE_PROVIDER_SITE_OTHER): Payer: Medicare HMO | Admitting: Pulmonary Disease

## 2017-11-26 DIAGNOSIS — R0609 Other forms of dyspnea: Secondary | ICD-10-CM

## 2017-11-26 DIAGNOSIS — R0600 Dyspnea, unspecified: Secondary | ICD-10-CM

## 2017-11-26 DIAGNOSIS — M4800 Spinal stenosis, site unspecified: Secondary | ICD-10-CM | POA: Diagnosis not present

## 2017-11-26 NOTE — Progress Notes (Signed)
PFT was attempted today. Patient attempted spirometry and DLCO but could not perform the proper technique of the test. Dr. Craige Cotta was made aware of this.

## 2017-11-27 DIAGNOSIS — M4800 Spinal stenosis, site unspecified: Secondary | ICD-10-CM | POA: Diagnosis not present

## 2017-11-28 DIAGNOSIS — M4800 Spinal stenosis, site unspecified: Secondary | ICD-10-CM | POA: Diagnosis not present

## 2017-11-29 DIAGNOSIS — M4800 Spinal stenosis, site unspecified: Secondary | ICD-10-CM | POA: Diagnosis not present

## 2017-11-30 DIAGNOSIS — M4800 Spinal stenosis, site unspecified: Secondary | ICD-10-CM | POA: Diagnosis not present

## 2017-12-01 DIAGNOSIS — M4800 Spinal stenosis, site unspecified: Secondary | ICD-10-CM | POA: Diagnosis not present

## 2017-12-02 ENCOUNTER — Encounter: Payer: Self-pay | Admitting: Primary Care

## 2017-12-02 ENCOUNTER — Ambulatory Visit (INDEPENDENT_AMBULATORY_CARE_PROVIDER_SITE_OTHER): Payer: Medicare HMO | Admitting: Primary Care

## 2017-12-02 DIAGNOSIS — J9601 Acute respiratory failure with hypoxia: Secondary | ICD-10-CM

## 2017-12-02 DIAGNOSIS — M4800 Spinal stenosis, site unspecified: Secondary | ICD-10-CM | POA: Diagnosis not present

## 2017-12-02 DIAGNOSIS — Z23 Encounter for immunization: Secondary | ICD-10-CM

## 2017-12-02 DIAGNOSIS — E662 Morbid (severe) obesity with alveolar hypoventilation: Secondary | ICD-10-CM

## 2017-12-02 DIAGNOSIS — J45909 Unspecified asthma, uncomplicated: Secondary | ICD-10-CM | POA: Insufficient documentation

## 2017-12-02 NOTE — Assessment & Plan Note (Signed)
BMI 48 Discussed weight loss options  Refer to nutritionist

## 2017-12-02 NOTE — Assessment & Plan Note (Addendum)
D/t hypoventilation secondary to obesity Oxygen 87% RA on ambulation Needs 1L at rest, 4l pulsed on exertion  Qualified for POC, would like Inogen One G4

## 2017-12-02 NOTE — Assessment & Plan Note (Addendum)
Hospitalization in May for asthmatic bronchitis  Never smoked Needs PFTs   Continues PRN albuterol  FU in 3 months

## 2017-12-02 NOTE — Patient Instructions (Addendum)
Oxygen requirements: Needs 1 liter oxygen at rest 4 liters pulsed with activity   Follow-up: Please set up pulmonary function test  FU in 3 months with Dr. Craige Cotta with PFT prior   Referral: Nutritionist for weight loss RE: BMI 46  Qualifed for POC (Inogen ONE G4)  Change DME to [facility]   Office treatment: Received influenza vaccine shot today

## 2017-12-02 NOTE — Progress Notes (Signed)
@Patient  ID: Lindsey Rowe, female    DOB: 28-Sep-1945, 72 y.o.   MRN: [id number]  Chief Complaint  Patient presents with  . Follow-up    qualify for POC    Referring provider: Maurice Small, MD  HPI: 72 year old female, never smoked. PMH cough, OHS, dyspnea, acute resp failure, allergic rhinitis. Patient of Dr. Craige Cotta, last seen 08/14/17.   OV- 08/14/17/ Dr. [facility] admission in May 2019 for asthmatic bronchitis.  Breathing better now.  Not having cough, wheeze, sputum, chest pain/tightness.  Was using dulera in hospital, but only has albuterol now. Doesn't need albuterol much. Uses 2 liters 24/7. Sleep study didn't show significant sleep apnea. Walks with a walker.  It is hard for her to carry oxygen tanks. Will arrange for POC. Enc weight loss. Ordered for PFTs to evaluate asthmatic bronchitis. Continues prn albuterol.   12/02/2017 Patient presents today for review office visit to discuss inogen/ POC. Still needs PFTs. Breathing is ok, continues to use oxygen at home. She has only required prn Albuterol hfa 3 times. Down 3 lbs. Discussed weight loss options, would like to see a nutritionist.   Ambulatory oxygen level 87% on room air. Requires 1L at rest and 4L pulse with activity. Has concentrator at home, states that it's very hot in her living room with the concentrator and makes it harder to breath. Almost needs the [facility] running because of it. She has POC, asking about about Inogen 1 G4. States that she saw a TV commercial and can use 24/7, less heat, able to plug in. [facility] DME [facility]. Wants flu shot today.    Allergies  Allergen Reactions  . Gabapentin Other (See Comments)    Right foot and leg swelled   . Cefadroxil Itching    Ends of hair itched     Immunization History  Administered Date(s) Administered  . Influenza Split 10/21/2016  . Influenza Whole 11/17/2008, 12/26/2009  . Influenza, High Dose Seasonal PF 11/05/2016  . Influenza-Unspecified 10/07/2015    . Td 03/11/2009    Past Medical History:  Diagnosis Date  . Arthritis   . Back pain   . Bipolar 1 disorder (HCC)   . Complication of anesthesia    hard to wake up anesthesia  . Depression   . GERD (gastroesophageal reflux disease)   . Headache(784.0)    migraines and tension headaches  . Hyperlipidemia   . Hypertension   . Hypothyroidism   . Thyroid disease     Tobacco History: Social History   Tobacco Use  Smoking Status Never Smoker  Smokeless Tobacco Never Used   Counseling given: Not Answered   Outpatient Medications Prior to Visit  Medication Sig Dispense Refill  . albuterol (PROVENTIL HFA;VENTOLIN HFA) 108 (90 Base) MCG/ACT inhaler Inhale 2 puffs into the lungs every 6 (six) hours as needed for wheezing or shortness of breath. 1 Inhaler 2  . Aspirin-Salicylamide-Caffeine (BC HEADACHE POWDER PO) Take 1 packet by mouth as needed (headache, pain).    . Cholecalciferol (D3-1000) 1000 units capsule Take 1,000 Units by mouth daily.     . DULoxetine (CYMBALTA) 30 MG capsule Take 1 capsule (30 mg total) by mouth 2 (two) times daily. 60 capsule 0  . fluticasone (FLONASE) 50 MCG/ACT nasal spray Place 1 spray into both nostrils daily as needed for allergies or rhinitis. 16 g 2  . levothyroxine (SYNTHROID, LEVOTHROID) 75 MCG tablet Take 75 mcg by mouth daily before breakfast.     . lisinopril (  PRINIVIL,ZESTRIL) 10 MG tablet Take 10 mg by mouth daily.     [name] loperamide (IMODIUM) 2 MG capsule Take 1 capsule (2 mg total) by mouth 3 (three) times daily as needed for diarrhea or loose stools. 30 capsule 0  . omeprazole (PRILOSEC) 20 MG capsule Take 20 mg by mouth 2 (two) times daily before a meal.    . pravastatin (PRAVACHOL) 20 MG tablet Take 20 mg by mouth at bedtime.     . pregabalin (LYRICA) 100 MG capsule Take 1 capsule (100 mg total) by mouth 2 (two) times daily.    . SUMAtriptan (IMITREX) 100 MG tablet Take 100 mg by mouth See admin instructions. Take 100mg  at onset of  headache, may repeat in 2 hours if needed. Do not exceed 2 doses in 24 hours.     . traZODone (DESYREL) 100 MG tablet Take 300 mg by mouth at bedtime.     . vitamin B-12 (CYANOCOBALAMIN) 1000 MCG tablet Take 1,000 mcg by mouth daily.    . metoprolol succinate (TOPROL-XL) 50 MG 24 hr tablet Take 1 tablet (50 mg total) by mouth daily. Take with or immediately following a meal. 90 tablet 3   No facility-administered medications prior to visit.     Review of Systems  Review of Systems  Constitutional: Negative.   HENT: Negative.   Respiratory: Positive for shortness of breath. Negative for cough and wheezing.   Cardiovascular: Negative.     Physical Exam  BP 124/86 (BP Location: Left Arm, Cuff Size: Normal)   Pulse 66   Temp (!) 96.3 F (35.7 C)   Ht 5\' 3"  (1.6 m)   Wt 265 lb (120.2 kg)   SpO2 94%   BMI 46.94 kg/m  Physical Exam  Constitutional: She is oriented to person, place, and time. She appears well-developed and well-nourished.  HENT:  Head: Normocephalic and atraumatic.  Eyes: Pupils are equal, round, and reactive to light. EOM are normal.  Neck: Normal range of motion. Neck supple.  Cardiovascular: Normal rate and regular rhythm.  No edema   Pulmonary/Chest: Effort normal. No respiratory distress. She has no wheezes. She has no rales.  CTA  Musculoskeletal: Normal range of motion.  Amb with walker   Neurological: She is alert and oriented to person, place, and time.  Skin: Skin is warm.  Psychiatric: She has a normal mood and affect. Her behavior is normal. Judgment and thought content normal.     Lab Results:  CBC    Component Value Date/Time   WBC 7.8 07/03/2017 0335   RBC 3.80 (L) 07/03/2017 0335   HGB 11.3 (L) 07/03/2017 0335   HCT 36.5 07/03/2017 0335   PLT 286 07/03/2017 0335   MCV 96.1 07/03/2017 0335   MCH 29.7 07/03/2017 0335   MCHC 31.0 07/03/2017 0335   RDW 13.0 07/03/2017 0335   LYMPHSABS 1.8 06/26/2017 1633   MONOABS 0.5 06/26/2017 1633    EOSABS 0.1 06/26/2017 1633   BASOSABS 0.0 06/26/2017 1633    BMET    Component Value Date/Time   NA 140 07/03/2017 0335   K 4.6 07/03/2017 0335   CL 96 (L) 07/03/2017 0335   CO2 34 (H) 07/03/2017 0335   GLUCOSE 92 07/03/2017 0335   BUN 28 (H) 07/03/2017 0335   CREATININE 1.08 (H) 07/03/2017 0335   CALCIUM 8.7 (L) 07/03/2017 0335   GFRNONAA 51 (L) 07/03/2017 0335   GFRAA 59 (L) 07/03/2017 0335    BNP    Component Value Date/Time  BNP 39.8 06/27/2017 0754    ProBNP    Component Value Date/Time   PROBNP 10.1 03/29/2009 2124    Imaging: No results found.   Assessment & Plan:   Hypoventilation associated with obesity syndrome Requires home oxygen   Acute respiratory failure with hypoxia (HCC) D/t hypoventilation secondary to obesity Oxygen 87% RA on ambulation Needs 1L at rest, 4l pulsed on exertion  Qualified for POC, would like Inogen One G4  OBESITY BMI 48 Discussed weight loss options  Refer to nutritionist   Asthmatic bronchitis Hospitalization in May for asthmatic bronchitis  Never smoked Needs PFTs   Continues PRN albuterol  FU in 3 months    Glenford Bayley, NP 12/02/2017

## 2017-12-02 NOTE — Addendum Note (Signed)
Addended by: Earvin Hansen on: 12/02/2017 01:52 PM   Modules accepted: Orders

## 2017-12-02 NOTE — Assessment & Plan Note (Addendum)
Requires home oxygen

## 2017-12-03 ENCOUNTER — Telehealth: Payer: Self-pay | Admitting: Primary Care

## 2017-12-03 DIAGNOSIS — M4800 Spinal stenosis, site unspecified: Secondary | ICD-10-CM | POA: Diagnosis not present

## 2017-12-03 DIAGNOSIS — J9601 Acute respiratory failure with hypoxia: Secondary | ICD-10-CM

## 2017-12-03 NOTE — Telephone Encounter (Signed)
Order sent to [facility]

## 2017-12-03 NOTE — Telephone Encounter (Signed)
Order was fixed, PCC's please fax to [facility] again. Thanks.

## 2017-12-04 DIAGNOSIS — J9601 Acute respiratory failure with hypoxia: Secondary | ICD-10-CM | POA: Diagnosis not present

## 2017-12-04 DIAGNOSIS — J45909 Unspecified asthma, uncomplicated: Secondary | ICD-10-CM | POA: Diagnosis not present

## 2017-12-04 DIAGNOSIS — M4800 Spinal stenosis, site unspecified: Secondary | ICD-10-CM | POA: Diagnosis not present

## 2017-12-04 DIAGNOSIS — R0902 Hypoxemia: Secondary | ICD-10-CM | POA: Diagnosis not present

## 2017-12-04 NOTE — Progress Notes (Signed)
Reviewed and agree with assessment/plan.   Debrah Granderson, MD [facility] 02/01/2016, 12:24 PM Pager:  [phone]  

## 2017-12-05 DIAGNOSIS — M4800 Spinal stenosis, site unspecified: Secondary | ICD-10-CM | POA: Diagnosis not present

## 2017-12-06 DIAGNOSIS — M4800 Spinal stenosis, site unspecified: Secondary | ICD-10-CM | POA: Diagnosis not present

## 2017-12-07 DIAGNOSIS — M4800 Spinal stenosis, site unspecified: Secondary | ICD-10-CM | POA: Diagnosis not present

## 2017-12-08 DIAGNOSIS — M4800 Spinal stenosis, site unspecified: Secondary | ICD-10-CM | POA: Diagnosis not present

## 2017-12-09 DIAGNOSIS — M4800 Spinal stenosis, site unspecified: Secondary | ICD-10-CM | POA: Diagnosis not present

## 2017-12-10 DIAGNOSIS — M4800 Spinal stenosis, site unspecified: Secondary | ICD-10-CM | POA: Diagnosis not present

## 2017-12-11 ENCOUNTER — Ambulatory Visit (INDEPENDENT_AMBULATORY_CARE_PROVIDER_SITE_OTHER): Payer: Medicare HMO | Admitting: *Deleted

## 2017-12-11 DIAGNOSIS — M4800 Spinal stenosis, site unspecified: Secondary | ICD-10-CM | POA: Diagnosis not present

## 2017-12-11 DIAGNOSIS — R55 Syncope and collapse: Secondary | ICD-10-CM

## 2017-12-12 DIAGNOSIS — M4800 Spinal stenosis, site unspecified: Secondary | ICD-10-CM | POA: Diagnosis not present

## 2017-12-12 NOTE — Progress Notes (Signed)
Carelink Summary Report / Loop Recorder 

## 2017-12-13 DIAGNOSIS — M4800 Spinal stenosis, site unspecified: Secondary | ICD-10-CM | POA: Diagnosis not present

## 2017-12-14 DIAGNOSIS — M4800 Spinal stenosis, site unspecified: Secondary | ICD-10-CM | POA: Diagnosis not present

## 2017-12-15 DIAGNOSIS — M4800 Spinal stenosis, site unspecified: Secondary | ICD-10-CM | POA: Diagnosis not present

## 2017-12-16 ENCOUNTER — Telehealth: Payer: Self-pay | Admitting: Cardiology

## 2017-12-16 DIAGNOSIS — M4800 Spinal stenosis, site unspecified: Secondary | ICD-10-CM | POA: Diagnosis not present

## 2017-12-16 NOTE — Telephone Encounter (Signed)
Spoke w/ pt and requested that she send a manual transmission b/c her home monitor has not updated in at least 14 days.   

## 2017-12-17 DIAGNOSIS — M4800 Spinal stenosis, site unspecified: Secondary | ICD-10-CM | POA: Diagnosis not present

## 2017-12-18 DIAGNOSIS — M4800 Spinal stenosis, site unspecified: Secondary | ICD-10-CM | POA: Diagnosis not present

## 2017-12-19 DIAGNOSIS — M4800 Spinal stenosis, site unspecified: Secondary | ICD-10-CM | POA: Diagnosis not present

## 2017-12-20 DIAGNOSIS — M4800 Spinal stenosis, site unspecified: Secondary | ICD-10-CM | POA: Diagnosis not present

## 2017-12-21 DIAGNOSIS — M4800 Spinal stenosis, site unspecified: Secondary | ICD-10-CM | POA: Diagnosis not present

## 2017-12-22 DIAGNOSIS — M4800 Spinal stenosis, site unspecified: Secondary | ICD-10-CM | POA: Diagnosis not present

## 2017-12-23 DIAGNOSIS — M4800 Spinal stenosis, site unspecified: Secondary | ICD-10-CM | POA: Diagnosis not present

## 2017-12-24 DIAGNOSIS — M4800 Spinal stenosis, site unspecified: Secondary | ICD-10-CM | POA: Diagnosis not present

## 2017-12-25 DIAGNOSIS — M4800 Spinal stenosis, site unspecified: Secondary | ICD-10-CM | POA: Diagnosis not present

## 2017-12-26 DIAGNOSIS — M4800 Spinal stenosis, site unspecified: Secondary | ICD-10-CM | POA: Diagnosis not present

## 2017-12-27 DIAGNOSIS — M4800 Spinal stenosis, site unspecified: Secondary | ICD-10-CM | POA: Diagnosis not present

## 2017-12-28 DIAGNOSIS — M4800 Spinal stenosis, site unspecified: Secondary | ICD-10-CM | POA: Diagnosis not present

## 2017-12-29 DIAGNOSIS — M4800 Spinal stenosis, site unspecified: Secondary | ICD-10-CM | POA: Diagnosis not present

## 2017-12-30 ENCOUNTER — Telehealth: Payer: Self-pay | Admitting: Cardiology

## 2017-12-30 DIAGNOSIS — M4800 Spinal stenosis, site unspecified: Secondary | ICD-10-CM | POA: Diagnosis not present

## 2017-12-30 NOTE — Telephone Encounter (Signed)
Spoke w/ pt and requested that she send a manual transmission b/c her home monitor has not updated in at least 14 days.   

## 2017-12-31 DIAGNOSIS — M4800 Spinal stenosis, site unspecified: Secondary | ICD-10-CM | POA: Diagnosis not present

## 2018-01-01 DIAGNOSIS — M4800 Spinal stenosis, site unspecified: Secondary | ICD-10-CM | POA: Diagnosis not present

## 2018-01-02 DIAGNOSIS — M4800 Spinal stenosis, site unspecified: Secondary | ICD-10-CM | POA: Diagnosis not present

## 2018-01-03 DIAGNOSIS — M4800 Spinal stenosis, site unspecified: Secondary | ICD-10-CM | POA: Diagnosis not present

## 2018-01-04 DIAGNOSIS — J9601 Acute respiratory failure with hypoxia: Secondary | ICD-10-CM | POA: Diagnosis not present

## 2018-01-04 DIAGNOSIS — M4800 Spinal stenosis, site unspecified: Secondary | ICD-10-CM | POA: Diagnosis not present

## 2018-01-04 DIAGNOSIS — J45909 Unspecified asthma, uncomplicated: Secondary | ICD-10-CM | POA: Diagnosis not present

## 2018-01-04 DIAGNOSIS — R0902 Hypoxemia: Secondary | ICD-10-CM | POA: Diagnosis not present

## 2018-01-05 DIAGNOSIS — M4800 Spinal stenosis, site unspecified: Secondary | ICD-10-CM | POA: Diagnosis not present

## 2018-01-13 ENCOUNTER — Ambulatory Visit (INDEPENDENT_AMBULATORY_CARE_PROVIDER_SITE_OTHER): Payer: Medicare HMO

## 2018-01-13 DIAGNOSIS — M4800 Spinal stenosis, site unspecified: Secondary | ICD-10-CM | POA: Diagnosis not present

## 2018-01-13 DIAGNOSIS — R55 Syncope and collapse: Secondary | ICD-10-CM

## 2018-01-14 ENCOUNTER — Telehealth: Payer: Self-pay | Admitting: Cardiology

## 2018-01-14 DIAGNOSIS — M4800 Spinal stenosis, site unspecified: Secondary | ICD-10-CM | POA: Diagnosis not present

## 2018-01-14 NOTE — Telephone Encounter (Signed)
LMOVM requesting that pt send manual transmission b/c home monitor has not updated in at least 14 days.    

## 2018-01-15 DIAGNOSIS — M4800 Spinal stenosis, site unspecified: Secondary | ICD-10-CM | POA: Diagnosis not present

## 2018-01-15 NOTE — Progress Notes (Signed)
Carelink Summary Report / Loop Recorder 

## 2018-01-16 DIAGNOSIS — M4800 Spinal stenosis, site unspecified: Secondary | ICD-10-CM | POA: Diagnosis not present

## 2018-01-17 DIAGNOSIS — M4800 Spinal stenosis, site unspecified: Secondary | ICD-10-CM | POA: Diagnosis not present

## 2018-01-18 DIAGNOSIS — M4800 Spinal stenosis, site unspecified: Secondary | ICD-10-CM | POA: Diagnosis not present

## 2018-01-19 DIAGNOSIS — M4800 Spinal stenosis, site unspecified: Secondary | ICD-10-CM | POA: Diagnosis not present

## 2018-01-20 DIAGNOSIS — M4800 Spinal stenosis, site unspecified: Secondary | ICD-10-CM | POA: Diagnosis not present

## 2018-01-21 DIAGNOSIS — M4800 Spinal stenosis, site unspecified: Secondary | ICD-10-CM | POA: Diagnosis not present

## 2018-01-22 DIAGNOSIS — M4800 Spinal stenosis, site unspecified: Secondary | ICD-10-CM | POA: Diagnosis not present

## 2018-01-23 DIAGNOSIS — M4800 Spinal stenosis, site unspecified: Secondary | ICD-10-CM | POA: Diagnosis not present

## 2018-01-24 DIAGNOSIS — M4800 Spinal stenosis, site unspecified: Secondary | ICD-10-CM | POA: Diagnosis not present

## 2018-01-25 DIAGNOSIS — M4800 Spinal stenosis, site unspecified: Secondary | ICD-10-CM | POA: Diagnosis not present

## 2018-01-26 DIAGNOSIS — M4800 Spinal stenosis, site unspecified: Secondary | ICD-10-CM | POA: Diagnosis not present

## 2018-01-27 DIAGNOSIS — M4800 Spinal stenosis, site unspecified: Secondary | ICD-10-CM | POA: Diagnosis not present

## 2018-01-28 DIAGNOSIS — M4800 Spinal stenosis, site unspecified: Secondary | ICD-10-CM | POA: Diagnosis not present

## 2018-01-29 DIAGNOSIS — M4800 Spinal stenosis, site unspecified: Secondary | ICD-10-CM | POA: Diagnosis not present

## 2018-01-30 DIAGNOSIS — M4800 Spinal stenosis, site unspecified: Secondary | ICD-10-CM | POA: Diagnosis not present

## 2018-01-31 DIAGNOSIS — M4800 Spinal stenosis, site unspecified: Secondary | ICD-10-CM | POA: Diagnosis not present

## 2018-02-01 DIAGNOSIS — M4800 Spinal stenosis, site unspecified: Secondary | ICD-10-CM | POA: Diagnosis not present

## 2018-02-01 LAB — CUP PACEART REMOTE DEVICE CHECK
Date Time Interrogation Session: 20191106210940
Implantable Pulse Generator Implant Date: 20180420

## 2018-02-02 DIAGNOSIS — M4800 Spinal stenosis, site unspecified: Secondary | ICD-10-CM | POA: Diagnosis not present

## 2018-02-03 DIAGNOSIS — M810 Age-related osteoporosis without current pathological fracture: Secondary | ICD-10-CM | POA: Diagnosis not present

## 2018-02-03 DIAGNOSIS — R0902 Hypoxemia: Secondary | ICD-10-CM | POA: Diagnosis not present

## 2018-02-03 DIAGNOSIS — J45909 Unspecified asthma, uncomplicated: Secondary | ICD-10-CM | POA: Diagnosis not present

## 2018-02-03 DIAGNOSIS — J9601 Acute respiratory failure with hypoxia: Secondary | ICD-10-CM | POA: Diagnosis not present

## 2018-02-03 DIAGNOSIS — M4800 Spinal stenosis, site unspecified: Secondary | ICD-10-CM | POA: Diagnosis not present

## 2018-02-04 DIAGNOSIS — M4800 Spinal stenosis, site unspecified: Secondary | ICD-10-CM | POA: Diagnosis not present

## 2018-02-05 DIAGNOSIS — M4800 Spinal stenosis, site unspecified: Secondary | ICD-10-CM | POA: Diagnosis not present

## 2018-02-06 DIAGNOSIS — M4800 Spinal stenosis, site unspecified: Secondary | ICD-10-CM | POA: Diagnosis not present

## 2018-02-07 DIAGNOSIS — M4800 Spinal stenosis, site unspecified: Secondary | ICD-10-CM | POA: Diagnosis not present

## 2018-02-08 DIAGNOSIS — M4800 Spinal stenosis, site unspecified: Secondary | ICD-10-CM | POA: Diagnosis not present

## 2018-02-09 DIAGNOSIS — M4800 Spinal stenosis, site unspecified: Secondary | ICD-10-CM | POA: Diagnosis not present

## 2018-02-10 DIAGNOSIS — M4800 Spinal stenosis, site unspecified: Secondary | ICD-10-CM | POA: Diagnosis not present

## 2018-02-11 DIAGNOSIS — M4800 Spinal stenosis, site unspecified: Secondary | ICD-10-CM | POA: Diagnosis not present

## 2018-02-12 DIAGNOSIS — H16142 Punctate keratitis, left eye: Secondary | ICD-10-CM | POA: Diagnosis not present

## 2018-02-12 DIAGNOSIS — H16223 Keratoconjunctivitis sicca, not specified as Sjogren's, bilateral: Secondary | ICD-10-CM | POA: Diagnosis not present

## 2018-02-12 DIAGNOSIS — M4800 Spinal stenosis, site unspecified: Secondary | ICD-10-CM | POA: Diagnosis not present

## 2018-02-12 DIAGNOSIS — H26491 Other secondary cataract, right eye: Secondary | ICD-10-CM | POA: Diagnosis not present

## 2018-02-13 DIAGNOSIS — M4800 Spinal stenosis, site unspecified: Secondary | ICD-10-CM | POA: Diagnosis not present

## 2018-02-14 DIAGNOSIS — Z1389 Encounter for screening for other disorder: Secondary | ICD-10-CM | POA: Diagnosis not present

## 2018-02-14 DIAGNOSIS — M4800 Spinal stenosis, site unspecified: Secondary | ICD-10-CM | POA: Diagnosis not present

## 2018-02-14 DIAGNOSIS — Z1159 Encounter for screening for other viral diseases: Secondary | ICD-10-CM | POA: Diagnosis not present

## 2018-02-14 DIAGNOSIS — M810 Age-related osteoporosis without current pathological fracture: Secondary | ICD-10-CM | POA: Diagnosis not present

## 2018-02-14 DIAGNOSIS — J439 Emphysema, unspecified: Secondary | ICD-10-CM | POA: Diagnosis not present

## 2018-02-14 DIAGNOSIS — Z0000 Encounter for general adult medical examination without abnormal findings: Secondary | ICD-10-CM | POA: Diagnosis not present

## 2018-02-14 DIAGNOSIS — F319 Bipolar disorder, unspecified: Secondary | ICD-10-CM | POA: Diagnosis not present

## 2018-02-14 DIAGNOSIS — J9691 Respiratory failure, unspecified with hypoxia: Secondary | ICD-10-CM | POA: Diagnosis not present

## 2018-02-14 DIAGNOSIS — E662 Morbid (severe) obesity with alveolar hypoventilation: Secondary | ICD-10-CM | POA: Diagnosis not present

## 2018-02-14 DIAGNOSIS — E7800 Pure hypercholesterolemia, unspecified: Secondary | ICD-10-CM | POA: Diagnosis not present

## 2018-02-14 DIAGNOSIS — J449 Chronic obstructive pulmonary disease, unspecified: Secondary | ICD-10-CM | POA: Diagnosis not present

## 2018-02-14 DIAGNOSIS — I712 Thoracic aortic aneurysm, without rupture: Secondary | ICD-10-CM | POA: Diagnosis not present

## 2018-02-14 DIAGNOSIS — N183 Chronic kidney disease, stage 3 (moderate): Secondary | ICD-10-CM | POA: Diagnosis not present

## 2018-02-14 DIAGNOSIS — Z6841 Body Mass Index (BMI) 40.0 and over, adult: Secondary | ICD-10-CM | POA: Diagnosis not present

## 2018-02-15 DIAGNOSIS — M4800 Spinal stenosis, site unspecified: Secondary | ICD-10-CM | POA: Diagnosis not present

## 2018-02-16 DIAGNOSIS — M4800 Spinal stenosis, site unspecified: Secondary | ICD-10-CM | POA: Diagnosis not present

## 2018-02-17 ENCOUNTER — Ambulatory Visit (INDEPENDENT_AMBULATORY_CARE_PROVIDER_SITE_OTHER): Payer: Medicare HMO

## 2018-02-17 DIAGNOSIS — M4800 Spinal stenosis, site unspecified: Secondary | ICD-10-CM | POA: Diagnosis not present

## 2018-02-17 DIAGNOSIS — R55 Syncope and collapse: Secondary | ICD-10-CM | POA: Diagnosis not present

## 2018-02-18 DIAGNOSIS — M4800 Spinal stenosis, site unspecified: Secondary | ICD-10-CM | POA: Diagnosis not present

## 2018-02-18 LAB — CUP PACEART REMOTE DEVICE CHECK
Date Time Interrogation Session: 20200111214143
Implantable Pulse Generator Implant Date: 20180420

## 2018-02-18 NOTE — Progress Notes (Signed)
Carelink Summary Report / Loop Recorder 

## 2018-02-19 DIAGNOSIS — M4800 Spinal stenosis, site unspecified: Secondary | ICD-10-CM | POA: Diagnosis not present

## 2018-02-20 DIAGNOSIS — M1712 Unilateral primary osteoarthritis, left knee: Secondary | ICD-10-CM | POA: Diagnosis not present

## 2018-02-20 DIAGNOSIS — M170 Bilateral primary osteoarthritis of knee: Secondary | ICD-10-CM | POA: Diagnosis not present

## 2018-02-20 DIAGNOSIS — M4800 Spinal stenosis, site unspecified: Secondary | ICD-10-CM | POA: Diagnosis not present

## 2018-02-20 DIAGNOSIS — M1711 Unilateral primary osteoarthritis, right knee: Secondary | ICD-10-CM | POA: Diagnosis not present

## 2018-02-21 ENCOUNTER — Other Ambulatory Visit: Payer: Self-pay | Admitting: Family Medicine

## 2018-02-21 DIAGNOSIS — Z1231 Encounter for screening mammogram for malignant neoplasm of breast: Secondary | ICD-10-CM

## 2018-02-21 DIAGNOSIS — M4800 Spinal stenosis, site unspecified: Secondary | ICD-10-CM | POA: Diagnosis not present

## 2018-02-22 DIAGNOSIS — M4800 Spinal stenosis, site unspecified: Secondary | ICD-10-CM | POA: Diagnosis not present

## 2018-02-23 DIAGNOSIS — M4800 Spinal stenosis, site unspecified: Secondary | ICD-10-CM | POA: Diagnosis not present

## 2018-02-23 LAB — CUP PACEART REMOTE DEVICE CHECK
Date Time Interrogation Session: 20191209211043
Implantable Pulse Generator Implant Date: 20180420

## 2018-02-24 DIAGNOSIS — M1711 Unilateral primary osteoarthritis, right knee: Secondary | ICD-10-CM | POA: Diagnosis not present

## 2018-03-03 DIAGNOSIS — M4800 Spinal stenosis, site unspecified: Secondary | ICD-10-CM | POA: Diagnosis not present

## 2018-03-04 DIAGNOSIS — M4800 Spinal stenosis, site unspecified: Secondary | ICD-10-CM | POA: Diagnosis not present

## 2018-03-05 DIAGNOSIS — M4800 Spinal stenosis, site unspecified: Secondary | ICD-10-CM | POA: Diagnosis not present

## 2018-03-06 DIAGNOSIS — J9601 Acute respiratory failure with hypoxia: Secondary | ICD-10-CM | POA: Diagnosis not present

## 2018-03-06 DIAGNOSIS — M4800 Spinal stenosis, site unspecified: Secondary | ICD-10-CM | POA: Diagnosis not present

## 2018-03-06 DIAGNOSIS — J45909 Unspecified asthma, uncomplicated: Secondary | ICD-10-CM | POA: Diagnosis not present

## 2018-03-06 DIAGNOSIS — R0902 Hypoxemia: Secondary | ICD-10-CM | POA: Diagnosis not present

## 2018-03-07 ENCOUNTER — Other Ambulatory Visit: Payer: Self-pay | Admitting: Pulmonary Disease

## 2018-03-07 DIAGNOSIS — R0600 Dyspnea, unspecified: Secondary | ICD-10-CM

## 2018-03-07 DIAGNOSIS — R0609 Other forms of dyspnea: Principal | ICD-10-CM

## 2018-03-07 DIAGNOSIS — M4800 Spinal stenosis, site unspecified: Secondary | ICD-10-CM | POA: Diagnosis not present

## 2018-03-08 DIAGNOSIS — M4800 Spinal stenosis, site unspecified: Secondary | ICD-10-CM | POA: Diagnosis not present

## 2018-03-09 DIAGNOSIS — M4800 Spinal stenosis, site unspecified: Secondary | ICD-10-CM | POA: Diagnosis not present

## 2018-03-10 ENCOUNTER — Ambulatory Visit: Payer: Self-pay | Admitting: Pulmonary Disease

## 2018-03-17 ENCOUNTER — Ambulatory Visit: Payer: Self-pay | Admitting: Pulmonary Disease

## 2018-03-17 DIAGNOSIS — M4800 Spinal stenosis, site unspecified: Secondary | ICD-10-CM | POA: Diagnosis not present

## 2018-03-18 DIAGNOSIS — M4800 Spinal stenosis, site unspecified: Secondary | ICD-10-CM | POA: Diagnosis not present

## 2018-03-19 ENCOUNTER — Ambulatory Visit: Payer: Self-pay

## 2018-03-19 DIAGNOSIS — M4800 Spinal stenosis, site unspecified: Secondary | ICD-10-CM | POA: Diagnosis not present

## 2018-03-20 ENCOUNTER — Ambulatory Visit (INDEPENDENT_AMBULATORY_CARE_PROVIDER_SITE_OTHER): Payer: Medicare HMO

## 2018-03-20 DIAGNOSIS — R55 Syncope and collapse: Secondary | ICD-10-CM

## 2018-03-20 DIAGNOSIS — M4800 Spinal stenosis, site unspecified: Secondary | ICD-10-CM | POA: Diagnosis not present

## 2018-03-21 ENCOUNTER — Telehealth: Payer: Self-pay

## 2018-03-21 DIAGNOSIS — M4800 Spinal stenosis, site unspecified: Secondary | ICD-10-CM | POA: Diagnosis not present

## 2018-03-21 LAB — CUP PACEART REMOTE DEVICE CHECK
Date Time Interrogation Session: 20200213224025
Implantable Pulse Generator Implant Date: 20180420

## 2018-03-21 NOTE — Telephone Encounter (Signed)
Left message for patient regarding disconnected monitor.  

## 2018-03-22 DIAGNOSIS — M4800 Spinal stenosis, site unspecified: Secondary | ICD-10-CM | POA: Diagnosis not present

## 2018-03-23 DIAGNOSIS — M4800 Spinal stenosis, site unspecified: Secondary | ICD-10-CM | POA: Diagnosis not present

## 2018-03-24 DIAGNOSIS — M4800 Spinal stenosis, site unspecified: Secondary | ICD-10-CM | POA: Diagnosis not present

## 2018-03-25 DIAGNOSIS — M4800 Spinal stenosis, site unspecified: Secondary | ICD-10-CM | POA: Diagnosis not present

## 2018-03-26 DIAGNOSIS — M4800 Spinal stenosis, site unspecified: Secondary | ICD-10-CM | POA: Diagnosis not present

## 2018-03-27 DIAGNOSIS — M4800 Spinal stenosis, site unspecified: Secondary | ICD-10-CM | POA: Diagnosis not present

## 2018-03-28 DIAGNOSIS — M4800 Spinal stenosis, site unspecified: Secondary | ICD-10-CM | POA: Diagnosis not present

## 2018-03-29 DIAGNOSIS — M4800 Spinal stenosis, site unspecified: Secondary | ICD-10-CM | POA: Diagnosis not present

## 2018-03-30 DIAGNOSIS — M4800 Spinal stenosis, site unspecified: Secondary | ICD-10-CM | POA: Diagnosis not present

## 2018-03-31 DIAGNOSIS — M4800 Spinal stenosis, site unspecified: Secondary | ICD-10-CM | POA: Diagnosis not present

## 2018-04-01 DIAGNOSIS — M4800 Spinal stenosis, site unspecified: Secondary | ICD-10-CM | POA: Diagnosis not present

## 2018-04-01 NOTE — Progress Notes (Signed)
Carelink Summary Report / Loop Recorder 

## 2018-04-02 DIAGNOSIS — M4800 Spinal stenosis, site unspecified: Secondary | ICD-10-CM | POA: Diagnosis not present

## 2018-04-03 DIAGNOSIS — M4800 Spinal stenosis, site unspecified: Secondary | ICD-10-CM | POA: Diagnosis not present

## 2018-04-04 DIAGNOSIS — M4800 Spinal stenosis, site unspecified: Secondary | ICD-10-CM | POA: Diagnosis not present

## 2018-04-05 DIAGNOSIS — R0902 Hypoxemia: Secondary | ICD-10-CM | POA: Diagnosis not present

## 2018-04-05 DIAGNOSIS — J45909 Unspecified asthma, uncomplicated: Secondary | ICD-10-CM | POA: Diagnosis not present

## 2018-04-05 DIAGNOSIS — M4800 Spinal stenosis, site unspecified: Secondary | ICD-10-CM | POA: Diagnosis not present

## 2018-04-05 DIAGNOSIS — J9601 Acute respiratory failure with hypoxia: Secondary | ICD-10-CM | POA: Diagnosis not present

## 2018-04-06 DIAGNOSIS — M4800 Spinal stenosis, site unspecified: Secondary | ICD-10-CM | POA: Diagnosis not present

## 2018-04-07 ENCOUNTER — Encounter (HOSPITAL_COMMUNITY): Payer: Self-pay

## 2018-04-07 ENCOUNTER — Emergency Department (HOSPITAL_COMMUNITY): Payer: Medicare HMO

## 2018-04-07 ENCOUNTER — Other Ambulatory Visit: Payer: Self-pay

## 2018-04-07 ENCOUNTER — Emergency Department (HOSPITAL_COMMUNITY)
Admission: EM | Admit: 2018-04-07 | Discharge: 2018-04-07 | Disposition: A | Discharge status: Home / Self Care | Payer: Medicare HMO | Attending: Emergency Medicine | Admitting: Emergency Medicine

## 2018-04-07 ENCOUNTER — Emergency Department (HOSPITAL_BASED_OUTPATIENT_CLINIC_OR_DEPARTMENT_OTHER): Payer: Medicare HMO

## 2018-04-07 DIAGNOSIS — M4800 Spinal stenosis, site unspecified: Secondary | ICD-10-CM | POA: Diagnosis not present

## 2018-04-07 DIAGNOSIS — R062 Wheezing: Secondary | ICD-10-CM | POA: Diagnosis not present

## 2018-04-07 DIAGNOSIS — R609 Edema, unspecified: Secondary | ICD-10-CM

## 2018-04-07 DIAGNOSIS — R0602 Shortness of breath: Secondary | ICD-10-CM

## 2018-04-07 DIAGNOSIS — R05 Cough: Secondary | ICD-10-CM | POA: Insufficient documentation

## 2018-04-07 DIAGNOSIS — R52 Pain, unspecified: Secondary | ICD-10-CM

## 2018-04-07 DIAGNOSIS — Z79899 Other long term (current) drug therapy: Secondary | ICD-10-CM | POA: Insufficient documentation

## 2018-04-07 DIAGNOSIS — S299XXA Unspecified injury of thorax, initial encounter: Secondary | ICD-10-CM | POA: Diagnosis not present

## 2018-04-07 DIAGNOSIS — E039 Hypothyroidism, unspecified: Secondary | ICD-10-CM | POA: Insufficient documentation

## 2018-04-07 DIAGNOSIS — R110 Nausea: Secondary | ICD-10-CM | POA: Insufficient documentation

## 2018-04-07 DIAGNOSIS — R279 Unspecified lack of coordination: Secondary | ICD-10-CM | POA: Diagnosis not present

## 2018-04-07 DIAGNOSIS — I712 Thoracic aortic aneurysm, without rupture, unspecified: Secondary | ICD-10-CM

## 2018-04-07 DIAGNOSIS — M79661 Pain in right lower leg: Secondary | ICD-10-CM | POA: Diagnosis not present

## 2018-04-07 DIAGNOSIS — S8991XA Unspecified injury of right lower leg, initial encounter: Secondary | ICD-10-CM | POA: Diagnosis not present

## 2018-04-07 DIAGNOSIS — M79609 Pain in unspecified limb: Secondary | ICD-10-CM | POA: Diagnosis not present

## 2018-04-07 DIAGNOSIS — Z743 Need for continuous supervision: Secondary | ICD-10-CM | POA: Diagnosis not present

## 2018-04-07 DIAGNOSIS — I10 Essential (primary) hypertension: Secondary | ICD-10-CM | POA: Diagnosis not present

## 2018-04-07 LAB — BASIC METABOLIC PANEL
Anion gap: 9 (ref 5–15)
BUN: 10 mg/dL (ref 8–23)
CO2: 29 mmol/L (ref 22–32)
Calcium: 8.7 mg/dL — ABNORMAL LOW (ref 8.9–10.3)
Chloride: 102 mmol/L (ref 98–111)
Creatinine, Ser: 0.88 mg/dL (ref 0.44–1.00)
GFR calc Af Amer: >60 mL/min (ref >60)
GFR calc non Af Amer: >60 mL/min (ref >60)
Glucose, Bld: 109 mg/dL — ABNORMAL HIGH (ref 70–99)
Potassium: 4.2 mmol/L (ref 3.5–5.1)
Sodium: 140 mmol/L (ref 135–145)

## 2018-04-07 LAB — CBC WITH DIFFERENTIAL/PLATELET
Abs Immature Granulocytes: 0.03 10*3/uL (ref 0.00–0.07)
Basophils Absolute: 0 10*3/uL (ref 0.0–0.1)
Basophils Relative: 0 %
Eosinophils Absolute: 0 10*3/uL (ref 0.0–0.5)
Eosinophils Relative: 0 %
HCT: 38 % (ref 36.0–46.0)
Hemoglobin: 11.6 g/dL — ABNORMAL LOW (ref 12.0–15.0)
Immature Granulocytes: 0 %
Lymphocytes Relative: 23 %
Lymphs Abs: 2 10*3/uL (ref 0.7–4.0)
MCH: 30.4 pg (ref 26.0–34.0)
MCHC: 30.5 g/dL (ref 30.0–36.0)
MCV: 99.7 fL (ref 80.0–100.0)
Monocytes Absolute: 0.7 10*3/uL (ref 0.1–1.0)
Monocytes Relative: 8 %
Neutro Abs: 6.1 10*3/uL (ref 1.7–7.7)
Neutrophils Relative %: 69 %
Platelets: 224 10*3/uL (ref 150–400)
RBC: 3.81 MIL/uL — ABNORMAL LOW (ref 3.87–5.11)
RDW: 14.1 % (ref 11.5–15.5)
WBC: 8.9 10*3/uL (ref 4.0–10.5)
nRBC: 0 % (ref 0.0–0.2)

## 2018-04-07 LAB — BRAIN NATRIURETIC PEPTIDE: B Natriuretic Peptide: 79.7 pg/mL (ref 0.0–100.0)

## 2018-04-07 LAB — D-DIMER, QUANTITATIVE: D-Dimer, Quant: 0.74 ug/mL-FEU — ABNORMAL HIGH (ref 0.00–0.50)

## 2018-04-07 LAB — TROPONIN I: Troponin I: <0.03 ng/mL (ref <0.03)

## 2018-04-07 MED ORDER — ALBUTEROL SULFATE HFA 108 (90 BASE) MCG/ACT IN AERS: 2.0000 | INHALATION_SPRAY | Freq: Four times a day (QID) | RESPIRATORY_TRACT | 2 refills | Status: DC | PRN

## 2018-04-07 MED ORDER — ALBUTEROL SULFATE (2.5 MG/3ML) 0.083% IN NEBU
5.0000 mg | INHALATION_SOLUTION | Freq: Once | RESPIRATORY_TRACT | Status: AC
  Administered 2018-04-07: 5 mg via RESPIRATORY_TRACT
  Filled 2018-04-07: qty 6

## 2018-04-07 MED ORDER — ALBUTEROL SULFATE HFA 108 (90 BASE) MCG/ACT IN AERS: 2.0000 | INHALATION_SPRAY | Freq: Four times a day (QID) | RESPIRATORY_TRACT | 0 refills | Status: AC | PRN

## 2018-04-07 MED ORDER — SODIUM CHLORIDE (PF) 0.9 % IJ SOLN
INTRAMUSCULAR | Status: AC
  Filled 2018-04-07: qty 50

## 2018-04-07 MED ORDER — IOHEXOL 350 MG/ML SOLN
100.0000 mL | Freq: Once | INTRAVENOUS | Status: AC | PRN
  Administered 2018-04-07: 100 mL via INTRAVENOUS

## 2018-04-07 NOTE — Discharge Instructions (Addendum)
Use your oxygen at home.   Use albuterol as needed for cough or wheezing.   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 Take tylenol, motrin and your lyrica for pain. If you still have pain in 1-2 weeks, consider repeat ultrasound of your calf   Return to ER if you have worse shortness of breath, chest pain, calf pain, fever.

## 2018-04-07 NOTE — ED Triage Notes (Signed)
Pt arrived via EMS from Home. Pt is c/o SOB x 1 day.Pt is breathing unlabored and is able to talk in full sentences. FD gave pt albuterol treatment prior to EMS arrival.  Pt reported to EMS that she has not slept since Friday, and reported that she was anxious stated " im afraid that my  Brother is going to come out of the box" (referring to her cremated brother) No fever, cough to report.  Pt has hx of anxiety, and bipolar disorder and lives in independent living.

## 2018-04-07 NOTE — ED Notes (Signed)
Bed: WA03 Expected date:  Expected time:  Means of arrival:  Comments: EMS 

## 2018-04-07 NOTE — ED Provider Notes (Signed)
[facility]-EMERGENCY DEPT Provider Note   CSN: [id number] Arrival date & time: 04/07/18  1200    History   Chief Complaint Chief Complaint  Patient presents with  . Shortness of Breath    HPI Lindsey Rowe is a 73 y.o. female.     HPI  73 year old female presents with shortness of breath.  Started yesterday.  She has had a nonproductive cough as well.  No chest pain, fever.  She is been having nausea and decreased appetite over the last 3 days.  She fell injuring her right lower extremity a few days ago.  Now she states it seems swollen.  She is chronically on oxygen and has not had to change this.  Given breathing treatment by EMS with minimal relief.  Past Medical History:  Diagnosis Date  . Arthritis   . Back pain   . Bipolar 1 disorder (HCC)   . Complication of anesthesia    hard to wake up anesthesia  . Depression   . GERD (gastroesophageal reflux disease)   . Headache(784.0)    migraines and tension headaches  . Hyperlipidemia   . Hypertension   . Hypothyroidism   . Thyroid disease     Patient Active Problem List   Diagnosis Date Noted  . Asthmatic bronchitis 12/02/2017  . Acute respiratory failure with hypoxia (HCC) 06/27/2017  . Diastolic dysfunction 06/27/2017  . Acute lower UTI 06/27/2017  . Acute metabolic encephalopathy 06/27/2017  . Altered mental state 06/10/2017  . Dyspnea and respiratory abnormalities 08/29/2016  . Hypoxemia 08/29/2016  . Shoulder dislocation, right, initial encounter 05/24/2016  . 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 Hyperglycemia 05/24/2016  . Anterior dislocation of right shoulder   . Syncope 03/24/2016  . Generalized weakness 09/24/2015  . Leg pain, bilateral 09/24/2015  . Depression 09/24/2015  . Loss of appetite 09/24/2015  . Leg weakness, bilateral   . Hypothyroid 12/28/2012  . Hypovolemia 12/28/2012  . Hypotension 08/13/2012  . Dehydration 08/13/2012  . AKI (acute kidney injury) (HCC)  08/13/2012  . Hypoventilation associated with obesity syndrome (HCC) 08/13/2012  . Acute respiratory failure (HCC) 07/26/2012  . Chest pain 07/26/2012  . ANKLE SPRAIN, RIGHT 03/16/2010  . DIVERTICULOSIS, COLON 10/31/2009  . SKIN LESION 09/23/2009  . Shoulder pain, right 07/05/2009  . COUGH 03/29/2009  . Migraine 03/11/2009  . OTITIS MEDIA, RIGHT 12/13/2008  . BRUISE 12/06/2008  . ACID REFLUX DISEASE 09/13/2008  . ALLERGIC RHINITIS 05/20/2008  . UNSPECIFIED DISEASE OF HAIR AND HAIR FOLLICLES 05/20/2008  . DERMATITIS 11/12/2007  . OBESITY 07/18/2007  . HYPERCHOLESTEROLEMIA 05/13/2007  . BIPOLAR AFFECTIVE DISORDER 05/12/2007  . HYPERTENSION, BENIGN ESSENTIAL 05/12/2007  . Low back pain potentially associated with radiculopathy 05/12/2007    Past Surgical History:  Procedure Laterality Date  . APPENDECTOMY    . BACK SURGERY    . EYE SURGERY Bilateral    lens implant  . GASTRIC BYPASS    . LOOP RECORDER INSERTION N/A 05/25/2016   Procedure: Loop Recorder Insertion;  Surgeon: Will Jorja Loa, MD;  Location: [facility] INVASIVE CV LAB;  Service: Cardiovascular;  Laterality: N/A;  . LUMBAR LAMINECTOMY/DECOMPRESSION MICRODISCECTOMY Right 07/23/2012   Procedure: LUMBAR LAMINECTOMY/DECOMPRESSION MICRODISCECTOMY 2 LEVELS;  Surgeon: Karn Cassis, MD;  Location: [facility] NEURO ORS;  Service: Neurosurgery;  Laterality: Right;  Right Lumbar three-four  lumbar four-five Laminectomy/Foraminotomy  . NASAL SINUS SURGERY Bilateral   . TONSILLECTOMY    . WRIST SURGERY       OB History  No obstetric history on file.      Home Medications    Prior to Admission medications   Medication Sig Start Date End Date Taking? Authorizing Provider  albuterol (PROVENTIL HFA;VENTOLIN HFA) 108 (90 Base) MCG/ACT inhaler Inhale 2 puffs into the lungs every 6 (six) hours as needed for wheezing or shortness of breath. 07/02/17  Yes Rai, Ripudeep K, MD  Aspirin-Salicylamide-Caffeine (BC HEADACHE POWDER PO) Take 1  packet by mouth as needed (headache, pain).   Yes [provider]  Carboxymethylcellulose Sodium (EYE DROPS OP) Apply 1 drop to eye daily as needed (dry eyes).   Yes [provider]  Cholecalciferol (D3-1000) 1000 units capsule Take 1,000 Units by mouth daily.    Yes [provider]  DULoxetine (CYMBALTA) 30 MG capsule Take 1 capsule (30 mg total) by mouth 2 (two) times daily. 05/26/16  Yes Pearson Grippe, MD  DULoxetine (CYMBALTA) 60 MG capsule Take 60 mg by mouth every evening.   Yes [provider]  fluticasone (FLONASE) 50 MCG/ACT nasal spray Place 1 spray into both nostrils daily as needed for allergies or rhinitis. 06/29/17  Yes Rai, Ripudeep K, MD  levothyroxine (SYNTHROID, LEVOTHROID) 75 MCG tablet Take 75 mcg by mouth daily before breakfast.  09/06/15  Yes [provider]  lisinopril (PRINIVIL,ZESTRIL) 10 MG tablet Take 10 mg by mouth daily.  08/15/16  Yes [provider]  loperamide (IMODIUM) 2 MG capsule Take 1 capsule (2 mg total) by mouth 3 (three) times daily as needed for diarrhea or loose stools. 07/02/17  Yes Rai, Ripudeep K, MD  meloxicam (MOBIC) 15 MG tablet Take 15 mg by mouth daily as needed for pain.   Yes [provider]  metoprolol succinate (TOPROL-XL) 50 MG 24 hr tablet Take 1 tablet (50 mg total) by mouth daily. Take with or immediately following a meal. 05/20/17 04/07/18 Yes Camnitz, Will Daphine Deutscher, MD  omeprazole (PRILOSEC) 20 MG capsule Take 20 mg by mouth 2 (two) times daily before a meal.   Yes [provider]  OVER THE COUNTER MEDICATION Place 1 application into both eyes 2 (two) times daily as needed (dry eyes). Eye Gel   Yes [provider]  pravastatin (PRAVACHOL) 40 MG tablet Take 40 mg by mouth daily.   Yes [provider]  pregabalin (LYRICA) 100 MG capsule Take 1 capsule (100 mg total) by mouth 2 (two) times daily. 09/27/15  Yes Marlin Canary U, DO  PROLIA 60 MG/ML SOSY injection  01/09/18   Yes [provider]  RESTASIS 0.05 % ophthalmic emulsion Place 1 drop into both eyes 2 (two) times daily.  02/19/18  Yes [provider]  vitamin B-12 (CYANOCOBALAMIN) 1000 MCG tablet Take 1,000 mcg by mouth daily.   Yes [provider]  Vitamin D, Ergocalciferol, (DRISDOL) 1.25 MG (50000 UT) CAPS capsule Take 50,000 Units by mouth once a week. 03/03/18  Yes [provider]    Family History Family History  Problem Relation Age of Onset  . Heart disease Mother   . Emphysema Father   . Diabetes Sister   . Other Brother        Brain tumor - s/p excision    Social History Social History   Tobacco Use  . Smoking status: Never Smoker  . Smokeless tobacco: Never Used  Substance Use Topics  . Alcohol use: Yes    Comment: social  . Drug use: No    Comment: Pt + opioids as prescribed  Allergies   Gabapentin and Cefadroxil   Review of Systems Review of Systems  Constitutional: Negative for fever.  Respiratory: Positive for cough and shortness of breath.   Cardiovascular: Positive for leg swelling. Negative for chest pain.  Gastrointestinal: Positive for nausea. Negative for abdominal pain and vomiting.  All other systems reviewed and are negative.    Physical Exam Updated Vital Signs BP (!) 154/87   Pulse 73   Temp 98.2 F (36.8 C) (Oral)   Resp 11   SpO2 98%   Physical Exam Vitals signs and nursing note reviewed.  Constitutional:      General: She is not in acute distress.    Appearance: She is well-developed. She is obese.  HENT:     Head: Normocephalic and atraumatic.     Right Ear: External ear normal.     Left Ear: External ear normal.     Nose: Nose normal.  Eyes:     General:        Right eye: No discharge.        Left eye: No discharge.  Cardiovascular:     Rate and Rhythm: Normal rate and regular rhythm.     Pulses:          Dorsalis pedis pulses are 2+ on the right side and 2+ on the left side.     Heart  sounds: Normal heart sounds.  Pulmonary:     Effort: Pulmonary effort is normal. No tachypnea, accessory muscle usage or respiratory distress.     Breath sounds: Normal breath sounds. No wheezing, rhonchi or rales.  Abdominal:     Palpations: Abdomen is soft.     Tenderness: There is no abdominal tenderness.  Musculoskeletal:     Right lower leg: She exhibits tenderness and swelling.       Legs:  Skin:    General: Skin is warm and dry.  Neurological:     Mental Status: She is alert.  Psychiatric:        Mood and Affect: Mood is not anxious.      ED Treatments / Results  Labs (all labs ordered are listed, but only abnormal results are displayed) Labs Reviewed  BASIC METABOLIC PANEL - Abnormal; Notable for the following components:      Result Value   Glucose, Bld 109 (*)    Calcium 8.7 (*)    All other components within normal limits  CBC WITH DIFFERENTIAL/PLATELET - Abnormal; Notable for the following components:   RBC 3.81 (*)    Hemoglobin 11.6 (*)    All other components within normal limits  D-DIMER, QUANTITATIVE (NOT AT [facility]) - Abnormal; Notable for the following components:   D-Dimer, Quant 0.74 (*)    All other components within normal limits  TROPONIN I  BRAIN NATRIURETIC PEPTIDE    EKG EKG Interpretation  Date/Time:  Monday April 07 2018 13:16:06 EST Ventricular Rate:  92 PR Interval:    QRS Duration: 85 QT Interval:  333 QTC Calculation: 412 R Axis:   46 Text Interpretation:  Sinus rhythm Low voltage, extremity leads Interpretation limited secondary to artifact otherwise similar to May 2019 Confirmed by Pricilla Loveless [phone]) on 04/07/2018 1:23:43 PM Also confirmed by Pricilla Loveless [phone]), editor Barbette Hair [phone])  on 04/07/2018 2:16:00 PM   Radiology Dg Chest 2 View  Result Date: 04/07/2018 CLINICAL DATA:  Dyspnea, recent fall EXAM: CHEST - 2 VIEW COMPARISON:  06/26/2017 chest radiograph. FINDINGS: Stable cardiomediastinal silhouette with  top-normal heart size. No  pneumothorax. No pleural effusion. Faint patchy right lower lung opacity. Otherwise clear lungs. IMPRESSION: Faint patchy right lower lung opacity, can not exclude developing pneumonia. Recommend short-term follow-up PA and lateral chest radiographs. Electronically Signed   By: Delbert Phenix M.D.   On: 04/07/2018 15:34   Dg Tibia/fibula Right  Result Date: 04/07/2018 CLINICAL DATA:  73 year old female status post fall several days ago with continued right lower leg pain. EXAM: RIGHT TIBIA AND FIBULA - 2 VIEW COMPARISON:  Right ankle series 06/28/2017 and earlier. FINDINGS: The right tibia and fibula appear intact. Preserved alignment at the visible right knee and ankle. Chronic degenerative spurring of the calcaneus. No discrete soft tissue injury identified. IMPRESSION: No acute osseous abnormality identified about the right tib-fib. Electronically Signed   By: Odessa Fleming M.D.   On: 04/07/2018 15:36   Vas [place] Lower Extremity Venous (dvt) (only [facility] & [facility] 7a-7p)  Result Date: 04/07/2018  Lower Venous Study Indications: Edema, Pain, and SOB.  Limitations: Body habitus. Comparison Study: Negative LLEV study on 04/20/2016 Performing Technologist: Melodie Bouillon  Examination Guidelines: A complete evaluation includes B-mode imaging, spectral Doppler, color Doppler, and power Doppler as needed of all accessible portions of each vessel. Bilateral testing is considered an integral part of a complete examination. Limited examinations for reoccurring indications may be performed as noted.  Right Venous Findings: +---------+---------------+---------+-----------+----------+-------------------+          CompressibilityPhasicitySpontaneityPropertiesSummary             +---------+---------------+---------+-----------+----------+-------------------+ CFV      Full           Yes      Yes                                       +---------+---------------+---------+-----------+----------+-------------------+ SFJ      Full                                                             +---------+---------------+---------+-----------+----------+-------------------+ FV Prox  Full                                                             +---------+---------------+---------+-----------+----------+-------------------+ FV Mid   Full                                                             +---------+---------------+---------+-----------+----------+-------------------+ FV DistalFull                                                             +---------+---------------+---------+-----------+----------+-------------------+ PFV  Not visualized      +---------+---------------+---------+-----------+----------+-------------------+ POP      Full           Yes      Yes                  slightly dilated.   +---------+---------------+---------+-----------+----------+-------------------+ PTV                                                   not well visualized                                                       with compression    +---------+---------------+---------+-----------+----------+-------------------+ PERO                                                  not well visualized                                                       with compression    +---------+---------------+---------+-----------+----------+-------------------+  Left Venous Findings: +---+---------------+---------+-----------+----------+-------+    CompressibilityPhasicitySpontaneityPropertiesSummary +---+---------------+---------+-----------+----------+-------+ CFVFull           Yes      Yes                          +---+---------------+---------+-----------+----------+-------+    Summary: Right: There is no evidence of deep vein thrombosis in the  lower extremity. However, portions of this examination were limited- see technologist comments above. No cystic structure found in the popliteal fossa. Area of sharp pain: Right proximal calf region laterally, there is a 0.82x1.48cm hypoechoic area seen, mightbe the torn muscular. Left: No evidence of common femoral vein obstruction. There is no evidence of deep vein thrombosis in the lower extremity. However, portions of this examination were limited- see technologist comments above.  *See table(s) above for measurements and observations.    Preliminary     Procedures Procedures (including critical care time)  Medications Ordered in ED Medications  albuterol (PROVENTIL) (2.5 MG/3ML) 0.083% nebulizer solution 5 mg (5 mg Nebulization Given 04/07/18 1607)     Initial Impression / Assessment and Plan / ED Course  I have reviewed the triage vital signs and the nursing notes.  Pertinent labs & imaging results that were available during my care of the patient were reviewed by me and considered in my medical decision making (see chart for details).        No clear cause for her dyspnea but she overall appears pretty well.  Labs are at baseline.  Mildly elevated d-dimer so CT scan of the chest will be obtained.  This will also help delineate whether or not there is a true pneumonia.  The pain in her right leg and swelling is probably more from trauma though it cannot have DVT ruled out based on the ultrasound today.  She will need a repeat assuming the CTA is negative.  I think ACS is pretty unlikely.  Care transferred to Dr. Silverio Lay.  Final Clinical Impressions(s) / ED Diagnoses   Final diagnoses:  None    ED Discharge Orders    None       Pricilla Loveless, MD 04/07/18 [phone]

## 2018-04-07 NOTE — ED Notes (Signed)
[facility] called for transport.  

## 2018-04-07 NOTE — Progress Notes (Signed)
Right lower extremity venous duplex exam completed. Please see preliminary notes on CV PROC under chart review. Kelvis Berger H Kosha Jaquith(RDMS RVT) 04/07/18 3:52 PM

## 2018-04-07 NOTE — ED Provider Notes (Signed)
  Physical Exam  BP (!) 154/87   Pulse 73   Temp 98.2 F (36.8 C) (Oral)   Resp 11   SpO2 98%   Physical Exam  ED Course/Procedures     Procedures  MDM  Patient care assumed at 4 PM.  Patient has some subjective shortness of breath and is chronically on oxygen.  Patient apparently had a panic attack.  Her chest x-ray showed possible pneumonia but she has no cough or fever and no leukocytosis. DVT study limited but showed possible muscle tear.  Signout pending a CTA chest.  6:53 PM CTA showed slightly enlarged thoracic aneurysm of 4.5. No dissection or pneumonia. Stable on 2 L [place] which is home oxygen. Will have her get follow up DVT study in several weeks given limited study. She already has CT surgery follow up for the known aneurysm.     Charlynne Pander, MD 04/07/18 [phone]

## 2018-04-08 DIAGNOSIS — M4800 Spinal stenosis, site unspecified: Secondary | ICD-10-CM | POA: Diagnosis not present

## 2018-04-09 DIAGNOSIS — M4800 Spinal stenosis, site unspecified: Secondary | ICD-10-CM | POA: Diagnosis not present

## 2018-04-10 DIAGNOSIS — M4800 Spinal stenosis, site unspecified: Secondary | ICD-10-CM | POA: Diagnosis not present

## 2018-04-11 DIAGNOSIS — M4800 Spinal stenosis, site unspecified: Secondary | ICD-10-CM | POA: Diagnosis not present

## 2018-04-12 DIAGNOSIS — M4800 Spinal stenosis, site unspecified: Secondary | ICD-10-CM | POA: Diagnosis not present

## 2018-04-13 DIAGNOSIS — M4800 Spinal stenosis, site unspecified: Secondary | ICD-10-CM | POA: Diagnosis not present

## 2018-04-14 DIAGNOSIS — M4800 Spinal stenosis, site unspecified: Secondary | ICD-10-CM | POA: Diagnosis not present

## 2018-04-15 ENCOUNTER — Ambulatory Visit: Payer: Self-pay

## 2018-04-15 DIAGNOSIS — M4800 Spinal stenosis, site unspecified: Secondary | ICD-10-CM | POA: Diagnosis not present

## 2018-04-16 DIAGNOSIS — M4800 Spinal stenosis, site unspecified: Secondary | ICD-10-CM | POA: Diagnosis not present

## 2018-04-17 DIAGNOSIS — M4800 Spinal stenosis, site unspecified: Secondary | ICD-10-CM | POA: Diagnosis not present

## 2018-04-18 DIAGNOSIS — M4800 Spinal stenosis, site unspecified: Secondary | ICD-10-CM | POA: Diagnosis not present

## 2018-04-19 DIAGNOSIS — M4800 Spinal stenosis, site unspecified: Secondary | ICD-10-CM | POA: Diagnosis not present

## 2018-04-20 DIAGNOSIS — M4800 Spinal stenosis, site unspecified: Secondary | ICD-10-CM | POA: Diagnosis not present

## 2018-04-21 DIAGNOSIS — M4800 Spinal stenosis, site unspecified: Secondary | ICD-10-CM | POA: Diagnosis not present

## 2018-04-22 ENCOUNTER — Ambulatory Visit (INDEPENDENT_AMBULATORY_CARE_PROVIDER_SITE_OTHER): Payer: Medicare HMO | Admitting: *Deleted

## 2018-04-22 DIAGNOSIS — R55 Syncope and collapse: Secondary | ICD-10-CM

## 2018-04-22 DIAGNOSIS — M4800 Spinal stenosis, site unspecified: Secondary | ICD-10-CM | POA: Diagnosis not present

## 2018-04-23 DIAGNOSIS — M4800 Spinal stenosis, site unspecified: Secondary | ICD-10-CM | POA: Diagnosis not present

## 2018-04-24 DIAGNOSIS — M4800 Spinal stenosis, site unspecified: Secondary | ICD-10-CM | POA: Diagnosis not present

## 2018-04-24 LAB — CUP PACEART REMOTE DEVICE CHECK
Date Time Interrogation Session: 20200317224009
Implantable Pulse Generator Implant Date: 20180420

## 2018-04-25 DIAGNOSIS — M4800 Spinal stenosis, site unspecified: Secondary | ICD-10-CM | POA: Diagnosis not present

## 2018-04-26 DIAGNOSIS — M4800 Spinal stenosis, site unspecified: Secondary | ICD-10-CM | POA: Diagnosis not present

## 2018-04-27 DIAGNOSIS — M4800 Spinal stenosis, site unspecified: Secondary | ICD-10-CM | POA: Diagnosis not present

## 2018-04-28 ENCOUNTER — Ambulatory Visit: Payer: Self-pay | Admitting: Pulmonary Disease

## 2018-04-30 NOTE — Progress Notes (Signed)
Carelink Summary Report / Loop Recorder 

## 2018-05-05 DIAGNOSIS — M810 Age-related osteoporosis without current pathological fracture: Secondary | ICD-10-CM | POA: Diagnosis not present

## 2018-05-05 DIAGNOSIS — R0902 Hypoxemia: Secondary | ICD-10-CM | POA: Diagnosis not present

## 2018-05-05 DIAGNOSIS — M4800 Spinal stenosis, site unspecified: Secondary | ICD-10-CM | POA: Diagnosis not present

## 2018-05-05 DIAGNOSIS — R0789 Other chest pain: Secondary | ICD-10-CM | POA: Diagnosis not present

## 2018-05-05 DIAGNOSIS — J9691 Respiratory failure, unspecified with hypoxia: Secondary | ICD-10-CM | POA: Diagnosis not present

## 2018-05-05 DIAGNOSIS — J9601 Acute respiratory failure with hypoxia: Secondary | ICD-10-CM | POA: Diagnosis not present

## 2018-05-05 DIAGNOSIS — J45909 Unspecified asthma, uncomplicated: Secondary | ICD-10-CM | POA: Diagnosis not present

## 2018-05-05 DIAGNOSIS — J449 Chronic obstructive pulmonary disease, unspecified: Secondary | ICD-10-CM | POA: Diagnosis not present

## 2018-05-15 ENCOUNTER — Telehealth: Payer: Self-pay | Admitting: *Deleted

## 2018-05-15 NOTE — Telephone Encounter (Signed)
Called patient to let them know due to recent COVID19 CDC and Health Department Protocols, we are not seeing patients in the office. We are instead seeing if they would like to schedule this appointment as a Virtual Appointment VIA Smartphone or Laptop. Unable to reach patient. LVMTCB  

## 2018-05-16 NOTE — Telephone Encounter (Signed)
                                1. Confirm consent - Calling patient to let them know due to recent COVID19 CDC and Health Department Protocols, we are not seeing patients in the office. We are instead seeing if they would like to schedule this appointment as a  Geographical information systems officer or Laptop. Patient is aware if they decide to reschedule this appointment, they may not be seen or scheduled for the next 4-6 months. As with many in-office visits we must obtain consent. If you'd like, we can send this to patient's mychart (if signed up).  Otherwise, we can obtain verbal consent now.  All virtual visits are billed to patient's insurance company just like a normal visit.  By patient agreeing to a virtual visit, patient is aware that the technology does not allow provider to perform a hands on physical examination, and thus may limit your provider's ability to fully assess your condition.  Patient is aware we cannot assure that it will always work on either patient or our end, and in the setting of a video visit, we may have to convert it to a phone-only visit.  In either situation, we cannot ensure that we have a secure connection. Is patient willing to proceed?  YES   2. Give patient instructions for WebEx/Doximity download to smartphone as below if video visit   3. Patient advised to be prepared with any vital sign or heart rhythm information, their current medicines, and paper and pen handy for any instructions they may receive before, during, or after their visit.   4. Patient informed they will receive a phone call 10-15 minutes prior to their appointment time (may be from unknown caller ID) so they should be prepared to answer    ACCEPTING  DOXIMITY VIDEO VISITS  - Patient will receive a text message from a Random Number indicating Provider's Name and a Blue Hyperlink.  Text Message will say Provider sent you a secure message with a Blue Hyperlink Attached  -Patient is to click on  [facility] Hyperlink Text message will say "Provider will see you now. Click to join Video call." with another Blue Hyperlink attached.  -Patient is to click Blue hyperlink to [employer] call with said Provider.   Was not able to upload WebEx.

## 2018-05-23 ENCOUNTER — Telehealth: Payer: Self-pay | Admitting: Cardiology

## 2018-05-23 ENCOUNTER — Telehealth: Payer: Self-pay

## 2018-05-23 ENCOUNTER — Other Ambulatory Visit: Payer: Self-pay | Admitting: Cardiology

## 2018-05-23 NOTE — Telephone Encounter (Signed)
Spoke with patient to remind of missed remote transmission 

## 2018-05-23 NOTE — Telephone Encounter (Signed)
New message:   Patient calling to report a trannsmention

## 2018-05-23 NOTE — Telephone Encounter (Signed)
Monitor now connected. Pt aware  Gypsy Balsam, NP 05/23/2018 11:39 AM

## 2018-05-26 ENCOUNTER — Ambulatory Visit (INDEPENDENT_AMBULATORY_CARE_PROVIDER_SITE_OTHER): Payer: Medicare HMO | Admitting: *Deleted

## 2018-05-26 ENCOUNTER — Ambulatory Visit: Payer: Self-pay | Admitting: Pulmonary Disease

## 2018-05-26 ENCOUNTER — Other Ambulatory Visit: Payer: Self-pay

## 2018-05-26 ENCOUNTER — Encounter: Payer: Self-pay | Admitting: Cardiology

## 2018-05-26 ENCOUNTER — Telehealth (INDEPENDENT_AMBULATORY_CARE_PROVIDER_SITE_OTHER): Payer: Medicare HMO | Admitting: Cardiology

## 2018-05-26 DIAGNOSIS — E785 Hyperlipidemia, unspecified: Secondary | ICD-10-CM

## 2018-05-26 DIAGNOSIS — R55 Syncope and collapse: Secondary | ICD-10-CM

## 2018-05-26 DIAGNOSIS — I10 Essential (primary) hypertension: Secondary | ICD-10-CM

## 2018-05-26 DIAGNOSIS — I471 Supraventricular tachycardia: Secondary | ICD-10-CM | POA: Diagnosis not present

## 2018-05-26 LAB — CUP PACEART REMOTE DEVICE CHECK
Date Time Interrogation Session: 20200419234133
Implantable Pulse Generator Implant Date: 20180420

## 2018-05-26 MED ORDER — METOPROLOL SUCCINATE ER 50 MG PO TB24: 50.0000 mg | ORAL_TABLET | Freq: Every day | ORAL | 3 refills | Status: DC

## 2018-05-26 NOTE — Addendum Note (Signed)
Addended by: Baird Lyons on: 05/26/2018 11:51 AM   Modules accepted: Orders

## 2018-05-26 NOTE — Addendum Note (Signed)
Addended by: Baird Lyons on: 05/26/2018 11:58 AM   Modules accepted: Orders

## 2018-05-26 NOTE — Progress Notes (Signed)
Electrophysiology TeleHealth Note   Due to national recommendations of social distancing due to COVID 19, an audio/video telehealth visit is felt to be most appropriate for this patient at this time.  See Epic message for the patient's consent to telehealth for [facility].   Date:  05/26/2018   ID:  Lindsey Rowe, DOB 01/05/1946, MRN [id number]  Location: patient's home  Provider location: [address], [place]  Evaluation Performed: Follow-up visit  PCP:  Maurice SmallGriffin, Elaine, MD  Cardiologist:  Tresa EndoKelly Electrophysiologist:  Dr Elberta Fortisamnitz  Chief Complaint:  syncope  History of Present Illness:    Lindsey Rowe is a 73 y.o. female who presents via audio/video conferencing for a telehealth visit today.  Since last being seen in our clinic, the patient reports doing very well.  Today, she denies symptoms of palpitations, chest pain, shortness of breath,  lower extremity edema, dizziness, presyncope, or syncope.  The patient is otherwise without complaint today.  The patient denies symptoms of fevers, chills, cough, or new SOB worrisome for COVID 19.  Lindsey Rowe is a 73 y.o. female who is being seen today for the evaluation of syncope at the request of Daphene Jaegerom Kelly.  She has a history of hypertension and hyperlipidemia.  She had an episode of syncope and had a shoulder dislocation.  She thus had a Linq monitor implanted.  Today, denies symptoms of palpitations, chest pain, shortness of breath, orthopnea, PND, lower extremity edema, claudication, dizziness, presyncope, syncope, bleeding, or neurologic sequela. The patient is tolerating medications without difficulties.  She is overall doing well.  She has had no chest pain or shortness of breath.  She has had no recurrent episodes of syncope.  She did have an ER visit approximately 1 month ago with shortness of breath.  She got 2 breathing treatments with greatly improved her shortness of breath.  She is feeling back to  normal.  Past Medical History:  Diagnosis Date  . Arthritis   . Back pain   . Bipolar 1 disorder (HCC)   . Complication of anesthesia    hard to wake up anesthesia  . Depression   . GERD (gastroesophageal reflux disease)   . Headache(784.0)    migraines and tension headaches  . Hyperlipidemia   . Hypertension   . Hypothyroidism   . Thyroid disease     Past Surgical History:  Procedure Laterality Date  . APPENDECTOMY    . BACK SURGERY    . EYE SURGERY Bilateral    lens implant  . GASTRIC BYPASS    . LOOP RECORDER INSERTION N/A 05/25/2016   Procedure: Loop Recorder Insertion;  Surgeon: Caroll Weinheimer Jorja LoaMartin Tahani Potier, MD;  [facility];  Service: Cardiovascular;  Laterality: N/A;  . LUMBAR LAMINECTOMY/DECOMPRESSION MICRODISCECTOMY Right 07/23/2012   Procedure: LUMBAR LAMINECTOMY/DECOMPRESSION MICRODISCECTOMY 2 LEVELS;  Surgeon: Karn CassisErnesto M Botero, MD;  Location: [facility];  Service: Neurosurgery;  Laterality: Right;  Right Lumbar three-four  lumbar four-five Laminectomy/Foraminotomy  . NASAL SINUS SURGERY Bilateral   . TONSILLECTOMY    . WRIST SURGERY      Current Outpatient Medications  Medication Sig Dispense Refill  . albuterol (PROVENTIL HFA;VENTOLIN HFA) 108 (90 Base) MCG/ACT inhaler Inhale 2 puffs into the lungs every 6 (six) hours as needed for wheezing or shortness of breath. 1 Inhaler 0  . Aspirin-Salicylamide-Caffeine (BC HEADACHE POWDER PO) Take 1 packet by mouth as needed (headache, pain).    . Carboxymethylcellulose Sodium (EYE DROPS OP)  Apply 1 drop to eye daily as needed (dry eyes).    . Cholecalciferol (D3-1000) 1000 units capsule Take 1,000 Units by mouth daily.     . clonazePAM (KLONOPIN) 0.5 MG tablet Take 0.5 mg by mouth at bedtime as needed for anxiety.    . DULoxetine (CYMBALTA) 30 MG capsule Take 1 capsule (30 mg total) by mouth 2 (two) times daily. 60 capsule 0  . DULoxetine (CYMBALTA) 60 MG capsule Take 60 mg by mouth every evening.    .  fluticasone (FLONASE) 50 MCG/ACT nasal spray Place 1 spray into both nostrils daily as needed for allergies or rhinitis. 16 g 2  . levothyroxine (SYNTHROID, LEVOTHROID) 75 MCG tablet Take 75 mcg by mouth daily before breakfast.     . lisinopril (PRINIVIL,ZESTRIL) 10 MG tablet Take 10 mg by mouth daily.     [name] loperamide (IMODIUM) 2 MG capsule Take 1 capsule (2 mg total) by mouth 3 (three) times daily as needed for diarrhea or loose stools. 30 capsule 0  . meloxicam (MOBIC) 15 MG tablet Take 15 mg by mouth daily as needed for pain.    [name] omeprazole (PRILOSEC) 20 MG capsule Take 20 mg by mouth 2 (two) times daily before a meal.    . OVER THE COUNTER MEDICATION Place 1 application into both eyes 2 (two) times daily as needed (dry eyes). Eye Gel    . pravastatin (PRAVACHOL) 40 MG tablet Take 40 mg by mouth daily.    . pregabalin (LYRICA) 100 MG capsule Take 1 capsule (100 mg total) by mouth 2 (two) times daily.    [name] PROLIA 60 MG/ML SOSY injection     . RESTASIS 0.05 % ophthalmic emulsion Place 1 drop into both eyes 2 (two) times daily.     . vitamin B-12 (CYANOCOBALAMIN) 1000 MCG tablet Take 1,000 mcg by mouth daily.    . Vitamin D, Ergocalciferol, (DRISDOL) 1.25 MG (50000 UT) CAPS capsule Take 50,000 Units by mouth once a week.    . metoprolol succinate (TOPROL-XL) 50 MG 24 hr tablet Take 1 tablet (50 mg total) by mouth daily. Take with or immediately following a meal. 90 tablet 3   No current facility-administered medications for this visit.     Allergies:   Gabapentin and Cefadroxil   Social History:  The patient  reports that she has never smoked. She has never used smokeless tobacco. She reports current alcohol use. She reports that she does not use drugs.   Family History:  The patient's  family history includes Diabetes in her sister; Emphysema in her father; Heart disease in her mother; Other in her brother.   ROS:  Please see the history of present illness.   All other systems are  personally reviewed and negative.    Exam:    Vital Signs:  There were no vitals taken for this visit.  Well appearing, alert and conversant, regular work of breathing,  good skin color Eyes- anicteric, neuro- grossly intact, skin- no apparent rash or lesions or cyanosis, mouth- oral mucosa is pink   Labs/Other Tests and Data Reviewed:    Recent Labs: 06/27/2017: ALT 14; TSH 2.955 04/07/2018: B Natriuretic Peptide 79.7; BUN 10; Creatinine, Ser 0.88; Hemoglobin 11.6; Platelets 224; Potassium 4.2; Sodium 140   Wt Readings from Last 3 Encounters:  12/02/17 265 lb (120.2 kg)  08/14/17 268 lb (121.6 kg)  07/03/17 255 lb 4.7 oz (115.8 kg)     Other studies personally reviewed: Additional studies/ records that were  reviewed today include: ECG 04/07/2018 Review of the above records today demonstrates:   Sinus rhythm, rate 92  Last device remote is reviewed from [employer] PDF dated 04/24/18 which reveals normal device function, no arrhythmias    ASSESSMENT & PLAN:    1.  Recurrent syncope: No further episodes of syncope.  Linq monitor shows no arrhythmias.  2.  Hypertension: Has been well controlled in the past.  The patient has not checked her blood pressure recently.  3.  Hyperlipidemia: Continue pravastatin per primary care  4.  SVT: Found on link monitor.  Has been put on Toprol-XL.  None further since the increased dose of Toprol-XL   COVID 19 screen The patient denies symptoms of COVID 19 at this time.  The importance of social distancing was discussed today.  Follow-up: 1 year   Current medicines are reviewed at length with the patient today.   The patient does not have concerns regarding her medicines.  The following changes were made today:  none  Labs/ tests ordered today include:  No orders of the defined types were placed in this encounter.    Patient Risk:  after full review of this patients clinical status, I feel that they are at moderate risk at this time.   Today, I have spent 12 minutes with the patient with telehealth technology discussing syncope .    Signed, Kamy Poinsett Jorja Loa, MD  05/26/2018 11:37 AM     [facility] [address] [place] [address] [phone] (office) [phone] (fax)

## 2018-06-04 NOTE — Progress Notes (Signed)
Carelink Summary Report / Loop Recorder 

## 2018-06-05 DIAGNOSIS — J9601 Acute respiratory failure with hypoxia: Secondary | ICD-10-CM | POA: Diagnosis not present

## 2018-06-05 DIAGNOSIS — J45909 Unspecified asthma, uncomplicated: Secondary | ICD-10-CM | POA: Diagnosis not present

## 2018-06-05 DIAGNOSIS — R0902 Hypoxemia: Secondary | ICD-10-CM | POA: Diagnosis not present

## 2018-06-12 ENCOUNTER — Ambulatory Visit: Payer: Self-pay

## 2018-06-27 ENCOUNTER — Ambulatory Visit (INDEPENDENT_AMBULATORY_CARE_PROVIDER_SITE_OTHER): Payer: Medicare HMO | Admitting: *Deleted

## 2018-06-27 DIAGNOSIS — R55 Syncope and collapse: Secondary | ICD-10-CM

## 2018-06-28 LAB — CUP PACEART REMOTE DEVICE CHECK
Date Time Interrogation Session: 20200522234205
Implantable Pulse Generator Implant Date: 20180420

## 2018-07-05 DIAGNOSIS — R0902 Hypoxemia: Secondary | ICD-10-CM | POA: Diagnosis not present

## 2018-07-05 DIAGNOSIS — J9601 Acute respiratory failure with hypoxia: Secondary | ICD-10-CM | POA: Diagnosis not present

## 2018-07-05 DIAGNOSIS — J45909 Unspecified asthma, uncomplicated: Secondary | ICD-10-CM | POA: Diagnosis not present

## 2018-07-07 DIAGNOSIS — M4800 Spinal stenosis, site unspecified: Secondary | ICD-10-CM | POA: Diagnosis not present

## 2018-07-08 DIAGNOSIS — M4800 Spinal stenosis, site unspecified: Secondary | ICD-10-CM | POA: Diagnosis not present

## 2018-07-08 NOTE — Progress Notes (Signed)
Carelink Summary Report / Loop Recorder 

## 2018-07-09 DIAGNOSIS — J069 Acute upper respiratory infection, unspecified: Secondary | ICD-10-CM | POA: Diagnosis not present

## 2018-07-09 DIAGNOSIS — J449 Chronic obstructive pulmonary disease, unspecified: Secondary | ICD-10-CM | POA: Diagnosis not present

## 2018-07-09 DIAGNOSIS — M4800 Spinal stenosis, site unspecified: Secondary | ICD-10-CM | POA: Diagnosis not present

## 2018-07-10 DIAGNOSIS — M4800 Spinal stenosis, site unspecified: Secondary | ICD-10-CM | POA: Diagnosis not present

## 2018-07-11 DIAGNOSIS — M4800 Spinal stenosis, site unspecified: Secondary | ICD-10-CM | POA: Diagnosis not present

## 2018-07-12 DIAGNOSIS — M4800 Spinal stenosis, site unspecified: Secondary | ICD-10-CM | POA: Diagnosis not present

## 2018-07-13 DIAGNOSIS — M4800 Spinal stenosis, site unspecified: Secondary | ICD-10-CM | POA: Diagnosis not present

## 2018-07-14 DIAGNOSIS — M4800 Spinal stenosis, site unspecified: Secondary | ICD-10-CM | POA: Diagnosis not present

## 2018-07-15 DIAGNOSIS — M4800 Spinal stenosis, site unspecified: Secondary | ICD-10-CM | POA: Diagnosis not present

## 2018-07-16 DIAGNOSIS — M4800 Spinal stenosis, site unspecified: Secondary | ICD-10-CM | POA: Diagnosis not present

## 2018-07-17 DIAGNOSIS — M4800 Spinal stenosis, site unspecified: Secondary | ICD-10-CM | POA: Diagnosis not present

## 2018-07-18 DIAGNOSIS — M4800 Spinal stenosis, site unspecified: Secondary | ICD-10-CM | POA: Diagnosis not present

## 2018-07-19 DIAGNOSIS — M4800 Spinal stenosis, site unspecified: Secondary | ICD-10-CM | POA: Diagnosis not present

## 2018-07-20 DIAGNOSIS — M4800 Spinal stenosis, site unspecified: Secondary | ICD-10-CM | POA: Diagnosis not present

## 2018-07-30 ENCOUNTER — Ambulatory Visit (INDEPENDENT_AMBULATORY_CARE_PROVIDER_SITE_OTHER): Payer: Medicare HMO | Admitting: *Deleted

## 2018-07-30 DIAGNOSIS — R55 Syncope and collapse: Secondary | ICD-10-CM

## 2018-07-31 ENCOUNTER — Telehealth: Payer: Self-pay

## 2018-07-31 LAB — CUP PACEART REMOTE DEVICE CHECK
Date Time Interrogation Session: 20200625000811
Implantable Pulse Generator Implant Date: 20180420

## 2018-07-31 NOTE — Telephone Encounter (Signed)
Pt states she has not been sleeping in her bed, has been sleeping on the couch. Pt states she will move home monitor to where she has been sleeping. 1 Tachy episode on 07/02/18 @ 1110, 13min 14sec; pt denies symptoms.

## 2018-08-04 DIAGNOSIS — M4800 Spinal stenosis, site unspecified: Secondary | ICD-10-CM | POA: Diagnosis not present

## 2018-08-05 DIAGNOSIS — J45909 Unspecified asthma, uncomplicated: Secondary | ICD-10-CM | POA: Diagnosis not present

## 2018-08-05 DIAGNOSIS — M4800 Spinal stenosis, site unspecified: Secondary | ICD-10-CM | POA: Diagnosis not present

## 2018-08-05 DIAGNOSIS — J9601 Acute respiratory failure with hypoxia: Secondary | ICD-10-CM | POA: Diagnosis not present

## 2018-08-05 DIAGNOSIS — R0902 Hypoxemia: Secondary | ICD-10-CM | POA: Diagnosis not present

## 2018-08-06 DIAGNOSIS — M4800 Spinal stenosis, site unspecified: Secondary | ICD-10-CM | POA: Diagnosis not present

## 2018-08-07 DIAGNOSIS — M4800 Spinal stenosis, site unspecified: Secondary | ICD-10-CM | POA: Diagnosis not present

## 2018-08-08 DIAGNOSIS — M4800 Spinal stenosis, site unspecified: Secondary | ICD-10-CM | POA: Diagnosis not present

## 2018-08-09 DIAGNOSIS — M4800 Spinal stenosis, site unspecified: Secondary | ICD-10-CM | POA: Diagnosis not present

## 2018-08-10 DIAGNOSIS — M4800 Spinal stenosis, site unspecified: Secondary | ICD-10-CM | POA: Diagnosis not present

## 2018-08-11 DIAGNOSIS — M4800 Spinal stenosis, site unspecified: Secondary | ICD-10-CM | POA: Diagnosis not present

## 2018-08-11 NOTE — Progress Notes (Signed)
Carelink Summary Report / Loop Recorder 

## 2018-08-12 DIAGNOSIS — M4800 Spinal stenosis, site unspecified: Secondary | ICD-10-CM | POA: Diagnosis not present

## 2018-08-13 DIAGNOSIS — M4800 Spinal stenosis, site unspecified: Secondary | ICD-10-CM | POA: Diagnosis not present

## 2018-08-14 DIAGNOSIS — M4800 Spinal stenosis, site unspecified: Secondary | ICD-10-CM | POA: Diagnosis not present

## 2018-08-14 DIAGNOSIS — M25531 Pain in right wrist: Secondary | ICD-10-CM | POA: Diagnosis not present

## 2018-08-14 DIAGNOSIS — M170 Bilateral primary osteoarthritis of knee: Secondary | ICD-10-CM | POA: Diagnosis not present

## 2018-08-15 ENCOUNTER — Emergency Department (HOSPITAL_COMMUNITY): Payer: Medicare HMO

## 2018-08-15 ENCOUNTER — Encounter (HOSPITAL_COMMUNITY): Payer: Self-pay | Admitting: Emergency Medicine

## 2018-08-15 ENCOUNTER — Emergency Department (HOSPITAL_COMMUNITY)
Admission: EM | Admit: 2018-08-15 | Discharge: 2018-08-16 | Disposition: A | Discharge status: Home / Self Care | Payer: Medicare HMO | Attending: Emergency Medicine | Admitting: Emergency Medicine

## 2018-08-15 ENCOUNTER — Other Ambulatory Visit: Payer: Self-pay

## 2018-08-15 DIAGNOSIS — M1711 Unilateral primary osteoarthritis, right knee: Secondary | ICD-10-CM | POA: Diagnosis not present

## 2018-08-15 DIAGNOSIS — E039 Hypothyroidism, unspecified: Secondary | ICD-10-CM | POA: Insufficient documentation

## 2018-08-15 DIAGNOSIS — Z79899 Other long term (current) drug therapy: Secondary | ICD-10-CM | POA: Insufficient documentation

## 2018-08-15 DIAGNOSIS — M25561 Pain in right knee: Secondary | ICD-10-CM | POA: Diagnosis not present

## 2018-08-15 DIAGNOSIS — M25569 Pain in unspecified knee: Secondary | ICD-10-CM | POA: Diagnosis not present

## 2018-08-15 DIAGNOSIS — M4800 Spinal stenosis, site unspecified: Secondary | ICD-10-CM | POA: Diagnosis not present

## 2018-08-15 DIAGNOSIS — I10 Essential (primary) hypertension: Secondary | ICD-10-CM | POA: Insufficient documentation

## 2018-08-15 DIAGNOSIS — R5381 Other malaise: Secondary | ICD-10-CM | POA: Diagnosis not present

## 2018-08-15 DIAGNOSIS — Z9884 Bariatric surgery status: Secondary | ICD-10-CM | POA: Insufficient documentation

## 2018-08-15 LAB — CBC WITH DIFFERENTIAL/PLATELET
Abs Immature Granulocytes: 0.1 10*3/uL — ABNORMAL HIGH (ref 0.00–0.07)
Basophils Absolute: 0 10*3/uL (ref 0.0–0.1)
Basophils Relative: 0 %
Eosinophils Absolute: 0 10*3/uL (ref 0.0–0.5)
Eosinophils Relative: 0 %
HCT: 36.2 % (ref 36.0–46.0)
Hemoglobin: 11.4 g/dL — ABNORMAL LOW (ref 12.0–15.0)
Immature Granulocytes: 1 %
Lymphocytes Relative: 11 %
Lymphs Abs: 1.3 10*3/uL (ref 0.7–4.0)
MCH: 31.1 pg (ref 26.0–34.0)
MCHC: 31.5 g/dL (ref 30.0–36.0)
MCV: 98.6 fL (ref 80.0–100.0)
Monocytes Absolute: 0.4 10*3/uL (ref 0.1–1.0)
Monocytes Relative: 4 %
Neutro Abs: 9.4 10*3/uL — ABNORMAL HIGH (ref 1.7–7.7)
Neutrophils Relative %: 84 %
Platelets: 256 10*3/uL (ref 150–400)
RBC: 3.67 MIL/uL — ABNORMAL LOW (ref 3.87–5.11)
RDW: 13.4 % (ref 11.5–15.5)
WBC: 11.2 10*3/uL — ABNORMAL HIGH (ref 4.0–10.5)
nRBC: 0 % (ref 0.0–0.2)

## 2018-08-15 LAB — C-REACTIVE PROTEIN: CRP: <0.8 mg/dL (ref <1.0)

## 2018-08-15 LAB — SEDIMENTATION RATE: Sed Rate: 12 mm/hr (ref 0–22)

## 2018-08-15 MED ORDER — HYDROCODONE-ACETAMINOPHEN 5-325 MG PO TABS
1.0000 | ORAL_TABLET | Freq: Once | ORAL | Status: AC
  Administered 2018-08-15: 19:00:00 1 via ORAL
  Filled 2018-08-15: qty 1

## 2018-08-15 MED ORDER — HYDROCODONE-ACETAMINOPHEN 5-325 MG PO TABS: 1.0000 | ORAL_TABLET | ORAL | 0 refills | Status: DC | PRN

## 2018-08-15 NOTE — Discharge Instructions (Addendum)
Keep knee elevated and apply ice as often as possible.  Follow-up with Dr. Stann Mainland next week.  Return to the ER immediately if you develop any redness, severe swelling, or warmth of the joint, if your pain dramatically increases, or if you develop fever.

## 2018-08-15 NOTE — ED Provider Notes (Signed)
Patient signed out to me by Lindsey Bun, MD.  Please see previous notes for further history.  In brief, patient had a knee injection with Dr. Stann Mainland yesterday.  She started to develop severe pain last night which is making it hard for her to walk.  On exam, she has no sign of infections, exam is consistent with bad osteoarthritis.  She does have a walker at home.  Plan is for a face-to-face patient with PT/OT and social work consult.  Discussed with Johnathan Riffey, home health has been arranged by Mariann Laster for when patient is discharged.   Franchot Heidelberg, PA-C 08/15/18 2330    Varney Biles, MD 08/17/18 [phone]

## 2018-08-15 NOTE — ED Provider Notes (Addendum)
[facility]-EMERGENCY DEPT Provider Note   CSN: [id number] Arrival date & time: 08/15/18  1609    History   Chief Complaint Chief Complaint  Patient presents with   Knee Pain    HPI Lindsey Rowe is a 73 y.o. female.     73 year old female with past medical history including bipolar disorder, hypertension, hyperlipidemia, hypothyroidism who presents with right knee pain.  Earlier yesterday, she had a routine steroid injection in both knees by Dr. Aundria Rudogers, orthopedic surgeon.  She has had this many times previously and usually tolerates it well.  She went home feeling okay and around 6 PM she fell asleep on her couch.  When she awoke at 8 PM, she was having severe pain in her right knee that radiates down her lower leg.  She denies any fall or injury.  She has not been able to bear weight on her leg since then.  She has not noticed any redness or swelling.  No fevers or recent illness.  No previous surgery to her knee.  The history is provided by the patient.  Knee Pain   Past Medical History:  Diagnosis Date   Arthritis    Back pain    Bipolar 1 disorder (HCC)    Complication of anesthesia    hard to wake up anesthesia   Depression    GERD (gastroesophageal reflux disease)    Headache(784.0)    migraines and tension headaches   Hyperlipidemia    Hypertension    Hypothyroidism    Thyroid disease     Patient Active Problem List   Diagnosis Date Noted   Asthmatic bronchitis 12/02/2017   Acute respiratory failure with hypoxia (HCC) 06/27/2017   Diastolic dysfunction 06/27/2017   Acute lower UTI 06/27/2017   Acute metabolic encephalopathy 06/27/2017   Altered mental state 06/10/2017   Dyspnea and respiratory abnormalities 08/29/2016   Hypoxemia 08/29/2016   Shoulder dislocation, right, initial encounter 05/24/2016   Hill Sachs deformity, 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   Hyperglycemia 05/24/2016   Anterior dislocation of right shoulder     Syncope 03/24/2016   Generalized weakness 09/24/2015   Leg pain, bilateral 09/24/2015   Depression 09/24/2015   Loss of appetite 09/24/2015   Leg weakness, bilateral    Hypothyroid 12/28/2012   Hypovolemia 12/28/2012   Hypotension 08/13/2012   Dehydration 08/13/2012   AKI (acute kidney injury) (HCC) 08/13/2012   Hypoventilation associated with obesity syndrome (HCC) 08/13/2012   Acute respiratory failure (HCC) 07/26/2012   Chest pain 07/26/2012   ANKLE SPRAIN, RIGHT 03/16/2010   DIVERTICULOSIS, COLON 10/31/2009   SKIN LESION 09/23/2009   Shoulder pain, right 07/05/2009   COUGH 03/29/2009   Migraine 03/11/2009   OTITIS MEDIA, RIGHT 12/13/2008   BRUISE 12/06/2008   ACID REFLUX DISEASE 09/13/2008   ALLERGIC RHINITIS 05/20/2008   UNSPECIFIED DISEASE OF HAIR AND HAIR FOLLICLES 05/20/2008   DERMATITIS 11/12/2007   OBESITY 07/18/2007   HYPERCHOLESTEROLEMIA 05/13/2007   BIPOLAR AFFECTIVE DISORDER 05/12/2007   HYPERTENSION, BENIGN ESSENTIAL 05/12/2007   Low back pain potentially associated with radiculopathy 05/12/2007    Past Surgical History:  Procedure Laterality Date   APPENDECTOMY     BACK SURGERY     EYE SURGERY Bilateral    lens implant   GASTRIC BYPASS     LOOP RECORDER INSERTION N/A 05/25/2016   Procedure: Loop Recorder Insertion;  Surgeon: Will Jorja LoaMartin Camnitz, MD;  Location: [facility];  Service: Cardiovascular;  Laterality: N/A;   LUMBAR LAMINECTOMY/DECOMPRESSION MICRODISCECTOMY Right  07/23/2012   Procedure: LUMBAR LAMINECTOMY/DECOMPRESSION MICRODISCECTOMY 2 LEVELS;  Surgeon: Karn CassisErnesto M Botero, MD;  Location: [facility]O [facility];  Service: Neurosurgery;  Laterality: Right;  Right Lumbar three-four  lumbar four-five Laminectomy/Foraminotomy   NASAL SINUS SURGERY Bilateral    TONSILLECTOMY     WRIST SURGERY       OB History   No obstetric history on file.      Home Medications    Prior to Admission medications     Medication Sig Start Date End Date Taking? Authorizing Provider  albuterol (PROVENTIL HFA;VENTOLIN HFA) 108 (90 Base) MCG/ACT inhaler Inhale 2 puffs into the lungs every 6 (six) hours as needed for wheezing or shortness of breath. 04/07/18   Charlynne PanderYao, David Hsienta, MD  Aspirin-Salicylamide-Caffeine [facility](BC HEADACHE POWDER PO) Take 1 packet by mouth as needed (headache, pain).    [provider]  Carboxymethylcellulose Sodium (EYE DROPS OP) Apply 1 drop to eye daily as needed (dry eyes).    [provider]  Cholecalciferol (D3-1000) 1000 units capsule Take 1,000 Units by mouth daily.     [provider]  clonazePAM (KLONOPIN) 0.5 MG tablet Take 0.5 mg by mouth at bedtime as needed for anxiety.    [provider]  DULoxetine (CYMBALTA) 30 MG capsule Take 1 capsule (30 mg total) by mouth 2 (two) times daily. 05/26/16   Pearson GrippeKim, James, MD  DULoxetine (CYMBALTA) 60 MG capsule Take 60 mg by mouth every evening.    [provider]  fluticasone (FLONASE) 50 MCG/ACT nasal spray Place 1 spray into both nostrils daily as needed for allergies or rhinitis. 06/29/17   Rai, Delene Ruffiniipudeep K, MD  HYDROcodone-acetaminophen (NORCO/VICODIN) 5-325 MG tablet Take 1 tablet by mouth every 4 (four) hours as needed for severe pain. 08/15/18   Ewa Hipp, Ambrose Finlandachel Morgan, MD  levothyroxine (SYNTHROID, LEVOTHROID) 75 MCG tablet Take 75 mcg by mouth daily before breakfast.  09/06/15   [provider]  lisinopril (PRINIVIL,ZESTRIL) 10 MG tablet Take 10 mg by mouth daily.  08/15/16   [provider]  loperamide (IMODIUM) 2 MG capsule Take 1 capsule (2 mg total) by mouth 3 (three) times daily as needed for diarrhea or loose stools. 07/02/17   Rai, Ripudeep Kirtland BouchardK, MD  meloxicam (MOBIC) 15 MG tablet Take 15 mg by mouth daily as needed for pain.    [provider]  metoprolol succinate (TOPROL-XL) 50 MG 24 hr tablet Take 1 tablet (50 mg total) by mouth daily. Take with or immediately following a  meal. 05/26/18   Camnitz, Andree CossWill Martin, MD  omeprazole (PRILOSEC) 20 MG capsule Take 20 mg by mouth 2 (two) times daily before a meal.    [provider]  OVER THE COUNTER MEDICATION Place 1 application into both eyes 2 (two) times daily as needed (dry eyes). Eye Gel    [provider]  pravastatin (PRAVACHOL) 40 MG tablet Take 40 mg by mouth daily.    [provider]  pregabalin (LYRICA) 100 MG capsule Take 1 capsule (100 mg total) by mouth 2 (two) times daily. 09/27/15   Joseph ArtVann, Jessica U, DO  PROLIA 60 MG/ML SOSY injection  01/09/18   [provider]  RESTASIS 0.05 % ophthalmic emulsion Place 1 drop into both eyes 2 (two) times daily.  02/19/18   [provider]  vitamin B-12 (CYANOCOBALAMIN) 1000 MCG tablet Take 1,000 mcg by mouth daily.    [provider]  Vitamin D, Ergocalciferol, (DRISDOL) 1.25 MG (50000 UT) CAPS capsule Take 50,000 Units  by mouth once a week. 03/03/18   [provider]    Family History Family History  Problem Relation Age of Onset   Heart disease Mother    Emphysema Father    Diabetes Sister    Other Brother        Brain tumor - s/p excision    Social History Social History   Tobacco Use   Smoking status: Never Smoker   Smokeless tobacco: Never Used  Substance Use Topics   Alcohol use: Yes    Comment: social   Drug use: No    Comment: Pt + opioids as prescribed     Allergies   Gabapentin and Cefadroxil   Review of Systems Review of Systems   Physical Exam Updated Vital Signs BP 130/65    Pulse 82    Temp 98.5 F (36.9 C)    Resp 13    SpO2 98%   Physical Exam Vitals signs and nursing note reviewed.  Constitutional:      General: She is not in acute distress.    Appearance: She is well-developed.  HENT:     Head: Normocephalic and atraumatic.  Eyes:     Conjunctiva/sclera: Conjunctivae normal.  Neck:     Musculoskeletal: Neck supple.  Musculoskeletal: Normal range of  motion.     Comments: R leg externally rotated, full ROM knee without obvious joint effusion, small punctate ecchymosis from injection with no surrounding erythema or warmth  Skin:    General: Skin is warm and dry.  Neurological:     Mental Status: She is alert and oriented to person, place, and time.  Psychiatric:        Judgment: Judgment normal.      ED Treatments / Results  Labs (all labs ordered are listed, but only abnormal results are displayed) Labs Reviewed  CBC WITH DIFFERENTIAL/PLATELET - Abnormal; Notable for the following components:      Result Value   WBC 11.2 (*)    RBC 3.67 (*)    Hemoglobin 11.4 (*)    Neutro Abs 9.4 (*)    Abs Immature Granulocytes 0.10 (*)    All other components within normal limits  SEDIMENTATION RATE  C-REACTIVE PROTEIN    EKG None  Radiology Dg Tibia/fibula Right  Result Date: 08/15/2018 CLINICAL DATA:  Right knee and lower leg pain following 2 orthopedic injections yesterday. EXAM: RIGHT TIBIA AND FIBULA - 2 VIEW COMPARISON:  04/07/2018. Right knee radiographs obtained today. FINDINGS: Right knee degenerative changes, described separately. The remainder of the tibia and fibula are unremarkable as are the lower leg soft tissues. Moderate calcaneal spur formation. IMPRESSION: No acute abnormality. Right knee degenerative changes. Electronically Signed   By: Claudie Revering M.D.   On: 08/15/2018 17:57   Dg Knee Complete 4 Views Right  Result Date: 08/15/2018 CLINICAL DATA:  Anterior right knee pain radiating down the leg following 2 orthopedic injections yesterday. EXAM: RIGHT KNEE - COMPLETE 4+ VIEW COMPARISON:  Right tibia and fibula radiographs obtained at the same time and on 04/07/2018. FINDINGS: Marked posterior patellar spur formation and moderate-sized effusion. Moderate lateral joint space narrowing and associated spur formation. Mild medial joint space narrowing. No soft tissue gas, bone destruction or periosteal reaction.  IMPRESSION: Degenerative changes and moderate-sized effusion. No acute abnormality. Electronically Signed   By: Claudie Revering M.D.   On: 08/15/2018 17:55    Procedures Procedures (including critical care time)  Medications Ordered in ED Medications  HYDROcodone-acetaminophen (NORCO/VICODIN) 5-325 MG per  tablet 1 tablet (1 tablet Oral Given 08/15/18 1923)     Initial Impression / Assessment and Plan / ED Course  I have reviewed the triage vital signs and the nursing notes.  Pertinent labs & imaging results that were available during my care of the patient were reviewed by me and considered in my medical decision making (see chart for details).       No obvious signs of infection on exam.  She was able to demonstrate full range of motion on passive movements without problems which makes septic joint very unlikely.  WBC 11.2.  X-ray shows no acute abnormalities, she does have degenerative changes of knee, moderate sized effusion.  I discussed with her orthopedic surgeon, Dr. Aundria Rudogers, who felt that infection was extremely unlikely.  He recommended obtaining baseline inflammatory markers so that they can be followed over time if needed.  He can see the patient next week in the clinic.  I provided with pain medication for short-term pain control and patient was able to bear weight with assistance in the ED.  Return precautions reviewed.  9:02 PM Consulted case mgmt and social work for PT/OT/home health eval. Placed face to face order.  Final Clinical Impressions(s) / ED Diagnoses   Final diagnoses:  Acute pain of right knee    ED Discharge Orders         Ordered    HYDROcodone-acetaminophen (NORCO/VICODIN) 5-325 MG tablet  Every 4 hours PRN     08/15/18 2029           Lajuanda Penick, Ambrose Finlandachel Morgan, MD 08/15/18 2033    Rondrick Barreira, Ambrose Finlandachel Morgan, MD 08/15/18 2102

## 2018-08-15 NOTE — ED Triage Notes (Signed)
Per EMS, patient from home, c/o right knee pain worsening since two injections by ortho yesterday. 2L O2 [place] at baseline. Ambulatory with walker at baseline.

## 2018-08-16 DIAGNOSIS — R52 Pain, unspecified: Secondary | ICD-10-CM | POA: Diagnosis not present

## 2018-08-16 DIAGNOSIS — R0902 Hypoxemia: Secondary | ICD-10-CM | POA: Diagnosis not present

## 2018-08-16 DIAGNOSIS — Z743 Need for continuous supervision: Secondary | ICD-10-CM | POA: Diagnosis not present

## 2018-08-16 DIAGNOSIS — R279 Unspecified lack of coordination: Secondary | ICD-10-CM | POA: Diagnosis not present

## 2018-08-16 DIAGNOSIS — M4800 Spinal stenosis, site unspecified: Secondary | ICD-10-CM | POA: Diagnosis not present

## 2018-08-17 DIAGNOSIS — M4800 Spinal stenosis, site unspecified: Secondary | ICD-10-CM | POA: Diagnosis not present

## 2018-08-18 DIAGNOSIS — J449 Chronic obstructive pulmonary disease, unspecified: Secondary | ICD-10-CM | POA: Diagnosis not present

## 2018-08-18 DIAGNOSIS — M810 Age-related osteoporosis without current pathological fracture: Secondary | ICD-10-CM | POA: Diagnosis not present

## 2018-08-18 DIAGNOSIS — E039 Hypothyroidism, unspecified: Secondary | ICD-10-CM | POA: Diagnosis not present

## 2018-08-18 DIAGNOSIS — F319 Bipolar disorder, unspecified: Secondary | ICD-10-CM | POA: Diagnosis not present

## 2018-08-18 DIAGNOSIS — E041 Nontoxic single thyroid nodule: Secondary | ICD-10-CM | POA: Diagnosis not present

## 2018-08-18 DIAGNOSIS — M1990 Unspecified osteoarthritis, unspecified site: Secondary | ICD-10-CM | POA: Diagnosis not present

## 2018-08-18 DIAGNOSIS — M797 Fibromyalgia: Secondary | ICD-10-CM | POA: Diagnosis not present

## 2018-08-19 ENCOUNTER — Other Ambulatory Visit: Payer: Self-pay | Admitting: Family Medicine

## 2018-08-19 DIAGNOSIS — E041 Nontoxic single thyroid nodule: Secondary | ICD-10-CM

## 2018-08-21 ENCOUNTER — Other Ambulatory Visit: Payer: Self-pay | Admitting: Primary Care

## 2018-08-22 ENCOUNTER — Other Ambulatory Visit: Payer: Medicare HMO

## 2018-08-25 ENCOUNTER — Emergency Department (HOSPITAL_COMMUNITY)
Admission: EM | Admit: 2018-08-25 | Discharge: 2018-08-26 | Discharge status: Against Medical Advice | Payer: Medicare HMO | Attending: Emergency Medicine | Admitting: Emergency Medicine

## 2018-08-25 ENCOUNTER — Encounter (HOSPITAL_COMMUNITY): Payer: Self-pay | Admitting: Emergency Medicine

## 2018-08-25 ENCOUNTER — Other Ambulatory Visit: Payer: Self-pay

## 2018-08-25 DIAGNOSIS — Z5321 Procedure and treatment not carried out due to patient leaving prior to being seen by health care provider: Secondary | ICD-10-CM | POA: Diagnosis not present

## 2018-08-25 DIAGNOSIS — R112 Nausea with vomiting, unspecified: Secondary | ICD-10-CM | POA: Diagnosis not present

## 2018-08-25 DIAGNOSIS — R110 Nausea: Secondary | ICD-10-CM | POA: Diagnosis not present

## 2018-08-25 DIAGNOSIS — R1084 Generalized abdominal pain: Secondary | ICD-10-CM | POA: Diagnosis not present

## 2018-08-25 DIAGNOSIS — R109 Unspecified abdominal pain: Secondary | ICD-10-CM | POA: Diagnosis present

## 2018-08-25 DIAGNOSIS — I10 Essential (primary) hypertension: Secondary | ICD-10-CM | POA: Diagnosis not present

## 2018-08-25 DIAGNOSIS — R52 Pain, unspecified: Secondary | ICD-10-CM | POA: Diagnosis not present

## 2018-08-25 LAB — COMPREHENSIVE METABOLIC PANEL
ALT: 14 U/L (ref 0–44)
AST: 22 U/L (ref 15–41)
Albumin: 3.8 g/dL (ref 3.5–5.0)
Alkaline Phosphatase: 93 U/L (ref 38–126)
Anion gap: 12 (ref 5–15)
BUN: 20 mg/dL (ref 8–23)
CO2: 23 mmol/L (ref 22–32)
Calcium: 9.3 mg/dL (ref 8.9–10.3)
Chloride: 101 mmol/L (ref 98–111)
Creatinine, Ser: 1.06 mg/dL — ABNORMAL HIGH (ref 0.44–1.00)
GFR calc Af Amer: >60 mL/min (ref >60)
GFR calc non Af Amer: 53 mL/min — ABNORMAL LOW (ref >60)
Glucose, Bld: 127 mg/dL — ABNORMAL HIGH (ref 70–99)
Potassium: 4.3 mmol/L (ref 3.5–5.1)
Sodium: 136 mmol/L (ref 135–145)
Total Bilirubin: 0.4 mg/dL (ref 0.3–1.2)
Total Protein: 6.4 g/dL — ABNORMAL LOW (ref 6.5–8.1)

## 2018-08-25 LAB — LIPASE, BLOOD: Lipase: 22 U/L (ref 11–51)

## 2018-08-25 LAB — CBC
HCT: 42.4 % (ref 36.0–46.0)
Hemoglobin: 13.7 g/dL (ref 12.0–15.0)
MCH: 31.1 pg (ref 26.0–34.0)
MCHC: 32.3 g/dL (ref 30.0–36.0)
MCV: 96.1 fL (ref 80.0–100.0)
Platelets: 334 10*3/uL (ref 150–400)
RBC: 4.41 MIL/uL (ref 3.87–5.11)
RDW: 13.9 % (ref 11.5–15.5)
WBC: 15.6 10*3/uL — ABNORMAL HIGH (ref 4.0–10.5)
nRBC: 0 % (ref 0.0–0.2)

## 2018-08-25 MED ORDER — SODIUM CHLORIDE 0.9% FLUSH: 3.0000 mL | Freq: Once | INTRAVENOUS | Status: DC

## 2018-08-25 NOTE — ED Notes (Signed)
Pt leaving AMA. 

## 2018-08-25 NOTE — ED Triage Notes (Addendum)
Brought by ems from home for c/o abdominal discomfort and n/v since Thursday.  Also c/o "quivering on the inside".  Endorses taking zofran at [place].  Wears O2 at 2L at home.

## 2018-08-27 ENCOUNTER — Other Ambulatory Visit: Payer: Medicare HMO

## 2018-08-29 ENCOUNTER — Other Ambulatory Visit (HOSPITAL_COMMUNITY)
Admission: RE | Admit: 2018-08-29 | Discharge: 2018-08-29 | Disposition: A | Discharge status: Home / Self Care | Payer: Medicare HMO | Source: Ambulatory Visit | Attending: Primary Care | Admitting: Primary Care

## 2018-08-29 DIAGNOSIS — Z1159 Encounter for screening for other viral diseases: Secondary | ICD-10-CM | POA: Diagnosis not present

## 2018-08-30 LAB — SARS CORONAVIRUS 2 (TAT 6-24 HRS): SARS Coronavirus 2: NEGATIVE

## 2018-09-01 ENCOUNTER — Ambulatory Visit (INDEPENDENT_AMBULATORY_CARE_PROVIDER_SITE_OTHER): Payer: Medicare HMO | Admitting: *Deleted

## 2018-09-01 DIAGNOSIS — R55 Syncope and collapse: Secondary | ICD-10-CM

## 2018-09-02 LAB — CUP PACEART REMOTE DEVICE CHECK
Date Time Interrogation Session: 20200728004026
Implantable Pulse Generator Implant Date: 20180420

## 2018-09-03 ENCOUNTER — Ambulatory Visit: Payer: Self-pay | Admitting: Pulmonary Disease

## 2018-09-04 ENCOUNTER — Other Ambulatory Visit: Payer: Medicare HMO

## 2018-09-04 DIAGNOSIS — J9601 Acute respiratory failure with hypoxia: Secondary | ICD-10-CM | POA: Diagnosis not present

## 2018-09-04 DIAGNOSIS — J45909 Unspecified asthma, uncomplicated: Secondary | ICD-10-CM | POA: Diagnosis not present

## 2018-09-04 DIAGNOSIS — R0902 Hypoxemia: Secondary | ICD-10-CM | POA: Diagnosis not present

## 2018-09-09 ENCOUNTER — Telehealth: Payer: Self-pay

## 2018-09-09 NOTE — Telephone Encounter (Signed)
Left message for patient to advise of disconnected monitor 

## 2018-09-10 ENCOUNTER — Other Ambulatory Visit: Payer: Medicare HMO

## 2018-09-19 ENCOUNTER — Other Ambulatory Visit: Payer: Self-pay | Admitting: Pulmonary Disease

## 2018-09-19 NOTE — Progress Notes (Signed)
Carelink Summary Report / Loop Recorder 

## 2018-09-22 ENCOUNTER — Other Ambulatory Visit (HOSPITAL_COMMUNITY): Admission: RE | Admit: 2018-09-22 | Payer: Medicare HMO | Source: Ambulatory Visit

## 2018-09-22 ENCOUNTER — Telehealth: Payer: Self-pay | Admitting: Pulmonary Disease

## 2018-09-22 NOTE — Telephone Encounter (Signed)
Routing message to Sharl Ma so she can call to reschedule pt's PFT.

## 2018-09-23 NOTE — Telephone Encounter (Signed)
Called and left message on pt vm to call back to r/s pft -pr

## 2018-09-25 ENCOUNTER — Ambulatory Visit: Payer: Medicare HMO | Admitting: Pulmonary Disease

## 2018-09-25 DIAGNOSIS — W19XXXA Unspecified fall, initial encounter: Secondary | ICD-10-CM | POA: Diagnosis not present

## 2018-09-25 DIAGNOSIS — R4020 Unspecified coma: Secondary | ICD-10-CM | POA: Diagnosis not present

## 2018-09-25 DIAGNOSIS — R55 Syncope and collapse: Secondary | ICD-10-CM | POA: Diagnosis not present

## 2018-09-25 DIAGNOSIS — R58 Hemorrhage, not elsewhere classified: Secondary | ICD-10-CM | POA: Diagnosis not present

## 2018-09-26 ENCOUNTER — Telehealth: Payer: Self-pay

## 2018-09-26 NOTE — Telephone Encounter (Signed)
Unable to leave a message for patient regarding disconnected monitor 

## 2018-09-29 ENCOUNTER — Telehealth: Payer: Self-pay

## 2018-09-29 NOTE — Telephone Encounter (Signed)
I left a message for the pt to call my direct office number. I need her to send a manual transmission with her home monitor.

## 2018-09-30 NOTE — Telephone Encounter (Signed)
LMOVM

## 2018-10-01 ENCOUNTER — Other Ambulatory Visit: Payer: Medicare HMO

## 2018-10-01 NOTE — Telephone Encounter (Signed)
Called and r/s pft to 9/22-pr

## 2018-10-02 NOTE — Telephone Encounter (Signed)
I spoke with the pt and she agreed to send a transmission with her home monitor tonight.

## 2018-10-03 NOTE — Telephone Encounter (Signed)
Manual transmission reviewed--tachy episode ECG shows ST vs SVT on 09/11/18 at 00:25, duration 20min 20sec. Appears similar to previous episodes.

## 2018-10-03 NOTE — Telephone Encounter (Signed)
Transmission received 10-02-2018

## 2018-10-05 DIAGNOSIS — J9601 Acute respiratory failure with hypoxia: Secondary | ICD-10-CM | POA: Diagnosis not present

## 2018-10-05 DIAGNOSIS — R0902 Hypoxemia: Secondary | ICD-10-CM | POA: Diagnosis not present

## 2018-10-05 DIAGNOSIS — J45909 Unspecified asthma, uncomplicated: Secondary | ICD-10-CM | POA: Diagnosis not present

## 2018-10-06 ENCOUNTER — Ambulatory Visit (INDEPENDENT_AMBULATORY_CARE_PROVIDER_SITE_OTHER): Payer: Medicare HMO | Admitting: *Deleted

## 2018-10-06 ENCOUNTER — Other Ambulatory Visit: Payer: Medicare HMO

## 2018-10-06 DIAGNOSIS — R55 Syncope and collapse: Secondary | ICD-10-CM | POA: Diagnosis not present

## 2018-10-06 LAB — CUP PACEART REMOTE DEVICE CHECK
Date Time Interrogation Session: 20200830014156
Implantable Pulse Generator Implant Date: 20180420

## 2018-10-07 ENCOUNTER — Emergency Department (HOSPITAL_COMMUNITY): Payer: Medicare HMO

## 2018-10-07 ENCOUNTER — Encounter (HOSPITAL_COMMUNITY): Payer: Self-pay | Admitting: Internal Medicine

## 2018-10-07 ENCOUNTER — Other Ambulatory Visit: Payer: Self-pay

## 2018-10-07 ENCOUNTER — Inpatient Hospital Stay (HOSPITAL_COMMUNITY)
Admission: EM | Admit: 2018-10-07 | Discharge: 2018-10-09 | DRG: 190 | Disposition: A | Discharge status: Home Health | Payer: Medicare HMO | Attending: Internal Medicine | Admitting: Internal Medicine

## 2018-10-07 ENCOUNTER — Observation Stay (HOSPITAL_COMMUNITY): Payer: Medicare HMO

## 2018-10-07 DIAGNOSIS — I10 Essential (primary) hypertension: Secondary | ICD-10-CM | POA: Diagnosis not present

## 2018-10-07 DIAGNOSIS — J45909 Unspecified asthma, uncomplicated: Secondary | ICD-10-CM | POA: Diagnosis not present

## 2018-10-07 DIAGNOSIS — Z833 Family history of diabetes mellitus: Secondary | ICD-10-CM | POA: Diagnosis not present

## 2018-10-07 DIAGNOSIS — I712 Thoracic aortic aneurysm, without rupture: Secondary | ICD-10-CM | POA: Diagnosis present

## 2018-10-07 DIAGNOSIS — J45901 Unspecified asthma with (acute) exacerbation: Secondary | ICD-10-CM | POA: Diagnosis not present

## 2018-10-07 DIAGNOSIS — Z825 Family history of asthma and other chronic lower respiratory diseases: Secondary | ICD-10-CM | POA: Diagnosis not present

## 2018-10-07 DIAGNOSIS — Z9884 Bariatric surgery status: Secondary | ICD-10-CM | POA: Diagnosis not present

## 2018-10-07 DIAGNOSIS — Z20828 Contact with and (suspected) exposure to other viral communicable diseases: Secondary | ICD-10-CM | POA: Diagnosis present

## 2018-10-07 DIAGNOSIS — E785 Hyperlipidemia, unspecified: Secondary | ICD-10-CM | POA: Diagnosis present

## 2018-10-07 DIAGNOSIS — Z79899 Other long term (current) drug therapy: Secondary | ICD-10-CM

## 2018-10-07 DIAGNOSIS — M2550 Pain in unspecified joint: Secondary | ICD-10-CM | POA: Diagnosis not present

## 2018-10-07 DIAGNOSIS — Z7951 Long term (current) use of inhaled steroids: Secondary | ICD-10-CM

## 2018-10-07 DIAGNOSIS — R0602 Shortness of breath: Secondary | ICD-10-CM

## 2018-10-07 DIAGNOSIS — Z79890 Hormone replacement therapy (postmenopausal): Secondary | ICD-10-CM

## 2018-10-07 DIAGNOSIS — E039 Hypothyroidism, unspecified: Secondary | ICD-10-CM | POA: Diagnosis not present

## 2018-10-07 DIAGNOSIS — R296 Repeated falls: Secondary | ICD-10-CM | POA: Diagnosis present

## 2018-10-07 DIAGNOSIS — F329 Major depressive disorder, single episode, unspecified: Secondary | ICD-10-CM | POA: Diagnosis present

## 2018-10-07 DIAGNOSIS — Z8249 Family history of ischemic heart disease and other diseases of the circulatory system: Secondary | ICD-10-CM

## 2018-10-07 DIAGNOSIS — R069 Unspecified abnormalities of breathing: Secondary | ICD-10-CM | POA: Diagnosis not present

## 2018-10-07 DIAGNOSIS — Z791 Long term (current) use of non-steroidal anti-inflammatories (NSAID): Secondary | ICD-10-CM

## 2018-10-07 DIAGNOSIS — M1990 Unspecified osteoarthritis, unspecified site: Secondary | ICD-10-CM | POA: Diagnosis not present

## 2018-10-07 DIAGNOSIS — K219 Gastro-esophageal reflux disease without esophagitis: Secondary | ICD-10-CM | POA: Diagnosis present

## 2018-10-07 DIAGNOSIS — J9600 Acute respiratory failure, unspecified whether with hypoxia or hypercapnia: Secondary | ICD-10-CM | POA: Diagnosis not present

## 2018-10-07 DIAGNOSIS — Z888 Allergy status to other drugs, medicaments and biological substances status: Secondary | ICD-10-CM | POA: Diagnosis not present

## 2018-10-07 DIAGNOSIS — J9601 Acute respiratory failure with hypoxia: Secondary | ICD-10-CM | POA: Diagnosis present

## 2018-10-07 DIAGNOSIS — Z7401 Bed confinement status: Secondary | ICD-10-CM | POA: Diagnosis not present

## 2018-10-07 DIAGNOSIS — Z9981 Dependence on supplemental oxygen: Secondary | ICD-10-CM

## 2018-10-07 DIAGNOSIS — Z881 Allergy status to other antibiotic agents status: Secondary | ICD-10-CM

## 2018-10-07 DIAGNOSIS — I5189 Other ill-defined heart diseases: Secondary | ICD-10-CM

## 2018-10-07 DIAGNOSIS — J441 Chronic obstructive pulmonary disease with (acute) exacerbation: Secondary | ICD-10-CM | POA: Diagnosis not present

## 2018-10-07 DIAGNOSIS — R0902 Hypoxemia: Secondary | ICD-10-CM | POA: Diagnosis not present

## 2018-10-07 DIAGNOSIS — G43909 Migraine, unspecified, not intractable, without status migrainosus: Secondary | ICD-10-CM | POA: Diagnosis present

## 2018-10-07 DIAGNOSIS — R270 Ataxia, unspecified: Secondary | ICD-10-CM | POA: Diagnosis not present

## 2018-10-07 LAB — CBC
HCT: 41.5 % (ref 36.0–46.0)
Hemoglobin: 12.9 g/dL (ref 12.0–15.0)
MCH: 31.5 pg (ref 26.0–34.0)
MCHC: 31.1 g/dL (ref 30.0–36.0)
MCV: 101.2 fL — ABNORMAL HIGH (ref 80.0–100.0)
Platelets: 204 10*3/uL (ref 150–400)
RBC: 4.1 MIL/uL (ref 3.87–5.11)
RDW: 15 % (ref 11.5–15.5)
WBC: 5.8 10*3/uL (ref 4.0–10.5)
nRBC: 0 % (ref 0.0–0.2)

## 2018-10-07 LAB — BASIC METABOLIC PANEL
Anion gap: 10 (ref 5–15)
BUN: 15 mg/dL (ref 8–23)
CO2: 31 mmol/L (ref 22–32)
Calcium: 9.4 mg/dL (ref 8.9–10.3)
Chloride: 101 mmol/L (ref 98–111)
Creatinine, Ser: 0.94 mg/dL (ref 0.44–1.00)
GFR calc Af Amer: >60 mL/min (ref >60)
GFR calc non Af Amer: >60 mL/min (ref >60)
Glucose, Bld: 111 mg/dL — ABNORMAL HIGH (ref 70–99)
Potassium: 4.1 mmol/L (ref 3.5–5.1)
Sodium: 142 mmol/L (ref 135–145)

## 2018-10-07 LAB — BRAIN NATRIURETIC PEPTIDE: B Natriuretic Peptide: 45 pg/mL (ref 0.0–100.0)

## 2018-10-07 LAB — SARS CORONAVIRUS 2 BY RT PCR (HOSPITAL ORDER, PERFORMED IN ~~LOC~~ HOSPITAL LAB): SARS Coronavirus 2: NEGATIVE

## 2018-10-07 MED ORDER — ACETAMINOPHEN 500 MG PO TABS
1000.0000 mg | ORAL_TABLET | Freq: Once | ORAL | Status: AC
  Administered 2018-10-07: 1000 mg via ORAL
  Filled 2018-10-07: qty 2

## 2018-10-07 MED ORDER — PREDNISONE 20 MG PO TABS
60.0000 mg | ORAL_TABLET | Freq: Once | ORAL | Status: AC
  Administered 2018-10-07: 60 mg via ORAL
  Filled 2018-10-07: qty 3

## 2018-10-07 MED ORDER — IPRATROPIUM-ALBUTEROL 0.5-2.5 (3) MG/3ML IN SOLN
3.0000 mL | Freq: Once | RESPIRATORY_TRACT | Status: AC
  Administered 2018-10-07: 3 mL via RESPIRATORY_TRACT
  Filled 2018-10-07: qty 3

## 2018-10-07 MED ORDER — ALBUTEROL SULFATE HFA 108 (90 BASE) MCG/ACT IN AERS
2.0000 | INHALATION_SPRAY | Freq: Once | RESPIRATORY_TRACT | Status: AC
  Administered 2018-10-07: 2 via RESPIRATORY_TRACT
  Filled 2018-10-07: qty 6.7

## 2018-10-07 NOTE — H&P (Signed)
History and Physical    Lindsey Rowe ZOX:[id number] DOB: October 25, 1945 DOA: 10/07/2018  PCP: Maurice SmallGriffin, Elaine, MD  Patient coming from: Home.  Chief Complaint: Shortness of breath.  HPI: Lindsey DupesDeborah C Shen is a 73 y.o. female with history of hypertension, hyperlipidemia, hypothyroidism, diastolic dysfunction per 2D echo done in May 2019 and history of thoracic aortic aneurysm has been experiencing shortness of breath with wheezing and cough for the last 3 days.  Over the last 24 hours it worsened and patient called EMS.  Denies any chest pain fever or chills.  Denies any sick contacts.  On arrival EMS had to place patient on nonrebreather.  Was found to be wheezing.  ED Course: In the ER patient was given nebulizer treatment IV steroids chest x-ray unremarkable.  Following the nebulizer treatment wheezing improved.  But on minimal ambulation patient becomes very tachycardic and tachypneic.  Has to be admitted for further observation.  Labs revealed BNP 45 hemoglobin 12.9 creatinine 0.9.  COVID-19 test was negative.  Review of Systems: As per HPI, rest all negative.   Past Medical History:  Diagnosis Date  . Arthritis   . Back pain   . Bipolar 1 disorder (HCC)   . Complication of anesthesia    hard to wake up anesthesia  . Depression   . GERD (gastroesophageal reflux disease)   . Headache(784.0)    migraines and tension headaches  . Hyperlipidemia   . Hypertension   . Hypothyroidism   . Thyroid disease     Past Surgical History:  Procedure Laterality Date  . APPENDECTOMY    . BACK SURGERY    . EYE SURGERY Bilateral    lens implant  . GASTRIC BYPASS    . LOOP RECORDER INSERTION N/A 05/25/2016   Procedure: Loop Recorder Insertion;  Surgeon: Will Jorja LoaMartin Camnitz, MD;  Location: [facility];  Service: Cardiovascular;  Laterality: N/A;  . LUMBAR LAMINECTOMY/DECOMPRESSION MICRODISCECTOMY Right 07/23/2012   Procedure: LUMBAR LAMINECTOMY/DECOMPRESSION MICRODISCECTOMY 2 LEVELS;   Surgeon: Karn CassisErnesto M Botero, MD;  Location: [facility];  Service: Neurosurgery;  Laterality: Right;  Right Lumbar three-four  lumbar four-five Laminectomy/Foraminotomy  . NASAL SINUS SURGERY Bilateral   . TONSILLECTOMY    . WRIST SURGERY       reports that she has never smoked. She has never used smokeless tobacco. She reports current alcohol use. She reports that she does not use drugs.  Allergies  Allergen Reactions  . Gabapentin Other (See Comments)    Right foot and leg swelled   . Cefadroxil Itching    Ends of hair itched     Family History  Problem Relation Age of Onset  . Heart disease Mother   . Emphysema Father   . Diabetes Sister   . Other Brother        Brain tumor - s/p excision    Prior to Admission medications   Medication Sig Start Date End Date Taking? Authorizing Provider  albuterol (PROVENTIL HFA;VENTOLIN HFA) 108 (90 Base) MCG/ACT inhaler Inhale 2 puffs into the lungs every 6 (six) hours as needed for wheezing or shortness of breath. 04/07/18  Yes Charlynne PanderYao, David Hsienta, MD  Aspirin-Salicylamide-Caffeine [facility](BC HEADACHE POWDER PO) Take 1 packet by mouth as needed (headache, pain).   Yes [provider]  Carboxymethylcellulose Sodium (EYE DROPS OP) Apply 1 drop to eye daily as needed (dry eyes).   Yes [provider]  DULoxetine (CYMBALTA) 30 MG capsule Take 1 capsule (30 mg total) by mouth  2 (two) times daily. Patient taking differently: Take 30-60 mg by mouth See admin instructions. 30 MG in the am and 60 MG at night 05/26/16  Yes Pearson GrippeKim, James, MD  fluticasone [facility](FLONASE) 50 MCG/ACT nasal spray Place 1 spray into both nostrils daily as needed for allergies or rhinitis. 06/29/17  Yes Rai, Ripudeep K, MD  HYDROcodone-acetaminophen (NORCO/VICODIN) 5-325 MG tablet Take 1 tablet by mouth every 4 (four) hours as needed for severe pain. 08/15/18  Yes Little, Ambrose Finlandachel Morgan, MD  levothyroxine (SYNTHROID, LEVOTHROID) 75 MCG tablet Take 75 mcg by mouth daily before  breakfast.  09/06/15  Yes [provider]  lisinopril (PRINIVIL,ZESTRIL) 10 MG tablet Take 10 mg by mouth daily.  08/15/16  Yes [provider]  loperamide (IMODIUM) 2 MG capsule Take 1 capsule (2 mg total) by mouth 3 (three) times daily as needed for diarrhea or loose stools. 07/02/17  Yes Rai, Ripudeep K, MD  meloxicam (MOBIC) 15 MG tablet Take 15 mg by mouth daily as needed for pain.   Yes [provider]  metoprolol succinate (TOPROL-XL) 50 MG 24 hr tablet Take 1 tablet (50 mg total) by mouth daily. Take with or immediately following a meal. 05/26/18  Yes Camnitz, Will Daphine DeutscherMartin, MD  omeprazole (PRILOSEC) 20 MG capsule Take 20 mg by mouth 2 (two) times daily before a meal.   Yes [provider]  OVER THE COUNTER MEDICATION Place 1 application into both eyes 2 (two) times daily as needed (dry eyes). Eye Gel   Yes [provider]  pravastatin (PRAVACHOL) 40 MG tablet Take 40 mg by mouth daily.   Yes [provider]  pregabalin (LYRICA) 100 MG capsule Take 1 capsule (100 mg total) by mouth 2 (two) times daily. 09/27/15  Yes Vann, Jessica U, DO  PROLIA 60 MG/ML SOSY injection Inject 60 mg into the skin every 6 (six) months.  01/09/18  Yes [provider]  RESTASIS 0.05 % ophthalmic emulsion Place 1 drop into both eyes 2 (two) times daily.  02/19/18  Yes [provider]  vitamin B-12 (CYANOCOBALAMIN) 1000 MCG tablet Take 1,000 mcg by mouth daily.   Yes [provider]  Vitamin D, Ergocalciferol, (DRISDOL) 1.25 MG (50000 UT) CAPS capsule Take 50,000 Units by mouth once a week. 03/03/18  Yes [provider]    Physical Exam: Constitutional: Moderately built and nourished. Vitals:   10/07/18 2100 10/07/18 2200 10/07/18 2215 10/07/18 2231  BP: (!) 150/78 (!) 155/101  (!) 152/93  Pulse: (!) 104 98 (!) 103 93  Resp: 20 16  15   Temp:      SpO2: 99% 100% 100% 99%  Weight:      Height:       Eyes: Anicteric no pallor.  ENMT: No discharge from the ears eyes nose or mouth. Neck: No mass felt.  No neck rigidity. Respiratory: No rhonchi or crepitations. Cardiovascular: S1-S2 heard. Abdomen: Soft nontender bowel sounds present. Musculoskeletal: No edema.  No joint effusion. Skin: No rash. Neurologic: Alert awake oriented to time place and person.  Moves all extremities. Psychiatric: Appears normal per normal affect.   Labs on Admission: I have personally reviewed following labs and imaging studies  CBC: Recent Labs  Lab 10/07/18 1710  WBC 5.8  HGB 12.9  HCT 41.5  MCV 101.2*  PLT 204   Basic Metabolic Panel: Recent Labs  Lab 10/07/18 1710  NA 142  K 4.1  CL 101  CO2 31  GLUCOSE 111*  BUN 15  CREATININE 0.94  CALCIUM 9.4   GFR: Estimated Creatinine Clearance: 66.6 mL/min (by C-G formula based on SCr of 0.94 mg/dL). Liver Function Tests: No results for input(s): AST, ALT, ALKPHOS, BILITOT, PROT, ALBUMIN in the last 168 hours. No results for input(s): LIPASE, AMYLASE in the last 168 hours. No results for input(s): AMMONIA in the last 168 hours. Coagulation Profile: No results for input(s): INR, PROTIME in the last 168 hours. Cardiac Enzymes: No results for input(s): CKTOTAL, CKMB, CKMBINDEX, TROPONINI in the last 168 hours. BNP (last 3 results) No results for input(s): PROBNP in the last 8760 hours. HbA1C: No results for input(s): HGBA1C in the last 72 hours. CBG: No results for input(s): GLUCAP in the last 168 hours. Lipid Profile: No results for input(s): CHOL, HDL, LDLCALC, TRIG, CHOLHDL, LDLDIRECT in the last 72 hours. Thyroid Function Tests: No results for input(s): TSH, T4TOTAL, FREET4, T3FREE, THYROIDAB in the last 72 hours. Anemia Panel: No results for input(s): VITAMINB12, FOLATE, FERRITIN, TIBC, IRON, RETICCTPCT in the last 72 hours. Urine analysis:    Component Value Date/Time   COLORURINE YELLOW 06/27/2017 0613   APPEARANCEUR HAZY (A) 06/27/2017 0613   LABSPEC  1.019 06/27/2017 0613   PHURINE 8.0 06/27/2017 0613   GLUCOSEU NEGATIVE 06/27/2017 0613   HGBUR SMALL (A) 06/27/2017 0613   HGBUR trace-lysed 09/23/2009 0000   BILIRUBINUR NEGATIVE 06/27/2017 0613   KETONESUR NEGATIVE 06/27/2017 0613   PROTEINUR NEGATIVE 06/27/2017 0613   UROBILINOGEN 1.0 08/13/2012 1757   NITRITE NEGATIVE 06/27/2017 0613   LEUKOCYTESUR MODERATE (A) 06/27/2017 0613   Sepsis Labs: @LABRCNTIP (procalcitonin:4,lacticidven:4) ) Recent Results (from the past 240 hour(s))  SARS Coronavirus 2 [facility] order, Performed in [facility] hospital lab) Nasopharyngeal Nasopharyngeal Swab     Status: None   Collection Time: 10/07/18  6:05 PM   Specimen: Nasopharyngeal Swab  Result Value Ref Range Status   SARS Coronavirus 2 NEGATIVE NEGATIVE Final    Comment: (NOTE) If result is NEGATIVE SARS-CoV-2 target nucleic acids are NOT DETECTED. The SARS-CoV-2 RNA is generally detectable in upper and lower  respiratory specimens during the acute phase of infection. The lowest  concentration of SARS-CoV-2 viral copies this assay can detect is 250  copies / mL. A negative result does not preclude SARS-CoV-2 infection  and should not be used as the sole basis for treatment or other  patient management decisions.  A negative result may occur with  improper specimen collection / handling, submission of specimen other  than nasopharyngeal swab, presence of viral mutation(s) within the  areas targeted by this assay, and inadequate number of viral copies  (<250 copies / mL). A negative result must be combined with clinical  observations, patient history, and epidemiological information. If result is POSITIVE SARS-CoV-2 target nucleic acids are DETECTED. The SARS-CoV-2 RNA is generally detectable in upper and lower  respiratory specimens dur ing the acute phase of infection.  Positive  results are indicative of active infection with SARS-CoV-2.  Clinical  correlation with patient history and  other diagnostic information is  necessary to determine patient infection status.  Positive results do  not rule out bacterial infection or co-infection with other viruses. If result is PRESUMPTIVE POSTIVE SARS-CoV-2 nucleic acids MAY BE PRESENT.   A presumptive positive result was obtained on the submitted specimen  and confirmed on repeat testing.  While 2019 novel coronavirus  (SARS-CoV-2) nucleic acids may be present in the submitted sample  additional confirmatory testing may be necessary for epidemiological  and / or clinical  management purposes  to differentiate between  SARS-CoV-2 and other Sarbecovirus currently known to infect humans.  If clinically indicated additional testing with an alternate test  methodology [id number]) is advised. The SARS-CoV-2 RNA is generally  detectable in upper and lower respiratory sp ecimens during the acute  phase of infection. The expected result is Negative. Fact Sheet for Patients:  StrictlyIdeas.no Fact Sheet for Healthcare Providers: BankingDealers.co.za This test is not yet approved or cleared by the [place] FDA and has been authorized for detection and/or diagnosis of SARS-CoV-2 by FDA under an Emergency Use Authorization (EUA).  This EUA will remain in effect (meaning this test can be used) for the duration of the COVID-19 declaration under Section 564(b)(1) of the Act, 21 U.S.C. section 360bbb-3(b)(1), unless the authorization is terminated or revoked sooner. Performed at [facility], [place] [address] [place], [place] [address]      Radiological Exams on Admission: Ct Head Wo Contrast  Result Date: 10/07/2018 CLINICAL DATA:  Ataxia. EXAM: CT HEAD WITHOUT CONTRAST TECHNIQUE: Contiguous axial images were obtained from the base of the skull through the vertex without intravenous contrast. COMPARISON:  CT head dated Jun 09, 2017. FINDINGS: Brain: No evidence of acute  infarction, hemorrhage, hydrocephalus, extra-axial collection or mass lesion/mass effect. Age related atrophy is noted. Chronic microvascular ischemic changes are noted. Vascular: No hyperdense vessel or unexpected calcification. Skull: Normal. Negative for fracture or focal lesion. Sinuses/Orbits: No acute finding. The patient is status post bilateral cataract surgery. Other: None. IMPRESSION: No acute intracranial abnormality. Electronically Signed   By: Constance Holster M.D.   On: 10/07/2018 23:14   Dg Chest Port 1 View  Result Date: 10/07/2018 CLINICAL DATA:  Shortness of breath. EXAM: PORTABLE CHEST 1 VIEW COMPARISON:  April 07, 2018 FINDINGS: There is a new density in the right upper lobe favored to represent a healing posterior fracture of the third rib. The heart size is stable from prior study. There is no pneumothorax. No large pleural effusion. There are degenerative changes of both shoulders, right worse than left. There is elevation of the right hemidiaphragm which is similar to prior study. IMPRESSION: No active disease. Electronically Signed   By: Constance Holster M.D.   On: 10/07/2018 16:53     Assessment/Plan Principal Problem:   Acute respiratory failure with hypoxia (HCC) Active Problems:   HYPERTENSION, BENIGN ESSENTIAL   Hypothyroid   Diastolic dysfunction   Asthmatic bronchitis    1. Acute respiratory failure with hypoxia suspect likely from asthmatic bronchitis.  Patient does not smoke cigarettes.  Will continue on nebulizer IV steroids.  Since patient becomes very tachypneic and tachycardic on minimal exertion will check d-dimer.  No obvious signs of any fluid overload. 2. History of diastolic dysfunction per 2D echo done in May 2019 which showed EF of 60 to 65%.  Presently does not look any signs of fluid overload. 3. Hypertension on lisinopril and metoprolol. 4. Hypothyroidism on Synthroid. 5. History of thoracic ascending aortic aneurysm being followed by Dr.  Mohammed Kindle cardiothoracic surgeon and per note from cardiothoracic surgery in March 2019 plan is to follow-up in 2 years.  Denies any chest pain at this time. 6. Hyperlipidemia on statins.   DVT prophylaxis: Lovenox. Code Status: Full code. Family Communication: Discussed with patient. Disposition Plan: Home. Consults called: None. Admission status: Observation.   Rise Patience MD [facility] Pager [phone].  If 7PM-7AM, please contact night-coverage [email or web address] Password [facility]  10/07/2018, 11:54 PM

## 2018-10-07 NOTE — ED Notes (Signed)
Pt ambulated down hallway and back with use of walker and 2L O2. Pt oxygen saturation maintained 96-97%. Pt states she felt dizzy and short of breath throughout the walk.  Heart rate maintained at 140-150. MD made aware and patient assisted back into bed.

## 2018-10-07 NOTE — ED Provider Notes (Signed)
[facility] Emergency Department Provider Note MRN:  [id number]  Arrival date & time: 10/07/18     Chief Complaint   Shortness of Breath   History of Present Illness   Lindsey Rowe is a 73 y.o. year-old female with a history of hypertension, COPD presenting to the ED with chief complaint of shortness of breath.  Gradual onset shortness of breath that started 3 days ago, progressively worsening.  Her breathing was quite poor last night which made her fear for her safety at home.  Much worse this morning.  Was requiring nonrebreather with EMS.  She endorses soreness in the chest only with coughing.  She denies dizziness or diaphoresis, no nausea or vomiting, no diarrhea, no abdominal pain, no numbness or weakness to the arms or legs.  Mild dry cough recently.  Review of Systems  A complete 10 system review of systems was obtained and all systems are negative except as noted in the HPI and PMH.   Patient's Health History    Past Medical History:  Diagnosis Date  . Arthritis   . Back pain   . Bipolar 1 disorder ([place])   . Complication of anesthesia    hard to wake up anesthesia  . Depression   . GERD (gastroesophageal reflux disease)   . Headache(784.0)    migraines and tension headaches  . Hyperlipidemia   . Hypertension   . Hypothyroidism   . Thyroid disease     Past Surgical History:  Procedure Laterality Date  . APPENDECTOMY    . BACK SURGERY    . EYE SURGERY Bilateral    lens implant  . GASTRIC BYPASS    . LOOP RECORDER INSERTION N/A 05/25/2016   Procedure: Loop Recorder Insertion;  Surgeon: Will Meredith Leeds, MD;  Location: [facility] CV [facility];  Service: Cardiovascular;  Laterality: N/A;  . LUMBAR LAMINECTOMY/DECOMPRESSION MICRODISCECTOMY Right 07/23/2012   Procedure: LUMBAR LAMINECTOMY/DECOMPRESSION MICRODISCECTOMY 2 LEVELS;  Surgeon: Floyce Stakes, MD;  Location: [facility];  Service: Neurosurgery;  Laterality: Right;  Right Lumbar  three-four  lumbar four-five Laminectomy/Foraminotomy  . NASAL SINUS SURGERY Bilateral   . TONSILLECTOMY    . WRIST SURGERY      Family History  Problem Relation Age of Onset  . Heart disease Mother   . Emphysema Father   . Diabetes Sister   . Other Brother        Brain tumor - s/p excision    Social History   Socioeconomic History  . Marital status: Widowed    Spouse name: Not on file  . Number of children: Not on file  . Years of education: Not on file  . Highest education level: Not on file  Occupational History  . Occupation: unemployed  Social Needs  . Financial resource strain: Not on file  . Food insecurity    Worry: Not on file    Inability: Not on file  . Transportation needs    Medical: Not on file    Non-medical: Not on file  Tobacco Use  . Smoking status: Never Smoker  . Smokeless tobacco: Never Used  Substance and Sexual Activity  . Alcohol use: Yes    Comment: social  . Drug use: No    Comment: Pt + opioids as prescribed  . Sexual activity: Not Currently  Lifestyle  . Physical activity    Days per week: Not on file    Minutes per session: Not on file  . Stress: Not on file  Relationships  . Social Musician on phone: Not on file    Gets together: Not on file    Attends religious service: Not on file    Active member of club or organization: Not on file    Attends meetings of clubs or organizations: Not on file    Relationship status: Not on file  . Intimate partner violence    Fear of current or ex partner: Not on file    Emotionally abused: Not on file    Physically abused: Not on file    Forced sexual activity: Not on file  Other Topics Concern  . Not on file  Social History Narrative   Pt lives alone in apartment in [place].     Physical Exam  Vital Signs and Nursing Notes reviewed Vitals:   10/07/18 1557 10/07/18 1600  BP: (!) 160/97 (!) 144/93  Pulse: 92 89  Resp: (!) 22 18  Temp: 97.6 F (36.4 C)   SpO2: 100%      CONSTITUTIONAL: Well-appearing, NAD NEURO:  Alert and oriented x 3, no focal deficits EYES:  eyes equal and reactive ENT/NECK:  no LAD, no JVD CARDIO: Regular rate, well-perfused, normal S1 and S2 PULM:  CTAB no wheezing or rhonchi GI/GU:  normal bowel sounds, non-distended, non-tender MSK/SPINE:  No gross deformities, no edema SKIN:  no rash, atraumatic PSYCH:  Appropriate speech and behavior  Diagnostic and Interventional Summary    EKG Interpretation  Date/Time:  Tuesday October 07 2018 15:59:03 EDT Ventricular Rate:  83 PR Interval:    QRS Duration: 100 QT Interval:  359 QTC Calculation: 422 R Axis:   42 Text Interpretation:  Sinus rhythm Low voltage, extremity and precordial leads Confirmed by Kennis Carina [phone]) on 10/07/2018 4:00:50 PM      Labs Reviewed  SARS CORONAVIRUS 2 (HOSPITAL ORDER, PERFORMED IN [facility])  CBC  BASIC METABOLIC PANEL  BRAIN NATRIURETIC PEPTIDE    DG Chest Port 1 View    (Results Pending)    Medications - No data to display   Procedures Critical Care  ED Course and Medical Decision Making  I have reviewed the triage vital signs and the nursing notes.  Pertinent labs & imaging results that were available during my care of the patient were reviewed by me and considered in my medical decision making (see below for details).  Cough and worsening hypoxia in this 73 year old female with history of COPD, uses 2 L oxygen at home.  Suspect COPD exacerbation, less likely CHF, and she has no evidence of DVT and is not tachycardic, little to no concern for PE.  ACS thought to be very unlikely given that her chest discomfort only occurs with coughing.  EKG is without ischemic changes.  Patient is on her baseline 2 L nasal cannula, sitting comfortably.  Normal vital signs.  However during ambulation with pulse ox, she becomes very symptomatic and tachycardic.  Did not become hypoxic.  Admitted to hospitalist service for further care.   Elmer Sow. Pilar Plate, MD [facility] Emergency Medicine [facility] [email or web address]  Final Clinical Impressions(s) / ED Diagnoses     ICD-10-CM   1. SOB (shortness of breath)  R06.02 DG Chest [facility] 1 View    DG Chest Port 1 View    ED Discharge Orders    None      Discharge Instructions Discussed with and Provided to Patient: Discharge Instructions   None  Sabas SousBero, Sharah Finnell M, MD 10/07/18 2128

## 2018-10-07 NOTE — ED Triage Notes (Signed)
EMS reports from home, SOB since Sunday, Hx of COPD. Sp02 92 on scene nasal cannula @ 2 lts/.  BP 151/90 HR 98 RR 20 Sp02 100 on Non rebreather !@ 10ltrs  Temp 97.5

## 2018-10-08 ENCOUNTER — Inpatient Hospital Stay (HOSPITAL_COMMUNITY): Payer: Medicare HMO

## 2018-10-08 DIAGNOSIS — Z20828 Contact with and (suspected) exposure to other viral communicable diseases: Secondary | ICD-10-CM | POA: Diagnosis present

## 2018-10-08 DIAGNOSIS — Z7951 Long term (current) use of inhaled steroids: Secondary | ICD-10-CM | POA: Diagnosis not present

## 2018-10-08 DIAGNOSIS — Z9884 Bariatric surgery status: Secondary | ICD-10-CM | POA: Diagnosis not present

## 2018-10-08 DIAGNOSIS — Z888 Allergy status to other drugs, medicaments and biological substances status: Secondary | ICD-10-CM | POA: Diagnosis not present

## 2018-10-08 DIAGNOSIS — J441 Chronic obstructive pulmonary disease with (acute) exacerbation: Secondary | ICD-10-CM | POA: Diagnosis present

## 2018-10-08 DIAGNOSIS — Z8249 Family history of ischemic heart disease and other diseases of the circulatory system: Secondary | ICD-10-CM | POA: Diagnosis not present

## 2018-10-08 DIAGNOSIS — Z833 Family history of diabetes mellitus: Secondary | ICD-10-CM | POA: Diagnosis not present

## 2018-10-08 DIAGNOSIS — I10 Essential (primary) hypertension: Secondary | ICD-10-CM | POA: Diagnosis present

## 2018-10-08 DIAGNOSIS — E785 Hyperlipidemia, unspecified: Secondary | ICD-10-CM | POA: Diagnosis present

## 2018-10-08 DIAGNOSIS — Z9981 Dependence on supplemental oxygen: Secondary | ICD-10-CM | POA: Diagnosis not present

## 2018-10-08 DIAGNOSIS — Z881 Allergy status to other antibiotic agents status: Secondary | ICD-10-CM | POA: Diagnosis not present

## 2018-10-08 DIAGNOSIS — J45901 Unspecified asthma with (acute) exacerbation: Secondary | ICD-10-CM | POA: Diagnosis present

## 2018-10-08 DIAGNOSIS — Z79899 Other long term (current) drug therapy: Secondary | ICD-10-CM | POA: Diagnosis not present

## 2018-10-08 DIAGNOSIS — F329 Major depressive disorder, single episode, unspecified: Secondary | ICD-10-CM | POA: Diagnosis present

## 2018-10-08 DIAGNOSIS — J9601 Acute respiratory failure with hypoxia: Secondary | ICD-10-CM | POA: Diagnosis present

## 2018-10-08 DIAGNOSIS — R296 Repeated falls: Secondary | ICD-10-CM | POA: Diagnosis present

## 2018-10-08 DIAGNOSIS — K219 Gastro-esophageal reflux disease without esophagitis: Secondary | ICD-10-CM | POA: Diagnosis present

## 2018-10-08 DIAGNOSIS — M1990 Unspecified osteoarthritis, unspecified site: Secondary | ICD-10-CM | POA: Diagnosis present

## 2018-10-08 DIAGNOSIS — E039 Hypothyroidism, unspecified: Secondary | ICD-10-CM | POA: Diagnosis present

## 2018-10-08 DIAGNOSIS — Z825 Family history of asthma and other chronic lower respiratory diseases: Secondary | ICD-10-CM | POA: Diagnosis not present

## 2018-10-08 DIAGNOSIS — I712 Thoracic aortic aneurysm, without rupture: Secondary | ICD-10-CM | POA: Diagnosis present

## 2018-10-08 DIAGNOSIS — Z791 Long term (current) use of non-steroidal anti-inflammatories (NSAID): Secondary | ICD-10-CM | POA: Diagnosis not present

## 2018-10-08 DIAGNOSIS — Z79890 Hormone replacement therapy (postmenopausal): Secondary | ICD-10-CM | POA: Diagnosis not present

## 2018-10-08 DIAGNOSIS — R0602 Shortness of breath: Secondary | ICD-10-CM | POA: Diagnosis present

## 2018-10-08 DIAGNOSIS — G43909 Migraine, unspecified, not intractable, without status migrainosus: Secondary | ICD-10-CM | POA: Diagnosis present

## 2018-10-08 LAB — CBC
HCT: 40.7 % (ref 36.0–46.0)
Hemoglobin: 12.7 g/dL (ref 12.0–15.0)
MCH: 31 pg (ref 26.0–34.0)
MCHC: 31.2 g/dL (ref 30.0–36.0)
MCV: 99.3 fL (ref 80.0–100.0)
Platelets: 216 10*3/uL (ref 150–400)
RBC: 4.1 MIL/uL (ref 3.87–5.11)
RDW: 14.7 % (ref 11.5–15.5)
WBC: 5.1 10*3/uL (ref 4.0–10.5)
nRBC: 0 % (ref 0.0–0.2)

## 2018-10-08 LAB — BASIC METABOLIC PANEL
Anion gap: 9 (ref 5–15)
BUN: 21 mg/dL (ref 8–23)
CO2: 29 mmol/L (ref 22–32)
Calcium: 9.3 mg/dL (ref 8.9–10.3)
Chloride: 100 mmol/L (ref 98–111)
Creatinine, Ser: 1.06 mg/dL — ABNORMAL HIGH (ref 0.44–1.00)
GFR calc Af Amer: >60 mL/min (ref >60)
GFR calc non Af Amer: 53 mL/min — ABNORMAL LOW (ref >60)
Glucose, Bld: 223 mg/dL — ABNORMAL HIGH (ref 70–99)
Potassium: 4.1 mmol/L (ref 3.5–5.1)
Sodium: 138 mmol/L (ref 135–145)

## 2018-10-08 LAB — BRAIN NATRIURETIC PEPTIDE: B Natriuretic Peptide: 57.3 pg/mL (ref 0.0–100.0)

## 2018-10-08 LAB — D-DIMER, QUANTITATIVE: D-Dimer, Quant: 0.92 ug/mL-FEU — ABNORMAL HIGH (ref 0.00–0.50)

## 2018-10-08 MED ORDER — LISINOPRIL 10 MG PO TABS
10.0000 mg | ORAL_TABLET | Freq: Every day | ORAL | Status: DC
  Administered 2018-10-08 – 2018-10-09 (×3): 10 mg via ORAL
  Filled 2018-10-08 (×3): qty 1

## 2018-10-08 MED ORDER — ACETAMINOPHEN 325 MG PO TABS: 650.0000 mg | ORAL_TABLET | Freq: Four times a day (QID) | ORAL | Status: DC | PRN

## 2018-10-08 MED ORDER — ALBUTEROL SULFATE (2.5 MG/3ML) 0.083% IN NEBU
2.5000 mg | INHALATION_SOLUTION | Freq: Three times a day (TID) | RESPIRATORY_TRACT | Status: DC
  Administered 2018-10-08 – 2018-10-09 (×4): 2.5 mg via RESPIRATORY_TRACT
  Filled 2018-10-08 (×4): qty 3

## 2018-10-08 MED ORDER — SODIUM CHLORIDE (PF) 0.9 % IJ SOLN
INTRAMUSCULAR | Status: AC
  Filled 2018-10-08: qty 50

## 2018-10-08 MED ORDER — DULOXETINE HCL 60 MG PO CPEP
60.0000 mg | ORAL_CAPSULE | Freq: Every day | ORAL | Status: DC
  Administered 2018-10-08: 60 mg via ORAL
  Filled 2018-10-08: qty 1

## 2018-10-08 MED ORDER — DULOXETINE HCL 30 MG PO CPEP: 30.0000 mg | ORAL_CAPSULE | ORAL | Status: DC

## 2018-10-08 MED ORDER — PRAVASTATIN SODIUM 40 MG PO TABS
40.0000 mg | ORAL_TABLET | Freq: Every day | ORAL | Status: DC
  Administered 2018-10-08 – 2018-10-09 (×2): 40 mg via ORAL
  Filled 2018-10-08 (×2): qty 1

## 2018-10-08 MED ORDER — LEVOTHYROXINE SODIUM 50 MCG PO TABS
75.0000 ug | ORAL_TABLET | Freq: Every day | ORAL | Status: DC
  Administered 2018-10-08 – 2018-10-09 (×2): 75 ug via ORAL
  Filled 2018-10-08 (×3): qty 1

## 2018-10-08 MED ORDER — METHYLPREDNISOLONE SODIUM SUCC 40 MG IJ SOLR
40.0000 mg | Freq: Two times a day (BID) | INTRAMUSCULAR | Status: DC
  Administered 2018-10-08 – 2018-10-09 (×3): 40 mg via INTRAVENOUS
  Filled 2018-10-08 (×3): qty 1

## 2018-10-08 MED ORDER — IOHEXOL 350 MG/ML SOLN
100.0000 mL | Freq: Once | INTRAVENOUS | Status: AC | PRN
  Administered 2018-10-08: 100 mL via INTRAVENOUS

## 2018-10-08 MED ORDER — VITAMIN B-12 1000 MCG PO TABS
1000.0000 ug | ORAL_TABLET | Freq: Every day | ORAL | Status: DC
  Administered 2018-10-08 – 2018-10-09 (×2): 1000 ug via ORAL
  Filled 2018-10-08 (×3): qty 1

## 2018-10-08 MED ORDER — DULOXETINE HCL 30 MG PO CPEP
30.0000 mg | ORAL_CAPSULE | Freq: Every day | ORAL | Status: DC
  Administered 2018-10-08 – 2018-10-09 (×2): 30 mg via ORAL
  Filled 2018-10-08 (×2): qty 1

## 2018-10-08 MED ORDER — ALBUTEROL SULFATE (2.5 MG/3ML) 0.083% IN NEBU: 2.5000 mg | INHALATION_SOLUTION | RESPIRATORY_TRACT | Status: DC | PRN

## 2018-10-08 MED ORDER — ALBUTEROL SULFATE (2.5 MG/3ML) 0.083% IN NEBU
2.5000 mg | INHALATION_SOLUTION | Freq: Four times a day (QID) | RESPIRATORY_TRACT | Status: DC
  Administered 2018-10-08: 2.5 mg via RESPIRATORY_TRACT
  Filled 2018-10-08: qty 3

## 2018-10-08 MED ORDER — PANTOPRAZOLE SODIUM 40 MG PO TBEC
40.0000 mg | DELAYED_RELEASE_TABLET | Freq: Every day | ORAL | Status: DC
  Administered 2018-10-08 – 2018-10-09 (×2): 40 mg via ORAL
  Filled 2018-10-08 (×2): qty 1

## 2018-10-08 MED ORDER — ENOXAPARIN SODIUM 40 MG/0.4ML ~~LOC~~ SOLN
40.0000 mg | Freq: Every day | SUBCUTANEOUS | Status: DC
  Administered 2018-10-08 – 2018-10-09 (×2): 40 mg via SUBCUTANEOUS
  Filled 2018-10-08 (×3): qty 0.4

## 2018-10-08 MED ORDER — PREGABALIN 50 MG PO CAPS
100.0000 mg | ORAL_CAPSULE | Freq: Two times a day (BID) | ORAL | Status: DC
  Administered 2018-10-08 – 2018-10-09 (×4): 100 mg via ORAL
  Filled 2018-10-08 (×4): qty 2

## 2018-10-08 MED ORDER — METOPROLOL SUCCINATE ER 50 MG PO TB24
50.0000 mg | ORAL_TABLET | Freq: Every day | ORAL | Status: DC
  Administered 2018-10-08 – 2018-10-09 (×2): 50 mg via ORAL
  Filled 2018-10-08 (×3): qty 1

## 2018-10-08 MED ORDER — HYDROCODONE-ACETAMINOPHEN 5-325 MG PO TABS
1.0000 | ORAL_TABLET | ORAL | Status: DC | PRN
  Administered 2018-10-08: 1 via ORAL
  Filled 2018-10-08: qty 1

## 2018-10-08 MED ORDER — ONDANSETRON HCL 4 MG/2ML IJ SOLN: 4.0000 mg | Freq: Four times a day (QID) | INTRAMUSCULAR | Status: DC | PRN

## 2018-10-08 MED ORDER — CYCLOSPORINE 0.05 % OP EMUL
1.0000 [drp] | Freq: Two times a day (BID) | OPHTHALMIC | Status: DC
  Administered 2018-10-08 – 2018-10-09 (×4): 1 [drp] via OPHTHALMIC
  Filled 2018-10-08 (×6): qty 30

## 2018-10-08 MED ORDER — ALBUTEROL SULFATE (2.5 MG/3ML) 0.083% IN NEBU: 2.5000 mg | INHALATION_SOLUTION | RESPIRATORY_TRACT | Status: DC

## 2018-10-08 MED ORDER — ACETAMINOPHEN 650 MG RE SUPP: 650.0000 mg | Freq: Four times a day (QID) | RECTAL | Status: DC | PRN

## 2018-10-08 MED ORDER — ONDANSETRON HCL 4 MG PO TABS: 4.0000 mg | ORAL_TABLET | Freq: Four times a day (QID) | ORAL | Status: DC | PRN

## 2018-10-08 NOTE — ED Notes (Addendum)
Tech and I assisted pt to restroom with steady lift and back to bed.

## 2018-10-08 NOTE — Care Management Obs Status (Signed)
[facility] NOTIFICATION   Patient Details  Name: Lindsey Rowe MRN: [id number] Date of Birth: 12/22/1945   Medicare Observation Status Notification Given:  Yes    Purcell Mouton, RN 10/08/2018, 1:18 PM

## 2018-10-08 NOTE — Progress Notes (Signed)
[name]  PROGRESS NOTE    ANGELIGUE RAPPLEYEA  JTT:[id number] DOB: 19-Mar-1945 DOA: 10/07/2018 PCP: Maurice Small, MD   Brief Narrative:   Lindsey Rowe is a 73 y.o. female with history of hypertension, hyperlipidemia, hypothyroidism, diastolic dysfunction per 2D echo done in May 2019 and history of thoracic aortic aneurysm has been experiencing shortness of breath with wheezing and cough for the last 3 days.  Over the last 24 hours it worsened and patient called EMS.  Denies any chest pain fever or chills.  Denies any sick contacts.  On arrival EMS had to place patient on nonrebreather.  Was found to be wheezing.   Assessment & Plan:   Principal Problem:   Acute respiratory failure with hypoxia (HCC) Active Problems:   HYPERTENSION, BENIGN ESSENTIAL   Hypothyroid   Diastolic dysfunction   Asthmatic bronchitis   COPD exacerbation (HCC)  Acute respiratory failure with hypoxia suspect likely from asthmatic bronchitis.       - Patient does not smoke cigarettes.       - continue on nebulizer IV steroids.       - Since patient becomes very tachypneic and tachycardic on minimal exertion; d-dimer is elevate, check CTA chest     - No obvious signs of any fluid overload, but check BNP      History of diastolic dysfunction per 2D echo done in May 2019 which showed EF of 60 to 65%.       - Presently does not look any signs of fluid overload.  Hypertension     - lisinopril and metoprolol.  Hypothyroidism     - Synthroid.  History of thoracic ascending aortic aneurysm     - being followed by Dr. Virgel Gess cardiothoracic surgeon and per note from cardiothoracic surgery in March 2019 plan is to follow-up in 2 years.       - Denies any chest pain at this time.  Hyperlipidemia     - statins  Still symptomatic when weaning O2. Elevated d-dimer. Check CTA chest. Also check BNP. Continue steroids, breathing tx. Let's get PT exam.  DVT prophylaxis: lovenox Code Status: FULL   Disposition Plan: TBD    ROS:  Denies CP, N, V, palpitations. Reports dyspnea, DOE. Remainder 10-pt ROS is negative for all not previously mentioned.  Subjective: "It is still a little difficult to breathe."  Objective: Vitals:   10/08/18 0500 10/08/18 0702 10/08/18 0738 10/08/18 0813  BP: 139/84 127/77  (!) 169/98  Pulse: 90 86 79 100  Resp:  17 17 20   Temp:      SpO2: 96% 95% 97% 96%  Weight:    115.1 kg  Height:    5\' 3"  (1.6 m)   No intake or output data in the 24 hours ending 10/08/18 1515 Filed Weights   10/07/18 1600 10/08/18 0813  Weight: 113.4 kg 115.1 kg    Examination:  General: 73 y.o. female resting in bed in NAD Eyes: PERRL, normal sclera ENMT: Nares patent w/o discharge, orophaynx clear, dentition normal, ears w/o discharge/lesions/ulcers Cardiovascular: RRR, +S1, S2, no m/g/r, equal pulses throughout Respiratory: decreased at bases, bilat wheeze noted GI: BS+, NDNT, no masses noted, no organomegaly noted MSK: No e/c/c Skin: No rashes, bruises, ulcerations noted Neuro: A&O x 3, no focal deficits Psyc: Appropriate interaction and affect, calm/cooperative   Data Reviewed: I have personally reviewed following labs and imaging studies.  CBC: Recent Labs  Lab 10/07/18 1710 10/08/18 0506  WBC 5.8 5.1  HGB 12.9  12.7  HCT 41.5 40.7  MCV 101.2* 99.3  PLT 204 756   Basic Metabolic Panel: Recent Labs  Lab 10/07/18 1710 10/08/18 0506  NA 142 138  K 4.1 4.1  CL 101 100  CO2 31 29  GLUCOSE 111* 223*  BUN 15 21  CREATININE 0.94 1.06*  CALCIUM 9.4 9.3   GFR: Estimated Creatinine Clearance: 59.6 mL/min (A) (by C-G formula based on SCr of 1.06 mg/dL (H)). Liver Function Tests: No results for input(s): AST, ALT, ALKPHOS, BILITOT, PROT, ALBUMIN in the last 168 hours. No results for input(s): LIPASE, AMYLASE in the last 168 hours. No results for input(s): AMMONIA in the last 168 hours. Coagulation Profile: No results for input(s): INR, PROTIME in the last 168 hours.  Cardiac Enzymes: No results for input(s): CKTOTAL, CKMB, CKMBINDEX, TROPONINI in the last 168 hours. BNP (last 3 results) No results for input(s): PROBNP in the last 8760 hours. HbA1C: No results for input(s): HGBA1C in the last 72 hours. CBG: No results for input(s): GLUCAP in the last 168 hours. Lipid Profile: No results for input(s): CHOL, HDL, LDLCALC, TRIG, CHOLHDL, LDLDIRECT in the last 72 hours. Thyroid Function Tests: No results for input(s): TSH, T4TOTAL, FREET4, T3FREE, THYROIDAB in the last 72 hours. Anemia Panel: No results for input(s): VITAMINB12, FOLATE, FERRITIN, TIBC, IRON, RETICCTPCT in the last 72 hours. Sepsis Labs: No results for input(s): PROCALCITON, LATICACIDVEN in the last 168 hours.  Recent Results (from the past 240 hour(s))  SARS Coronavirus 2 [facility] order, Performed in [facility] hospital lab) Nasopharyngeal Nasopharyngeal Swab     Status: None   Collection Time: 10/07/18  6:05 PM   Specimen: Nasopharyngeal Swab  Result Value Ref Range Status   SARS Coronavirus 2 NEGATIVE NEGATIVE Final    Comment: (NOTE) If result is NEGATIVE SARS-CoV-2 target nucleic acids are NOT DETECTED. The SARS-CoV-2 RNA is generally detectable in upper and lower  respiratory specimens during the acute phase of infection. The lowest  concentration of SARS-CoV-2 viral copies this assay can detect is 250  copies / mL. A negative result does not preclude SARS-CoV-2 infection  and should not be used as the sole basis for treatment or other  patient management decisions.  A negative result may occur with  improper specimen collection / handling, submission of specimen other  than nasopharyngeal swab, presence of viral mutation(s) within the  areas targeted by this assay, and inadequate number of viral copies  (<250 copies / mL). A negative result must be combined with clinical  observations, patient history, and epidemiological information. If result is POSITIVE SARS-CoV-2  target nucleic acids are DETECTED. The SARS-CoV-2 RNA is generally detectable in upper and lower  respiratory specimens dur ing the acute phase of infection.  Positive  results are indicative of active infection with SARS-CoV-2.  Clinical  correlation with patient history and other diagnostic information is  necessary to determine patient infection status.  Positive results do  not rule out bacterial infection or co-infection with other viruses. If result is PRESUMPTIVE POSTIVE SARS-CoV-2 nucleic acids MAY BE PRESENT.   A presumptive positive result was obtained on the submitted specimen  and confirmed on repeat testing.  While 2019 novel coronavirus  (SARS-CoV-2) nucleic acids may be present in the submitted sample  additional confirmatory testing may be necessary for epidemiological  and / or clinical management purposes  to differentiate between  SARS-CoV-2 and other Sarbecovirus currently known to infect humans.  If clinically indicated additional testing with an alternate test  methodology [phone]([id number]) is advised. The SARS-CoV-2 RNA is generally  detectable in upper and lower respiratory sp ecimens during the acute  phase of infection. The expected result is Negative. Fact Sheet for Patients:  BoilerBrush.com.cyhttps://www.fda.gov/media/136312/download Fact Sheet for Healthcare Providers: https://pope.com/https://www.fda.gov/media/136313/download This test is not yet approved or cleared by the [place] FDA and has been authorized for detection and/or diagnosis of SARS-CoV-2 by FDA under an Emergency Use Authorization (EUA).  This EUA will remain in effect (meaning this test can be used) for the duration of the COVID-19 declaration under Section 564(b)(1) of the Act, 21 U.S.C. section 360bbb-3(b)(1), unless the authorization is terminated or revoked sooner. Performed at [facility], [address] [place], [place] [address]       Radiology Studies: Ct Head Wo Contrast  Result Date:  10/07/2018 CLINICAL DATA:  Ataxia. EXAM: CT HEAD WITHOUT CONTRAST TECHNIQUE: Contiguous axial images were obtained from the base of the skull through the vertex without intravenous contrast. COMPARISON:  CT head dated Jun 09, 2017. FINDINGS: Brain: No evidence of acute infarction, hemorrhage, hydrocephalus, extra-axial collection or mass lesion/mass effect. Age related atrophy is noted. Chronic microvascular ischemic changes are noted. Vascular: No hyperdense vessel or unexpected calcification. Skull: Normal. Negative for fracture or focal lesion. Sinuses/Orbits: No acute finding. The patient is status post bilateral cataract surgery. Other: None. IMPRESSION: No acute intracranial abnormality. Electronically Signed   By: Katherine Mantlehristopher  Green M.D.   On: 10/07/2018 23:14   Dg Chest Port 1 View  Result Date: 10/07/2018 CLINICAL DATA:  Shortness of breath. EXAM: PORTABLE CHEST 1 VIEW COMPARISON:  April 07, 2018 FINDINGS: There is a new density in the right upper lobe favored to represent a healing posterior fracture of the third rib. The heart size is stable from prior study. There is no pneumothorax. No large pleural effusion. There are degenerative changes of both shoulders, right worse than left. There is elevation of the right hemidiaphragm which is similar to prior study. IMPRESSION: No active disease. Electronically Signed   By: Katherine Mantlehristopher  Green M.D.   On: 10/07/2018 16:53     Scheduled Meds: . albuterol  2.5 mg Nebulization TID  . cycloSPORINE  1 drop Both Eyes BID  . DULoxetine  30 mg Oral Daily  . DULoxetine  60 mg Oral QHS  . enoxaparin (LOVENOX) injection  40 mg Subcutaneous Daily  . levothyroxine  75 mcg Oral QAC breakfast  . lisinopril  10 mg Oral Daily  . methylPREDNISolone (SOLU-MEDROL) injection  40 mg Intravenous Q12H  . metoprolol succinate  50 mg Oral Daily  . pantoprazole  40 mg Oral Daily  . pravastatin  40 mg Oral Daily  . pregabalin  100 mg Oral BID  . vitamin B-12  1,000 mcg Oral  Daily   Continuous Infusions:   LOS: 0 days    Time spent: 25 minutes spent in the coordination of care today.    Teddy Spikeyrone A Dinh Ayotte, DO [facility] Pager [phone]  If 7PM-7AM, please contact night-coverage [email or web address] Password [email or web address] 10/08/2018, 3:15 PM

## 2018-10-08 NOTE — ED Notes (Signed)
ED TO INPATIENT HANDOFF REPORT  ED Nurse Name and Phone #: Joellen Jersey, RN  S Name/Age/Gender Lindsey Rowe 73 y.o. female Room/Bed: WA04/WA04  Code Status   Code Status: Full Code  Home/SNF/Other Home Patient oriented to: self, place, time and situation Is this baseline? Yes   Triage Complete: Triage complete  Chief Complaint Difficulty Breathing Chronic COPD  Triage Note EMS reports from home, SOB since Sunday, Hx of COPD. Sp02 92 on scene nasal cannula @ 2 lts/.  BP 151/90 HR 98 RR 20 Sp02 100 on Non rebreather !@ 10ltrs  Temp 97.5   Allergies Allergies  Allergen Reactions  . Gabapentin Other (See Comments)    Right foot and leg swelled   . Cefadroxil Itching    Ends of hair itched     Level of Care/Admitting Diagnosis ED Disposition    ED Disposition Condition Comment   Admit  Hospital Area: [facility] [[id number]]  Level of Care: Telemetry [5]  Admit to tele based on following criteria: Monitor for Ischemic changes  Covid Evaluation: N/A  Diagnosis: Acute respiratory failure with hypoxia [facility]) [[id number]]  Admitting Physician: Rise Patience [phone]  Attending Physician: Rise Patience [email or web address]  PT Class (Do Not Modify): Observation [104]  PT Acc Code (Do Not Modify): Observation [10022]       B Medical/Surgery History Past Medical History:  Diagnosis Date  . Arthritis   . Back pain   . Bipolar 1 disorder ([place])   . Complication of anesthesia    hard to wake up anesthesia  . Depression   . GERD (gastroesophageal reflux disease)   . Headache(784.0)    migraines and tension headaches  . Hyperlipidemia   . Hypertension   . Hypothyroidism   . Thyroid disease    Past Surgical History:  Procedure Laterality Date  . APPENDECTOMY    . BACK SURGERY    . EYE SURGERY Bilateral    lens implant  . GASTRIC BYPASS    . LOOP RECORDER INSERTION N/A 05/25/2016   Procedure: Loop Recorder Insertion;  Surgeon: Will Meredith Leeds, MD;   Location: [facility];  Service: Cardiovascular;  Laterality: N/A;  . LUMBAR LAMINECTOMY/DECOMPRESSION MICRODISCECTOMY Right 07/23/2012   Procedure: LUMBAR LAMINECTOMY/DECOMPRESSION MICRODISCECTOMY 2 LEVELS;  Surgeon: Floyce Stakes, MD;  Location: [facility];  Service: Neurosurgery;  Laterality: Right;  Right Lumbar three-four  lumbar four-five Laminectomy/Foraminotomy  . NASAL SINUS SURGERY Bilateral   . TONSILLECTOMY    . WRIST SURGERY       A IV Location/Drains/Wounds Patient Lines/Drains/Airways Status   Active Line/Drains/Airways    Name:   Placement date:   Placement time:   Site:   Days:   Peripheral IV 10/07/18 Right Antecubital   10/07/18    1714    Antecubital   1          Intake/Output Last 24 hours No intake or output data in the 24 hours ending 10/08/18 1308  Labs/Imaging Results for orders placed or performed during the hospital encounter of 10/07/18 (from the past 48 hour(s))  CBC     Status: Abnormal   Collection Time: 10/07/18  5:10 PM  Result Value Ref Range   WBC 5.8 4.0 - 10.5 K/uL   RBC 4.10 3.87 - 5.11 MIL/uL   Hemoglobin 12.9 12.0 - 15.0 g/dL   HCT 41.5 36.0 - 46.0 %   MCV 101.2 (H) 80.0 - 100.0 fL   MCH 31.5 26.0 - 34.0 pg  MCHC 31.1 30.0 - 36.0 g/dL   RDW 16.115.0 09.611.5 - 04.515.5 %   Platelets 204 150 - 400 K/uL   nRBC 0.0 0.0 - 0.2 %    Comment: Performed at [facility], [address] [place], [place] [address]  Basic metabolic panel     Status: Abnormal   Collection Time: 10/07/18  5:10 PM  Result Value Ref Range   Sodium 142 135 - 145 mmol/L   Potassium 4.1 3.5 - 5.1 mmol/L   Chloride 101 98 - 111 mmol/L   CO2 31 22 - 32 mmol/L   Glucose, Bld 111 (H) 70 - 99 mg/dL   BUN 15 8 - 23 mg/dL   Creatinine, Ser 1.910.94 0.44 - 1.00 mg/dL   Calcium 9.4 8.9 - 47.810.3 mg/dL   GFR calc non Af Amer >60 >60 mL/min   GFR calc Af Amer >60 >60 mL/min   Anion gap 10 5 - 15    Comment: Performed at [facility], [address] [place], [place] [address]  Brain natriuretic peptide     Status: None   Collection Time: 10/07/18  5:10 PM  Result Value Ref Range   B Natriuretic Peptide 45.0 0.0 - 100.0 pg/mL    Comment: Performed at [facility], [address] [place], [place] [address]  SARS Coronavirus 2 [facility](Hospital order, Performed in [facility] lab) Nasopharyngeal Nasopharyngeal Swab     Status: None   Collection Time: 10/07/18  6:05 PM   Specimen: Nasopharyngeal Swab  Result Value Ref Range   SARS Coronavirus 2 NEGATIVE NEGATIVE    Comment: (NOTE) If result is NEGATIVE SARS-CoV-2 target nucleic acids are NOT DETECTED. The SARS-CoV-2 RNA is generally detectable in upper and lower  respiratory specimens during the acute phase of infection. The lowest  concentration of SARS-CoV-2 viral copies this assay can detect is 250  copies / mL. A negative result does not preclude SARS-CoV-2 infection  and should not be used as the sole basis for treatment or other  patient management decisions.  A negative result may occur with  improper specimen collection / handling, submission of specimen other  than nasopharyngeal swab, presence of viral mutation(s) within the  areas targeted by this assay, and inadequate number of viral copies  (<250 copies / mL). A negative result must be combined with clinical  observations, patient history, and epidemiological information. If result is POSITIVE SARS-CoV-2 target nucleic acids are DETECTED. The SARS-CoV-2 RNA is generally detectable in upper and lower  respiratory specimens dur ing the acute phase of infection.  Positive  results are indicative of active infection with SARS-CoV-2.  Clinical  correlation with patient history and other diagnostic information is  necessary to determine patient infection status.  Positive results do  not rule out bacterial infection or co-infection with other viruses. If result is PRESUMPTIVE  POSTIVE SARS-CoV-2 nucleic acids MAY BE PRESENT.   A presumptive positive result was obtained on the submitted specimen  and confirmed on repeat testing.  While 2019 novel coronavirus  (SARS-CoV-2) nucleic acids may be present in the submitted sample  additional confirmatory testing may be necessary for epidemiological  and / or clinical management purposes  to differentiate between  SARS-CoV-2 and other Sarbecovirus currently known to infect humans.  If clinically indicated additional testing with an alternate test  methodology [phone]([id number]) is advised. The SARS-CoV-2 RNA is generally  detectable in upper and lower respiratory sp ecimens during the acute  phase of infection. The expected result is Negative. Fact Sheet for Patients:  BoilerBrush.com.cy Fact Sheet for Healthcare Providers: https://pope.com/ This test is not yet approved or cleared by the [place] FDA and has been authorized for detection and/or diagnosis of SARS-CoV-2 by FDA under an Emergency Use Authorization (EUA).  This EUA will remain in effect (meaning this test can be used) for the duration of the COVID-19 declaration under Section 564(b)(1) of the Act, 21 U.S.C. section 360bbb-3(b)(1), unless the authorization is terminated or revoked sooner. Performed at [facility], [address], [place], [place] [address]   Basic metabolic panel     Status: Abnormal   Collection Time: 10/08/18  5:06 AM  Result Value Ref Range   Sodium 138 135 - 145 mmol/L   Potassium 4.1 3.5 - 5.1 mmol/L   Chloride 100 98 - 111 mmol/L   CO2 29 22 - 32 mmol/L   Glucose, Bld 223 (H) 70 - 99 mg/dL   BUN 21 8 - 23 mg/dL   Creatinine, Ser 7.00 (H) 0.44 - 1.00 mg/dL   Calcium 9.3 8.9 - 17.4 mg/dL   GFR calc non Af Amer 53 (L) >60 mL/min   GFR calc Af Amer >60 >60 mL/min   Anion gap 9 5 - 15    Comment: Performed at [facility], [address],  [place], [place] [address]  CBC     Status: None   Collection Time: 10/08/18  5:06 AM  Result Value Ref Range   WBC 5.1 4.0 - 10.5 K/uL   RBC 4.10 3.87 - 5.11 MIL/uL   Hemoglobin 12.7 12.0 - 15.0 g/dL   HCT 75.9 16.3 - 84.6 %   MCV 99.3 80.0 - 100.0 fL   MCH 31.0 26.0 - 34.0 pg   MCHC 31.2 30.0 - 36.0 g/dL   RDW 65.9 93.5 - 70.1 %   Platelets 216 150 - 400 K/uL   nRBC 0.0 0.0 - 0.2 %    Comment: Performed at [facility], [address], [place], [place] [address]  D-dimer, quantitative (not at [facility])     Status: Abnormal   Collection Time: 10/08/18  5:06 AM  Result Value Ref Range   D-Dimer, Quant 0.92 (H) 0.00 - 0.50 ug/mL-FEU    Comment: (NOTE) At the manufacturer cut-off of 0.50 ug/mL FEU, this assay has been documented to exclude PE with a sensitivity and negative predictive value of 97 to 99%.  At this time, this assay has not been approved by the FDA to exclude DVT/VTE. Results should be correlated with clinical presentation. Performed at [facility], [address], [place], [place] [address]    Ct Head Wo Contrast  Result Date: 10/07/2018 CLINICAL DATA:  Ataxia. EXAM: CT HEAD WITHOUT CONTRAST TECHNIQUE: Contiguous axial images were obtained from the base of the skull through the vertex without intravenous contrast. COMPARISON:  CT head dated Jun 09, 2017. FINDINGS: Brain: No evidence of acute infarction, hemorrhage, hydrocephalus, extra-axial collection or mass lesion/mass effect. Age related atrophy is noted. Chronic microvascular ischemic changes are noted. Vascular: No hyperdense vessel or unexpected calcification. Skull: Normal. Negative for fracture or focal lesion. Sinuses/Orbits: No acute finding. The patient is status post bilateral cataract surgery. Other: None. IMPRESSION: No acute intracranial abnormality. Electronically Signed   By: Katherine Mantle M.D.   On: 10/07/2018 23:14   Dg Chest Port 1 View  Result Date:  10/07/2018 CLINICAL DATA:  Shortness of breath. EXAM:  PORTABLE CHEST 1 VIEW COMPARISON:  April 07, 2018 FINDINGS: There is a new density in the right upper lobe favored to represent a healing posterior fracture of the third rib. The heart size is stable from prior study. There is no pneumothorax. No large pleural effusion. There are degenerative changes of both shoulders, right worse than left. There is elevation of the right hemidiaphragm which is similar to prior study. IMPRESSION: No active disease. Electronically Signed   By: Katherine Mantlehristopher  Green M.D.   On: 10/07/2018 16:53    Pending Labs Unresulted Labs (From admission, onward)    Start     Ordered   10/08/18 0506  Creatinine, serum  (enoxaparin (LOVENOX)    CrCl >/= 30 ml/min)  ONCE - STAT,   STAT    Comments: Baseline for enoxaparin therapy IF NOT ALREADY DRAWN.    10/08/18 0505          Vitals/Pain Today's Vitals   10/08/18 0230 10/08/18 0300 10/08/18 0331 10/08/18 0500  BP: (!) 156/82 (!) 156/89  139/84  Pulse: (!) 106 (!) 103  90  Resp:      Temp:      SpO2: 95% 96%  96%  Weight:      Height:      PainSc:   4      Isolation Precautions No active isolations  Medications Medications  HYDROcodone-acetaminophen (NORCO/VICODIN) 5-325 MG per tablet 1 tablet (1 tablet Oral Given 10/08/18 0225)  lisinopril (ZESTRIL) tablet 10 mg (10 mg Oral Given 10/08/18 0225)  metoprolol succinate (TOPROL-XL) 24 hr tablet 50 mg (has no administration in time range)  pravastatin (PRAVACHOL) tablet 40 mg (has no administration in time range)  DULoxetine (CYMBALTA) DR capsule 30-60 mg (has no administration in time range)  levothyroxine (SYNTHROID) tablet 75 mcg (75 mcg Oral Given 10/08/18 0559)  pantoprazole (PROTONIX) EC tablet 40 mg (has no administration in time range)  vitamin B-12 (CYANOCOBALAMIN) tablet 1,000 mcg (has no administration in time range)  pregabalin (LYRICA) capsule 100 mg (100 mg Oral Given 10/08/18 0100)  cycloSPORINE (RESTASIS)  0.05 % ophthalmic emulsion 1 drop (1 drop Both Eyes Given 10/08/18 0329)  acetaminophen (TYLENOL) tablet 650 mg (has no administration in time range)    Or  acetaminophen (TYLENOL) suppository 650 mg (has no administration in time range)  ondansetron (ZOFRAN) tablet 4 mg (has no administration in time range)    Or  ondansetron (ZOFRAN) injection 4 mg (has no administration in time range)  enoxaparin (LOVENOX) injection 40 mg (has no administration in time range)  albuterol (PROVENTIL) (2.5 MG/3ML) 0.083% nebulizer solution 2.5 mg (has no administration in time range)  methylPREDNISolone sodium succinate (SOLU-MEDROL) 40 mg/mL injection 40 mg (has no administration in time range)  albuterol (PROVENTIL) (2.5 MG/3ML) 0.083% nebulizer solution 2.5 mg (has no administration in time range)  predniSONE (DELTASONE) tablet 60 mg (60 mg Oral Given 10/07/18 2004)  albuterol (VENTOLIN HFA) 108 (90 Base) MCG/ACT inhaler 2 puff (2 puffs Inhalation Given 10/07/18 2005)  ipratropium-albuterol (DUONEB) 0.5-2.5 (3) MG/3ML nebulizer solution 3 mL (3 mLs Nebulization Given 10/07/18 2006)  acetaminophen (TYLENOL) tablet 1,000 mg (1,000 mg Oral Given 10/07/18 2004)    Mobility walks with person assist High fall risk   Focused Assessments NA   R Recommendations: See Admitting Provider Note  Report given to:   Additional Notes: NA

## 2018-10-09 ENCOUNTER — Other Ambulatory Visit: Payer: Self-pay

## 2018-10-09 LAB — CBC WITH DIFFERENTIAL/PLATELET
Abs Immature Granulocytes: 0.08 10*3/uL — ABNORMAL HIGH (ref 0.00–0.07)
Basophils Absolute: 0 10*3/uL (ref 0.0–0.1)
Basophils Relative: 0 %
Eosinophils Absolute: 0 10*3/uL (ref 0.0–0.5)
Eosinophils Relative: 0 %
HCT: 40.1 % (ref 36.0–46.0)
Hemoglobin: 12.2 g/dL (ref 12.0–15.0)
Immature Granulocytes: 1 %
Lymphocytes Relative: 11 %
Lymphs Abs: 1.2 10*3/uL (ref 0.7–4.0)
MCH: 30.9 pg (ref 26.0–34.0)
MCHC: 30.4 g/dL (ref 30.0–36.0)
MCV: 101.5 fL — ABNORMAL HIGH (ref 80.0–100.0)
Monocytes Absolute: 0.7 10*3/uL (ref 0.1–1.0)
Monocytes Relative: 7 %
Neutro Abs: 9.2 10*3/uL — ABNORMAL HIGH (ref 1.7–7.7)
Neutrophils Relative %: 81 %
Platelets: 241 10*3/uL (ref 150–400)
RBC: 3.95 MIL/uL (ref 3.87–5.11)
RDW: 14.9 % (ref 11.5–15.5)
WBC: 11.2 10*3/uL — ABNORMAL HIGH (ref 4.0–10.5)
nRBC: 0 % (ref 0.0–0.2)

## 2018-10-09 LAB — RENAL FUNCTION PANEL
Albumin: 3.5 g/dL (ref 3.5–5.0)
Anion gap: 10 (ref 5–15)
BUN: 24 mg/dL — ABNORMAL HIGH (ref 8–23)
CO2: 28 mmol/L (ref 22–32)
Calcium: 9.3 mg/dL (ref 8.9–10.3)
Chloride: 101 mmol/L (ref 98–111)
Creatinine, Ser: 0.91 mg/dL (ref 0.44–1.00)
GFR calc Af Amer: >60 mL/min (ref >60)
GFR calc non Af Amer: >60 mL/min (ref >60)
Glucose, Bld: 124 mg/dL — ABNORMAL HIGH (ref 70–99)
Phosphorus: 3.2 mg/dL (ref 2.5–4.6)
Potassium: 4.8 mmol/L (ref 3.5–5.1)
Sodium: 139 mmol/L (ref 135–145)

## 2018-10-09 LAB — MAGNESIUM: Magnesium: 2.3 mg/dL (ref 1.7–2.4)

## 2018-10-09 MED ORDER — PREDNISONE 10 MG PO TABS: ORAL_TABLET | ORAL | 0 refills | Status: AC

## 2018-10-09 MED ORDER — ALBUTEROL SULFATE 1.25 MG/3ML IN NEBU: 1.0000 | INHALATION_SOLUTION | Freq: Four times a day (QID) | RESPIRATORY_TRACT | 0 refills | Status: DC | PRN

## 2018-10-09 NOTE — Evaluation (Signed)
Physical Therapy Evaluation Patient Details Name: Lindsey Rowe MRN: [id number] DOB: [birth year] Today's Date: 10/09/2018   History of Present Illness  73yo female with increased SOB and wheezing, on non-rebreather with EMS. Covid test negative. Admitted for acute respiratory failure with hypoxia. PMH bipolar disorder, HTN, back surgery, diastolic dysfunction, thoracic ascending aneurysm  Clinical Impression   Patient received walking in room with nurse tech, very excited about participating in PT this morning. Able to complete functional transfers and gait approximately 35f with S and RW with sats remaining in the 90s with 2LPM O2 which she reports she does use at baseline. Able to perform bed mobility with Mod(I) and extended time and effort. She was left in bed with all needs met, bed alarm active, and RN present and attending. She will benefit from skilled PT services in the acute setting, strongly recommend skilled HHPT services to follow up with functional deficits moving forward.     Follow Up Recommendations Home health PT    Equipment Recommendations  None recommended by PT    Recommendations for Other Services       Precautions / Restrictions Precautions Precautions: Fall Restrictions Weight Bearing Restrictions: No      Mobility  Bed Mobility Overal bed mobility: Modified Independent             General bed mobility comments: extended time and increased effort  Transfers Overall transfer level: Needs assistance Equipment used: Rolling walker (2 wheeled) Transfers: Sit to/from Stand Sit to Stand: Supervision         General transfer comment: S for safety, good awareness of sequencing and hand placement  Ambulation/Gait Ambulation/Gait assistance: Supervision Gait Distance (Feet): 80 Feet Assistive device: Rolling walker (2 wheeled) Gait Pattern/deviations: Step-through pattern;Decreased step length - right;Decreased step length - left;Decreased stride  length;Trunk flexed Gait velocity: decreased   General Gait Details: gait slow and with increased effort; SpO2 on 2LPM in 90s  Stairs            Wheelchair Mobility    Modified Rankin (Stroke Patients Only)       Balance Overall balance assessment: Mild deficits observed, not formally tested;History of Falls                                           Pertinent Vitals/Pain Pain Assessment: No/denies pain    Home Living Family/patient expects to be discharged to:: Private residence Living Arrangements: Alone   Type of Home: Apartment Home Access: Level entry     Home Layout: One level Home Equipment: Walker - 4 wheels;Grab bars - toilet;Shower seat      Prior Function Level of Independence: Independent with assistive device(s)               Hand Dominance        Extremity/Trunk Assessment   Upper Extremity Assessment Upper Extremity Assessment: Generalized weakness    Lower Extremity Assessment Lower Extremity Assessment: Generalized weakness    Cervical / Trunk Assessment Cervical / Trunk Assessment: Kyphotic  Communication   Communication: No difficulties  Cognition Arousal/Alertness: Awake/alert Behavior During Therapy: WFL for tasks assessed/performed Overall Cognitive Status: Within Functional Limits for tasks assessed                                 General Comments: very pleasant but  also very talkative      General Comments      Exercises     Assessment/Plan    PT Assessment Patient needs continued PT services  PT Problem List Decreased strength;Decreased coordination;Obesity;Decreased activity tolerance;Cardiopulmonary status limiting activity;Decreased balance       PT Treatment Interventions DME instruction;Therapeutic activities;Gait training;Therapeutic exercise;Patient/family education;Stair training;Balance training;Functional mobility training;Neuromuscular re-education    PT Goals  (Current goals can be found in the Care Plan section)  Acute Rehab PT Goals Patient Stated Goal: go home PT Goal Formulation: With patient Time For Goal Achievement: 10/23/18 Potential to Achieve Goals: Good    Frequency Min 3X/week   Barriers to discharge        Co-evaluation               AM-PAC PT "6 Clicks" Mobility  Outcome Measure Help needed turning from your back to your side while in a flat bed without using bedrails?: None Help needed moving from lying on your back to sitting on the side of a flat bed without using bedrails?: None Help needed moving to and from a bed to a chair (including a wheelchair)?: A Little Help needed standing up from a chair using your arms (e.g., wheelchair or bedside chair)?: A Little Help needed to walk in hospital room?: A Little Help needed climbing 3-5 steps with a railing? : A Little 6 Click Score: 20    End of Session Equipment Utilized During Treatment: Oxygen Activity Tolerance: Patient tolerated treatment well Patient left: in bed;with call bell/phone within reach;with bed alarm set;with nursing/sitter in room Nurse Communication: Mobility status PT Visit Diagnosis: Unsteadiness on feet (R26.81);History of falling (Z91.81);Muscle weakness (generalized) (M62.81)    Time: 4356-8616 PT Time Calculation (min) (ACUTE ONLY): 25 min   Charges:   PT Evaluation $PT Eval Low Complexity: 1 Low PT Treatments $Gait Training: 8-22 mins        Deniece Ree PT, DPT, CBIS  Supplemental Physical Therapist [place]    Pager [phone] Acute Rehab Office [phone]

## 2018-10-09 NOTE — TOC Progression Note (Addendum)
Transition of Care [facility]) - Progression Note    Patient Details  Name: Lindsey Rowe MRN: [id number] Date of Birth: 1945-12-07  Transition of Care [facility]) CM/SW Contact  Purcell Mouton, RN Phone Number: 10/09/2018, 1:00 PM  Clinical Narrative:    Pt discharging home with [facility], referral given to in house rep. [employer].        Expected Discharge Plan and Services           Expected Discharge Date: (unknown)                                     Social Determinants of Health (SDOH) Interventions    Readmission Risk Interventions No flowsheet data found.

## 2018-10-09 NOTE — TOC Progression Note (Signed)
Transition of Care [facility]) - Progression Note    Patient Details  Name: Lindsey Rowe MRN: [id number] Date of Birth: [birth year]  Transition of Care [facility]) CM/SW Contact  Purcell Mouton, RN Phone Number: 10/09/2018, 3:32 PM  Clinical Narrative:     [facility] called for transportation home for pt.        Expected Discharge Plan and Services           Expected Discharge Date: 10/09/18                                     Social Determinants of Health (SDOH) Interventions    Readmission Risk Interventions No flowsheet data found.
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
## 2018-10-10 ENCOUNTER — Other Ambulatory Visit: Payer: Medicare HMO

## 2018-10-15 NOTE — Progress Notes (Signed)
Carelink Summary Report / Loop Recorder 

## 2018-10-20 ENCOUNTER — Other Ambulatory Visit: Payer: Self-pay | Admitting: Pulmonary Disease

## 2018-10-23 DIAGNOSIS — M1990 Unspecified osteoarthritis, unspecified site: Secondary | ICD-10-CM | POA: Diagnosis not present

## 2018-10-23 DIAGNOSIS — J449 Chronic obstructive pulmonary disease, unspecified: Secondary | ICD-10-CM | POA: Diagnosis not present

## 2018-10-23 DIAGNOSIS — E7800 Pure hypercholesterolemia, unspecified: Secondary | ICD-10-CM | POA: Diagnosis not present

## 2018-10-23 DIAGNOSIS — J45901 Unspecified asthma with (acute) exacerbation: Secondary | ICD-10-CM | POA: Diagnosis not present

## 2018-10-23 DIAGNOSIS — M797 Fibromyalgia: Secondary | ICD-10-CM | POA: Diagnosis not present

## 2018-10-23 DIAGNOSIS — J9691 Respiratory failure, unspecified with hypoxia: Secondary | ICD-10-CM | POA: Diagnosis not present

## 2018-10-23 DIAGNOSIS — N183 Chronic kidney disease, stage 3 (moderate): Secondary | ICD-10-CM | POA: Diagnosis not present

## 2018-10-23 DIAGNOSIS — I129 Hypertensive chronic kidney disease with stage 1 through stage 4 chronic kidney disease, or unspecified chronic kidney disease: Secondary | ICD-10-CM | POA: Diagnosis not present

## 2018-10-23 DIAGNOSIS — F319 Bipolar disorder, unspecified: Secondary | ICD-10-CM | POA: Diagnosis not present

## 2018-10-24 ENCOUNTER — Other Ambulatory Visit (HOSPITAL_COMMUNITY): Admission: RE | Admit: 2018-10-24 | Payer: Medicare HMO | Source: Ambulatory Visit

## 2018-10-28 ENCOUNTER — Ambulatory Visit: Payer: Medicare HMO | Admitting: Pulmonary Disease

## 2018-10-31 ENCOUNTER — Other Ambulatory Visit: Payer: Medicare HMO

## 2018-11-02 ENCOUNTER — Emergency Department (HOSPITAL_COMMUNITY): Payer: Medicare HMO

## 2018-11-02 ENCOUNTER — Emergency Department (HOSPITAL_COMMUNITY)
Admission: EM | Admit: 2018-11-02 | Discharge: 2018-11-03 | Disposition: A | Discharge status: Home / Self Care | Payer: Medicare HMO | Attending: Emergency Medicine | Admitting: Emergency Medicine

## 2018-11-02 ENCOUNTER — Encounter (HOSPITAL_COMMUNITY): Payer: Self-pay

## 2018-11-02 ENCOUNTER — Other Ambulatory Visit: Payer: Self-pay

## 2018-11-02 DIAGNOSIS — Z79899 Other long term (current) drug therapy: Secondary | ICD-10-CM | POA: Insufficient documentation

## 2018-11-02 DIAGNOSIS — I10 Essential (primary) hypertension: Secondary | ICD-10-CM | POA: Insufficient documentation

## 2018-11-02 DIAGNOSIS — R0789 Other chest pain: Secondary | ICD-10-CM | POA: Diagnosis not present

## 2018-11-02 DIAGNOSIS — F319 Bipolar disorder, unspecified: Secondary | ICD-10-CM | POA: Diagnosis not present

## 2018-11-02 DIAGNOSIS — J449 Chronic obstructive pulmonary disease, unspecified: Secondary | ICD-10-CM | POA: Insufficient documentation

## 2018-11-02 DIAGNOSIS — E039 Hypothyroidism, unspecified: Secondary | ICD-10-CM | POA: Diagnosis not present

## 2018-11-02 DIAGNOSIS — Z20828 Contact with and (suspected) exposure to other viral communicable diseases: Secondary | ICD-10-CM | POA: Insufficient documentation

## 2018-11-02 DIAGNOSIS — R0689 Other abnormalities of breathing: Secondary | ICD-10-CM | POA: Diagnosis not present

## 2018-11-02 DIAGNOSIS — Z9884 Bariatric surgery status: Secondary | ICD-10-CM | POA: Insufficient documentation

## 2018-11-02 DIAGNOSIS — R600 Localized edema: Secondary | ICD-10-CM | POA: Diagnosis not present

## 2018-11-02 DIAGNOSIS — R52 Pain, unspecified: Secondary | ICD-10-CM | POA: Diagnosis not present

## 2018-11-02 DIAGNOSIS — R0602 Shortness of breath: Secondary | ICD-10-CM | POA: Diagnosis not present

## 2018-11-02 DIAGNOSIS — R069 Unspecified abnormalities of breathing: Secondary | ICD-10-CM | POA: Diagnosis not present

## 2018-11-02 DIAGNOSIS — Z209 Contact with and (suspected) exposure to unspecified communicable disease: Secondary | ICD-10-CM | POA: Diagnosis not present

## 2018-11-02 LAB — COMPREHENSIVE METABOLIC PANEL
ALT: 11 U/L (ref 0–44)
AST: 20 U/L (ref 15–41)
Albumin: 3.6 g/dL (ref 3.5–5.0)
Alkaline Phosphatase: 70 U/L (ref 38–126)
Anion gap: 12 (ref 5–15)
BUN: 16 mg/dL (ref 8–23)
CO2: 28 mmol/L (ref 22–32)
Calcium: 9.3 mg/dL (ref 8.9–10.3)
Chloride: 99 mmol/L (ref 98–111)
Creatinine, Ser: 0.9 mg/dL (ref 0.44–1.00)
GFR calc Af Amer: >60 mL/min (ref >60)
GFR calc non Af Amer: >60 mL/min (ref >60)
Glucose, Bld: 139 mg/dL — ABNORMAL HIGH (ref 70–99)
Potassium: 3.6 mmol/L (ref 3.5–5.1)
Sodium: 139 mmol/L (ref 135–145)
Total Bilirubin: 0.5 mg/dL (ref 0.3–1.2)
Total Protein: 5.9 g/dL — ABNORMAL LOW (ref 6.5–8.1)

## 2018-11-02 LAB — CBC WITH DIFFERENTIAL/PLATELET
Abs Immature Granulocytes: 0.05 10*3/uL (ref 0.00–0.07)
Basophils Absolute: 0 10*3/uL (ref 0.0–0.1)
Basophils Relative: 0 %
Eosinophils Absolute: 0.1 10*3/uL (ref 0.0–0.5)
Eosinophils Relative: 1 %
HCT: 39.5 % (ref 36.0–46.0)
Hemoglobin: 12.7 g/dL (ref 12.0–15.0)
Immature Granulocytes: 1 %
Lymphocytes Relative: 24 %
Lymphs Abs: 2.3 10*3/uL (ref 0.7–4.0)
MCH: 31.8 pg (ref 26.0–34.0)
MCHC: 32.2 g/dL (ref 30.0–36.0)
MCV: 98.8 fL (ref 80.0–100.0)
Monocytes Absolute: 0.8 10*3/uL (ref 0.1–1.0)
Monocytes Relative: 9 %
Neutro Abs: 6 10*3/uL (ref 1.7–7.7)
Neutrophils Relative %: 65 %
Platelets: 199 10*3/uL (ref 150–400)
RBC: 4 MIL/uL (ref 3.87–5.11)
RDW: 14.5 % (ref 11.5–15.5)
WBC: 9.2 10*3/uL (ref 4.0–10.5)
nRBC: 0 % (ref 0.0–0.2)

## 2018-11-02 LAB — I-STAT CHEM 8, ED
BUN: 21 mg/dL (ref 8–23)
Calcium, Ion: 1.26 mmol/L (ref 1.15–1.40)
Chloride: 101 mmol/L (ref 98–111)
Creatinine, Ser: 0.8 mg/dL (ref 0.44–1.00)
Glucose, Bld: 130 mg/dL — ABNORMAL HIGH (ref 70–99)
HCT: 37 % (ref 36.0–46.0)
Hemoglobin: 12.6 g/dL (ref 12.0–15.0)
Potassium: 3.6 mmol/L (ref 3.5–5.1)
Sodium: 141 mmol/L (ref 135–145)
TCO2: 32 mmol/L (ref 22–32)

## 2018-11-02 LAB — TROPONIN I (HIGH SENSITIVITY): Troponin I (High Sensitivity): 15 ng/L (ref <18)

## 2018-11-02 LAB — BRAIN NATRIURETIC PEPTIDE: B Natriuretic Peptide: 41.9 pg/mL (ref 0.0–100.0)

## 2018-11-02 MED ORDER — ALBUTEROL SULFATE HFA 108 (90 BASE) MCG/ACT IN AERS
2.0000 | INHALATION_SPRAY | Freq: Once | RESPIRATORY_TRACT | Status: AC
  Administered 2018-11-02: 2 via RESPIRATORY_TRACT
  Filled 2018-11-02: qty 6.7

## 2018-11-02 MED ORDER — IOHEXOL 350 MG/ML SOLN
75.0000 mL | Freq: Once | INTRAVENOUS | Status: AC | PRN
  Administered 2018-11-02: 75 mL via INTRAVENOUS

## 2018-11-02 NOTE — ED Provider Notes (Signed)
11:13 PM Assumed care from Dr. Darl Householder, please see their note for full history, physical and decision making until this point. In brief this is a 73 y.o. year old female who presented to the ED tonight with Shortness of Breath and Chest Pain     Dyspnea, no increased o2 requirement. No increased cough or fever. Clear lungs, clear xray, pending cta and dispo accordingly.   CT ok. troponins ok. Ambulated in room without difficulty or hypoxia. Dc w/ pcp follow up, return precautions provided.  Discharge instructions, including strict return precautions for new or worsening symptoms, given. Patient and/or family verbalized understanding and agreement with the plan as described.   Labs, studies and imaging reviewed by myself and considered in medical decision making if ordered. Imaging interpreted by radiology.  Labs Reviewed  COMPREHENSIVE METABOLIC PANEL - Abnormal; Notable for the following components:      Result Value   Glucose, Bld 139 (*)    Total Protein 5.9 (*)    All other components within normal limits  I-STAT CHEM 8, ED - Abnormal; Notable for the following components:   Glucose, Bld 130 (*)    All other components within normal limits  SARS CORONAVIRUS 2 (HOSPITAL ORDER, Ong LAB)  CBC WITH DIFFERENTIAL/PLATELET  BRAIN NATRIURETIC PEPTIDE  TROPONIN I (HIGH SENSITIVITY)  TROPONIN I (HIGH SENSITIVITY)    DG Chest Port 1 View  Final Result    CT Angio Chest PE W and/or Wo Contrast    (Results Pending)    No follow-ups on file.    Toiya Morrish, Corene Cornea, MD 11/04/18 [phone]

## 2018-11-02 NOTE — ED Triage Notes (Signed)
Per [facility], pt from home w/ a c/o SOB and CP that began at 1600 today. Pt noetd to be tachypneic and diaphoretic. No N/V. Lung sounds clear and equal. Pt able to ambulate on scene with assistance.   128/84 HR 108 CBG 178

## 2018-11-02 NOTE — ED Provider Notes (Signed)
[facility] EMERGENCY DEPARTMENT Provider Note   CSN: [id number] Arrival date & time: 11/02/18  2038     History   Chief Complaint Chief Complaint  Patient presents with  . Shortness of Breath  . Chest Pain    HPI Lindsey Rowe is a 73 y.o. female history of bipolar, reflux, hypertension, hyperlipidemia, COPD here presenting with shortness of breath.  Patient has been having shortness of breath that is acute onset since about 4 PM today.  Lindsey Rowe states that Lindsey Rowe is unable to catch her breath.  Lindsey Rowe recently was admitted for COPD exacerbation but denies any fevers or chills or cough.  Lindsey Rowe has a known aortic aneurysm that was stable on a CTA about a month ago.  Denies being on blood thinners or history of blood clots. Patient is on 2 L [place] at baseline.      The history is provided by the patient.    Past Medical History:  Diagnosis Date  . Arthritis   . Back pain   . Bipolar 1 disorder ([place])   . Complication of anesthesia    hard to wake up anesthesia  . Depression   . GERD (gastroesophageal reflux disease)   . Headache(784.0)    migraines and tension headaches  . Hyperlipidemia   . Hypertension   . Hypothyroidism   . Thyroid disease     Patient Active Problem List   Diagnosis Date Noted  . COPD exacerbation ([place]) 10/08/2018  . Asthmatic bronchitis 12/02/2017  . Acute respiratory failure with hypoxia ([place]) 06/27/2017  . Diastolic dysfunction 29/52/8413  . Acute lower UTI 06/27/2017  . Acute metabolic encephalopathy 24/40/1027  . Altered mental state 06/10/2017  . Dyspnea and respiratory abnormalities 08/29/2016  . Hypoxemia 08/29/2016  . Shoulder dislocation, right, initial encounter 05/24/2016  . 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 Hyperglycemia 05/24/2016  . Anterior dislocation of right shoulder   . Syncope 03/24/2016  . Generalized weakness 09/24/2015  . Leg pain, bilateral 09/24/2015  . Depression 09/24/2015  . Loss of appetite  09/24/2015  . Leg weakness, bilateral   . Hypothyroid 12/28/2012  . Hypovolemia 12/28/2012  . Hypotension 08/13/2012  . Dehydration 08/13/2012  . AKI (acute kidney injury) ([place]) 08/13/2012  . Hypoventilation associated with obesity syndrome ([place]) 08/13/2012  . Acute respiratory failure ([place]) 07/26/2012  . Chest pain 07/26/2012  . ANKLE SPRAIN, RIGHT 03/16/2010  . DIVERTICULOSIS, COLON 10/31/2009  . SKIN LESION 09/23/2009  . Shoulder pain, right 07/05/2009  . COUGH 03/29/2009  . Migraine 03/11/2009  . OTITIS MEDIA, RIGHT 12/13/2008  . BRUISE 12/06/2008  . ACID REFLUX DISEASE 09/13/2008  . ALLERGIC RHINITIS 05/20/2008  . UNSPECIFIED DISEASE OF HAIR AND HAIR FOLLICLES 25/36/6440  . DERMATITIS 11/12/2007  . OBESITY 07/18/2007  . HYPERCHOLESTEROLEMIA 05/13/2007  . BIPOLAR AFFECTIVE DISORDER 05/12/2007  . HYPERTENSION, BENIGN ESSENTIAL 05/12/2007  . Low back pain potentially associated with radiculopathy 05/12/2007    Past Surgical History:  Procedure Laterality Date  . APPENDECTOMY    . BACK SURGERY    . EYE SURGERY Bilateral    lens implant  . GASTRIC BYPASS    . LOOP RECORDER INSERTION N/A 05/25/2016   Procedure: Loop Recorder Insertion;  Surgeon: Will Meredith Leeds, MD;  Location: [facility];  Service: Cardiovascular;  Laterality: N/A;  . LUMBAR LAMINECTOMY/DECOMPRESSION MICRODISCECTOMY Right 07/23/2012   Procedure: LUMBAR LAMINECTOMY/DECOMPRESSION MICRODISCECTOMY 2 LEVELS;  Surgeon: Floyce Stakes, MD;  Location: [facility];  Service: Neurosurgery;  Laterality:  Right;  Right Lumbar three-four  lumbar four-five Laminectomy/Foraminotomy  . NASAL SINUS SURGERY Bilateral   . TONSILLECTOMY    . WRIST SURGERY       OB History   No obstetric history on file.      Home Medications    Prior to Admission medications   Medication Sig Start Date End Date Taking? Authorizing Provider  albuterol (ACCUNEB) 1.25 MG/3ML nebulizer solution Take 3 mLs (1.25 mg total) by  nebulization every 6 (six) hours as needed for up to 5 days for wheezing. 10/09/18 10/14/18  Margie Ege A, DO  albuterol (PROVENTIL HFA;VENTOLIN HFA) 108 (90 Base) MCG/ACT inhaler Inhale 2 puffs into the lungs every 6 (six) hours as needed for wheezing or shortness of breath. 04/07/18   Charlynne Pander, MD  Carboxymethylcellulose Sodium (EYE DROPS OP) Apply 1 drop to eye daily as needed (dry eyes).    [provider]  DULoxetine (CYMBALTA) 30 MG capsule Take 1 capsule (30 mg total) by mouth 2 (two) times daily. Patient taking differently: Take 30-60 mg by mouth See admin instructions. 30 MG in the am and 60 MG at night 05/26/16   Pearson Grippe, MD  fluticasone [facility]) 50 MCG/ACT nasal spray Place 1 spray into both nostrils daily as needed for allergies or rhinitis. 06/29/17   Rai, Delene Ruffini, MD  levothyroxine (SYNTHROID, LEVOTHROID) 75 MCG tablet Take 75 mcg by mouth daily before breakfast.  09/06/15   [provider]  lisinopril (PRINIVIL,ZESTRIL) 10 MG tablet Take 10 mg by mouth daily.  08/15/16   [provider]  loperamide (IMODIUM) 2 MG capsule Take 1 capsule (2 mg total) by mouth 3 (three) times daily as needed for diarrhea or loose stools. 07/02/17   Rai, Ripudeep Kirtland Bouchard, MD  meloxicam (MOBIC) 15 MG tablet Take 15 mg by mouth daily as needed for pain.    [provider]  metoprolol succinate (TOPROL-XL) 50 MG 24 hr tablet Take 1 tablet (50 mg total) by mouth daily. Take with or immediately following a meal. 05/26/18   Camnitz, Andree Coss, MD  omeprazole (PRILOSEC) 20 MG capsule Take 20 mg by mouth 2 (two) times daily before a meal.    [provider]  OVER THE COUNTER MEDICATION Place 1 application into both eyes 2 (two) times daily as needed (dry eyes). Eye Gel    [provider]  pravastatin (PRAVACHOL) 40 MG tablet Take 40 mg by mouth daily.    [provider]  pregabalin (LYRICA) 100 MG capsule Take 1 capsule (100 mg total) by mouth 2 (two)  times daily. 09/27/15   Joseph Art, DO  PROLIA 60 MG/ML SOSY injection Inject 60 mg into the skin every 6 (six) months.  01/09/18   [provider]  RESTASIS 0.05 % ophthalmic emulsion Place 1 drop into both eyes 2 (two) times daily.  02/19/18   [provider]  vitamin B-12 (CYANOCOBALAMIN) 1000 MCG tablet Take 1,000 mcg by mouth daily.    [provider]  Vitamin D, Ergocalciferol, (DRISDOL) 1.25 MG (50000 UT) CAPS capsule Take 50,000 Units by mouth once a week. 03/03/18   [provider]    Family History Family History  Problem Relation Age of Onset  . Heart disease Mother   . Emphysema Father   . Diabetes Sister   . Other Brother        Brain tumor - s/p excision    Social History Social History   Tobacco Use  .  Smoking status: Never Smoker  . Smokeless tobacco: Never Used  Substance Use Topics  . Alcohol use: Yes    Comment: social  . Drug use: No    Comment: Pt + opioids as prescribed     Allergies   Gabapentin and Cefadroxil   Review of Systems Review of Systems  Respiratory: Positive for shortness of breath.   All other systems reviewed and are negative.    Physical Exam Updated Vital Signs BP 137/80 (BP Location: Right Arm)   Pulse (!) 114   Temp 98.3 F (36.8 C) (Oral)   Resp 20   SpO2 96%   Physical Exam Vitals signs and nursing note reviewed.  HENT:     Head: Normocephalic.  Eyes:     Pupils: Pupils are equal, round, and reactive to light.  Neck:     Musculoskeletal: Normal range of motion.  Cardiovascular:     Rate and Rhythm: Regular rhythm. Tachycardia present.  Pulmonary:     Comments: Tachypneic, no wheezing or crackles  Musculoskeletal: Normal range of motion.     Comments: 1+ edema bilaterally   Skin:    General: Skin is warm.     Capillary Refill: Capillary refill takes less than 2 seconds.  Neurological:     General: No focal deficit present.     Mental Status: Lindsey Rowe is alert and oriented to  person, place, and time.  Psychiatric:        Mood and Affect: Mood normal.        Behavior: Behavior normal.      ED Treatments / Results  Labs (all labs ordered are listed, but only abnormal results are displayed) Labs Reviewed  CBC WITH DIFFERENTIAL/PLATELET  COMPREHENSIVE METABOLIC PANEL  BRAIN NATRIURETIC PEPTIDE  I-STAT CHEM 8, ED  TROPONIN I (HIGH SENSITIVITY)    EKG EKG Interpretation  Date/Time:  Sunday November 02 2018 20:43:31 EDT Ventricular Rate:  115 PR Interval:    QRS Duration: 94 QT Interval:  306 QTC Calculation: 424 R Axis:   17 Text Interpretation:  Sinus tachycardia Borderline low voltage, extremity leads Since last tracing rate faster Confirmed by Richardean Canal [phone]) on 11/02/2018 9:00:22 PM   Radiology Dg Chest Port 1 View  Result Date: 11/02/2018 CLINICAL DATA:  Shortness of breath the and at 4 p.m. EXAM: PORTABLE CHEST 1 VIEW COMPARISON:  CT 10/08/2018, radiograph 10/07/2018 FINDINGS: Lung volumes are markedly diminished. There is chronic elevation of the right hemidiaphragm, similar to comparison studies. Basilar atelectatic changes are noted. More gradient opacity in the left lung base likely reflects a combination of body habitus and abundant pericardial fat seen on prior CT. No focal consolidative process is seen. No pneumothorax or visible effusion. Cardiomediastinal contours are stable from prior. Degenerative changes are present in the imaged spine and shoulders. No acute osseous or soft tissue abnormality. IMPRESSION: Accounting for basilar atelectasis and body habitus, the lungs are clear Electronically Signed   By: Kreg Shropshire M.D.   On: 11/02/2018 21:14    Procedures Procedures (including critical care time)  Angiocath insertion Performed by: Richardean Canal  Consent: Verbal consent obtained. Risks and benefits: risks, benefits and alternatives were discussed Time out: Immediately prior to procedure a "time out" was called to verify the  correct patient, procedure, equipment, support staff and site/side marked as required.  Preparation: Patient was prepped and draped in the usual sterile fashion.  Vein Location: R forearm  Ultrasound Guided  Gauge: 20 long   Normal blood  return and flush without difficulty Patient tolerance: Patient tolerated the procedure well with no immediate complications.     Medications Ordered in ED Medications - No data to display   Initial Impression / Assessment and Plan / ED Course  I have reviewed the triage vital signs and the nursing notes.  Pertinent labs & imaging results that were available during my care of the patient were reviewed by me and considered in my medical decision making (see chart for details).       Karna DupesDeborah C Gordy is a 73 y.o. female here with SOB, tachycardia. Hx of aortic aneurysm. Consider dissection vs aneurysm rupture vs PE. No wheezing to suggest COPD exacerbation. Will get labs, BNP, CT angio chest.   11:17 PM CTA chest pending. COVID pending. O2 100% on 2 L [place]. CTA pending. If patient able to ambulate on home O2, anticipate discharge. Signed out to Dr. Erin HearingMessner.    Final Clinical Impressions(s) / ED Diagnoses   Final diagnoses:  None    ED Discharge Orders    None       Charlynne PanderYao, Correll Denbow Hsienta, MD 11/02/18 2318

## 2018-11-02 NOTE — ED Notes (Signed)
Patient transported to CT 

## 2018-11-03 ENCOUNTER — Telehealth: Payer: Self-pay | Admitting: Student

## 2018-11-03 DIAGNOSIS — M2550 Pain in unspecified joint: Secondary | ICD-10-CM | POA: Diagnosis not present

## 2018-11-03 DIAGNOSIS — R5381 Other malaise: Secondary | ICD-10-CM | POA: Diagnosis not present

## 2018-11-03 DIAGNOSIS — Z7401 Bed confinement status: Secondary | ICD-10-CM | POA: Diagnosis not present

## 2018-11-03 DIAGNOSIS — R0602 Shortness of breath: Secondary | ICD-10-CM | POA: Diagnosis not present

## 2018-11-03 LAB — SARS CORONAVIRUS 2 BY RT PCR (HOSPITAL ORDER, PERFORMED IN ~~LOC~~ HOSPITAL LAB): SARS Coronavirus 2: NEGATIVE

## 2018-11-03 LAB — TROPONIN I (HIGH SENSITIVITY): Troponin I (High Sensitivity): 18 ng/L — ABNORMAL HIGH (ref <18)

## 2018-11-03 NOTE — ED Notes (Signed)
Patient verbalizes understanding of discharge instructions. Opportunity for questioning and answers were provided. Armband removed by staff, pt discharged from ED.  

## 2018-11-03 NOTE — ED Notes (Signed)
[facility] has been called  

## 2018-11-03 NOTE — ED Notes (Signed)
This Tech ambulated Pt in room with assistance. Pt's pulse ox > 98% on 2L.

## 2018-11-03 NOTE — Telephone Encounter (Signed)
Reviewed carelink alert from 11/02/2018 with patient.   She set manual transmission in the setting of SOB.  Presenting showed sinus tach at 140 bpm.     She was seen in the ED later that evening for work up, which was unrevealing.    Pt knows to call back with any recurrent symptoms. Can consider increasing Toprol if needed.   Legrand Como [address]" [place], PA-C  11/03/2018 1:46 PM

## 2018-11-05 ENCOUNTER — Other Ambulatory Visit: Payer: Medicare HMO

## 2018-11-05 ENCOUNTER — Ambulatory Visit: Payer: Medicare HMO | Admitting: Pulmonary Disease

## 2018-11-05 DIAGNOSIS — J9601 Acute respiratory failure with hypoxia: Secondary | ICD-10-CM | POA: Diagnosis not present

## 2018-11-05 DIAGNOSIS — R0902 Hypoxemia: Secondary | ICD-10-CM | POA: Diagnosis not present

## 2018-11-05 DIAGNOSIS — J45909 Unspecified asthma, uncomplicated: Secondary | ICD-10-CM | POA: Diagnosis not present

## 2018-11-06 ENCOUNTER — Ambulatory Visit (INDEPENDENT_AMBULATORY_CARE_PROVIDER_SITE_OTHER): Payer: Medicare HMO | Admitting: *Deleted

## 2018-11-06 DIAGNOSIS — R55 Syncope and collapse: Secondary | ICD-10-CM | POA: Diagnosis not present

## 2018-11-06 LAB — CUP PACEART REMOTE DEVICE CHECK
Date Time Interrogation Session: 20201001161153
Implantable Pulse Generator Implant Date: 20180420

## 2018-11-08 DIAGNOSIS — J45909 Unspecified asthma, uncomplicated: Secondary | ICD-10-CM | POA: Diagnosis not present

## 2018-11-08 LAB — CUP PACEART REMOTE DEVICE CHECK
Date Time Interrogation Session: 20201002014210
Implantable Pulse Generator Implant Date: 20180420

## 2018-11-11 ENCOUNTER — Other Ambulatory Visit: Payer: Medicare HMO

## 2018-11-13 NOTE — Progress Notes (Signed)
Carelink Summary Report / Loop Recorder 

## 2018-11-16 ENCOUNTER — Encounter (HOSPITAL_COMMUNITY): Payer: Self-pay

## 2018-11-16 ENCOUNTER — Other Ambulatory Visit: Payer: Self-pay

## 2018-11-16 ENCOUNTER — Emergency Department (HOSPITAL_COMMUNITY): Payer: Medicare HMO

## 2018-11-16 DIAGNOSIS — R0602 Shortness of breath: Secondary | ICD-10-CM | POA: Diagnosis not present

## 2018-11-16 DIAGNOSIS — I10 Essential (primary) hypertension: Secondary | ICD-10-CM | POA: Insufficient documentation

## 2018-11-16 DIAGNOSIS — R0600 Dyspnea, unspecified: Secondary | ICD-10-CM | POA: Diagnosis not present

## 2018-11-16 DIAGNOSIS — J189 Pneumonia, unspecified organism: Secondary | ICD-10-CM | POA: Diagnosis not present

## 2018-11-16 DIAGNOSIS — Z20828 Contact with and (suspected) exposure to other viral communicable diseases: Secondary | ICD-10-CM | POA: Insufficient documentation

## 2018-11-16 DIAGNOSIS — Z79899 Other long term (current) drug therapy: Secondary | ICD-10-CM | POA: Diagnosis not present

## 2018-11-16 DIAGNOSIS — E039 Hypothyroidism, unspecified: Secondary | ICD-10-CM | POA: Insufficient documentation

## 2018-11-16 DIAGNOSIS — R0789 Other chest pain: Secondary | ICD-10-CM | POA: Diagnosis not present

## 2018-11-16 DIAGNOSIS — R069 Unspecified abnormalities of breathing: Secondary | ICD-10-CM | POA: Diagnosis not present

## 2018-11-16 DIAGNOSIS — J181 Lobar pneumonia, unspecified organism: Secondary | ICD-10-CM | POA: Insufficient documentation

## 2018-11-16 DIAGNOSIS — J449 Chronic obstructive pulmonary disease, unspecified: Secondary | ICD-10-CM | POA: Insufficient documentation

## 2018-11-16 DIAGNOSIS — R457 State of emotional shock and stress, unspecified: Secondary | ICD-10-CM | POA: Diagnosis not present

## 2018-11-16 NOTE — ED Triage Notes (Signed)
Pt arrived from home with shortness of breath states she took two breathing treatments prior to arrival. Pt on 2L O2 normally but is unsure of causing medical history. Pt reports feeling some depression due to lack of visitors at home. Denies any SI, just reports feeling lonely.

## 2018-11-16 NOTE — ED Notes (Signed)
Pt calling out requesting someone come to her room, this nurse arrived and patient upset about the comfort of the wheelchair. This nurse offered to assist patient into chair in triage room or to a cushioned chair in the lobby. Pt refused asking for a bed. This nurse explained again that we are unable to take her back to a room right now due to the wait, pt stating she has had multiple back surgeries in the past and needs to be somewhere more comfortable. Again this nurse explained the options we have available and patient requesting to call [facility] to go home rather than waiting, blanket placed behind patient for comfort.

## 2018-11-16 NOTE — ED Notes (Signed)
Pt transported to x-ray and back to triage 1.

## 2018-11-16 NOTE — ED Notes (Signed)
[facility] refusing to transport patient if leaving AMA

## 2018-11-16 NOTE — ED Notes (Signed)
Pt asked this nurse to leave the door open, this nurse explained due to Covid-19 we need to keep the door closed to keep her safe as patient will not wear mask. Pt states that the door has been left open since she has been here, this nurse explained that she has been up here with [place] and the door has been closed. Pt began to cry and yell to just close it. This nurse apologized for upsetting her but explained that we are just trying to keep her safe. Pt states she does not like the door closed because she does not want to be alone and that if nobody is in the room with her she may as well be at home in her own bed alone. Pt stating this nurse needs to call [facility] for her to go home.

## 2018-11-17 ENCOUNTER — Encounter (HOSPITAL_COMMUNITY): Payer: Self-pay

## 2018-11-17 ENCOUNTER — Emergency Department (HOSPITAL_COMMUNITY)
Admission: EM | Admit: 2018-11-17 | Discharge: 2018-11-17 | Disposition: A | Discharge status: Home / Self Care | Payer: Medicare HMO | Attending: Emergency Medicine | Admitting: Emergency Medicine

## 2018-11-17 ENCOUNTER — Other Ambulatory Visit: Payer: Medicare HMO

## 2018-11-17 ENCOUNTER — Emergency Department (HOSPITAL_COMMUNITY): Payer: Medicare HMO

## 2018-11-17 DIAGNOSIS — R0600 Dyspnea, unspecified: Secondary | ICD-10-CM | POA: Diagnosis not present

## 2018-11-17 DIAGNOSIS — Z7401 Bed confinement status: Secondary | ICD-10-CM | POA: Diagnosis not present

## 2018-11-17 DIAGNOSIS — R5381 Other malaise: Secondary | ICD-10-CM | POA: Diagnosis not present

## 2018-11-17 DIAGNOSIS — R0602 Shortness of breath: Secondary | ICD-10-CM | POA: Diagnosis not present

## 2018-11-17 DIAGNOSIS — M2550 Pain in unspecified joint: Secondary | ICD-10-CM | POA: Diagnosis not present

## 2018-11-17 DIAGNOSIS — R52 Pain, unspecified: Secondary | ICD-10-CM | POA: Diagnosis not present

## 2018-11-17 DIAGNOSIS — J189 Pneumonia, unspecified organism: Secondary | ICD-10-CM

## 2018-11-17 LAB — TROPONIN I (HIGH SENSITIVITY)
Troponin I (High Sensitivity): 9 ng/L (ref <18)
Troponin I (High Sensitivity): 11 ng/L (ref <18)

## 2018-11-17 LAB — CBC WITH DIFFERENTIAL/PLATELET
Abs Immature Granulocytes: 0.02 10*3/uL (ref 0.00–0.07)
Basophils Absolute: 0 10*3/uL (ref 0.0–0.1)
Basophils Relative: 0 %
Eosinophils Absolute: 0.2 10*3/uL (ref 0.0–0.5)
Eosinophils Relative: 2 %
HCT: 40.9 % (ref 36.0–46.0)
Hemoglobin: 12.8 g/dL (ref 12.0–15.0)
Immature Granulocytes: 0 %
Lymphocytes Relative: 26 %
Lymphs Abs: 2 10*3/uL (ref 0.7–4.0)
MCH: 31.4 pg (ref 26.0–34.0)
MCHC: 31.3 g/dL (ref 30.0–36.0)
MCV: 100.2 fL — ABNORMAL HIGH (ref 80.0–100.0)
Monocytes Absolute: 0.6 10*3/uL (ref 0.1–1.0)
Monocytes Relative: 8 %
Neutro Abs: 4.8 10*3/uL (ref 1.7–7.7)
Neutrophils Relative %: 64 %
Platelets: 274 10*3/uL (ref 150–400)
RBC: 4.08 MIL/uL (ref 3.87–5.11)
RDW: 14.6 % (ref 11.5–15.5)
WBC: 7.6 10*3/uL (ref 4.0–10.5)
nRBC: 0 % (ref 0.0–0.2)

## 2018-11-17 LAB — BASIC METABOLIC PANEL
Anion gap: 12 (ref 5–15)
BUN: 13 mg/dL (ref 8–23)
CO2: 25 mmol/L (ref 22–32)
Calcium: 10.2 mg/dL (ref 8.9–10.3)
Chloride: 104 mmol/L (ref 98–111)
Creatinine, Ser: 0.78 mg/dL (ref 0.44–1.00)
GFR calc Af Amer: >60 mL/min (ref >60)
GFR calc non Af Amer: >60 mL/min (ref >60)
Glucose, Bld: 96 mg/dL (ref 70–99)
Potassium: 4.3 mmol/L (ref 3.5–5.1)
Sodium: 141 mmol/L (ref 135–145)

## 2018-11-17 LAB — D-DIMER, QUANTITATIVE: D-Dimer, Quant: 1.02 ug/mL-FEU — ABNORMAL HIGH (ref 0.00–0.50)

## 2018-11-17 MED ORDER — DOXYCYCLINE HYCLATE 100 MG PO TABS
100.0000 mg | ORAL_TABLET | Freq: Once | ORAL | Status: AC
  Administered 2018-11-17: 100 mg via ORAL
  Filled 2018-11-17: qty 1

## 2018-11-17 MED ORDER — LORAZEPAM 2 MG/ML IJ SOLN
0.5000 mg | Freq: Once | INTRAMUSCULAR | Status: AC
  Administered 2018-11-17: 0.5 mg via INTRAVENOUS
  Filled 2018-11-17: qty 1

## 2018-11-17 MED ORDER — DOXYCYCLINE HYCLATE 100 MG PO CAPS: 100.0000 mg | ORAL_CAPSULE | Freq: Two times a day (BID) | ORAL | 0 refills | Status: DC

## 2018-11-17 MED ORDER — ALBUTEROL SULFATE HFA 108 (90 BASE) MCG/ACT IN AERS
4.0000 | INHALATION_SPRAY | Freq: Once | RESPIRATORY_TRACT | Status: AC
  Administered 2018-11-17: 4 via RESPIRATORY_TRACT
  Filled 2018-11-17: qty 6.7

## 2018-11-17 MED ORDER — IOHEXOL 350 MG/ML SOLN
100.0000 mL | Freq: Once | INTRAVENOUS | Status: AC | PRN
  Administered 2018-11-17: 100 mL via INTRAVENOUS

## 2018-11-17 NOTE — Discharge Instructions (Signed)
Your CT scan today shows a stable aneurysm in your chest.  There is possible pneumonia in your right lung.  Take antibiotics as prescribed.  Keep yourself quarantined while the coronavirus test is pending.  Return to the ED with new or worsening symptoms

## 2018-11-17 NOTE — ED Notes (Signed)
Pt resting and ok to wait for a treatment room in main ED and to see a physician. Pt no longer wanting to leave AMA.

## 2018-11-17 NOTE — ED Notes (Signed)
Pt in ct 

## 2018-11-17 NOTE — ED Notes (Signed)
Pt asleep in Triage 1 will continue to monitor.

## 2018-11-17 NOTE — ED Notes (Addendum)
Pt assisted to the restroom with no difficulty

## 2018-11-17 NOTE — ED Notes (Signed)
Pt requesting for [facility] to be called because no one will keep her door open, and she does not want to be left alone. Pt also does not want to "sit in this room for 4 or more hours waiting for a physician to see her". Writer explained to pt she was in triage waiting to be moved to a treatment room and apologized for the wait. Writer attempted to make pt more comfortable while waiting by providing a pillow for her back and offering her warm blankets. Pt happy with pillow and blanket, but still requesting [facility] for transport home.

## 2018-11-17 NOTE — ED Notes (Signed)
Pt stating that she is no longer short of breath, will call [facility] per request

## 2018-11-17 NOTE — ED Notes (Signed)
Pt able to transfer from chair to bed with minimal assist.

## 2018-11-17 NOTE — ED Notes (Signed)
This writer attempted to assist patient with putting oxygen back on. Pt took oxygen back off, and then put it on and stated "I don't wear it like that. I know how to do it" very aggressively

## 2018-11-17 NOTE — ED Notes (Signed)
This Probation officer was assisting patient to the bathroom. This writer instructed patient that she had to wear her mask and keep her mask over her nose through the hallway and in the room to keep her safe. The pt responded "I know, do not talk to me like I am stupid. I want to go home"

## 2018-11-17 NOTE — ED Notes (Signed)
Pt resting comfortably. Pt has call bell within reach.

## 2018-11-17 NOTE — ED Notes (Signed)
[facility] notified of need for transport of pt at discharge.

## 2018-11-17 NOTE — ED Provider Notes (Signed)
[facility]-EMERGENCY DEPT Provider Note   CSN: [id number] Arrival date & time: 11/16/18  2101     History   Chief Complaint Chief Complaint  Patient presents with   Shortness of Breath    HPI Lindsey Rowe is a 73 y.o. female.     Patient with history of COPD on home oxygen, bipolar disorder, acid reflux disease and hypertension presenting from home with shortness of breath.  States she developed shortness of breath several hours ago while lying in bed at home.  She feels improved after doing 2 nebulizer treatments at home.  She states she is not been sleeping well for the past week due to feeling anxious and depressed.  She denies any suicidal thoughts or homicidal thoughts or hallucinations.  She denies any chest pain except when she coughs.  She was admitted about 1 month ago for bronchitis and was found to have a thoracic aortic aneurysm that was stable on CT scan on September 29.  She states she is breathing better at this time and believes her shortness of breath is due to anxiety.  She used her nebulizer twice at home.  She has nonproductive cough and congestion.  No fever.  No leg pain or leg swelling.  No abdominal pain.  States she is not taking anything at home for her anxiety.  The history is provided by the patient.    Past Medical History:  Diagnosis Date   Arthritis    Back pain    Bipolar 1 disorder (HCC)    Complication of anesthesia    hard to wake up anesthesia   Depression    GERD (gastroesophageal reflux disease)    Headache(784.0)    migraines and tension headaches   Hyperlipidemia    Hypertension    Hypothyroidism    Thyroid disease     Patient Active Problem List   Diagnosis Date Noted   COPD exacerbation (HCC) 10/08/2018   Asthmatic bronchitis 12/02/2017   Acute respiratory failure with hypoxia (HCC) 06/27/2017   Diastolic dysfunction 06/27/2017   Acute lower UTI 06/27/2017   Acute metabolic  encephalopathy 06/27/2017   Altered mental state 06/10/2017   Dyspnea and respiratory abnormalities 08/29/2016   Hypoxemia 08/29/2016   Shoulder dislocation, right, initial encounter 05/24/2016   Hill Sachs deformity, 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   Hyperglycemia 05/24/2016   Anterior dislocation of right shoulder    Syncope 03/24/2016   Generalized weakness 09/24/2015   Leg pain, bilateral 09/24/2015   Depression 09/24/2015   Loss of appetite 09/24/2015   Leg weakness, bilateral    Hypothyroid 12/28/2012   Hypovolemia 12/28/2012   Hypotension 08/13/2012   Dehydration 08/13/2012   AKI (acute kidney injury) (HCC) 08/13/2012   Hypoventilation associated with obesity syndrome (HCC) 08/13/2012   Acute respiratory failure (HCC) 07/26/2012   Chest pain 07/26/2012   ANKLE SPRAIN, RIGHT 03/16/2010   DIVERTICULOSIS, COLON 10/31/2009   SKIN LESION 09/23/2009   Shoulder pain, right 07/05/2009   COUGH 03/29/2009   Migraine 03/11/2009   OTITIS MEDIA, RIGHT 12/13/2008   BRUISE 12/06/2008   ACID REFLUX DISEASE 09/13/2008   ALLERGIC RHINITIS 05/20/2008   UNSPECIFIED DISEASE OF HAIR AND HAIR FOLLICLES 05/20/2008   DERMATITIS 11/12/2007   OBESITY 07/18/2007   HYPERCHOLESTEROLEMIA 05/13/2007   BIPOLAR AFFECTIVE DISORDER 05/12/2007   HYPERTENSION, BENIGN ESSENTIAL 05/12/2007   Low back pain potentially associated with radiculopathy 05/12/2007    Past Surgical History:  Procedure Laterality Date   APPENDECTOMY     BACK SURGERY  EYE SURGERY Bilateral    lens implant   GASTRIC BYPASS     LOOP RECORDER INSERTION N/A 05/25/2016   Procedure: Loop Recorder Insertion;  Surgeon: Will Meredith Leeds, MD;  Location: [place] [facility];  Service: Cardiovascular;  Laterality: N/A;   LUMBAR LAMINECTOMY/DECOMPRESSION MICRODISCECTOMY Right 07/23/2012   Procedure: LUMBAR LAMINECTOMY/DECOMPRESSION MICRODISCECTOMY 2 LEVELS;  Surgeon: Floyce Stakes, MD;  Location: [place]  [facility];  Service: Neurosurgery;  Laterality: Right;  Right Lumbar three-four  lumbar four-five Laminectomy/Foraminotomy   NASAL SINUS SURGERY Bilateral    TONSILLECTOMY     WRIST SURGERY       OB History   No obstetric history on file.      Home Medications    Prior to Admission medications   Medication Sig Start Date End Date Taking? Authorizing Provider  albuterol (ACCUNEB) 1.25 MG/3ML nebulizer solution Take 3 mLs (1.25 mg total) by nebulization every 6 (six) hours as needed for up to 5 days for wheezing. 10/09/18 10/14/18  Cherylann Ratel A, DO  albuterol (PROVENTIL HFA;VENTOLIN HFA) 108 (90 Base) MCG/ACT inhaler Inhale 2 puffs into the lungs every 6 (six) hours as needed for wheezing or shortness of breath. 04/07/18   Drenda Freeze, MD  Carboxymethylcellulose Sodium (EYE DROPS OP) Apply 1 drop to eye daily as needed (dry eyes).    [provider]  DULoxetine (CYMBALTA) 30 MG capsule Take 1 capsule (30 mg total) by mouth 2 (two) times daily. Patient taking differently: Take 30-60 mg by mouth See admin instructions. 30 MG in the am and 60 MG at night 05/26/16   Jani Gravel, MD  fluticasone [facility]) 50 MCG/ACT nasal spray Place 1 spray into both nostrils daily as needed for allergies or rhinitis. 06/29/17   Rai, Vernelle Emerald, MD  levothyroxine (SYNTHROID, LEVOTHROID) 75 MCG tablet Take 75 mcg by mouth daily before breakfast.  09/06/15   [provider]  lisinopril (PRINIVIL,ZESTRIL) 10 MG tablet Take 10 mg by mouth daily.  08/15/16   [provider]  loperamide (IMODIUM) 2 MG capsule Take 1 capsule (2 mg total) by mouth 3 (three) times daily as needed for diarrhea or loose stools. 07/02/17   Rai, Ripudeep Raliegh Ip, MD  meloxicam (MOBIC) 15 MG tablet Take 15 mg by mouth daily as needed for pain.    [provider]  metoprolol succinate (TOPROL-XL) 50 MG 24 hr tablet Take 1 tablet (50 mg total) by mouth daily. Take with or immediately following a meal. 05/26/18    Camnitz, Ocie Doyne, MD  omeprazole (PRILOSEC) 20 MG capsule Take 20 mg by mouth 2 (two) times daily before a meal.    [provider]  OVER THE COUNTER MEDICATION Place 1 application into both eyes 2 (two) times daily as needed (dry eyes). Eye Gel    [provider]  pravastatin (PRAVACHOL) 40 MG tablet Take 40 mg by mouth daily.    [provider]  pregabalin (LYRICA) 100 MG capsule Take 1 capsule (100 mg total) by mouth 2 (two) times daily. 09/27/15   Geradine Girt, DO  PROLIA 60 MG/ML SOSY injection Inject 60 mg into the skin every 6 (six) months.  01/09/18   [provider]  RESTASIS 0.05 % ophthalmic emulsion Place 1 drop into both eyes 2 (two) times daily.  02/19/18   [provider]  vitamin B-12 (CYANOCOBALAMIN) 1000 MCG tablet Take 1,000 mcg by mouth daily.    [provider]  Vitamin D, Ergocalciferol, (DRISDOL) 1.25 MG (  50000 UT) CAPS capsule Take 50,000 Units by mouth once a week. 03/03/18   [provider]    Family History Family History  Problem Relation Age of Onset   Heart disease Mother    Emphysema Father    Diabetes Sister    Other Brother        Brain tumor - s/p excision    Social History Social History   Tobacco Use   Smoking status: Never Smoker   Smokeless tobacco: Never Used  Substance Use Topics   Alcohol use: Yes    Comment: social   Drug use: No    Comment: Pt + opioids as prescribed     Allergies   Gabapentin and Cefadroxil   Review of Systems Review of Systems  Constitutional: Negative for activity change, appetite change, fatigue and fever.  HENT: Negative for congestion and rhinorrhea.   Eyes: Negative for visual disturbance.  Respiratory: Positive for cough, chest tightness and shortness of breath.   Cardiovascular: Negative for chest pain.  Gastrointestinal: Negative for abdominal pain, nausea and vomiting.  Genitourinary: Negative for dysuria and hematuria.    Musculoskeletal: Negative for arthralgias and myalgias.  Skin: Negative for rash.  Neurological: Negative for dizziness, weakness and headaches.  Psychiatric/Behavioral: Negative for behavioral problems, confusion, decreased concentration, sleep disturbance and suicidal ideas. The patient is nervous/anxious.    all other systems are negative except as noted in the HPI and PMH.     Physical Exam Updated Vital Signs BP 134/75 (BP Location: Left Arm)    Pulse 83    Temp 97.8 F (36.6 C) (Oral)    Resp 17    SpO2 98%   Physical Exam Vitals signs and nursing note reviewed.  Constitutional:      General: She is not in acute distress.    Appearance: She is well-developed. She is obese.  HENT:     Head: Normocephalic and atraumatic.     Mouth/Throat:     Pharynx: No oropharyngeal exudate.  Eyes:     Conjunctiva/sclera: Conjunctivae normal.     Pupils: Pupils are equal, round, and reactive to light.  Neck:     Musculoskeletal: Normal range of motion and neck supple.     Comments: No meningismus. Cardiovascular:     Rate and Rhythm: Normal rate and regular rhythm.     Heart sounds: Normal heart sounds. No murmur.  Pulmonary:     Effort: Pulmonary effort is normal. No respiratory distress.     Breath sounds: Wheezing present.     Comments: Diminished breath sounds with scattered expiratory wheezing Abdominal:     Palpations: Abdomen is soft.     Tenderness: There is no abdominal tenderness. There is no guarding or rebound.  Musculoskeletal: Normal range of motion.        General: No tenderness.     Right lower leg: No edema.     Left lower leg: No edema.  Skin:    General: Skin is warm.     Capillary Refill: Capillary refill takes less than 2 seconds.  Neurological:     General: No focal deficit present.     Mental Status: She is alert and oriented to person, place, and time. Mental status is at baseline.     Cranial Nerves: No cranial nerve deficit.     Motor: No abnormal  muscle tone.     Coordination: Coordination normal.     Comments:  5/5 strength throughout. CN 2-12 intact.Equal grip strength.  Psychiatric:        Behavior: Behavior normal.      ED Treatments / Results  Labs (all labs ordered are listed, but only abnormal results are displayed) Labs Reviewed  CBC WITH DIFFERENTIAL/PLATELET - Abnormal; Notable for the following components:      Result Value   MCV 100.2 (*)    All other components within normal limits  D-DIMER, QUANTITATIVE (NOT AT [facility]) - Abnormal; Notable for the following components:   D-Dimer, Quant 1.02 (*)    All other components within normal limits  SARS CORONAVIRUS 2 (TAT 6-24 HRS)  BASIC METABOLIC PANEL  TROPONIN I (HIGH SENSITIVITY)  TROPONIN I (HIGH SENSITIVITY)    EKG EKG Interpretation  Date/Time:  Sunday November 16 2018 21:45:31 EDT Ventricular Rate:  88 PR Interval:    QRS Duration: 93 QT Interval:  377 QTC Calculation: 457 R Axis:   -38 Text Interpretation:  Sinus rhythm Left axis deviation Low voltage, precordial leads Abnormal R-wave progression, late transition No significant change was found Confirmed by Glynn Octave [phone]) on 11/17/2018 1:34:54 AM   Radiology Dg Chest 2 View  Result Date: 11/16/2018 CLINICAL DATA:  Shortness of breath EXAM: CHEST - 2 VIEW COMPARISON:  11/02/2018, CT 11/03/2018 FINDINGS: Low lung volumes. No consolidation or pleural effusion. Vague opacity in the right mid lung, could be secondary to prior rib fractures. Normal heart size. No pneumothorax. IMPRESSION: No active cardiopulmonary disease. Vague right mid lung opacity, suspect that this is related to rib fractures demonstrated on prior CT. Electronically Signed   By: [name] Pang M.D.   On: 11/16/2018 22:07   Ct Angio Chest Pe W And/or Wo Contrast  Result Date: 11/17/2018 CLINICAL DATA:  Intermediate probability for pulmonary embolism. Shortness of breath EXAM: CT ANGIOGRAPHY CHEST WITH CONTRAST TECHNIQUE:  Multidetector CT imaging of the chest was performed using the standard protocol during bolus administration of intravenous contrast. Multiplanar CT image reconstructions and MIPs were obtained to evaluate the vascular anatomy. CONTRAST:  OMNIPAQUE IOHEXOL 350 MG/ML SOLN COMPARISON:  11/03/2018 FINDINGS: Cardiovascular: Normal heart size. No pericardial effusion. Suboptimal pulmonary artery opacification-the patient was tachypneic and there is limitation due to bolus dispersion and soft tissue attenuation. No evidence of pulmonary embolism with reasonable visualization to the segmental level. Chronic dilated ascending aorta measuring up to 4.2 cm. Negative for aortic dissection. Coronary calcification. Mediastinum/Nodes: Negative for adenopathy or mass. Lungs/Pleura: Patchy ground-glass density in the right lower lobe which is mildly progressed from the most recent prior. This finding is acute or subacute based on more remote CTs of the chest. 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 No edema, effusion, or pneumothorax. Upper Abdomen: Postoperative proximal stomach. Musculoskeletal: Degenerative changes without acute or aggressive finding. There are remote and healed/healing bilateral rib fractures. Advanced glenohumeral osteoarthritis on the right anterior subluxation and ganglion. Advanced lower thoracic disc degeneration. Review of the MIP images confirms the above findings. IMPRESSION: 1. Limited chest CTA with no evidence of pulmonary embolism. 2. Mild patchy airspace opacity in the right lower lobe, please correlate for pneumonia symptoms. 3. Aneurysmal ascending aorta measuring up to 4.2 cm. Recommend annual imaging followup by CTA or MRA. This recommendation follows 2010 ACCF/AHA/AATS/ACR/ASA/SCA/SCAI/SIR/STS/SVM Guidelines for the Diagnosis and Management of Patients with Thoracic Aortic Disease. Circulation. 2010; 121: U045-W098. Aortic aneurysm NOS (ICD10-I71.9) 4.  Tracheobronchomalacia. Electronically Signed   By: Marnee Spring M.D.   On: 11/17/2018 06:57    Procedures Ultrasound ED Peripheral IV (Provider)  Date/Time: 11/17/2018 7:35 AM Performed by:  Glynn Octave, MD Authorized by: Glynn Octave, MD   Procedure details:    Indications: multiple failed IV attempts     Skin Prep: chlorhexidine gluconate     Location:  Left anterior forearm   Angiocath:  20 G   Bedside Ultrasound Guided: Yes     Images: archived     Patient tolerated procedure without complications: Yes     Dressing applied: Yes     (including critical care time)  Medications Ordered in ED Medications - No data to display   Initial Impression / Assessment and Plan / ED Course  I have reviewed the triage vital signs and the nursing notes.  Pertinent labs & imaging results that were available during my care of the patient were reviewed by me and considered in my medical decision making (see chart for details).       Patient from home with shortness of breath that has since improved.  Her EKG is nonischemic.  Chest x-ray is negative with clear lung sounds and no wheezing.  Troponin negative.  D-dimer is elevated and CT scan obtained to rule out pulmonary embolism.  CT negative for PE.  Shows stable thoracic aneurysm which patient is aware of the need for follow-up. Questionable right lower lobe infiltrate but patient denies any fever or cough.  We will treat with doxycycline.  She is in no respiratory distress.  She is stable on her nasal cannula.  Repeat coronavirus test will be sent.  Anticipate discharge home as long as patient is ambulatory without desaturation. Patient agreeable to follow quarantine precautions while coronavirus test is pending.  Dr. Rhunette Croft to assume care at shift change.   Lindsey Rowe was evaluated in Emergency Department on 11/17/2018 for the symptoms described in the history of present illness. She was evaluated in the context  of the global COVID-19 pandemic, which necessitated consideration that the patient might be at risk for infection with the SARS-CoV-2 virus that causes COVID-19. Institutional protocols and algorithms that pertain to the evaluation of patients at risk for COVID-19 are in a state of rapid change based on information released by regulatory bodies including the CDC and federal and state organizations. These policies and algorithms were followed during the patient's care in the ED.     Final Clinical Impressions(s) / ED Diagnoses   Final diagnoses:  Dyspnea, unspecified type    ED Discharge Orders    None       Njeri Vicente, Jeannett Senior, MD 11/17/18 [phone]

## 2018-11-17 NOTE — ED Notes (Signed)
Patient's pulse ox did not drop below 96% while ambulating with her normal 2L of oxygen.

## 2018-11-17 NOTE — ED Notes (Signed)
Rancour, MD at bedside. 

## 2018-11-18 LAB — SARS CORONAVIRUS 2 (TAT 6-24 HRS): SARS Coronavirus 2: NEGATIVE

## 2018-11-28 ENCOUNTER — Other Ambulatory Visit: Payer: Medicare HMO

## 2018-12-04 ENCOUNTER — Other Ambulatory Visit: Payer: Medicare HMO

## 2018-12-05 DIAGNOSIS — R0902 Hypoxemia: Secondary | ICD-10-CM | POA: Diagnosis not present

## 2018-12-05 DIAGNOSIS — J9601 Acute respiratory failure with hypoxia: Secondary | ICD-10-CM | POA: Diagnosis not present

## 2018-12-05 DIAGNOSIS — J45909 Unspecified asthma, uncomplicated: Secondary | ICD-10-CM | POA: Diagnosis not present

## 2018-12-09 ENCOUNTER — Ambulatory Visit (INDEPENDENT_AMBULATORY_CARE_PROVIDER_SITE_OTHER): Payer: Medicare HMO | Admitting: *Deleted

## 2018-12-09 DIAGNOSIS — R55 Syncope and collapse: Secondary | ICD-10-CM | POA: Diagnosis not present

## 2018-12-09 DIAGNOSIS — J45909 Unspecified asthma, uncomplicated: Secondary | ICD-10-CM | POA: Diagnosis not present

## 2018-12-10 ENCOUNTER — Other Ambulatory Visit: Payer: Medicare HMO

## 2018-12-10 LAB — CUP PACEART REMOTE DEVICE CHECK
Date Time Interrogation Session: 20201104014321
Implantable Pulse Generator Implant Date: 20180420

## 2018-12-15 ENCOUNTER — Other Ambulatory Visit: Payer: Medicare HMO

## 2018-12-18 DIAGNOSIS — M170 Bilateral primary osteoarthritis of knee: Secondary | ICD-10-CM | POA: Diagnosis not present

## 2018-12-19 ENCOUNTER — Inpatient Hospital Stay (HOSPITAL_COMMUNITY): Admission: RE | Admit: 2018-12-19 | Payer: Medicare HMO | Source: Ambulatory Visit

## 2018-12-22 ENCOUNTER — Ambulatory Visit: Payer: Medicare HMO | Admitting: Pulmonary Disease

## 2018-12-22 ENCOUNTER — Telehealth: Payer: Self-pay | Admitting: Pulmonary Disease

## 2018-12-22 NOTE — Telephone Encounter (Signed)
Pt scheduled and aware

## 2018-12-22 NOTE — Telephone Encounter (Signed)
PFT is rescheduled for 02/19/2019.  Routing to [facility] as our current protocol has PCCs scheduling covid testing for these procedures.  Thanks!

## 2018-12-25 DIAGNOSIS — F319 Bipolar disorder, unspecified: Secondary | ICD-10-CM | POA: Diagnosis not present

## 2018-12-25 DIAGNOSIS — F329 Major depressive disorder, single episode, unspecified: Secondary | ICD-10-CM | POA: Diagnosis not present

## 2018-12-29 NOTE — Progress Notes (Signed)
Carelink Summary Report / Loop Recorder 

## 2019-01-05 DIAGNOSIS — R0902 Hypoxemia: Secondary | ICD-10-CM | POA: Diagnosis not present

## 2019-01-05 DIAGNOSIS — J9601 Acute respiratory failure with hypoxia: Secondary | ICD-10-CM | POA: Diagnosis not present

## 2019-01-05 DIAGNOSIS — J45909 Unspecified asthma, uncomplicated: Secondary | ICD-10-CM | POA: Diagnosis not present

## 2019-01-08 DIAGNOSIS — J45909 Unspecified asthma, uncomplicated: Secondary | ICD-10-CM | POA: Diagnosis not present

## 2019-01-12 ENCOUNTER — Ambulatory Visit (INDEPENDENT_AMBULATORY_CARE_PROVIDER_SITE_OTHER): Payer: Medicare HMO | Admitting: *Deleted

## 2019-01-12 DIAGNOSIS — R55 Syncope and collapse: Secondary | ICD-10-CM | POA: Diagnosis not present

## 2019-01-13 LAB — CUP PACEART REMOTE DEVICE CHECK
Date Time Interrogation Session: 20201208073124
Implantable Pulse Generator Implant Date: 20180420

## 2019-01-16 DIAGNOSIS — M810 Age-related osteoporosis without current pathological fracture: Secondary | ICD-10-CM | POA: Diagnosis not present

## 2019-01-16 DIAGNOSIS — F329 Major depressive disorder, single episode, unspecified: Secondary | ICD-10-CM | POA: Diagnosis not present

## 2019-01-16 DIAGNOSIS — G4700 Insomnia, unspecified: Secondary | ICD-10-CM | POA: Diagnosis not present

## 2019-02-04 DIAGNOSIS — R0902 Hypoxemia: Secondary | ICD-10-CM | POA: Diagnosis not present

## 2019-02-04 DIAGNOSIS — J9601 Acute respiratory failure with hypoxia: Secondary | ICD-10-CM | POA: Diagnosis not present

## 2019-02-04 DIAGNOSIS — J45909 Unspecified asthma, uncomplicated: Secondary | ICD-10-CM | POA: Diagnosis not present

## 2019-02-08 DIAGNOSIS — J45909 Unspecified asthma, uncomplicated: Secondary | ICD-10-CM | POA: Diagnosis not present

## 2019-02-09 ENCOUNTER — Telehealth: Payer: Self-pay

## 2019-02-09 NOTE — Telephone Encounter (Signed)
Pt states she yanked the monitor off the table while cleaning. She did a manual transmission to make sure the monitor was working.

## 2019-02-13 ENCOUNTER — Encounter (HOSPITAL_COMMUNITY): Payer: Self-pay | Admitting: Emergency Medicine

## 2019-02-13 ENCOUNTER — Ambulatory Visit (INDEPENDENT_AMBULATORY_CARE_PROVIDER_SITE_OTHER): Payer: Medicare HMO | Admitting: *Deleted

## 2019-02-13 ENCOUNTER — Other Ambulatory Visit: Payer: Self-pay

## 2019-02-13 ENCOUNTER — Emergency Department (HOSPITAL_COMMUNITY)
Admission: EM | Admit: 2019-02-13 | Discharge: 2019-02-14 | Disposition: A | Discharge status: Home / Self Care | Payer: Medicare HMO | Attending: Emergency Medicine | Admitting: Emergency Medicine

## 2019-02-13 ENCOUNTER — Emergency Department (HOSPITAL_COMMUNITY): Payer: Medicare HMO

## 2019-02-13 DIAGNOSIS — S3992XA Unspecified injury of lower back, initial encounter: Secondary | ICD-10-CM | POA: Diagnosis not present

## 2019-02-13 DIAGNOSIS — Y998 Other external cause status: Secondary | ICD-10-CM | POA: Diagnosis not present

## 2019-02-13 DIAGNOSIS — I10 Essential (primary) hypertension: Secondary | ICD-10-CM | POA: Diagnosis not present

## 2019-02-13 DIAGNOSIS — M545 Low back pain: Secondary | ICD-10-CM | POA: Diagnosis not present

## 2019-02-13 DIAGNOSIS — Z79899 Other long term (current) drug therapy: Secondary | ICD-10-CM | POA: Diagnosis not present

## 2019-02-13 DIAGNOSIS — E039 Hypothyroidism, unspecified: Secondary | ICD-10-CM | POA: Insufficient documentation

## 2019-02-13 DIAGNOSIS — M533 Sacrococcygeal disorders, not elsewhere classified: Secondary | ICD-10-CM | POA: Diagnosis not present

## 2019-02-13 DIAGNOSIS — Y92018 Other place in single-family (private) house as the place of occurrence of the external cause: Secondary | ICD-10-CM | POA: Insufficient documentation

## 2019-02-13 DIAGNOSIS — M7918 Myalgia, other site: Secondary | ICD-10-CM | POA: Insufficient documentation

## 2019-02-13 DIAGNOSIS — S79911A Unspecified injury of right hip, initial encounter: Secondary | ICD-10-CM | POA: Diagnosis not present

## 2019-02-13 DIAGNOSIS — Y9389 Activity, other specified: Secondary | ICD-10-CM | POA: Diagnosis not present

## 2019-02-13 DIAGNOSIS — W19XXXA Unspecified fall, initial encounter: Secondary | ICD-10-CM

## 2019-02-13 DIAGNOSIS — W050XXA Fall from non-moving wheelchair, initial encounter: Secondary | ICD-10-CM | POA: Diagnosis not present

## 2019-02-13 DIAGNOSIS — S5011XA Contusion of right forearm, initial encounter: Secondary | ICD-10-CM | POA: Diagnosis not present

## 2019-02-13 DIAGNOSIS — R55 Syncope and collapse: Secondary | ICD-10-CM

## 2019-02-13 DIAGNOSIS — J449 Chronic obstructive pulmonary disease, unspecified: Secondary | ICD-10-CM | POA: Insufficient documentation

## 2019-02-13 DIAGNOSIS — S59911A Unspecified injury of right forearm, initial encounter: Secondary | ICD-10-CM | POA: Diagnosis not present

## 2019-02-13 DIAGNOSIS — R52 Pain, unspecified: Secondary | ICD-10-CM | POA: Diagnosis not present

## 2019-02-13 MED ORDER — HYDROCODONE-ACETAMINOPHEN 5-325 MG PO TABS: 1.0000 | ORAL_TABLET | Freq: Four times a day (QID) | ORAL | 0 refills | Status: DC | PRN

## 2019-02-13 MED ORDER — HYDROMORPHONE HCL 1 MG/ML IJ SOLN
1.0000 mg | Freq: Once | INTRAMUSCULAR | Status: AC
  Administered 2019-02-13: 16:00:00 1 mg via INTRAMUSCULAR
  Filled 2019-02-13: qty 1

## 2019-02-13 MED ORDER — ONDANSETRON 4 MG PO TBDP
4.0000 mg | ORAL_TABLET | Freq: Once | ORAL | Status: AC
  Administered 2019-02-13: 17:00:00 4 mg via ORAL
  Filled 2019-02-13: qty 1

## 2019-02-13 NOTE — ED Notes (Signed)
Patient had an episode of emesis. Patient cleaned and put into a fresh gown and her face washed with washcloth.  MD to order ODT Zofran

## 2019-02-13 NOTE — ED Triage Notes (Signed)
Per [facility] pt from home for falling in bathroom today while transferring from wheelchair to toilet. Pt reports she slid down and once got onto floor having back pains. Shoulder pain is normal and has bruising to arms that is from previous falls. Pt is on continuous O2 via n/c at 2L.   138/80, 80HR, 20R, 99% on 3L of O2. CBG 194, temp 97.7.

## 2019-02-13 NOTE — ED Provider Notes (Signed)
[facility]-EMERGENCY DEPT Provider Note   CSN: [id number] Arrival date & time: 02/13/19  1427     History Chief Complaint  Patient presents with  . Fall  . Back Pain    Lindsey Rowe is a 74 y.o. female.  Patient states she was trying to transfer from her wheelchair and fell on her buttocks.  Patient complains of pain to her buttocks and she also has a bruise to her right forearm that occurred a few days ago when she fell  The history is provided by the patient. No language interpreter was used.  Fall This is a new problem. The current episode started 1 to 2 hours ago. The problem occurs rarely. The problem has not changed since onset.Pertinent negatives include no chest pain, no abdominal pain and no headaches. Nothing aggravates the symptoms. Nothing relieves the symptoms. She has tried nothing for the symptoms. The treatment provided no relief.  Back Pain Associated symptoms: no abdominal pain, no chest pain and no headaches        Past Medical History:  Diagnosis Date  . Arthritis   . Back pain   . Bipolar 1 disorder (HCC)   . Complication of anesthesia    hard to wake up anesthesia  . Depression   . GERD (gastroesophageal reflux disease)   . Headache(784.0)    migraines and tension headaches  . Hyperlipidemia   . Hypertension   . Hypothyroidism   . Thyroid disease     Patient Active Problem List   Diagnosis Date Noted  . COPD exacerbation (HCC) 10/08/2018  . Asthmatic bronchitis 12/02/2017  . Acute respiratory failure with hypoxia (HCC) 06/27/2017  . Diastolic dysfunction 06/27/2017  . Acute lower UTI 06/27/2017  . Acute metabolic encephalopathy 06/27/2017  . Altered mental state 06/10/2017  . Dyspnea and respiratory abnormalities 08/29/2016  . Hypoxemia 08/29/2016  . Shoulder dislocation, right, initial encounter 05/24/2016  . 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 Hyperglycemia 05/24/2016  . Anterior dislocation of right  shoulder   . Syncope 03/24/2016  . Generalized weakness 09/24/2015  . Leg pain, bilateral 09/24/2015  . Depression 09/24/2015  . Loss of appetite 09/24/2015  . Leg weakness, bilateral   . Hypothyroid 12/28/2012  . Hypovolemia 12/28/2012  . Hypotension 08/13/2012  . Dehydration 08/13/2012  . AKI (acute kidney injury) (HCC) 08/13/2012  . Hypoventilation associated with obesity syndrome (HCC) 08/13/2012  . Acute respiratory failure (HCC) 07/26/2012  . Chest pain 07/26/2012  . ANKLE SPRAIN, RIGHT 03/16/2010  . DIVERTICULOSIS, COLON 10/31/2009  . SKIN LESION 09/23/2009  . Shoulder pain, right 07/05/2009  . COUGH 03/29/2009  . Migraine 03/11/2009  . OTITIS MEDIA, RIGHT 12/13/2008  . BRUISE 12/06/2008  . ACID REFLUX DISEASE 09/13/2008  . ALLERGIC RHINITIS 05/20/2008  . UNSPECIFIED DISEASE OF HAIR AND HAIR FOLLICLES 05/20/2008  . DERMATITIS 11/12/2007  . OBESITY 07/18/2007  . HYPERCHOLESTEROLEMIA 05/13/2007  . BIPOLAR AFFECTIVE DISORDER 05/12/2007  . HYPERTENSION, BENIGN ESSENTIAL 05/12/2007  . Low back pain potentially associated with radiculopathy 05/12/2007    Past Surgical History:  Procedure Laterality Date  . APPENDECTOMY    . BACK SURGERY    . EYE SURGERY Bilateral    lens implant  . GASTRIC BYPASS    . LOOP RECORDER INSERTION N/A 05/25/2016   Procedure: Loop Recorder Insertion;  Surgeon: Will Jorja Loa, MD;  [facility];  Service: Cardiovascular;  Laterality: N/A;  . LUMBAR LAMINECTOMY/DECOMPRESSION MICRODISCECTOMY Right 07/23/2012   Procedure: LUMBAR LAMINECTOMY/DECOMPRESSION  MICRODISCECTOMY 2 LEVELS;  Surgeon: Floyce Stakes, MD;  Location: [facility];  Service: Neurosurgery;  Laterality: Right;  Right Lumbar three-four  lumbar four-five Laminectomy/Foraminotomy  . NASAL SINUS SURGERY Bilateral   . TONSILLECTOMY    . WRIST SURGERY       OB History   No obstetric history on file.     Family History  Problem Relation Age of Onset  .  Heart disease Mother   . Emphysema Father   . Diabetes Sister   . Other Brother        Brain tumor - s/p excision    Social History   Tobacco Use  . Smoking status: Never Smoker  . Smokeless tobacco: Never Used  Substance Use Topics  . Alcohol use: Yes    Comment: social  . Drug use: No    Comment: Pt + opioids as prescribed    Home Medications Prior to Admission medications   Medication Sig Start Date End Date Taking? Authorizing Provider  albuterol (ACCUNEB) 1.25 MG/3ML nebulizer solution Take 3 mLs (1.25 mg total) by nebulization every 6 (six) hours as needed for up to 5 days for wheezing. 10/09/18 02/13/19 Yes Kyle, Tyrone A, DO  Carboxymethylcellulose Sodium (EYE DROPS OP) Apply 1 drop to eye daily as needed (dry eyes).   Yes [provider]  DULoxetine (CYMBALTA) 30 MG capsule Take 1 capsule (30 mg total) by mouth 2 (two) times daily. Patient taking differently: Take 30-60 mg by mouth See admin instructions. 30 MG in the am and 60 MG at night 05/26/16  Yes Jani Gravel, MD  fluticasone [facility]) 50 MCG/ACT nasal spray Place 1 spray into both nostrils daily as needed for allergies or rhinitis. 06/29/17  Yes Rai, Ripudeep K, MD  levothyroxine (SYNTHROID, LEVOTHROID) 75 MCG tablet Take 75 mcg by mouth daily before breakfast.  09/06/15  Yes [provider]  lisinopril (PRINIVIL,ZESTRIL) 10 MG tablet Take 10 mg by mouth daily.  08/15/16  Yes [provider]  loperamide (IMODIUM) 2 MG capsule Take 1 capsule (2 mg total) by mouth 3 (three) times daily as needed for diarrhea or loose stools. 07/02/17  Yes Rai, Ripudeep K, MD  LORazepam (ATIVAN) 1 MG tablet Take 1 mg by mouth 2 (two) times daily as needed for anxiety. 12/25/18  Yes [provider]  meloxicam (MOBIC) 15 MG tablet Take 15 mg by mouth daily as needed for pain.   Yes [provider]  methocarbamol (ROBAXIN) 750 MG tablet Take 750 mg by mouth 3 (three) times daily. 12/19/18  Yes [provider]  metoprolol succinate (TOPROL-XL) 50 MG 24 hr tablet Take 1 tablet (50 mg total) by mouth daily. Take with or immediately following a meal. 05/26/18  Yes Camnitz, Will Hassell Done, MD  omeprazole (PRILOSEC) 20 MG capsule Take 20 mg by mouth 2 (two) times daily before a meal.   Yes [provider]  pravastatin (PRAVACHOL) 40 MG tablet Take 40 mg by mouth daily.   Yes [provider]  pregabalin (LYRICA) 100 MG capsule Take 1 capsule (100 mg total) by mouth 2 (two) times daily. 09/27/15  Yes Vann, Jessica U, DO  RESTASIS 0.05 % ophthalmic emulsion Place 1 drop into both eyes 2 (two) times daily.  02/19/18  Yes [provider]  vitamin B-12 (CYANOCOBALAMIN) 1000 MCG tablet Take 1,000 mcg by mouth daily.   Yes [provider]  Vitamin D, Ergocalciferol, (DRISDOL) 1.25 MG (50000 UT) CAPS capsule Take 50,000 Units by  mouth once a week. 03/03/18  Yes [provider]  albuterol (PROVENTIL HFA;VENTOLIN HFA) 108 (90 Base) MCG/ACT inhaler Inhale 2 puffs into the lungs every 6 (six) hours as needed for wheezing or shortness of breath. Patient not taking: Reported on 02/13/2019 04/07/18   Charlynne Pander, MD  doxycycline (VIBRAMYCIN) 100 MG capsule Take 1 capsule (100 mg total) by mouth 2 (two) times daily. Patient not taking: Reported on 02/13/2019 11/17/18   Glynn Octave, MD  HYDROcodone-acetaminophen (NORCO/VICODIN) 5-325 MG tablet Take 1 tablet by mouth every 6 (six) hours as needed for moderate pain. 02/13/19   Bethann Berkshire, MD    Allergies    Gabapentin and Cefadroxil  Review of Systems   Review of Systems  Constitutional: Negative for appetite change and fatigue.  HENT: Negative for congestion, ear discharge and sinus pressure.   Eyes: Negative for discharge.  Respiratory: Negative for cough.   Cardiovascular: Negative for chest pain.  Gastrointestinal: Negative for abdominal pain and diarrhea.  Genitourinary: Negative for frequency and  hematuria.  Musculoskeletal: Positive for back pain.  Skin: Negative for rash.  Neurological: Negative for seizures and headaches.  Psychiatric/Behavioral: Negative for hallucinations.    Physical Exam Updated Vital Signs BP (!) 149/78   Pulse 93   Temp 99.8 F (37.7 C) (Oral)   Resp 18   SpO2 93%   Physical Exam Vitals and nursing note reviewed.  Constitutional:      Appearance: She is well-developed.  HENT:     Head: Normocephalic.     Nose: Nose normal.  Eyes:     General: No scleral icterus.    Conjunctiva/sclera: Conjunctivae normal.  Neck:     Thyroid: No thyromegaly.  Cardiovascular:     Rate and Rhythm: Normal rate and regular rhythm.     Heart sounds: No murmur. No friction rub. No gallop.   Pulmonary:     Breath sounds: No stridor. No wheezing or rales.  Chest:     Chest wall: No tenderness.  Abdominal:     General: There is no distension.     Tenderness: There is no abdominal tenderness. There is no rebound.  Musculoskeletal:        General: Normal range of motion.     Cervical back: Neck supple.     Comments: Bruising to right forearm.  No tenderness, tenderness to sacral area  Lymphadenopathy:     Cervical: No cervical adenopathy.  Skin:    Findings: No erythema or rash.  Neurological:     Mental Status: She is alert and oriented to person, place, and time.     Motor: No abnormal muscle tone.     Coordination: Coordination normal.  Psychiatric:        Behavior: Behavior normal.     ED Results / Procedures / Treatments   Labs (all labs ordered are listed, but only abnormal results are displayed) Labs Reviewed - No data to display  EKG None  Radiology No results found.  Procedures Procedures (including critical care time)  Medications Ordered in ED Medications  HYDROmorphone (DILAUDID) injection 1 mg (1 mg Intramuscular Given 02/13/19 1541)  ondansetron (ZOFRAN-ODT) disintegrating tablet 4 mg (4 mg Oral Given 02/13/19 1709)    ED  Course  I have reviewed the triage vital signs and the nursing notes.  Pertinent labs & imaging results that were available during my care of the patient were reviewed by me and considered in my medical decision making (see chart for details).  MDM Rules/Calculators/A&P                      Fall with contusion to sacrum and old bruise to right forearm.  Patient will be given some Vicodin will follow-up with her doctor Final Clinical Impression(s) / ED Diagnoses Final diagnoses:  Fall, initial encounter    Rx / DC Orders ED Discharge Orders         Ordered    HYDROcodone-acetaminophen (NORCO/VICODIN) 5-325 MG tablet  Every 6 hours PRN     02/13/19 1850           Bethann Berkshire, MD 02/13/19 1854

## 2019-02-13 NOTE — ED Notes (Signed)
Pt using very aggressive tone with NT. Pt redressed and given a coke at this time. No other concerns/needs expressed at this time.

## 2019-02-13 NOTE — Discharge Instructions (Addendum)
Follow-up with your family doctor next week if any problems 

## 2019-02-14 DIAGNOSIS — I10 Essential (primary) hypertension: Secondary | ICD-10-CM | POA: Diagnosis not present

## 2019-02-14 DIAGNOSIS — Z7401 Bed confinement status: Secondary | ICD-10-CM | POA: Diagnosis not present

## 2019-02-14 DIAGNOSIS — M2550 Pain in unspecified joint: Secondary | ICD-10-CM | POA: Diagnosis not present

## 2019-02-15 LAB — CUP PACEART REMOTE DEVICE CHECK
Date Time Interrogation Session: 20210109192428
Implantable Pulse Generator Implant Date: 20180420

## 2019-02-16 ENCOUNTER — Other Ambulatory Visit (HOSPITAL_COMMUNITY): Payer: Medicare HMO

## 2019-02-19 ENCOUNTER — Ambulatory Visit: Payer: Medicare HMO | Admitting: Adult Health

## 2019-03-07 DIAGNOSIS — J9601 Acute respiratory failure with hypoxia: Secondary | ICD-10-CM | POA: Diagnosis not present

## 2019-03-07 DIAGNOSIS — R0902 Hypoxemia: Secondary | ICD-10-CM | POA: Diagnosis not present

## 2019-03-07 DIAGNOSIS — J45909 Unspecified asthma, uncomplicated: Secondary | ICD-10-CM | POA: Diagnosis not present

## 2019-03-09 ENCOUNTER — Other Ambulatory Visit: Payer: Self-pay | Admitting: *Deleted

## 2019-03-09 DIAGNOSIS — I712 Thoracic aortic aneurysm, without rupture, unspecified: Secondary | ICD-10-CM

## 2019-03-10 DIAGNOSIS — M810 Age-related osteoporosis without current pathological fracture: Secondary | ICD-10-CM | POA: Diagnosis not present

## 2019-03-11 DIAGNOSIS — J45909 Unspecified asthma, uncomplicated: Secondary | ICD-10-CM | POA: Diagnosis not present

## 2019-03-15 ENCOUNTER — Telehealth: Payer: Self-pay | Admitting: Pulmonary Disease

## 2019-03-15 NOTE — Telephone Encounter (Signed)
Patient called answering service for shortness of breath  No other significant symptoms  Checks her oxygen-96-97% Denies any chest pains or chest discomfort   I did encourage her to use a albuterol nebulization every 4 hours as needed  Need to call EMS to go to the emergency room if not feeling any better-scared about going to the ED   Encouraged to call the office in the morning to see whether she can be seen in the office

## 2019-03-16 ENCOUNTER — Ambulatory Visit (INDEPENDENT_AMBULATORY_CARE_PROVIDER_SITE_OTHER): Payer: Medicare HMO | Admitting: *Deleted

## 2019-03-16 DIAGNOSIS — R55 Syncope and collapse: Secondary | ICD-10-CM

## 2019-03-16 LAB — CUP PACEART REMOTE DEVICE CHECK
Date Time Interrogation Session: 20210207231526
Implantable Pulse Generator Implant Date: 20180420

## 2019-03-16 NOTE — Telephone Encounter (Signed)
Spoke with the pt  She states her breathing has improved "dramatically" since the time she spoke with Dr Wynona Neat 03/15/2019  She states she believes that she was having anxiety and that she was not aware that she could use her inhaler as much as she could  She states her breathing is back at her normal baseline today  She will keep planned f/u on 04/09/2019 with TP with PFT  Will call sooner if needed

## 2019-03-17 ENCOUNTER — Other Ambulatory Visit: Payer: Self-pay

## 2019-03-17 NOTE — Progress Notes (Signed)
ILR Remote 

## 2019-03-17 NOTE — Patient Outreach (Addendum)
[facility]) Care Management  03/17/2019  Lindsey Rowe 01-Jun-1945 [id number]   Telephone Screen  Referral Date: 03/17/2019 Referral Source: Remote Health-Urgent Referral Reason: " SW to assist with help finding housing and personal care services" Insurance: [employer] Medicare and Medicaid   Outreach attempt # 1 to patient. Spoke with patient. Discussed and reviewed referral source and reason. Patient states that she has lived alone for the past eleven years. She reports that she has no immediate family. Her closest relatives are her cousins who live in [place]. Patient reports that she requires assistance with ADLs/IADLs. She reports she is afraid to shower as she is fearful she will fall. She has rollator in the home. Last fall was in January. Patient reports she uses Medicaid and [employer] transportation for medical appts and uses a taxi for medical needs. She reports that she feels"isloated and alone especially at night" and as a result she tends to have anxiety attacks. She states that she feels like she would do better overall at a ALF and is interested in learning more info and possibly pursuing this route. She does not have a familiar facility in mind. Patient also voices that she has CAPS paperwork but just had not "felt like completing paperwork." RN CM discussed with patient CAPS application process and possible waiting list. Encouraged patient to complete application ASAP in case she is not able to get placement. RN CM discussed SW referral and patient in agreement and gave verbal consent. Also, discussed with patient possible benefits she may be eligible for through [facility] and encouraged her to call and follow up. She voiced understanding.      Plan: RN CM will send [facility] SW referral for possible assistance with placement and/or in home support.    Antionette Fairy, RN,BSN,CCM [facility] Care Management Telephonic Care Management Coordinator Direct Phone:  [phone] Toll Free: [phone] Fax: [phone]

## 2019-03-18 DIAGNOSIS — I119 Hypertensive heart disease without heart failure: Secondary | ICD-10-CM | POA: Diagnosis not present

## 2019-03-18 DIAGNOSIS — M79605 Pain in left leg: Secondary | ICD-10-CM | POA: Diagnosis not present

## 2019-03-18 DIAGNOSIS — G8929 Other chronic pain: Secondary | ICD-10-CM | POA: Diagnosis not present

## 2019-03-18 DIAGNOSIS — M549 Dorsalgia, unspecified: Secondary | ICD-10-CM | POA: Diagnosis not present

## 2019-03-18 DIAGNOSIS — S43014D Anterior dislocation of right humerus, subsequent encounter: Secondary | ICD-10-CM | POA: Diagnosis not present

## 2019-03-18 DIAGNOSIS — M79604 Pain in right leg: Secondary | ICD-10-CM | POA: Diagnosis not present

## 2019-03-18 DIAGNOSIS — F319 Bipolar disorder, unspecified: Secondary | ICD-10-CM | POA: Diagnosis not present

## 2019-03-18 DIAGNOSIS — S93409D Sprain of unspecified ligament of unspecified ankle, subsequent encounter: Secondary | ICD-10-CM | POA: Diagnosis not present

## 2019-03-18 DIAGNOSIS — J441 Chronic obstructive pulmonary disease with (acute) exacerbation: Secondary | ICD-10-CM | POA: Diagnosis not present

## 2019-03-19 ENCOUNTER — Encounter: Payer: Self-pay | Admitting: *Deleted

## 2019-03-19 ENCOUNTER — Other Ambulatory Visit: Payer: Self-pay | Admitting: *Deleted

## 2019-03-19 NOTE — Patient Outreach (Signed)
[facility]) Care Management  03/19/2019  Lindsey Rowe Sep 18, 1945 [id number]   CSW was able to make initial contact with patient today to perform phone assessment, as well as assess and assist with social work needs and services.  CSW introduced self, explained role and types of services provided through [place] [facility] ([place] [facility]).  CSW further explained to patient that CSW works with patient's Telephonic RNCM, also with [place] Management, Kandra Nicolas Florance.  CSW then explained the reason for the call, indicating that Mrs. Florance thought that patient would benefit from social work services and resources to assist with arranging in-home care services for patient, as well as providing patient with information regarding assisted living placement.  CSW obtained two HIPAA compliant identifiers from patient, which included patient's name and date of birth.  Patient admitted that she lives at home alone and is having increased difficulty with managing her own care.  Patient indicated that she is currently able to perform activities of daily living independently, but may require assistance with bathing and dressing, in the very near future.  Patient went on to say that she is unable to drive, relying on her friend, Mayo Ao to transport her to and from physician appointments, to the grocery store, salon, etc.  Patient is mainly requesting assistance with meal preparation, grocery shopping, light housekeeping duties, laundry, etc.  Patient reported that she would really like to receive assistance with placement into an assisted living facility for long-term care services, but does not wish to pursue this option until COVID-19 restrictions have been lifted.  Patient denies having any family members close-by to offer assistance.  Patient further reported that she lives in a community where no one really speaks to one-another, let alone offer  assistance.  Fortunately, patient has active Adult Medicaid coverage, through the [place]; therefore, is eligible to receive CAPS Forensic scientist).  In addition, CSW explained to patient that she can apply for [facility] through the [place], as they too accept Adult Medicaid, preventing patient from having to pay for in-home care services out-of-pocket.  CSW agreed to complete a large portion of these applications (CAPS and PCS) for patient, then mail directly to her home for review, signature and submission.  CSW agreed to mail patient a list of in-home care and respite agencies, as well as a list of home health agencies, so that patient can begin thinking about which agency she would like to use, once approved for services.  CSW also agreed to mail patient a list of transportation options in [place], along with a [employer] application and an application for [employer].  Last, CSW agreed to mail patient a list of assisted living facilities, a list of rest homes and a list of family care homes, all located in [facility], for her review, so that she will be prepared when she is ready to pursue long-term care placement.  CSW will also place a blank FL-2 Form in the resource packet to patient, which will be required for placement purposes.  Patient admitted that she would really like to get back involved with church and expressed an interest in wanting to attend Sunday Worship Service at [employer].  However, transportation is currently a barrier for patient; therefore, with patient's verbal consent, CSW contacted the receptionist at [facility]  [facility], requesting that patient be connected with elderly services and be contacted about receiving transportation to and from one of their  three worship services, offered on Sunday mornings.  Patient was most appreciative of CSW's call, and of all services provided to her through [facility], thus far.  Patient indicated that she is able to afford to pay for all of her prescription medications, does not need financial assistance with rent and utilities and never experiences food insecurities.      CSW agreed to follow-up with patient next week, on Thursday, March 26, 2019, around 11:00am, to ensure that she received the packet of resource information mailed to her home by CSW today, to answer any questions she may have pertaining to the information received, as well as assist with application completion, if necessary.  CSW was able to confirm that patient has the correct contact information for CSW, encouraging patient to contact CSW directly if additional social work needs arise in the meantime.  Patient voiced understanding and was agreeable to this plan.  CSW will route this note to patient's Primary Care Physician, [name], so that [name] is aware of CSW's involvement with patient's plan of care.  Below is a list of everything that CSW will mail to patient's home today:  List of ALF (Assisted Living Facilities) in [facility] List of [place] AL-RH-FCH (Assisted Living-Rest Home-Family Care Homes) in [place] FL-2 Form Personal Care for Ongoing Illness Radio producer (Personal Care Services) Application CAPS Energy manager) Application  CAPS (Community Alternative Program Services) Overview List of In-Home Care & Respite Agencies List of Home Health Agencies List of Transportation Options SCAT (Specialized Community Area Transportation) Application - Part A SCAT Midwife) Application - Part B Medicaid Transportation Application  Musician, BSW, MSW, LCSW  Licensed Clinical Social Worker  [facility]  Mailing [place]. [address], [place], [place] [address] Physical Address-[address], [place], [place] [address] Toll Free Main # [phone] Fax # [phone] Cell # [phone]  Office # [phone] [name]

## 2019-03-24 DIAGNOSIS — I119 Hypertensive heart disease without heart failure: Secondary | ICD-10-CM | POA: Diagnosis not present

## 2019-03-24 DIAGNOSIS — F319 Bipolar disorder, unspecified: Secondary | ICD-10-CM | POA: Diagnosis not present

## 2019-03-24 DIAGNOSIS — M79604 Pain in right leg: Secondary | ICD-10-CM | POA: Diagnosis not present

## 2019-03-24 DIAGNOSIS — S93409D Sprain of unspecified ligament of unspecified ankle, subsequent encounter: Secondary | ICD-10-CM | POA: Diagnosis not present

## 2019-03-24 DIAGNOSIS — G8929 Other chronic pain: Secondary | ICD-10-CM | POA: Diagnosis not present

## 2019-03-24 DIAGNOSIS — S43014D Anterior dislocation of right humerus, subsequent encounter: Secondary | ICD-10-CM | POA: Diagnosis not present

## 2019-03-24 DIAGNOSIS — J441 Chronic obstructive pulmonary disease with (acute) exacerbation: Secondary | ICD-10-CM | POA: Diagnosis not present

## 2019-03-24 DIAGNOSIS — M79605 Pain in left leg: Secondary | ICD-10-CM | POA: Diagnosis not present

## 2019-03-24 DIAGNOSIS — M549 Dorsalgia, unspecified: Secondary | ICD-10-CM | POA: Diagnosis not present

## 2019-03-25 DIAGNOSIS — S93409D Sprain of unspecified ligament of unspecified ankle, subsequent encounter: Secondary | ICD-10-CM | POA: Diagnosis not present

## 2019-03-25 DIAGNOSIS — S43014D Anterior dislocation of right humerus, subsequent encounter: Secondary | ICD-10-CM | POA: Diagnosis not present

## 2019-03-25 DIAGNOSIS — G8929 Other chronic pain: Secondary | ICD-10-CM | POA: Diagnosis not present

## 2019-03-25 DIAGNOSIS — F319 Bipolar disorder, unspecified: Secondary | ICD-10-CM | POA: Diagnosis not present

## 2019-03-25 DIAGNOSIS — I119 Hypertensive heart disease without heart failure: Secondary | ICD-10-CM | POA: Diagnosis not present

## 2019-03-25 DIAGNOSIS — M79605 Pain in left leg: Secondary | ICD-10-CM | POA: Diagnosis not present

## 2019-03-25 DIAGNOSIS — M79604 Pain in right leg: Secondary | ICD-10-CM | POA: Diagnosis not present

## 2019-03-25 DIAGNOSIS — M549 Dorsalgia, unspecified: Secondary | ICD-10-CM | POA: Diagnosis not present

## 2019-03-25 DIAGNOSIS — J441 Chronic obstructive pulmonary disease with (acute) exacerbation: Secondary | ICD-10-CM | POA: Diagnosis not present

## 2019-03-26 ENCOUNTER — Other Ambulatory Visit: Payer: Self-pay | Admitting: *Deleted

## 2019-03-26 NOTE — Patient Outreach (Signed)
[facility]) Care Management  03/26/2019  [employer] DEEMER [birth year] [id number]   CSW made an attempt to try and contact patient today to follow-up regarding social work services and resources, as well as to ensure that patient received the packet of resource information mailed to her home by CSW; however, patient was unavailable at the time of CSW's call.  CSW left a HIPAA compliant message on voicemail for patient and is currently awaiting a return call.  CSW will make a second outreach attempt within the next 3-4 business days, on Wednesday, April 01, 2019, around 12:00pm, if a return call is not received from patient in the meantime.  [name], BSW, MSW, LCSW  Licensed Restaurant manager, fast food [facility]  Mailing [place] [address] [place], [place] [address] Physical Address-[address], [place], [place] [address] Toll Free Main # [phone] Fax # [phone] Cell # [phone]  Office # [phone] [name]

## 2019-03-27 DIAGNOSIS — I119 Hypertensive heart disease without heart failure: Secondary | ICD-10-CM | POA: Diagnosis not present

## 2019-03-27 DIAGNOSIS — S43014D Anterior dislocation of right humerus, subsequent encounter: Secondary | ICD-10-CM | POA: Diagnosis not present

## 2019-03-27 DIAGNOSIS — M79605 Pain in left leg: Secondary | ICD-10-CM | POA: Diagnosis not present

## 2019-03-27 DIAGNOSIS — G8929 Other chronic pain: Secondary | ICD-10-CM | POA: Diagnosis not present

## 2019-03-27 DIAGNOSIS — J441 Chronic obstructive pulmonary disease with (acute) exacerbation: Secondary | ICD-10-CM | POA: Diagnosis not present

## 2019-03-27 DIAGNOSIS — M79604 Pain in right leg: Secondary | ICD-10-CM | POA: Diagnosis not present

## 2019-03-27 DIAGNOSIS — S93409D Sprain of unspecified ligament of unspecified ankle, subsequent encounter: Secondary | ICD-10-CM | POA: Diagnosis not present

## 2019-03-27 DIAGNOSIS — M549 Dorsalgia, unspecified: Secondary | ICD-10-CM | POA: Diagnosis not present

## 2019-03-27 DIAGNOSIS — F319 Bipolar disorder, unspecified: Secondary | ICD-10-CM | POA: Diagnosis not present

## 2019-03-31 DIAGNOSIS — S43014D Anterior dislocation of right humerus, subsequent encounter: Secondary | ICD-10-CM | POA: Diagnosis not present

## 2019-03-31 DIAGNOSIS — I119 Hypertensive heart disease without heart failure: Secondary | ICD-10-CM | POA: Diagnosis not present

## 2019-03-31 DIAGNOSIS — G8929 Other chronic pain: Secondary | ICD-10-CM | POA: Diagnosis not present

## 2019-03-31 DIAGNOSIS — M549 Dorsalgia, unspecified: Secondary | ICD-10-CM | POA: Diagnosis not present

## 2019-03-31 DIAGNOSIS — M79605 Pain in left leg: Secondary | ICD-10-CM | POA: Diagnosis not present

## 2019-03-31 DIAGNOSIS — S93409D Sprain of unspecified ligament of unspecified ankle, subsequent encounter: Secondary | ICD-10-CM | POA: Diagnosis not present

## 2019-03-31 DIAGNOSIS — J441 Chronic obstructive pulmonary disease with (acute) exacerbation: Secondary | ICD-10-CM | POA: Diagnosis not present

## 2019-03-31 DIAGNOSIS — F319 Bipolar disorder, unspecified: Secondary | ICD-10-CM | POA: Diagnosis not present

## 2019-03-31 DIAGNOSIS — M79604 Pain in right leg: Secondary | ICD-10-CM | POA: Diagnosis not present

## 2019-04-01 ENCOUNTER — Other Ambulatory Visit: Payer: Self-pay | Admitting: *Deleted

## 2019-04-01 ENCOUNTER — Encounter: Payer: Self-pay | Admitting: *Deleted
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
## 2019-04-02 DIAGNOSIS — M549 Dorsalgia, unspecified: Secondary | ICD-10-CM | POA: Diagnosis not present

## 2019-04-02 DIAGNOSIS — G8929 Other chronic pain: Secondary | ICD-10-CM | POA: Diagnosis not present

## 2019-04-02 DIAGNOSIS — J441 Chronic obstructive pulmonary disease with (acute) exacerbation: Secondary | ICD-10-CM | POA: Diagnosis not present

## 2019-04-02 DIAGNOSIS — I119 Hypertensive heart disease without heart failure: Secondary | ICD-10-CM | POA: Diagnosis not present

## 2019-04-02 DIAGNOSIS — S93409D Sprain of unspecified ligament of unspecified ankle, subsequent encounter: Secondary | ICD-10-CM | POA: Diagnosis not present

## 2019-04-02 DIAGNOSIS — F319 Bipolar disorder, unspecified: Secondary | ICD-10-CM | POA: Diagnosis not present

## 2019-04-02 DIAGNOSIS — M79605 Pain in left leg: Secondary | ICD-10-CM | POA: Diagnosis not present

## 2019-04-02 DIAGNOSIS — S43014D Anterior dislocation of right humerus, subsequent encounter: Secondary | ICD-10-CM | POA: Diagnosis not present

## 2019-04-02 DIAGNOSIS — M79604 Pain in right leg: Secondary | ICD-10-CM | POA: Diagnosis not present

## 2019-04-05 DIAGNOSIS — J45909 Unspecified asthma, uncomplicated: Secondary | ICD-10-CM | POA: Diagnosis not present

## 2019-04-05 DIAGNOSIS — R0902 Hypoxemia: Secondary | ICD-10-CM | POA: Diagnosis not present

## 2019-04-05 DIAGNOSIS — J9601 Acute respiratory failure with hypoxia: Secondary | ICD-10-CM | POA: Diagnosis not present

## 2019-04-06 ENCOUNTER — Inpatient Hospital Stay (HOSPITAL_COMMUNITY): Admission: RE | Admit: 2019-04-06 | Payer: Medicare HMO | Source: Ambulatory Visit

## 2019-04-06 DIAGNOSIS — H04123 Dry eye syndrome of bilateral lacrimal glands: Secondary | ICD-10-CM | POA: Diagnosis not present

## 2019-04-06 DIAGNOSIS — H0520 Unspecified exophthalmos: Secondary | ICD-10-CM | POA: Diagnosis not present

## 2019-04-08 DIAGNOSIS — I119 Hypertensive heart disease without heart failure: Secondary | ICD-10-CM | POA: Diagnosis not present

## 2019-04-08 DIAGNOSIS — S43014D Anterior dislocation of right humerus, subsequent encounter: Secondary | ICD-10-CM | POA: Diagnosis not present

## 2019-04-08 DIAGNOSIS — F319 Bipolar disorder, unspecified: Secondary | ICD-10-CM | POA: Diagnosis not present

## 2019-04-08 DIAGNOSIS — M79604 Pain in right leg: Secondary | ICD-10-CM | POA: Diagnosis not present

## 2019-04-08 DIAGNOSIS — J45909 Unspecified asthma, uncomplicated: Secondary | ICD-10-CM | POA: Diagnosis not present

## 2019-04-08 DIAGNOSIS — M79605 Pain in left leg: Secondary | ICD-10-CM | POA: Diagnosis not present

## 2019-04-08 DIAGNOSIS — G8929 Other chronic pain: Secondary | ICD-10-CM | POA: Diagnosis not present

## 2019-04-08 DIAGNOSIS — J441 Chronic obstructive pulmonary disease with (acute) exacerbation: Secondary | ICD-10-CM | POA: Diagnosis not present

## 2019-04-08 DIAGNOSIS — S93409D Sprain of unspecified ligament of unspecified ankle, subsequent encounter: Secondary | ICD-10-CM | POA: Diagnosis not present

## 2019-04-08 DIAGNOSIS — M549 Dorsalgia, unspecified: Secondary | ICD-10-CM | POA: Diagnosis not present

## 2019-04-09 ENCOUNTER — Ambulatory Visit: Payer: Medicare HMO | Admitting: Adult Health

## 2019-04-11 DIAGNOSIS — I119 Hypertensive heart disease without heart failure: Secondary | ICD-10-CM | POA: Diagnosis not present

## 2019-04-11 DIAGNOSIS — G8929 Other chronic pain: Secondary | ICD-10-CM | POA: Diagnosis not present

## 2019-04-11 DIAGNOSIS — S43014D Anterior dislocation of right humerus, subsequent encounter: Secondary | ICD-10-CM | POA: Diagnosis not present

## 2019-04-11 DIAGNOSIS — M549 Dorsalgia, unspecified: Secondary | ICD-10-CM | POA: Diagnosis not present

## 2019-04-11 DIAGNOSIS — M79605 Pain in left leg: Secondary | ICD-10-CM | POA: Diagnosis not present

## 2019-04-11 DIAGNOSIS — S93409D Sprain of unspecified ligament of unspecified ankle, subsequent encounter: Secondary | ICD-10-CM | POA: Diagnosis not present

## 2019-04-11 DIAGNOSIS — J441 Chronic obstructive pulmonary disease with (acute) exacerbation: Secondary | ICD-10-CM | POA: Diagnosis not present

## 2019-04-11 DIAGNOSIS — F319 Bipolar disorder, unspecified: Secondary | ICD-10-CM | POA: Diagnosis not present

## 2019-04-11 DIAGNOSIS — M79604 Pain in right leg: Secondary | ICD-10-CM | POA: Diagnosis not present

## 2019-04-15 ENCOUNTER — Encounter: Payer: Self-pay | Admitting: *Deleted

## 2019-04-15 ENCOUNTER — Other Ambulatory Visit: Payer: Self-pay | Admitting: *Deleted

## 2019-04-15 NOTE — Patient Outreach (Signed)
[facility]) Care Management  04/15/2019  EULAMAE GREENSTEIN [birth year] [id number]   CSW was able to make contact with patient today to follow-up regarding social work services and resources, as well as to ensure that patient received the packet of resource information mailed to her home by CSW.  Patient confirmed receiving all of the following resource information from CSW: List of ALF (Assisted Living Facilities) in [facility] List of [place] AL-RH-FCH ([place]) in [place] for Ongoing Illness Application PCS ([place]) Armed forces technical officer ([place]) Instructions CAPS ([place]) Application  CAPS (Community Alternative Program Services) Overview List of [place] List of [place] List of Transportation Options SCAT ([place]) Application -Part A SCAT Paramedic) Application -Part B Patient admitted to having reviewed the information, but denied "wanting to do anything with it right now, just wanted to have it for future reference".  CSW offered to assist patient with application completion, or assist patient with the referral process; however, patient declined.    CSW will perform a case closure on patient, as all goals of treatment have been met from social work standpoint and no additional social work needs have been identified at this time.  CSW will notify patient's Telephonic RNCM with [place] [facility], Enzo [place] of CSW's plans to close patient's case.  CSW will fax an update to patient's Primary Care Physician, Dr. Kelton Pillar to ensure that they are aware of CSW's involvement with patient's plan of care.  CSW was able to confirm that patient has the correct contact information for CSW,  encouraging patient to contact CSW directly if additional social work needs arise in the near future.  Nat Christen, BSW, MSW, LCSW  Licensed Education officer, environmental Health System  [place]ailing [place] [address], [place], [place] [address] Physical Address-[address], [place], [place] [address] Toll Free Main # [phone] Fax # [phone] Cell # [phone]  Office # [phone] [email or web address]

## 2019-04-16 ENCOUNTER — Ambulatory Visit (INDEPENDENT_AMBULATORY_CARE_PROVIDER_SITE_OTHER): Payer: Medicare HMO | Admitting: *Deleted

## 2019-04-16 DIAGNOSIS — R55 Syncope and collapse: Secondary | ICD-10-CM | POA: Diagnosis not present

## 2019-04-16 LAB — CUP PACEART REMOTE DEVICE CHECK
Date Time Interrogation Session: 20210310235740
Implantable Pulse Generator Implant Date: 20180420

## 2019-04-17 DIAGNOSIS — M1712 Unilateral primary osteoarthritis, left knee: Secondary | ICD-10-CM | POA: Diagnosis not present

## 2019-04-17 DIAGNOSIS — M1711 Unilateral primary osteoarthritis, right knee: Secondary | ICD-10-CM | POA: Diagnosis not present

## 2019-04-17 DIAGNOSIS — M170 Bilateral primary osteoarthritis of knee: Secondary | ICD-10-CM | POA: Diagnosis not present

## 2019-04-17 NOTE — Progress Notes (Signed)
ILR Remote 

## 2019-04-22 DIAGNOSIS — M79605 Pain in left leg: Secondary | ICD-10-CM | POA: Diagnosis not present

## 2019-04-22 DIAGNOSIS — M549 Dorsalgia, unspecified: Secondary | ICD-10-CM | POA: Diagnosis not present

## 2019-04-22 DIAGNOSIS — J441 Chronic obstructive pulmonary disease with (acute) exacerbation: Secondary | ICD-10-CM | POA: Diagnosis not present

## 2019-04-22 DIAGNOSIS — S43014D Anterior dislocation of right humerus, subsequent encounter: Secondary | ICD-10-CM | POA: Diagnosis not present

## 2019-04-22 DIAGNOSIS — F319 Bipolar disorder, unspecified: Secondary | ICD-10-CM | POA: Diagnosis not present

## 2019-04-22 DIAGNOSIS — M79604 Pain in right leg: Secondary | ICD-10-CM | POA: Diagnosis not present

## 2019-04-22 DIAGNOSIS — S93409D Sprain of unspecified ligament of unspecified ankle, subsequent encounter: Secondary | ICD-10-CM | POA: Diagnosis not present

## 2019-04-22 DIAGNOSIS — G8929 Other chronic pain: Secondary | ICD-10-CM | POA: Diagnosis not present

## 2019-04-22 DIAGNOSIS — I119 Hypertensive heart disease without heart failure: Secondary | ICD-10-CM | POA: Diagnosis not present

## 2019-04-28 ENCOUNTER — Other Ambulatory Visit: Payer: Self-pay | Admitting: *Deleted

## 2019-04-28 ENCOUNTER — Encounter: Payer: Self-pay | Admitting: *Deleted

## 2019-04-28 NOTE — Patient Outreach (Signed)
[facility]) Care Management  04/28/2019  JASDEEP DEJARNETT 08-27-1945 [id number]   CSW received an incoming call from Danie Chandler (# [phone]; # [phone]), both with [name], voicing their concerns about patient's current home situation and living conditions.  Toniann Fail and Corrie Dandy report that patient is now agreeable to receiving in-home care services, wanting to submit applications for CAPS Energy manager) and [employer] (Personal Care Services).  CSW explained to Toniann Fail and Corrie Dandy that CSW had already completed applications on patient's behalf and mailed them to patient's home for her review and signature.  Patient was then encouraged to have her Primary Care Physician, [name] sign off on her portion of the applications, notifying CSW once this process has taken place.  CSW then agreed to retrieve the completed and signed applications from patient and submit them to the [facility] of Social Services and the [employer] of [employer] and [employer] for processing.  When CSW spoke with patient on Wednesday, April 15, 2019, patient confirmed receipt of the applications, as well as resource information mailed to her home by CSW.  However, "patient denied wanting to do anything with the information and applications at that time, just requesting it for future reference".  CSW is unaware as to whether or not patient signed the applications, had [name] sign off on her portion of the applications, or even submitted the applications for processing.  CSW made an attempt to try and contact patient today to follow-up, but patient was unavailable at the time of CSW's call.  CSW left a HIPAA compliant message on voicemail for patient and is currently awaiting a return call.  CSW will make a second outreach attempt within the next 3-4 business days, on Monday, May 04, 2019, if a return  call is not received from patient in the meantime.  CSW will also follow-up with Toniann Fail and Corrie Dandy, to report findings, as well as mail an Unsuccessful Outreach Letter to patient's home.  [name], BSW, MSW, LCSW  Licensed Restaurant manager, fast food Health System  Mailing [place] [address] [place], [place] [address] Physical Address[address], [place], [place] [address] Toll Free Main # [phone] Fax # [phone] Cell # [phone]  Office # [phone] [name]

## 2019-04-29 ENCOUNTER — Other Ambulatory Visit: Payer: Medicare HMO

## 2019-04-29 ENCOUNTER — Ambulatory Visit: Payer: Medicare HMO | Admitting: Surgery

## 2019-04-29 DIAGNOSIS — E785 Hyperlipidemia, unspecified: Secondary | ICD-10-CM | POA: Insufficient documentation

## 2019-05-01 ENCOUNTER — Other Ambulatory Visit: Payer: Self-pay | Admitting: *Deleted
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
## 2019-05-04 ENCOUNTER — Ambulatory Visit: Payer: Self-pay | Admitting: *Deleted

## 2019-05-05 DIAGNOSIS — J9601 Acute respiratory failure with hypoxia: Secondary | ICD-10-CM | POA: Diagnosis not present

## 2019-05-05 DIAGNOSIS — I119 Hypertensive heart disease without heart failure: Secondary | ICD-10-CM | POA: Diagnosis not present

## 2019-05-05 DIAGNOSIS — M79605 Pain in left leg: Secondary | ICD-10-CM | POA: Diagnosis not present

## 2019-05-05 DIAGNOSIS — M549 Dorsalgia, unspecified: Secondary | ICD-10-CM | POA: Diagnosis not present

## 2019-05-05 DIAGNOSIS — F319 Bipolar disorder, unspecified: Secondary | ICD-10-CM | POA: Diagnosis not present

## 2019-05-05 DIAGNOSIS — S43014D Anterior dislocation of right humerus, subsequent encounter: Secondary | ICD-10-CM | POA: Diagnosis not present

## 2019-05-05 DIAGNOSIS — R0902 Hypoxemia: Secondary | ICD-10-CM | POA: Diagnosis not present

## 2019-05-05 DIAGNOSIS — J441 Chronic obstructive pulmonary disease with (acute) exacerbation: Secondary | ICD-10-CM | POA: Diagnosis not present

## 2019-05-05 DIAGNOSIS — J45909 Unspecified asthma, uncomplicated: Secondary | ICD-10-CM | POA: Diagnosis not present

## 2019-05-05 DIAGNOSIS — G8929 Other chronic pain: Secondary | ICD-10-CM | POA: Diagnosis not present

## 2019-05-05 DIAGNOSIS — M79604 Pain in right leg: Secondary | ICD-10-CM | POA: Diagnosis not present

## 2019-05-05 DIAGNOSIS — S93409D Sprain of unspecified ligament of unspecified ankle, subsequent encounter: Secondary | ICD-10-CM | POA: Diagnosis not present

## 2019-05-06 ENCOUNTER — Other Ambulatory Visit: Payer: Self-pay | Admitting: *Deleted

## 2019-05-06 ENCOUNTER — Other Ambulatory Visit: Payer: Self-pay | Admitting: Family Medicine

## 2019-05-06 DIAGNOSIS — E7800 Pure hypercholesterolemia, unspecified: Secondary | ICD-10-CM | POA: Diagnosis not present

## 2019-05-06 DIAGNOSIS — E041 Nontoxic single thyroid nodule: Secondary | ICD-10-CM | POA: Diagnosis not present

## 2019-05-06 DIAGNOSIS — K219 Gastro-esophageal reflux disease without esophagitis: Secondary | ICD-10-CM | POA: Diagnosis not present

## 2019-05-06 DIAGNOSIS — M810 Age-related osteoporosis without current pathological fracture: Secondary | ICD-10-CM | POA: Diagnosis not present

## 2019-05-06 DIAGNOSIS — G43909 Migraine, unspecified, not intractable, without status migrainosus: Secondary | ICD-10-CM | POA: Diagnosis not present

## 2019-05-06 DIAGNOSIS — Z0000 Encounter for general adult medical examination without abnormal findings: Secondary | ICD-10-CM | POA: Diagnosis not present

## 2019-05-06 DIAGNOSIS — N183 Chronic kidney disease, stage 3 unspecified: Secondary | ICD-10-CM | POA: Diagnosis not present

## 2019-05-06 DIAGNOSIS — M4800 Spinal stenosis, site unspecified: Secondary | ICD-10-CM | POA: Diagnosis not present

## 2019-05-06 DIAGNOSIS — J449 Chronic obstructive pulmonary disease, unspecified: Secondary | ICD-10-CM | POA: Diagnosis not present

## 2019-05-06 DIAGNOSIS — F319 Bipolar disorder, unspecified: Secondary | ICD-10-CM | POA: Diagnosis not present

## 2019-05-06 DIAGNOSIS — I129 Hypertensive chronic kidney disease with stage 1 through stage 4 chronic kidney disease, or unspecified chronic kidney disease: Secondary | ICD-10-CM | POA: Diagnosis not present

## 2019-05-06 DIAGNOSIS — Z1389 Encounter for screening for other disorder: Secondary | ICD-10-CM | POA: Diagnosis not present

## 2019-05-06 NOTE — Patient Outreach (Signed)
[facility]) Care Management  05/06/2019  Lindsey Rowe 10-Oct-1945 [id number]  CSW was able to make contact with patient today to follow-up regarding social work services and resources, as well as update patient on CSW's conversation with [name] [phone]), Staffing Administrator with [facility].  In talking with Mr. Lindsey Rowe, [name] that patient is still eligible to receive 64 hours of [place] per month, but that patient's hours may decrease to 34 hours per month, after her hearing on August 04, 2019.  Mr. Lindsey Rowe went on to say that patient actually called to cancel her [place] with his agency; hence the reason why she has not been receiving [place] for the past four months.  Fortunately, Mr. Lindsey Rowe did not close patient's case, explaining to her that if she contacted [employer] to request a new agency then it would send up a huge red flag.  [employer] would question patient's need for ongoing [employer], especially after having gone without for four months.    Mr. Lindsey Rowe explained that he is doing everything in his power to avoid patient from having to go through the entire application process all over again.  Mr. Lindsey Rowe admitted that his agency is having major staffing issues right now, along with every other in-home care agency, due to COVID-19 restrictions.  Mr. Lindsey Rowe further explained that he has not been able to staff patient's case for the entire 64 hours per month, because of all the staffing issues, but that he is trying to provide patient with at least 34 hours per month, until his staffing issues have been resolved.  Mr. Lindsey Rowe stated, "At this point, I am just trying to maintain her services".  Mr. Lindsey Rowe was extremely apologetic, explaining that staff members are just not showing up for work and that new hires are not working  for more than a week or two before quitting.  In addition, Mr. Lindsey Rowe reported that his agency is simply not receiving the volume of applicants that they once did, and that there is definitely not enough staff to serve the amount of people eligible for services through their agency.    CSW explained all of the above information to patient, encouraging patient to reconsider allowing Mr. Lindsey Rowe to continue to try and staff her case.  Patient voiced understanding and was agreeable to this plan, admitting that it is definitely better than the alternative, which is not having anyone.  CSW agreed to contact Mr. Lindsey Rowe on patient's behalf to having patient's [place] reinstated.  In addition, CSW agreed to contact [name] to get patient re-established with her Psychiatrist, [name], for medication management.  CSW will also schedule patient an appointment to meet with a therapist through [name] to receive counseling and supportive services.  Patient was very much in favor of this, admitting that "she believes that her bipolar is flaring up again and that she may require a medication adjustment".  Patient stated, "I can tell when something is off, and something is definitely off with me, one minute I am happy and laughing and the next minute I am sad, crying and talking about killing myself".  Patient indicated that she continues to receive bi-weekly visits from [name], Nurse with [name].  Patient went on to say that [name] agreed to try and help her find a wheel for  her Rolator walker, having recently broken it when she fell on it.  Patient denied sustaining injury from the fall, just angry that she is currently unable to use her walker.  Patient is also receiving home health physical therapy through [name], having been approved for 4 weeks of therapy, two sessions per week.  CSW  explained to patient that [name], Physical Therapist with [name], informed CSW that patient mentioned wanting to receive financial assistance to help properly bury her brother, after having seen his ashes in a box in patient's home.  CSW provided patient with the following resource information:  [email or web address];  [email or web address].  CSW agreed to follow-up with patient again as soon as CSW is able to get patient an appointment scheduled with [name] and a counselor/therapist, both with [facility].  Otherwise, CSW will follow-up with patient again next week, on Thursday, May 14, 2019, around 12:00pm.  Patient agreed to contact CSW directly if additional social work needs arise in the meantime.  CSW will route this note to patient's Primary Care Physician, [name] to ensure that [name] is aware of CSW's involvement with patient's current plan of care.  [name], BSW, MSW, LCSW  Licensed Restaurant manager, fast food Health System  Mailing [place] [address], [place], [place] [address] Physical Address-[address], [place], [place] [address] Toll Free Main # [phone] Fax # [phone] Cell # [phone]  Office # [phone] [name]

## 2019-05-07 ENCOUNTER — Other Ambulatory Visit: Payer: Self-pay | Admitting: Family Medicine

## 2019-05-07 ENCOUNTER — Ambulatory Visit: Payer: Self-pay | Admitting: *Deleted

## 2019-05-07 DIAGNOSIS — Z1231 Encounter for screening mammogram for malignant neoplasm of breast: Secondary | ICD-10-CM

## 2019-05-11 ENCOUNTER — Other Ambulatory Visit: Payer: Medicare HMO

## 2019-05-14 ENCOUNTER — Other Ambulatory Visit: Payer: Self-pay | Admitting: *Deleted

## 2019-05-14 DIAGNOSIS — Z961 Presence of intraocular lens: Secondary | ICD-10-CM | POA: Diagnosis not present

## 2019-05-14 DIAGNOSIS — H547 Unspecified visual loss: Secondary | ICD-10-CM | POA: Diagnosis not present

## 2019-05-14 DIAGNOSIS — H57811 Brow ptosis, right: Secondary | ICD-10-CM | POA: Diagnosis not present

## 2019-05-14 DIAGNOSIS — H04122 Dry eye syndrome of left lacrimal gland: Secondary | ICD-10-CM | POA: Diagnosis not present

## 2019-05-14 DIAGNOSIS — N289 Disorder of kidney and ureter, unspecified: Secondary | ICD-10-CM | POA: Insufficient documentation

## 2019-05-14 NOTE — Patient Outreach (Signed)
[facility]) Care Management  05/14/2019  Lindsey Rowe [birth year] [id number]   CSW made an attempt to try and contact patient today to follow-up regarding social work services and resources; however, patient was unavailable at the time of CSW's call.  CSW left a HIPAA compliant message on voicemail for patient and is currently awaiting a return call.  CSW will make a second outreach attempt within the next 3-4 business days, if a return call is not received from patient in the meantime.  [name], BSW, MSW, LCSW  Licensed Restaurant manager, fast food Health System  Mailing [place] [address] [place], [place] [address] Physical Address-[address], [place], [place] [address] Toll Free Main # [phone] Fax # [phone] Cell # [phone]  Office # [phone] [name]

## 2019-05-15 ENCOUNTER — Other Ambulatory Visit: Payer: Medicare HMO

## 2019-05-18 ENCOUNTER — Ambulatory Visit (INDEPENDENT_AMBULATORY_CARE_PROVIDER_SITE_OTHER): Payer: Medicare HMO | Admitting: *Deleted

## 2019-05-18 DIAGNOSIS — R55 Syncope and collapse: Secondary | ICD-10-CM | POA: Diagnosis not present

## 2019-05-18 LAB — CUP PACEART REMOTE DEVICE CHECK
Date Time Interrogation Session: 20210411024857
Implantable Pulse Generator Implant Date: 20180420

## 2019-05-19 NOTE — Progress Notes (Signed)
ILR Remote 

## 2019-05-20 ENCOUNTER — Other Ambulatory Visit: Payer: Self-pay | Admitting: *Deleted

## 2019-05-20 ENCOUNTER — Encounter: Payer: Self-pay | Admitting: *Deleted

## 2019-05-20 ENCOUNTER — Ambulatory Visit: Payer: Self-pay | Admitting: *Deleted

## 2019-05-20 ENCOUNTER — Ambulatory Visit: Payer: Medicare HMO | Admitting: Surgery

## 2019-05-20 NOTE — Patient Outreach (Signed)
[facility]) Care Management  05/20/2019  Lindsey Rowe [birth year] [id number]   CSW made a second attempt to try and contact patient today to follow-up regarding social work services and resources, as well as to provide patient with an update on her appointment with [facility]; however, patient was unavailable at the time of CSW's call.  CSW left a HIPAA compliant message for patient on voicemail and continues to await a return call.  CSW will mail an Unsuccessful Outreach Letter to patient's home, encouraging patient to contact CSW at her earliest convenience, if she is interested in continuing to receive social work services through CSW with Chief Executive Officer.  CSW will make a third and final outreach attempt within the next 3-4 business days, if a return call is not received from patient in the meantime.  CSW will then proceed with case closure if a return call is not received from patient within a total of 10 business days, as required number of phone attempts will have been made and Unsuccessful Outreach Letter mailed.    [name], BSW, MSW, LCSW  Licensed Restaurant manager, fast food Health System  Mailing [place] [address] [place], [place] [address] Physical Address-[address], [place], [place] [address] Toll Free Main # [phone] Fax # [phone] Cell # [phone]  Office # [phone] [name]

## 2019-05-22 ENCOUNTER — Ambulatory Visit: Payer: Medicare HMO

## 2019-05-25 ENCOUNTER — Other Ambulatory Visit: Payer: Self-pay | Admitting: *Deleted

## 2019-05-25 NOTE — Patient Outreach (Signed)
[facility]) Care Management  05/25/2019  Lindsey Rowe November 21, 1945 [id number]   CSW made a third and final attempt to try and contact patient today to follow-up regarding social work services and resources, as well as to provide patient with an update on her appointment with [facility]; however, patient was unavailable at the time of CSW's call.  CSW left a HIPAA compliant message on voicemail for patient and continues to await a return call.  CSW already mailed an Unsuccessful Outreach Letter to patient's home, encouraging patient to contact CSW at her earliest convenience, if she was interested in continuing to receive social work services through CSW with Chief Executive Officer.  CSW will proceed with case closure in the next two business days, if a return call is not received from patient in the meantime, as patient will have been given a total of 10 business days to respond if she was interested in receiving services.   [name], BSW, MSW, LCSW  Licensed Restaurant manager, fast food Health System  Mailing [place] [address] [place], [place] [address] Physical Address-[address], [place], [place] [address] Toll Free Main # [phone] Fax # [phone] Cell # [phone]  Office # [phone] [name]

## 2019-05-26 ENCOUNTER — Inpatient Hospital Stay: Admission: RE | Admit: 2019-05-26 | Payer: Medicare HMO | Source: Ambulatory Visit

## 2019-05-27 ENCOUNTER — Encounter: Payer: Self-pay | Admitting: *Deleted

## 2019-05-27 ENCOUNTER — Other Ambulatory Visit: Payer: Self-pay | Admitting: *Deleted

## 2019-05-27 NOTE — Patient Outreach (Signed)
[facility]) Care Management  05/27/2019  Lindsey Rowe 10-26-1945 [id number]   CSW received a return call from patient last evening, after hours, in response to the HIPAA compliant messages left for patient on voicemail by CSW on April 8th, April 14th and April 19th.  Patient encouraged CSW to return her call today to discuss scheduling her an appointment with [facility] for medication management and psychotherapeutic services.  CSW made several attempts to try and contact patient today, before actually being successful.  Patient admitted that she has been sleeping for the majority of the day, not having slept for the past several nights.  CSW explained to patient that [facility] is not currently accepting new clients, nor are they providing existing clients with mental health services, as they are preparing to relocate their facility in early May of 2021.  CSW agreed to mail patient a list of psychiatrists in [place], [place] that accept [place], as well as a list of therapists in [place], [place] that accept [place].  CSW also provided this list to patient over the phone, which included all of the following psychiatrists and therapists, encouraging patient to begin making calls to try and establish services.  [facility] Psychiatrist [place], [place] [phone]  Dr. Milagros Evener, MD Psychiatrist [place], [place] [phone]  Charlies Silvers, MD Psychiatrist [place], [place] [phone]  Dr. Jacqulynn Cadet. Bernardo Heater, MD Psychiatrist [place], [place] [phone]  [facility] Psychiatrist [place], [place] [phone], Inc. Psychiatrist [place], [place] [phone]  Dorene Ar, MD Psychiatrist [place], [place] [phone]  [facility] [place], [place] [phone]  [facility] Psychiatrist [place], [place] [phone]  Thedore Mins, MD Psychiatrist [place], [place] [phone]  [facility] [place], [place] [phone]  Huprich Corinda Gubler, MD Psychiatrist  [place], [place] [phone]  Dr. Rolan Lipa. Ladona Ridgel, MD Psychiatrist [place], [place] [phone]  Dr. Elinor Parkinson. Worthy Rancher, MD Psychiatrist [place], [place] [phone]  Marguerita Beards, MD Psychiatrist [place], [place] [phone]  [facility] Address: [address] [place], [place], [place] [address] Phone: [phone] Website: [email or web address].[email or web address]  [facility] Address: [address] [place], [place], [place] [address] Phone: [phone] Website: [email or web address]://[email or web address]  [facility] Address: [address], [place], [place] [address] Phone: [phone] Website: [email or web address]://[email or web address].[email or web address]/  [facility] Address: [address] [place], [place], [place] [address] Phone: [phone] Website: [email or web address]://www.[email or web address]/  [facility] Address: [address] [place], [place] [address] Phone: [phone] Website: [email or web address]://khan-[email or web address]/  [facility] Address: [address] [place], [place] [address] Phone: [phone] Website: [email or web address]://collaborativecounselingservice.com/  [facility] Address: [address], [place], [place] [address] Phone: [phone] Website: [email or web address]://www.[email or web address]/  [employer], [facility] Address: [address] [place], [place] [address] Phone: [phone] Website: [email or web address]://www.[email or web address]/  Jacelyn Pi, PHD [facility] LMFT Address: [address] [place], [place] [address] Phone: [phone] Website: ReportNation.co.uk  [facility] Address: [address] [place], [place] [address] Phone: [phone] Website: [email or web address]://tlc-counseling.[email or web address]/  [facility] Address: [address] [place], [place] [address] Phone: [phone] Website: GenitalDoctor.no  Alonza Smoker Therapist  Author Address: [address] [place], [place], [place] [address] Phone: [phone] Website: ProgramCam.de  [facility] Address: [address], [place], [place] [address]  Phone: [phone]        [facility] Address: [address] [place], [place] [address] Phone: [phone] Website: BeachActivity.ca  [facility] Address: [address] [place], [place] [address] Phone: [phone] Website: [email or web address]://collaborativecounselingservice.com/  [facility] Address: [address], [place], [place] [address] Phone: [phone] Website: http://www.newhorizonscce.[email or web address]/  [employer], [facility] Address: [place], [place], [place] [address] Phone: [phone] Website: http://www.greensborocounselingpartners.[email or web address]/  Jacki Cones, PHD [facility] LMFT Address: [place], [place], [place] [address] Phone: [phone]       Website: TastyShow.cz        [facility] Address: [place], [place], [place] [address] Phone: [phone] Website: [email or web address]://www.[email or web address]/  [facility] Address: [address]ette [place], [place] [address] Phone: [phone] Website: [email or web address]://www.[email or web address]/  [facility] Address: [address] [place], [place] [address] Phone: [phone] Website: [email or web address]://[email or web address].com/  [facility] Address: [place], [place], [place]  [address] Phone: [phone] Website: [email or web address]://[email or web address].[email or web address]/  CSW tried calling several of the agencies listed, on patient's behalf; however, they would not allow CSW to schedule the initial appointment for patient, requesting that patient call to schedule her own appointment.  CSW requested that patient contact CSW as soon as she has an appointment scheduled, agreeing to assist patient with transportation arrangements, if necessary.  Otherwise, CSW will make arrangements to follow-up with patient again next week, on Wednesday, June 03, 2019, around 10:30am.  Patient voiced understanding and was most appreciative of the call and for all the resources provided.  Nat Christen, BSW, MSW, LCSW  Licensed Education officer, environmental Health System  Mailing [place] [address] [place], [place] [address] Physical Address[address], [place],  [address] Toll Free Main # [phone] Fax # [phone] Cell # [phone]  Office # [phone] [email or web address]

## 2019-05-28 ENCOUNTER — Other Ambulatory Visit: Payer: Medicare HMO

## 2019-06-02 ENCOUNTER — Inpatient Hospital Stay: Admission: RE | Admit: 2019-06-02 | Payer: Medicare HMO | Source: Ambulatory Visit

## 2019-06-02 ENCOUNTER — Other Ambulatory Visit: Payer: Self-pay | Admitting: *Deleted

## 2019-06-02 NOTE — Patient Outreach (Signed)
[facility]) Care Management  06/02/2019  Lindsey Rowe 06-Dec-1945 [id number]   CSW received an incoming call from patient this morning, explaining that she has been able to schedule an appointment for counseling services with Delight Ovens, Licensed Clinical Social Worker with [facility], for Wednesday, Jul 01, 2019 at 1:00pm.  During this appointment, patient will be scheduled with a psychiatrist for medication administration and management.  Patient confirmed receipt of the list of therapists and psychiatrists that CSW mailed to her home last week, debating whether or not she wants to call around to see if she is able to schedule an appointment sooner.  Patient agreed to contact CSW, to provide the appointment date, time, location and provider, if she does decide to schedule an earlier appointment.  Otherwise, CSW agreed to follow-up with patient again in a few weeks, on Tuesday, Jun 16, 2019, around 9:00am.  [name], BSW, MSW, [employer]  Licensed Clinical Social Financial planner Health System  Mailing [place] [address], [place], [place] [address] Physical Address-[address], [place], [place] [address] Toll Free Main # [phone] Fax # [phone] Cell # [phone]  Office # [phone] [name]

## 2019-06-03 ENCOUNTER — Ambulatory Visit: Payer: Medicare HMO | Admitting: *Deleted

## 2019-06-03 ENCOUNTER — Ambulatory Visit: Payer: Medicare HMO | Admitting: Surgery

## 2019-06-04 ENCOUNTER — Encounter: Payer: Self-pay | Admitting: Surgery

## 2019-06-04 ENCOUNTER — Other Ambulatory Visit: Payer: Medicare HMO

## 2019-06-05 DIAGNOSIS — J45909 Unspecified asthma, uncomplicated: Secondary | ICD-10-CM | POA: Diagnosis not present

## 2019-06-05 DIAGNOSIS — J9601 Acute respiratory failure with hypoxia: Secondary | ICD-10-CM | POA: Diagnosis not present

## 2019-06-05 DIAGNOSIS — R0902 Hypoxemia: Secondary | ICD-10-CM | POA: Diagnosis not present

## 2019-06-06 DIAGNOSIS — J45909 Unspecified asthma, uncomplicated: Secondary | ICD-10-CM | POA: Diagnosis not present

## 2019-06-16 ENCOUNTER — Other Ambulatory Visit (HOSPITAL_COMMUNITY): Payer: Medicare HMO

## 2019-06-16 ENCOUNTER — Other Ambulatory Visit: Payer: Self-pay | Admitting: *Deleted

## 2019-06-16 NOTE — Patient Outreach (Signed)
[facility]) Care Management  06/16/2019  CIONNA COLLANTES 1946-01-14 [id number]   CSW made an attempt to try and contact patient today to follow-up regarding social work services and resources; however, patient was unavailable at the time of CSW's call.  CSW left a HIPAA compliant message on voicemail for patient, currently awaiting a return call.  CSW will make a second outreach attempt within the next 3-4 business days, if a return call is not received from patient in the meantime.    [name], BSW, MSW, LCSW  Licensed Restaurant manager, fast food Health System  Mailing [place] [address] [place], [place] [address] Physical Address[address] [place], [place], [place] [address] Toll Free Main # [phone] Fax # [phone] Cell # [phone]  Office # [phone] [name]

## 2019-06-18 ENCOUNTER — Other Ambulatory Visit: Payer: Self-pay | Admitting: *Deleted

## 2019-06-18 DIAGNOSIS — R0600 Dyspnea, unspecified: Secondary | ICD-10-CM

## 2019-06-18 DIAGNOSIS — R0609 Other forms of dyspnea: Secondary | ICD-10-CM

## 2019-06-18 LAB — CUP PACEART REMOTE DEVICE CHECK
Date Time Interrogation Session: 20210512025321
Implantable Pulse Generator Implant Date: 20180420

## 2019-06-19 ENCOUNTER — Ambulatory Visit: Payer: Medicare HMO | Admitting: Adult Health

## 2019-06-22 ENCOUNTER — Ambulatory Visit (INDEPENDENT_AMBULATORY_CARE_PROVIDER_SITE_OTHER): Payer: Medicare HMO | Admitting: *Deleted

## 2019-06-22 ENCOUNTER — Other Ambulatory Visit: Payer: Self-pay | Admitting: *Deleted

## 2019-06-22 ENCOUNTER — Encounter: Payer: Self-pay | Admitting: *Deleted

## 2019-06-22 DIAGNOSIS — R55 Syncope and collapse: Secondary | ICD-10-CM | POA: Diagnosis not present

## 2019-06-22 NOTE — Patient Outreach (Signed)
[facility]) Care Management  06/22/2019  Lindsey Rowe 02-May-1945 [id number]   CSW made a second attempt to try and contact patient today to follow-up regarding social work services and resources; however, patient was unavailable at the time of CSW's call.  CSW left a HIPAA compliant message for patient on voicemail and continues to await a return call.  CSW will make a third and final outreach attempt within the next 3-4 business days, if a return call is not received from patient in the meantime.  CSW will also mail an Unsuccessful Outreach Letter to patient's home, encouraging patient to contact CSW directly if she is interested in continuing to receive social work services through CSW with Chief Executive Officer.    [name], BSW, MSW, LCSW  Licensed Restaurant manager, fast food Health System  Mailing [place] [address] [place], [place] [address] Physical Address[address] [place], [place], [place] [address] Toll Free Main # [phone] Fax # [phone] Cell # [phone]  Office # [phone] [name]

## 2019-06-23 NOTE — Progress Notes (Signed)
Carelink Summary Report / Loop Recorder 

## 2019-06-24 DIAGNOSIS — M4800 Spinal stenosis, site unspecified: Secondary | ICD-10-CM | POA: Diagnosis not present

## 2019-06-24 DIAGNOSIS — F319 Bipolar disorder, unspecified: Secondary | ICD-10-CM | POA: Diagnosis not present

## 2019-06-24 DIAGNOSIS — N183 Chronic kidney disease, stage 3 unspecified: Secondary | ICD-10-CM | POA: Diagnosis not present

## 2019-06-24 DIAGNOSIS — R519 Headache, unspecified: Secondary | ICD-10-CM | POA: Diagnosis not present

## 2019-06-24 DIAGNOSIS — J449 Chronic obstructive pulmonary disease, unspecified: Secondary | ICD-10-CM | POA: Diagnosis not present

## 2019-06-24 DIAGNOSIS — G903 Multi-system degeneration of the autonomic nervous system: Secondary | ICD-10-CM | POA: Diagnosis not present

## 2019-06-24 DIAGNOSIS — I129 Hypertensive chronic kidney disease with stage 1 through stage 4 chronic kidney disease, or unspecified chronic kidney disease: Secondary | ICD-10-CM | POA: Diagnosis not present

## 2019-06-26 ENCOUNTER — Other Ambulatory Visit: Payer: Self-pay | Admitting: *Deleted

## 2019-06-26 NOTE — Patient Outreach (Signed)
[facility]) Care Management  06/26/2019  Lindsey Rowe 10-Jul-1945 [id number]  CSW was able to make contact with patient today to follow-up regarding social work services and resources.  Patient admitted that she is looking forward to her upcoming appointment for counseling services, with Delight Ovens, Licensed Clinical Social Worker with [facility], scheduled for Wednesday, Jul 01, 2019 at 1:00pm.  Patient indicated that she has already made arrangements for her friend, Jarvis Morgan to transport her to and from this appointment.  Patient further reported that she received a call from Josephina Gip, Staffing Administrator with [facility], indicating that he has been able to locate an in-home care worker through their agency.  The in-home care worker is currently only able to provide 4 hours of in-home care services per week.  However, once the agency is staffed at full capacity again, patient will be able to resume the maximum amount of service hours allotted through [facility].  Patient admitted that she recently hired a lady to come in and clean her house, at least once per week, in addition to washing dishes, taking out the trash, folding laundry, etc.  CSW agreed to follow-up with patient again in a week and a half, on Wednesday, July 08, 2019, around 9:00am, to inquire about patient's counseling session with Mrs. Mostafa, as well as to check the status of patient's in-home care services and increase in hours.  Patient was agreeable to this plan.  [name], BSW, MSW, LCSW  Licensed Restaurant manager, fast food Health System  Mailing [place] [address] [place], [place] [address] Physical Address[address] [place], [place], [place] [address] Toll Free Main # [phone] Fax # [phone] Cell # [phone]  Office # [phone] [name]

## 2019-06-28 ENCOUNTER — Emergency Department (HOSPITAL_COMMUNITY): Payer: Medicare HMO

## 2019-06-28 ENCOUNTER — Inpatient Hospital Stay (HOSPITAL_COMMUNITY)
Admission: EM | Admit: 2019-06-28 | Discharge: 2019-07-02 | DRG: 086 | Disposition: A | Discharge status: Skilled Nursing Facility | Payer: Medicare HMO | Attending: Internal Medicine | Admitting: Internal Medicine

## 2019-06-28 ENCOUNTER — Encounter (HOSPITAL_COMMUNITY): Payer: Self-pay

## 2019-06-28 ENCOUNTER — Other Ambulatory Visit: Payer: Self-pay

## 2019-06-28 DIAGNOSIS — D649 Anemia, unspecified: Secondary | ICD-10-CM | POA: Diagnosis present

## 2019-06-28 DIAGNOSIS — S3992XA Unspecified injury of lower back, initial encounter: Secondary | ICD-10-CM | POA: Diagnosis not present

## 2019-06-28 DIAGNOSIS — S065X9A Traumatic subdural hemorrhage with loss of consciousness of unspecified duration, initial encounter: Secondary | ICD-10-CM | POA: Diagnosis not present

## 2019-06-28 DIAGNOSIS — Z791 Long term (current) use of non-steroidal anti-inflammatories (NSAID): Secondary | ICD-10-CM | POA: Diagnosis not present

## 2019-06-28 DIAGNOSIS — R059 Cough, unspecified: Secondary | ICD-10-CM

## 2019-06-28 DIAGNOSIS — S3993XA Unspecified injury of pelvis, initial encounter: Secondary | ICD-10-CM | POA: Diagnosis not present

## 2019-06-28 DIAGNOSIS — R519 Headache, unspecified: Secondary | ICD-10-CM | POA: Diagnosis not present

## 2019-06-28 DIAGNOSIS — E785 Hyperlipidemia, unspecified: Secondary | ICD-10-CM | POA: Diagnosis present

## 2019-06-28 DIAGNOSIS — R296 Repeated falls: Secondary | ICD-10-CM | POA: Diagnosis present

## 2019-06-28 DIAGNOSIS — I10 Essential (primary) hypertension: Secondary | ICD-10-CM | POA: Diagnosis not present

## 2019-06-28 DIAGNOSIS — R531 Weakness: Secondary | ICD-10-CM | POA: Diagnosis not present

## 2019-06-28 DIAGNOSIS — Z79891 Long term (current) use of opiate analgesic: Secondary | ICD-10-CM

## 2019-06-28 DIAGNOSIS — R269 Unspecified abnormalities of gait and mobility: Secondary | ICD-10-CM | POA: Diagnosis present

## 2019-06-28 DIAGNOSIS — Y92009 Unspecified place in unspecified non-institutional (private) residence as the place of occurrence of the external cause: Secondary | ICD-10-CM | POA: Diagnosis not present

## 2019-06-28 DIAGNOSIS — Z79899 Other long term (current) drug therapy: Secondary | ICD-10-CM

## 2019-06-28 DIAGNOSIS — Z833 Family history of diabetes mellitus: Secondary | ICD-10-CM | POA: Diagnosis not present

## 2019-06-28 DIAGNOSIS — M6259 Muscle wasting and atrophy, not elsewhere classified, multiple sites: Secondary | ICD-10-CM | POA: Diagnosis not present

## 2019-06-28 DIAGNOSIS — Z20822 Contact with and (suspected) exposure to covid-19: Secondary | ICD-10-CM | POA: Diagnosis not present

## 2019-06-28 DIAGNOSIS — G319 Degenerative disease of nervous system, unspecified: Secondary | ICD-10-CM | POA: Diagnosis present

## 2019-06-28 DIAGNOSIS — Z6841 Body Mass Index (BMI) 40.0 and over, adult: Secondary | ICD-10-CM

## 2019-06-28 DIAGNOSIS — Z7401 Bed confinement status: Secondary | ICD-10-CM | POA: Diagnosis not present

## 2019-06-28 DIAGNOSIS — F5104 Psychophysiologic insomnia: Secondary | ICD-10-CM | POA: Diagnosis present

## 2019-06-28 DIAGNOSIS — Z8249 Family history of ischemic heart disease and other diseases of the circulatory system: Secondary | ICD-10-CM

## 2019-06-28 DIAGNOSIS — M2550 Pain in unspecified joint: Secondary | ICD-10-CM | POA: Diagnosis not present

## 2019-06-28 DIAGNOSIS — W19XXXA Unspecified fall, initial encounter: Secondary | ICD-10-CM | POA: Diagnosis present

## 2019-06-28 DIAGNOSIS — R2689 Other abnormalities of gait and mobility: Secondary | ICD-10-CM | POA: Diagnosis not present

## 2019-06-28 DIAGNOSIS — S065X0D Traumatic subdural hemorrhage without loss of consciousness, subsequent encounter: Secondary | ICD-10-CM | POA: Diagnosis not present

## 2019-06-28 DIAGNOSIS — E039 Hypothyroidism, unspecified: Secondary | ICD-10-CM | POA: Diagnosis not present

## 2019-06-28 DIAGNOSIS — G43909 Migraine, unspecified, not intractable, without status migrainosus: Secondary | ICD-10-CM | POA: Diagnosis present

## 2019-06-28 DIAGNOSIS — Z825 Family history of asthma and other chronic lower respiratory diseases: Secondary | ICD-10-CM

## 2019-06-28 DIAGNOSIS — S4991XA Unspecified injury of right shoulder and upper arm, initial encounter: Secondary | ICD-10-CM | POA: Diagnosis not present

## 2019-06-28 DIAGNOSIS — M6281 Muscle weakness (generalized): Secondary | ICD-10-CM | POA: Diagnosis not present

## 2019-06-28 DIAGNOSIS — F319 Bipolar disorder, unspecified: Secondary | ICD-10-CM | POA: Diagnosis not present

## 2019-06-28 DIAGNOSIS — S065X0A Traumatic subdural hemorrhage without loss of consciousness, initial encounter: Secondary | ICD-10-CM | POA: Diagnosis not present

## 2019-06-28 DIAGNOSIS — S199XXA Unspecified injury of neck, initial encounter: Secondary | ICD-10-CM | POA: Diagnosis not present

## 2019-06-28 DIAGNOSIS — Z79890 Hormone replacement therapy (postmenopausal): Secondary | ICD-10-CM | POA: Diagnosis not present

## 2019-06-28 DIAGNOSIS — Z03818 Encounter for observation for suspected exposure to other biological agents ruled out: Secondary | ICD-10-CM | POA: Diagnosis not present

## 2019-06-28 DIAGNOSIS — R0902 Hypoxemia: Secondary | ICD-10-CM | POA: Diagnosis not present

## 2019-06-28 DIAGNOSIS — R52 Pain, unspecified: Secondary | ICD-10-CM | POA: Diagnosis not present

## 2019-06-28 DIAGNOSIS — Z9884 Bariatric surgery status: Secondary | ICD-10-CM | POA: Diagnosis not present

## 2019-06-28 DIAGNOSIS — Z66 Do not resuscitate: Secondary | ICD-10-CM | POA: Diagnosis not present

## 2019-06-28 DIAGNOSIS — K219 Gastro-esophageal reflux disease without esophagitis: Secondary | ICD-10-CM | POA: Diagnosis present

## 2019-06-28 DIAGNOSIS — M25511 Pain in right shoulder: Secondary | ICD-10-CM | POA: Diagnosis not present

## 2019-06-28 DIAGNOSIS — S065XAA Traumatic subdural hemorrhage with loss of consciousness status unknown, initial encounter: Secondary | ICD-10-CM | POA: Diagnosis present

## 2019-06-28 DIAGNOSIS — S299XXA Unspecified injury of thorax, initial encounter: Secondary | ICD-10-CM | POA: Diagnosis not present

## 2019-06-28 DIAGNOSIS — R262 Difficulty in walking, not elsewhere classified: Secondary | ICD-10-CM

## 2019-06-28 DIAGNOSIS — I6200 Nontraumatic subdural hemorrhage, unspecified: Secondary | ICD-10-CM | POA: Diagnosis not present

## 2019-06-28 DIAGNOSIS — M1990 Unspecified osteoarthritis, unspecified site: Secondary | ICD-10-CM | POA: Diagnosis present

## 2019-06-28 DIAGNOSIS — R05 Cough: Secondary | ICD-10-CM | POA: Diagnosis not present

## 2019-06-28 DIAGNOSIS — R41841 Cognitive communication deficit: Secondary | ICD-10-CM | POA: Diagnosis not present

## 2019-06-28 LAB — CBC WITH DIFFERENTIAL/PLATELET
Abs Immature Granulocytes: 0.02 10*3/uL (ref 0.00–0.07)
Basophils Absolute: 0 10*3/uL (ref 0.0–0.1)
Basophils Relative: 0 %
Eosinophils Absolute: 0.1 10*3/uL (ref 0.0–0.5)
Eosinophils Relative: 1 %
HCT: 38.2 % (ref 36.0–46.0)
Hemoglobin: 11.7 g/dL — ABNORMAL LOW (ref 12.0–15.0)
Immature Granulocytes: 0 %
Lymphocytes Relative: 27 %
Lymphs Abs: 1.5 10*3/uL (ref 0.7–4.0)
MCH: 29.5 pg (ref 26.0–34.0)
MCHC: 30.6 g/dL (ref 30.0–36.0)
MCV: 96.2 fL (ref 80.0–100.0)
Monocytes Absolute: 0.8 10*3/uL (ref 0.1–1.0)
Monocytes Relative: 14 %
Neutro Abs: 3.1 10*3/uL (ref 1.7–7.7)
Neutrophils Relative %: 58 %
Platelets: 165 10*3/uL (ref 150–400)
RBC: 3.97 MIL/uL (ref 3.87–5.11)
RDW: 14.6 % (ref 11.5–15.5)
WBC: 5.4 10*3/uL (ref 4.0–10.5)
nRBC: 0 % (ref 0.0–0.2)

## 2019-06-28 LAB — COMPREHENSIVE METABOLIC PANEL
ALT: 18 U/L (ref 0–44)
AST: 33 U/L (ref 15–41)
Albumin: 3.7 g/dL (ref 3.5–5.0)
Alkaline Phosphatase: 51 U/L (ref 38–126)
Anion gap: 8 (ref 5–15)
BUN: 15 mg/dL (ref 8–23)
CO2: 26 mmol/L (ref 22–32)
Calcium: 8.4 mg/dL — ABNORMAL LOW (ref 8.9–10.3)
Chloride: 108 mmol/L (ref 98–111)
Creatinine, Ser: 0.81 mg/dL (ref 0.44–1.00)
GFR calc Af Amer: >60 mL/min (ref >60)
GFR calc non Af Amer: >60 mL/min (ref >60)
Glucose, Bld: 96 mg/dL (ref 70–99)
Potassium: 3.6 mmol/L (ref 3.5–5.1)
Sodium: 142 mmol/L (ref 135–145)
Total Bilirubin: 0.6 mg/dL (ref 0.3–1.2)
Total Protein: 6 g/dL — ABNORMAL LOW (ref 6.5–8.1)

## 2019-06-28 LAB — CK: Total CK: 340 U/L — ABNORMAL HIGH (ref 38–234)

## 2019-06-28 LAB — URINALYSIS, ROUTINE W REFLEX MICROSCOPIC
Bilirubin Urine: NEGATIVE
Glucose, UA: NEGATIVE mg/dL
Hgb urine dipstick: NEGATIVE
Ketones, ur: NEGATIVE mg/dL
Leukocytes,Ua: NEGATIVE
Nitrite: NEGATIVE
Protein, ur: NEGATIVE mg/dL
Specific Gravity, Urine: >1.046 — ABNORMAL HIGH (ref 1.005–1.030)
pH: 5 (ref 5.0–8.0)

## 2019-06-28 LAB — SARS CORONAVIRUS 2 BY RT PCR (HOSPITAL ORDER, PERFORMED IN ~~LOC~~ HOSPITAL LAB): SARS Coronavirus 2: NEGATIVE

## 2019-06-28 LAB — GLUCOSE, CAPILLARY: Glucose-Capillary: 125 mg/dL — ABNORMAL HIGH (ref 70–99)

## 2019-06-28 LAB — TROPONIN I (HIGH SENSITIVITY)
Troponin I (High Sensitivity): 22 ng/L — ABNORMAL HIGH (ref <18)
Troponin I (High Sensitivity): 22 ng/L — ABNORMAL HIGH (ref <18)

## 2019-06-28 MED ORDER — SODIUM CHLORIDE 0.9 % IV SOLN: INTRAVENOUS | Status: AC

## 2019-06-28 MED ORDER — SODIUM CHLORIDE 0.9 % IV BOLUS
1000.0000 mL | Freq: Once | INTRAVENOUS | Status: AC
  Administered 2019-06-28: 1000 mL via INTRAVENOUS

## 2019-06-28 MED ORDER — DEXAMETHASONE SODIUM PHOSPHATE 10 MG/ML IJ SOLN
8.0000 mg | Freq: Four times a day (QID) | INTRAMUSCULAR | Status: DC
  Administered 2019-06-28 – 2019-06-29 (×3): 8 mg via INTRAVENOUS
  Filled 2019-06-28 (×3): qty 1

## 2019-06-28 MED ORDER — ONDANSETRON HCL 4 MG/2ML IJ SOLN
4.0000 mg | Freq: Once | INTRAMUSCULAR | Status: AC
  Administered 2019-06-28: 4 mg via INTRAVENOUS
  Filled 2019-06-28: qty 2

## 2019-06-28 MED ORDER — LEVETIRACETAM IN NACL 500 MG/100ML IV SOLN
500.0000 mg | Freq: Two times a day (BID) | INTRAVENOUS | Status: DC
  Administered 2019-06-28 – 2019-06-29 (×2): 500 mg via INTRAVENOUS
  Filled 2019-06-28 (×2): qty 100

## 2019-06-28 MED ORDER — MORPHINE SULFATE (PF) 4 MG/ML IV SOLN
4.0000 mg | Freq: Once | INTRAVENOUS | Status: AC
  Administered 2019-06-28: 4 mg via INTRAVENOUS
  Filled 2019-06-28: qty 1

## 2019-06-28 MED ORDER — ONDANSETRON HCL 4 MG PO TABS: 4.0000 mg | ORAL_TABLET | Freq: Four times a day (QID) | ORAL | Status: DC | PRN

## 2019-06-28 MED ORDER — IOHEXOL 300 MG/ML  SOLN
75.0000 mL | Freq: Once | INTRAMUSCULAR | Status: AC | PRN
  Administered 2019-06-28: 75 mL via INTRAVENOUS

## 2019-06-28 MED ORDER — ONDANSETRON HCL 4 MG/2ML IJ SOLN
4.0000 mg | Freq: Four times a day (QID) | INTRAMUSCULAR | Status: DC | PRN
  Administered 2019-06-30 – 2019-07-01 (×2): 4 mg via INTRAVENOUS
  Filled 2019-06-28 (×2): qty 2

## 2019-06-28 MED ORDER — PANTOPRAZOLE SODIUM 40 MG IV SOLR
40.0000 mg | Freq: Two times a day (BID) | INTRAVENOUS | Status: DC
  Administered 2019-06-28 – 2019-06-29 (×2): 40 mg via INTRAVENOUS
  Filled 2019-06-28 (×2): qty 40

## 2019-06-28 MED ORDER — LEVOTHYROXINE SODIUM 100 MCG/5ML IV SOLN: 37.5000 ug | Freq: Every day | INTRAVENOUS | Status: DC

## 2019-06-28 NOTE — H&P (Signed)
History and Physical    Lindsey Rowe GLO:[id number] DOB: January 15, 1946 DOA: 06/28/2019  PCP: Maurice Small, MD   Patient coming from: Home.  Chief Complaint: Fall.  HPI: Lindsey Rowe is a 74 y.o. female with history of bipolar disorder, hypertension, hypothyroidism had a fall at home about 48 hours ago and was unable to get up.  Patient states she might have had multiple falls.  Eventually she was able to crawl and get in contact with the neighbors.  Patient was brought to the ER.  Patient states she had no loss consciousness throughout this process.  Denies any chest pain or shortness of breath has been Low back pain and headache which has been global.  ED Course: In the ER CT head shows large right-sided subdural hematoma with midline shift for which neurosurgery was consulted.  X-rays were largely unremarkable but on exam patient does have right temporal ecchymosis and also ecchymosis in the right shoulder.  EKG shows normal sinus rhythm with low voltage.  Labs show CK levels are mildly elevated at 340.  Hemoglobin 11.7 Covid test was negative.  Patient still complains of mild low back pain x-ray of the lumbar spine and pelvis was unremarkable.  Patient is able to move all extremities.  Review of Systems: As per HPI, rest all negative.   Past Medical History:  Diagnosis Date  . Arthritis   . Back pain   . Bipolar 1 disorder (HCC)   . Complication of anesthesia    hard to wake up anesthesia  . Depression   . GERD (gastroesophageal reflux disease)   . Headache(784.0)    migraines and tension headaches  . Hyperlipidemia   . Hypertension   . Hypothyroidism   . Thyroid disease     Past Surgical History:  Procedure Laterality Date  . APPENDECTOMY    . BACK SURGERY    . EYE SURGERY Bilateral    lens implant  . GASTRIC BYPASS    . LOOP RECORDER INSERTION N/A 05/25/2016   Procedure: Loop Recorder Insertion;  Surgeon: Will Jorja Loa, MD;  Location: [facility];   Service: Cardiovascular;  Laterality: N/A;  . LUMBAR LAMINECTOMY/DECOMPRESSION MICRODISCECTOMY Right 07/23/2012   Procedure: LUMBAR LAMINECTOMY/DECOMPRESSION MICRODISCECTOMY 2 LEVELS;  Surgeon: Karn Cassis, MD;  Location: [facility]UR[facility];  Service: Neurosurgery;  Laterality: Right;  Right Lumbar three-four  lumbar four-five Laminectomy/Foraminotomy  . NASAL SINUS SURGERY Bilateral   . TONSILLECTOMY    . WRIST SURGERY       reports that she has never smoked. She has never used smokeless tobacco. She reports current alcohol use. She reports that she does not use drugs.  Allergies  Allergen Reactions  . Gabapentin Other (See Comments)    Right foot and leg swelled   . Cefadroxil Itching    Ends of hair itched     Family History  Problem Relation Age of Onset  . Heart disease Mother   . Emphysema Father   . Diabetes Sister   . Other Brother        Brain tumor - s/p excision    Prior to Admission medications   Medication Sig Start Date End Date Taking? Authorizing Provider  albuterol (ACCUNEB) 1.25 MG/3ML nebulizer solution Take 3 mLs (1.25 mg total) by nebulization every 6 (six) hours as needed for up to 5 days for wheezing. 10/09/18 02/13/19  Margie Ege A, DO  albuterol (PROVENTIL HFA;VENTOLIN HFA) 108 (90 Base) MCG/ACT inhaler Inhale 2 puffs into  the lungs every 6 (six) hours as needed for wheezing or shortness of breath. Patient not taking: Reported on 02/13/2019 04/07/18   Charlynne Pander, MD  Carboxymethylcellulose Sodium (EYE DROPS OP) Apply 1 drop to eye daily as needed (dry eyes).    [provider]  doxycycline (VIBRAMYCIN) 100 MG capsule Take 1 capsule (100 mg total) by mouth 2 (two) times daily. Patient not taking: Reported on 02/13/2019 11/17/18   Glynn Octave, MD  DULoxetine (CYMBALTA) 30 MG capsule Take 1 capsule (30 mg total) by mouth 2 (two) times daily. Patient taking differently: Take 30-60 mg by mouth See admin instructions. 30 MG in the am and 60 MG at  night 05/26/16   Pearson Grippe, MD  fluticasone [facility]) 50 MCG/ACT nasal spray Place 1 spray into both nostrils daily as needed for allergies or rhinitis. 06/29/17   Rai, Delene Ruffini, MD  HYDROcodone-acetaminophen (NORCO/VICODIN) 5-325 MG tablet Take 1 tablet by mouth every 6 (six) hours as needed for moderate pain. 02/13/19   Bethann Berkshire, MD  levothyroxine (SYNTHROID, LEVOTHROID) 75 MCG tablet Take 75 mcg by mouth daily before breakfast.  09/06/15   [provider]  lisinopril (PRINIVIL,ZESTRIL) 10 MG tablet Take 10 mg by mouth daily.  08/15/16   [provider]  loperamide (IMODIUM) 2 MG capsule Take 1 capsule (2 mg total) by mouth 3 (three) times daily as needed for diarrhea or loose stools. 07/02/17   Rai, Ripudeep K, MD  LORazepam (ATIVAN) 1 MG tablet Take 1 mg by mouth 2 (two) times daily as needed for anxiety. 12/25/18   [provider]  meloxicam (MOBIC) 15 MG tablet Take 15 mg by mouth daily as needed for pain.    [provider]  methocarbamol (ROBAXIN) 750 MG tablet Take 750 mg by mouth 3 (three) times daily. 12/19/18   [provider]  metoprolol succinate (TOPROL-XL) 50 MG 24 hr tablet Take 1 tablet (50 mg total) by mouth daily. Take with or immediately following a meal. 05/26/18   Camnitz, Andree Coss, MD  omeprazole (PRILOSEC) 20 MG capsule Take 20 mg by mouth 2 (two) times daily before a meal.    [provider]  pravastatin (PRAVACHOL) 40 MG tablet Take 40 mg by mouth daily.    [provider]  pregabalin (LYRICA) 100 MG capsule Take 1 capsule (100 mg total) by mouth 2 (two) times daily. 09/27/15   Marlin Canary U, DO  RESTASIS 0.05 % ophthalmic emulsion Place 1 drop into both eyes 2 (two) times daily.  02/19/18   [provider]  vitamin B-12 (CYANOCOBALAMIN) 1000 MCG tablet Take 1,000 mcg by mouth daily.    [provider]  Vitamin D, Ergocalciferol, (DRISDOL) 1.25 MG (50000 UT) CAPS capsule Take 50,000 Units by  mouth once a week. 03/03/18   [provider]    Physical Exam: Constitutional: Moderately built and nourished. Vitals:   06/28/19 2030 06/28/19 2045 06/28/19 2100 06/28/19 2115  BP: 135/75     Pulse: 70 82 72 74  Resp: 15 15 17 17   SpO2: 100% 96% 100% 100%  Weight:      Height:       Eyes: Anicteric no pallor. ENMT: No discharge from the ears eyes nose or mouth. Neck: No mass felt.  No neck rigidity. Respiratory: No rhonchi or crepitations. Cardiovascular: S1-S2 heard. Abdomen: Soft nontender bowel sounds present. Musculoskeletal: No edema. Skin: No rash. Neurologic: Alert awake oriented to time place and person.  Moves all extremities.  Psychiatric: Appears normal.  Normal affect.   Labs on Admission: I have personally reviewed following labs and imaging studies  CBC: Recent Labs  Lab 06/28/19 1813  WBC 5.4  NEUTROABS 3.1  HGB 11.7*  HCT 38.2  MCV 96.2  PLT 165   Basic Metabolic Panel: Recent Labs  Lab 06/28/19 1813  NA 142  K 3.6  CL 108  CO2 26  GLUCOSE 96  BUN 15  CREATININE 0.81  CALCIUM 8.4*   GFR: Estimated Creatinine Clearance: 73.4 mL/min (by C-G formula based on SCr of 0.81 mg/dL). Liver Function Tests: Recent Labs  Lab 06/28/19 1813  AST 33  ALT 18  ALKPHOS 51  BILITOT 0.6  PROT 6.0*  ALBUMIN 3.7   No results for input(s): LIPASE, AMYLASE in the last 168 hours. No results for input(s): AMMONIA in the last 168 hours. Coagulation Profile: No results for input(s): INR, PROTIME in the last 168 hours. Cardiac Enzymes: Recent Labs  Lab 06/28/19 1813  CKTOTAL 340*   BNP (last 3 results) No results for input(s): PROBNP in the last 8760 hours. HbA1C: No results for input(s): HGBA1C in the last 72 hours. CBG: No results for input(s): GLUCAP in the last 168 hours. Lipid Profile: No results for input(s): CHOL, HDL, LDLCALC, TRIG, CHOLHDL, LDLDIRECT in the last 72 hours. Thyroid Function Tests: No results for input(s): TSH,  T4TOTAL, FREET4, T3FREE, THYROIDAB in the last 72 hours. Anemia Panel: No results for input(s): VITAMINB12, FOLATE, FERRITIN, TIBC, IRON, RETICCTPCT in the last 72 hours. Urine analysis:    Component Value Date/Time   COLORURINE YELLOW 06/27/2017 0613   APPEARANCEUR HAZY (A) 06/27/2017 0613   LABSPEC 1.019 06/27/2017 0613   PHURINE 8.0 06/27/2017 0613   GLUCOSEU NEGATIVE 06/27/2017 0613   HGBUR SMALL (A) 06/27/2017 0613   HGBUR trace-lysed 09/23/2009 0000   BILIRUBINUR NEGATIVE 06/27/2017 0613   KETONESUR NEGATIVE 06/27/2017 0613   PROTEINUR NEGATIVE 06/27/2017 0613   UROBILINOGEN 1.0 08/13/2012 1757   NITRITE NEGATIVE 06/27/2017 0613   LEUKOCYTESUR MODERATE (A) 06/27/2017 0613   Sepsis Labs: (procalcitonin:4,lacticidven:4) ) Recent Results (from the past 240 hour(s))  SARS Coronavirus 2 by RT PCR (hospital order, performed in [facility] hospital lab) Nasopharyngeal Nasopharyngeal Swab     Status: None   Collection Time: 06/28/19  7:36 PM   Specimen: Nasopharyngeal Swab  Result Value Ref Range Status   SARS Coronavirus 2 NEGATIVE NEGATIVE Final    Comment: (NOTE) SARS-CoV-2 target nucleic acids are NOT DETECTED. The SARS-CoV-2 RNA is generally detectable in upper and lower respiratory specimens during the acute phase of infection. The lowest concentration of SARS-CoV-2 viral copies this assay can detect is 250 copies / mL. A negative result does not preclude SARS-CoV-2 infection and should not be used as the sole basis for treatment or other patient management decisions.  A negative result may occur with improper specimen collection / handling, submission of specimen other than nasopharyngeal swab, presence of viral mutation(s) within the areas targeted by this assay, and inadequate number of viral copies (<250 copies / mL). A negative result must be combined with clinical observations, patient history, and epidemiological information. Fact Sheet for Patients:     BoilerBrush.com.cy Fact Sheet for Healthcare Providers: https://pope.com/ This test is not yet approved or cleared  by the [place] FDA and has been authorized for detection and/or diagnosis of SARS-CoV-2 by FDA under an Emergency Use Authorization (EUA).  This EUA will remain in effect (meaning this test can be used) for  the duration of the COVID-19 declaration under Section 564(b)(1) of the Act, 21 U.S.C. section 360bbb-3(b)(1), unless the authorization is terminated or revoked sooner. Performed at [facility], [address], [place], [place] [address]      Radiological Exams on Admission: DG Chest 2 View  Result Date: 06/28/2019 CLINICAL DATA:  Fall.  Pain. EXAM: CHEST - 2 VIEW COMPARISON:  None. FINDINGS: Healed right rib fractures are identified. There is a rounded density adjacent to the most inferior healed right rib fracture which is less prominent compared October 2020. There is a rounded density in the left lung new since October 2020. No pneumothorax. The cardiomediastinal silhouette is normal. No other acute abnormalities. IMPRESSION: 1. The rounded ill-defined density in the left mid lung new since October 2020 may represent infiltrate or nodule/neoplasm. If the patient does not have signs of infection, recommend CT imaging. If the patient has signs of infection, recommend treatment with short-term follow-up imaging in 2 or 3 weeks. 2. Healed right rib fractures, unchanged since October 2020. No pneumothorax. 3. Mildly rounded density in the right mid lung is improved since October 2020 and was likely sequela of the previous adjacent rib fracture. Electronically Signed   By: Gerome Sam III M.D   On: 06/28/2019 18:02   DG Lumbar Spine Complete  Result Date: 06/28/2019 CLINICAL DATA:  Pain after fall EXAM: LUMBAR SPINE - COMPLETE 4+ VIEW COMPARISON:  October 08, 2013 FINDINGS: Scoliotic curvature lumbar spine  persist. Multilevel degenerative disc disease and lower lumbar facet degenerative changes. Scalloping of the superior endplate of L4 is similar since 2015. Scalloping of the inferior endplate of L3 is similar. The configuration of L2 is similar as well. No traumatic malalignment. Mild calcified atherosclerosis in the distal abdominal aorta. No other abnormalities. IMPRESSION: No acute fracture or traumatic malalignment. Calcified atherosclerosis in the distal abdominal aorta. Degenerative changes as above. Electronically Signed   By: Gerome Sam III M.D   On: 06/28/2019 18:06   DG Pelvis 1-2 Views  Result Date: 06/28/2019 CLINICAL DATA:  Pain after fall EXAM: PELVIS - 1-2 VIEW COMPARISON:  None. FINDINGS: There is no evidence of pelvic fracture or diastasis. No pelvic bone lesions are seen. IMPRESSION: Negative. Electronically Signed   By: Gerome Sam III M.D   On: 06/28/2019 18:07   DG Shoulder Right  Result Date: 06/28/2019 CLINICAL DATA:  Pain after fall EXAM: RIGHT SHOULDER - 2+ VIEW COMPARISON:  None. FINDINGS: Deformity of the right humeral head is unchanged since comparison studies and nonacute. This is likely sequela of previous trauma. No acute fracture or dislocation. Healed right rib fractures. High riding humeral head suggesting rotator cuff tear, likely nonacute. No other acute abnormalities. IMPRESSION: No acute abnormalities identified. High riding humeral head suggesting rotator cuff tear, nonacute. Deformity of the right humeral head, nonacute, likely from previous trauma. Electronically Signed   By: Gerome Sam III M.D   On: 06/28/2019 18:04   CT Head Wo Contrast  Result Date: 06/28/2019 CLINICAL DATA:  [place], headache EXAM: CT HEAD WITHOUT CONTRAST CT CERVICAL SPINE WITHOUT CONTRAST TECHNIQUE: Multidetector CT imaging of the head and cervical spine was performed following the standard protocol without intravenous contrast. Multiplanar CT image reconstructions of the cervical  spine were also generated. COMPARISON:  10/07/2018 FINDINGS: CT HEAD FINDINGS Brain: There is a large right-sided subdural hematoma extending along the entire right cerebral hemisphere. Subdural hematoma measures up to 14 mm in greatest thickness. There is mass effect upon the right  cerebral hemisphere with sulcal effacement and leftward midline shift measuring 7 mm at the level of the septum pellucidum. Small subdural hematomas are seen along the posterior falx as well, measuring up to 3 mm in thickness. I do not see any acute infarct. Lateral ventricles are otherwise unremarkable. Vascular: No hyperdense vessel or unexpected calcification. Skull: Normal. Negative for fracture or focal lesion. Sinuses/Orbits: No acute finding. Other: None CT CERVICAL SPINE FINDINGS Alignment: Alignment is grossly anatomic. Skull base and vertebrae: No acute displaced cervical spine fractures. Soft tissues and spinal canal: No prevertebral fluid or swelling. No visible canal hematoma. Disc levels: There is multilevel cervical spondylosis, with disc space narrowing and osteophyte formation most pronounced at C3-4, C5-6, and C6-7. Mild facet hypertrophy at C4-5. There is mild symmetrical neural foraminal encroachment at C3-4. Upper chest: Airway is patent.  Lung apices are clear. Other: Reconstructed images demonstrate no additional findings. IMPRESSION: CT head: 1. Large right-sided subdural hematoma measuring up to 14 mm in thickness. Mass effect on the right cerebral hemisphere, with leftward midline shift measuring 7 mm. 2. Small subdural hematomas along the posterior falx measuring up to 3 mm in thickness. CT cervical spine: 1. No acute cervical spine fracture. 2. Multilevel spondylosis greatest at C3-4. These results were called by telephone at the time of interpretation on 06/28/2019 at 7:41 pm to provider [facility] , who verbally acknowledged these results. Electronically Signed   By: Randa Ngo M.D.   On: 06/28/2019  19:40   CT Chest W Contrast  Result Date: 06/28/2019 CLINICAL DATA:  [place] yesterday, intracranial hemorrhage EXAM: CT CHEST WITH CONTRAST TECHNIQUE: Multidetector CT imaging of the chest was performed during intravenous contrast administration. CONTRAST:  62mL OMNIPAQUE IOHEXOL 300 MG/ML  SOLN COMPARISON:  11/17/2018 FINDINGS: Cardiovascular: The heart and great vessels are unremarkable without pericardial effusion. Atherosclerosis of the coronary vasculature greatest in the LAD distribution. There is stable aneurysmal dilatation of the ascending thoracic aorta, measuring up to 4.4 cm. No evidence of dissection. Mediastinum/Nodes: No enlarged mediastinal, hilar, or axillary lymph nodes. Thyroid gland, trachea, and esophagus demonstrate no significant findings. Lungs/Pleura: No airspace disease, effusion, or pneumothorax. Central airways are patent. Upper Abdomen: Stable postsurgical changes from prior gastric surgery. No acute upper abdominal findings. Musculoskeletal: There are prior healed bilateral rib fractures. No acute displaced fractures. Reconstructed images demonstrate no additional findings. IMPRESSION: 1. No acute intrathoracic process. 2. 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 Recommend annual imaging followup by CTA or MRA. This recommendation follows 2010 ACCF/AHA/AATS/ACR/ASA/SCA/SCAI/SIR/STS/SVM Guidelines for the Diagnosis and Management of Patients with Thoracic Aortic Disease. Circulation. 2010; 121: J825-K539. Aortic aneurysm NOS (ICD10-I71.9) 3. Atherosclerosis of the coronary vasculature greatest in the LAD distribution. Electronically Signed   By: Randa Ngo M.D.   On: 06/28/2019 19:40   CT Cervical Spine Wo Contrast  Result Date: 06/28/2019 CLINICAL DATA:  [place], headache EXAM: CT HEAD WITHOUT CONTRAST CT CERVICAL SPINE WITHOUT CONTRAST TECHNIQUE: Multidetector CT imaging of the head and cervical spine was performed following the  standard protocol without intravenous contrast. Multiplanar CT image reconstructions of the cervical spine were also generated. COMPARISON:  10/07/2018 FINDINGS: CT HEAD FINDINGS Brain: There is a large right-sided subdural hematoma extending along the entire right cerebral hemisphere. Subdural hematoma measures up to 14 mm in greatest thickness. There is mass effect upon the right cerebral hemisphere with sulcal effacement and leftward midline shift measuring 7 mm at the level of the septum pellucidum. Small subdural hematomas  are seen along the posterior falx as well, measuring up to 3 mm in thickness. I do not see any acute infarct. Lateral ventricles are otherwise unremarkable. Vascular: No hyperdense vessel or unexpected calcification. Skull: Normal. Negative for fracture or focal lesion. Sinuses/Orbits: No acute finding. Other: None CT CERVICAL SPINE FINDINGS Alignment: Alignment is grossly anatomic. Skull base and vertebrae: No acute displaced cervical spine fractures. Soft tissues and spinal canal: No prevertebral fluid or swelling. No visible canal hematoma. Disc levels: There is multilevel cervical spondylosis, with disc space narrowing and osteophyte formation most pronounced at C3-4, C5-6, and C6-7. Mild facet hypertrophy at C4-5. There is mild symmetrical neural foraminal encroachment at C3-4. Upper chest: Airway is patent.  Lung apices are clear. Other: Reconstructed images demonstrate no additional findings. IMPRESSION: CT head: 1. Large right-sided subdural hematoma measuring up to 14 mm in thickness. Mass effect on the right cerebral hemisphere, with leftward midline shift measuring 7 mm. 2. Small subdural hematomas along the posterior falx measuring up to 3 mm in thickness. CT cervical spine: 1. No acute cervical spine fracture. 2. Multilevel spondylosis greatest at C3-4. These results were called by telephone at the time of interpretation on 06/28/2019 at 7:41 pm to provider [facility] HAVILAND , who  verbally acknowledged these results. Electronically Signed   By: Sharlet SalinaMichael  Brown M.D.   On: 06/28/2019 19:40    EKG: Independently reviewed.  Normal sinus rhythm low voltage.  Assessment/Plan Principal Problem:   Subdural hematoma (HCC) Active Problems:   BIPOLAR AFFECTIVE DISORDER   HYPERTENSION, BENIGN ESSENTIAL   Hypothyroid    1. Right-sided subdural hematoma with midline shift for which no neurosurgeon Dr. Wynetta Emeryram has been consulted patient is started on empirically IV Keppra Decadron and will be kept n.p.o. in anticipation of surgery. 2. History of hypertension on presentation was hypotensive for which we will hold off antihypertensives and continue with gentle hydration.  Patient did receive 1 L normal saline bolus in the ER.  As needed IV hydralazine for systolic more than 160. 3. History of hypothyroidism we will dose Synthroid IV for now. 4. Anemia follow CBC. 5. History of hyperlipidemia statins to be started after surgery. 6. History of bipolar disorder home medicines to be restarted after surgery.  Since patient has subdural hematoma will need more than 2 midnight stay in inpatient status.   DVT prophylaxis: SCDs.  Since patient has subdural hematoma avoiding pharmacological DVT. Code Status: DNR as confirmed with patient. Family Communication: Patient states she has no directives. Disposition Plan: May need rehab. Consults called: Neurosurgery. Admission status: Inpatient.   Eduard ClosArshad N Marvine Encalade MD [facility] Pager [phone].  If 7PM-7AM, please contact night-coverage [email or web address] Password [email or web address]  06/28/2019, 9:54 PM

## 2019-06-28 NOTE — ED Notes (Signed)
Pt trans[oorted to CT

## 2019-06-28 NOTE — Consult Note (Signed)
Reason for Consult:sdh Referring Physician: edp  Lindsey Rowe is an 74 y.o. female.   HPI:  74 year old female fell yesterday afternoon. She had a difficult time standing up and therefore slept on the floor overnight until she could get to her neighbor. She has generalized weakness in her legs at baseline. She endorses a headache but no NV or dizziness or blurry vision. Has an extensive medical history as well.   Past Medical History:  Diagnosis Date  . Arthritis   . Back pain   . Bipolar 1 disorder (HCC)   . Complication of anesthesia    hard to wake up anesthesia  . Depression   . GERD (gastroesophageal reflux disease)   . Headache(784.0)    migraines and tension headaches  . Hyperlipidemia   . Hypertension   . Hypothyroidism   . Thyroid disease     Past Surgical History:  Procedure Laterality Date  . APPENDECTOMY    . BACK SURGERY    . EYE SURGERY Bilateral    lens implant  . GASTRIC BYPASS    . LOOP RECORDER INSERTION N/A 05/25/2016   Procedure: Loop Recorder Insertion;  Surgeon: Will Jorja Loa, MD;  Location: [facility];  Service: Cardiovascular;  Laterality: N/A;  . LUMBAR LAMINECTOMY/DECOMPRESSION MICRODISCECTOMY Right 07/23/2012   Procedure: LUMBAR LAMINECTOMY/DECOMPRESSION MICRODISCECTOMY 2 LEVELS;  Surgeon: Karn Cassis, MD;  Location: [facility];  Service: Neurosurgery;  Laterality: Right;  Right Lumbar three-four  lumbar four-five Laminectomy/Foraminotomy  . NASAL SINUS SURGERY Bilateral   . TONSILLECTOMY    . WRIST SURGERY      Allergies  Allergen Reactions  . Gabapentin Other (See Comments)    Right foot and leg swelled   . Cefadroxil Itching    Ends of hair itched     Social History   Tobacco Use  . Smoking status: Never Smoker  . Smokeless tobacco: Never Used  Substance Use Topics  . Alcohol use: Yes    Comment: social    Family History  Problem Relation Age of Onset  . Heart disease Mother   . Emphysema Father   .  Diabetes Sister   . Other Brother        Brain tumor - s/p excision     Review of Systems  Positive ROS: as above  All other systems have been reviewed and were otherwise negative with the exception of those mentioned in the HPI and as above.  Objective: Vital signs in last 24 hours: Pulse Rate:  [66-88] 68 (05/23 1952) Resp:  [15-24] 17 (05/23 2000) BP: (124-151)/(61-115) 138/61 (05/23 2000) SpO2:  [97 %-100 %] 100 % (05/23 1952) Weight:  [106.6 kg] 106.6 kg (05/23 1658)  General Appearance: Alert, cooperative, no distress, appears stated age Head: Normocephalic, without obvious abnormality, atraumatic Eyes: PERRL, conjunctiva/corneas clear, EOM's intact, fundi benign, both eyes, left pupil not round but this is her baseline.      Lungs:  respirations unlabored Heart: Regular rate and rhythm Extremities: Extremities normal, atraumatic, no cyanosis or edema Pulses: 2+ and symmetric all extremities Skin: Skin color, texture, turgor normal, no rashes or lesions  NEUROLOGIC:   Mental status: A&O x4, no aphasia, good attention span, Memory and fund of knowledge Motor Exam - grossly normal, normal tone and bulk Sensory Exam - grossly normal Reflexes: symmetric, no pathologic reflexes, No Hoffman's, No clonus Coordination - grossly normal Gait -not tested Balance - not tested Cranial Nerves: I: smell Not tested  II:  visual acuity  OS: na    OD: na  II: visual fields Full to confrontation  II: pupils Equal, right round, reactive to light, left mishaped  III,VII: ptosis None  III,IV,VI: extraocular muscles  Full ROM  V: mastication Normal  V: facial light touch sensation  Normal  V,VII: corneal reflex  Present  VII: facial muscle function - upper  Normal  VII: facial muscle function - lower Normal  VIII: hearing Not tested  IX: soft palate elevation  Normal  IX,X: gag reflex Present  XI: trapezius strength  5/5  XI: sternocleidomastoid strength 5/5  XI: neck flexion  strength  5/5  XII: tongue strength  Normal    Data Review Lab Results  Component Value Date   WBC 5.4 06/28/2019   HGB 11.7 (L) 06/28/2019   HCT 38.2 06/28/2019   MCV 96.2 06/28/2019   PLT 165 06/28/2019   Lab Results  Component Value Date   NA 142 06/28/2019   K 3.6 06/28/2019   CL 108 06/28/2019   CO2 26 06/28/2019   BUN 15 06/28/2019   CREATININE 0.81 06/28/2019   GLUCOSE 96 06/28/2019   Lab Results  Component Value Date   INR 0.98 06/09/2017    Radiology: DG Chest 2 View  Result Date: 06/28/2019 CLINICAL DATA:  Fall.  Pain. EXAM: CHEST - 2 VIEW COMPARISON:  None. FINDINGS: Healed right rib fractures are identified. There is a rounded density adjacent to the most inferior healed right rib fracture which is less prominent compared October 2020. There is a rounded density in the left lung new since October 2020. No pneumothorax. The cardiomediastinal silhouette is normal. No other acute abnormalities. IMPRESSION: 1. The rounded ill-defined density in the left mid lung new since October 2020 may represent infiltrate or nodule/neoplasm. If the patient does not have signs of infection, recommend CT imaging. If the patient has signs of infection, recommend treatment with short-term follow-up imaging in 2 or 3 weeks. 2. Healed right rib fractures, unchanged since October 2020. No pneumothorax. 3. Mildly rounded density in the right mid lung is improved since October 2020 and was likely sequela of the previous adjacent rib fracture. Electronically Signed   By: Gerome Sam III M.D   On: 06/28/2019 18:02   DG Lumbar Spine Complete  Result Date: 06/28/2019 CLINICAL DATA:  Pain after fall EXAM: LUMBAR SPINE - COMPLETE 4+ VIEW COMPARISON:  October 08, 2013 FINDINGS: Scoliotic curvature lumbar spine persist. Multilevel degenerative disc disease and lower lumbar facet degenerative changes. Scalloping of the superior endplate of L4 is similar since 2015. Scalloping of the inferior endplate  of L3 is similar. The configuration of L2 is similar as well. No traumatic malalignment. Mild calcified atherosclerosis in the distal abdominal aorta. No other abnormalities. IMPRESSION: No acute fracture or traumatic malalignment. Calcified atherosclerosis in the distal abdominal aorta. Degenerative changes as above. Electronically Signed   By: Gerome Sam III M.D   On: 06/28/2019 18:06   DG Pelvis 1-2 Views  Result Date: 06/28/2019 CLINICAL DATA:  Pain after fall EXAM: PELVIS - 1-2 VIEW COMPARISON:  None. FINDINGS: There is no evidence of pelvic fracture or diastasis. No pelvic bone lesions are seen. IMPRESSION: Negative. Electronically Signed   By: Gerome Sam III M.D   On: 06/28/2019 18:07   DG Shoulder Right  Result Date: 06/28/2019 CLINICAL DATA:  Pain after fall EXAM: RIGHT SHOULDER - 2+ VIEW COMPARISON:  None. FINDINGS: Deformity of the right humeral head is unchanged since comparison studies  and nonacute. This is likely sequela of previous trauma. No acute fracture or dislocation. Healed right rib fractures. High riding humeral head suggesting rotator cuff tear, likely nonacute. No other acute abnormalities. IMPRESSION: No acute abnormalities identified. High riding humeral head suggesting rotator cuff tear, nonacute. Deformity of the right humeral head, nonacute, likely from previous trauma. Electronically Signed   By: Gerome Sam III M.D   On: 06/28/2019 18:04   CT Head Wo Contrast  Result Date: 06/28/2019 CLINICAL DATA:  Larey Seat, headache EXAM: CT HEAD WITHOUT CONTRAST CT CERVICAL SPINE WITHOUT CONTRAST TECHNIQUE: Multidetector CT imaging of the head and cervical spine was performed following the standard protocol without intravenous contrast. Multiplanar CT image reconstructions of the cervical spine were also generated. COMPARISON:  10/07/2018 FINDINGS: CT HEAD FINDINGS Brain: There is a large right-sided subdural hematoma extending along the entire right cerebral hemisphere.  Subdural hematoma measures up to 14 mm in greatest thickness. There is mass effect upon the right cerebral hemisphere with sulcal effacement and leftward midline shift measuring 7 mm at the level of the septum pellucidum. Small subdural hematomas are seen along the posterior falx as well, measuring up to 3 mm in thickness. I do not see any acute infarct. Lateral ventricles are otherwise unremarkable. Vascular: No hyperdense vessel or unexpected calcification. Skull: Normal. Negative for fracture or focal lesion. Sinuses/Orbits: No acute finding. Other: None CT CERVICAL SPINE FINDINGS Alignment: Alignment is grossly anatomic. Skull base and vertebrae: No acute displaced cervical spine fractures. Soft tissues and spinal canal: No prevertebral fluid or swelling. No visible canal hematoma. Disc levels: There is multilevel cervical spondylosis, with disc space narrowing and osteophyte formation most pronounced at C3-4, C5-6, and C6-7. Mild facet hypertrophy at C4-5. There is mild symmetrical neural foraminal encroachment at C3-4. Upper chest: Airway is patent.  Lung apices are clear. Other: Reconstructed images demonstrate no additional findings. IMPRESSION: CT head: 1. Large right-sided subdural hematoma measuring up to 14 mm in thickness. Mass effect on the right cerebral hemisphere, with leftward midline shift measuring 7 mm. 2. Small subdural hematomas along the posterior falx measuring up to 3 mm in thickness. CT cervical spine: 1. No acute cervical spine fracture. 2. Multilevel spondylosis greatest at C3-4. These results were called by telephone at the time of interpretation on 06/28/2019 at 7:41 pm to provider [facility] , who verbally acknowledged these results. Electronically Signed   By: Sharlet Salina M.D.   On: 06/28/2019 19:40   CT Chest W Contrast  Result Date: 06/28/2019 CLINICAL DATA:  Larey Seat yesterday, intracranial hemorrhage EXAM: CT CHEST WITH CONTRAST TECHNIQUE: Multidetector CT imaging of the  chest was performed during intravenous contrast administration. CONTRAST:  59mL OMNIPAQUE IOHEXOL 300 MG/ML  SOLN COMPARISON:  11/17/2018 FINDINGS: Cardiovascular: The heart and great vessels are unremarkable without pericardial effusion. Atherosclerosis of the coronary vasculature greatest in the LAD distribution. There is stable aneurysmal dilatation of the ascending thoracic aorta, measuring up to 4.4 cm. No evidence of dissection. Mediastinum/Nodes: No enlarged mediastinal, hilar, or axillary lymph nodes. Thyroid gland, trachea, and esophagus demonstrate no significant findings. Lungs/Pleura: No airspace disease, effusion, or pneumothorax. Central airways are patent. Upper Abdomen: Stable postsurgical changes from prior gastric surgery. No acute upper abdominal findings. Musculoskeletal: There are prior healed bilateral rib fractures. No acute displaced fractures. Reconstructed images demonstrate no additional findings. IMPRESSION: 1. No acute intrathoracic process. 2. 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 Recommend annual imaging followup by CTA or MRA. This  recommendation follows 2010 ACCF/AHA/AATS/ACR/ASA/SCA/SCAI/SIR/STS/SVM Guidelines for the Diagnosis and Management of Patients with Thoracic Aortic Disease. Circulation. 2010; 121: Z025-E527. Aortic aneurysm NOS (ICD10-I71.9) 3. Atherosclerosis of the coronary vasculature greatest in the LAD distribution. Electronically Signed   By: Randa Ngo M.D.   On: 06/28/2019 19:40   CT Cervical Spine Wo Contrast  Result Date: 06/28/2019 CLINICAL DATA:  [place], headache EXAM: CT HEAD WITHOUT CONTRAST CT CERVICAL SPINE WITHOUT CONTRAST TECHNIQUE: Multidetector CT imaging of the head and cervical spine was performed following the standard protocol without intravenous contrast. Multiplanar CT image reconstructions of the cervical spine were also generated. COMPARISON:  10/07/2018 FINDINGS: CT HEAD FINDINGS Brain:  There is a large right-sided subdural hematoma extending along the entire right cerebral hemisphere. Subdural hematoma measures up to 14 mm in greatest thickness. There is mass effect upon the right cerebral hemisphere with sulcal effacement and leftward midline shift measuring 7 mm at the level of the septum pellucidum. Small subdural hematomas are seen along the posterior falx as well, measuring up to 3 mm in thickness. I do not see any acute infarct. Lateral ventricles are otherwise unremarkable. Vascular: No hyperdense vessel or unexpected calcification. Skull: Normal. Negative for fracture or focal lesion. Sinuses/Orbits: No acute finding. Other: None CT CERVICAL SPINE FINDINGS Alignment: Alignment is grossly anatomic. Skull base and vertebrae: No acute displaced cervical spine fractures. Soft tissues and spinal canal: No prevertebral fluid or swelling. No visible canal hematoma. Disc levels: There is multilevel cervical spondylosis, with disc space narrowing and osteophyte formation most pronounced at C3-4, C5-6, and C6-7. Mild facet hypertrophy at C4-5. There is mild symmetrical neural foraminal encroachment at C3-4. Upper chest: Airway is patent.  Lung apices are clear. Other: Reconstructed images demonstrate no additional findings. IMPRESSION: CT head: 1. Large right-sided subdural hematoma measuring up to 14 mm in thickness. Mass effect on the right cerebral hemisphere, with leftward midline shift measuring 7 mm. 2. Small subdural hematomas along the posterior falx measuring up to 3 mm in thickness. CT cervical spine: 1. No acute cervical spine fracture. 2. Multilevel spondylosis greatest at C3-4. These results were called by telephone at the time of interpretation on 06/28/2019 at 7:41 pm to provider [facility] , who verbally acknowledged these results. Electronically Signed   By: Randa Ngo M.D.   On: 06/28/2019 19:40     Assessment/Plan: 74 year old female presented to the ED tonight after  sustaining a fall yesterday afternoon. CT head shows a large acute SDH measuring 62mm at maximum thickness with 90mm of shift. I do think that her brain is able to compensate for this size SDH because of her brain atrophy. The patient actually looks quite good upon examination. Therefore, I do not think that she needs any acute neurosurgical intervention at this time. She is on BC powder, aspirin, and mobic so we will stop all of these and watch this with a repeat head CT in the morning. Would appreciate medicine admission to step down. Place on 6mg  Decadron q6hours to help with swelling.   Lindsey Rowe 06/28/2019 9:00 PM

## 2019-06-28 NOTE — ED Notes (Signed)
This RN attempted to call report for this pt; 3W RN is unavailable to take report; this RN will re-attempt in 10-15 minutes.

## 2019-06-28 NOTE — ED Provider Notes (Signed)
Lindsey Rowe [facility] EMERGENCY DEPARTMENT Provider Note   CSN: [id number] Arrival date & time: 06/28/19  1655     History Chief Complaint  Patient presents with  . Fall    Lindsey Rowe is a 74 y.o. female.  Pt presents to the ED today with a fall.  Pt said she fell around 1430 yesterday.  She was unable to get up.  Pt lives in an apartment and managed to crawl to her neighbor's place this afternoon.  The pt said she was too weak to get up.  She hurts in her back and in her right shoulder.  She did hit her head, but denies loc. No blood thinners.        Past Medical History:  Diagnosis Date  . Arthritis   . Back pain   . Bipolar 1 disorder (HCC)   . Complication of anesthesia    hard to wake up anesthesia  . Depression   . GERD (gastroesophageal reflux disease)   . Headache(784.0)    migraines and tension headaches  . Hyperlipidemia   . Hypertension   . Hypothyroidism   . Thyroid disease     Patient Active Problem List   Diagnosis Date Noted  . Subdural hematoma (HCC) 06/28/2019  . COPD exacerbation (HCC) 10/08/2018  . Asthmatic bronchitis 12/02/2017  . Acute respiratory failure with hypoxia (HCC) 06/27/2017  . Diastolic dysfunction 06/27/2017  . Acute lower UTI 06/27/2017  . Acute metabolic encephalopathy 06/27/2017  . Altered mental state 06/10/2017  . Dyspnea and respiratory abnormalities 08/29/2016  . Hypoxemia 08/29/2016  . Shoulder dislocation, right, initial encounter 05/24/2016  . 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 Hyperglycemia 05/24/2016  . Anterior dislocation of right shoulder   . Syncope 03/24/2016  . Generalized weakness 09/24/2015  . Leg pain, bilateral 09/24/2015  . Depression 09/24/2015  . Loss of appetite 09/24/2015  . Leg weakness, bilateral   . Hypothyroid 12/28/2012  . Hypovolemia 12/28/2012  . Hypotension 08/13/2012  . Dehydration 08/13/2012  . AKI (acute kidney injury) (HCC) 08/13/2012  . Hypoventilation  associated with obesity syndrome (HCC) 08/13/2012  . Acute respiratory failure (HCC) 07/26/2012  . Chest pain 07/26/2012  . ANKLE SPRAIN, RIGHT 03/16/2010  . DIVERTICULOSIS, COLON 10/31/2009  . SKIN LESION 09/23/2009  . Shoulder pain, right 07/05/2009  . COUGH 03/29/2009  . Migraine 03/11/2009  . OTITIS MEDIA, RIGHT 12/13/2008  . BRUISE 12/06/2008  . ACID REFLUX DISEASE 09/13/2008  . ALLERGIC RHINITIS 05/20/2008  . UNSPECIFIED DISEASE OF HAIR AND HAIR FOLLICLES 05/20/2008  . DERMATITIS 11/12/2007  . OBESITY 07/18/2007  . HYPERCHOLESTEROLEMIA 05/13/2007  . BIPOLAR AFFECTIVE DISORDER 05/12/2007  . HYPERTENSION, BENIGN ESSENTIAL 05/12/2007  . Low back pain potentially associated with radiculopathy 05/12/2007    Past Surgical History:  Procedure Laterality Date  . APPENDECTOMY    . BACK SURGERY    . EYE SURGERY Bilateral    lens implant  . GASTRIC BYPASS    . LOOP RECORDER INSERTION N/A 05/25/2016   Procedure: Loop Recorder Insertion;  Surgeon: Will Jorja Loa, MD;  Location: [facility] INVASIVE CV LAB;  Service: Cardiovascular;  Laterality: N/A;  . LUMBAR LAMINECTOMY/DECOMPRESSION MICRODISCECTOMY Right 07/23/2012   Procedure: LUMBAR LAMINECTOMY/DECOMPRESSION MICRODISCECTOMY 2 LEVELS;  Surgeon: Karn Cassis, MD;  Location: [facility] NEURO ORS;  Service: Neurosurgery;  Laterality: Right;  Right Lumbar three-four  lumbar four-five Laminectomy/Foraminotomy  . NASAL SINUS SURGERY Bilateral   . TONSILLECTOMY    . WRIST SURGERY  OB History   No obstetric history on file.     Family History  Problem Relation Age of Onset  . Heart disease Mother   . Emphysema Father   . Diabetes Sister   . Other Brother        Brain tumor - s/p excision    Social History   Tobacco Use  . Smoking status: Never Smoker  . Smokeless tobacco: Never Used  Substance Use Topics  . Alcohol use: Yes    Comment: social  . Drug use: No    Comment: Pt + opioids as prescribed    Home  Medications Prior to Admission medications   Medication Sig Start Date End Date Taking? Authorizing Provider  albuterol (ACCUNEB) 1.25 MG/3ML nebulizer solution Take 3 mLs (1.25 mg total) by nebulization every 6 (six) hours as needed for up to 5 days for wheezing. 10/09/18 02/13/19  Margie Ege A, DO  albuterol (PROVENTIL HFA;VENTOLIN HFA) 108 (90 Base) MCG/ACT inhaler Inhale 2 puffs into the lungs every 6 (six) hours as needed for wheezing or shortness of breath. Patient not taking: Reported on 02/13/2019 04/07/18   Charlynne Pander, MD  Carboxymethylcellulose Sodium (EYE DROPS OP) Apply 1 drop to eye daily as needed (dry eyes).    [provider]  doxycycline (VIBRAMYCIN) 100 MG capsule Take 1 capsule (100 mg total) by mouth 2 (two) times daily. Patient not taking: Reported on 02/13/2019 11/17/18   Glynn Octave, MD  DULoxetine (CYMBALTA) 30 MG capsule Take 1 capsule (30 mg total) by mouth 2 (two) times daily. Patient taking differently: Take 30-60 mg by mouth See admin instructions. 30 MG in the am and 60 MG at night 05/26/16   Pearson Grippe, MD  fluticasone [facility]) 50 MCG/ACT nasal spray Place 1 spray into both nostrils daily as needed for allergies or rhinitis. 06/29/17   Rai, Delene Ruffini, MD  HYDROcodone-acetaminophen (NORCO/VICODIN) 5-325 MG tablet Take 1 tablet by mouth every 6 (six) hours as needed for moderate pain. 02/13/19   Bethann Berkshire, MD  levothyroxine (SYNTHROID, LEVOTHROID) 75 MCG tablet Take 75 mcg by mouth daily before breakfast.  09/06/15   [provider]  lisinopril (PRINIVIL,ZESTRIL) 10 MG tablet Take 10 mg by mouth daily.  08/15/16   [provider]  loperamide (IMODIUM) 2 MG capsule Take 1 capsule (2 mg total) by mouth 3 (three) times daily as needed for diarrhea or loose stools. 07/02/17   Rai, Ripudeep K, MD  LORazepam (ATIVAN) 1 MG tablet Take 1 mg by mouth 2 (two) times daily as needed for anxiety. 12/25/18   [provider]  meloxicam (MOBIC) 15  MG tablet Take 15 mg by mouth daily as needed for pain.    [provider]  methocarbamol (ROBAXIN) 750 MG tablet Take 750 mg by mouth 3 (three) times daily. 12/19/18   [provider]  metoprolol succinate (TOPROL-XL) 50 MG 24 hr tablet Take 1 tablet (50 mg total) by mouth daily. Take with or immediately following a meal. 05/26/18   Camnitz, Andree Coss, MD  omeprazole (PRILOSEC) 20 MG capsule Take 20 mg by mouth 2 (two) times daily before a meal.    [provider]  pravastatin (PRAVACHOL) 40 MG tablet Take 40 mg by mouth daily.    [provider]  pregabalin (LYRICA) 100 MG capsule Take 1 capsule (100 mg total) by mouth 2 (two) times daily. 09/27/15   Marlin Canary U, DO  RESTASIS 0.05 % ophthalmic emulsion Place 1 drop into  both eyes 2 (two) times daily.  02/19/18   [provider]  vitamin B-12 (CYANOCOBALAMIN) 1000 MCG tablet Take 1,000 mcg by mouth daily.    [provider]  Vitamin D, Ergocalciferol, (DRISDOL) 1.25 MG (50000 UT) CAPS capsule Take 50,000 Units by mouth once a week. 03/03/18   [provider]    Allergies    Gabapentin and Cefadroxil  Review of Systems   Review of Systems  Musculoskeletal: Positive for back pain.       Right shoulder  Neurological: Positive for headaches.  All other systems reviewed and are negative.   Physical Exam Updated Vital Signs BP 135/75   Pulse 74   Resp 17   Ht 5\' 3"  (1.6 m)   Wt 106.6 kg   SpO2 100%   BMI 41.63 kg/m   Physical Exam Vitals and nursing note reviewed.  Constitutional:      Appearance: Normal appearance.  HENT:     Head: Normocephalic and atraumatic.     Right Ear: External ear normal.     Left Ear: External ear normal.     Nose: Nose normal.     Mouth/Throat:     Mouth: Mucous membranes are moist.     Pharynx: Oropharynx is clear.  Eyes:     Extraocular Movements: Extraocular movements intact.     Conjunctiva/sclera: Conjunctivae normal.      Pupils: Pupils are equal, round, and reactive to light.  Cardiovascular:     Rate and Rhythm: Normal rate and regular rhythm.     Pulses: Normal pulses.     Heart sounds: Normal heart sounds.  Pulmonary:     Effort: Pulmonary effort is normal.     Breath sounds: Normal breath sounds.  Abdominal:     General: Abdomen is flat. Bowel sounds are normal.     Palpations: Abdomen is soft.  Musculoskeletal:       Arms:     Cervical back: Normal range of motion and neck supple.  Skin:    General: Skin is warm.     Capillary Refill: Capillary refill takes less than 2 seconds.  Neurological:     General: No focal deficit present.     Mental Status: She is alert and oriented to person, place, and time.  Psychiatric:        Mood and Affect: Mood normal.        Behavior: Behavior normal.        Thought Content: Thought content normal.        Judgment: Judgment normal.     ED Results / Procedures / Treatments   Labs (all labs ordered are listed, but only abnormal results are displayed) Labs Reviewed  COMPREHENSIVE METABOLIC PANEL - Abnormal; Notable for the following components:      Result Value   Calcium 8.4 (*)    Total Protein 6.0 (*)    All other components within normal limits  CBC WITH DIFFERENTIAL/PLATELET - Abnormal; Notable for the following components:   Hemoglobin 11.7 (*)    All other components within normal limits  CK - Abnormal; Notable for the following components:   Total CK 340 (*)    All other components within normal limits  TROPONIN I (HIGH SENSITIVITY) - Abnormal; Notable for the following components:   Troponin I (High Sensitivity) 22 (*)    All other components within normal limits  TROPONIN I (HIGH SENSITIVITY) - Abnormal; Notable for the following components:   Troponin I (High Sensitivity) 22 (*)  All other components within normal limits  SARS CORONAVIRUS 2 BY RT PCR [facility] ORDER, PERFORMED IN [facility] LAB)  URINALYSIS, ROUTINE W  REFLEX MICROSCOPIC    EKG EKG Interpretation  Date/Time:  Sunday Jun 28 2019 17:01:08 EDT Ventricular Rate:  60 PR Interval:    QRS Duration: 96 QT Interval:  428 QTC Calculation: 428 R Axis:   59 Text Interpretation: Sinus rhythm Low voltage, precordial leads No significant change since last tracing Confirmed by Jacalyn Lefevre [phone]) on 06/28/2019 6:16:53 PM   Radiology DG Chest 2 View  Result Date: 06/28/2019 CLINICAL DATA:  Fall.  Pain. EXAM: CHEST - 2 VIEW COMPARISON:  None. FINDINGS: Healed right rib fractures are identified. There is a rounded density adjacent to the most inferior healed right rib fracture which is less prominent compared October 2020. There is a rounded density in the left lung new since October 2020. No pneumothorax. The cardiomediastinal silhouette is normal. No other acute abnormalities. IMPRESSION: 1. The rounded ill-defined density in the left mid lung new since October 2020 may represent infiltrate or nodule/neoplasm. If the patient does not have signs of infection, recommend CT imaging. If the patient has signs of infection, recommend treatment with short-term follow-up imaging in 2 or 3 weeks. 2. Healed right rib fractures, unchanged since October 2020. No pneumothorax. 3. Mildly rounded density in the right mid lung is improved since October 2020 and was likely sequela of the previous adjacent rib fracture. Electronically Signed   By: Gerome Sam III M.D   On: 06/28/2019 18:02   DG Lumbar Spine Complete  Result Date: 06/28/2019 CLINICAL DATA:  Pain after fall EXAM: LUMBAR SPINE - COMPLETE 4+ VIEW COMPARISON:  October 08, 2013 FINDINGS: Scoliotic curvature lumbar spine persist. Multilevel degenerative disc disease and lower lumbar facet degenerative changes. Scalloping of the superior endplate of L4 is similar since 2015. Scalloping of the inferior endplate of L3 is similar. The configuration of L2 is similar as well. No traumatic malalignment. Mild  calcified atherosclerosis in the distal abdominal aorta. No other abnormalities. IMPRESSION: No acute fracture or traumatic malalignment. Calcified atherosclerosis in the distal abdominal aorta. Degenerative changes as above. Electronically Signed   By: Gerome Sam III M.D   On: 06/28/2019 18:06   DG Pelvis 1-2 Views  Result Date: 06/28/2019 CLINICAL DATA:  Pain after fall EXAM: PELVIS - 1-2 VIEW COMPARISON:  None. FINDINGS: There is no evidence of pelvic fracture or diastasis. No pelvic bone lesions are seen. IMPRESSION: Negative. Electronically Signed   By: Gerome Sam III M.D   On: 06/28/2019 18:07   DG Shoulder Right  Result Date: 06/28/2019 CLINICAL DATA:  Pain after fall EXAM: RIGHT SHOULDER - 2+ VIEW COMPARISON:  None. FINDINGS: Deformity of the right humeral head is unchanged since comparison studies and nonacute. This is likely sequela of previous trauma. No acute fracture or dislocation. Healed right rib fractures. High riding humeral head suggesting rotator cuff tear, likely nonacute. No other acute abnormalities. IMPRESSION: No acute abnormalities identified. High riding humeral head suggesting rotator cuff tear, nonacute. Deformity of the right humeral head, nonacute, likely from previous trauma. Electronically Signed   By: Gerome Sam III M.D   On: 06/28/2019 18:04   CT Head Wo Contrast  Result Date: 06/28/2019 CLINICAL DATA:  [place], headache EXAM: CT HEAD WITHOUT CONTRAST CT CERVICAL SPINE WITHOUT CONTRAST TECHNIQUE: Multidetector CT imaging of the head and cervical spine was performed following the standard protocol without intravenous contrast. Multiplanar CT image reconstructions  of the cervical spine were also generated. COMPARISON:  10/07/2018 FINDINGS: CT HEAD FINDINGS Brain: There is a large right-sided subdural hematoma extending along the entire right cerebral hemisphere. Subdural hematoma measures up to 14 mm in greatest thickness. There is mass effect upon the right  cerebral hemisphere with sulcal effacement and leftward midline shift measuring 7 mm at the level of the septum pellucidum. Small subdural hematomas are seen along the posterior falx as well, measuring up to 3 mm in thickness. I do not see any acute infarct. Lateral ventricles are otherwise unremarkable. Vascular: No hyperdense vessel or unexpected calcification. Skull: Normal. Negative for fracture or focal lesion. Sinuses/Orbits: No acute finding. Other: None CT CERVICAL SPINE FINDINGS Alignment: Alignment is grossly anatomic. Skull base and vertebrae: No acute displaced cervical spine fractures. Soft tissues and spinal canal: No prevertebral fluid or swelling. No visible canal hematoma. Disc levels: There is multilevel cervical spondylosis, with disc space narrowing and osteophyte formation most pronounced at C3-4, C5-6, and C6-7. Mild facet hypertrophy at C4-5. There is mild symmetrical neural foraminal encroachment at C3-4. Upper chest: Airway is patent.  Lung apices are clear. Other: Reconstructed images demonstrate no additional findings. IMPRESSION: CT head: 1. Large right-sided subdural hematoma measuring up to 14 mm in thickness. Mass effect on the right cerebral hemisphere, with leftward midline shift measuring 7 mm. 2. Small subdural hematomas along the posterior falx measuring up to 3 mm in thickness. CT cervical spine: 1. No acute cervical spine fracture. 2. Multilevel spondylosis greatest at C3-4. These results were called by telephone at the time of interpretation on 06/28/2019 at 7:41 pm to provider [facility] , who verbally acknowledged these results. Electronically Signed   By: Randa Ngo M.D.   On: 06/28/2019 19:40   CT Chest W Contrast  Result Date: 06/28/2019 CLINICAL DATA:  [place] yesterday, intracranial hemorrhage EXAM: CT CHEST WITH CONTRAST TECHNIQUE: Multidetector CT imaging of the chest was performed during intravenous contrast administration. CONTRAST:  83mL OMNIPAQUE IOHEXOL  300 MG/ML  SOLN COMPARISON:  11/17/2018 FINDINGS: Cardiovascular: The heart and great vessels are unremarkable without pericardial effusion. Atherosclerosis of the coronary vasculature greatest in the LAD distribution. There is stable aneurysmal dilatation of the ascending thoracic aorta, measuring up to 4.4 cm. No evidence of dissection. Mediastinum/Nodes: No enlarged mediastinal, hilar, or axillary lymph nodes. Thyroid gland, trachea, and esophagus demonstrate no significant findings. Lungs/Pleura: No airspace disease, effusion, or pneumothorax. Central airways are patent. Upper Abdomen: Stable postsurgical changes from prior gastric surgery. No acute upper abdominal findings. Musculoskeletal: There are prior healed bilateral rib fractures. No acute displaced fractures. Reconstructed images demonstrate no additional findings. IMPRESSION: 1. No acute intrathoracic process. 2. 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 Recommend annual imaging followup by CTA or MRA. This recommendation follows 2010 ACCF/AHA/AATS/ACR/ASA/SCA/SCAI/SIR/STS/SVM Guidelines for the Diagnosis and Management of Patients with Thoracic Aortic Disease. Circulation. 2010; 121: F818-E993. Aortic aneurysm NOS (ICD10-I71.9) 3. Atherosclerosis of the coronary vasculature greatest in the LAD distribution. Electronically Signed   By: Randa Ngo M.D.   On: 06/28/2019 19:40   CT Cervical Spine Wo Contrast  Result Date: 06/28/2019 CLINICAL DATA:  [place], headache EXAM: CT HEAD WITHOUT CONTRAST CT CERVICAL SPINE WITHOUT CONTRAST TECHNIQUE: Multidetector CT imaging of the head and cervical spine was performed following the standard protocol without intravenous contrast. Multiplanar CT image reconstructions of the cervical spine were also generated. COMPARISON:  10/07/2018 FINDINGS: CT HEAD FINDINGS Brain: There is a large right-sided subdural hematoma  extending along the entire right cerebral hemisphere.  Subdural hematoma measures up to 14 mm in greatest thickness. There is mass effect upon the right cerebral hemisphere with sulcal effacement and leftward midline shift measuring 7 mm at the level of the septum pellucidum. Small subdural hematomas are seen along the posterior falx as well, measuring up to 3 mm in thickness. I do not see any acute infarct. Lateral ventricles are otherwise unremarkable. Vascular: No hyperdense vessel or unexpected calcification. Skull: Normal. Negative for fracture or focal lesion. Sinuses/Orbits: No acute finding. Other: None CT CERVICAL SPINE FINDINGS Alignment: Alignment is grossly anatomic. Skull base and vertebrae: No acute displaced cervical spine fractures. Soft tissues and spinal canal: No prevertebral fluid or swelling. No visible canal hematoma. Disc levels: There is multilevel cervical spondylosis, with disc space narrowing and osteophyte formation most pronounced at C3-4, C5-6, and C6-7. Mild facet hypertrophy at C4-5. There is mild symmetrical neural foraminal encroachment at C3-4. Upper chest: Airway is patent.  Lung apices are clear. Other: Reconstructed images demonstrate no additional findings. IMPRESSION: CT head: 1. Large right-sided subdural hematoma measuring up to 14 mm in thickness. Mass effect on the right cerebral hemisphere, with leftward midline shift measuring 7 mm. 2. Small subdural hematomas along the posterior falx measuring up to 3 mm in thickness. CT cervical spine: 1. No acute cervical spine fracture. 2. Multilevel spondylosis greatest at C3-4. These results were called by telephone at the time of interpretation on 06/28/2019 at 7:41 pm to provider [facility] , who verbally acknowledged these results. Electronically Signed   By: Sharlet Salina M.D.   On: 06/28/2019 19:40    Procedures Procedures (including critical care time)  Medications Ordered in ED Medications  dexamethasone (DECADRON) injection 8 mg (has no administration in time range)   pantoprazole (PROTONIX) injection 40 mg (40 mg Intravenous Given 06/28/19 2120)  levETIRAcetam (KEPPRA) IVPB 500 mg/100 mL premix (500 mg Intravenous New Bag/Given 06/28/19 2125)  sodium chloride 0.9 % bolus 1,000 mL (0 mLs Intravenous Stopped 06/28/19 1949)  morphine 4 MG/ML injection 4 mg (4 mg Intravenous Given 06/28/19 1817)  ondansetron (ZOFRAN) injection 4 mg (4 mg Intravenous Given 06/28/19 1817)  iohexol (OMNIPAQUE) 300 MG/ML solution 75 mL (75 mLs Intravenous Contrast Given 06/28/19 1922)    ED Course  I have reviewed the triage vital signs and the nursing notes.  Pertinent labs & imaging results that were available during my care of the patient were reviewed by me and considered in my medical decision making (see chart for details).    MDM Rules/Calculators/A&P                      Result of CT scan d/w NS.  Dr. Wynetta Emery saw pt in the ED and feels like he does not need to operate tonight as she is neurologically intact.  He requests hospitalist admission.  Keep pt NPO in case she worsens.  Hold mobic, asa, bc powders.  Repeat CT in the morning.  NS ordered decadron for brain swelling as well as keppra to prevent a seizure.  Pt d/w Dr. Toniann Fail for admission.  Final Clinical Impression(s) / ED Diagnoses Final diagnoses:  Fall, initial encounter  Subdural hematoma [facility])  Ambulatory dysfunction    Rx / DC Orders ED Discharge Orders    None       Jacalyn Lefevre, MD 06/28/19 2143

## 2019-06-28 NOTE — ED Triage Notes (Signed)
Pt fell yesterday afternoon, continously fell while trying to stand, managed to crawl to end of walkway in apartment when a neighbor found her. Aox4, c/o mainly of back pain, new bruising on R shoulder. VSS for [facility].

## 2019-06-28 NOTE — ED Notes (Signed)
Provider at Bedside

## 2019-06-29 ENCOUNTER — Inpatient Hospital Stay (HOSPITAL_COMMUNITY): Payer: Medicare HMO

## 2019-06-29 LAB — GLUCOSE, CAPILLARY
Glucose-Capillary: 132 mg/dL — ABNORMAL HIGH (ref 70–99)
Glucose-Capillary: 144 mg/dL — ABNORMAL HIGH (ref 70–99)
Glucose-Capillary: 196 mg/dL — ABNORMAL HIGH (ref 70–99)

## 2019-06-29 LAB — CK: Total CK: 323 U/L — ABNORMAL HIGH (ref 38–234)

## 2019-06-29 LAB — SURGICAL PCR SCREEN
MRSA, PCR: NEGATIVE
Staphylococcus aureus: NEGATIVE

## 2019-06-29 LAB — LACTIC ACID, PLASMA: Lactic Acid, Venous: 1.5 mmol/L (ref 0.5–1.9)

## 2019-06-29 MED ORDER — TIZANIDINE HCL 4 MG PO TABS
2.0000 mg | ORAL_TABLET | Freq: Three times a day (TID) | ORAL | Status: DC | PRN
  Administered 2019-06-29 – 2019-07-01 (×2): 2 mg via ORAL
  Filled 2019-06-29 (×2): qty 1

## 2019-06-29 MED ORDER — LORAZEPAM 0.5 MG PO TABS
0.5000 mg | ORAL_TABLET | Freq: Two times a day (BID) | ORAL | Status: DC | PRN
  Administered 2019-06-29 – 2019-06-30 (×2): 0.5 mg via ORAL
  Filled 2019-06-29 (×3): qty 1

## 2019-06-29 MED ORDER — PANTOPRAZOLE SODIUM 40 MG PO TBEC
40.0000 mg | DELAYED_RELEASE_TABLET | Freq: Every day | ORAL | Status: DC
  Administered 2019-06-30 – 2019-07-02 (×3): 40 mg via ORAL
  Filled 2019-06-29 (×3): qty 1

## 2019-06-29 MED ORDER — ACETAMINOPHEN 325 MG PO TABS
650.0000 mg | ORAL_TABLET | Freq: Four times a day (QID) | ORAL | Status: DC | PRN
  Administered 2019-06-29 – 2019-07-01 (×4): 650 mg via ORAL
  Filled 2019-06-29 (×4): qty 2

## 2019-06-29 MED ORDER — DEXAMETHASONE 4 MG PO TABS
6.0000 mg | ORAL_TABLET | Freq: Three times a day (TID) | ORAL | Status: DC
  Administered 2019-06-29 – 2019-06-30 (×2): 6 mg via ORAL
  Filled 2019-06-29 (×3): qty 1

## 2019-06-29 MED ORDER — MORPHINE SULFATE (PF) 2 MG/ML IV SOLN
1.0000 mg | INTRAVENOUS | Status: DC | PRN
  Administered 2019-06-30: 1 mg via INTRAVENOUS
  Filled 2019-06-29 (×3): qty 1

## 2019-06-29 MED ORDER — FLUTICASONE PROPIONATE 50 MCG/ACT NA SUSP
1.0000 | Freq: Every day | NASAL | Status: DC | PRN
  Filled 2019-06-29: qty 16

## 2019-06-29 MED ORDER — HALOPERIDOL LACTATE 5 MG/ML IJ SOLN
5.0000 mg | Freq: Four times a day (QID) | INTRAMUSCULAR | Status: AC | PRN
  Administered 2019-06-29: 5 mg via INTRAVENOUS
  Filled 2019-06-29: qty 1

## 2019-06-29 MED ORDER — LEVETIRACETAM 500 MG PO TABS
500.0000 mg | ORAL_TABLET | Freq: Two times a day (BID) | ORAL | Status: DC
  Administered 2019-06-29 – 2019-07-02 (×6): 500 mg via ORAL
  Filled 2019-06-29 (×6): qty 1

## 2019-06-29 MED ORDER — LEVOTHYROXINE SODIUM 75 MCG PO TABS
75.0000 ug | ORAL_TABLET | Freq: Every day | ORAL | Status: DC
  Administered 2019-06-30 – 2019-07-02 (×3): 75 ug via ORAL
  Filled 2019-06-29 (×3): qty 1

## 2019-06-29 MED ORDER — ALBUTEROL SULFATE (2.5 MG/3ML) 0.083% IN NEBU: 3.0000 mL | INHALATION_SOLUTION | Freq: Four times a day (QID) | RESPIRATORY_TRACT | Status: DC | PRN

## 2019-06-29 NOTE — Progress Notes (Signed)
Came from the ED at 2317. Alert and oriented. Had sandwich and drink before 12am. Call light within reach.

## 2019-06-29 NOTE — Consult Note (Signed)
   [facility] CM Inpatient Consult   06/29/2019  Lindsey Rowe 15-May-1945 [id number]   Patient is currently active with [facility] [[facility]] Care Management for resource services.  Patient has been engaged by a [facility] psychotherapist as well has been outreached with a [facility].   Our community based plan of care has focused on disease management and community resource support.    Patient will receive a post hospital call and will be evaluated for assessments and disease process education if returning home/community.    Plan: Will follow for progress and with Inpatient Transition Of Care [TOC] team member to make aware that [facility] Care Management following. Follow for patient's progress and disposition needs.   Of note, [facility] Care Management services does not replace or interfere with any services that are needed or arranged by inpatient [facility] care management team.  For additional questions or referrals please contact:   Charlesetta Shanks, RN BSN CCM [facility]  [phone] business mobile phone Toll free office [phone]  Fax number: [phone] [email or web address]

## 2019-06-29 NOTE — Evaluation (Signed)
Clinical/Bedside Swallow Evaluation Patient Details  Name: Lindsey Rowe MRN: [id number] Date of Birth: 09/29/1945  Today's Date: 06/29/2019 Time: SLP Start Time (ACUTE ONLY): 1020 SLP Stop Time (ACUTE ONLY): 1030 SLP Time Calculation (min) (ACUTE ONLY): 10 min  Past Medical History:  Past Medical History:  Diagnosis Date  . Arthritis   . Back pain   . Bipolar 1 disorder (HCC)   . Complication of anesthesia    hard to wake up anesthesia  . Depression   . GERD (gastroesophageal reflux disease)   . Headache(784.0)    migraines and tension headaches  . Hyperlipidemia   . Hypertension   . Hypothyroidism   . Thyroid disease    Past Surgical History:  Past Surgical History:  Procedure Laterality Date  . APPENDECTOMY    . BACK SURGERY    . EYE SURGERY Bilateral    lens implant  . GASTRIC BYPASS    . LOOP RECORDER INSERTION N/A 05/25/2016   Procedure: Loop Recorder Insertion;  Surgeon: Will Jorja Loa, MD;  Location: [facility];  Service: Cardiovascular;  Laterality: N/A;  . LUMBAR LAMINECTOMY/DECOMPRESSION MICRODISCECTOMY Right 07/23/2012   Procedure: LUMBAR LAMINECTOMY/DECOMPRESSION MICRODISCECTOMY 2 LEVELS;  Surgeon: Karn Cassis, MD;  Location: [facility];  Service: Neurosurgery;  Laterality: Right;  Right Lumbar three-four  lumbar four-five Laminectomy/Foraminotomy  . NASAL SINUS SURGERY Bilateral   . TONSILLECTOMY    . WRIST SURGERY     HPI:  74 year old female presented to the ED tonight after sustaining a fall yesterday afternoon. CT head shows a large acute SDH measuring 14mm at maximum thickness with 51mm of shift.    Assessment / Plan / Recommendation Clinical Impression  Pt demonstrates adequate tolerance of regular solids and thin liquids. No SLP f/u needed will sign off.       Aspiration Risk       Diet Recommendation Regular;Thin liquid        Other  Recommendations     Follow up Recommendations None      Frequency and Duration             Prognosis        Swallow Study   General HPI: 74 year old female presented to the ED tonight after sustaining a fall yesterday afternoon. CT head shows a large acute SDH measuring 40mm at maximum thickness with 58mm of shift.  Type of Study: Bedside Swallow Evaluation Previous Swallow Assessment: none Diet Prior to this Study: Dysphagia 1 (puree);Thin liquids Temperature Spikes Noted: No Respiratory Status: Room air History of Recent Intubation: No Behavior/Cognition: Alert;Cooperative;Pleasant mood Oral Cavity Assessment: Within Functional Limits Oral Care Completed by SLP: No Oral Cavity - Dentition: Adequate natural dentition Vision: Functional for self-feeding Self-Feeding Abilities: Able to feed self Patient Positioning: Upright in bed Baseline Vocal Quality: Normal Volitional Cough: Strong Volitional Swallow: Able to elicit    Oral/Motor/Sensory Function     Ice Chips     Thin Liquid Thin Liquid: Within functional limits Presentation: Straw;Self Fed    Nectar Thick Nectar Thick Liquid: Not tested   Honey Thick Honey Thick Liquid: Not tested   Puree Puree: Within functional limits   Solid     Solid: Within functional limits      Lindsey Rowe, Riley Nearing 06/29/2019,2:09 PM

## 2019-06-29 NOTE — Evaluation (Signed)
Physical Therapy Evaluation Patient Details Name: Lindsey Rowe MRN: [id number] DOB: [birth year] Today's Date: 06/29/2019   History of Present Illness  74 yo female with onset of falls has sustained a R frontal SDH with 14 mm thickness, with 7 mm shift from midline.  Has no fractures on imaging of R shoulder and pelvis.  PMHx:  bipolar disorder, migraines, back pain with lumbar microdiscectomy, cataract resection, cervical spondylosis with osteophytes on C3-4, C5-6 and C 6-7, rib fractures, abd aortic atherosclerosis,  Clinical Impression  Pt was assessed with work on bed mob and transfers, demonstrating general weakness and difficulty with motor planning of tasks.  Additionally is unsafe to stand or step without a walker and significant assistance.  Her control of all balance skills requires both tactile and verbal cues, mainly sequencing of transfers and steps.  Follow acutely for safety and strengthening.  Increase standing tolerance toward more independence with gait.    Follow Up Recommendations SNF    Equipment Recommendations  None recommended by PT    Recommendations for Other Services       Precautions / Restrictions Precautions Precautions: Fall Precaution Comments: impulsive Restrictions Weight Bearing Restrictions: No      Mobility  Bed Mobility Overal bed mobility: Needs Assistance Bed Mobility: Supine to Sit;Sit to Supine     Supine to sit: Mod assist Sit to supine: Mod assist   General bed mobility comments: mod assist to  help scoot hips and control sitting balance  Transfers Overall transfer level: Needs assistance Equipment used: Rolling walker (2 wheeled);1 person hand held assist Transfers: Sit to/from [facility] Sit to Stand: Mod assist Stand pivot transfers: Mod assist       General transfer comment: mod with RW to pivot to [facility]  Ambulation/Gait             General Gait Details: few steps related to transfers and not a  bout of gait  Stairs            Wheelchair Mobility    Modified Rankin (Stroke Patients Only)       Balance Overall balance assessment: History of Falls;Needs assistance Sitting-balance support: Feet supported;Bilateral upper extremity supported Sitting balance-Leahy Scale: Fair     Standing balance support: Bilateral upper extremity supported;During functional activity Standing balance-Leahy Scale: Poor                               Pertinent Vitals/Pain Pain Assessment: 0-10 Pain Score: 10-Worst pain ever Pain Location: HA    Home Living Family/patient expects to be discharged to:: Private residence Living Arrangements: Alone Available Help at Discharge: Family;Friend(s);Available PRN/intermittently Type of Home: Apartment Home Access: Level entry     Home Layout: One level Home Equipment: Walker - 4 wheels;Grab bars - toilet;Shower seat      Prior Function Level of Independence: Independent with assistive device(s)         Comments: uses rollator, has been able to do her own care but has been less mobille with recent falls     Hand Dominance        Extremity/Trunk Assessment   Upper Extremity Assessment Upper Extremity Assessment: Generalized weakness    Lower Extremity Assessment Lower Extremity Assessment: Generalized weakness    Cervical / Trunk Assessment Cervical / Trunk Assessment: Kyphotic  Communication   Communication: No difficulties  Cognition Arousal/Alertness: Awake/alert Behavior During Therapy: Impulsive Overall Cognitive Status: Impaired/Different from baseline Area of  Impairment: Problem solving;Awareness;Safety/judgement;Following commands;Attention                   Current Attention Level: Selective   Following Commands: Follows one step commands inconsistently;Follows one step commands with increased time Safety/Judgement: Decreased awareness of safety;Decreased awareness of deficits Awareness:  Intellectual Problem Solving: Slow processing;Requires verbal cues;Decreased initiation General Comments: pt requires assistance to maintain sitting balance control      General Comments General comments (skin integrity, edema, etc.): Has control of sittting with contact and cues, but in standing is dense repetitive physical and tactile assist    Exercises     Assessment/Plan    PT Assessment Patient needs continued PT services  PT Problem List Decreased strength;Decreased range of motion;Decreased activity tolerance;Decreased balance;Decreased mobility;Decreased coordination;Decreased cognition;Decreased knowledge of use of DME;Decreased safety awareness;Decreased knowledge of precautions;Cardiopulmonary status limiting activity;Decreased skin integrity;Obesity       PT Treatment Interventions DME instruction;Gait training;Functional mobility training;Therapeutic activities;Therapeutic exercise;Balance training;Neuromuscular re-education;Patient/family education    PT Goals (Current goals can be found in the Care Plan section)  Acute Rehab PT Goals Patient Stated Goal: to get better and get home    Frequency Min 3X/week   Barriers to discharge Decreased caregiver support home alone with two person help needed    Co-evaluation               AM-PAC PT "6 Clicks" Mobility  Outcome Measure Help needed turning from your back to your side while in a flat bed without using bedrails?: A Little Help needed moving from lying on your back to sitting on the side of a flat bed without using bedrails?: A Lot Help needed moving to and from a bed to a chair (including a wheelchair)?: A Lot Help needed standing up from a chair using your arms (e.g., wheelchair or bedside chair)?: A Lot Help needed to walk in hospital room?: Total Help needed climbing 3-5 steps with a railing? : Total 6 Click Score: 11    End of Session Equipment Utilized During Treatment: Gait belt;Oxygen Activity  Tolerance: Patient limited by fatigue;Treatment limited secondary to medical complications (Comment) Patient left: in bed;with call bell/phone within reach;with bed alarm set Nurse Communication: Mobility status PT Visit Diagnosis: Unsteadiness on feet (R26.81);Muscle weakness (generalized) (M62.81);Difficulty in walking, not elsewhere classified (R26.2)    Time: 9211-9417 PT Time Calculation (min) (ACUTE ONLY): 43 min   Charges:   PT Evaluation $PT Eval Moderate Complexity: 1 Mod PT Treatments $Gait Training: 8-22 mins $Therapeutic Activity: 8-22 mins       Ivar Drape 06/29/2019, 9:53 PM  Samul Dada, PT MS Acute Rehab Dept. Number: [facility] [id number] and [facility] [phone]

## 2019-06-29 NOTE — Progress Notes (Signed)
Subjective: Patient reports Patient overall still complaining of headache but slightly improved  Objective: Vital signs in last 24 hours: Temp:  [97.9 F (36.6 C)-98.2 F (36.8 C)] 98.1 F (36.7 C) (05/24 0759) Pulse Rate:  [61-88] 61 (05/24 0759) Resp:  [15-24] 18 (05/24 0759) BP: (94-151)/(61-115) 122/69 (05/24 0759) SpO2:  [94 %-100 %] 99 % (05/24 0759) Weight:  [106.6 kg] 106.6 kg (05/23 1658)  Intake/Output from previous day: No intake/output data recorded. Intake/Output this shift: No intake/output data recorded.  Awake alert oriented pupils equal cranial nerves intact strength 5 out of 5 no pronator drift  Lab Results: Recent Labs    06/28/19 1813  WBC 5.4  HGB 11.7*  HCT 38.2  PLT 165   BMET Recent Labs    06/28/19 1813  NA 142  K 3.6  CL 108  CO2 26  GLUCOSE 96  BUN 15  CREATININE 0.81  CALCIUM 8.4*    Studies/Results: DG Chest 2 View  Result Date: 06/28/2019 CLINICAL DATA:  Fall.  Pain. EXAM: CHEST - 2 VIEW COMPARISON:  None. FINDINGS: Healed right rib fractures are identified. There is a rounded density adjacent to the most inferior healed right rib fracture which is less prominent compared October 2020. There is a rounded density in the left lung new since October 2020. No pneumothorax. The cardiomediastinal silhouette is normal. No other acute abnormalities. IMPRESSION: 1. The rounded ill-defined density in the left mid lung new since October 2020 may represent infiltrate or nodule/neoplasm. If the patient does not have signs of infection, recommend CT imaging. If the patient has signs of infection, recommend treatment with short-term follow-up imaging in 2 or 3 weeks. 2. Healed right rib fractures, unchanged since October 2020. No pneumothorax. 3. Mildly rounded density in the right mid lung is improved since October 2020 and was likely sequela of the previous adjacent rib fracture. Electronically Signed   By: Gerome Sam III M.D   On: 06/28/2019  18:02   DG Lumbar Spine Complete  Result Date: 06/28/2019 CLINICAL DATA:  Pain after fall EXAM: LUMBAR SPINE - COMPLETE 4+ VIEW COMPARISON:  October 08, 2013 FINDINGS: Scoliotic curvature lumbar spine persist. Multilevel degenerative disc disease and lower lumbar facet degenerative changes. Scalloping of the superior endplate of L4 is similar since 2015. Scalloping of the inferior endplate of L3 is similar. The configuration of L2 is similar as well. No traumatic malalignment. Mild calcified atherosclerosis in the distal abdominal aorta. No other abnormalities. IMPRESSION: No acute fracture or traumatic malalignment. Calcified atherosclerosis in the distal abdominal aorta. Degenerative changes as above. Electronically Signed   By: Gerome Sam III M.D   On: 06/28/2019 18:06   DG Pelvis 1-2 Views  Result Date: 06/28/2019 CLINICAL DATA:  Pain after fall EXAM: PELVIS - 1-2 VIEW COMPARISON:  None. FINDINGS: There is no evidence of pelvic fracture or diastasis. No pelvic bone lesions are seen. IMPRESSION: Negative. Electronically Signed   By: Gerome Sam III M.D   On: 06/28/2019 18:07   DG Shoulder Right  Result Date: 06/28/2019 CLINICAL DATA:  Pain after fall EXAM: RIGHT SHOULDER - 2+ VIEW COMPARISON:  None. FINDINGS: Deformity of the right humeral head is unchanged since comparison studies and nonacute. This is likely sequela of previous trauma. No acute fracture or dislocation. Healed right rib fractures. High riding humeral head suggesting rotator cuff tear, likely nonacute. No other acute abnormalities. IMPRESSION: No acute abnormalities identified. High riding humeral head suggesting rotator cuff tear, nonacute. Deformity of the right  humeral head, nonacute, likely from previous trauma. Electronically Signed   By: Dorise Bullion III M.D   On: 06/28/2019 18:04   CT Head Wo Contrast  Result Date: 06/28/2019 CLINICAL DATA:  [place], headache EXAM: CT HEAD WITHOUT CONTRAST CT CERVICAL SPINE  WITHOUT CONTRAST TECHNIQUE: Multidetector CT imaging of the head and cervical spine was performed following the standard protocol without intravenous contrast. Multiplanar CT image reconstructions of the cervical spine were also generated. COMPARISON:  10/07/2018 FINDINGS: CT HEAD FINDINGS Brain: There is a large right-sided subdural hematoma extending along the entire right cerebral hemisphere. Subdural hematoma measures up to 14 mm in greatest thickness. There is mass effect upon the right cerebral hemisphere with sulcal effacement and leftward midline shift measuring 7 mm at the level of the septum pellucidum. Small subdural hematomas are seen along the posterior falx as well, measuring up to 3 mm in thickness. I do not see any acute infarct. Lateral ventricles are otherwise unremarkable. Vascular: No hyperdense vessel or unexpected calcification. Skull: Normal. Negative for fracture or focal lesion. Sinuses/Orbits: No acute finding. Other: None CT CERVICAL SPINE FINDINGS Alignment: Alignment is grossly anatomic. Skull base and vertebrae: No acute displaced cervical spine fractures. Soft tissues and spinal canal: No prevertebral fluid or swelling. No visible canal hematoma. Disc levels: There is multilevel cervical spondylosis, with disc space narrowing and osteophyte formation most pronounced at C3-4, C5-6, and C6-7. Mild facet hypertrophy at C4-5. There is mild symmetrical neural foraminal encroachment at C3-4. Upper chest: Airway is patent.  Lung apices are clear. Other: Reconstructed images demonstrate no additional findings. IMPRESSION: CT head: 1. Large right-sided subdural hematoma measuring up to 14 mm in thickness. Mass effect on the right cerebral hemisphere, with leftward midline shift measuring 7 mm. 2. Small subdural hematomas along the posterior falx measuring up to 3 mm in thickness. CT cervical spine: 1. No acute cervical spine fracture. 2. Multilevel spondylosis greatest at C3-4. These results  were called by telephone at the time of interpretation on 06/28/2019 at 7:41 pm to provider [facility] , who verbally acknowledged these results. Electronically Signed   By: Randa Ngo M.D.   On: 06/28/2019 19:40   CT Chest W Contrast  Result Date: 06/28/2019 CLINICAL DATA:  [place] yesterday, intracranial hemorrhage EXAM: CT CHEST WITH CONTRAST TECHNIQUE: Multidetector CT imaging of the chest was performed during intravenous contrast administration. CONTRAST:  40mL OMNIPAQUE IOHEXOL 300 MG/ML  SOLN COMPARISON:  11/17/2018 FINDINGS: Cardiovascular: The heart and great vessels are unremarkable without pericardial effusion. Atherosclerosis of the coronary vasculature greatest in the LAD distribution. There is stable aneurysmal dilatation of the ascending thoracic aorta, measuring up to 4.4 cm. No evidence of dissection. Mediastinum/Nodes: No enlarged mediastinal, hilar, or axillary lymph nodes. Thyroid gland, trachea, and esophagus demonstrate no significant findings. Lungs/Pleura: No airspace disease, effusion, or pneumothorax. Central airways are patent. Upper Abdomen: Stable postsurgical changes from prior gastric surgery. No acute upper abdominal findings. Musculoskeletal: There are prior healed bilateral rib fractures. No acute displaced fractures. Reconstructed images demonstrate no additional findings. IMPRESSION: 1. No acute intrathoracic process. 2. 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 Recommend annual imaging followup by CTA or MRA. This recommendation follows 2010 ACCF/AHA/AATS/ACR/ASA/SCA/SCAI/SIR/STS/SVM Guidelines for the Diagnosis and Management of Patients with Thoracic Aortic Disease. Circulation. 2010; 121: P710-G269. Aortic aneurysm NOS (ICD10-I71.9) 3. Atherosclerosis of the coronary vasculature greatest in the LAD distribution. Electronically Signed   By: Randa Ngo M.D.   On: 06/28/2019 19:40  CT Cervical Spine Wo Contrast  Result  Date: 06/28/2019 CLINICAL DATA:  [place], headache EXAM: CT HEAD WITHOUT CONTRAST CT CERVICAL SPINE WITHOUT CONTRAST TECHNIQUE: Multidetector CT imaging of the head and cervical spine was performed following the standard protocol without intravenous contrast. Multiplanar CT image reconstructions of the cervical spine were also generated. COMPARISON:  10/07/2018 FINDINGS: CT HEAD FINDINGS Brain: There is a large right-sided subdural hematoma extending along the entire right cerebral hemisphere. Subdural hematoma measures up to 14 mm in greatest thickness. There is mass effect upon the right cerebral hemisphere with sulcal effacement and leftward midline shift measuring 7 mm at the level of the septum pellucidum. Small subdural hematomas are seen along the posterior falx as well, measuring up to 3 mm in thickness. I do not see any acute infarct. Lateral ventricles are otherwise unremarkable. Vascular: No hyperdense vessel or unexpected calcification. Skull: Normal. Negative for fracture or focal lesion. Sinuses/Orbits: No acute finding. Other: None CT CERVICAL SPINE FINDINGS Alignment: Alignment is grossly anatomic. Skull base and vertebrae: No acute displaced cervical spine fractures. Soft tissues and spinal canal: No prevertebral fluid or swelling. No visible canal hematoma. Disc levels: There is multilevel cervical spondylosis, with disc space narrowing and osteophyte formation most pronounced at C3-4, C5-6, and C6-7. Mild facet hypertrophy at C4-5. There is mild symmetrical neural foraminal encroachment at C3-4. Upper chest: Airway is patent.  Lung apices are clear. Other: Reconstructed images demonstrate no additional findings. IMPRESSION: CT head: 1. Large right-sided subdural hematoma measuring up to 14 mm in thickness. Mass effect on the right cerebral hemisphere, with leftward midline shift measuring 7 mm. 2. Small subdural hematomas along the posterior falx measuring up to 3 mm in thickness. CT cervical spine:  1. No acute cervical spine fracture. 2. Multilevel spondylosis greatest at C3-4. These results were called by telephone at the time of interpretation on 06/28/2019 at 7:41 pm to provider [facility] , who verbally acknowledged these results. Electronically Signed   By: Sharlet Salina M.D.   On: 06/28/2019 19:40    Assessment/Plan: Hospital day 1 post injury day 2 moderate sized acute subdural with small amount of midline shift patient tolerating it very well continue to hold all nonsteroidals and antiplatelets agents and anticoagulation for now patient is now 48 hours out after injury so I think it is okay to continue to leave her on the floor.  I think it is okay to start advancing her diet and work with physical Occupational Therapy if primary medical team in agreement  LOS: 1 day     Chevella Pearce P 06/29/2019, 9:27 AM

## 2019-06-29 NOTE — Progress Notes (Addendum)
PROGRESS NOTE  Lindsey Rowe  DOB: October 08, 1945  PCP: Kelton Pillar, MD NIO:[id number]  DOA: 06/28/2019  LOS: 1 day   Chief Complaint  Patient presents with  . Fall   Brief narrative: Lindsey Rowe is a 74 y.o. female with PMH of bipolar disorder, hypertension, hypothyroidism, osteoarthritis, impaired gait who lives alone at home, uses a walker. Patient presented to the ED on 5/23 after a fall.  Patient had a fall at home about 48 hours ago and was unable to get up.  Patient states she might have had multiple falls.  Eventually she was able to crawl and get in contact with the neighbors.  EMS was called and patient was brought to the ED.  In the ED, patient hemodynamically stable. X-rays were largely unremarkable but on exam patient does have right temporal ecchymosis and also ecchymosis in the right shoulder.  EKG showed normal sinus rhythm with low voltage.   Labs showed CK levels were mildly elevated at 340.  Hemoglobin 11.7. Covid test was negative.  Neurosurgery was consulted.  Subjective: Patient was seen and examined this morning.  Pleasant elderly Caucasian female.  Propped up in bed.  Not in distress.  Taking her breakfast. Mental status normal.  Pending PT/OT eval. States that she has been having falls for last several years because of her bad knees and inability to get knee replaced.  Few days ago, she fell multiple times and could not get up. Chart reviewed. No fever, hemodynamically stable. CK level this morning slightly low at 323, lactic acid level normal.  Assessment/Plan: Acute subdural hematoma  -Presented for fall.  -CT head showed large acute SDH measuring 8mm at maximum thickness with 73mm of shift. -Neurosurgery consult appreciated.  Per their note, because of her brain atrophy, her brain is able to compensate for the size of SDH and hence she clinically looks good.  She does not require surgical intervention.   -Avoid NSAIDs, antiplatelet and  anticoagulation. Remains on PPI. -Since admission, patient is also remain on Decadron IV and Keppra. -Per neurosurgeon Dr. Saintclair Halsted, Keppra to continue for 7 days, dexamethasone for 3 to 4 days.  -Recommended not to resume NSAIDs.  SCDs for DVT prophylaxis.   Essential hypertension -Takes lisinopril 10 mg daily at home.   -Continue to monitor blood pressure.  Hyperlipidemia - Continue pravastatin  Hypothyroidism - Continue Synthroid  Psych disorder -Multiple meds at home including BuSpar 10 mg twice daily, Lyrica 100 mg twice daily, Cymbalta 30 mg twice daily, Zanaflex 2 mg 3 times daily as needed, Ativan 1 mg twice daily as needed. -All resumed.  Morbid obesity - Body mass index is 41.63 kg/m. Patient has been advised to make an attempt to improve diet and exercise patterns to aid in weight loss.  Mobility: Encourage ambulation.  PT eval Code Status:  DNR per chart  DVT prophylaxis:  SCDs Antimicrobials:  None Fluid: Continue normal saline at 100/h Diet: Remain NPO.  Since there is no plan of surgery, I will start the patient on dysphagia diet.  ST eval pending.  Consultants: Neurosurgery Family Communication:  Not at bedside  Status is: Inpatient  Remains inpatient appropriate because:Ongoing diagnostic testing needed not appropriate for outpatient work up   Dispo: The patient is from: Home              Anticipated d/c is to: Pending PT evaluation              Anticipated d/c date is: 2 days  Patient currently is not medically stable to d/c.   Antimicrobials: Anti-infectives (From admission, onward)   None        Code Status: DNR   Diet Order            Diet regular Room service appropriate? Yes; Fluid consistency: Thin  Diet effective now              Infusions:  . sodium chloride 100 mL/hr at 06/29/19 1615    Scheduled Meds: . dexamethasone  6 mg Oral TID  . levETIRAcetam  500 mg Oral BID  . [START ON 06/30/2019] levothyroxine  75 mcg Oral  Q0600  . pantoprazole  40 mg Oral Daily    PRN meds: acetaminophen, albuterol, fluticasone, LORazepam, morphine injection, ondansetron **OR** ondansetron (ZOFRAN) IV, tiZANidine   Objective: Vitals:   06/29/19 1221 06/29/19 1556  BP: 116/69 104/64  Pulse: 69 62  Resp: 18 18  Temp: 97.7 F (36.5 C) 97.9 F (36.6 C)  SpO2: 100% 98%    Intake/Output Summary (Last 24 hours) at 06/29/2019 1635 Last data filed at 06/29/2019 1615 Gross per 24 hour  Intake 2272.44 ml  Output --  Net 2272.44 ml   Filed Weights   06/28/19 1658  Weight: 106.6 kg   Weight change:  Body mass index is 41.63 kg/m.   Physical Exam: General exam: Appears calm and comfortable.  Skin: No rashes, lesions or ulcers. HEENT: Atraumatic, normocephalic, supple neck, no obvious bleeding Lungs: Clear to auscultate bilaterally CVS: Regular rate and rhythm, no murmur GI/Abd soft, nontender, nondistended, bowel sound present CNS: Alert, awake, oriented x3 at baseline Psychiatry: Mood appropriate Extremities: No pedal edema, no calf tenderness  Data Review: I have personally reviewed the laboratory data and studies available.  Recent Labs  Lab 06/28/19 1813  WBC 5.4  NEUTROABS 3.1  HGB 11.7*  HCT 38.2  MCV 96.2  PLT 165   Recent Labs  Lab 06/28/19 1813  NA 142  K 3.6  CL 108  CO2 26  GLUCOSE 96  BUN 15  CREATININE 0.81  CALCIUM 8.4*   Signed, Lorin Glass, MD [facility] Pager: [phone] (Secure Chat preferred). 06/29/2019

## 2019-06-30 LAB — GLUCOSE, CAPILLARY
Glucose-Capillary: 144 mg/dL — ABNORMAL HIGH (ref 70–99)
Glucose-Capillary: 163 mg/dL — ABNORMAL HIGH (ref 70–99)
Glucose-Capillary: 119 mg/dL — ABNORMAL HIGH (ref 70–99)

## 2019-06-30 MED ORDER — DM-GUAIFENESIN ER 30-600 MG PO TB12
1.0000 | ORAL_TABLET | Freq: Two times a day (BID) | ORAL | Status: DC
  Administered 2019-06-30: 1 via ORAL
  Filled 2019-06-30: qty 1

## 2019-06-30 MED ORDER — ZOLPIDEM TARTRATE 5 MG PO TABS: 5.0000 mg | ORAL_TABLET | Freq: Every evening | ORAL | Status: DC | PRN

## 2019-06-30 MED ORDER — LORAZEPAM 1 MG PO TABS
1.0000 mg | ORAL_TABLET | Freq: Two times a day (BID) | ORAL | Status: DC | PRN
  Administered 2019-06-30 – 2019-07-01 (×2): 1 mg via ORAL
  Filled 2019-06-30 (×2): qty 1

## 2019-06-30 MED ORDER — DEXAMETHASONE 4 MG PO TABS
6.0000 mg | ORAL_TABLET | Freq: Two times a day (BID) | ORAL | Status: DC
  Administered 2019-06-30: 6 mg via ORAL
  Filled 2019-06-30: qty 1

## 2019-06-30 NOTE — TOC Initial Note (Signed)
Transition of Care [facility]) - Initial/Assessment Note    Patient Details  Name: Lindsey Rowe MRN: [id number] Date of Birth: 15-Jul-1945  Transition of Care [facility]) CM/SW Contact:    Geralynn Ochs, LCSW Phone Number: 06/30/2019, 2:16 PM  Clinical Narrative:    CSW met with patient to discuss recommendation for SNF. Patient agreeable, says she has been to [facility] before and they helped her a lot with rehab. Patient seemed somewhat slightly confused as she was asking several times about how to use the remote, but then said that she knew how to use it just was having trouble seeing without her glasses. CSW sent referral to [facility] and they are reviewing. CSW to send in insurance authorization request. CSW to follow.         Expected Discharge Plan: Skilled Nursing Facility Barriers to Discharge: Insurance Authorization, Continued Medical Work up   Patient Goals and CMS Choice Patient states their goals for this hospitalization and ongoing recovery are:: get rehab and get back home CMS Medicare.gov Compare Post Acute Care list provided to:: Patient Choice offered to / list presented to : Patient  Expected Discharge Plan and Services Expected Discharge Plan: [place] Choice: [place] arrangements for the past 2 months: Single Family Home                                      Prior Living Arrangements/Services Living arrangements for the past 2 months: Single Family Home Lives with:: Self Patient language and need for interpreter reviewed:: No Do you feel safe going back to the place where you live?: Yes      Need for Family Participation in Patient Care: No (Comment) Care giver support system in place?: No (comment)   Criminal Activity/Legal Involvement Pertinent to Current Situation/Hospitalization: No - Comment as needed  Activities of Daily Living      Permission Sought/Granted Permission sought to  share information with : Chartered certified accountant granted to share information with : Yes, Verbal Permission Granted     Permission granted to share info w AGENCY: SNF        Emotional Assessment Appearance:: Appears stated age Attitude/Demeanor/Rapport: Engaged Affect (typically observed): Pleasant Orientation: : Oriented to Self, Oriented to Place, Oriented to  Time, Oriented to Situation Alcohol / Substance Use: Not Applicable Psych Involvement: No (comment)  Admission diagnosis:  Subdural hematoma ([place]) [S06.5X9A] Fall, initial encounter [W19.XXXA] Ambulatory dysfunction [R26.2] Patient Active Problem List   Diagnosis Date Noted  . Subdural hematoma ([place]) 06/28/2019  . COPD exacerbation ([place]) 10/08/2018  . Asthmatic bronchitis 12/02/2017  . Acute respiratory failure with hypoxia ([place]) 06/27/2017  . Diastolic dysfunction 16/38/4665  . Acute lower UTI 06/27/2017  . Acute metabolic encephalopathy 99/35/7017  . Altered mental state 06/10/2017  . Dyspnea and respiratory abnormalities 08/29/2016  . Hypoxemia 08/29/2016  . Shoulder dislocation, right, initial encounter 05/24/2016  . 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 Hyperglycemia 05/24/2016  . Anterior dislocation of right shoulder   . Syncope 03/24/2016  . Generalized weakness 09/24/2015  . Leg pain, bilateral 09/24/2015  . Depression 09/24/2015  . Loss of appetite 09/24/2015  . Leg weakness, bilateral   . Hypothyroid 12/28/2012  . Hypovolemia 12/28/2012  . Hypotension 08/13/2012  . Dehydration 08/13/2012  . AKI (acute kidney injury) ([place]) 08/13/2012  . Hypoventilation associated with obesity  syndrome ([place]) 08/13/2012  . Acute respiratory failure ([place]) 07/26/2012  . Chest pain 07/26/2012  . ANKLE SPRAIN, RIGHT 03/16/2010  . DIVERTICULOSIS, COLON 10/31/2009  . SKIN LESION 09/23/2009  . Shoulder pain, right 07/05/2009  . COUGH 03/29/2009  . Migraine 03/11/2009  . OTITIS MEDIA, RIGHT  12/13/2008  . BRUISE 12/06/2008  . ACID REFLUX DISEASE 09/13/2008  . ALLERGIC RHINITIS 05/20/2008  . UNSPECIFIED DISEASE OF HAIR AND HAIR FOLLICLES 92/02/69  . DERMATITIS 11/12/2007  . OBESITY 07/18/2007  . HYPERCHOLESTEROLEMIA 05/13/2007  . BIPOLAR AFFECTIVE DISORDER 05/12/2007  . HYPERTENSION, BENIGN ESSENTIAL 05/12/2007  . Low back pain potentially associated with radiculopathy 05/12/2007   PCP:  Kelton Pillar, MD Pharmacy:   [facility] [place], [address] [place] [address] Phone: [phone] Fax: [phone]  [facility] - [address], [place] [address] Phone: [phone] Fax: [phone]     Social Determinants of Health (SDOH) Interventions    Readmission Risk Interventions No flowsheet data found.

## 2019-06-30 NOTE — Progress Notes (Signed)
PROGRESS NOTE  Lindsey Rowe  DOB: July 20, 1945  PCP: Maurice Small, MD QQP:[id number]  DOA: 06/28/2019  LOS: 2 days   Chief Complaint  Patient presents with  . Fall   Brief narrative: Lindsey Rowe is a 74 y.o. female with PMH of bipolar disorder, hypertension, hypothyroidism, osteoarthritis, impaired gait who lives alone at home, uses a walker. Patient presented to the ED on 5/23 after a fall.  Patient had a fall at home about 48 hours ago and was unable to get up.  Patient states she might have had multiple falls.  Eventually she was able to crawl and get in contact with the neighbors.  EMS was called and patient was brought to the ED.  In the ED, patient hemodynamically stable. X-rays were largely unremarkable but on exam patient does have right temporal ecchymosis and also ecchymosis in the right shoulder.  EKG showed normal sinus rhythm with low voltage.   Labs showed CK levels were mildly elevated at 340.  Hemoglobin 11.7. Covid test was negative.  Neurosurgery was consulted. Surgical intervention was not recommended.   Patient is pending placement to SNF.  Subjective: Patient was seen and examined this morning.  Pleasant elderly Caucasian female.  Lying down in bed.  Not in distress.  States that she had a rough night last night.  She has chronic insomnia and takes Ativan as needed at home.  She states he did not get it last night.  Per nursing note, patient was agitated and confused this morning. The time of my evaluation, patient was alert, awake, oriented but was feeling very tired.  Assessment/Plan: Acute subdural hematoma  -Presented for fall.  -CT head showed large acute SDH measuring 35mm at maximum thickness with 89mm of shift. -Neurosurgery consult appreciated.  Per their note, because of her brain atrophy, her brain is able to compensate for the size of SDH and hence she clinically looks good.  She does not require surgical intervention.   -Repeat CT scan  next day did not show any change in the size of the hematoma. -Per neurosurgery recommendation by Dr. Wynetta Emery, patient is getting Keppra and dexamethasone.  Recommendation is to continue Keppra 5 mg twice daily for 7 days and Decadron for 3 to 4 days only. -Because of patient's agitation last night, I decided to reduce the frequency of Decadron 6 mg from 3 times daily to twice daily. -Avoid NSAIDs, antiplatelet, anticoagulation.  On SCD boots for DVT prophylaxis.  Essential hypertension -Continue lisinopril 10 mg daily from home.   -Continue to monitor blood pressure.  Hyperlipidemia - Continue pravastatin  Hypothyroidism - Continue Synthroid  Psych disorder -Multiple meds at home including BuSpar 10 mg twice daily, Lyrica 100 mg twice daily, Cymbalta 30 mg twice daily, Zanaflex 2 mg 3 times daily as needed, Ativan 1 mg twice daily as needed. -All resumed.  Morbid obesity - Body mass index is 41.63 kg/m. Patient has been advised to make an attempt to improve diet and exercise patterns to aid in weight loss.  Mobility: Encourage ambulation.  PT eval Code Status:  DNR per chart  DVT prophylaxis:  SCDs Antimicrobials:  None Fluid: Stop IV fluid today. Diet: Regular diet  Consultants: Neurosurgery signed off Family Communication:  Not at bedside  Status is: Inpatient  Remains inpatient -pending placement  Dispo: The patient is from: Home              Anticipated d/c is to: SNF  Anticipated d/c date is: When in SNF bed available              Patient currently is waiting for insurance authorization for SNF   Antimicrobials: Anti-infectives (From admission, onward)   None        Code Status: DNR   Diet Order            Diet regular Room service appropriate? Yes; Fluid consistency: Thin  Diet effective now              Infusions:    Scheduled Meds: . dexamethasone  6 mg Oral Q12H  . levETIRAcetam  500 mg Oral BID  . levothyroxine  75 mcg Oral Q0600    . pantoprazole  40 mg Oral Daily    PRN meds: acetaminophen, albuterol, fluticasone, LORazepam, morphine injection, ondansetron **OR** ondansetron (ZOFRAN) IV, tiZANidine   Objective: Vitals:   06/30/19 0305 06/30/19 0806  BP: (!) 148/81 126/67  Pulse: 73 77  Resp: 18 19  Temp: 97.8 F (36.6 C) 97.9 F (36.6 C)  SpO2: 99% 100%    Intake/Output Summary (Last 24 hours) at 06/30/2019 1110 Last data filed at 06/30/2019 1025 Gross per 24 hour  Intake 1143.21 ml  Output 1250 ml  Net -106.79 ml   Filed Weights   06/28/19 1658  Weight: 106.6 kg   Weight change:  Body mass index is 41.63 kg/m.   Physical Exam: General exam: Appears calm and comfortable.  Tired Skin: No rashes, lesions or ulcers. HEENT: Atraumatic, normocephalic, supple neck, no obvious bleeding Lungs: Clear to auscultate bilaterally CVS: Regular rate and rhythm, no murmur GI/Abd soft, nontender, nondistended, bowel sound present CNS: Alert, awake, oriented x3 at baseline Psychiatry: Mood appropriate Extremities: No pedal edema, no calf tenderness  Data Review: I have personally reviewed the laboratory data and studies available.  Recent Labs  Lab 06/28/19 1813  WBC 5.4  NEUTROABS 3.1  HGB 11.7*  HCT 38.2  MCV 96.2  PLT 165   Recent Labs  Lab 06/28/19 1813  NA 142  K 3.6  CL 108  CO2 26  GLUCOSE 96  BUN 15  CREATININE 0.81  CALCIUM 8.4*   Signed, Terrilee Croak, MD [facility] Pager: [phone] (Secure Chat preferred). 06/30/2019

## 2019-06-30 NOTE — Plan of Care (Signed)

## 2019-06-30 NOTE — TOC CAGE-AID Note (Signed)
Transition of Care [facility]) - CAGE-AID Screening   Patient Details  Name: Lindsey Rowe MRN: [id number] Date of Birth: January 19, 1946  [facility]) CM/SW Contact:    Jimmy Picket, [place] Phone Number: 06/30/2019, 3:25 PM   Clinical Narrative:  Pt denied alcohol use and substance use.   CAGE-AID Screening:    Have You Ever Felt You Ought to Cut Down on Your Drinking or Drug Use?: No Have People Annoyed You By Critizing Your Drinking Or Drug Use?: No Have You Felt Bad Or Guilty About Your Drinking Or Drug Use?: No Have You Ever Had a Drink or Used Drugs First Thing In The Morning to STeady Your Nerves or to Get Rid of a Hangover?: No CAGE-AID Score: 0  Substance Abuse Education Offered: No     Denita Lung, Bridget Hartshorn Clinical Social Worker [phone]

## 2019-06-30 NOTE — Progress Notes (Signed)
Physical Therapy Treatment Patient Details Name: Lindsey Rowe MRN: [id number] DOB: 1946/01/17 Today's Date: 06/30/2019    History of Present Illness 74 yo female with onset of falls has sustained a R frontal SDH with 14 mm thickness, with 7 mm shift from midline.  Has no fractures on imaging of R shoulder and pelvis.  PMHx:  bipolar disorder, migraines, back pain with lumbar microdiscectomy, cataract resection, cervical spondylosis with osteophytes on C3-4, C5-6 and C 6-7, rib fractures, abd aortic atherosclerosis,    PT Comments    Patient able to ambulate ~75 ft this session with RW and min-mod A due to impaired balance. Pt with tendency to drift and run into objects on R side requiring assistance to manage RW. Continue to progress as tolerated with anticipated d/c to SNF for further skilled PT services.      Follow Up Recommendations  SNF     Equipment Recommendations  None recommended by PT    Recommendations for Other Services       Precautions / Restrictions Precautions Precautions: Fall Restrictions Weight Bearing Restrictions: No    Mobility  Bed Mobility Overal bed mobility: Needs Assistance Bed Mobility: Supine to Sit     Supine to sit: Min guard     General bed mobility comments: use of rail and increased time and effort needed; min guard for safety  Transfers Overall transfer level: Needs assistance Equipment used: Rolling walker (2 wheeled);1 person hand held assist Transfers: Sit to/from [facility] Sit to Stand: Mod assist         General transfer comment: cues for safe hand placement; assist to power up into standing and to steady while transitioning hand placement   Ambulation/Gait Ambulation/Gait assistance: Min assist;Mod assist Gait Distance (Feet): 75 Feet Assistive device: Rolling walker (2 wheeled) Gait Pattern/deviations: Step-through pattern;Decreased stride length;Wide base of support;Drifts right/left Gait velocity:  decreased   General Gait Details: assistance required for balance and to manage RW; pt with tendency to drift to R side and running into objects on R side; cues for navigating environment   Stairs             Wheelchair Mobility    Modified Rankin (Stroke Patients Only)       Balance Overall balance assessment: History of Falls;Needs assistance Sitting-balance support: Feet supported;Bilateral upper extremity supported Sitting balance-Leahy Scale: Fair     Standing balance support: Bilateral upper extremity supported;During functional activity Standing balance-Leahy Scale: Poor                              Cognition Arousal/Alertness: Awake/alert Behavior During Therapy: WFL for tasks assessed/performed Overall Cognitive Status: Impaired/Different from baseline Area of Impairment: Problem solving;Safety/judgement;Following commands;Memory                     Memory: Decreased short-term memory Following Commands: Follows one step commands with increased time Safety/Judgement: Decreased awareness of safety;Decreased awareness of deficits   Problem Solving: Slow processing;Requires verbal cues;Decreased initiation General Comments: pt with word finding difficulties and increased time to respond       Exercises      General Comments        Pertinent Vitals/Pain Pain Assessment: Faces Faces Pain Scale: Hurts little more Pain Location: R side  Pain Descriptors / Indicators: Grimacing;Guarding Pain Intervention(s): Limited activity within patient's tolerance;Monitored during session;Repositioned;Patient requesting pain meds-RN notified    Home Living  Prior Function            PT Goals (current goals can now be found in the care plan section) Progress towards PT goals: Progressing toward goals    Frequency    Min 3X/week      PT Plan Current plan remains appropriate    Co-evaluation               AM-PAC PT "6 Clicks" Mobility   Outcome Measure  Help needed turning from your back to your side while in a flat bed without using bedrails?: A Little Help needed moving from lying on your back to sitting on the side of a flat bed without using bedrails?: A Little Help needed moving to and from a bed to a chair (including a wheelchair)?: A Lot Help needed standing up from a chair using your arms (e.g., wheelchair or bedside chair)?: A Lot Help needed to walk in hospital room?: A Lot Help needed climbing 3-5 steps with a railing? : Total 6 Click Score: 13    End of Session Equipment Utilized During Treatment: Gait belt;Oxygen(2 L ) Activity Tolerance: Patient tolerated treatment well Patient left: with call bell/phone within reach;in chair;with chair alarm set Nurse Communication: Mobility status PT Visit Diagnosis: Unsteadiness on feet (R26.81);Muscle weakness (generalized) (M62.81);Difficulty in walking, not elsewhere classified (R26.2)     Time: 2778-2423 PT Time Calculation (min) (ACUTE ONLY): 33 min  Charges:  $Gait Training: 23-37 mins                     Erline Levine, PTA Acute Rehabilitation Services Pager: [phone] Office: [phone]     Carolynne Edouard 06/30/2019, 3:46 PM

## 2019-06-30 NOTE — [place] FL2 (Signed)
[place] MEDICAID FL2 LEVEL OF CARE SCREENING TOOL     IDENTIFICATION  Patient Name: Lindsey Rowe Birthdate: 1945-10-27 Sex: female Admission Date (Current Location): 06/28/2019  [facility] and [place] Number:  Producer, television/film/video and Address:  [facility] [place], [address], [place], [place] [address]      Provider Number: [id number]  Attending Physician Name and Address:  Lorin Glass, MD  Relative Name and Phone Number:       Current Level of Care: Hospital Recommended Level of Care: Skilled Nursing Facility Prior Approval Number:    Date Approved/Denied:   PASRR Number: [id number]  Discharge Plan: SNF    Current Diagnoses: Patient Active Problem List   Diagnosis Date Noted  . Subdural hematoma (HCC) 06/28/2019  . COPD exacerbation (HCC) 10/08/2018  . Asthmatic bronchitis 12/02/2017  . Acute respiratory failure with hypoxia (HCC) 06/27/2017  . Diastolic dysfunction 06/27/2017  . Acute lower UTI 06/27/2017  . Acute metabolic encephalopathy 06/27/2017  . Altered mental state 06/10/2017  . Dyspnea and respiratory abnormalities 08/29/2016  . Hypoxemia 08/29/2016  . Shoulder dislocation, right, initial encounter 05/24/2016  . 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 Hyperglycemia 05/24/2016  . Anterior dislocation of right shoulder   . Syncope 03/24/2016  . Generalized weakness 09/24/2015  . Leg pain, bilateral 09/24/2015  . Depression 09/24/2015  . Loss of appetite 09/24/2015  . Leg weakness, bilateral   . Hypothyroid 12/28/2012  . Hypovolemia 12/28/2012  . Hypotension 08/13/2012  . Dehydration 08/13/2012  . AKI (acute kidney injury) (HCC) 08/13/2012  . Hypoventilation associated with obesity syndrome (HCC) 08/13/2012  . Acute respiratory failure (HCC) 07/26/2012  . Chest pain 07/26/2012  . ANKLE SPRAIN, RIGHT 03/16/2010  . DIVERTICULOSIS, COLON 10/31/2009  . SKIN LESION 09/23/2009  . Shoulder pain, right 07/05/2009  . COUGH  03/29/2009  . Migraine 03/11/2009  . OTITIS MEDIA, RIGHT 12/13/2008  . BRUISE 12/06/2008  . ACID REFLUX DISEASE 09/13/2008  . ALLERGIC RHINITIS 05/20/2008  . UNSPECIFIED DISEASE OF HAIR AND HAIR FOLLICLES 05/20/2008  . DERMATITIS 11/12/2007  . OBESITY 07/18/2007  . HYPERCHOLESTEROLEMIA 05/13/2007  . BIPOLAR AFFECTIVE DISORDER 05/12/2007  . HYPERTENSION, BENIGN ESSENTIAL 05/12/2007  . Low back pain potentially associated with radiculopathy 05/12/2007    Orientation RESPIRATION BLADDER Height & Weight     Self, Time, Situation, Place  O2([place] 2L) Incontinent Weight: 235 lb (106.6 kg) Height:  5\' 3"  (160 cm)  BEHAVIORAL SYMPTOMS/MOOD NEUROLOGICAL BOWEL NUTRITION STATUS      Continent Diet(regular)  AMBULATORY STATUS COMMUNICATION OF NEEDS Skin   Extensive Assist Verbally Skin abrasions(abrasions on arm, leg, sacrum)                       Personal Care Assistance Level of Assistance  Bathing, Feeding, Dressing Bathing Assistance: Maximum assistance Feeding assistance: Independent Dressing Assistance: Maximum assistance     Functional Limitations Info  Sight Sight Info: Impaired(wears glasses)        SPECIAL CARE FACTORS FREQUENCY  PT (By licensed PT), OT (By licensed OT)     PT Frequency: 5x/wk OT Frequency: 5x/wk            Contractures Contractures Info: Not present    Additional Factors Info  Code Status, Allergies, Psychotropic Code Status Info: DNR Allergies Info: Gabapentin, Cefadroxil Psychotropic Info: Cymbalta 30mg  2x/day; Buspar 10mg  2x/day; Ativan 1mg  2x/day PRN         Current Medications (06/30/2019):  This is the current  hospital active medication list Current Facility-Administered Medications  Medication Dose Route Frequency Provider Last Rate Last Admin  . acetaminophen (TYLENOL) tablet 650 mg  650 mg Oral Q6H PRN Terrilee Croak, MD   650 mg at 06/29/19 1409  . albuterol (PROVENTIL) (2.5 MG/3ML) 0.083% nebulizer solution 3 mL  3 mL  Inhalation Q6H PRN Dahal, Binaya, MD      . dexamethasone (DECADRON) tablet 6 mg  6 mg Oral Q12H Dahal, Binaya, MD      . fluticasone (FLONASE) 50 MCG/ACT nasal spray 1 spray  1 spray Each Nare Daily PRN Dahal, Marlowe Aschoff, MD      . levETIRAcetam (KEPPRA) tablet 500 mg  500 mg Oral BID Terrilee Croak, MD   500 mg at 06/30/19 0914  . levothyroxine (SYNTHROID) tablet 75 mcg  75 mcg Oral Q0600 Terrilee Croak, MD   75 mcg at 06/30/19 0515  . LORazepam (ATIVAN) tablet 1 mg  1 mg Oral BID PRN Dahal, Marlowe Aschoff, MD      . morphine 2 MG/ML injection 1 mg  1 mg Intravenous Q4H PRN Dahal, Marlowe Aschoff, MD   1 mg at 06/30/19 0422  . ondansetron (ZOFRAN) tablet 4 mg  4 mg Oral Q6H PRN Dahal, Marlowe Aschoff, MD       Or  . ondansetron (ZOFRAN) injection 4 mg  4 mg Intravenous Q6H PRN Dahal, Marlowe Aschoff, MD   4 mg at 06/30/19 0429  . pantoprazole (PROTONIX) EC tablet 40 mg  40 mg Oral Daily Dahal, Marlowe Aschoff, MD   40 mg at 06/30/19 0913  . tiZANidine (ZANAFLEX) tablet 2 mg  2 mg Oral TID PRN Terrilee Croak, MD   2 mg at 06/29/19 1351     Discharge Medications: Please see discharge summary for a list of discharge medications.  Relevant Imaging Results:  Relevant Lab Results:   Additional Information SS#: [id number]  Geralynn Ochs, LCSW
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
## 2019-07-01 ENCOUNTER — Encounter: Payer: Self-pay | Admitting: *Deleted

## 2019-07-01 ENCOUNTER — Ambulatory Visit: Payer: Medicare HMO | Admitting: Psychology

## 2019-07-01 ENCOUNTER — Other Ambulatory Visit: Payer: Self-pay | Admitting: *Deleted

## 2019-07-01 ENCOUNTER — Inpatient Hospital Stay (HOSPITAL_COMMUNITY): Payer: Medicare HMO

## 2019-07-01 LAB — BASIC METABOLIC PANEL
Anion gap: 8 (ref 5–15)
BUN: 13 mg/dL (ref 8–23)
CO2: 26 mmol/L (ref 22–32)
Calcium: 7.9 mg/dL — ABNORMAL LOW (ref 8.9–10.3)
Chloride: 105 mmol/L (ref 98–111)
Creatinine, Ser: 0.87 mg/dL (ref 0.44–1.00)
GFR calc Af Amer: >60 mL/min (ref >60)
GFR calc non Af Amer: >60 mL/min (ref >60)
Glucose, Bld: 117 mg/dL — ABNORMAL HIGH (ref 70–99)
Potassium: 4.1 mmol/L (ref 3.5–5.1)
Sodium: 139 mmol/L (ref 135–145)

## 2019-07-01 LAB — GLUCOSE, CAPILLARY
Glucose-Capillary: 105 mg/dL — ABNORMAL HIGH (ref 70–99)
Glucose-Capillary: 109 mg/dL — ABNORMAL HIGH (ref 70–99)
Glucose-Capillary: 109 mg/dL — ABNORMAL HIGH (ref 70–99)

## 2019-07-01 LAB — SARS CORONAVIRUS 2 BY RT PCR (HOSPITAL ORDER, PERFORMED IN ~~LOC~~ HOSPITAL LAB): SARS Coronavirus 2: NEGATIVE

## 2019-07-01 MED ORDER — DEXAMETHASONE 2 MG PO TABS
2.0000 mg | ORAL_TABLET | Freq: Two times a day (BID) | ORAL | Status: DC
  Administered 2019-07-01 – 2019-07-02 (×3): 2 mg via ORAL
  Filled 2019-07-01 (×3): qty 1

## 2019-07-01 MED ORDER — GUAIFENESIN 100 MG/5ML PO SOLN
10.0000 mL | ORAL | Status: DC | PRN
  Administered 2019-07-01 – 2019-07-02 (×3): 200 mg via ORAL
  Filled 2019-07-01 (×3): qty 10

## 2019-07-01 MED ORDER — POLYETHYLENE GLYCOL 3350 17 G PO PACK
17.0000 g | PACK | Freq: Every day | ORAL | Status: DC
  Administered 2019-07-01 – 2019-07-02 (×2): 17 g via ORAL
  Filled 2019-07-01 (×2): qty 1

## 2019-07-01 NOTE — Progress Notes (Signed)
PROGRESS NOTE  Lindsey Rowe  DOB: [birth year]  PCP: Maurice Small, MD QQV:[id number]  DOA: 06/28/2019  LOS: 3 days   Chief Complaint  Patient presents with  . Fall   Brief narrative: Lindsey Rowe is a 74 y.o. female with PMH of bipolar disorder, hypertension, hypothyroidism, osteoarthritis, impaired gait who lives alone at home, uses a walker. Patient presented to the ED on 5/23 after a fall.  Patient had a fall at home about 48 hours ago and was unable to get up. Patient states she might have had multiple falls.  Eventually she was able to crawl and get in contact with the neighbors.  EMS was called and patient was brought to the ED.  In the ED, patient hemodynamically stable. X-rays were largely unremarkable but on exam patient does have right temporal ecchymosis and also ecchymosis in the right shoulder. EKG showed normal sinus rhythm with low voltage.  Labs showed CK levels were mildly elevated at 340.  Hemoglobin 11.7. Covid test was negative.  CT scan of the head showed subdural hematoma.Neurosurgery was consulted. Surgical intervention was not recommended. Patient is pending placement to SNF.  Subjective: Patient seen and examined.  No overnight events. She does however has increasing cough.  She thinks he might have got some cold when she was on the floor for 2 days. Afebrile.  A chest x-ray was ordered by neurosurgery service, looks fairly stable.  Does not have any evidence of pneumonia.  She will need repeat swallow evaluation today.  Assessment/Plan: Acute traumatic subdural hematoma  -CT head showed large acute SDH measuring 58mm at maximum thickness with 58mm of shift. -Neurosurgery consult appreciated.  Planning conservative management.  Subsequent CT scan stable.  -Repeat CT scan ordered for 5/27.  -Per neurosurgery recommendation by Dr. Wynetta Emery, patient is getting Keppra and dexamethasone.  Recommendation is to continue Keppra 5 mg twice daily for 7 days and  Decadron for 3 to 4 days only. -Avoid NSAIDs, antiplatelet, anticoagulation.  On SCD boots for DVT prophylaxis.  Essential hypertension -Continue lisinopril 10 mg daily from home.   -Continue to monitor blood pressure.  Hyperlipidemia - Continue pravastatin  Hypothyroidism - Continue Synthroid  Psych disorder -Multiple meds at home including BuSpar 10 mg twice daily, Lyrica 100 mg twice daily, Cymbalta 30 mg twice daily, Zanaflex 2 mg 3 times daily as needed, Ativan 1 mg twice daily as needed. -All resumed.  Morbid obesity - Body mass index is 41.63 kg/m. Patient has been advised to make an attempt to improve diet and exercise patterns to aid in weight loss.  Mobility: Encourage ambulation.  PT eval Code Status:  DNR per chart  DVT prophylaxis:  SCDs Antimicrobials:  None Fluid: None. Diet: Regular diet  Consultants: Neurosurgery following. Family Communication:  Not at bedside  Status is: Inpatient  Remains inpatient -pending placement  Dispo: The patient is from: Home              Anticipated d/c is to: SNF              Anticipated d/c date is: Advertising account executive.              Patient currently is waiting for insurance authorization for SNF.  Will have repeat CT scan tomorrow morning.   Antimicrobials: Anti-infectives (From admission, onward)   None        Code Status: DNR   Diet Order            Diet regular Room service  appropriate? Yes; Fluid consistency: Thin  Diet effective now              Infusions:    Scheduled Meds: . dexamethasone  2 mg Oral Q12H  . levETIRAcetam  500 mg Oral BID  . levothyroxine  75 mcg Oral Q0600  . pantoprazole  40 mg Oral Daily  . polyethylene glycol  17 g Oral Daily    PRN meds: acetaminophen, albuterol, fluticasone, guaiFENesin, LORazepam, morphine injection, ondansetron **OR** ondansetron (ZOFRAN) IV, tiZANidine   Objective: Vitals:   07/01/19 0740 07/01/19 1205  BP: (!) 150/81 129/76  Pulse: 79 73  Resp: 20 16    Temp: 97.6 F (36.4 C) 98.5 F (36.9 C)  SpO2: 98% 94%    Intake/Output Summary (Last 24 hours) at 07/01/2019 1321 Last data filed at 07/01/2019 0556 Gross per 24 hour  Intake --  Output 1025 ml  Net -1025 ml   Filed Weights   06/28/19 1658  Weight: 106.6 kg   Weight change:  Body mass index is 41.63 kg/m.   Physical Exam: General exam: Appears calm and comfortable.  Dry cough present. Skin: No rashes, lesions or ulcers. HEENT: Atraumatic, normocephalic, supple neck, no obvious bleeding Lungs: Clear to auscultate bilaterally, conducted airway sounds. CVS: Regular rate and rhythm, no murmur GI/Abd soft, nontender, nondistended, bowel sound present CNS: Alert, awake, oriented x3 at baseline Psychiatry: Mood appropriate Extremities: No pedal edema, no calf tenderness  Data Review: I have personally reviewed the laboratory data and studies available.  Recent Labs  Lab 06/28/19 1813  WBC 5.4  NEUTROABS 3.1  HGB 11.7*  HCT 38.2  MCV 96.2  PLT 165   Recent Labs  Lab 06/28/19 1813 07/01/19 1049  NA 142 139  K 3.6 4.1  CL 108 105  CO2 26 26  GLUCOSE 96 117*  BUN 15 13  CREATININE 0.81 0.87  CALCIUM 8.4* 7.9*   Signed, Barb Merino, MD [facility]   Total time spent: 25 minutes

## 2019-07-01 NOTE — TOC Progression Note (Signed)
Transition of Care [facility]) - Progression Note    Patient Details  Name: Lindsey Rowe MRN: [id number] Date of Birth: [birth year]  Transition of Care [facility]) CM/SW Contact  Baldemar Lenis, [place] Phone Number: 07/01/2019, 3:48 PM  Clinical Narrative:   CSW notified by Malvin Johns that authorization has been received. Patient not medically stable at this time. Patient will need updated COVID test, asked MD to order. CSW to follow.    Expected Discharge Plan: Skilled Nursing Facility Barriers to Discharge: Continued Medical Work up  Expected Discharge Plan and Services Expected Discharge Plan: Skilled Nursing Facility     Post Acute Care Choice: Skilled Nursing Facility Living arrangements for the past 2 months: Single Family Home                                       Social Determinants of Health (SDOH) Interventions    Readmission Risk Interventions No flowsheet data found.

## 2019-07-01 NOTE — Progress Notes (Signed)
Subjective: Patient reports headaches have improved quite a bit. Complaining of lower back pain with some radicular leg pain which is not new for her. Cough seems to be getting worse Objective: Vital signs in last 24 hours: Temp:  [97.5 F (36.4 C)-98.4 F (36.9 C)] 97.6 F (36.4 C) (05/26 0740) Pulse Rate:  [76-98] 79 (05/26 0740) Resp:  [16-20] 20 (05/26 0740) BP: (130-172)/(70-89) 150/81 (05/26 0740) SpO2:  [96 %-99 %] 98 % (05/26 0740)  Intake/Output from previous day: 05/25 0701 - 05/26 0700 In: -  Out: 1575 [Urine:1575] Intake/Output this shift: No intake/output data recorded.  Neurologic: Grossly normal  Lab Results: Lab Results  Component Value Date   WBC 5.4 06/28/2019   HGB 11.7 (L) 06/28/2019   HCT 38.2 06/28/2019   MCV 96.2 06/28/2019   PLT 165 06/28/2019   Lab Results  Component Value Date   INR 0.98 06/09/2017   BMET Lab Results  Component Value Date   NA 142 06/28/2019   K 3.6 06/28/2019   CL 108 06/28/2019   CO2 26 06/28/2019   GLUCOSE 96 06/28/2019   BUN 15 06/28/2019   CREATININE 0.81 06/28/2019   CALCIUM 8.4 (L) 06/28/2019    Studies/Results: CT HEAD WO CONTRAST  Result Date: 06/29/2019 CLINICAL DATA:  Follow-up subdural hemorrhage EXAM: CT HEAD WITHOUT CONTRAST TECHNIQUE: Contiguous axial images were obtained from the base of the skull through the vertex without intravenous contrast. COMPARISON:  Yesterday FINDINGS: Brain: High-density subdural hematoma along the right cerebral convexity that measures up to 14 mm in thickness at the lateral frontal lobe. Minimal subdural hemorrhage is seen along the falx. No progression of hemorrhage. Leftward midline shift measures 6 mm as measured on coronal reformats. No entrapment or infarct. No new site of hemorrhage seen. Vascular: Negative Skull: No acute fracture. Sinuses/Orbits: Bilateral cataract resection. IMPRESSION: Unchanged subdural hematoma on the right with up to 6 mm of midline shift.  Electronically Signed   By: Marnee Spring M.D.   On: 06/29/2019 10:53    Assessment/Plan: 74 year old female here for a large right SDH. Ordered a chest xray and bmet. Will order another CT head for the morning. Continue therapy and mobilize.    LOS: 3 days    Tiana Loft [facility] 07/01/2019, 10:11 AM

## 2019-07-01 NOTE — Plan of Care (Signed)

## 2019-07-01 NOTE — Patient Outreach (Addendum)
[facility]) Care Management  07/01/2019  Lindsey Rowe 08/13/1945 [id number]   CSW noted that patient was hospitalized on Sunday, Jun 28, 2019, due to frequent falls, generalized weakness and a large right frontal acute subdural hematoma.  Patient remains hospitalized while awaiting results of a chest x-ray and CT of her head.  Patient has been evaluated by physical therapy and occupational therapy, both recommending that patient receive short-term rehabilitative services in a skilled nursing facility, upon discharge from the hospital.  Patient is agreeable and would like to return to [facility].  Patient's FL-2 Form has been faxed to all facilities of interest, we are just awaiting bed offers.  CSW will perform a case closure on patient, as all goals of treatment have been met from social work standpoint and no additional social work needs have been identified at this time.  CSW will notify patient's Telephonic RNCM with [facility], Lindsey Rowe of CSW's plans to close patient's case.  CSW will fax an update to patient's Primary Care Physician, Dr. Kelton Pillar to ensure that she is aware of CSW's involvement with patient's plan of care.  Please re-refer patient back to [facility] if additional social work needs are identified or if patient decides to discharge home.  Nat Christen, BSW, MSW, LCSW  Licensed Education officer, [facility]  Mailing [place] [address], [place], [place] [address] Physical Address-[address], [place], [place] [address] Toll Free Main # [phone] Fax # [phone] Cell # [phone]  Office # [phone] [name]    At the request of [facility] Licensed Clinical Social Worker, [place] was able to arrange transportation for patient,  through [facility], to the [facility] COVID-19 Testing Site, to obtain her COVID-19 Screening, on Saturday, Jul 04, 2019.  Patient will be transported from [facility] and [facility] [place] where she currently resides to receive short-term rehabilitative services, to the Testing Site off of [address] in [place].  Patient will then be transported back to [place] after the screening is complete.

## 2019-07-01 NOTE — Progress Notes (Signed)
Pt noted to rest longer tonight versus last night.
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
## 2019-07-02 ENCOUNTER — Inpatient Hospital Stay (HOSPITAL_COMMUNITY): Payer: Medicare HMO

## 2019-07-02 DIAGNOSIS — M79605 Pain in left leg: Secondary | ICD-10-CM | POA: Diagnosis not present

## 2019-07-02 DIAGNOSIS — G44209 Tension-type headache, unspecified, not intractable: Secondary | ICD-10-CM | POA: Diagnosis present

## 2019-07-02 DIAGNOSIS — D72829 Elevated white blood cell count, unspecified: Secondary | ICD-10-CM | POA: Diagnosis not present

## 2019-07-02 DIAGNOSIS — Z8249 Family history of ischemic heart disease and other diseases of the circulatory system: Secondary | ICD-10-CM | POA: Diagnosis not present

## 2019-07-02 DIAGNOSIS — J9611 Chronic respiratory failure with hypoxia: Secondary | ICD-10-CM | POA: Diagnosis not present

## 2019-07-02 DIAGNOSIS — R404 Transient alteration of awareness: Secondary | ICD-10-CM | POA: Diagnosis not present

## 2019-07-02 DIAGNOSIS — M6281 Muscle weakness (generalized): Secondary | ICD-10-CM | POA: Diagnosis not present

## 2019-07-02 DIAGNOSIS — Z7401 Bed confinement status: Secondary | ICD-10-CM | POA: Diagnosis not present

## 2019-07-02 DIAGNOSIS — R0989 Other specified symptoms and signs involving the circulatory and respiratory systems: Secondary | ICD-10-CM | POA: Diagnosis not present

## 2019-07-02 DIAGNOSIS — Z9884 Bariatric surgery status: Secondary | ICD-10-CM | POA: Diagnosis not present

## 2019-07-02 DIAGNOSIS — J939 Pneumothorax, unspecified: Secondary | ICD-10-CM | POA: Diagnosis not present

## 2019-07-02 DIAGNOSIS — K219 Gastro-esophageal reflux disease without esophagitis: Secondary | ICD-10-CM | POA: Diagnosis not present

## 2019-07-02 DIAGNOSIS — J811 Chronic pulmonary edema: Secondary | ICD-10-CM | POA: Diagnosis not present

## 2019-07-02 DIAGNOSIS — I6200 Nontraumatic subdural hemorrhage, unspecified: Secondary | ICD-10-CM | POA: Diagnosis not present

## 2019-07-02 DIAGNOSIS — F419 Anxiety disorder, unspecified: Secondary | ICD-10-CM | POA: Diagnosis not present

## 2019-07-02 DIAGNOSIS — Z825 Family history of asthma and other chronic lower respiratory diseases: Secondary | ICD-10-CM | POA: Diagnosis not present

## 2019-07-02 DIAGNOSIS — S065X9A Traumatic subdural hemorrhage with loss of consciousness of unspecified duration, initial encounter: Secondary | ICD-10-CM | POA: Diagnosis not present

## 2019-07-02 DIAGNOSIS — J45909 Unspecified asthma, uncomplicated: Secondary | ICD-10-CM | POA: Diagnosis not present

## 2019-07-02 DIAGNOSIS — I10 Essential (primary) hypertension: Secondary | ICD-10-CM | POA: Diagnosis not present

## 2019-07-02 DIAGNOSIS — M79604 Pain in right leg: Secondary | ICD-10-CM | POA: Diagnosis not present

## 2019-07-02 DIAGNOSIS — G43909 Migraine, unspecified, not intractable, without status migrainosus: Secondary | ICD-10-CM | POA: Diagnosis present

## 2019-07-02 DIAGNOSIS — Z888 Allergy status to other drugs, medicaments and biological substances status: Secondary | ICD-10-CM | POA: Diagnosis not present

## 2019-07-02 DIAGNOSIS — S065X0D Traumatic subdural hemorrhage without loss of consciousness, subsequent encounter: Secondary | ICD-10-CM | POA: Diagnosis not present

## 2019-07-02 DIAGNOSIS — G9349 Other encephalopathy: Secondary | ICD-10-CM | POA: Diagnosis not present

## 2019-07-02 DIAGNOSIS — R519 Headache, unspecified: Secondary | ICD-10-CM | POA: Diagnosis not present

## 2019-07-02 DIAGNOSIS — J810 Acute pulmonary edema: Secondary | ICD-10-CM | POA: Diagnosis not present

## 2019-07-02 DIAGNOSIS — S065X0A Traumatic subdural hemorrhage without loss of consciousness, initial encounter: Secondary | ICD-10-CM | POA: Diagnosis not present

## 2019-07-02 DIAGNOSIS — G935 Compression of brain: Secondary | ICD-10-CM | POA: Diagnosis not present

## 2019-07-02 DIAGNOSIS — S2231XA Fracture of one rib, right side, initial encounter for closed fracture: Secondary | ICD-10-CM | POA: Diagnosis not present

## 2019-07-02 DIAGNOSIS — Z79890 Hormone replacement therapy (postmenopausal): Secondary | ICD-10-CM | POA: Diagnosis not present

## 2019-07-02 DIAGNOSIS — Z20828 Contact with and (suspected) exposure to other viral communicable diseases: Secondary | ICD-10-CM | POA: Diagnosis not present

## 2019-07-02 DIAGNOSIS — Z66 Do not resuscitate: Secondary | ICD-10-CM | POA: Diagnosis not present

## 2019-07-02 DIAGNOSIS — J449 Chronic obstructive pulmonary disease, unspecified: Secondary | ICD-10-CM | POA: Diagnosis not present

## 2019-07-02 DIAGNOSIS — E039 Hypothyroidism, unspecified: Secondary | ICD-10-CM | POA: Diagnosis not present

## 2019-07-02 DIAGNOSIS — G9340 Encephalopathy, unspecified: Secondary | ICD-10-CM | POA: Diagnosis not present

## 2019-07-02 DIAGNOSIS — W19XXXA Unspecified fall, initial encounter: Secondary | ICD-10-CM | POA: Diagnosis not present

## 2019-07-02 DIAGNOSIS — J181 Lobar pneumonia, unspecified organism: Secondary | ICD-10-CM | POA: Diagnosis not present

## 2019-07-02 DIAGNOSIS — J9601 Acute respiratory failure with hypoxia: Secondary | ICD-10-CM | POA: Diagnosis not present

## 2019-07-02 DIAGNOSIS — J441 Chronic obstructive pulmonary disease with (acute) exacerbation: Secondary | ICD-10-CM | POA: Diagnosis not present

## 2019-07-02 DIAGNOSIS — F319 Bipolar disorder, unspecified: Secondary | ICD-10-CM | POA: Diagnosis not present

## 2019-07-02 DIAGNOSIS — I517 Cardiomegaly: Secondary | ICD-10-CM | POA: Diagnosis not present

## 2019-07-02 DIAGNOSIS — E785 Hyperlipidemia, unspecified: Secondary | ICD-10-CM | POA: Diagnosis not present

## 2019-07-02 DIAGNOSIS — Z20822 Contact with and (suspected) exposure to covid-19: Secondary | ICD-10-CM | POA: Diagnosis not present

## 2019-07-02 DIAGNOSIS — R4182 Altered mental status, unspecified: Secondary | ICD-10-CM | POA: Diagnosis not present

## 2019-07-02 DIAGNOSIS — R0902 Hypoxemia: Secondary | ICD-10-CM | POA: Diagnosis not present

## 2019-07-02 DIAGNOSIS — R41841 Cognitive communication deficit: Secondary | ICD-10-CM | POA: Diagnosis not present

## 2019-07-02 DIAGNOSIS — M2550 Pain in unspecified joint: Secondary | ICD-10-CM | POA: Diagnosis not present

## 2019-07-02 DIAGNOSIS — R531 Weakness: Secondary | ICD-10-CM | POA: Diagnosis not present

## 2019-07-02 DIAGNOSIS — Z881 Allergy status to other antibiotic agents status: Secondary | ICD-10-CM | POA: Diagnosis not present

## 2019-07-02 DIAGNOSIS — M1990 Unspecified osteoarthritis, unspecified site: Secondary | ICD-10-CM | POA: Diagnosis not present

## 2019-07-02 DIAGNOSIS — M6259 Muscle wasting and atrophy, not elsewhere classified, multiple sites: Secondary | ICD-10-CM | POA: Diagnosis not present

## 2019-07-02 DIAGNOSIS — R05 Cough: Secondary | ICD-10-CM | POA: Diagnosis not present

## 2019-07-02 DIAGNOSIS — Z79899 Other long term (current) drug therapy: Secondary | ICD-10-CM | POA: Diagnosis not present

## 2019-07-02 LAB — GLUCOSE, CAPILLARY: Glucose-Capillary: 91 mg/dL (ref 70–99)

## 2019-07-02 MED ORDER — SUMATRIPTAN SUCCINATE 100 MG PO TABS: 100.0000 mg | ORAL_TABLET | ORAL | 0 refills | Status: DC | PRN

## 2019-07-02 MED ORDER — LORAZEPAM 1 MG PO TABS: 1.0000 mg | ORAL_TABLET | Freq: Two times a day (BID) | ORAL | 0 refills | Status: AC | PRN

## 2019-07-02 MED ORDER — LEVETIRACETAM 500 MG PO TABS: 500.0000 mg | ORAL_TABLET | Freq: Two times a day (BID) | ORAL | 0 refills | Status: DC

## 2019-07-02 NOTE — Discharge Summary (Addendum)
Physician Discharge Summary  Lindsey Rowe:[id number] DOB: 24-Jun-1945 DOA: 06/28/2019  PCP: Maurice Small, MD  Admit date: 06/28/2019 Discharge date: 07/02/2019  Admitted From: Home Disposition: Skilled nursing facility  Recommendations for Outpatient Follow-up:  1. Follow up with PCP in 1-2 weeks after discharge from skilled nursing facility.    Discharge Condition: Stable CODE STATUS: DNR Diet recommendation: Low-salt diet  Discharge summary: Lindsey Rowe is a 74 y.o. female with PMH of bipolar disorder, hypertension, hypothyroidism, osteoarthritis, impaired gait who lives alone at home, uses a walker. Patient presented to the ED on 5/23 after a fall.  Patient had a fall at home about 48 hours ago and was unable to get up. Patient states she might have had multiple falls. Eventually she was able to crawl and get in contact with the neighbors.  EMS was called and patient was brought to the ED.  In the ED, patient hemodynamically stable. X-rays were largely unremarkable but on exam patient does have right temporal ecchymosis and also ecchymosis in the right shoulder. EKG showed normal sinus rhythm with low voltage. Labs showed CK levels were mildly elevated at 340. Hemoglobin 11.7. Covid test was negative.  CT scan of the head showed subdural hematoma.Neurosurgery was consulted. Surgical intervention was not recommended.  Acute traumatic subdural hematoma  CT head showed large acute SDH measuring 14mm at maximum thickness with 7mm of shift. Neurosurgery consult appreciated.  Planning conservative management. Subsequent CT scan stable.  Repeat CT scan on 5/27 with a stable appearance. Neurosurgery recommended conservative management. Was treated with dexamethasone, she received for 3 days as per neuro plan, discontinue. Neuro recommended 7 days of Keppra, 4 more days to go. Avoid all NSAID's, antiplatelets and anticoagulation.  Outpatient neurosurgery  follow-up.  Essential hypertension -Continue lisinopril 10 mg daily from home.    Hyperlipidemia - Continue pravastatin  Hypothyroidism - Continue Synthroid  Psych disorder -Multiple meds at home including BuSpar 10 mg twice daily, Lyrica 100 mg twice daily, Cymbalta 30 mg twice daily, Zanaflex 2 mg 3 times daily as needed, Ativan 1 mg twice daily as needed. -All resumed.  Morbid obesity - Body mass index is 41.63 kg/m. Patient has been advised to make an attempt to improve diet and exercise patterns to aid in weight loss.  Patient is medically stable to transfer to skilled level of care today to continue to work with inpatient PT OT.   Discharge Diagnoses:  Principal Problem:   Subdural hematoma (HCC) Active Problems:   BIPOLAR AFFECTIVE DISORDER   HYPERTENSION, BENIGN ESSENTIAL   Hypothyroid    Discharge Instructions  Discharge Instructions    Call MD for:  extreme fatigue   Complete by: As directed    Call MD for:  persistant dizziness or light-headedness   Complete by: As directed    Call MD for:  persistant nausea and vomiting   Complete by: As directed    Diet - low sodium heart healthy   Complete by: As directed    Discharge instructions   Complete by: As directed    Avoid all NSAIDs   Increase activity slowly   Complete by: As directed      Allergies as of 07/02/2019      Reactions   Gabapentin Other (See Comments)   Right foot and leg swelled    Cefadroxil Itching   Ends of hair itched       Medication List    STOP taking these medications   meloxicam 15 MG tablet  Commonly known as: MOBIC     TAKE these medications   albuterol 108 (90 Base) MCG/ACT inhaler Commonly known as: VENTOLIN HFA Inhale 2 puffs into the lungs every 6 (six) hours as needed for wheezing or shortness of breath.   albuterol 1.25 MG/3ML nebulizer solution Commonly known as: ACCUNEB Take 3 mLs (1.25 mg total) by nebulization every 6 (six) hours as needed for up to 5  days for wheezing.   busPIRone 10 MG tablet Commonly known as: BUSPAR Take 10 mg by mouth 2 (two) times daily.   DULoxetine 30 MG capsule Commonly known as: CYMBALTA Take 1 capsule (30 mg total) by mouth 2 (two) times daily. What changed:   how much to take  when to take this  additional instructions   EYE DROPS OP Apply 1 drop to eye daily as needed (dry eyes).   fluticasone 50 MCG/ACT nasal spray Commonly known as: FLONASE Place 1 spray into both nostrils daily as needed for allergies or rhinitis.   levETIRAcetam 500 MG tablet Commonly known as: KEPPRA Take 1 tablet (500 mg total) by mouth 2 (two) times daily for 4 days.   levothyroxine 75 MCG tablet Commonly known as: SYNTHROID Take 75 mcg by mouth daily before breakfast.   lisinopril 10 MG tablet Commonly known as: ZESTRIL Take 10 mg by mouth daily.   loperamide 2 MG capsule Commonly known as: IMODIUM Take 1 capsule (2 mg total) by mouth 3 (three) times daily as needed for diarrhea or loose stools.   LORazepam 1 MG tablet Commonly known as: ATIVAN Take 1 tablet (1 mg total) by mouth 2 (two) times daily as needed for up to 5 days for anxiety.   omeprazole 20 MG capsule Commonly known as: PRILOSEC Take 20 mg by mouth 2 (two) times daily before a meal.   pravastatin 40 MG tablet Commonly known as: PRAVACHOL Take 40 mg by mouth every evening.   pregabalin 100 MG capsule Commonly known as: LYRICA Take 1 capsule (100 mg total) by mouth 2 (two) times daily.   Prolia 60 MG/ML Sosy injection Generic drug: denosumab BRING TO THE OFFICE TO BE GIVEN SUBCUTANEOUSLY AS DIRECTED FOR 6 MONTHS   Restasis 0.05 % ophthalmic emulsion Generic drug: cycloSPORINE Place 1 drop into both eyes 2 (two) times daily.   SUMAtriptan 100 MG tablet Commonly known as: IMITREX Take 1 tablet (100 mg total) by mouth every 2 (two) hours as needed for migraine or headache (maximum 3 tablets  in 24 hours). What changed:   when to  take this  reasons to take this   tiZANidine 2 MG tablet Commonly known as: ZANAFLEX Take 2 mg by mouth 3 (three) times daily as needed for muscle spasms.   vitamin B-12 1000 MCG tablet Commonly known as: CYANOCOBALAMIN Take 1,000 mcg by mouth daily.   Vitamin D (Ergocalciferol) 1.25 MG (50000 UNIT) Caps capsule Commonly known as: DRISDOL Take 50,000 Units by mouth once a week. Wednesdays       Allergies  Allergen Reactions  . Gabapentin Other (See Comments)    Right foot and leg swelled   . Cefadroxil Itching    Ends of hair itched     Consultations:  Neurosurgery   Procedures/Studies: DG Chest 2 View  Result Date: 06/28/2019 CLINICAL DATA:  Fall.  Pain. EXAM: CHEST - 2 VIEW COMPARISON:  None. FINDINGS: Healed right rib fractures are identified. There is a rounded density adjacent to the most inferior healed right rib fracture which is less  prominent compared October 2020. There is a rounded density in the left lung new since October 2020. No pneumothorax. The cardiomediastinal silhouette is normal. No other acute abnormalities. IMPRESSION: 1. The rounded ill-defined density in the left mid lung new since October 2020 may represent infiltrate or nodule/neoplasm. If the patient does not have signs of infection, recommend CT imaging. If the patient has signs of infection, recommend treatment with short-term follow-up imaging in 2 or 3 weeks. 2. Healed right rib fractures, unchanged since October 2020. No pneumothorax. 3. Mildly rounded density in the right mid lung is improved since October 2020 and was likely sequela of the previous adjacent rib fracture. Electronically Signed   By: Gerome Sam III M.D   On: 06/28/2019 18:02   DG Lumbar Spine Complete  Result Date: 06/28/2019 CLINICAL DATA:  Pain after fall EXAM: LUMBAR SPINE - COMPLETE 4+ VIEW COMPARISON:  October 08, 2013 FINDINGS: Scoliotic curvature lumbar spine persist. Multilevel degenerative disc disease and lower  lumbar facet degenerative changes. Scalloping of the superior endplate of L4 is similar since 2015. Scalloping of the inferior endplate of L3 is similar. The configuration of L2 is similar as well. No traumatic malalignment. Mild calcified atherosclerosis in the distal abdominal aorta. No other abnormalities. IMPRESSION: No acute fracture or traumatic malalignment. Calcified atherosclerosis in the distal abdominal aorta. Degenerative changes as above. Electronically Signed   By: Gerome Sam III M.D   On: 06/28/2019 18:06   DG Pelvis 1-2 Views  Result Date: 06/28/2019 CLINICAL DATA:  Pain after fall EXAM: PELVIS - 1-2 VIEW COMPARISON:  None. FINDINGS: There is no evidence of pelvic fracture or diastasis. No pelvic bone lesions are seen. IMPRESSION: Negative. Electronically Signed   By: Gerome Sam III M.D   On: 06/28/2019 18:07   DG Shoulder Right  Result Date: 06/28/2019 CLINICAL DATA:  Pain after fall EXAM: RIGHT SHOULDER - 2+ VIEW COMPARISON:  None. FINDINGS: Deformity of the right humeral head is unchanged since comparison studies and nonacute. This is likely sequela of previous trauma. No acute fracture or dislocation. Healed right rib fractures. High riding humeral head suggesting rotator cuff tear, likely nonacute. No other acute abnormalities. IMPRESSION: No acute abnormalities identified. High riding humeral head suggesting rotator cuff tear, nonacute. Deformity of the right humeral head, nonacute, likely from previous trauma. Electronically Signed   By: Gerome Sam III M.D   On: 06/28/2019 18:04   CT HEAD WO CONTRAST  Result Date: 07/02/2019 CLINICAL DATA:  Subdural hematoma follow-up EXAM: CT HEAD WITHOUT CONTRAST TECHNIQUE: Contiguous axial images were obtained from the base of the skull through the vertex without intravenous contrast. COMPARISON:  06/29/2019 FINDINGS: Brain: Holo hemispheric right subdural hematoma measures 12 mm in thickness, unchanged. At the level of the  foramina of [place] leftward midline shift measures 3 mm, unchanged. Mild mass effect on the right lateral ventricle is unchanged. There is no new site of hemorrhage. No hydrocephalus or ventricular entrapment. Vascular: No hyperdense vessel or unexpected calcification. Skull: Normal. Negative for fracture or focal lesion. Sinuses/Orbits: No acute finding. Other: None. IMPRESSION: Unchanged size of right subdural hematoma with 3 mm of leftward midline shift. Electronically Signed   By: Deatra Robinson M.D.   On: 07/02/2019 01:37   CT HEAD WO CONTRAST  Result Date: 06/29/2019 CLINICAL DATA:  Follow-up subdural hemorrhage EXAM: CT HEAD WITHOUT CONTRAST TECHNIQUE: Contiguous axial images were obtained from the base of the skull through the vertex without intravenous contrast. COMPARISON:  Yesterday FINDINGS: Brain: High-density  subdural hematoma along the right cerebral convexity that measures up to 14 mm in thickness at the lateral frontal lobe. Minimal subdural hemorrhage is seen along the falx. No progression of hemorrhage. Leftward midline shift measures 6 mm as measured on coronal reformats. No entrapment or infarct. No new site of hemorrhage seen. Vascular: Negative Skull: No acute fracture. Sinuses/Orbits: Bilateral cataract resection. IMPRESSION: Unchanged subdural hematoma on the right with up to 6 mm of midline shift. Electronically Signed   By: Marnee Spring M.D.   On: 06/29/2019 10:53   CT Head Wo Contrast  Result Date: 06/28/2019 CLINICAL DATA:  Larey Seat, headache EXAM: CT HEAD WITHOUT CONTRAST CT CERVICAL SPINE WITHOUT CONTRAST TECHNIQUE: Multidetector CT imaging of the head and cervical spine was performed following the standard protocol without intravenous contrast. Multiplanar CT image reconstructions of the cervical spine were also generated. COMPARISON:  10/07/2018 FINDINGS: CT HEAD FINDINGS Brain: There is a large right-sided subdural hematoma extending along the entire right cerebral hemisphere.  Subdural hematoma measures up to 14 mm in greatest thickness. There is mass effect upon the right cerebral hemisphere with sulcal effacement and leftward midline shift measuring 7 mm at the level of the septum pellucidum. Small subdural hematomas are seen along the posterior falx as well, measuring up to 3 mm in thickness. I do not see any acute infarct. Lateral ventricles are otherwise unremarkable. Vascular: No hyperdense vessel or unexpected calcification. Skull: Normal. Negative for fracture or focal lesion. Sinuses/Orbits: No acute finding. Other: None CT CERVICAL SPINE FINDINGS Alignment: Alignment is grossly anatomic. Skull base and vertebrae: No acute displaced cervical spine fractures. Soft tissues and spinal canal: No prevertebral fluid or swelling. No visible canal hematoma. Disc levels: There is multilevel cervical spondylosis, with disc space narrowing and osteophyte formation most pronounced at C3-4, C5-6, and C6-7. Mild facet hypertrophy at C4-5. There is mild symmetrical neural foraminal encroachment at C3-4. Upper chest: Airway is patent.  Lung apices are clear. Other: Reconstructed images demonstrate no additional findings. IMPRESSION: CT head: 1. Large right-sided subdural hematoma measuring up to 14 mm in thickness. Mass effect on the right cerebral hemisphere, with leftward midline shift measuring 7 mm. 2. Small subdural hematomas along the posterior falx measuring up to 3 mm in thickness. CT cervical spine: 1. No acute cervical spine fracture. 2. Multilevel spondylosis greatest at C3-4. These results were called by telephone at the time of interpretation on 06/28/2019 at 7:41 pm to provider [facility] , who verbally acknowledged these results. Electronically Signed   By: Sharlet Salina M.D.   On: 06/28/2019 19:40   CT Chest W Contrast  Result Date: 06/28/2019 CLINICAL DATA:  Larey Seat yesterday, intracranial hemorrhage EXAM: CT CHEST WITH CONTRAST TECHNIQUE: Multidetector CT imaging of the  chest was performed during intravenous contrast administration. CONTRAST:  75mL OMNIPAQUE IOHEXOL 300 MG/ML  SOLN COMPARISON:  11/17/2018 FINDINGS: Cardiovascular: The heart and great vessels are unremarkable without pericardial effusion. Atherosclerosis of the coronary vasculature greatest in the LAD distribution. There is stable aneurysmal dilatation of the ascending thoracic aorta, measuring up to 4.4 cm. No evidence of dissection. Mediastinum/Nodes: No enlarged mediastinal, hilar, or axillary lymph nodes. Thyroid gland, trachea, and esophagus demonstrate no significant findings. Lungs/Pleura: No airspace disease, effusion, or pneumothorax. Central airways are patent. Upper Abdomen: Stable postsurgical changes from prior gastric surgery. No acute upper abdominal findings. Musculoskeletal: There are prior healed bilateral rib fractures. No acute displaced fractures. Reconstructed images demonstrate no additional findings. IMPRESSION: 1. No acute intrathoracic process. 2. 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 Recommend annual imaging followup by CTA or MRA. This recommendation follows 2010 ACCF/AHA/AATS/ACR/ASA/SCA/SCAI/SIR/STS/SVM Guidelines for the Diagnosis and Management of Patients with Thoracic Aortic Disease. Circulation. 2010; 121: R154-M086. Aortic aneurysm NOS (ICD10-I71.9) 3. Atherosclerosis of the coronary vasculature greatest in the LAD distribution. Electronically Signed   By: Sharlet Salina M.D.   On: 06/28/2019 19:40   CT Cervical Spine Wo Contrast  Result Date: 06/28/2019 CLINICAL DATA:  Larey Seat, headache EXAM: CT HEAD WITHOUT CONTRAST CT CERVICAL SPINE WITHOUT CONTRAST TECHNIQUE: Multidetector CT imaging of the head and cervical spine was performed following the standard protocol without intravenous contrast. Multiplanar CT image reconstructions of the cervical spine were also generated. COMPARISON:  10/07/2018 FINDINGS: CT HEAD FINDINGS Brain:  There is a large right-sided subdural hematoma extending along the entire right cerebral hemisphere. Subdural hematoma measures up to 14 mm in greatest thickness. There is mass effect upon the right cerebral hemisphere with sulcal effacement and leftward midline shift measuring 7 mm at the level of the septum pellucidum. Small subdural hematomas are seen along the posterior falx as well, measuring up to 3 mm in thickness. I do not see any acute infarct. Lateral ventricles are otherwise unremarkable. Vascular: No hyperdense vessel or unexpected calcification. Skull: Normal. Negative for fracture or focal lesion. Sinuses/Orbits: No acute finding. Other: None CT CERVICAL SPINE FINDINGS Alignment: Alignment is grossly anatomic. Skull base and vertebrae: No acute displaced cervical spine fractures. Soft tissues and spinal canal: No prevertebral fluid or swelling. No visible canal hematoma. Disc levels: There is multilevel cervical spondylosis, with disc space narrowing and osteophyte formation most pronounced at C3-4, C5-6, and C6-7. Mild facet hypertrophy at C4-5. There is mild symmetrical neural foraminal encroachment at C3-4. Upper chest: Airway is patent.  Lung apices are clear. Other: Reconstructed images demonstrate no additional findings. IMPRESSION: CT head: 1. Large right-sided subdural hematoma measuring up to 14 mm in thickness. Mass effect on the right cerebral hemisphere, with leftward midline shift measuring 7 mm. 2. Small subdural hematomas along the posterior falx measuring up to 3 mm in thickness. CT cervical spine: 1. No acute cervical spine fracture. 2. Multilevel spondylosis greatest at C3-4. These results were called by telephone at the time of interpretation on 06/28/2019 at 7:41 pm to provider [facility] , who verbally acknowledged these results. Electronically Signed   By: Sharlet Salina M.D.   On: 06/28/2019 19:40   DG Chest Port 1 View  Result Date: 07/01/2019 CLINICAL DATA:  Cough EXAM:  PORTABLE CHEST 1 VIEW COMPARISON:  CT chest 06/28/2019 FINDINGS: The heart size and mediastinal contours are within normal limits. A loop recorder is identified in the projection of the left cardiac apex. Asymmetric elevation of right hemidiaphragm is again noted. Both lungs are clear. Remote rib deformities are again identified bilaterally. IMPRESSION: No acute cardiopulmonary abnormalities. Electronically Signed   By: Signa Kell M.D.   On: 07/01/2019 10:36   CUP PACEART REMOTE DEVICE CHECK  Result Date: 06/18/2019 Carelink summary report received. Battery status OK. Normal device function. No new symptom episodes, tachy episodes, brady, or pause episodes. No new AF episodes. Monthly summary reports and ROV/PRN [place]   Subjective: Seen and examined.  Slightly sleepy early morning.  Takes multiple medications as well as Ativan at night. On my evaluation, she was complaining of some back pain and wanting to get out of the bed and sit in chair.  Working with PT OT.  Alert oriented x4.  Discharge Exam: Vitals:   07/02/19 0341 07/02/19 0832  BP: (!) 161/96 (!) 164/86  Pulse: 87 77  Resp: 20 20  Temp: 97.6 F (36.4 C) 97.7 F (36.5 C)  SpO2: 100% 98%   Vitals:   07/01/19 2025 07/01/19 2351 07/02/19 0341 07/02/19 0832  BP: (!) 173/98 (!) 163/79 (!) 161/96 (!) 164/86  Pulse: 71 83 87 77  Resp: 20 20 20 20   Temp: 97.6 F (36.4 C) 97.6 F (36.4 C) 97.6 F (36.4 C) 97.7 F (36.5 C)  TempSrc: Oral Oral Oral Oral  SpO2: 96% 100% 100% 98%  Weight:      Height:        General: Pt is alert, awake, not in acute distress, chronically sick looking.  On chronic oxygen therapy on 2 L. Cardiovascular: RRR, S1/S2 +, no rubs, no gallops Respiratory: CTA bilaterally, no wheezing, no rhonchi Abdominal: Soft, NT, ND, bowel sounds + Extremities: no edema, no cyanosis Ecchymosis right side of the face and right lateral shoulder.  No fractures.    The results of significant diagnostics from  this hospitalization (including imaging, microbiology, ancillary and laboratory) are listed below for reference.     Microbiology: Recent Results (from the past 240 hour(s))  SARS Coronavirus 2 by RT PCR (hospital order, performed in [facility] hospital lab) Nasopharyngeal Nasopharyngeal Swab     Status: None   Collection Time: 06/28/19  7:36 PM   Specimen: Nasopharyngeal Swab  Result Value Ref Range Status   SARS Coronavirus 2 NEGATIVE NEGATIVE Final    Comment: (NOTE) SARS-CoV-2 target nucleic acids are NOT DETECTED. The SARS-CoV-2 RNA is generally detectable in upper and lower respiratory specimens during the acute phase of infection. The lowest concentration of SARS-CoV-2 viral copies this assay can detect is 250 copies / mL. A negative result does not preclude SARS-CoV-2 infection and should not be used as the sole basis for treatment or other patient management decisions.  A negative result may occur with improper specimen collection / handling, submission of specimen other than nasopharyngeal swab, presence of viral mutation(s) within the areas targeted by this assay, and inadequate number of viral copies (<250 copies / mL). A negative result must be combined with clinical observations, patient history, and epidemiological information. Fact Sheet for Patients:   06/30/19 Fact Sheet for Healthcare Providers: BoilerBrush.com.cy This test is not yet approved or cleared  by the https://pope.com/ FDA and has been authorized for detection and/or diagnosis of SARS-CoV-2 by FDA under an Emergency Use Authorization (EUA).  This EUA will remain in effect (meaning this test can be used) for the duration of the COVID-19 declaration under Section 564(b)(1) of the Act, 21 U.S.C. section 360bbb-3(b)(1), unless the authorization is terminated or revoked sooner. Performed at [facility], [address] [place], [place]    Surgical pcr screen     Status: None   Collection Time: 06/29/19  2:50 AM   Specimen: Nasal Mucosa; Nasal Swab  Result Value Ref Range Status   MRSA, PCR NEGATIVE NEGATIVE Final   Staphylococcus aureus NEGATIVE NEGATIVE Final    Comment: (NOTE) The Xpert SA Assay (FDA approved for NASAL specimens in patients 68 years of age and older), is one component of a comprehensive surveillance program. It is not intended to diagnose infection nor to guide or monitor treatment. Performed at [facility], [address] [place], [place]   SARS Coronavirus 2 by RT PCR (hospital order, performed in [facility]  hospital lab) Nasopharyngeal Nasopharyngeal Swab     Status: None   Collection Time: 07/01/19  4:15 PM   Specimen: Nasopharyngeal Swab  Result Value Ref Range Status   SARS Coronavirus 2 NEGATIVE NEGATIVE Final    Comment: (NOTE) SARS-CoV-2 target nucleic acids are NOT DETECTED. The SARS-CoV-2 RNA is generally detectable in upper and lower respiratory specimens during the acute phase of infection. The lowest concentration of SARS-CoV-2 viral copies this assay can detect is 250 copies / mL. A negative result does not preclude SARS-CoV-2 infection and should not be used as the sole basis for treatment or other patient management decisions.  A negative result may occur with improper specimen collection / handling, submission of specimen other than nasopharyngeal swab, presence of viral mutation(s) within the areas targeted by this assay, and inadequate number of viral copies (<250 copies / mL). A negative result must be combined with clinical observations, patient history, and epidemiological information. Fact Sheet for Patients:   StrictlyIdeas.no Fact Sheet for Healthcare Providers: BankingDealers.co.za This test is not yet approved or cleared  by the [place] FDA and has been authorized for detection and/or diagnosis  of SARS-CoV-2 by FDA under an Emergency Use Authorization (EUA).  This EUA will remain in effect (meaning this test can be used) for the duration of the COVID-19 declaration under Section 564(b)(1) of the Act, 21 U.S.C. section 360bbb-3(b)(1), unless the authorization is terminated or revoked sooner. Performed at [name], [place] [address], [place], [place] [address]      Labs: BNP (last 3 results) Recent Labs    10/07/18 1710 10/08/18 1513 11/02/18 2106  BNP 45.0 57.3 54.6   Basic Metabolic Panel: Recent Labs  Lab 06/28/19 1813 07/01/19 1049  NA 142 139  K 3.6 4.1  CL 108 105  CO2 26 26  GLUCOSE 96 117*  BUN 15 13  CREATININE 0.81 0.87  CALCIUM 8.4* 7.9*   Liver Function Tests: Recent Labs  Lab 06/28/19 1813  AST 33  ALT 18  ALKPHOS 51  BILITOT 0.6  PROT 6.0*  ALBUMIN 3.7   No results for input(s): LIPASE, AMYLASE in the last 168 hours. No results for input(s): AMMONIA in the last 168 hours. CBC: Recent Labs  Lab 06/28/19 1813  WBC 5.4  NEUTROABS 3.1  HGB 11.7*  HCT 38.2  MCV 96.2  PLT 165   Cardiac Enzymes: Recent Labs  Lab 06/28/19 1813 06/29/19 0110  CKTOTAL 340* 323*   BNP: Invalid input(s): POCBNP CBG: Recent Labs  Lab 06/30/19 1717 07/01/19 0019 07/01/19 0742 07/01/19 1602 07/02/19 0002  GLUCAP 119* 109* 109* 105* 91   D-Dimer No results for input(s): DDIMER in the last 72 hours. Hgb A1c No results for input(s): HGBA1C in the last 72 hours. Lipid Profile No results for input(s): CHOL, HDL, LDLCALC, TRIG, CHOLHDL, LDLDIRECT in the last 72 hours. Thyroid function studies No results for input(s): TSH, T4TOTAL, T3FREE, THYROIDAB in the last 72 hours.  Invalid input(s): FREET3 Anemia work up No results for input(s): VITAMINB12, FOLATE, FERRITIN, TIBC, IRON, RETICCTPCT in the last 72 hours. Urinalysis    Component Value Date/Time   COLORURINE YELLOW 06/28/2019 2224   APPEARANCEUR CLEAR 06/28/2019 2224   LABSPEC  >1.046 (H) 06/28/2019 2224   PHURINE 5.0 06/28/2019 2224   GLUCOSEU NEGATIVE 06/28/2019 2224   HGBUR NEGATIVE 06/28/2019 2224   HGBUR trace-lysed 09/23/2009 0000   BILIRUBINUR NEGATIVE 06/28/2019 [place] 06/28/2019 [place] 06/28/2019 2224  UROBILINOGEN 1.0 08/13/2012 1757   NITRITE NEGATIVE 06/28/2019 2224   LEUKOCYTESUR NEGATIVE 06/28/2019 2224   Sepsis Labs Invalid input(s): PROCALCITONIN,  WBC,  LACTICIDVEN Microbiology Recent Results (from the past 240 hour(s))  SARS Coronavirus 2 by RT PCR (hospital order, performed in [facility] hospital lab) Nasopharyngeal Nasopharyngeal Swab     Status: None   Collection Time: 06/28/19  7:36 PM   Specimen: Nasopharyngeal Swab  Result Value Ref Range Status   SARS Coronavirus 2 NEGATIVE NEGATIVE Final    Comment: (NOTE) SARS-CoV-2 target nucleic acids are NOT DETECTED. The SARS-CoV-2 RNA is generally detectable in upper and lower respiratory specimens during the acute phase of infection. The lowest concentration of SARS-CoV-2 viral copies this assay can detect is 250 copies / mL. A negative result does not preclude SARS-CoV-2 infection and should not be used as the sole basis for treatment or other patient management decisions.  A negative result may occur with improper specimen collection / handling, submission of specimen other than nasopharyngeal swab, presence of viral mutation(s) within the areas targeted by this assay, and inadequate number of viral copies (<250 copies / mL). A negative result must be combined with clinical observations, patient history, and epidemiological information. Fact Sheet for Patients:   BoilerBrush.com.cy Fact Sheet for Healthcare Providers: https://pope.com/ This test is not yet approved or cleared  by the [place] FDA and has been authorized for detection and/or diagnosis of SARS-CoV-2 by FDA under an Emergency  Use Authorization (EUA).  This EUA will remain in effect (meaning this test can be used) for the duration of the COVID-19 declaration under Section 564(b)(1) of the Act, 21 U.S.C. section 360bbb-3(b)(1), unless the authorization is terminated or revoked sooner. Performed at [facility], [address], [place], [place] [address]   Surgical pcr screen     Status: None   Collection Time: 06/29/19  2:50 AM   Specimen: Nasal Mucosa; Nasal Swab  Result Value Ref Range Status   MRSA, PCR NEGATIVE NEGATIVE Final   Staphylococcus aureus NEGATIVE NEGATIVE Final    Comment: (NOTE) The Xpert SA Assay (FDA approved for NASAL specimens in patients 58 years of age and older), is one component of a comprehensive surveillance program. It is not intended to diagnose infection nor to guide or monitor treatment. Performed at [facility], [address] [place], [place] [address]   SARS Coronavirus 2 by RT PCR (hospital order, performed in [facility] hospital lab) Nasopharyngeal Nasopharyngeal Swab     Status: None   Collection Time: 07/01/19  4:15 PM   Specimen: Nasopharyngeal Swab  Result Value Ref Range Status   SARS Coronavirus 2 NEGATIVE NEGATIVE Final    Comment: (NOTE) SARS-CoV-2 target nucleic acids are NOT DETECTED. The SARS-CoV-2 RNA is generally detectable in upper and lower respiratory specimens during the acute phase of infection. The lowest concentration of SARS-CoV-2 viral copies this assay can detect is 250 copies / mL. A negative result does not preclude SARS-CoV-2 infection and should not be used as the sole basis for treatment or other patient management decisions.  A negative result may occur with improper specimen collection / handling, submission of specimen other than nasopharyngeal swab, presence of viral mutation(s) within the areas targeted by this assay, and inadequate number of viral copies (<250 copies / mL). A negative result must be combined with  clinical observations, patient history, and epidemiological information. Fact Sheet for Patients:   BoilerBrush.com.cy Fact Sheet for Healthcare Providers: https://pope.com/ This  test is not yet approved or cleared  by the [place] and has been authorized for detection and/or diagnosis of SARS-CoV-2 by FDA under an Emergency Use Authorization (EUA).  This EUA will remain in effect (meaning this test can be used) for the duration of the COVID-19 declaration under Section 564(b)(1) of the Act, 21 U.S.C. section 360bbb-3(b)(1), unless the authorization is terminated or revoked sooner. Performed at [facility], [address], [place], [place] [address]      Time coordinating discharge:  35 minutes  SIGNED:   Dorcas Carrow, MD  [facility] 07/02/2019, 10:43 AM

## 2019-07-02 NOTE — Progress Notes (Signed)
Patient discharged to [facility].  Patient to be transported by [facility].  IV removed with the catheter intact.  Discharge instructions and prescription information given to the patient and placed in the packet for discharge.  Report called to [facility].

## 2019-07-02 NOTE — TOC Transition Note (Signed)
Transition of Care [facility]) - CM/SW Discharge Note   Patient Details  Name: MILANA SALAY MRN: [id number] Date of Birth: 1945/06/22  Transition of Care [facility]) CM/SW Contact:  Baldemar Lenis, LCSW Phone Number: 07/02/2019, 11:16 AM   Clinical Narrative:   Nurse to call report to [phone]    Final next level of care: Skilled Nursing Facility Barriers to Discharge: Barriers Resolved   Patient Goals and CMS Choice Patient states their goals for this hospitalization and ongoing recovery are:: get rehab and get back home CMS Medicare.gov Compare Post Acute Care list provided to:: Patient Choice offered to / list presented to : Patient  Discharge Placement              Patient chooses bed at: [facility] Patient to be transferred to facility by: [facility] Name of family member notified: Self Patient and family notified of of transfer: 07/02/19  Discharge Plan and Services     Post Acute Care Choice: Skilled Nursing Facility                               Social Determinants of Health (SDOH) Interventions     Readmission Risk Interventions No flowsheet data found.

## 2019-07-02 NOTE — Progress Notes (Signed)
Physical Therapy Treatment Patient Details Name: Lindsey Rowe MRN: [id number] DOB: 06/19/1945 Today's Date: 07/02/2019    History of Present Illness 74 yo female with onset of falls has sustained a R frontal SDH with 14 mm thickness, with 7 mm shift from midline.  Has no fractures on imaging of R shoulder and pelvis.  PMHx:  bipolar disorder, migraines, back pain with lumbar microdiscectomy, cataract resection, cervical spondylosis with osteophytes on C3-4, C5-6 and C 6-7, rib fractures, abd aortic atherosclerosis,    PT Comments    Patient seen for mobility progression. Pt requires increased assistance for mobility this session and slow to process. +2 assist required for functional transfer training and +2 for chair follow when gait training. Pt continues to run into objects on R side and requires assist to navigate environment. Continue to progress as tolerated with anticipated d/c to SNF for further skilled PT services.     Follow Up Recommendations  SNF     Equipment Recommendations  None recommended by PT    Recommendations for Other Services       Precautions / Restrictions Precautions Precautions: Fall Restrictions Weight Bearing Restrictions: No    Mobility  Bed Mobility Overal bed mobility: Needs Assistance Bed Mobility: Supine to Sit     Supine to sit: Mod assist     General bed mobility comments: minimal assistance from pt; cues for sequencing and hand over hand assist for use of rail   Transfers Overall transfer level: Needs assistance Equipment used: Rolling walker (2 wheeled);1 person hand held assist Transfers: Sit to/from Stand Sit to Stand: Mod assist;+2 physical assistance         General transfer comment: cues for safe hand placement; +2 assist to power up into standing   Ambulation/Gait Ambulation/Gait assistance: Min assist;Mod assist Gait Distance (Feet): 70 Feet Assistive device: Rolling walker (2 wheeled) Gait Pattern/deviations:  Step-through pattern;Decreased stride length;Wide base of support;Drifts right/left Gait velocity: decreased   General Gait Details: assistance required for balance and to manage RW; pt with tendency to drift to R side and running into objects on R side; cues for navigating environment   Stairs             Wheelchair Mobility    Modified Rankin (Stroke Patients Only)       Balance Overall balance assessment: History of Falls;Needs assistance Sitting-balance support: Feet supported;Bilateral upper extremity supported Sitting balance-Leahy Scale: Fair     Standing balance support: Bilateral upper extremity supported;During functional activity Standing balance-Leahy Scale: Poor                              Cognition Arousal/Alertness: Awake/alert Behavior During Therapy: WFL for tasks assessed/performed Overall Cognitive Status: Impaired/Different from baseline Area of Impairment: Problem solving;Safety/judgement;Following commands;Memory                     Memory: Decreased short-term memory Following Commands: Follows one step commands with increased time Safety/Judgement: Decreased awareness of safety;Decreased awareness of deficits   Problem Solving: Slow processing;Requires verbal cues;Decreased initiation        Exercises      General Comments        Pertinent Vitals/Pain Pain Assessment: Faces Faces Pain Scale: Hurts little more Pain Location: R side  Pain Descriptors / Indicators: Grimacing;Guarding Pain Intervention(s): Monitored during session;Repositioned    Home Living  Prior Function            PT Goals (current goals can now be found in the care plan section) Progress towards PT goals: Progressing toward goals    Frequency    Min 3X/week      PT Plan Current plan remains appropriate    Co-evaluation              AM-PAC PT "6 Clicks" Mobility   Outcome Measure  Help  needed turning from your back to your side while in a flat bed without using bedrails?: A Little Help needed moving from lying on your back to sitting on the side of a flat bed without using bedrails?: A Little Help needed moving to and from a bed to a chair (including a wheelchair)?: A Lot Help needed standing up from a chair using your arms (e.g., wheelchair or bedside chair)?: A Lot Help needed to walk in hospital room?: A Lot Help needed climbing 3-5 steps with a railing? : Total 6 Click Score: 13    End of Session Equipment Utilized During Treatment: Gait belt;Oxygen(2 L ) Activity Tolerance: Patient tolerated treatment well Patient left: with call bell/phone within reach;in chair;with chair alarm set Nurse Communication: Mobility status PT Visit Diagnosis: Unsteadiness on feet (R26.81);Muscle weakness (generalized) (M62.81);Difficulty in walking, not elsewhere classified (R26.2)     Time: 0175-1025 PT Time Calculation (min) (ACUTE ONLY): 31 min  Charges:  $Gait Training: 23-37 mins                     Earney Navy, PTA Acute Rehabilitation Services Pager: [phone] Office: [phone]     Darliss Cheney 07/02/2019, 3:21 PM

## 2019-07-02 NOTE — Plan of Care (Signed)
  Problem: Education: Goal: Knowledge of General Education information will improve Description Including pain rating scale, medication(s)/side effects and non-pharmacologic comfort measures Outcome: Progressing   Problem: Activity: Goal: Risk for activity intolerance will decrease Outcome: Progressing   Problem: Safety: Goal: Ability to remain free from injury will improve Outcome: Progressing   

## 2019-07-03 DIAGNOSIS — F319 Bipolar disorder, unspecified: Secondary | ICD-10-CM | POA: Diagnosis not present

## 2019-07-03 DIAGNOSIS — S065X0D Traumatic subdural hemorrhage without loss of consciousness, subsequent encounter: Secondary | ICD-10-CM | POA: Diagnosis not present

## 2019-07-03 DIAGNOSIS — E785 Hyperlipidemia, unspecified: Secondary | ICD-10-CM | POA: Diagnosis not present

## 2019-07-03 DIAGNOSIS — I10 Essential (primary) hypertension: Secondary | ICD-10-CM | POA: Diagnosis not present

## 2019-07-04 ENCOUNTER — Other Ambulatory Visit (HOSPITAL_COMMUNITY): Payer: Medicare HMO

## 2019-07-06 DIAGNOSIS — S065X0D Traumatic subdural hemorrhage without loss of consciousness, subsequent encounter: Secondary | ICD-10-CM | POA: Diagnosis not present

## 2019-07-06 DIAGNOSIS — I10 Essential (primary) hypertension: Secondary | ICD-10-CM | POA: Diagnosis not present

## 2019-07-06 DIAGNOSIS — M79605 Pain in left leg: Secondary | ICD-10-CM | POA: Diagnosis not present

## 2019-07-06 DIAGNOSIS — M79604 Pain in right leg: Secondary | ICD-10-CM | POA: Diagnosis not present

## 2019-07-08 ENCOUNTER — Ambulatory Visit: Payer: Medicare HMO | Admitting: *Deleted

## 2019-07-08 DIAGNOSIS — J449 Chronic obstructive pulmonary disease, unspecified: Secondary | ICD-10-CM | POA: Diagnosis not present

## 2019-07-08 DIAGNOSIS — F419 Anxiety disorder, unspecified: Secondary | ICD-10-CM | POA: Diagnosis not present

## 2019-07-08 DIAGNOSIS — J9611 Chronic respiratory failure with hypoxia: Secondary | ICD-10-CM | POA: Diagnosis not present

## 2019-07-08 DIAGNOSIS — S065X0D Traumatic subdural hemorrhage without loss of consciousness, subsequent encounter: Secondary | ICD-10-CM | POA: Diagnosis not present

## 2019-07-09 ENCOUNTER — Ambulatory Visit: Payer: Medicare HMO | Admitting: Pulmonary Disease

## 2019-07-12 ENCOUNTER — Emergency Department (HOSPITAL_COMMUNITY): Payer: Medicare HMO

## 2019-07-12 ENCOUNTER — Emergency Department (HOSPITAL_COMMUNITY): Payer: Medicare HMO | Admitting: Anesthesiology

## 2019-07-12 ENCOUNTER — Other Ambulatory Visit: Payer: Self-pay

## 2019-07-12 ENCOUNTER — Encounter (HOSPITAL_COMMUNITY)
Admission: EM | Disposition: A | Discharge status: Skilled Nursing Facility | Payer: Self-pay | Source: Skilled Nursing Facility | Attending: Neurological Surgery

## 2019-07-12 ENCOUNTER — Inpatient Hospital Stay (HOSPITAL_COMMUNITY)
Admission: EM | Admit: 2019-07-12 | Discharge: 2019-07-16 | DRG: 025 | Disposition: A | Discharge status: Skilled Nursing Facility | Payer: Medicare HMO | Source: Skilled Nursing Facility | Attending: Neurological Surgery | Admitting: Neurological Surgery

## 2019-07-12 DIAGNOSIS — E039 Hypothyroidism, unspecified: Secondary | ICD-10-CM | POA: Diagnosis present

## 2019-07-12 DIAGNOSIS — Z66 Do not resuscitate: Secondary | ICD-10-CM | POA: Diagnosis present

## 2019-07-12 DIAGNOSIS — F319 Bipolar disorder, unspecified: Secondary | ICD-10-CM | POA: Diagnosis present

## 2019-07-12 DIAGNOSIS — S59911A Unspecified injury of right forearm, initial encounter: Secondary | ICD-10-CM | POA: Diagnosis not present

## 2019-07-12 DIAGNOSIS — M25511 Pain in right shoulder: Secondary | ICD-10-CM | POA: Diagnosis not present

## 2019-07-12 DIAGNOSIS — M1990 Unspecified osteoarthritis, unspecified site: Secondary | ICD-10-CM | POA: Diagnosis present

## 2019-07-12 DIAGNOSIS — W19XXXA Unspecified fall, initial encounter: Secondary | ICD-10-CM | POA: Diagnosis present

## 2019-07-12 DIAGNOSIS — S065X9A Traumatic subdural hemorrhage with loss of consciousness of unspecified duration, initial encounter: Secondary | ICD-10-CM | POA: Diagnosis present

## 2019-07-12 DIAGNOSIS — R410 Disorientation, unspecified: Secondary | ICD-10-CM | POA: Diagnosis not present

## 2019-07-12 DIAGNOSIS — G9349 Other encephalopathy: Secondary | ICD-10-CM | POA: Diagnosis not present

## 2019-07-12 DIAGNOSIS — M25531 Pain in right wrist: Secondary | ICD-10-CM | POA: Diagnosis not present

## 2019-07-12 DIAGNOSIS — Z8249 Family history of ischemic heart disease and other diseases of the circulatory system: Secondary | ICD-10-CM | POA: Diagnosis not present

## 2019-07-12 DIAGNOSIS — I10 Essential (primary) hypertension: Secondary | ICD-10-CM | POA: Diagnosis present

## 2019-07-12 DIAGNOSIS — Z825 Family history of asthma and other chronic lower respiratory diseases: Secondary | ICD-10-CM

## 2019-07-12 DIAGNOSIS — R279 Unspecified lack of coordination: Secondary | ICD-10-CM | POA: Diagnosis not present

## 2019-07-12 DIAGNOSIS — Z888 Allergy status to other drugs, medicaments and biological substances status: Secondary | ICD-10-CM

## 2019-07-12 DIAGNOSIS — R0989 Other specified symptoms and signs involving the circulatory and respiratory systems: Secondary | ICD-10-CM | POA: Diagnosis not present

## 2019-07-12 DIAGNOSIS — R1311 Dysphagia, oral phase: Secondary | ICD-10-CM | POA: Diagnosis not present

## 2019-07-12 DIAGNOSIS — R519 Headache, unspecified: Secondary | ICD-10-CM | POA: Diagnosis not present

## 2019-07-12 DIAGNOSIS — S065X0A Traumatic subdural hemorrhage without loss of consciousness, initial encounter: Secondary | ICD-10-CM | POA: Diagnosis not present

## 2019-07-12 DIAGNOSIS — Z79899 Other long term (current) drug therapy: Secondary | ICD-10-CM | POA: Diagnosis not present

## 2019-07-12 DIAGNOSIS — Z20822 Contact with and (suspected) exposure to covid-19: Secondary | ICD-10-CM | POA: Diagnosis present

## 2019-07-12 DIAGNOSIS — Z743 Need for continuous supervision: Secondary | ICD-10-CM | POA: Diagnosis not present

## 2019-07-12 DIAGNOSIS — G44209 Tension-type headache, unspecified, not intractable: Secondary | ICD-10-CM | POA: Diagnosis present

## 2019-07-12 DIAGNOSIS — G43909 Migraine, unspecified, not intractable, without status migrainosus: Secondary | ICD-10-CM | POA: Diagnosis present

## 2019-07-12 DIAGNOSIS — J810 Acute pulmonary edema: Secondary | ICD-10-CM | POA: Diagnosis not present

## 2019-07-12 DIAGNOSIS — D72829 Elevated white blood cell count, unspecified: Secondary | ICD-10-CM | POA: Diagnosis not present

## 2019-07-12 DIAGNOSIS — Z881 Allergy status to other antibiotic agents status: Secondary | ICD-10-CM | POA: Diagnosis not present

## 2019-07-12 DIAGNOSIS — I517 Cardiomegaly: Secondary | ICD-10-CM | POA: Diagnosis not present

## 2019-07-12 DIAGNOSIS — K219 Gastro-esophageal reflux disease without esophagitis: Secondary | ICD-10-CM | POA: Diagnosis present

## 2019-07-12 DIAGNOSIS — M6281 Muscle weakness (generalized): Secondary | ICD-10-CM | POA: Diagnosis not present

## 2019-07-12 DIAGNOSIS — E785 Hyperlipidemia, unspecified: Secondary | ICD-10-CM | POA: Diagnosis present

## 2019-07-12 DIAGNOSIS — S2231XA Fracture of one rib, right side, initial encounter for closed fracture: Secondary | ICD-10-CM | POA: Diagnosis not present

## 2019-07-12 DIAGNOSIS — G9340 Encephalopathy, unspecified: Secondary | ICD-10-CM | POA: Diagnosis not present

## 2019-07-12 DIAGNOSIS — Z79890 Hormone replacement therapy (postmenopausal): Secondary | ICD-10-CM

## 2019-07-12 DIAGNOSIS — J181 Lobar pneumonia, unspecified organism: Secondary | ICD-10-CM | POA: Diagnosis not present

## 2019-07-12 DIAGNOSIS — Z9884 Bariatric surgery status: Secondary | ICD-10-CM

## 2019-07-12 DIAGNOSIS — R404 Transient alteration of awareness: Secondary | ICD-10-CM | POA: Diagnosis not present

## 2019-07-12 DIAGNOSIS — S065X0D Traumatic subdural hemorrhage without loss of consciousness, subsequent encounter: Secondary | ICD-10-CM | POA: Diagnosis not present

## 2019-07-12 DIAGNOSIS — M79621 Pain in right upper arm: Secondary | ICD-10-CM | POA: Diagnosis not present

## 2019-07-12 DIAGNOSIS — R52 Pain, unspecified: Secondary | ICD-10-CM

## 2019-07-12 DIAGNOSIS — S6991XA Unspecified injury of right wrist, hand and finger(s), initial encounter: Secondary | ICD-10-CM | POA: Diagnosis not present

## 2019-07-12 DIAGNOSIS — R41841 Cognitive communication deficit: Secondary | ICD-10-CM | POA: Diagnosis not present

## 2019-07-12 DIAGNOSIS — S4991XA Unspecified injury of right shoulder and upper arm, initial encounter: Secondary | ICD-10-CM | POA: Diagnosis not present

## 2019-07-12 DIAGNOSIS — R5381 Other malaise: Secondary | ICD-10-CM | POA: Diagnosis not present

## 2019-07-12 DIAGNOSIS — G935 Compression of brain: Secondary | ICD-10-CM | POA: Diagnosis present

## 2019-07-12 DIAGNOSIS — R1319 Other dysphagia: Secondary | ICD-10-CM | POA: Diagnosis not present

## 2019-07-12 DIAGNOSIS — J449 Chronic obstructive pulmonary disease, unspecified: Secondary | ICD-10-CM | POA: Diagnosis present

## 2019-07-12 DIAGNOSIS — Z741 Need for assistance with personal care: Secondary | ICD-10-CM | POA: Diagnosis not present

## 2019-07-12 DIAGNOSIS — J441 Chronic obstructive pulmonary disease with (acute) exacerbation: Secondary | ICD-10-CM | POA: Diagnosis not present

## 2019-07-12 DIAGNOSIS — J939 Pneumothorax, unspecified: Secondary | ICD-10-CM | POA: Diagnosis not present

## 2019-07-12 DIAGNOSIS — R4182 Altered mental status, unspecified: Secondary | ICD-10-CM | POA: Diagnosis not present

## 2019-07-12 DIAGNOSIS — I6200 Nontraumatic subdural hemorrhage, unspecified: Secondary | ICD-10-CM | POA: Diagnosis not present

## 2019-07-12 DIAGNOSIS — R05 Cough: Secondary | ICD-10-CM | POA: Diagnosis not present

## 2019-07-12 DIAGNOSIS — J811 Chronic pulmonary edema: Secondary | ICD-10-CM | POA: Diagnosis not present

## 2019-07-12 DIAGNOSIS — S065XAA Traumatic subdural hemorrhage with loss of consciousness status unknown, initial encounter: Secondary | ICD-10-CM | POA: Diagnosis present

## 2019-07-12 DIAGNOSIS — M6259 Muscle wasting and atrophy, not elsewhere classified, multiple sites: Secondary | ICD-10-CM | POA: Diagnosis not present

## 2019-07-12 HISTORY — PX: CRANIOTOMY: SHX93

## 2019-07-12 LAB — URINALYSIS, ROUTINE W REFLEX MICROSCOPIC
Bilirubin Urine: NEGATIVE
Glucose, UA: NEGATIVE mg/dL
Hgb urine dipstick: NEGATIVE
Ketones, ur: NEGATIVE mg/dL
Leukocytes,Ua: NEGATIVE
Nitrite: NEGATIVE
Protein, ur: NEGATIVE mg/dL
Specific Gravity, Urine: 1.025 (ref 1.005–1.030)
pH: 5 (ref 5.0–8.0)

## 2019-07-12 LAB — CBC WITH DIFFERENTIAL/PLATELET
Abs Immature Granulocytes: 0.16 10*3/uL — ABNORMAL HIGH (ref 0.00–0.07)
Basophils Absolute: 0 10*3/uL (ref 0.0–0.1)
Basophils Relative: 0 %
Eosinophils Absolute: 0 10*3/uL (ref 0.0–0.5)
Eosinophils Relative: 0 %
HCT: 44 % (ref 36.0–46.0)
Hemoglobin: 13.6 g/dL (ref 12.0–15.0)
Immature Granulocytes: 1 %
Lymphocytes Relative: 9 %
Lymphs Abs: 1.8 10*3/uL (ref 0.7–4.0)
MCH: 29.7 pg (ref 26.0–34.0)
MCHC: 30.9 g/dL (ref 30.0–36.0)
MCV: 96.1 fL (ref 80.0–100.0)
Monocytes Absolute: 1.9 10*3/uL — ABNORMAL HIGH (ref 0.1–1.0)
Monocytes Relative: 9 %
Neutro Abs: 16.8 10*3/uL — ABNORMAL HIGH (ref 1.7–7.7)
Neutrophils Relative %: 81 %
Platelets: 320 10*3/uL (ref 150–400)
RBC: 4.58 MIL/uL (ref 3.87–5.11)
RDW: 14.7 % (ref 11.5–15.5)
WBC: 20.6 10*3/uL — ABNORMAL HIGH (ref 4.0–10.5)
nRBC: 0 % (ref 0.0–0.2)

## 2019-07-12 LAB — I-STAT VENOUS BLOOD GAS, ED
Acid-Base Excess: 4 mmol/L — ABNORMAL HIGH (ref 0.0–2.0)
Bicarbonate: 28 mmol/L (ref 20.0–28.0)
Calcium, Ion: 1.07 mmol/L — ABNORMAL LOW (ref 1.15–1.40)
HCT: 42 % (ref 36.0–46.0)
Hemoglobin: 14.3 g/dL (ref 12.0–15.0)
O2 Saturation: 81 %
Potassium: 4.5 mmol/L (ref 3.5–5.1)
Sodium: 137 mmol/L (ref 135–145)
TCO2: 29 mmol/L (ref 22–32)
pCO2, Ven: 40.8 mmHg — ABNORMAL LOW (ref 44.0–60.0)
pH, Ven: 7.444 — ABNORMAL HIGH (ref 7.250–7.430)
pO2, Ven: 43 mmHg (ref 32.0–45.0)

## 2019-07-12 LAB — COMPREHENSIVE METABOLIC PANEL
ALT: 13 U/L (ref 0–44)
AST: 20 U/L (ref 15–41)
Albumin: 3.4 g/dL — ABNORMAL LOW (ref 3.5–5.0)
Alkaline Phosphatase: 59 U/L (ref 38–126)
Anion gap: 13 (ref 5–15)
BUN: 25 mg/dL — ABNORMAL HIGH (ref 8–23)
CO2: 26 mmol/L (ref 22–32)
Calcium: 8.9 mg/dL (ref 8.9–10.3)
Chloride: 98 mmol/L (ref 98–111)
Creatinine, Ser: 0.88 mg/dL (ref 0.44–1.00)
GFR calc Af Amer: >60 mL/min (ref >60)
GFR calc non Af Amer: >60 mL/min (ref >60)
Glucose, Bld: 139 mg/dL — ABNORMAL HIGH (ref 70–99)
Potassium: 4.9 mmol/L (ref 3.5–5.1)
Sodium: 137 mmol/L (ref 135–145)
Total Bilirubin: 0.9 mg/dL (ref 0.3–1.2)
Total Protein: 6.4 g/dL — ABNORMAL LOW (ref 6.5–8.1)

## 2019-07-12 LAB — AMMONIA: Ammonia: 16 umol/L (ref 9–35)

## 2019-07-12 LAB — PROTIME-INR
INR: 1 (ref 0.8–1.2)
Prothrombin Time: 12.4 seconds (ref 11.4–15.2)

## 2019-07-12 LAB — SARS CORONAVIRUS 2 BY RT PCR (HOSPITAL ORDER, PERFORMED IN ~~LOC~~ HOSPITAL LAB): SARS Coronavirus 2: NEGATIVE

## 2019-07-12 LAB — TYPE AND SCREEN
ABO/RH(D): O NEG
Antibody Screen: NEGATIVE

## 2019-07-12 LAB — CBG MONITORING, ED: Glucose-Capillary: 140 mg/dL — ABNORMAL HIGH (ref 70–99)

## 2019-07-12 LAB — ETHANOL: Alcohol, Ethyl (B): <10 mg/dL (ref <10)

## 2019-07-12 SURGERY — CRANIOTOMY HEMATOMA EVACUATION SUBDURAL
Anesthesia: General | Site: Head | Laterality: Right

## 2019-07-12 MED ORDER — FENTANYL CITRATE (PF) 250 MCG/5ML IJ SOLN
INTRAMUSCULAR | Status: AC
  Filled 2019-07-12: qty 5

## 2019-07-12 MED ORDER — LACTATED RINGERS IV SOLN: INTRAVENOUS | Status: DC | PRN

## 2019-07-12 MED ORDER — SODIUM CHLORIDE 0.9 % IV SOLN
INTRAVENOUS | Status: DC | PRN
  Administered 2019-07-12: 500 mL

## 2019-07-12 MED ORDER — BACITRACIN ZINC 500 UNIT/GM EX OINT
TOPICAL_OINTMENT | CUTANEOUS | Status: DC | PRN
  Administered 2019-07-12: 1 via TOPICAL

## 2019-07-12 MED ORDER — LIDOCAINE HCL (CARDIAC) PF 100 MG/5ML IV SOSY
PREFILLED_SYRINGE | INTRAVENOUS | Status: DC | PRN
  Administered 2019-07-12: 60 mg via INTRAVENOUS

## 2019-07-12 MED ORDER — PROPOFOL 10 MG/ML IV BOLUS
INTRAVENOUS | Status: DC | PRN
  Administered 2019-07-12: 100 mg via INTRAVENOUS

## 2019-07-12 MED ORDER — THROMBIN 20000 UNITS EX SOLR
CUTANEOUS | Status: DC | PRN
  Administered 2019-07-12: 20 mL via TOPICAL

## 2019-07-12 MED ORDER — PROPOFOL 10 MG/ML IV BOLUS
INTRAVENOUS | Status: AC
  Filled 2019-07-12: qty 20

## 2019-07-12 MED ORDER — THROMBIN 20000 UNITS EX SOLR
CUTANEOUS | Status: AC
  Filled 2019-07-12: qty 20000

## 2019-07-12 MED ORDER — FENTANYL CITRATE (PF) 100 MCG/2ML IJ SOLN
INTRAMUSCULAR | Status: DC | PRN
  Administered 2019-07-12: 50 ug via INTRAVENOUS

## 2019-07-12 MED ORDER — THROMBIN 5000 UNITS EX SOLR
CUTANEOUS | Status: AC
  Filled 2019-07-12: qty 5000

## 2019-07-12 MED ORDER — PHENYLEPHRINE HCL-NACL 10-0.9 MG/250ML-% IV SOLN
INTRAVENOUS | Status: DC | PRN
  Administered 2019-07-12: 50 ug/min via INTRAVENOUS

## 2019-07-12 MED ORDER — CEFAZOLIN SODIUM 1 G IJ SOLR
INTRAMUSCULAR | Status: DC | PRN
  Administered 2019-07-12: 2 g via INTRAMUSCULAR

## 2019-07-12 MED ORDER — LIDOCAINE-EPINEPHRINE 1 %-1:100000 IJ SOLN
INTRAMUSCULAR | Status: DC | PRN
  Administered 2019-07-12: 7 mL via INTRADERMAL

## 2019-07-12 MED ORDER — 0.9 % SODIUM CHLORIDE (POUR BTL) OPTIME
TOPICAL | Status: DC | PRN
  Administered 2019-07-12 (×2): 1000 mL

## 2019-07-12 MED ORDER — ROCURONIUM BROMIDE 100 MG/10ML IV SOLN
INTRAVENOUS | Status: DC | PRN
  Administered 2019-07-12: 60 mg via INTRAVENOUS

## 2019-07-12 MED ORDER — THROMBIN 5000 UNITS EX SOLR
OROMUCOSAL | Status: DC | PRN
  Administered 2019-07-12: 5 mL via TOPICAL

## 2019-07-12 MED ORDER — ONDANSETRON HCL 4 MG/2ML IJ SOLN
INTRAMUSCULAR | Status: DC | PRN
  Administered 2019-07-12: 4 mg via INTRAVENOUS

## 2019-07-12 MED ORDER — BACITRACIN ZINC 500 UNIT/GM EX OINT
TOPICAL_OINTMENT | CUTANEOUS | Status: AC
  Filled 2019-07-12: qty 28.35

## 2019-07-12 MED ORDER — SUGAMMADEX SODIUM 200 MG/2ML IV SOLN
INTRAVENOUS | Status: DC | PRN
  Administered 2019-07-12: 200 mg via INTRAVENOUS

## 2019-07-12 MED ORDER — DEXAMETHASONE SODIUM PHOSPHATE 4 MG/ML IJ SOLN
INTRAMUSCULAR | Status: DC | PRN
  Administered 2019-07-12: 4 mg via INTRAVENOUS

## 2019-07-12 MED ORDER — LIDOCAINE-EPINEPHRINE 1 %-1:100000 IJ SOLN
INTRAMUSCULAR | Status: AC
  Filled 2019-07-12: qty 1

## 2019-07-12 MED ORDER — PHENYLEPHRINE 40 MCG/ML (10ML) SYRINGE FOR IV PUSH (FOR BLOOD PRESSURE SUPPORT)
PREFILLED_SYRINGE | INTRAVENOUS | Status: DC | PRN
  Administered 2019-07-12: 40 ug via INTRAVENOUS

## 2019-07-12 SURGICAL SUPPLY — 64 items
BLADE CLIPPER SURG (BLADE) ×2 IMPLANT
BNDG GAUZE ELAST 4 BULKY (GAUZE/BANDAGES/DRESSINGS) IMPLANT
BUR ACORN 9.0 PRECISION (BURR) ×2 IMPLANT
BUR MATCHSTICK NEURO 3.0 LAGG (BURR) IMPLANT
BUR SPIRAL ROUTER 2.3 (BUR) ×2 IMPLANT
CANISTER SUCT 3000ML PPV (MISCELLANEOUS) ×2 IMPLANT
CLIP VESOCCLUDE MED 6/CT (CLIP) IMPLANT
COVER WAND RF STERILE (DRAPES) ×2 IMPLANT
DRAPE NEUROLOGICAL W/INCISE (DRAPES) ×2 IMPLANT
DRAPE SHEET LG 3/4 BI-LAMINATE (DRAPES) ×2 IMPLANT
DRAPE SURG 17X23 STRL (DRAPES) IMPLANT
DRAPE WARM FLUID 44X44 (DRAPES) ×2 IMPLANT
DURAPREP 6ML APPLICATOR 50/CS (WOUND CARE) ×2 IMPLANT
ELECT REM PT RETURN 9FT ADLT (ELECTROSURGICAL) ×2
ELECTRODE REM PT RTRN 9FT ADLT (ELECTROSURGICAL) ×1 IMPLANT
EVACUATOR 1/8 PVC DRAIN (DRAIN) IMPLANT
EVACUATOR SILICONE 100CC (DRAIN) IMPLANT
GAUZE 4X4 16PLY RFD (DISPOSABLE) IMPLANT
GAUZE SPONGE 4X4 12PLY STRL (GAUZE/BANDAGES/DRESSINGS) ×2 IMPLANT
GLOVE BIO SURGEON STRL SZ 6.5 (GLOVE) ×2 IMPLANT
GLOVE BIO SURGEON STRL SZ7 (GLOVE) ×1 IMPLANT
GLOVE BIO SURGEON STRL SZ7.5 (GLOVE) ×3 IMPLANT
GLOVE BIOGEL PI IND STRL 7.5 (GLOVE) ×2 IMPLANT
GLOVE BIOGEL PI INDICATOR 7.5 (GLOVE) ×1
GLOVE EXAM NITRILE LRG STRL (GLOVE) ×2 IMPLANT
GLOVE EXAM NITRILE XL STR (GLOVE) IMPLANT
GLOVE EXAM NITRILE XS STR PU (GLOVE) IMPLANT
GOWN STRL REUS W/ TWL LRG LVL3 (GOWN DISPOSABLE) ×2 IMPLANT
GOWN STRL REUS W/ TWL XL LVL3 (GOWN DISPOSABLE) IMPLANT
GOWN STRL REUS W/TWL 2XL LVL3 (GOWN DISPOSABLE) IMPLANT
GOWN STRL REUS W/TWL LRG LVL3 (GOWN DISPOSABLE) ×4
GOWN STRL REUS W/TWL XL LVL3 (GOWN DISPOSABLE)
HEMOSTAT POWDER KIT SURGIFOAM (HEMOSTASIS) ×2 IMPLANT
HEMOSTAT SURGICEL 2X14 (HEMOSTASIS) IMPLANT
HOOK DURA 1/2IN (MISCELLANEOUS) ×2 IMPLANT
KIT BASIN OR (CUSTOM PROCEDURE TRAY) ×2 IMPLANT
KIT TURNOVER KIT B (KITS) ×2 IMPLANT
NEEDLE HYPO 22GX1.5 SAFETY (NEEDLE) ×2 IMPLANT
NS IRRIG 1000ML POUR BTL (IV SOLUTION) ×2 IMPLANT
PACK CRANIOTOMY CUSTOM (CUSTOM PROCEDURE TRAY) ×2 IMPLANT
PATTIES SURGICAL .5 X.5 (GAUZE/BANDAGES/DRESSINGS) IMPLANT
PATTIES SURGICAL .5 X3 (DISPOSABLE) IMPLANT
PATTIES SURGICAL 1X1 (DISPOSABLE) IMPLANT
SPONGE NEURO XRAY DETECT 1X3 (DISPOSABLE) IMPLANT
SPONGE SURGIFOAM ABS GEL 100 (HEMOSTASIS) ×2 IMPLANT
STAPLER VISISTAT 35W (STAPLE) ×2 IMPLANT
STOCKINETTE 6  STRL (DRAPES) ×2
STOCKINETTE 6 STRL (DRAPES) ×1 IMPLANT
SUT ETHILON 3 0 FSL (SUTURE) IMPLANT
SUT ETHILON 3 0 PS 1 (SUTURE) IMPLANT
SUT MNCRL AB 3-0 PS2 27 (SUTURE) ×1 IMPLANT
SUT NURALON 4 0 TR CR/8 (SUTURE) ×4 IMPLANT
SUT STEEL 0 (SUTURE)
SUT STEEL 0 18XMFL TIE 17 (SUTURE) IMPLANT
SUT VIC AB 0 CT1 18XCR BRD8 (SUTURE) ×2 IMPLANT
SUT VIC AB 0 CT1 8-18 (SUTURE)
SUT VIC AB 2-0 CP2 18 (SUTURE) ×1 IMPLANT
SUT VIC AB 3-0 SH 8-18 (SUTURE) ×2 IMPLANT
TOWEL GREEN STERILE (TOWEL DISPOSABLE) ×2 IMPLANT
TOWEL GREEN STERILE FF (TOWEL DISPOSABLE) ×2 IMPLANT
TRAY FOLEY MTR SLVR 16FR STAT (SET/KITS/TRAYS/PACK) ×2 IMPLANT
TUBE CONNECTING 12X1/4 (SUCTIONS) ×2 IMPLANT
UNDERPAD 30X36 HEAVY ABSORB (UNDERPADS AND DIAPERS) ×2 IMPLANT
WATER STERILE IRR 1000ML POUR (IV SOLUTION) ×2 IMPLANT

## 2019-07-12 NOTE — ED Notes (Signed)
phlebotomy requested for labs

## 2019-07-12 NOTE — Brief Op Note (Signed)
07/12/2019  11:01 PM  PATIENT:  [name]  74 y.o. female  PRE-OPERATIVE DIAGNOSIS:  Subdural Hematoma  POST-OPERATIVE DIAGNOSIS:  Subdural Hematoma  PROCEDURE:  Procedure(s): Guss Bunde for HEMATOMA EVACUATION SUBDURAL (Right)  SURGEON:  Surgeon(s) and Role:    * Jadene Pierini, MD - Primary  PHYSICIAN ASSISTANT:   ASSISTANTS: none   ANESTHESIA:   general  EBL:  30 mL   BLOOD ADMINISTERED:none  DRAINS: none   LOCAL MEDICATIONS USED:  LIDOCAINE   SPECIMEN:  No Specimen  DISPOSITION OF SPECIMEN:  N/A  COUNTS:  YES  TOURNIQUET:  * No tourniquets in log *  DICTATION: .Note written in EPIC  PLAN OF CARE: Admit to inpatient   PATIENT DISPOSITION:  PACU - hemodynamically stable.   Delay start of Pharmacological VTE agent (>24hrs) due to surgical blood loss or risk of bleeding: yes

## 2019-07-12 NOTE — Op Note (Signed)
PATIENT: Lindsey Rowe  DAY OF SURGERY: 07/12/19   PRE-OPERATIVE DIAGNOSIS:  Right subdural hematoma   POST-OPERATIVE DIAGNOSIS:  Right subdural hematoma   PROCEDURE:  Right burr hole evacuation of subdural hematoma   SURGEON:  Surgeon(s) and Role:    Jadene Pierini, MD - Primary   ANESTHESIA: ETGA   BRIEF HISTORY: This is a 73 year old woman w/ a prior aSDH who presented with obtundation, CTH confirmed expansion of the subdural with midline shift. Given the patient's progressive obtundation, I recommended surgical evacuation to prevent continued progression of neurologic deficits. No family was available, therefore performed as an emergency.   OPERATIVE DETAIL: The patient was taken to the operating room and placed on the OR table in the supine position. A formal time out was performed with two patient identifiers and confirmed the operative site. Anesthesia was induced by the anesthesia team. The operative site was marked, hair was clipped with surgical clippers, the area was then prepped and draped in a sterile fashion. A linear incision was placed in the right frontal region. Soft tissues were dissected, retractor placed, and a burr hole was created with a high speed drill. The dura was incised and coagulated with outflow of dark subacute blood products under high pressure. The subdural space was copiously irrigated until it returned clear. The brain came up quickly and there was not room to safely place a drain in the subdural spaceThe wound was copiously irrigated as well. Hemostasis was confirmed.  All instrument and sponge counts were correct, the incision was then closed in layers. The patient was then returned to anesthesia for emergence. No apparent complications at the completion of the procedure.   EBL:  33mL   DRAINS: none   SPECIMENS: none   Jadene Pierini, MD 07/12/19 10:10 PM

## 2019-07-12 NOTE — ED Notes (Signed)
Pt to be transported to Short stay 36 Report given

## 2019-07-12 NOTE — ED Provider Notes (Signed)
[facility]   CSN: [id number] Arrival date & time: 07/12/19  [id number]     History Chief Complaint  Patient presents with  . Altered Mental Status    Lindsey Rowe is a 74 y.o. female.  Patient presents from [facility] nursing facility with EMS for worsening mental status and somnolence.  Gradually worsening throughout today.  No reported fevers or new head injuries.  No one with patient at the bedside.  Patient recently in the hospital for subdural hemorrhage.        Past Medical History:  Diagnosis Date  . Arthritis   . Back pain   . Bipolar 1 disorder (HCC)   . Complication of anesthesia    hard to wake up anesthesia  . Depression   . GERD (gastroesophageal reflux disease)   . Headache(784.0)    migraines and tension headaches  . Hyperlipidemia   . Hypertension   . Hypothyroidism   . Thyroid disease     Patient Active Problem List   Diagnosis Date Noted  . Subdural hematoma (HCC) 06/28/2019  . COPD exacerbation (HCC) 10/08/2018  . Asthmatic bronchitis 12/02/2017  . Acute respiratory failure with hypoxia (HCC) 06/27/2017  . Diastolic dysfunction 06/27/2017  . Acute lower UTI 06/27/2017  . Acute metabolic encephalopathy 06/27/2017  . Altered mental state 06/10/2017  . Dyspnea and respiratory abnormalities 08/29/2016  . Hypoxemia 08/29/2016  . Shoulder dislocation, right, initial encounter 05/24/2016  . 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 Hyperglycemia 05/24/2016  . Anterior dislocation of right shoulder   . Syncope 03/24/2016  . Generalized weakness 09/24/2015  . Leg pain, bilateral 09/24/2015  . Depression 09/24/2015  . Loss of appetite 09/24/2015  . Leg weakness, bilateral   . Hypothyroid 12/28/2012  . Hypovolemia 12/28/2012  . Hypotension 08/13/2012  . Dehydration 08/13/2012  . AKI (acute kidney injury) (HCC) 08/13/2012  . Hypoventilation associated with obesity syndrome (HCC) 08/13/2012    . Acute respiratory failure (HCC) 07/26/2012  . Chest pain 07/26/2012  . ANKLE SPRAIN, RIGHT 03/16/2010  . DIVERTICULOSIS, COLON 10/31/2009  . SKIN LESION 09/23/2009  . Shoulder pain, right 07/05/2009  . COUGH 03/29/2009  . Migraine 03/11/2009  . OTITIS MEDIA, RIGHT 12/13/2008  . BRUISE 12/06/2008  . ACID REFLUX DISEASE 09/13/2008  . ALLERGIC RHINITIS 05/20/2008  . UNSPECIFIED DISEASE OF HAIR AND HAIR FOLLICLES 05/20/2008  . DERMATITIS 11/12/2007  . OBESITY 07/18/2007  . HYPERCHOLESTEROLEMIA 05/13/2007  . BIPOLAR AFFECTIVE DISORDER 05/12/2007  . HYPERTENSION, BENIGN ESSENTIAL 05/12/2007  . Low back pain potentially associated with radiculopathy 05/12/2007    Past Surgical History:  Procedure Laterality Date  . APPENDECTOMY    . BACK SURGERY    . EYE SURGERY Bilateral    lens implant  . GASTRIC BYPASS    . LOOP RECORDER INSERTION N/A 05/25/2016   Procedure: Loop Recorder Insertion;  Surgeon: Will Jorja LoaMartin Camnitz, MD;  Location: [facility] INVASIVE CV LAB;  Service: Cardiovascular;  Laterality: N/A;  . LUMBAR LAMINECTOMY/DECOMPRESSION MICRODISCECTOMY Right 07/23/2012   Procedure: LUMBAR LAMINECTOMY/DECOMPRESSION MICRODISCECTOMY 2 LEVELS;  Surgeon: Karn CassisErnesto M Botero, MD;  Location: [facility] NEURO ORS;  Service: Neurosurgery;  Laterality: Right;  Right Lumbar three-four  lumbar four-five Laminectomy/Foraminotomy  . NASAL SINUS SURGERY Bilateral   . TONSILLECTOMY    . WRIST SURGERY       OB History   No obstetric history on file.     Family History  Problem Relation Age of Onset  . Heart disease Mother   .  Emphysema Father   . Diabetes Sister   . Other Brother        Brain tumor - s/p excision    Social History   Tobacco Use  . Smoking status: Never Smoker  . Smokeless tobacco: Never Used  Substance Use Topics  . Alcohol use: Yes    Comment: social  . Drug use: No    Comment: Pt + opioids as prescribed    Home Medications Prior to Admission medications   Medication Sig  Start Date End Date Taking? Authorizing Provider  acetaminophen (TYLENOL) 325 MG tablet Take 650 mg by mouth every 8 (eight) hours.   Yes [provider]  albuterol (PROVENTIL HFA;VENTOLIN HFA) 108 (90 Base) MCG/ACT inhaler Inhale 2 puffs into the lungs every 6 (six) hours as needed for wheezing or shortness of breath. 04/07/18  Yes Charlynne Pander, MD  busPIRone (BUSPAR) 10 MG tablet Take 10 mg by mouth 2 (two) times daily. 05/01/19  Yes [provider]  cycloSPORINE (RESTASIS) 0.05 % ophthalmic emulsion Place 1 drop into both eyes 2 (two) times daily.   Yes [provider]  DULoxetine (CYMBALTA) 30 MG capsule Take 1 capsule (30 mg total) by mouth 2 (two) times daily. 05/26/16  Yes Pearson Grippe, MD  fluticasone [facility]) 50 MCG/ACT nasal spray Place 1 spray into both nostrils daily as needed for allergies or rhinitis. 06/29/17  Yes Rai, Ripudeep K, MD  levothyroxine (SYNTHROID, LEVOTHROID) 75 MCG tablet Take 75 mcg by mouth daily before breakfast.  09/06/15  Yes [provider]  lisinopril (PRINIVIL,ZESTRIL) 10 MG tablet Take 10 mg by mouth daily.  08/15/16  Yes [provider]  loperamide (IMODIUM) 2 MG capsule Take 1 capsule (2 mg total) by mouth 3 (three) times daily as needed for diarrhea or loose stools. 07/02/17  Yes Rai, Ripudeep K, MD  LORazepam (ATIVAN) 0.5 MG tablet Take 0.5 mg by mouth 2 (two) times daily as needed for anxiety.   Yes [provider]  Menthol, Topical Analgesic, (BIOFREEZE) 4 % GEL Apply 1 application topically 2 (two) times daily. Apply to both legs   Yes [provider]  omeprazole (PRILOSEC) 20 MG capsule Take 20 mg by mouth 2 (two) times daily before a meal.   Yes [provider]  pravastatin (PRAVACHOL) 40 MG tablet Take 40 mg by mouth every evening.    Yes [provider]  PROLIA 60 MG/ML SOSY injection Inject 60 mg into the skin See admin instructions. BRING TO THE OFFICE TO BE GIVEN  SUBCUTANEOUSLY AS DIRECTED FOR 6 MONTHS 02/26/19  Yes [provider]  tiZANidine (ZANAFLEX) 2 MG tablet Take 2 mg by mouth 3 (three) times daily as needed for muscle spasms.  06/24/19  Yes [provider]  vitamin B-12 (CYANOCOBALAMIN) 1000 MCG tablet Take 1,000 mcg by mouth daily.   Yes [provider]  Vitamin D, Ergocalciferol, (DRISDOL) 1.25 MG (50000 UT) CAPS capsule Take 50,000 Units by mouth every Wednesday.  03/03/18  Yes [provider]  albuterol (ACCUNEB) 1.25 MG/3ML nebulizer solution Take 3 mLs (1.25 mg total) by nebulization every 6 (six) hours as needed for up to 5 days for wheezing. Patient not taking: Reported on 07/12/2019 10/09/18 06/28/19  Margie Ege A, DO  levETIRAcetam (KEPPRA) 500 MG tablet Take 1 tablet (500 mg total) by mouth 2 (two) times daily for 4 days. Patient not taking: Reported on 07/12/2019 07/02/19 07/06/19  Dorcas Carrow, MD  pregabalin (LYRICA) 100 MG capsule Take  1 capsule (100 mg total) by mouth 2 (two) times daily. Patient not taking: Reported on 07/12/2019 09/27/15   Joseph Art, DO  SUMAtriptan (IMITREX) 100 MG tablet Take 1 tablet (100 mg total) by mouth every 2 (two) hours as needed for migraine or headache (maximum 3 tablets  in 24 hours). Patient not taking: Reported on 07/12/2019 07/02/19   Dorcas Carrow, MD    Allergies    Gabapentin and Cefadroxil  Review of Systems   Review of Systems  Unable to perform ROS: Mental status change    Physical Exam Updated Vital Signs BP 130/74   Pulse 92   Temp 100.2 F (37.9 C)   Resp 20   SpO2 100%   Physical Exam Vitals and nursing note reviewed.  Constitutional:      Appearance: She is well-developed. She is ill-appearing.  HENT:     Head: Normocephalic.     Mouth/Throat:     Mouth: Mucous membranes are dry.  Eyes:     General:        Right eye: No discharge.        Left eye: No discharge.     Conjunctiva/sclera: Conjunctivae normal.  Neck:     Trachea: No  tracheal deviation.  Cardiovascular:     Rate and Rhythm: Normal rate and regular rhythm.  Pulmonary:     Effort: Pulmonary effort is normal.     Breath sounds: Rhonchi present.  Abdominal:     General: There is no distension.     Palpations: Abdomen is soft.     Tenderness: There is no abdominal tenderness. There is no guarding.  Musculoskeletal:        General: Swelling present. No tenderness.     Cervical back: Normal range of motion and neck supple.  Skin:    General: Skin is warm.     Capillary Refill: Capillary refill takes less than 2 seconds.     Findings: No rash.  Neurological:     GCS: GCS eye subscore is 3. GCS verbal subscore is 1. GCS motor subscore is 5.     Comments: Patient is somnolent, will open eyes to loud verbal stimuli, general weakness, unable to lift legs on her own, will not follow most commands.  Pupils equal bilateral approximately 3 mm.     ED Results / Procedures / Treatments   Labs (all labs ordered are listed, but only abnormal results are displayed) Labs Reviewed  COMPREHENSIVE METABOLIC PANEL - Abnormal; Notable for the following components:      Result Value   Glucose, Bld 139 (*)    BUN 25 (*)    Total Protein 6.4 (*)    Albumin 3.4 (*)    All other components within normal limits  CBC WITH DIFFERENTIAL/PLATELET - Abnormal; Notable for the following components:   WBC 20.6 (*)    Neutro Abs 16.8 (*)    Monocytes Absolute 1.9 (*)    Abs Immature Granulocytes 0.16 (*)    All other components within normal limits  URINALYSIS, ROUTINE W REFLEX MICROSCOPIC - Abnormal; Notable for the following components:   Color, Urine AMBER (*)    All other components within normal limits  CBG MONITORING, ED - Abnormal; Notable for the following components:   Glucose-Capillary 140 (*)    All other components within normal limits  I-STAT VENOUS BLOOD GAS, ED - Abnormal; Notable for the following components:   pH, Ven 7.444 (*)    pCO2, Ven 40.8 (*)  Acid-Base Excess 4.0 (*)    Calcium, Ion 1.07 (*)    All other components within normal limits  SARS CORONAVIRUS 2 BY RT PCR (HOSPITAL ORDER, [place] [facility])  URINE CULTURE  CULTURE, BLOOD (ROUTINE X 2)  CULTURE, BLOOD (ROUTINE X 2)  ETHANOL  AMMONIA  BLOOD GAS, VENOUS  PROTIME-INR  TYPE AND SCREEN    EKG None  Radiology CT Head Wo Contrast  Result Date: 07/12/2019 CLINICAL DATA:  Somnolent.  History of subdural hematoma. EXAM: CT HEAD WITHOUT CONTRAST TECHNIQUE: Contiguous axial images were obtained from the base of the skull through the vertex without intravenous contrast. COMPARISON:  Most recent head CT 07/02/2019 FINDINGS: Brain: Mixed density holo hemispheric right subdural hematoma measures up to 17 mm in depth adjacent to the frontal lobe, increased from prior exam. Mixed density with some residual or recurrent acute blood products. There is increased right to left midline shift 11 mm, previously 3 mm. Mass effect on the right lateral ventricle which has progressed. There is mild sulcal effacement throughout the right cerebral hemisphere that has progressed. Small amount of subdural blood tracks along the superior falx. No intraventricular or intraparenchymal blood. No evidence of acute ischemia. Vascular: No hyperdense vessel. Skull: No fracture or focal lesion. Sinuses/Orbits: Minor mucosal thickening throughout ethmoid air cells. Bilateral cataract resection. Mastoid air cells are clear. Other: None. IMPRESSION: Increased size of mixed density right subdural hematoma, now measuring up to 17 mm in depth adjacent to the frontal lobe. Mixed density with some residual or recurrent acute blood products. Increased right to left midline shift 11 mm, previously 3 mm. Increased mass effect on the right lateral ventricle. These results were called by telephone at the time of interpretation on 07/12/2019 at 7:56 pm to Dr Elnora Morrison , who verbally acknowledged these results.  Electronically Signed   By: Keith Rake M.D.   On: 07/12/2019 19:56   DG Chest Portable 1 View  Result Date: 07/12/2019 CLINICAL DATA:  Cough. EXAM: PORTABLE CHEST 1 VIEW COMPARISON:  07/01/2019.  CT 06/28/2019 FINDINGS: Lung volumes are low. Mild cardiomegaly is stable. The a planted loop recorder is not as well visualized on the current exam. No pulmonary edema, confluent airspace disease, pleural effusion or large pneumothorax. Patient's chin obscures the right apex. Remote right rib fractures. No acute osseous abnormalities are seen. IMPRESSION: Low lung volumes without acute abnormality. Electronically Signed   By: Keith Rake M.D.   On: 07/12/2019 19:58    Procedures .Critical Care Performed by: Elnora Morrison, MD Authorized by: Elnora Morrison, MD   Critical care provider statement:    Critical care time (minutes):  40   Critical care start time:  07/12/2019 7:40 PM   Critical care end time:  07/12/2019 8:20 PM   Critical care time was exclusive of:  Separately billable procedures and treating other patients and teaching time   Critical care was necessary to treat or prevent imminent or life-threatening deterioration of the following conditions:  CNS failure or compromise   Critical care was time spent personally by me on the following activities:  Discussions with consultants, evaluation of patient's response to treatment, examination of patient, ordering and performing treatments and interventions, ordering and review of laboratory studies, ordering and review of radiographic studies, pulse oximetry, re-evaluation of patient's condition, obtaining history from patient or surrogate and review of old charts   (including critical care time)  Medications Ordered in ED Medications - No data to display  ED  Course  I have reviewed the triage vital signs and the nursing notes.  Pertinent labs & imaging results that were available during my care of the patient were reviewed by me and  considered in my medical decision making (see chart for details).    MDM Rules/Calculators/A&P                       Patient presents hospital with altered mental status.  Differential diagnosis includes intracranial hemorrhage/worsening subdural, urine infection, hypercapnia, metabolic, stroke, ammonia related, medication side effect, other.  Reviewed recent discharge summary for subdural hemorrhage.  Blood work ordered, portable chest x-ray, CT scan of the head and EKG. Blood work reviewed white count 20.6, glucose 139, pH 7.44, urinalysis pending. Discussed with radiology unfortunately worsening subdural hemorrhage with left shift. Discussed with neurosurgery who will evaluate the patient and give recommendations. Chest x-ray reviewed no acute abnormalities.  Discussed again with Dr. Johnsie Cancel and plan for taking patient directly to the operating room. COVID neg reviewed.  On reassessments no acute change in mental status, opens eyes to loud verbal, currently protecting airway.  We will hold on intubation for operating room unless clinically deteriorates.   Final Clinical Impression(s) / ED Diagnoses Final diagnoses:  Acute encephalopathy  Subdural hemorrhage (HCC)  Leukocytosis, unspecified type    Rx / DC Orders ED Discharge Orders    None       Blane Ohara, MD 07/13/19 [phone]

## 2019-07-12 NOTE — Anesthesia Procedure Notes (Signed)
Procedure Name: Intubation Date/Time: 07/12/2019 10:22 PM Performed by: Sammie Bench, CRNA Pre-anesthesia Checklist: Patient identified, Emergency Drugs available, Suction available and Patient being monitored Patient Re-evaluated:Patient Re-evaluated prior to induction Oxygen Delivery Method: Circle System Utilized Preoxygenation: Pre-oxygenation with 100% oxygen Induction Type: IV induction Ventilation: Mask ventilation without difficulty Laryngoscope Size: Mac and 3 Grade View: Grade I Tube type: Oral Tube size: 7.0 mm Number of attempts: 1 Airway Equipment and Method: Stylet and Oral airway Placement Confirmation: ETT inserted through vocal cords under direct vision,  positive ETCO2 and breath sounds checked- equal and bilateral Secured at: 22 cm Tube secured with: Tape Dental Injury: Teeth and Oropharynx as per pre-operative assessment

## 2019-07-12 NOTE — ED Triage Notes (Signed)
Pt bib ems from [facility] for AMS. According to EMS, pt recently discharged from here to [facility] after sustaining a SDH. Pt apparently was talking and expressing needs for the first few days while being at [facility] [employer]. Pt now somnolent. Slowly progressed to the state that she is in now per EMS. VSS with EMS. Pt has most for at the bedside.

## 2019-07-12 NOTE — Transfer of Care (Signed)
Immediate Anesthesia Transfer of Care Note  Patient: Lindsey Rowe  Procedure(s) Performed: Guss Bunde for HEMATOMA EVACUATION SUBDURAL (Right Head)  Patient Location: PACU  Anesthesia Type:General  Level of Consciousness: awake, patient cooperative and responds to stimulation  Airway & Oxygen Therapy: Patient Spontanous Breathing and Patient connected to nasal cannula oxygen  Post-op Assessment: Report given to RN and Post -op Vital signs reviewed and stable  Post vital signs: Reviewed and stable  Last Vitals:  Vitals Value Taken Time  BP 153/71 07/12/19 2313  Temp    Pulse 91 07/12/19 2314  Resp 22 07/12/19 2314  SpO2 99 % 07/12/19 2314  Vitals shown include unvalidated device data.  Last Pain:  Vitals:   07/12/19 2112  TempSrc: Rectal  PainSc:          Complications: No apparent anesthesia complications

## 2019-07-12 NOTE — Anesthesia Preprocedure Evaluation (Addendum)
Anesthesia Evaluation  Patient identified by MRN, date of birth, ID band Patient awake    Reviewed: Allergy & Precautions, H&P , NPO status , Patient's Chart, lab work & pertinent test results  History of Anesthesia Complications (+) history of anesthetic complications  Airway Mallampati: III  TM Distance: >3 FB Neck ROM: Full    Dental  (+) Missing, Poor Dentition, Dental Advisory Given   Pulmonary asthma , COPD,    Pulmonary exam normal breath sounds clear to auscultation       Cardiovascular hypertension, Pt. on medications Normal cardiovascular exam Rhythm:Regular Rate:Normal  Echo 06/2017 - Left ventricle: The cavity size was normal. Wall thickness was increased in a pattern of mild LVH. Systolic function was normal. The estimated ejection fraction was in the range of 60% to 65%. Wall motion was normal; there were no regional wall motion abnormalities. Doppler parameters are consistent with abnormal left ventricular relaxation (grade 1 diastolic dysfunction). The E/e&' ratio is between 8-15, suggesting indeterminate LV filling pressure.  - Aorta: Ascending aortic diameter: 43 mm (S).  - Ascending aorta: The ascending aorta was moderately dilated.  - Left atrium: The atrium was normal in size.  - Inferior vena cava: The vessel was normal in size. The  respirophasic diameter changes were in the normal range (>= 50%), consistent with normal central venous pressure.   Impressions:  - Compared to a prior study in 2018, the LVEF is unchanged. The degree of diastolic dysfunction is less. 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    Neuro/Psych  Headaches, PSYCHIATRIC DISORDERS Anxiety Depression Bipolar Disorder    GI/Hepatic GERD  ,  Endo/Other  Hypothyroidism Morbid obesity  Renal/GU Renal disease     Musculoskeletal  (+) Arthritis ,   Abdominal (+) + obese,   Peds  Hematology   Anesthesia Other  Findings   Reproductive/Obstetrics                          Anesthesia Physical  Anesthesia Plan  ASA: III  Anesthesia Plan: General   Post-op Pain Management:    Induction: Intravenous  PONV Risk Score and Plan: Ondansetron, Treatment may vary due to age or medical condition and Dexamethasone  Airway Management Planned: Oral ETT  Additional Equipment: None  Intra-op Plan:   Post-operative Plan: Extubation in OR  Informed Consent: I have reviewed the patients History and Physical, chart, labs and discussed the procedure including the risks, benefits and alternatives for the proposed anesthesia with the patient or authorized representative who has indicated his/her understanding and acceptance.     Dental advisory given  Plan Discussed with: CRNA  Anesthesia Plan Comments:       Anesthesia Quick Evaluation

## 2019-07-12 NOTE — H&P (Signed)
Neurosurgery H&P  CC: Altered mental status  HPI: This is a 74 y.o. woman that presents with progressive somnolence. She was recently admitted for an acute right sided subdural hematoma, did well with non-operative management, and was discharged. Due to her obtundation, unable to obtain any further history from the patient. Per her medication list, she is not on any anticoagulants or antiplatelet agents.  ROS: A 14 point ROS was performed and is negative except as noted in the HPI.   PMHx:  Past Medical History:  Diagnosis Date  . Arthritis   . Back pain   . Bipolar 1 disorder (HCC)   . Complication of anesthesia    hard to wake up anesthesia  . Depression   . GERD (gastroesophageal reflux disease)   . Headache(784.0)    migraines and tension headaches  . Hyperlipidemia   . Hypertension   . Hypothyroidism   . Thyroid disease    FamHx:  Family History  Problem Relation Age of Onset  . Heart disease Mother   . Emphysema Father   . Diabetes Sister   . Other Brother        Brain tumor - s/p excision   SocHx:  reports that she has never smoked. She has never used smokeless tobacco. She reports current alcohol use. She reports that she does not use drugs.  Exam: Vital signs in last 24 hours: Temp:  [98.8 F (37.1 C)-100.2 F (37.9 C)] 100.2 F (37.9 C) (06/06 2130) Pulse Rate:  [86-99] 92 (06/06 2130) Resp:  [20-25] 20 (06/06 2130) BP: (130-157)/(73-120) 130/74 (06/06 2130) SpO2:  [98 %-100 %] 100 % (06/06 2130) General: Lying in hospital bed, appears acutely ill Head: Normocephalic and atruamatic HEENT: Neck supple Pulmonary: breathing room air comfortably, no evidence of increased work of breathing Cardiac: RRR Abdomen: S NT ND Extremities: Warm and well perfused x4 Neuro:  Eyes open spontaneously, does not track or make eye contact, no responses to questions but I did get her to say her name once, PERRL with neutral gaze, not following commands, withdrawing  x4  Assessment and Plan: 74 y.o. woman w/ prior R aSDH, now w/ obtundation. CTH personally reviewed, which shows large expansion of R SDH with 2mm of midline shift.   -OR now for burr hole evacuation. No family available, patient is obtunded and not consentable, will perform as an emergency. -4N ICU post-op  Jadene Pierini, MD 07/12/19 9:57 PM [facility]

## 2019-07-12 NOTE — ED Notes (Signed)
Pt to CT

## 2019-07-13 ENCOUNTER — Inpatient Hospital Stay (HOSPITAL_COMMUNITY): Payer: Medicare HMO

## 2019-07-13 LAB — CBC
HCT: 40.3 % (ref 36.0–46.0)
Hemoglobin: 12.7 g/dL (ref 12.0–15.0)
MCH: 29.7 pg (ref 26.0–34.0)
MCHC: 31.5 g/dL (ref 30.0–36.0)
MCV: 94.4 fL (ref 80.0–100.0)
Platelets: 329 10*3/uL (ref 150–400)
RBC: 4.27 MIL/uL (ref 3.87–5.11)
RDW: 14.7 % (ref 11.5–15.5)
WBC: 16.2 10*3/uL — ABNORMAL HIGH (ref 4.0–10.5)
nRBC: 0 % (ref 0.0–0.2)

## 2019-07-13 LAB — URINE CULTURE: Culture: <10000 — AB

## 2019-07-13 LAB — CREATININE, SERUM
Creatinine, Ser: 0.91 mg/dL (ref 0.44–1.00)
GFR calc Af Amer: >60 mL/min (ref >60)
GFR calc non Af Amer: >60 mL/min (ref >60)

## 2019-07-13 MED ORDER — ORAL CARE MOUTH RINSE
15.0000 mL | Freq: Two times a day (BID) | OROMUCOSAL | Status: DC
  Administered 2019-07-13 – 2019-07-16 (×7): 15 mL via OROMUCOSAL

## 2019-07-13 MED ORDER — BUSPIRONE HCL 5 MG PO TABS
10.0000 mg | ORAL_TABLET | Freq: Two times a day (BID) | ORAL | Status: DC
  Administered 2019-07-13 – 2019-07-16 (×7): 10 mg via ORAL
  Filled 2019-07-13: qty 2
  Filled 2019-07-13: qty 1
  Filled 2019-07-13 (×2): qty 2
  Filled 2019-07-13 (×2): qty 1
  Filled 2019-07-13: qty 2

## 2019-07-13 MED ORDER — LOPERAMIDE HCL 2 MG PO CAPS
2.0000 mg | ORAL_CAPSULE | Freq: Three times a day (TID) | ORAL | Status: DC | PRN
  Filled 2019-07-13: qty 1

## 2019-07-13 MED ORDER — LEVOTHYROXINE SODIUM 75 MCG PO TABS
75.0000 ug | ORAL_TABLET | Freq: Every day | ORAL | Status: DC
  Administered 2019-07-14 – 2019-07-16 (×3): 75 ug via ORAL
  Filled 2019-07-13 (×3): qty 1

## 2019-07-13 MED ORDER — MUSCLE RUB 10-15 % EX CREA
TOPICAL_CREAM | Freq: Two times a day (BID) | CUTANEOUS | Status: DC
  Filled 2019-07-13: qty 85

## 2019-07-13 MED ORDER — POLYETHYLENE GLYCOL 3350 17 G PO PACK: 17.0000 g | PACK | Freq: Every day | ORAL | Status: DC | PRN

## 2019-07-13 MED ORDER — HEPARIN SODIUM (PORCINE) 5000 UNIT/ML IJ SOLN
5000.0000 [IU] | Freq: Three times a day (TID) | INTRAMUSCULAR | Status: DC
  Administered 2019-07-15 – 2019-07-16 (×5): 5000 [IU] via SUBCUTANEOUS
  Filled 2019-07-13 (×5): qty 1

## 2019-07-13 MED ORDER — FAMOTIDINE 20 MG PO TABS
20.0000 mg | ORAL_TABLET | Freq: Two times a day (BID) | ORAL | Status: DC
  Administered 2019-07-13 – 2019-07-16 (×6): 20 mg via ORAL
  Filled 2019-07-13 (×6): qty 1

## 2019-07-13 MED ORDER — PROMETHAZINE HCL 25 MG PO TABS
12.5000 mg | ORAL_TABLET | ORAL | Status: DC | PRN
  Administered 2019-07-15: 25 mg via ORAL
  Filled 2019-07-13: qty 1

## 2019-07-13 MED ORDER — LORAZEPAM 0.5 MG PO TABS: 0.5000 mg | ORAL_TABLET | Freq: Two times a day (BID) | ORAL | Status: DC | PRN

## 2019-07-13 MED ORDER — FLUTICASONE PROPIONATE 50 MCG/ACT NA SUSP: 1.0000 | Freq: Every day | NASAL | Status: DC | PRN

## 2019-07-13 MED ORDER — HYDROMORPHONE HCL 1 MG/ML IJ SOLN
0.5000 mg | INTRAMUSCULAR | Status: DC | PRN
  Administered 2019-07-13: 0.5 mg via INTRAVENOUS
  Filled 2019-07-13: qty 1

## 2019-07-13 MED ORDER — MENTHOL (TOPICAL ANALGESIC) 4 % EX GEL: 1.0000 "application " | Freq: Two times a day (BID) | CUTANEOUS | Status: DC

## 2019-07-13 MED ORDER — FAMOTIDINE IN NACL 20-0.9 MG/50ML-% IV SOLN
20.0000 mg | Freq: Two times a day (BID) | INTRAVENOUS | Status: DC
  Administered 2019-07-13 (×2): 20 mg via INTRAVENOUS
  Filled 2019-07-13 (×2): qty 50

## 2019-07-13 MED ORDER — HYDROCODONE-ACETAMINOPHEN 5-325 MG PO TABS
1.0000 | ORAL_TABLET | ORAL | Status: DC | PRN
  Administered 2019-07-16: 1 via ORAL
  Filled 2019-07-13: qty 1

## 2019-07-13 MED ORDER — PANTOPRAZOLE SODIUM 40 MG PO TBEC
40.0000 mg | DELAYED_RELEASE_TABLET | Freq: Every day | ORAL | Status: DC
  Administered 2019-07-13 – 2019-07-16 (×4): 40 mg via ORAL
  Filled 2019-07-13 (×4): qty 1

## 2019-07-13 MED ORDER — ACETAMINOPHEN 650 MG RE SUPP: 650.0000 mg | RECTAL | Status: DC | PRN

## 2019-07-13 MED ORDER — PRAVASTATIN SODIUM 40 MG PO TABS
40.0000 mg | ORAL_TABLET | Freq: Every evening | ORAL | Status: DC
  Administered 2019-07-13 – 2019-07-15 (×3): 40 mg via ORAL
  Filled 2019-07-13 (×3): qty 1

## 2019-07-13 MED ORDER — LABETALOL HCL 5 MG/ML IV SOLN: 10.0000 mg | INTRAVENOUS | Status: DC | PRN

## 2019-07-13 MED ORDER — ALBUTEROL SULFATE (2.5 MG/3ML) 0.083% IN NEBU: 2.5000 mg | INHALATION_SOLUTION | Freq: Four times a day (QID) | RESPIRATORY_TRACT | Status: DC | PRN

## 2019-07-13 MED ORDER — CHLORHEXIDINE GLUCONATE CLOTH 2 % EX PADS
6.0000 | MEDICATED_PAD | Freq: Every day | CUTANEOUS | Status: DC
  Administered 2019-07-13 – 2019-07-16 (×4): 6 via TOPICAL

## 2019-07-13 MED ORDER — ONDANSETRON HCL 4 MG/2ML IJ SOLN: 4.0000 mg | INTRAMUSCULAR | Status: DC | PRN

## 2019-07-13 MED ORDER — TIZANIDINE HCL 4 MG PO TABS
2.0000 mg | ORAL_TABLET | Freq: Three times a day (TID) | ORAL | Status: DC | PRN
  Administered 2019-07-13 – 2019-07-15 (×2): 2 mg via ORAL
  Filled 2019-07-13 (×3): qty 1

## 2019-07-13 MED ORDER — DULOXETINE HCL 30 MG PO CPEP
30.0000 mg | ORAL_CAPSULE | Freq: Two times a day (BID) | ORAL | Status: DC
  Administered 2019-07-13 – 2019-07-16 (×7): 30 mg via ORAL
  Filled 2019-07-13 (×9): qty 1

## 2019-07-13 MED ORDER — CYCLOSPORINE 0.05 % OP EMUL
1.0000 [drp] | Freq: Two times a day (BID) | OPHTHALMIC | Status: DC
  Administered 2019-07-13 – 2019-07-16 (×7): 1 [drp] via OPHTHALMIC
  Filled 2019-07-13 (×9): qty 1

## 2019-07-13 MED ORDER — CEFAZOLIN SODIUM-DEXTROSE 2-4 GM/100ML-% IV SOLN
2.0000 g | Freq: Three times a day (TID) | INTRAVENOUS | Status: AC
  Administered 2019-07-13 (×2): 2 g via INTRAVENOUS
  Filled 2019-07-13 (×2): qty 100

## 2019-07-13 MED ORDER — LISINOPRIL 10 MG PO TABS
10.0000 mg | ORAL_TABLET | Freq: Every day | ORAL | Status: DC
  Administered 2019-07-13 – 2019-07-16 (×3): 10 mg via ORAL
  Filled 2019-07-13 (×5): qty 1

## 2019-07-13 MED ORDER — ONDANSETRON HCL 4 MG PO TABS: 4.0000 mg | ORAL_TABLET | ORAL | Status: DC | PRN

## 2019-07-13 MED ORDER — ACETAMINOPHEN 325 MG PO TABS
650.0000 mg | ORAL_TABLET | ORAL | Status: DC | PRN
  Administered 2019-07-14: 650 mg via ORAL
  Filled 2019-07-13: qty 2

## 2019-07-13 NOTE — Evaluation (Signed)
Occupational Therapy Evaluation Patient Details Name: Lindsey Rowe MRN: [id number] DOB: 25-Dec-1945 Today's Date: 07/13/2019    History of Present Illness 74 y.o. woman w/ prior R aSDH, now w/ obtundation. CT shows large expansion of R SDH with 62mm of midline shift.  -OR now for burr hole evacuation 07/11/2009.PHMx: Bipoloar, depression back pain, arthritis   Clinical Impression   This 74 yo female admitted with above presents to acute OT with recent hospitalization of R SDH not back due expansion of this with burr hole evacuation. Was at [facility] prior to her return here. Pt total A +2 for all mobility, with Max-total A for sitting balance at EOB and not reaction to attempt to wash her face (except grimace to pain in RUE). Pt with blank staring at times, able to re-focus her x1 with blink to threat. She will benefit from acute OT with follow up at SNF.    Follow Up Recommendations  SNF    Equipment Recommendations  Other (comment)(TBD at SNF)       Precautions / Restrictions Precautions Precautions: Fall Restrictions Weight Bearing Restrictions: No      Mobility Bed Mobility Overal bed mobility: Needs Assistance Bed Mobility: Supine to Sit;Sit to Supine     Supine to sit: +2 for physical assistance;Total assist Sit to supine: Total assist;+2 for physical assistance      Transfers Overall transfer level: Needs assistance   Transfers: Sit to/from Stand Sit to Stand: Total assist;+2 physical assistance         General transfer comment: Did not clear bottom off of bed with sit<>stand with use of gait belt and bed pad to A    Balance Overall balance assessment: Needs assistance Sitting-balance support: Feet supported;Bilateral upper extremity supported Sitting balance-Leahy Scale: Zero Sitting balance - Comments: Bil UEs on bed, but pt not using them to actively support herself. Varied from poor to zero for sitting balance with right lateral and forward lean                                    ADL either performed or assessed with clinical judgement   ADL Overall ADL's : Needs assistance/impaired                                       General ADL Comments: Total A for all basic ADLs. Attempted hand over hand with both arms for having pt try and wash face (pt without attempt to move washcloth or face to wash her face)     Vision   Additional Comments: Pt with periods of staring off into space, was able on one occassion to get her to blink to threat and then she responded visually to direction being spoken to. Intermittent turning of eyes in direction to voice stimulus. Did track me around her bed x1 upon initial entry            Pertinent Vitals/Pain Pain Assessment: Faces Faces Pain Scale: Hurts even more Pain Location: right arm when moved initially with PROM; also LLE with PROM Pain Descriptors / Indicators: Grimacing;Guarding Pain Intervention(s): Limited activity within patient's tolerance;Monitored during session;Repositioned        Extremity/Trunk Assessment Upper Extremity Assessment Upper Extremity Assessment: RUE deficits/detail;LUE deficits/detail RUE Deficits / Details: Not moving spontaneously, right arm painful when intially moved (per RN  they have x-rayed entire arm without fxs seen), full PROM LUE Deficits / Details: Not moving spontaneously, wrist fused, tightness in elbow felt, shoulder WNL PROM           Communication Communication Communication: Receptive difficulties;Expressive difficulties   Cognition Arousal/Alertness: Awake/alert(with some periods of eye closure while supine) Behavior During Therapy: Flat affect(with occassional smiling/laughing) Overall Cognitive Status: Impaired/Different from baseline Area of Impairment: Following commands;Safety/judgement;Problem solving;Attention                   Current Attention Level: Sustained   Following Commands: Follows one  step commands inconsistently;Follows one step commands with increased time Safety/Judgement: Decreased awareness of safety;Decreased awareness of deficits Awareness: Intellectual Problem Solving: Slow processing;Decreased initiation;Requires verbal cues;Requires tactile cues General Comments: Pt with very little communication. She did say hi once and bye x2 with head turn to right to say bye to therapist on right side after therapist sais bye to her first              Home Living Family/patient expects to be discharged to:: Skilled nursing facility                                 Additional Comments: Was a [facility] pta--D/C'd there after recent admission for Right SDH               OT Problem List: Decreased strength;Decreased range of motion;Impaired balance (sitting and/or standing);Impaired vision/perception;Decreased coordination;Decreased cognition;Decreased safety awareness;Pain;Impaired tone;Obesity;Impaired UE functional use      OT Treatment/Interventions: Self-care/ADL training;DME and/or AE instruction;Patient/family education;Balance training;Visual/perceptual remediation/compensation;Cognitive remediation/compensation;Therapeutic activities    OT Goals(Current goals can be found in the care plan section) Acute Rehab OT Goals Patient Stated Goal: Pt unable to state (minimally verbal at this time) OT Goal Formulation: Patient unable to participate in goal setting Time For Goal Achievement: 07/27/19 Potential to Achieve Goals: Good  OT Frequency: Min 2X/week   Barriers to D/C: Decreased caregiver support          Co-evaluation PT/OT/SLP Co-Evaluation/Treatment: Yes Reason for Co-Treatment: For patient/therapist safety;To address functional/ADL transfers PT goals addressed during session: Strengthening/ROM;Mobility/safety with mobility;Balance OT goals addressed during session: ADL's and self-care;Strengthening/ROM      AM-PAC OT "6 Clicks"  Daily Activity     Outcome Measure Help from another person eating meals?: Total Help from another person taking care of personal grooming?: Total Help from another person toileting, which includes using toliet, bedpan, or urinal?: Total Help from another person bathing (including washing, rinsing, drying)?: Total Help from another person to put on and taking off regular upper body clothing?: Total Help from another person to put on and taking off regular lower body clothing?: Total 6 Click Score: 6   End of Session Equipment Utilized During Treatment: Gait belt Nurse Communication: Mobility status(did not really follow commands for [place], did get three words out of her)  Activity Tolerance: Patient tolerated treatment well Patient left: in bed(in chair position)  OT Visit Diagnosis: Unsteadiness on feet (R26.81);Other abnormalities of gait and mobility (R26.89);Repeated falls (R29.6);Muscle weakness (generalized) (M62.81);History of falling (Z91.81);Pain;Other symptoms and signs involving cognitive function;Cognitive communication deficit (R41.841) Symptoms and signs involving cognitive functions: (SDH) Pain - Right/Left: Right(arm) Pain - part of body: Arm                Time: 6644-0347 OT Time Calculation (min): 22 min Charges:  OT General Charges $OT Visit: 1  Visit OT Evaluation $OT Eval Moderate Complexity: 1 Mod  Golden Circle, OTR/L Acute [employer] Pager [phone] Office [phone]     Almon Register 07/13/2019, 10:29 AM

## 2019-07-13 NOTE — Evaluation (Signed)
Physical Therapy Evaluation Patient Details Name: Lindsey Rowe MRN: [id number] DOB: 07-31-1945 Today's Date: 07/13/2019   History of Present Illness  74 y.o. woman w/ prior R aSDH, now w/ obtundation. CT shows large expansion of R SDH with 76mm of midline shift.  -OR now for burr hole evacuation 07/11/2009.PHMx: Bipoloar, depression back pain, arthritis  Clinical Impression  Pt admitted with above. Pt maintaining eyes wide open and frequent staring. Pt with inconsistent command follow with R UE and LE. Pt with minimal verbalization but laughs/smiles a lot as response to PT, OT, or RN. Pt with no initiation of movement or assist with transfers. Pt total assist for all mobility and ADLs at this time. Acute PT to cont to follow. Cont to recommend SNF upon d/c.    Follow Up Recommendations SNF    Equipment Recommendations  None recommended by PT    Recommendations for Other Services       Precautions / Restrictions Precautions Precautions: Fall Restrictions Weight Bearing Restrictions: No      Mobility  Bed Mobility Overal bed mobility: Needs Assistance Bed Mobility: Supine to Sit;Sit to Supine     Supine to sit: Total assist;+2 for physical assistance Sit to supine: Total assist;+2 for physical assistance   General bed mobility comments: pt with no initiation or assist with transfers  Transfers Overall transfer level: Needs assistance Equipment used: (2 person lift with gait belt and bed pad) Transfers: Sit to/from Stand Sit to Stand: Total assist;+2 physical assistance         General transfer comment: unable to achieve full upright position, minimal clearance of bottom  Ambulation/Gait                Stairs            Wheelchair Mobility    Modified Rankin (Stroke Patients Only) Modified Rankin (Stroke Patients Only) Pre-Morbid Rankin Score: Slight disability Modified Rankin: Severe disability     Balance Overall balance assessment: Needs  assistance Sitting-balance support: Feet supported;Bilateral upper extremity supported Sitting balance-Leahy Scale: Poor Sitting balance - Comments: Bil UEs on bed, but pt not using them to actively support herself. Varied from poor to zero for sitting balance with right lateral and forward lean   Standing balance support: Bilateral upper extremity supported;During functional activity Standing balance-Leahy Scale: Zero                               Pertinent Vitals/Pain Pain Assessment: Faces Faces Pain Scale: Hurts little more Pain Location: grimaces with R arm movement, improved once some PROM completed Pain Descriptors / Indicators: Grimacing;Guarding Pain Intervention(s): Limited activity within patient's tolerance;Monitored during session;Repositioned    Home Living Family/patient expects to be discharged to:: Skilled nursing facility                 Additional Comments: Was at [facility] pta--D/C'd there after recent admission for Right SDH    Prior Function Level of Independence: Needs assistance         Comments: pt non-verbal, poor historian, pt was at [facility] so suspect pt requiring assist for all mobility and ADLs     Hand Dominance        Extremity/Trunk Assessment   Upper Extremity Assessment Upper Extremity Assessment: Defer to OT evaluation RUE Deficits / Details: Not moving spontaneously, right arm painful when intially moved (per RN they have x-rayed entire arm without fxs seen), full  PROM LUE Deficits / Details: Not moving spontaneously, wrist fused, tightness in elbow felt, shoulder WNL PROM    Lower Extremity Assessment Lower Extremity Assessment: Generalized weakness(bilat stiffness, weakness, almost rigid)    Cervical / Trunk Assessment Cervical / Trunk Assessment: Kyphotic  Communication   Communication: Expressive difficulties(pt only said "Hi" once and "bye" 2x, pt laughs freq)  Cognition Arousal/Alertness:  Awake/alert(with some periods of eye closure while supine) Behavior During Therapy: Flat affect(with occassional smiling/laughing) Overall Cognitive Status: No family/caregiver present to determine baseline cognitive functioning Area of Impairment: Following commands;Safety/judgement;Problem solving;Attention                   Current Attention Level: Sustained Memory: Decreased short-term memory Following Commands: Follows one step commands inconsistently;Follows one step commands with increased time Safety/Judgement: Decreased awareness of safety;Decreased awareness of deficits Awareness: Intellectual Problem Solving: Slow processing;Decreased initiation;Requires verbal cues;Requires tactile cues General Comments: pt with little communication, inconsistent tracking/turning of head towards voice, occasional gazing off into space      General Comments General comments (skin integrity, edema, etc.): VSS    Exercises     Assessment/Plan    PT Assessment Patient needs continued PT services  PT Problem List Decreased strength;Decreased range of motion;Decreased activity tolerance;Decreased balance;Decreased mobility;Decreased coordination;Decreased cognition;Decreased knowledge of use of DME;Decreased safety awareness;Decreased knowledge of precautions;Cardiopulmonary status limiting activity;Decreased skin integrity;Obesity       PT Treatment Interventions DME instruction;Gait training;Functional mobility training;Therapeutic activities;Therapeutic exercise;Balance training;Neuromuscular re-education;Patient/family education    PT Goals (Current goals can be found in the Care Plan section)  Acute Rehab PT Goals Patient Stated Goal: Pt unable to state (minimally verbal at this time) PT Goal Formulation: Patient unable to participate in goal setting Time For Goal Achievement: 07/27/19 Potential to Achieve Goals: Fair    Frequency Min 3X/week   Barriers to discharge Decreased  caregiver support      Co-evaluation PT/OT/SLP Co-Evaluation/Treatment: Yes Reason for Co-Treatment: For patient/therapist safety PT goals addressed during session: Mobility/safety with mobility OT goals addressed during session: ADL's and self-care;Strengthening/ROM       AM-PAC PT "6 Clicks" Mobility  Outcome Measure Help needed turning from your back to your side while in a flat bed without using bedrails?: Total Help needed moving from lying on your back to sitting on the side of a flat bed without using bedrails?: Total Help needed moving to and from a bed to a chair (including a wheelchair)?: Total Help needed standing up from a chair using your arms (e.g., wheelchair or bedside chair)?: Total Help needed to walk in hospital room?: Total Help needed climbing 3-5 steps with a railing? : Total 6 Click Score: 6    End of Session Equipment Utilized During Treatment: Gait belt;Oxygen(2 L ) Activity Tolerance: Patient tolerated treatment well Patient left: with call bell/phone within reach;in bed;with bed alarm set Nurse Communication: Mobility status PT Visit Diagnosis: Unsteadiness on feet (R26.81);Muscle weakness (generalized) (M62.81);Difficulty in walking, not elsewhere classified (R26.2)    Time: 1610-9604 PT Time Calculation (min) (ACUTE ONLY): 22 min   Charges:   PT Evaluation $PT Eval Moderate Complexity: 1 Mod          Kittie Plater, PT, DPT Acute Rehabilitation Services Pager #: [phone] Office #: [phone]   Berline Lopes 07/13/2019, 11:32 AM

## 2019-07-13 NOTE — Plan of Care (Signed)
  Problem: Clinical Measurements: Goal: Usual level of consciousness will be regained or maintained. Outcome: Progressing   

## 2019-07-13 NOTE — Anesthesia Postprocedure Evaluation (Signed)
Anesthesia Post Note  Patient: Lindsey Rowe  Procedure(s) Performed: Guss Bunde for HEMATOMA EVACUATION SUBDURAL (Right Head)     Patient location during evaluation: PACU Anesthesia Type: General Level of consciousness: sedated and patient cooperative Pain management: pain level controlled Vital Signs Assessment: post-procedure vital signs reviewed and stable Respiratory status: spontaneous breathing Cardiovascular status: stable Anesthetic complications: no    Last Vitals:  Vitals:   07/12/19 2358 07/13/19 0013  BP: 139/67 130/68  Pulse: (!) 119 86  Resp: (!) 22 20  Temp:    SpO2: 94% 99%    Last Pain:  Vitals:   07/12/19 2343  TempSrc:   PainSc: 0-No pain                 Lewie Loron

## 2019-07-13 NOTE — Progress Notes (Signed)
Neurosurgery Service Progress Note  Subjective: No acute events overnight, exam improved steadily post-op, no complaints except for continued left wrist pain  Objective: Vitals:   07/13/19 0545 07/13/19 0615 07/13/19 0630 07/13/19 0800  BP: 132/66 120/66 121/71   Pulse: 83 85 92   Resp: 16 20 20    Temp:    98.5 F (36.9 C)  TempSrc:    Axillary  SpO2: 94% 92% 93%    Temp (24hrs), Avg:99.1 F (37.3 C), Min:98.1 F (36.7 C), Max:100.2 F (37.9 C)  CBC Latest Ref Rng & Units 07/13/2019 07/12/2019 07/12/2019  WBC 4.0 - 10.5 K/uL 16.2(H) - 20.6(H)  Hemoglobin 12.0 - 15.0 g/dL 09/11/2019 47.8 29.5  Hematocrit 36.0 - 46.0 % 40.3 42.0 44.0  Platelets 150 - 400 K/uL 329 - 320   BMP Latest Ref Rng & Units 07/13/2019 07/12/2019 07/12/2019  Glucose 70 - 99 mg/dL - - 09/11/2019)  BUN 8 - 23 mg/dL - - 308(M)  Creatinine 57(Q - 1.00 mg/dL 4.69 - 6.29  Sodium 5.28 - 145 mmol/L - 137 137  Potassium 3.5 - 5.1 mmol/L - 4.5 4.9  Chloride 98 - 111 mmol/L - - 98  CO2 22 - 32 mmol/L - - 26  Calcium 8.9 - 10.3 mg/dL - - 8.9    Intake/Output Summary (Last 24 hours) at 07/13/2019 09/12/2019 Last data filed at 07/13/2019 09/12/2019 Gross per 24 hour  Intake 494.96 ml  Output 580 ml  Net -85.04 ml    Current Facility-Administered Medications:  .  acetaminophen (TYLENOL) tablet 650 mg, 650 mg, Oral, Q4H PRN **OR** acetaminophen (TYLENOL) suppository 650 mg, 650 mg, Rectal, Q4H PRN, Harlene Petralia A, MD .  albuterol (PROVENTIL) (2.5 MG/3ML) 0.083% nebulizer solution 2.5 mg, 2.5 mg, Inhalation, Q6H PRN, 1027, MD .  busPIRone (BUSPAR) tablet 10 mg, 10 mg, Oral, BID, Jadene Pierini, MD, Stopped at 07/13/19 0130 .  ceFAZolin (ANCEF) IVPB 2g/100 mL premix, 2 g, Intravenous, Q8H, Carmellia Kreisler A, MD, Last Rate: 200 mL/hr at 07/13/19 0600, Rate Verify at 07/13/19 0600 .  Chlorhexidine Gluconate Cloth 2 % PADS 6 each, 6 each, Topical, Daily, Evianna Chandran A, MD .  cycloSPORINE (RESTASIS) 0.05 % ophthalmic  emulsion 1 drop, 1 drop, Both Eyes, BID, Sallie Staron A, MD .  DULoxetine (CYMBALTA) DR capsule 30 mg, 30 mg, Oral, BID, 09/12/19, MD, Stopped at 07/13/19 0224 .  famotidine (PEPCID) IVPB 20 mg premix, 20 mg, Intravenous, Q12H, 09/12/19, MD, Stopped at 07/13/19 0254 .  fluticasone (FLONASE) 50 MCG/ACT nasal spray 1 spray, 1 spray, Each Nare, Daily PRN, 09/12/19, MD .  Jadene Pierini ON 07/15/2019] heparin injection 5,000 Units, 5,000 Units, Subcutaneous, Q8H, Kieren Ricci A, MD .  HYDROcodone-acetaminophen (NORCO/VICODIN) 5-325 MG per tablet 1 tablet, 1 tablet, Oral, Q4H PRN, 09/14/2019, MD .  HYDROmorphone (DILAUDID) injection 0.5 mg, 0.5 mg, Intravenous, Q3H PRN, Jadene Pierini, MD, 0.5 mg at 07/13/19 0549 .  labetalol (NORMODYNE) injection 10-40 mg, 10-40 mg, Intravenous, Q10 min PRN, 07-28-1996, MD .  levothyroxine (SYNTHROID) tablet 75 mcg, 75 mcg, Oral, QAC breakfast, Jadene Pierini, MD, Stopped at 07/13/19 [phone] .  lisinopril (ZESTRIL) tablet 10 mg, 10 mg, Oral, Daily, Tylena Prisk A, MD .  loperamide (IMODIUM) capsule 2 mg, 2 mg, Oral, TID PRN, 2536, MD .  LORazepam (ATIVAN) tablet 0.5 mg, 0.5 mg, Oral, BID PRN, Jadene Pierini, MD .  MEDLINE mouth rinse, 15 mL, Mouth  Rinse, BID, Judith Part, MD, 15 mL at 07/13/19 0224 .  Muscle Rub CREA, , Topical, BID, Keyarra Rendall A, MD .  ondansetron (ZOFRAN) tablet 4 mg, 4 mg, Oral, Q4H PRN **OR** ondansetron (ZOFRAN) injection 4 mg, 4 mg, Intravenous, Q4H PRN, Sheniece Ruggles, Joyice Faster, MD .  pantoprazole (PROTONIX) EC tablet 40 mg, 40 mg, Oral, Daily, Samary Shatz A, MD .  polyethylene glycol (MIRALAX / GLYCOLAX) packet 17 g, 17 g, Oral, Daily PRN, Judith Part, MD .  pravastatin (PRAVACHOL) tablet 40 mg, 40 mg, Oral, QPM, Malaijah Houchen A, MD .  promethazine (PHENERGAN) tablet 12.5-25 mg, 12.5-25 mg, Oral, Q4H PRN, Judith Part, MD .   tiZANidine (ZANAFLEX) tablet 2 mg, 2 mg, Oral, TID PRN, Judith Part, MD   Physical Exam: AOx3 this afternoon, FCx4 but pain with movement of the left wrist, incision c/d/i  Assessment & Plan: 75 y.o. woman with obtundation due to subacute expansion of subdural hematoma, 6/6 s/p burr hole evacuation of SDH, recovering well.  -exam continues to improve -continue home meds -pt has history of DNR status, suspended for OR, will reinstate now -CTH to evaluate evacuation extent / provide baseline in case of recurrence of symptoms -keep in ICU overnight to follow exam, likely transfer to stepdown tomorrow, then return to SNF later this week -wrist pain - XR negative -SCDs/TEDs, hold SQH until POD2  Judith Part  07/13/19 8:38 AM

## 2019-07-13 NOTE — Evaluation (Signed)
Speech Language Pathology Evaluation Patient Details Name: Lindsey Rowe MRN: [id number] DOB: 05-27-1945 Today's Date: 07/13/2019 Time: 1010-1020 SLP Time Calculation (min) (ACUTE ONLY): 10 min  Problem List:  Patient Active Problem List   Diagnosis Date Noted  . Subdural hematoma ([place]) 06/28/2019  . COPD exacerbation ([place]) 10/08/2018  . Asthmatic bronchitis 12/02/2017  . Acute respiratory failure with hypoxia ([place]) 06/27/2017  . Diastolic dysfunction 29/79/8921  . Acute lower UTI 06/27/2017  . Acute metabolic encephalopathy 19/41/7408  . Altered mental state 06/10/2017  . Dyspnea and respiratory abnormalities 08/29/2016  . Hypoxemia 08/29/2016  . Shoulder dislocation, right, initial encounter 05/24/2016  . 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 Hyperglycemia 05/24/2016  . Anterior dislocation of right shoulder   . Syncope 03/24/2016  . Generalized weakness 09/24/2015  . Leg pain, bilateral 09/24/2015  . Depression 09/24/2015  . Loss of appetite 09/24/2015  . Leg weakness, bilateral   . Hypothyroid 12/28/2012  . Hypovolemia 12/28/2012  . Hypotension 08/13/2012  . Dehydration 08/13/2012  . AKI (acute kidney injury) ([place]) 08/13/2012  . Hypoventilation associated with obesity syndrome ([place]) 08/13/2012  . Acute respiratory failure ([place]) 07/26/2012  . Chest pain 07/26/2012  . ANKLE SPRAIN, RIGHT 03/16/2010  . DIVERTICULOSIS, COLON 10/31/2009  . SKIN LESION 09/23/2009  . Shoulder pain, right 07/05/2009  . COUGH 03/29/2009  . Migraine 03/11/2009  . OTITIS MEDIA, RIGHT 12/13/2008  . BRUISE 12/06/2008  . ACID REFLUX DISEASE 09/13/2008  . ALLERGIC RHINITIS 05/20/2008  . UNSPECIFIED DISEASE OF HAIR AND HAIR FOLLICLES 14/48/1856  . DERMATITIS 11/12/2007  . OBESITY 07/18/2007  . HYPERCHOLESTEROLEMIA 05/13/2007  . BIPOLAR AFFECTIVE DISORDER 05/12/2007  . HYPERTENSION, BENIGN ESSENTIAL 05/12/2007  . Low back pain potentially associated with radiculopathy 05/12/2007   Past  Medical History:  Past Medical History:  Diagnosis Date  . Arthritis   . Back pain   . Bipolar 1 disorder ([place])   . Complication of anesthesia    hard to wake up anesthesia  . Depression   . GERD (gastroesophageal reflux disease)   . Headache(784.0)    migraines and tension headaches  . Hyperlipidemia   . Hypertension   . Hypothyroidism   . Thyroid disease    Past Surgical History:  Past Surgical History:  Procedure Laterality Date  . APPENDECTOMY    . BACK SURGERY    . CRANIOTOMY Right 07/12/2019   Procedure: Georganna Skeans for HEMATOMA EVACUATION SUBDURAL;  Surgeon: Judith Part, MD;  Location: [facility];  Service: Neurosurgery;  Laterality: Right;  . EYE SURGERY Bilateral    lens implant  . GASTRIC BYPASS    . LOOP RECORDER INSERTION N/A 05/25/2016   Procedure: Loop Recorder Insertion;  Surgeon: Will Meredith Leeds, MD;  Location: [facility];  Service: Cardiovascular;  Laterality: N/A;  . LUMBAR LAMINECTOMY/DECOMPRESSION MICRODISCECTOMY Right 07/23/2012   Procedure: LUMBAR LAMINECTOMY/DECOMPRESSION MICRODISCECTOMY 2 LEVELS;  Surgeon: Floyce Stakes, MD;  Location: [facility];  Service: Neurosurgery;  Laterality: Right;  Right Lumbar three-four  lumbar four-five Laminectomy/Foraminotomy  . NASAL SINUS SURGERY Bilateral   . TONSILLECTOMY    . WRIST SURGERY     HPI:  Pt is a 74 yo female presenting with progressive somnolence after recent admission for R sided SDH. CT Head showed increased size of SDH, also with increased midline shift and mass effect. S/p burr hole evacuation 6/6. BSE 06/29/19 WFL.  PMH includes: thyroid disease, HTN, HLD, GERD, depression, bipolar 1 d/o, back pain, arthritis   Assessment /  Plan / Recommendation Clinical Impression  Pt has a flat affect and reduced eye contact with limited verbal output that is primarily at the word to phrase level. At times she will laugh instead of answering a question or will make comments like, "you're crazy." She is  oriented to person only and needs Max cues for sustained attention and simple problem solving. Initiation for communication and functional tasks is reduced. She will benefit from SLP f/u to maximize functional cognition and communication.      SLP Assessment  SLP Recommendation/Assessment: Patient needs continued Speech Lanaguage Pathology Services SLP Visit Diagnosis: Cognitive communication deficit (R41.841)    Follow Up Recommendations  Skilled Nursing facility    Frequency and Duration min 2x/week  2 weeks      SLP Evaluation Cognition  Overall Cognitive Status: No family/caregiver present to determine baseline cognitive functioning Arousal/Alertness: Awake/alert Orientation Level: Oriented to person;Disoriented to place;Disoriented to time;Disoriented to situation Attention: Sustained Sustained Attention: Impaired Sustained Attention Impairment: Functional basic;Verbal basic Memory: Impaired Memory Impairment: Decreased recall of new information(impaired immediate recall) Problem Solving: Impaired Problem Solving Impairment: Functional basic;Verbal basic Executive Function: Initiating Initiating: Impaired Initiating Impairment: Functional basic Behaviors: Other (comment)(flat affect) Safety/Judgment: Impaired       Comprehension  Auditory Comprehension Overall Auditory Comprehension: Impaired Yes/No Questions: Impaired Basic Immediate Environment Questions: 0-24% accurate Commands: Impaired One Step Basic Commands: 25-49% accurate Interfering Components: Attention;Processing speed    Expression Expression Primary Mode of Expression: Verbal Verbal Expression Overall Verbal Expression: Impaired Initiation: Impaired Automatic Speech: Name;Social Response Level of Generative/Spontaneous Verbalization: Phrase Pragmatics: Impairment Impairments: Abnormal affect;Eye contact;Monotone Non-Verbal Means of Communication: Not applicable   Oral / Motor  Oral Motor/Sensory  Function Overall Oral Motor/Sensory Function: Within functional limits Motor Speech Overall Motor Speech: Appears within functional limits for tasks assessed   GO                     Mahala Menghini., M.A. CCC-SLP Acute Rehabilitation Services Pager [phone] Office [phone]  07/13/2019, 10:40 AM

## 2019-07-13 NOTE — Evaluation (Signed)
Clinical/Bedside Swallow Evaluation Patient Details  Name: Lindsey Rowe MRN: [id number] Date of Birth: 11-05-1945  Today's Date: 07/13/2019 Time: SLP Start Time (ACUTE ONLY): 1000 SLP Stop Time (ACUTE ONLY): 1010 SLP Time Calculation (min) (ACUTE ONLY): 10 min  Past Medical History:  Past Medical History:  Diagnosis Date  . Arthritis   . Back pain   . Bipolar 1 disorder ([place])   . Complication of anesthesia    hard to wake up anesthesia  . Depression   . GERD (gastroesophageal reflux disease)   . Headache(784.0)    migraines and tension headaches  . Hyperlipidemia   . Hypertension   . Hypothyroidism   . Thyroid disease    Past Surgical History:  Past Surgical History:  Procedure Laterality Date  . APPENDECTOMY    . BACK SURGERY    . CRANIOTOMY Right 07/12/2019   Procedure: Georganna Skeans for HEMATOMA EVACUATION SUBDURAL;  Surgeon: Judith Part, MD;  Location: [facility];  Service: Neurosurgery;  Laterality: Right;  . EYE SURGERY Bilateral    lens implant  . GASTRIC BYPASS    . LOOP RECORDER INSERTION N/A 05/25/2016   Procedure: Loop Recorder Insertion;  Surgeon: Will Meredith Leeds, MD;  Location: [facility];  Service: Cardiovascular;  Laterality: N/A;  . LUMBAR LAMINECTOMY/DECOMPRESSION MICRODISCECTOMY Right 07/23/2012   Procedure: LUMBAR LAMINECTOMY/DECOMPRESSION MICRODISCECTOMY 2 LEVELS;  Surgeon: Floyce Stakes, MD;  Location: [facility];  Service: Neurosurgery;  Laterality: Right;  Right Lumbar three-four  lumbar four-five Laminectomy/Foraminotomy  . NASAL SINUS SURGERY Bilateral   . TONSILLECTOMY    . WRIST SURGERY     HPI:  Pt is a 74 yo female presenting with progressive somnolence after recent admission for R sided SDH. CT Head showed increased size of SDH, also with increased midline shift and mass effect. S/p burr hole evacuation 6/6. BSE 06/29/19 WFL.  PMH includes: thyroid disease, HTN, HLD, GERD, depression, bipolar 1 d/o, back pain, arthritis    Assessment / Plan / Recommendation Clinical Impression  Pt's oropharyngeal swallow appears to be impacted primarily by her cognitive status. She has no overt s/s of aspiration, but she does need assistance to initiate and sustain attention to intake. Her mastication is slow and she has intermittent oral holding. Recommend starting with Dys 2 diet and thin liquids with the potential to advance pending improved mentation.    SLP Visit Diagnosis: Dysphagia, unspecified (R13.10)    Aspiration Risk  Mild aspiration risk    Diet Recommendation Dysphagia 2 (Fine chop);Thin liquid   Liquid Administration via: Cup;Straw Medication Administration: Whole meds with puree Supervision: Staff to assist with self feeding Compensations: Slow rate;Small sips/bites Postural Changes: Seated upright at 90 degrees    Other  Recommendations Oral Care Recommendations: Oral care BID   Follow up Recommendations Skilled Nursing facility      Frequency and Duration min 2x/week  2 weeks       Prognosis Prognosis for Safe Diet Advancement: Good Barriers to Reach Goals: Cognitive deficits      Swallow Study   General HPI: Pt is a 74 yo female presenting with progressive somnolence after recent admission for R sided SDH. CT Head showed increased size of SDH, also with increased midline shift and mass effect. S/p burr hole evacuation 6/6. BSE 06/29/19 WFL.  PMH includes: thyroid disease, HTN, HLD, GERD, depression, bipolar 1 d/o, back pain, arthritis Type of Study: Bedside Swallow Evaluation Previous Swallow Assessment: see HPI Diet Prior to this Study: NPO Temperature  Spikes Noted: Yes(100.2) Respiratory Status: Nasal cannula History of Recent Intubation: (for procedure only on 6/6) Behavior/Cognition: Alert;Cooperative;Requires cueing Oral Cavity Assessment: Within Functional Limits(as can be visualized) Oral Care Completed by SLP: No Oral Cavity - Dentition: Missing dentition Self-Feeding Abilities:  Needs assist Patient Positioning: Upright in bed Baseline Vocal Quality: Normal Volitional Swallow: Able to elicit    Oral/Motor/Sensory Function Overall Oral Motor/Sensory Function: Within functional limits   Ice Chips Ice chips: Impaired Presentation: Spoon Oral Phase Functional Implications: Oral holding   Thin Liquid Thin Liquid: Impaired Presentation: Straw Oral Phase Functional Implications: Oral holding    Nectar Thick Nectar Thick Liquid: Not tested   Honey Thick Honey Thick Liquid: Not tested   Puree Puree: Impaired Presentation: Spoon Oral Phase Functional Implications: Prolonged oral transit   Solid     Solid: Impaired Oral Phase Functional Implications: Impaired mastication;Prolonged oral transit       Mahala Menghini., M.A. CCC-SLP Acute Rehabilitation Services Pager [phone] Office [phone]  07/13/2019,10:34 AM

## 2019-07-14 LAB — POCT I-STAT EG7
Acid-Base Excess: 3 mmol/L — ABNORMAL HIGH (ref 0.0–2.0)
Bicarbonate: 27.7 mmol/L (ref 20.0–28.0)
Calcium, Ion: 1.07 mmol/L — ABNORMAL LOW (ref 1.15–1.40)
HCT: 42 % (ref 36.0–46.0)
Hemoglobin: 14.3 g/dL (ref 12.0–15.0)
O2 Saturation: 79 %
Potassium: 4.5 mmol/L (ref 3.5–5.1)
Sodium: 137 mmol/L (ref 135–145)
TCO2: 29 mmol/L (ref 22–32)
pCO2, Ven: 40.3 mmHg — ABNORMAL LOW (ref 44.0–60.0)
pH, Ven: 7.445 — ABNORMAL HIGH (ref 7.250–7.430)
pO2, Ven: 42 mmHg (ref 32.0–45.0)

## 2019-07-14 NOTE — NC FL2 (Signed)
[place] MEDICAID FL2 LEVEL OF CARE SCREENING TOOL     IDENTIFICATION  Patient Name: Lindsey Rowe Birthdate: Dec 20, 1945 Sex: female Admission Date (Current Location): 07/12/2019  [facility] and [place] Number:  Haynes Bast [id number] Facility and Address:  [facility] [place], [address], [place], [place] [address]      Provider Number: [id number]  Attending Physician Name and Address:  Jadene Pierini, MD  Relative Name and Phone Number:  Jarvis Morgan, friend [phone] (home); [phone] (work)    Current Level of Care: Hospital Recommended Level of Care: Skilled Nursing Facility Prior Approval Number:    Date Approved/Denied:   PASRR Number: [id number]  Discharge Plan: SNF    Current Diagnoses: Patient Active Problem List   Diagnosis Date Noted  . Subdural hematoma (H[facility]) 06/28/2019  . COPD exacerbation ([facility]) 10/08/2018  . Asthmatic bronchitis 12/02/2017  . Acute respiratory failure with hypoxia ([facility]) 06/27/2017  . Diastolic dysfunction 06/27/2017  . Acute lower UTI 06/27/2017  . Acute metabolic encephalopathy 06/27/2017  . Altered mental state 06/10/2017  . Dyspnea and respiratory abnormalities 08/29/2016  . Hypoxemia 08/29/2016  . Shoulder dislocation, right, initial encounter 05/24/2016  . 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 Hyperglycemia 05/24/2016  . Anterior dislocation of right shoulder   . Syncope 03/24/2016  . Generalized weakness 09/24/2015  . Leg pain, bilateral 09/24/2015  . Depression 09/24/2015  . Loss of appetite 09/24/2015  . Leg weakness, bilateral   . Hypothyroid 12/28/2012  . Hypovolemia 12/28/2012  . Hypotension 08/13/2012  . Dehydration 08/13/2012  . AKI (acute kidney injury) ([facility]) 08/13/2012  . Hypoventilation associated with obesity syndrome ([facility]) 08/13/2012  . Acute respiratory failure ([facility]) 07/26/2012  . Chest pain 07/26/2012  . ANKLE SPRAIN, RIGHT 03/16/2010  . DIVERTICULOSIS, COLON 10/31/2009   . SKIN LESION 09/23/2009  . Shoulder pain, right 07/05/2009  . COUGH 03/29/2009  . Migraine 03/11/2009  . OTITIS MEDIA, RIGHT 12/13/2008  . BRUISE 12/06/2008  . ACID REFLUX DISEASE 09/13/2008  . ALLERGIC RHINITIS 05/20/2008  . UNSPECIFIED DISEASE OF HAIR AND HAIR FOLLICLES 05/20/2008  . DERMATITIS 11/12/2007  . OBESITY 07/18/2007  . HYPERCHOLESTEROLEMIA 05/13/2007  . BIPOLAR AFFECTIVE DISORDER 05/12/2007  . HYPERTENSION, BENIGN ESSENTIAL 05/12/2007  . Low back pain potentially associated with radiculopathy 05/12/2007    Orientation RESPIRATION BLADDER Height & Weight     Self, Time, Situation, Place  Normal Incontinent Weight: 99.8 kg Height:  5\' 3"  (160 cm)  BEHAVIORAL SYMPTOMS/MOOD NEUROLOGICAL BOWEL NUTRITION STATUS      Continent Diet(dysphagia 2 (fine chop) thin liquids)  AMBULATORY STATUS COMMUNICATION OF NEEDS Skin   Extensive Assist Verbally Surgical wounds(Burr holes site)                       Personal Care Assistance Level of Assistance  Bathing, Feeding, Dressing Bathing Assistance: Maximum assistance Feeding assistance: Limited assistance Dressing Assistance: Maximum assistance     Functional Limitations Info  Sight Sight Info: Impaired        SPECIAL CARE FACTORS FREQUENCY  PT (By licensed PT), OT (By licensed OT), Speech therapy     PT Frequency: 5 times weekly OT Frequency: 5 times weekly     Speech Therapy Frequency: 5 times weekly      Contractures Contractures Info: Not present    Additional Factors Info  Code Status, Allergies Code Status Info: DNR Allergies Info: Gabapentin- right foot and leg swelling; Cefadroxil-hair itching Psychotropic Info: Cymbalta 30mg  BID; Buspar 10mg  BID;  Ativan 0.5mg  BID prn         Current Medications (07/14/2019):  This is the current hospital active medication list Current Facility-Administered Medications  Medication Dose Route Frequency Provider Last Rate Last Admin  . acetaminophen (TYLENOL)  tablet 650 mg  650 mg Oral Q4H PRN Judith Part, MD       Or  . acetaminophen (TYLENOL) suppository 650 mg  650 mg Rectal Q4H PRN Judith Part, MD      . albuterol (PROVENTIL) (2.5 MG/3ML) 0.083% nebulizer solution 2.5 mg  2.5 mg Inhalation Q6H PRN Judith Part, MD      . busPIRone (BUSPAR) tablet 10 mg  10 mg Oral BID Judith Part, MD   10 mg at 07/14/19 1008  . Chlorhexidine Gluconate Cloth 2 % PADS 6 each  6 each Topical Daily Judith Part, MD   6 each at 07/14/19 1009  . cycloSPORINE (RESTASIS) 0.05 % ophthalmic emulsion 1 drop  1 drop Both Eyes BID Judith Part, MD   1 drop at 07/14/19 1006  . DULoxetine (CYMBALTA) DR capsule 30 mg  30 mg Oral BID Judith Part, MD   30 mg at 07/14/19 1008  . famotidine (PEPCID) tablet 20 mg  20 mg Oral BID Judith Part, MD   20 mg at 07/14/19 1007  . fluticasone (FLONASE) 50 MCG/ACT nasal spray 1 spray  1 spray Each Nare Daily PRN Judith Part, MD      . Derrill Memo ON 07/15/2019] heparin injection 5,000 Units  5,000 Units Subcutaneous Q8H Ostergard, Joyice Faster, MD      . HYDROcodone-acetaminophen (NORCO/VICODIN) 5-325 MG per tablet 1 tablet  1 tablet Oral Q4H PRN Judith Part, MD      . HYDROmorphone (DILAUDID) injection 0.5 mg  0.5 mg Intravenous Q3H PRN Judith Part, MD   0.5 mg at 07/13/19 0549  . labetalol (NORMODYNE) injection 10-40 mg  10-40 mg Intravenous Q10 min PRN Judith Part, MD      . levothyroxine (SYNTHROID) tablet 75 mcg  75 mcg Oral QAC breakfast Judith Part, MD   75 mcg at 07/14/19 0557  . lisinopril (ZESTRIL) tablet 10 mg  10 mg Oral Daily Judith Part, MD   Stopped at 07/14/19 1007  . loperamide (IMODIUM) capsule 2 mg  2 mg Oral TID PRN Judith Part, MD      . LORazepam (ATIVAN) tablet 0.5 mg  0.5 mg Oral BID PRN Judith Part, MD      . MEDLINE mouth rinse  15 mL Mouth Rinse BID Judith Part, MD   15 mL at 07/14/19 1014  . Muscle  Rub CREA   Topical BID Judith Part, MD   Given at 07/14/19 1010  . ondansetron (ZOFRAN) tablet 4 mg  4 mg Oral Q4H PRN Judith Part, MD       Or  . ondansetron (ZOFRAN) injection 4 mg  4 mg Intravenous Q4H PRN Judith Part, MD      . pantoprazole (PROTONIX) EC tablet 40 mg  40 mg Oral Daily Judith Part, MD   40 mg at 07/14/19 1007  . polyethylene glycol (MIRALAX / GLYCOLAX) packet 17 g  17 g Oral Daily PRN Judith Part, MD      . pravastatin (PRAVACHOL) tablet 40 mg  40 mg Oral QPM Judith Part, MD   40 mg at 07/13/19 1816  . promethazine (PHENERGAN) tablet 12.5-25  mg  12.5-25 mg Oral Q4H PRN Jadene Pierini, MD      . tiZANidine (ZANAFLEX) tablet 2 mg  2 mg Oral TID PRN Jadene Pierini, MD   2 mg at 07/13/19 2130     Discharge Medications: Please see discharge summary for a list of discharge medications.  Relevant Imaging Results:  Relevant Lab Results:   Additional Information SS# [id number]    Quintella Baton, RN, BSN  Trauma/Neuro ICU Case Manager [phone]

## 2019-07-14 NOTE — Progress Notes (Signed)
Neurosurgery Service Progress Note  Subjective: No acute events overnight, no complaints today  Objective: Vitals:   07/14/19 0700 07/14/19 0800 07/14/19 0900 07/14/19 1000  BP: 112/88 (!) 100/55 109/65 103/87  Pulse: 77 79 88 73  Resp: 19 20 17 19   Temp:  98.1 F (36.7 C)    TempSrc:  Oral    SpO2: 98% 99% 98% 93%   Temp (24hrs), Avg:98.3 F (36.8 C), Min:97.9 F (36.6 C), Max:99.1 F (37.3 C)  CBC Latest Ref Rng & Units 07/13/2019 07/12/2019 07/12/2019  WBC 4.0 - 10.5 K/uL 16.2(H) - -  Hemoglobin 12.0 - 15.0 g/dL 12.7 14.3 14.3  Hematocrit 36.0 - 46.0 % 40.3 42.0 42.0  Platelets 150 - 400 K/uL 329 - -   BMP Latest Ref Rng & Units 07/13/2019 07/12/2019 07/12/2019  Glucose 70 - 99 mg/dL - - -  BUN 8 - 23 mg/dL - - -  Creatinine 0.44 - 1.00 mg/dL 0.91 - -  Sodium 135 - 145 mmol/L - 137 137  Potassium 3.5 - 5.1 mmol/L - 4.5 4.5  Chloride 98 - 111 mmol/L - - -  CO2 22 - 32 mmol/L - - -  Calcium 8.9 - 10.3 mg/dL - - -    Intake/Output Summary (Last 24 hours) at 07/14/2019 1054 Last data filed at 07/14/2019 0800 Gross per 24 hour  Intake 503.06 ml  Output 400 ml  Net 103.06 ml    Current Facility-Administered Medications:  .  acetaminophen (TYLENOL) tablet 650 mg, 650 mg, Oral, Q4H PRN **OR** acetaminophen (TYLENOL) suppository 650 mg, 650 mg, Rectal, Q4H PRN, Giang Hemme A, MD .  albuterol (PROVENTIL) (2.5 MG/3ML) 0.083% nebulizer solution 2.5 mg, 2.5 mg, Inhalation, Q6H PRN, Judith Part, MD .  busPIRone (BUSPAR) tablet 10 mg, 10 mg, Oral, BID, Judith Part, MD, 10 mg at 07/14/19 1008 .  Chlorhexidine Gluconate Cloth 2 % PADS 6 each, 6 each, Topical, Daily, Judith Part, MD, 6 each at 07/14/19 1009 .  cycloSPORINE (RESTASIS) 0.05 % ophthalmic emulsion 1 drop, 1 drop, Both Eyes, BID, Lacrisha Bielicki, Joyice Faster, MD, 1 drop at 07/14/19 1006 .  DULoxetine (CYMBALTA) DR capsule 30 mg, 30 mg, Oral, BID, Jamecia Lerman, Joyice Faster, MD, 30 mg at 07/14/19 1008 .  famotidine  (PEPCID) tablet 20 mg, 20 mg, Oral, BID, Aldahir Litaker, Joyice Faster, MD, 20 mg at 07/14/19 1007 .  fluticasone (FLONASE) 50 MCG/ACT nasal spray 1 spray, 1 spray, Each Nare, Daily PRN, Judith Part, MD .  Derrill Memo ON 07/15/2019] heparin injection 5,000 Units, 5,000 Units, Subcutaneous, Q8H, Dennise Raabe A, MD .  HYDROcodone-acetaminophen (NORCO/VICODIN) 5-325 MG per tablet 1 tablet, 1 tablet, Oral, Q4H PRN, Judith Part, MD .  HYDROmorphone (DILAUDID) injection 0.5 mg, 0.5 mg, Intravenous, Q3H PRN, Judith Part, MD, 0.5 mg at 07/13/19 0549 .  labetalol (NORMODYNE) injection 10-40 mg, 10-40 mg, Intravenous, Q10 min PRN, Judith Part, MD .  levothyroxine (SYNTHROID) tablet 75 mcg, 75 mcg, Oral, QAC breakfast, Judith Part, MD, 75 mcg at 07/14/19 0557 .  lisinopril (ZESTRIL) tablet 10 mg, 10 mg, Oral, Daily, Judith Part, MD, Stopped at 07/14/19 1007 .  loperamide (IMODIUM) capsule 2 mg, 2 mg, Oral, TID PRN, Judith Part, MD .  LORazepam (ATIVAN) tablet 0.5 mg, 0.5 mg, Oral, BID PRN, Judith Part, MD .  MEDLINE mouth rinse, 15 mL, Mouth Rinse, BID, Serrita Lueth, Joyice Faster, MD, 15 mL at 07/14/19 1014 .  Muscle Rub CREA, , Topical, BID,  Jadene Pierini, MD, Given at 07/14/19 1010 .  ondansetron (ZOFRAN) tablet 4 mg, 4 mg, Oral, Q4H PRN **OR** ondansetron (ZOFRAN) injection 4 mg, 4 mg, Intravenous, Q4H PRN, Arihant Pennings A, MD .  pantoprazole (PROTONIX) EC tablet 40 mg, 40 mg, Oral, Daily, Jaycey Gens, Clovis Pu, MD, 40 mg at 07/14/19 1007 .  polyethylene glycol (MIRALAX / GLYCOLAX) packet 17 g, 17 g, Oral, Daily PRN, Beverly Suriano A, MD .  pravastatin (PRAVACHOL) tablet 40 mg, 40 mg, Oral, QPM, Nelton Amsden, Clovis Pu, MD, 40 mg at 07/13/19 1816 .  promethazine (PHENERGAN) tablet 12.5-25 mg, 12.5-25 mg, Oral, Q4H PRN, Jadene Pierini, MD .  tiZANidine (ZANAFLEX) tablet 2 mg, 2 mg, Oral, TID PRN, Jadene Pierini, MD, 2 mg at 07/13/19  2130   Physical Exam: AOx3, gaze conjugate, FS, strength diffusely 4+/5, SILTx4, incision c/d/i  Assessment & Plan: 74 y.o. woman with obtundation due to subacute expansion of subdural hematoma, 6/6 s/p burr hole evacuation of SDH, recovering well. 6/7 CTH with subtotal evacuation, improved mass effect / midline shift  -exam continues to improve -continue home meds -transfer to stepdown, SNF planning -SCDs/TEDs, start [facility] tomorrow (POD2 but went to the OR very late on 6/6)  Jadene Pierini  07/14/19 10:54 AM

## 2019-07-14 NOTE — Progress Notes (Signed)
Patient wheeled over from 4N ICU to 4NP05. Patient assessed, new sacral foam placed, floor mats placed, and vitals obtained. The patient bathed after having a urine and stool occurrence. Patient oriented to room, bed alarm set, and call bell placed within reach.

## 2019-07-14 NOTE — Progress Notes (Signed)
  Speech Language Pathology Treatment: Dysphagia  Patient Details Name: Lindsey Rowe MRN: [id number] DOB: 03-Jan-1946 Today's Date: 07/14/2019 Time: 3545-6256 SLP Time Calculation (min) (ACUTE ONLY): 12 min  Assessment / Plan / Recommendation Clinical Impression  Pt is more alert and conversant today. She is also initiating PO intake more, although she does not like the chopped foods. SLP provided skilled observation as pt ate advanced trials of regular solids with prolonged mastication in the setting of missing dentition. No cueing was needed for safety, although she did still need encouragement to eat.  Recommend advancing to Dys 3 diet and thin liquids.    HPI HPI: Pt is a 74 yo female presenting with progressive somnolence after recent admission for R sided SDH. CT Head showed increased size of SDH, also with increased midline shift and mass effect. S/p burr hole evacuation 6/6. BSE 06/29/19 WFL.  PMH includes: thyroid disease, HTN, HLD, GERD, depression, bipolar 1 d/o, back pain, arthritis      SLP Plan  Continue with current plan of care       Recommendations  Diet recommendations: Dysphagia 3 (mechanical soft);Thin liquid Liquids provided via: Cup;Straw Medication Administration: Whole meds with puree Supervision: Patient able to self feed;Intermittent supervision to cue for compensatory strategies Compensations: Minimize environmental distractions;Slow rate;Small sips/bites Postural Changes and/or Swallow Maneuvers: Seated upright 90 degrees                Oral Care Recommendations: Oral care BID Follow up Recommendations: Skilled Nursing facility SLP Visit Diagnosis: Dysphagia, unspecified (R13.10) Plan: Continue with current plan of care       GO                 Mahala Menghini., M.A. CCC-SLP Acute Rehabilitation Services Pager [phone] Office [phone]  07/14/2019, 3:07 PM

## 2019-07-14 NOTE — Discharge Summary (Signed)
Discharge Summary  Date of Admission: 07/12/2019  Date of Discharge: 07/16/19  Attending Physician: Autumn Patty, MD  Hospital Course: Patient was admitted from SNF with progressive obtundation. She previously had an acute subdural hematoma after a fall and this had expanded significantly in the interval with brain compression. She was therefore taken to the OR for an uncomplicated burr hole evacuation of her subdural hematoma. She was recovered in PACU and transferred to 4N ICU. Her exam continued to improve post-op and a post-op CTH showed evacuation of the subdural with improved midline shift, but still significant subdural blood present. She had significant wrist pain on the right, plain films of the entire right upper extremity were unremarkable. Her hospital course was uncomplicated and the patient was discharged back to SNF on 07/16/19. She will follow up in clinic with me in 2 weeks.  Neurologic exam at discharge:  AOx3, PERRL, EOMI, FS, TM Strength 4+/5 x4, SILTx4  Discharge diagnosis: Subdural hematoma  Allergies as of 07/16/2019      Reactions   Gabapentin Other (See Comments)   Right foot and leg swelled    Cefadroxil Itching   Ends of hair itched       Medication List    STOP taking these medications   levETIRAcetam 500 MG tablet Commonly known as: KEPPRA   pregabalin 100 MG capsule Commonly known as: LYRICA   SUMAtriptan 100 MG tablet Commonly known as: IMITREX     TAKE these medications   acetaminophen 325 MG tablet Commonly known as: TYLENOL Take 650 mg by mouth every 8 (eight) hours.   albuterol 108 (90 Base) MCG/ACT inhaler Commonly known as: VENTOLIN HFA Inhale 2 puffs into the lungs every 6 (six) hours as needed for wheezing or shortness of breath. What changed: Another medication with the same name was removed. Continue taking this medication, and follow the directions you see here.   Biofreeze 4 % Gel Generic drug: Menthol (Topical  Analgesic) Apply 1 application topically 2 (two) times daily. Apply to both legs   busPIRone 10 MG tablet Commonly known as: BUSPAR Take 10 mg by mouth 2 (two) times daily.   cycloSPORINE 0.05 % ophthalmic emulsion Commonly known as: RESTASIS Place 1 drop into both eyes 2 (two) times daily.   DULoxetine 30 MG capsule Commonly known as: CYMBALTA Take 1 capsule (30 mg total) by mouth 2 (two) times daily.   fluticasone 50 MCG/ACT nasal spray Commonly known as: FLONASE Place 1 spray into both nostrils daily as needed for allergies or rhinitis.   levothyroxine 75 MCG tablet Commonly known as: SYNTHROID Take 75 mcg by mouth daily before breakfast.   lisinopril 10 MG tablet Commonly known as: ZESTRIL Take 10 mg by mouth daily.   loperamide 2 MG capsule Commonly known as: IMODIUM Take 1 capsule (2 mg total) by mouth 3 (three) times daily as needed for diarrhea or loose stools.   LORazepam 0.5 MG tablet Commonly known as: ATIVAN Take 0.5 mg by mouth 2 (two) times daily as needed for anxiety.   omeprazole 20 MG capsule Commonly known as: PRILOSEC Take 20 mg by mouth 2 (two) times daily before a meal.   pravastatin 40 MG tablet Commonly known as: PRAVACHOL Take 40 mg by mouth every evening.   Prolia 60 MG/ML Sosy injection Generic drug: denosumab Inject 60 mg into the skin See admin instructions. BRING TO THE OFFICE TO BE GIVEN SUBCUTANEOUSLY AS DIRECTED FOR 6 MONTHS   tiZANidine 2 MG tablet Commonly known as:  ZANAFLEX Take 2 mg by mouth 3 (three) times daily as needed for muscle spasms.   vitamin B-12 1000 MCG tablet Commonly known as: CYANOCOBALAMIN Take 1,000 mcg by mouth daily.   Vitamin D (Ergocalciferol) 1.25 MG (50000 UNIT) Caps capsule Commonly known as: DRISDOL Take 50,000 Units by mouth every Wednesday.        Judith Part, MD 07/14/19 3:48 PM

## 2019-07-14 NOTE — TOC Initial Note (Signed)
Transition of Care [facility]) - Initial/Assessment Note    Patient Details  Name: Lindsey Rowe MRN: [id number] Date of Birth: 07/17/1945  Transition of Care [facility]) CM/SW Contact:    Ella Bodo, RN Phone Number: 07/14/2019, 2:56 PM  Clinical Narrative: 74 y.o. woman w/ prior R SDH, now w/ obtundation. CT shows large expansion of R SDH with 25m of midline shift.  -OR now for burr hole evacuation 07/11/2009. [facility].  PT/OT recommending return to SNF; [facility]  [facility]; left message for [facility] to facilitate return to facility.  She states there is no family that she would like contacted.                Expected Discharge Plan: Skilled Nursing Facility Barriers to Discharge: Continued Medical Work up   Patient Goals and CMS Choice   CMS Medicare.gov Compare Post Acute Care list provided to:: Patient Choice offered to / list presented to : Patient  Expected Discharge Plan and Services [place]: [place]  Discharge Planning Services: CM Consult Post Acute Care [place] arrangements for the past 2 months: Skilled Nursing Facility([facility])                                      Prior Living Arrangements/Services Living arrangements for the past 2 months: Skilled Nursing Facility([facility]) Lives with:: Facility Resident Patient language and need for interpreter reviewed:: Yes Do you feel safe going back to the place where you live?: Yes      Need for Family Participation in Patient Care: No (Comment) Care giver support system in place?: No (comment)   Criminal Activity/Legal Involvement Pertinent to Current Situation/Hospitalization: No - Comment as needed  Activities of Daily Living      Permission Sought/Granted   Permission granted to share information with : Yes,  Verbal Permission Granted     Permission granted to share info w [facility]        Emotional Assessment Appearance:: Appears stated age Attitude/Demeanor/Rapport: Engaged Affect (typically observed): Accepting Orientation: : Oriented to Self, Oriented to Place, Oriented to  Time, Oriented to Situation      Admission diagnosis:  Subdural hemorrhage ([place] [I62.00] Acute encephalopathy [G93.40] Leukocytosis, unspecified type [D72.829] Subdural hematoma ([place] [S06.5X9A] Patient Active Problem List   Diagnosis Date Noted  . Subdural hematoma ([place] 06/28/2019  . COPD exacerbation ([place] 10/08/2018  . Asthmatic bronchitis 12/02/2017  . Acute respiratory failure with hypoxia ([place] 06/27/2017  . Diastolic dysfunction 053/97/6734 . Acute lower UTI 06/27/2017  . Acute metabolic encephalopathy 019/37/9024 . Altered mental state 06/10/2017  . Dyspnea and respiratory abnormalities 08/29/2016  . Hypoxemia 08/29/2016  . Shoulder dislocation, right, initial encounter 05/24/2016  . 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 Hyperglycemia 05/24/2016  . Anterior dislocation of right shoulder   . Syncope 03/24/2016  . Generalized weakness 09/24/2015  . Leg pain, bilateral 09/24/2015  . Depression 09/24/2015  . Loss of appetite 09/24/2015  . Leg weakness, bilateral   . Hypothyroid 12/28/2012  . Hypovolemia 12/28/2012  . Hypotension 08/13/2012  . Dehydration 08/13/2012  . AKI (acute kidney injury) ([place] 08/13/2012  . Hypoventilation associated with obesity syndrome ([place] 08/13/2012  . Acute respiratory failure ([place]  07/26/2012  . Chest pain 07/26/2012  . ANKLE SPRAIN, RIGHT 03/16/2010  . DIVERTICULOSIS, COLON 10/31/2009  . SKIN LESION 09/23/2009  . Shoulder pain, right 07/05/2009  . COUGH 03/29/2009  . Migraine 03/11/2009  . OTITIS MEDIA, RIGHT 12/13/2008  . BRUISE 12/06/2008  . ACID REFLUX DISEASE 09/13/2008  . ALLERGIC RHINITIS 05/20/2008  . UNSPECIFIED DISEASE OF  HAIR AND HAIR FOLLICLES 57/32/2025  . DERMATITIS 11/12/2007  . OBESITY 07/18/2007  . HYPERCHOLESTEROLEMIA 05/13/2007  . BIPOLAR AFFECTIVE DISORDER 05/12/2007  . HYPERTENSION, BENIGN ESSENTIAL 05/12/2007  . Low back pain potentially associated with radiculopathy 05/12/2007   PCP:  Kelton Pillar, MD Pharmacy:  No Pharmacies Listed    Social Determinants of Health (SDOH) Interventions    Readmission Risk Interventions No flowsheet data found.  Reinaldo Raddle, RN, BSN  Trauma/Neuro ICU Case Manager [phone]

## 2019-07-15 ENCOUNTER — Ambulatory Visit: Payer: Self-pay | Admitting: Psychology

## 2019-07-15 NOTE — TOC Progression Note (Signed)
Transition of Care [facility]) - Progression Note    Patient Details  Name: Lindsey Rowe MRN: [id number] Date of Birth: 07-10-1945  Transition of Care [facility]) CM/SW Contact  Eduard Roux, [place] Phone Number: 07/15/2019, 12:41 PM  Clinical Narrative:     CSW sent message to [facility]- informed per MD patient is medically ready for discharge- SNF starting insurance authorization today.  Antony Blackbird, MSW, LCSWA Clinical Social Worker   Expected Discharge Plan: Skilled Nursing Facility Barriers to Discharge: Continued Medical Work up  Expected Discharge Plan and Services Expected Discharge Plan: Skilled Nursing Facility   Discharge Planning Services: CM Consult Post Acute Care Choice: Skilled Nursing Facility Living arrangements for the past 2 months: Skilled Nursing Facility([facility])                                       Social Determinants of Health (SDOH) Interventions    Readmission Risk Interventions No flowsheet data found.

## 2019-07-15 NOTE — Plan of Care (Signed)
  Problem: Education: Goal: Knowledge of General Education information will improve Description: Including pain rating scale, medication(s)/side effects and non-pharmacologic comfort measures Outcome: Not Progressing   Problem: Health Behavior/Discharge Planning: Goal: Ability to manage health-related needs will improve Outcome: Not Progressing  Patient dependent for ADL's, rolls eyes when asked orientation questions.

## 2019-07-15 NOTE — Progress Notes (Signed)
Neurosurgery Service Progress Note  Subjective: No acute events overnight, no complaints today  Objective: Vitals:   07/15/19 0320 07/15/19 0400 07/15/19 0500 07/15/19 0700  BP: (!) 117/58 99/62 111/66 113/68  Pulse: 89 88 88 87  Resp: 18 18 16 19   Temp: 98.2 F (36.8 C) 98 F (36.7 C)  98.2 F (36.8 C)  TempSrc: Oral Oral  Oral  SpO2: 99% (!) 89% 96% 96%  Weight:      Height:       Temp (24hrs), Avg:98.1 F (36.7 C), Min:97.7 F (36.5 C), Max:98.3 F (36.8 C)  CBC Latest Ref Rng & Units 07/13/2019 07/12/2019 07/12/2019  WBC 4.0 - 10.5 K/uL 16.2(H) - -  Hemoglobin 12.0 - 15.0 g/dL 12.7 14.3 14.3  Hematocrit 36.0 - 46.0 % 40.3 42.0 42.0  Platelets 150 - 400 K/uL 329 - -   BMP Latest Ref Rng & Units 07/13/2019 07/12/2019 07/12/2019  Glucose 70 - 99 mg/dL - - -  BUN 8 - 23 mg/dL - - -  Creatinine 0.44 - 1.00 mg/dL 0.91 - -  Sodium 135 - 145 mmol/L - 137 137  Potassium 3.5 - 5.1 mmol/L - 4.5 4.5  Chloride 98 - 111 mmol/L - - -  CO2 22 - 32 mmol/L - - -  Calcium 8.9 - 10.3 mg/dL - - -    Intake/Output Summary (Last 24 hours) at 07/15/2019 1045 Last data filed at 07/15/2019 0700 Gross per 24 hour  Intake 200 ml  Output 500 ml  Net -300 ml    Current Facility-Administered Medications:  .  acetaminophen (TYLENOL) tablet 650 mg, 650 mg, Oral, Q4H PRN, 650 mg at 07/14/19 1910 **OR** acetaminophen (TYLENOL) suppository 650 mg, 650 mg, Rectal, Q4H PRN, Allon Costlow A, MD .  albuterol (PROVENTIL) (2.5 MG/3ML) 0.083% nebulizer solution 2.5 mg, 2.5 mg, Inhalation, Q6H PRN, Judith Part, MD .  busPIRone (BUSPAR) tablet 10 mg, 10 mg, Oral, BID, Judith Part, MD, 10 mg at 07/15/19 1032 .  Chlorhexidine Gluconate Cloth 2 % PADS 6 each, 6 each, Topical, Daily, Judith Part, MD, 6 each at 07/14/19 1009 .  cycloSPORINE (RESTASIS) 0.05 % ophthalmic emulsion 1 drop, 1 drop, Both Eyes, BID, Jedidiah Demartini, Joyice Faster, MD, 1 drop at 07/15/19 1034 .  DULoxetine (CYMBALTA) DR capsule  30 mg, 30 mg, Oral, BID, Jams Trickett, Joyice Faster, MD, 30 mg at 07/15/19 1034 .  famotidine (PEPCID) tablet 20 mg, 20 mg, Oral, BID, Annjanette Wertenberger, Joyice Faster, MD, 20 mg at 07/15/19 1034 .  fluticasone (FLONASE) 50 MCG/ACT nasal spray 1 spray, 1 spray, Each Nare, Daily PRN, Judith Part, MD .  heparin injection 5,000 Units, 5,000 Units, Subcutaneous, Q8H, Judith Part, MD, 5,000 Units at 07/15/19 0506 .  HYDROcodone-acetaminophen (NORCO/VICODIN) 5-325 MG per tablet 1 tablet, 1 tablet, Oral, Q4H PRN, Judith Part, MD .  HYDROmorphone (DILAUDID) injection 0.5 mg, 0.5 mg, Intravenous, Q3H PRN, Judith Part, MD, 0.5 mg at 07/13/19 0549 .  labetalol (NORMODYNE) injection 10-40 mg, 10-40 mg, Intravenous, Q10 min PRN, Judith Part, MD .  levothyroxine (SYNTHROID) tablet 75 mcg, 75 mcg, Oral, QAC breakfast, Judith Part, MD, 75 mcg at 07/15/19 0506 .  lisinopril (ZESTRIL) tablet 10 mg, 10 mg, Oral, Daily, Karissa Meenan, Joyice Faster, MD, 10 mg at 07/15/19 1034 .  loperamide (IMODIUM) capsule 2 mg, 2 mg, Oral, TID PRN, Judith Part, MD .  LORazepam (ATIVAN) tablet 0.5 mg, 0.5 mg, Oral, BID PRN, Judith Part, MD .  MEDLINE mouth rinse, 15 mL, Mouth Rinse, BID, Dezerae Freiberger, Clovis Pu, MD, 15 mL at 07/15/19 1034 .  Muscle Rub CREA, , Topical, BID, Jadene Pierini, MD, Given at 07/15/19 1032 .  ondansetron (ZOFRAN) tablet 4 mg, 4 mg, Oral, Q4H PRN **OR** ondansetron (ZOFRAN) injection 4 mg, 4 mg, Intravenous, Q4H PRN, Odell Fasching A, MD .  pantoprazole (PROTONIX) EC tablet 40 mg, 40 mg, Oral, Daily, Marcelina Mclaurin, Clovis Pu, MD, 40 mg at 07/15/19 1034 .  polyethylene glycol (MIRALAX / GLYCOLAX) packet 17 g, 17 g, Oral, Daily PRN, Jodean Valade A, MD .  pravastatin (PRAVACHOL) tablet 40 mg, 40 mg, Oral, QPM, Reinhart Saulters, Clovis Pu, MD, 40 mg at 07/14/19 1820 .  promethazine (PHENERGAN) tablet 12.5-25 mg, 12.5-25 mg, Oral, Q4H PRN, Jadene Pierini, MD .  tiZANidine  (ZANAFLEX) tablet 2 mg, 2 mg, Oral, TID PRN, Jadene Pierini, MD, 2 mg at 07/13/19 2130   Physical Exam: AOx3, gaze conjugate, FS, strength diffusely 4+/5, SILTx4, incision c/d/i  Assessment & Plan: 74 y.o. woman with obtundation due to subacute expansion of subdural hematoma, 6/6 s/p burr hole evacuation of SDH, recovering well. 6/7 CTH with subtotal evacuation, improved mass effect / midline shift  -continue home meds -medically ready for discharge to SNF when accepted by facility -SCDs/TEDs, [facility]   Maisie Fus A Lyrik Dockstader  07/15/19 10:45 AM

## 2019-07-15 NOTE — Progress Notes (Signed)
Physical Therapy Treatment Patient Details Name: Lindsey Rowe MRN: [id number] DOB: Jul 02, 1945 Today's Date: 07/15/2019    History of Present Illness 74 y.o. woman w/ prior R aSDH, now w/ obtundation. CT shows large expansion of R SDH with 60mm of midline shift.  -OR now for burr hole evacuation 07/11/2009.PHMx: Bipoloar, depression back pain, arthritis    PT Comments    Patient progressing this session with sitting balance and able to attempt sit to stand.  Very rigid in trunk and extremities and with cevical extension, improved tone with cervical AAROM and reduced neck extension.  R LE more extended in supine and tight in adduction and L knee unable to fully extend and painful with ROM.  She will need SNF level rehab at d/c.  Still overall total A but progressing some this session.  Will continue to follow acutely.    Follow Up Recommendations  SNF     Equipment Recommendations  None recommended by PT    Recommendations for Other Services       Precautions / Restrictions Precautions Precautions: Fall Restrictions Weight Bearing Restrictions: No    Mobility  Bed Mobility Overal bed mobility: Needs Assistance Bed Mobility: Rolling;Sidelying to Sit;Sit to Supine Rolling: Max assist;+2 for physical assistance Sidelying to sit: Max assist;+2 for physical assistance   Sit to supine: +2 for physical assistance;Total assist   General bed mobility comments: increased time and multimodal cues for any activation during mobility, assisted for hygiene with rolling due to soiled with urine and BM in bed  Transfers Overall transfer level: Needs assistance   Transfers: Sit to/from Stand Sit to Stand: Total assist;+2 physical assistance         General transfer comment: attempted in stedy sit to stand after time taked to decrease tone and to help pt reach with UE's to stedy.  WIth +2 total A to come up into stedy with height of bed elevated, pt still not initiating forward weight  shift so returned to EOB.  Ambulation/Gait                 Stairs             Wheelchair Mobility    Modified Rankin (Stroke Patients Only)       Balance Overall balance assessment: Needs assistance Sitting-balance support: Feet supported;Bilateral upper extremity supported Sitting balance-Leahy Scale: Poor Sitting balance - Comments: intially needed mod A for balance, progressed to S during session EOB for about 10 minutes while assessing BP, getting stedy and attempting sit to stand                                    Cognition Arousal/Alertness: Awake/alert Behavior During Therapy: Flat affect Overall Cognitive Status: No family/caregiver present to determine baseline cognitive functioning Area of Impairment: Attention;Following commands;Safety/judgement;Problem solving                   Current Attention Level: Sustained Memory: Decreased short-term memory Following Commands: Follows one step commands inconsistently;Follows one step commands with increased time Safety/Judgement: Decreased awareness of safety;Decreased awareness of deficits   Problem Solving: Slow processing;Decreased initiation;Requires verbal cues;Requires tactile cues General Comments: will answer some questions briskly, but most need repeat and extra time, cue to call her to attention, she follows commands for LE therex in bed with multimodal cues and A      Exercises Other Exercises Other Exercises: AAROM x 4  extremitites x 5 reps for shoulder and elbow flex/ext, hip and knee flex/ext    General Comments General comments (skin integrity, edema, etc.): VSS in sitting EOB      Pertinent Vitals/Pain Pain Assessment: Faces Faces Pain Scale: Hurts even more Pain Location: both legs especially L knee AAROM Pain Descriptors / Indicators: Grimacing;Guarding;Moaning Pain Intervention(s): Monitored during session;Limited activity within patient's tolerance;Repositioned     Home Living                      Prior Function            PT Goals (current goals can now be found in the care plan section) Progress towards PT goals: Progressing toward goals    Frequency    Min 3X/week      PT Plan Current plan remains appropriate    Co-evaluation              AM-PAC PT "6 Clicks" Mobility   Outcome Measure  Help needed turning from your back to your side while in a flat bed without using bedrails?: Total Help needed moving from lying on your back to sitting on the side of a flat bed without using bedrails?: Total Help needed moving to and from a bed to a chair (including a wheelchair)?: Total Help needed standing up from a chair using your arms (e.g., wheelchair or bedside chair)?: Total Help needed to walk in hospital room?: Total Help needed climbing 3-5 steps with a railing? : Total 6 Click Score: 6    End of Session Equipment Utilized During Treatment: Gait belt Activity Tolerance: Patient limited by fatigue Patient left: in bed;with call bell/phone within reach   PT Visit Diagnosis: Muscle weakness (generalized) (M62.81);Other abnormalities of gait and mobility (R26.89);Other symptoms and signs involving the nervous system (R29.898)     Time: 6606-3016 PT Time Calculation (min) (ACUTE ONLY): 40 min  Charges:  $Therapeutic Exercise: 8-22 mins $Therapeutic Activity: 23-37 mins                     Magda Kiel, PT Acute Rehabilitation Services Pager:[phone] [phone] 07/15/2019    Reginia Naas 07/15/2019, 1:10 PM

## 2019-07-15 NOTE — Plan of Care (Signed)
  Problem: Coping: Goal: Level of anxiety will decrease Outcome: Not Progressing  Patient fearful, startling when Staff tries to provide care.

## 2019-07-16 DIAGNOSIS — M25561 Pain in right knee: Secondary | ICD-10-CM | POA: Diagnosis not present

## 2019-07-16 DIAGNOSIS — Z743 Need for continuous supervision: Secondary | ICD-10-CM | POA: Diagnosis not present

## 2019-07-16 DIAGNOSIS — G8929 Other chronic pain: Secondary | ICD-10-CM | POA: Diagnosis not present

## 2019-07-16 DIAGNOSIS — M25641 Stiffness of right hand, not elsewhere classified: Secondary | ICD-10-CM | POA: Diagnosis not present

## 2019-07-16 DIAGNOSIS — R1319 Other dysphagia: Secondary | ICD-10-CM | POA: Diagnosis not present

## 2019-07-16 DIAGNOSIS — M25511 Pain in right shoulder: Secondary | ICD-10-CM | POA: Diagnosis not present

## 2019-07-16 DIAGNOSIS — F419 Anxiety disorder, unspecified: Secondary | ICD-10-CM | POA: Diagnosis not present

## 2019-07-16 DIAGNOSIS — I10 Essential (primary) hypertension: Secondary | ICD-10-CM | POA: Diagnosis not present

## 2019-07-16 DIAGNOSIS — H04123 Dry eye syndrome of bilateral lacrimal glands: Secondary | ICD-10-CM | POA: Diagnosis not present

## 2019-07-16 DIAGNOSIS — M79641 Pain in right hand: Secondary | ICD-10-CM | POA: Diagnosis not present

## 2019-07-16 DIAGNOSIS — R1311 Dysphagia, oral phase: Secondary | ICD-10-CM | POA: Diagnosis not present

## 2019-07-16 DIAGNOSIS — J189 Pneumonia, unspecified organism: Secondary | ICD-10-CM | POA: Diagnosis not present

## 2019-07-16 DIAGNOSIS — S065X0D Traumatic subdural hemorrhage without loss of consciousness, subsequent encounter: Secondary | ICD-10-CM | POA: Diagnosis not present

## 2019-07-16 DIAGNOSIS — I6200 Nontraumatic subdural hemorrhage, unspecified: Secondary | ICD-10-CM | POA: Diagnosis not present

## 2019-07-16 DIAGNOSIS — D72829 Elevated white blood cell count, unspecified: Secondary | ICD-10-CM | POA: Diagnosis not present

## 2019-07-16 DIAGNOSIS — Z20828 Contact with and (suspected) exposure to other viral communicable diseases: Secondary | ICD-10-CM | POA: Diagnosis not present

## 2019-07-16 DIAGNOSIS — M6259 Muscle wasting and atrophy, not elsewhere classified, multiple sites: Secondary | ICD-10-CM | POA: Diagnosis not present

## 2019-07-16 DIAGNOSIS — R5381 Other malaise: Secondary | ICD-10-CM | POA: Diagnosis not present

## 2019-07-16 DIAGNOSIS — D649 Anemia, unspecified: Secondary | ICD-10-CM | POA: Diagnosis not present

## 2019-07-16 DIAGNOSIS — J449 Chronic obstructive pulmonary disease, unspecified: Secondary | ICD-10-CM | POA: Diagnosis not present

## 2019-07-16 DIAGNOSIS — U071 COVID-19: Secondary | ICD-10-CM | POA: Diagnosis not present

## 2019-07-16 DIAGNOSIS — R41841 Cognitive communication deficit: Secondary | ICD-10-CM | POA: Diagnosis not present

## 2019-07-16 DIAGNOSIS — K5900 Constipation, unspecified: Secondary | ICD-10-CM | POA: Diagnosis not present

## 2019-07-16 DIAGNOSIS — Z79899 Other long term (current) drug therapy: Secondary | ICD-10-CM | POA: Diagnosis not present

## 2019-07-16 DIAGNOSIS — E785 Hyperlipidemia, unspecified: Secondary | ICD-10-CM | POA: Diagnosis not present

## 2019-07-16 DIAGNOSIS — Z20822 Contact with and (suspected) exposure to covid-19: Secondary | ICD-10-CM | POA: Diagnosis not present

## 2019-07-16 DIAGNOSIS — J45909 Unspecified asthma, uncomplicated: Secondary | ICD-10-CM | POA: Diagnosis not present

## 2019-07-16 DIAGNOSIS — M25611 Stiffness of right shoulder, not elsewhere classified: Secondary | ICD-10-CM | POA: Diagnosis not present

## 2019-07-16 DIAGNOSIS — R55 Syncope and collapse: Secondary | ICD-10-CM | POA: Diagnosis not present

## 2019-07-16 DIAGNOSIS — J9611 Chronic respiratory failure with hypoxia: Secondary | ICD-10-CM | POA: Diagnosis not present

## 2019-07-16 DIAGNOSIS — R0989 Other specified symptoms and signs involving the circulatory and respiratory systems: Secondary | ICD-10-CM | POA: Diagnosis not present

## 2019-07-16 DIAGNOSIS — M6281 Muscle weakness (generalized): Secondary | ICD-10-CM | POA: Diagnosis not present

## 2019-07-16 DIAGNOSIS — R4182 Altered mental status, unspecified: Secondary | ICD-10-CM | POA: Diagnosis not present

## 2019-07-16 DIAGNOSIS — Z09 Encounter for follow-up examination after completed treatment for conditions other than malignant neoplasm: Secondary | ICD-10-CM | POA: Diagnosis not present

## 2019-07-16 DIAGNOSIS — S6991XA Unspecified injury of right wrist, hand and finger(s), initial encounter: Secondary | ICD-10-CM | POA: Diagnosis not present

## 2019-07-16 DIAGNOSIS — R0981 Nasal congestion: Secondary | ICD-10-CM | POA: Diagnosis not present

## 2019-07-16 DIAGNOSIS — R410 Disorientation, unspecified: Secondary | ICD-10-CM | POA: Diagnosis not present

## 2019-07-16 DIAGNOSIS — I959 Hypotension, unspecified: Secondary | ICD-10-CM | POA: Diagnosis not present

## 2019-07-16 DIAGNOSIS — N390 Urinary tract infection, site not specified: Secondary | ICD-10-CM | POA: Diagnosis not present

## 2019-07-16 DIAGNOSIS — M25562 Pain in left knee: Secondary | ICD-10-CM | POA: Diagnosis not present

## 2019-07-16 DIAGNOSIS — R0902 Hypoxemia: Secondary | ICD-10-CM | POA: Diagnosis not present

## 2019-07-16 DIAGNOSIS — J9601 Acute respiratory failure with hypoxia: Secondary | ICD-10-CM | POA: Diagnosis not present

## 2019-07-16 DIAGNOSIS — Z01812 Encounter for preprocedural laboratory examination: Secondary | ICD-10-CM | POA: Diagnosis not present

## 2019-07-16 DIAGNOSIS — Z03818 Encounter for observation for suspected exposure to other biological agents ruled out: Secondary | ICD-10-CM | POA: Diagnosis not present

## 2019-07-16 DIAGNOSIS — Z741 Need for assistance with personal care: Secondary | ICD-10-CM | POA: Diagnosis not present

## 2019-07-16 DIAGNOSIS — R279 Unspecified lack of coordination: Secondary | ICD-10-CM | POA: Diagnosis not present

## 2019-07-16 DIAGNOSIS — R519 Headache, unspecified: Secondary | ICD-10-CM | POA: Diagnosis not present

## 2019-07-16 DIAGNOSIS — R449 Unspecified symptoms and signs involving general sensations and perceptions: Secondary | ICD-10-CM | POA: Diagnosis not present

## 2019-07-16 NOTE — Progress Notes (Signed)
Neurosurgery Service Progress Note  Subjective: No acute events overnight, no complaints today  Objective: Vitals:   07/15/19 2003 07/16/19 0006 07/16/19 0349 07/16/19 0753  BP:  120/67 123/81 124/71  Pulse: 100 (!) 104 89 86  Resp: 20 17 20 20   Temp:  97.8 F (36.6 C) 98.2 F (36.8 C) 98.2 F (36.8 C)  TempSrc:  Oral Oral Oral  SpO2: 94% 96% 94% 97%  Weight:      Height:       Temp (24hrs), Avg:98.1 F (36.7 C), Min:97.8 F (36.6 C), Max:98.4 F (36.9 C)  CBC Latest Ref Rng & Units 07/13/2019 07/12/2019 07/12/2019  WBC 4.0 - 10.5 K/uL 16.2(H) - -  Hemoglobin 12.0 - 15.0 g/dL 09/11/2019 16.0 10.9  Hematocrit 36 - 46 % 40.3 42.0 42.0  Platelets 150 - 400 K/uL 329 - -   BMP Latest Ref Rng & Units 07/13/2019 07/12/2019 07/12/2019  Glucose 70 - 99 mg/dL - - -  BUN 8 - 23 mg/dL - - -  Creatinine 09/11/2019 - 1.00 mg/dL 5.57 - -  Sodium 3.22 - 145 mmol/L - 137 137  Potassium 3.5 - 5.1 mmol/L - 4.5 4.5  Chloride 98 - 111 mmol/L - - -  CO2 22 - 32 mmol/L - - -  Calcium 8.9 - 10.3 mg/dL - - -    Intake/Output Summary (Last 24 hours) at 07/16/2019 0805 Last data filed at 07/16/2019 09/15/2019 Gross per 24 hour  Intake 417 ml  Output 800 ml  Net -383 ml    Current Facility-Administered Medications:    acetaminophen (TYLENOL) tablet 650 mg, 650 mg, Oral, Q4H PRN, 650 mg at 07/14/19 1910 **OR** acetaminophen (TYLENOL) suppository 650 mg, 650 mg, Rectal, Q4H PRN, Minh Roanhorse A, MD   albuterol (PROVENTIL) (2.5 MG/3ML) 0.083% nebulizer solution 2.5 mg, 2.5 mg, Inhalation, Q6H PRN, 09/13/19, MD   busPIRone (BUSPAR) tablet 10 mg, 10 mg, Oral, BID, Eric Nees, Jadene Pierini, MD, 10 mg at 07/15/19 2143   Chlorhexidine Gluconate Cloth 2 % PADS 6 each, 6 each, Topical, Daily, 2144, MD, 6 each at 07/15/19 1844   cycloSPORINE (RESTASIS) 0.05 % ophthalmic emulsion 1 drop, 1 drop, Both Eyes, BID, Tashi Band A, MD, 1 drop at 07/15/19 2214   DULoxetine (CYMBALTA) DR capsule 30 mg, 30 mg,  Oral, BID, 2215, MD, 30 mg at 07/15/19 2143   famotidine (PEPCID) tablet 20 mg, 20 mg, Oral, BID, Laymon Stockert A, MD, 20 mg at 07/15/19 2143   fluticasone (FLONASE) 50 MCG/ACT nasal spray 1 spray, 1 spray, Each Nare, Daily PRN, 2144, MD   heparin injection 5,000 Units, 5,000 Units, Subcutaneous, Q8H, Ameen Mostafa, Jadene Pierini, MD, 5,000 Units at 07/16/19 0549   HYDROcodone-acetaminophen (NORCO/VICODIN) 5-325 MG per tablet 1 tablet, 1 tablet, Oral, Q4H PRN, 09/15/19, MD, 1 tablet at 07/16/19 0011   HYDROmorphone (DILAUDID) injection 0.5 mg, 0.5 mg, Intravenous, Q3H PRN, 09/15/19, MD, 0.5 mg at 07/13/19 0549   labetalol (NORMODYNE) injection 10-40 mg, 10-40 mg, Intravenous, Q10 min PRN, 07-28-1996, MD   levothyroxine (SYNTHROID) tablet 75 mcg, 75 mcg, Oral, QAC breakfast, Jadene Pierini, MD, 75 mcg at 07/16/19 0549   lisinopril (ZESTRIL) tablet 10 mg, 10 mg, Oral, Daily, Azir Muzyka A, MD, 10 mg at 07/15/19 1034   loperamide (IMODIUM) capsule 2 mg, 2 mg, Oral, TID PRN, 09/14/19, MD   LORazepam (ATIVAN) tablet 0.5 mg, 0.5 mg, Oral, BID PRN,  Judith Part, MD   MEDLINE mouth rinse, 15 mL, Mouth Rinse, BID, Olanda Boughner, Joyice Faster, MD, 15 mL at 07/15/19 2145   Muscle Rub CREA, , Topical, BID, Judith Part, MD, Given at 07/15/19 2144   ondansetron (ZOFRAN) tablet 4 mg, 4 mg, Oral, Q4H PRN **OR** ondansetron (ZOFRAN) injection 4 mg, 4 mg, Intravenous, Q4H PRN, Dontavis Tschantz, Joyice Faster, MD   pantoprazole (PROTONIX) EC tablet 40 mg, 40 mg, Oral, Daily, Knute Mazzuca A, MD, 40 mg at 07/15/19 1034   polyethylene glycol (MIRALAX / GLYCOLAX) packet 17 g, 17 g, Oral, Daily PRN, Judith Part, MD   pravastatin (PRAVACHOL) tablet 40 mg, 40 mg, Oral, QPM, Keisean Skowron A, MD, 40 mg at 07/15/19 1844   promethazine (PHENERGAN) tablet 12.5-25 mg, 12.5-25 mg, Oral, Q4H PRN, Judith Part, MD, 25 mg at 07/15/19  1850   tiZANidine (ZANAFLEX) tablet 2 mg, 2 mg, Oral, TID PRN, Judith Part, MD, 2 mg at 07/15/19 2212   Physical Exam: AOx3, gaze conjugate, FS, strength diffusely 4+/5, SILTx4, incision c/d/i  Assessment & Plan: 74 y.o. woman with obtundation due to subacute expansion of subdural hematoma, 6/6 s/p burr hole evacuation of SDH, recovering well. 6/7 CTH with subtotal evacuation, improved mass effect / midline shift  -continue home meds -medically ready for discharge to SNF when accepted by facility -SCDs/TEDs, [facility]   Marcello Moores A Arva Slaugh  07/16/19 8:05 AM

## 2019-07-16 NOTE — TOC Transition Note (Signed)
Transition of Care [facility]) - CM/SW Discharge Note   Patient Details  Name: RHIANNAN KIEVIT MRN: [id number] Date of Birth: [birth year]  Transition of Care [facility]) CM/SW Contact:  Eduard Roux, LCSWA Phone Number: 07/16/2019, 2:11 PM   Clinical Narrative:     Patient will DC to: [facility] DC Date:07/16/2019 Family Notified: called left voice message w/ emergency contact person/friend [name]  Transport By: Sharin Mons  Per MD patient is ready for discharge. RN, patient, and facility notified of DC. Discharge Summary sent to facility. RN given number for [phone]. Ambulance transport requested for patient.   Clinical Social Worker signing off.  Antony Blackbird, MSW, LCSWA Clinical Social Worker    Final next level of care: Skilled Nursing Facility Barriers to Discharge: Barriers Resolved   Patient Goals and CMS Choice   CMS Medicare.gov Compare Post Acute Care list provided to:: Patient Choice offered to / list presented to : Patient  Discharge Placement PASRR number recieved: 07/14/19            Patient chooses bed at: [facility] Patient to be transferred to facility by: [facility] Name of family member notified: called friend,[name]- left voice message - Patient and family notified of of transfer: 07/16/19  Discharge Plan and Services   Discharge Planning Services: CM Consult Post Acute Care Choice: Skilled Nursing Facility                               Social Determinants of Health (SDOH) Interventions     Readmission Risk Interventions No flowsheet data found.

## 2019-07-16 NOTE — Progress Notes (Signed)
Pt discharged to back to [facility]. Report was called to [place], RN, pt belongings packed, and PIV removed. Pt escorted out by transport.

## 2019-07-16 NOTE — Discharge Instructions (Signed)
Discharge Instructions  No restriction in activities, slowly increase your activity back to normal.   Your incision is closed with absorbable sutures. These will naturally fall off over the next 4-6 weeks. If they become bothersome or cause discomfort, apply some antibiotic ointment like bacitracin or neosporin on the sutures. This will soften them up and usually makes them more comfortable while they dissolve.  Okay to shower on the day of discharge. Be gentle when cleaning your incision. Use regular soap and water. If that is uncomfortable, try using baby shampoo. Do not submerge the wound under water for 2 weeks after surgery.  Follow up with Dr. Tamika Nou in 2 weeks after discharge. If you do not already have a discharge appointment, please call his office at [phone] to schedule a follow up appointment. If you have any concerns or questions, please call the office and let us know. 

## 2019-07-17 DIAGNOSIS — S065X0D Traumatic subdural hemorrhage without loss of consciousness, subsequent encounter: Secondary | ICD-10-CM | POA: Diagnosis not present

## 2019-07-17 DIAGNOSIS — S6991XA Unspecified injury of right wrist, hand and finger(s), initial encounter: Secondary | ICD-10-CM | POA: Diagnosis not present

## 2019-07-17 LAB — CULTURE, BLOOD (ROUTINE X 2)
Culture: NO GROWTH
Special Requests: ADEQUATE

## 2019-07-18 LAB — CULTURE, BLOOD (ROUTINE X 2): Culture: NO GROWTH

## 2019-07-20 DIAGNOSIS — E785 Hyperlipidemia, unspecified: Secondary | ICD-10-CM | POA: Diagnosis not present

## 2019-07-20 DIAGNOSIS — D72829 Elevated white blood cell count, unspecified: Secondary | ICD-10-CM | POA: Diagnosis not present

## 2019-07-20 DIAGNOSIS — S065X0D Traumatic subdural hemorrhage without loss of consciousness, subsequent encounter: Secondary | ICD-10-CM | POA: Diagnosis not present

## 2019-07-20 DIAGNOSIS — I10 Essential (primary) hypertension: Secondary | ICD-10-CM | POA: Diagnosis not present

## 2019-07-21 DIAGNOSIS — I959 Hypotension, unspecified: Secondary | ICD-10-CM | POA: Diagnosis not present

## 2019-07-21 DIAGNOSIS — S065X0D Traumatic subdural hemorrhage without loss of consciousness, subsequent encounter: Secondary | ICD-10-CM | POA: Diagnosis not present

## 2019-07-21 DIAGNOSIS — R449 Unspecified symptoms and signs involving general sensations and perceptions: Secondary | ICD-10-CM | POA: Diagnosis not present

## 2019-07-22 DIAGNOSIS — D649 Anemia, unspecified: Secondary | ICD-10-CM | POA: Diagnosis not present

## 2019-07-22 DIAGNOSIS — R449 Unspecified symptoms and signs involving general sensations and perceptions: Secondary | ICD-10-CM | POA: Diagnosis not present

## 2019-07-22 DIAGNOSIS — I10 Essential (primary) hypertension: Secondary | ICD-10-CM | POA: Diagnosis not present

## 2019-07-22 DIAGNOSIS — I959 Hypotension, unspecified: Secondary | ICD-10-CM | POA: Diagnosis not present

## 2019-07-23 DIAGNOSIS — F419 Anxiety disorder, unspecified: Secondary | ICD-10-CM | POA: Diagnosis not present

## 2019-07-24 DIAGNOSIS — I959 Hypotension, unspecified: Secondary | ICD-10-CM | POA: Diagnosis not present

## 2019-07-24 DIAGNOSIS — F419 Anxiety disorder, unspecified: Secondary | ICD-10-CM | POA: Diagnosis not present

## 2019-07-24 DIAGNOSIS — S065X0D Traumatic subdural hemorrhage without loss of consciousness, subsequent encounter: Secondary | ICD-10-CM | POA: Diagnosis not present

## 2019-07-27 ENCOUNTER — Ambulatory Visit (INDEPENDENT_AMBULATORY_CARE_PROVIDER_SITE_OTHER): Payer: Medicare HMO | Admitting: *Deleted

## 2019-07-27 DIAGNOSIS — R55 Syncope and collapse: Secondary | ICD-10-CM

## 2019-07-27 LAB — CUP PACEART REMOTE DEVICE CHECK
Date Time Interrogation Session: 20210621004842
Implantable Pulse Generator Implant Date: 20180420

## 2019-07-28 NOTE — Progress Notes (Signed)
Carelink Summary Report / Loop Recorder 

## 2019-07-29 ENCOUNTER — Ambulatory Visit: Payer: Medicare HMO | Admitting: Psychology

## 2019-07-29 DIAGNOSIS — R0981 Nasal congestion: Secondary | ICD-10-CM | POA: Diagnosis not present

## 2019-07-29 DIAGNOSIS — I959 Hypotension, unspecified: Secondary | ICD-10-CM | POA: Diagnosis not present

## 2019-07-29 DIAGNOSIS — N390 Urinary tract infection, site not specified: Secondary | ICD-10-CM | POA: Diagnosis not present

## 2019-07-30 DIAGNOSIS — I959 Hypotension, unspecified: Secondary | ICD-10-CM | POA: Diagnosis not present

## 2019-07-30 DIAGNOSIS — K5900 Constipation, unspecified: Secondary | ICD-10-CM | POA: Diagnosis not present

## 2019-07-30 DIAGNOSIS — N390 Urinary tract infection, site not specified: Secondary | ICD-10-CM | POA: Diagnosis not present

## 2019-07-30 DIAGNOSIS — S065X0D Traumatic subdural hemorrhage without loss of consciousness, subsequent encounter: Secondary | ICD-10-CM | POA: Diagnosis not present

## 2019-08-11 DIAGNOSIS — M25562 Pain in left knee: Secondary | ICD-10-CM | POA: Diagnosis not present

## 2019-08-11 DIAGNOSIS — S065X0D Traumatic subdural hemorrhage without loss of consciousness, subsequent encounter: Secondary | ICD-10-CM | POA: Diagnosis not present

## 2019-08-11 DIAGNOSIS — J449 Chronic obstructive pulmonary disease, unspecified: Secondary | ICD-10-CM | POA: Diagnosis not present

## 2019-08-11 DIAGNOSIS — F419 Anxiety disorder, unspecified: Secondary | ICD-10-CM | POA: Diagnosis not present

## 2019-08-11 DIAGNOSIS — M25561 Pain in right knee: Secondary | ICD-10-CM | POA: Diagnosis not present

## 2019-08-11 DIAGNOSIS — M25511 Pain in right shoulder: Secondary | ICD-10-CM | POA: Diagnosis not present

## 2019-08-11 DIAGNOSIS — G8929 Other chronic pain: Secondary | ICD-10-CM | POA: Diagnosis not present

## 2019-08-11 DIAGNOSIS — I10 Essential (primary) hypertension: Secondary | ICD-10-CM | POA: Diagnosis not present

## 2019-08-12 DIAGNOSIS — J9611 Chronic respiratory failure with hypoxia: Secondary | ICD-10-CM | POA: Diagnosis not present

## 2019-08-12 DIAGNOSIS — M25511 Pain in right shoulder: Secondary | ICD-10-CM | POA: Diagnosis not present

## 2019-08-12 DIAGNOSIS — R519 Headache, unspecified: Secondary | ICD-10-CM | POA: Diagnosis not present

## 2019-08-12 DIAGNOSIS — H04123 Dry eye syndrome of bilateral lacrimal glands: Secondary | ICD-10-CM | POA: Diagnosis not present

## 2019-08-14 ENCOUNTER — Other Ambulatory Visit (HOSPITAL_COMMUNITY)
Admission: RE | Admit: 2019-08-14 | Discharge: 2019-08-14 | Disposition: A | Discharge status: Home / Self Care | Payer: Medicare Other | Source: Ambulatory Visit | Attending: Adult Health | Admitting: Adult Health

## 2019-08-14 DIAGNOSIS — Z20822 Contact with and (suspected) exposure to covid-19: Secondary | ICD-10-CM | POA: Insufficient documentation

## 2019-08-14 DIAGNOSIS — Z01812 Encounter for preprocedural laboratory examination: Secondary | ICD-10-CM | POA: Insufficient documentation

## 2019-08-14 LAB — SARS CORONAVIRUS 2 (TAT 6-24 HRS): SARS Coronavirus 2: NEGATIVE

## 2019-08-14 IMAGING — DX DG CHEST 2V
2 series · 2 of 2 positions shown · non-contrast
Comparison: Chest CT dated 05/01/2017

CLINICAL DATA: 71-year-old female with panic episodes and
hallucination.

EXAM:
CHEST - 2 VIEW

[chest lat]
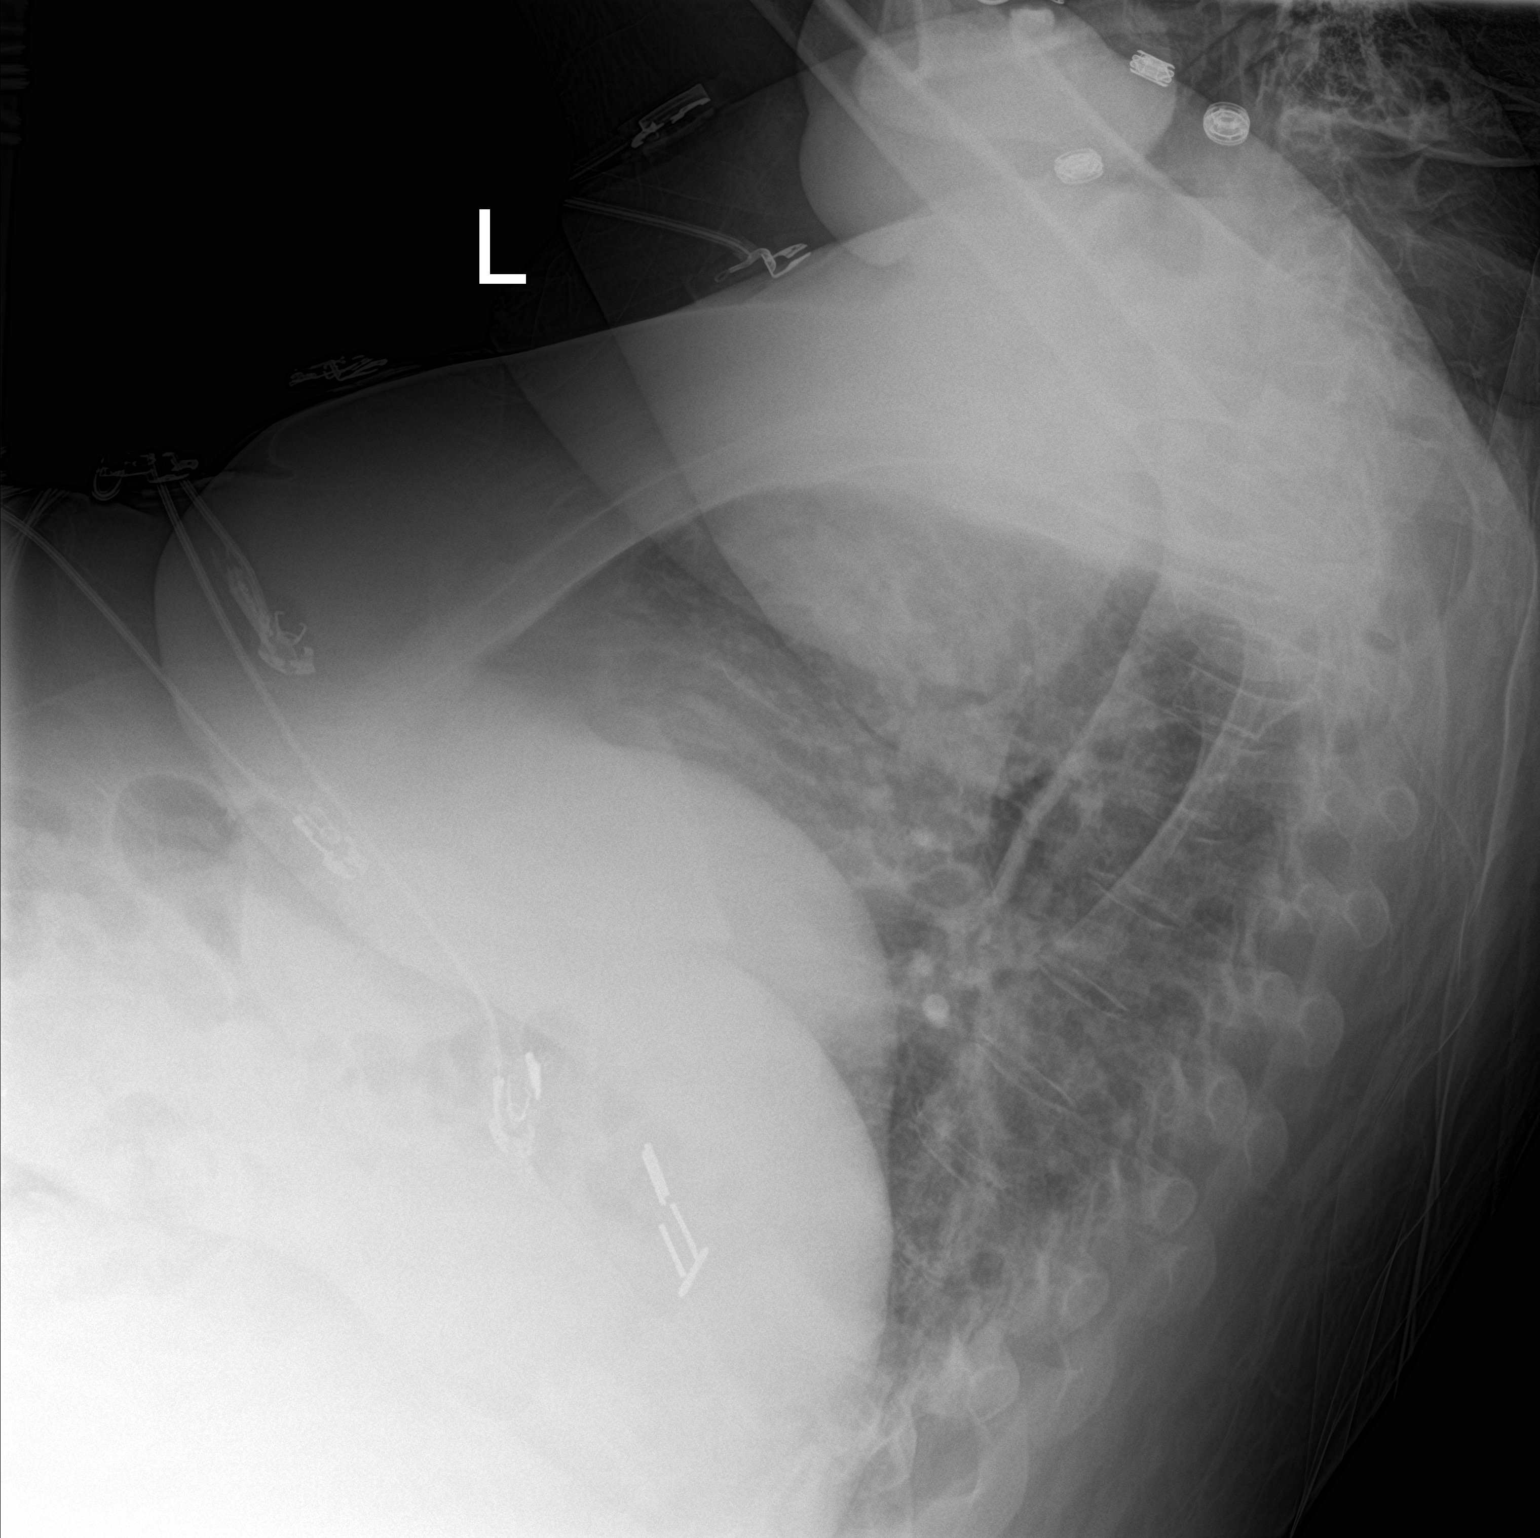

[chest ap]
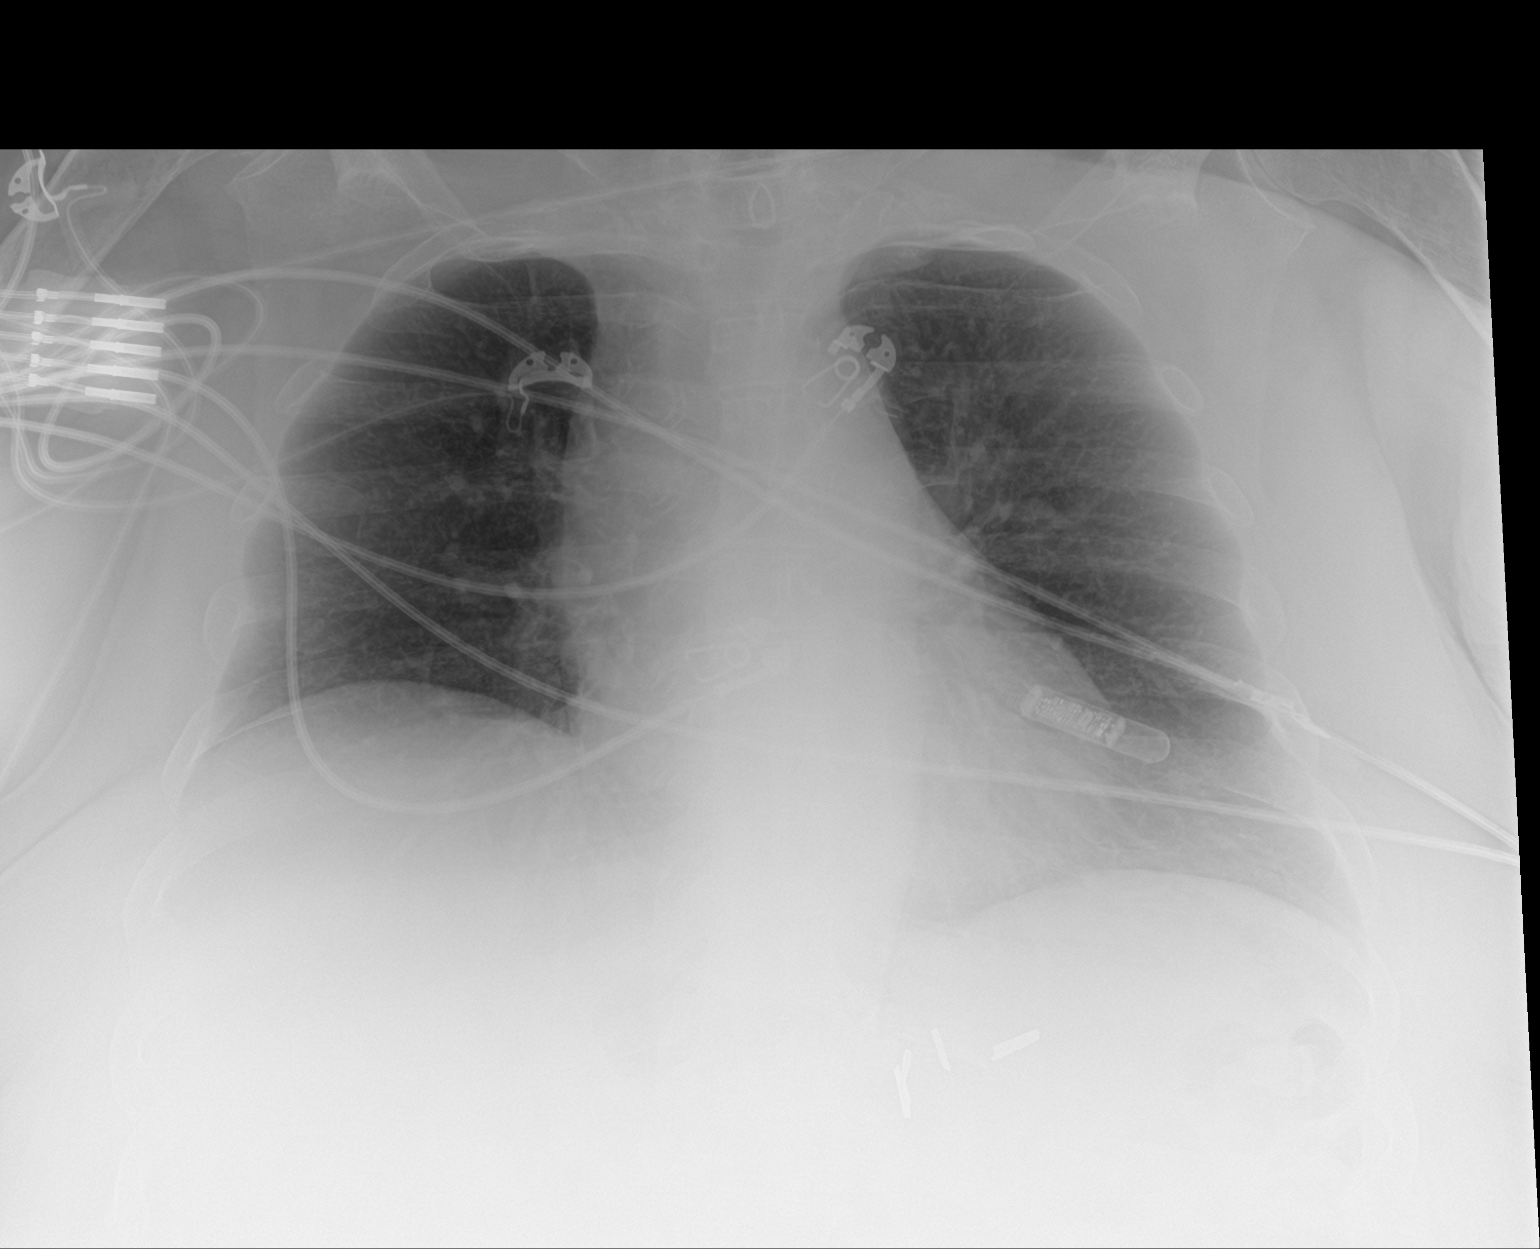

[2 of 2 positions shown; findings below may reference images not displayed]

FINDINGS: The lungs are clear. There is no pleural effusion or pneumothorax.
Top-normal cardiac silhouette. A loop recorder device is noted. No
acute osseous pathology.
IMPRESSION: No active cardiopulmonary disease.

## 2019-08-19 ENCOUNTER — Ambulatory Visit: Payer: Medicare HMO | Admitting: Acute Care

## 2019-08-19 ENCOUNTER — Other Ambulatory Visit: Payer: Self-pay

## 2019-08-26 DIAGNOSIS — F419 Anxiety disorder, unspecified: Secondary | ICD-10-CM | POA: Diagnosis not present

## 2019-10-02 ENCOUNTER — Ambulatory Visit: Payer: Medicare HMO | Admitting: Adult Health

## 2019-10-02 ENCOUNTER — Ambulatory Visit: Payer: Medicare HMO | Admitting: Primary Care

## 2019-10-09 DIAGNOSIS — Z03818 Encounter for observation for suspected exposure to other biological agents ruled out: Secondary | ICD-10-CM | POA: Diagnosis not present

## 2019-10-15 DIAGNOSIS — I6200 Nontraumatic subdural hemorrhage, unspecified: Secondary | ICD-10-CM | POA: Diagnosis not present

## 2019-10-15 DIAGNOSIS — R519 Headache, unspecified: Secondary | ICD-10-CM | POA: Diagnosis not present

## 2019-10-15 DIAGNOSIS — M25511 Pain in right shoulder: Secondary | ICD-10-CM | POA: Diagnosis not present

## 2019-10-15 DIAGNOSIS — F419 Anxiety disorder, unspecified: Secondary | ICD-10-CM | POA: Diagnosis not present

## 2019-10-16 DIAGNOSIS — M130 Polyarthritis, unspecified: Secondary | ICD-10-CM | POA: Diagnosis not present

## 2019-10-16 DIAGNOSIS — Z20822 Contact with and (suspected) exposure to covid-19: Secondary | ICD-10-CM | POA: Diagnosis not present

## 2019-10-16 DIAGNOSIS — G8929 Other chronic pain: Secondary | ICD-10-CM | POA: Diagnosis not present

## 2019-10-16 DIAGNOSIS — M545 Low back pain: Secondary | ICD-10-CM | POA: Diagnosis not present

## 2019-10-19 DIAGNOSIS — F39 Unspecified mood [affective] disorder: Secondary | ICD-10-CM | POA: Diagnosis not present

## 2019-10-19 DIAGNOSIS — M545 Low back pain: Secondary | ICD-10-CM | POA: Diagnosis not present

## 2019-10-19 DIAGNOSIS — Z48811 Encounter for surgical aftercare following surgery on the nervous system: Secondary | ICD-10-CM | POA: Diagnosis not present

## 2019-10-19 DIAGNOSIS — M6281 Muscle weakness (generalized): Secondary | ICD-10-CM | POA: Diagnosis not present

## 2019-10-19 DIAGNOSIS — I5032 Chronic diastolic (congestive) heart failure: Secondary | ICD-10-CM | POA: Diagnosis not present

## 2019-10-19 DIAGNOSIS — J449 Chronic obstructive pulmonary disease, unspecified: Secondary | ICD-10-CM | POA: Diagnosis not present

## 2019-10-19 DIAGNOSIS — F4323 Adjustment disorder with mixed anxiety and depressed mood: Secondary | ICD-10-CM | POA: Diagnosis not present

## 2019-10-19 DIAGNOSIS — Z741 Need for assistance with personal care: Secondary | ICD-10-CM | POA: Diagnosis not present

## 2019-10-19 DIAGNOSIS — R278 Other lack of coordination: Secondary | ICD-10-CM | POA: Diagnosis not present

## 2019-10-20 DIAGNOSIS — Z741 Need for assistance with personal care: Secondary | ICD-10-CM | POA: Diagnosis not present

## 2019-10-20 DIAGNOSIS — J9610 Chronic respiratory failure, unspecified whether with hypoxia or hypercapnia: Secondary | ICD-10-CM | POA: Diagnosis not present

## 2019-10-20 DIAGNOSIS — R262 Difficulty in walking, not elsewhere classified: Secondary | ICD-10-CM | POA: Diagnosis not present

## 2019-10-20 DIAGNOSIS — E782 Mixed hyperlipidemia: Secondary | ICD-10-CM | POA: Diagnosis not present

## 2019-10-20 DIAGNOSIS — M6281 Muscle weakness (generalized): Secondary | ICD-10-CM | POA: Diagnosis not present

## 2019-10-20 DIAGNOSIS — R2681 Unsteadiness on feet: Secondary | ICD-10-CM | POA: Diagnosis not present

## 2019-10-20 DIAGNOSIS — M545 Low back pain: Secondary | ICD-10-CM | POA: Diagnosis not present

## 2019-10-20 DIAGNOSIS — Z48811 Encounter for surgical aftercare following surgery on the nervous system: Secondary | ICD-10-CM | POA: Diagnosis not present

## 2019-10-20 DIAGNOSIS — R52 Pain, unspecified: Secondary | ICD-10-CM | POA: Diagnosis not present

## 2019-10-20 DIAGNOSIS — J449 Chronic obstructive pulmonary disease, unspecified: Secondary | ICD-10-CM | POA: Diagnosis not present

## 2019-10-20 DIAGNOSIS — R278 Other lack of coordination: Secondary | ICD-10-CM | POA: Diagnosis not present

## 2019-10-20 DIAGNOSIS — I10 Essential (primary) hypertension: Secondary | ICD-10-CM | POA: Diagnosis not present

## 2019-10-20 DIAGNOSIS — I5032 Chronic diastolic (congestive) heart failure: Secondary | ICD-10-CM | POA: Diagnosis not present

## 2019-10-21 DIAGNOSIS — M533 Sacrococcygeal disorders, not elsewhere classified: Secondary | ICD-10-CM | POA: Diagnosis not present

## 2019-10-21 DIAGNOSIS — J449 Chronic obstructive pulmonary disease, unspecified: Secondary | ICD-10-CM | POA: Diagnosis not present

## 2019-10-21 DIAGNOSIS — M6281 Muscle weakness (generalized): Secondary | ICD-10-CM | POA: Diagnosis not present

## 2019-10-21 DIAGNOSIS — I5032 Chronic diastolic (congestive) heart failure: Secondary | ICD-10-CM | POA: Diagnosis not present

## 2019-10-21 DIAGNOSIS — Z48811 Encounter for surgical aftercare following surgery on the nervous system: Secondary | ICD-10-CM | POA: Diagnosis not present

## 2019-10-21 DIAGNOSIS — M545 Low back pain: Secondary | ICD-10-CM | POA: Diagnosis not present

## 2019-10-21 DIAGNOSIS — Z741 Need for assistance with personal care: Secondary | ICD-10-CM | POA: Diagnosis not present

## 2019-10-21 DIAGNOSIS — R278 Other lack of coordination: Secondary | ICD-10-CM | POA: Diagnosis not present

## 2019-10-22 DIAGNOSIS — M545 Low back pain: Secondary | ICD-10-CM | POA: Diagnosis not present

## 2019-10-22 DIAGNOSIS — J449 Chronic obstructive pulmonary disease, unspecified: Secondary | ICD-10-CM | POA: Diagnosis not present

## 2019-10-22 DIAGNOSIS — I10 Essential (primary) hypertension: Secondary | ICD-10-CM | POA: Diagnosis not present

## 2019-10-22 DIAGNOSIS — R2681 Unsteadiness on feet: Secondary | ICD-10-CM | POA: Diagnosis not present

## 2019-10-22 DIAGNOSIS — J9610 Chronic respiratory failure, unspecified whether with hypoxia or hypercapnia: Secondary | ICD-10-CM | POA: Diagnosis not present

## 2019-10-22 DIAGNOSIS — I5032 Chronic diastolic (congestive) heart failure: Secondary | ICD-10-CM | POA: Diagnosis not present

## 2019-10-22 DIAGNOSIS — R262 Difficulty in walking, not elsewhere classified: Secondary | ICD-10-CM | POA: Diagnosis not present

## 2019-10-22 DIAGNOSIS — R278 Other lack of coordination: Secondary | ICD-10-CM | POA: Diagnosis not present

## 2019-10-22 DIAGNOSIS — Z48810 Encounter for surgical aftercare following surgery on the sense organs: Secondary | ICD-10-CM | POA: Diagnosis not present

## 2019-10-22 DIAGNOSIS — Z741 Need for assistance with personal care: Secondary | ICD-10-CM | POA: Diagnosis not present

## 2019-10-22 DIAGNOSIS — R52 Pain, unspecified: Secondary | ICD-10-CM | POA: Diagnosis not present

## 2019-10-22 DIAGNOSIS — E782 Mixed hyperlipidemia: Secondary | ICD-10-CM | POA: Diagnosis not present

## 2019-10-22 DIAGNOSIS — M6281 Muscle weakness (generalized): Secondary | ICD-10-CM | POA: Diagnosis not present

## 2019-10-22 DIAGNOSIS — I6200 Nontraumatic subdural hemorrhage, unspecified: Secondary | ICD-10-CM | POA: Diagnosis not present

## 2019-10-22 DIAGNOSIS — Z48811 Encounter for surgical aftercare following surgery on the nervous system: Secondary | ICD-10-CM | POA: Diagnosis not present

## 2019-10-23 DIAGNOSIS — J449 Chronic obstructive pulmonary disease, unspecified: Secondary | ICD-10-CM | POA: Diagnosis not present

## 2019-10-23 DIAGNOSIS — R278 Other lack of coordination: Secondary | ICD-10-CM | POA: Diagnosis not present

## 2019-10-23 DIAGNOSIS — Z48811 Encounter for surgical aftercare following surgery on the nervous system: Secondary | ICD-10-CM | POA: Diagnosis not present

## 2019-10-23 DIAGNOSIS — Z741 Need for assistance with personal care: Secondary | ICD-10-CM | POA: Diagnosis not present

## 2019-10-23 DIAGNOSIS — I5032 Chronic diastolic (congestive) heart failure: Secondary | ICD-10-CM | POA: Diagnosis not present

## 2019-10-23 DIAGNOSIS — M6281 Muscle weakness (generalized): Secondary | ICD-10-CM | POA: Diagnosis not present

## 2019-10-23 DIAGNOSIS — Z03818 Encounter for observation for suspected exposure to other biological agents ruled out: Secondary | ICD-10-CM | POA: Diagnosis not present

## 2019-10-23 DIAGNOSIS — M545 Low back pain: Secondary | ICD-10-CM | POA: Diagnosis not present

## 2019-10-26 DIAGNOSIS — Z48811 Encounter for surgical aftercare following surgery on the nervous system: Secondary | ICD-10-CM | POA: Diagnosis not present

## 2019-10-26 DIAGNOSIS — J449 Chronic obstructive pulmonary disease, unspecified: Secondary | ICD-10-CM | POA: Diagnosis not present

## 2019-10-26 DIAGNOSIS — M6281 Muscle weakness (generalized): Secondary | ICD-10-CM | POA: Diagnosis not present

## 2019-10-26 DIAGNOSIS — M545 Low back pain: Secondary | ICD-10-CM | POA: Diagnosis not present

## 2019-10-26 DIAGNOSIS — I5032 Chronic diastolic (congestive) heart failure: Secondary | ICD-10-CM | POA: Diagnosis not present

## 2019-10-26 DIAGNOSIS — Z741 Need for assistance with personal care: Secondary | ICD-10-CM | POA: Diagnosis not present

## 2019-10-26 DIAGNOSIS — R278 Other lack of coordination: Secondary | ICD-10-CM | POA: Diagnosis not present

## 2019-10-27 DIAGNOSIS — R262 Difficulty in walking, not elsewhere classified: Secondary | ICD-10-CM | POA: Diagnosis not present

## 2019-10-27 DIAGNOSIS — Z741 Need for assistance with personal care: Secondary | ICD-10-CM | POA: Diagnosis not present

## 2019-10-27 DIAGNOSIS — R52 Pain, unspecified: Secondary | ICD-10-CM | POA: Diagnosis not present

## 2019-10-27 DIAGNOSIS — J9610 Chronic respiratory failure, unspecified whether with hypoxia or hypercapnia: Secondary | ICD-10-CM | POA: Diagnosis not present

## 2019-10-27 DIAGNOSIS — I10 Essential (primary) hypertension: Secondary | ICD-10-CM | POA: Diagnosis not present

## 2019-10-27 DIAGNOSIS — R278 Other lack of coordination: Secondary | ICD-10-CM | POA: Diagnosis not present

## 2019-10-27 DIAGNOSIS — Z48811 Encounter for surgical aftercare following surgery on the nervous system: Secondary | ICD-10-CM | POA: Diagnosis not present

## 2019-10-27 DIAGNOSIS — M545 Low back pain: Secondary | ICD-10-CM | POA: Diagnosis not present

## 2019-10-27 DIAGNOSIS — M6281 Muscle weakness (generalized): Secondary | ICD-10-CM | POA: Diagnosis not present

## 2019-10-27 DIAGNOSIS — I5032 Chronic diastolic (congestive) heart failure: Secondary | ICD-10-CM | POA: Diagnosis not present

## 2019-10-27 DIAGNOSIS — E782 Mixed hyperlipidemia: Secondary | ICD-10-CM | POA: Diagnosis not present

## 2019-10-27 DIAGNOSIS — R2681 Unsteadiness on feet: Secondary | ICD-10-CM | POA: Diagnosis not present

## 2019-10-27 DIAGNOSIS — J449 Chronic obstructive pulmonary disease, unspecified: Secondary | ICD-10-CM | POA: Diagnosis not present

## 2019-10-28 DIAGNOSIS — Z48811 Encounter for surgical aftercare following surgery on the nervous system: Secondary | ICD-10-CM | POA: Diagnosis not present

## 2019-10-28 DIAGNOSIS — Z741 Need for assistance with personal care: Secondary | ICD-10-CM | POA: Diagnosis not present

## 2019-10-28 DIAGNOSIS — M6281 Muscle weakness (generalized): Secondary | ICD-10-CM | POA: Diagnosis not present

## 2019-10-28 DIAGNOSIS — R278 Other lack of coordination: Secondary | ICD-10-CM | POA: Diagnosis not present

## 2019-10-28 DIAGNOSIS — J449 Chronic obstructive pulmonary disease, unspecified: Secondary | ICD-10-CM | POA: Diagnosis not present

## 2019-10-28 DIAGNOSIS — I5032 Chronic diastolic (congestive) heart failure: Secondary | ICD-10-CM | POA: Diagnosis not present

## 2019-10-28 DIAGNOSIS — M545 Low back pain: Secondary | ICD-10-CM | POA: Diagnosis not present

## 2019-10-29 DIAGNOSIS — R278 Other lack of coordination: Secondary | ICD-10-CM | POA: Diagnosis not present

## 2019-10-29 DIAGNOSIS — Z48811 Encounter for surgical aftercare following surgery on the nervous system: Secondary | ICD-10-CM | POA: Diagnosis not present

## 2019-10-29 DIAGNOSIS — Z741 Need for assistance with personal care: Secondary | ICD-10-CM | POA: Diagnosis not present

## 2019-10-29 DIAGNOSIS — M6281 Muscle weakness (generalized): Secondary | ICD-10-CM | POA: Diagnosis not present

## 2019-10-29 DIAGNOSIS — I5032 Chronic diastolic (congestive) heart failure: Secondary | ICD-10-CM | POA: Diagnosis not present

## 2019-10-29 DIAGNOSIS — M545 Low back pain: Secondary | ICD-10-CM | POA: Diagnosis not present

## 2019-10-29 DIAGNOSIS — J449 Chronic obstructive pulmonary disease, unspecified: Secondary | ICD-10-CM | POA: Diagnosis not present

## 2019-10-30 DIAGNOSIS — R278 Other lack of coordination: Secondary | ICD-10-CM | POA: Diagnosis not present

## 2019-10-30 DIAGNOSIS — M545 Low back pain: Secondary | ICD-10-CM | POA: Diagnosis not present

## 2019-10-30 DIAGNOSIS — I5032 Chronic diastolic (congestive) heart failure: Secondary | ICD-10-CM | POA: Diagnosis not present

## 2019-10-30 DIAGNOSIS — J449 Chronic obstructive pulmonary disease, unspecified: Secondary | ICD-10-CM | POA: Diagnosis not present

## 2019-10-30 DIAGNOSIS — M6281 Muscle weakness (generalized): Secondary | ICD-10-CM | POA: Diagnosis not present

## 2019-10-30 DIAGNOSIS — Z741 Need for assistance with personal care: Secondary | ICD-10-CM | POA: Diagnosis not present

## 2019-10-30 DIAGNOSIS — Z48811 Encounter for surgical aftercare following surgery on the nervous system: Secondary | ICD-10-CM | POA: Diagnosis not present

## 2019-11-02 DIAGNOSIS — Z48811 Encounter for surgical aftercare following surgery on the nervous system: Secondary | ICD-10-CM | POA: Diagnosis not present

## 2019-11-02 DIAGNOSIS — M545 Low back pain: Secondary | ICD-10-CM | POA: Diagnosis not present

## 2019-11-02 DIAGNOSIS — F4323 Adjustment disorder with mixed anxiety and depressed mood: Secondary | ICD-10-CM | POA: Diagnosis not present

## 2019-11-02 DIAGNOSIS — M6281 Muscle weakness (generalized): Secondary | ICD-10-CM | POA: Diagnosis not present

## 2019-11-02 DIAGNOSIS — I5032 Chronic diastolic (congestive) heart failure: Secondary | ICD-10-CM | POA: Diagnosis not present

## 2019-11-02 DIAGNOSIS — J449 Chronic obstructive pulmonary disease, unspecified: Secondary | ICD-10-CM | POA: Diagnosis not present

## 2019-11-02 DIAGNOSIS — R278 Other lack of coordination: Secondary | ICD-10-CM | POA: Diagnosis not present

## 2019-11-02 DIAGNOSIS — Z741 Need for assistance with personal care: Secondary | ICD-10-CM | POA: Diagnosis not present

## 2019-11-03 DIAGNOSIS — R2681 Unsteadiness on feet: Secondary | ICD-10-CM | POA: Diagnosis not present

## 2019-11-03 DIAGNOSIS — J449 Chronic obstructive pulmonary disease, unspecified: Secondary | ICD-10-CM | POA: Diagnosis not present

## 2019-11-03 DIAGNOSIS — Z741 Need for assistance with personal care: Secondary | ICD-10-CM | POA: Diagnosis not present

## 2019-11-03 DIAGNOSIS — M545 Low back pain: Secondary | ICD-10-CM | POA: Diagnosis not present

## 2019-11-03 DIAGNOSIS — M6281 Muscle weakness (generalized): Secondary | ICD-10-CM | POA: Diagnosis not present

## 2019-11-03 DIAGNOSIS — I10 Essential (primary) hypertension: Secondary | ICD-10-CM | POA: Diagnosis not present

## 2019-11-03 DIAGNOSIS — R262 Difficulty in walking, not elsewhere classified: Secondary | ICD-10-CM | POA: Diagnosis not present

## 2019-11-03 DIAGNOSIS — Z48811 Encounter for surgical aftercare following surgery on the nervous system: Secondary | ICD-10-CM | POA: Diagnosis not present

## 2019-11-03 DIAGNOSIS — J9610 Chronic respiratory failure, unspecified whether with hypoxia or hypercapnia: Secondary | ICD-10-CM | POA: Diagnosis not present

## 2019-11-03 DIAGNOSIS — R52 Pain, unspecified: Secondary | ICD-10-CM | POA: Diagnosis not present

## 2019-11-03 DIAGNOSIS — E782 Mixed hyperlipidemia: Secondary | ICD-10-CM | POA: Diagnosis not present

## 2019-11-03 DIAGNOSIS — R278 Other lack of coordination: Secondary | ICD-10-CM | POA: Diagnosis not present

## 2019-11-03 DIAGNOSIS — I5032 Chronic diastolic (congestive) heart failure: Secondary | ICD-10-CM | POA: Diagnosis not present

## 2019-11-04 DIAGNOSIS — M545 Low back pain: Secondary | ICD-10-CM | POA: Diagnosis not present

## 2019-11-04 DIAGNOSIS — Z741 Need for assistance with personal care: Secondary | ICD-10-CM | POA: Diagnosis not present

## 2019-11-04 DIAGNOSIS — I5032 Chronic diastolic (congestive) heart failure: Secondary | ICD-10-CM | POA: Diagnosis not present

## 2019-11-04 DIAGNOSIS — J449 Chronic obstructive pulmonary disease, unspecified: Secondary | ICD-10-CM | POA: Diagnosis not present

## 2019-11-04 DIAGNOSIS — R278 Other lack of coordination: Secondary | ICD-10-CM | POA: Diagnosis not present

## 2019-11-04 DIAGNOSIS — Z48811 Encounter for surgical aftercare following surgery on the nervous system: Secondary | ICD-10-CM | POA: Diagnosis not present

## 2019-11-04 DIAGNOSIS — M6281 Muscle weakness (generalized): Secondary | ICD-10-CM | POA: Diagnosis not present

## 2019-11-05 DIAGNOSIS — I5032 Chronic diastolic (congestive) heart failure: Secondary | ICD-10-CM | POA: Diagnosis not present

## 2019-11-05 DIAGNOSIS — I10 Essential (primary) hypertension: Secondary | ICD-10-CM | POA: Diagnosis not present

## 2019-11-05 DIAGNOSIS — J9610 Chronic respiratory failure, unspecified whether with hypoxia or hypercapnia: Secondary | ICD-10-CM | POA: Diagnosis not present

## 2019-11-05 DIAGNOSIS — M545 Low back pain: Secondary | ICD-10-CM | POA: Diagnosis not present

## 2019-11-05 DIAGNOSIS — M6281 Muscle weakness (generalized): Secondary | ICD-10-CM | POA: Diagnosis not present

## 2019-11-05 DIAGNOSIS — J45909 Unspecified asthma, uncomplicated: Secondary | ICD-10-CM | POA: Diagnosis not present

## 2019-11-05 DIAGNOSIS — J9601 Acute respiratory failure with hypoxia: Secondary | ICD-10-CM | POA: Diagnosis not present

## 2019-11-05 DIAGNOSIS — R2681 Unsteadiness on feet: Secondary | ICD-10-CM | POA: Diagnosis not present

## 2019-11-05 DIAGNOSIS — R52 Pain, unspecified: Secondary | ICD-10-CM | POA: Diagnosis not present

## 2019-11-05 DIAGNOSIS — J449 Chronic obstructive pulmonary disease, unspecified: Secondary | ICD-10-CM | POA: Diagnosis not present

## 2019-11-05 DIAGNOSIS — R0902 Hypoxemia: Secondary | ICD-10-CM | POA: Diagnosis not present

## 2019-11-05 DIAGNOSIS — Z48811 Encounter for surgical aftercare following surgery on the nervous system: Secondary | ICD-10-CM | POA: Diagnosis not present

## 2019-11-05 DIAGNOSIS — Z741 Need for assistance with personal care: Secondary | ICD-10-CM | POA: Diagnosis not present

## 2019-11-05 DIAGNOSIS — R278 Other lack of coordination: Secondary | ICD-10-CM | POA: Diagnosis not present

## 2019-11-05 DIAGNOSIS — R262 Difficulty in walking, not elsewhere classified: Secondary | ICD-10-CM | POA: Diagnosis not present

## 2019-11-05 DIAGNOSIS — E782 Mixed hyperlipidemia: Secondary | ICD-10-CM | POA: Diagnosis not present

## 2019-11-06 DIAGNOSIS — M6281 Muscle weakness (generalized): Secondary | ICD-10-CM | POA: Diagnosis not present

## 2019-11-06 DIAGNOSIS — Z741 Need for assistance with personal care: Secondary | ICD-10-CM | POA: Diagnosis not present

## 2019-11-06 DIAGNOSIS — R262 Difficulty in walking, not elsewhere classified: Secondary | ICD-10-CM | POA: Diagnosis not present

## 2019-11-06 DIAGNOSIS — J449 Chronic obstructive pulmonary disease, unspecified: Secondary | ICD-10-CM | POA: Diagnosis not present

## 2019-11-06 DIAGNOSIS — I5032 Chronic diastolic (congestive) heart failure: Secondary | ICD-10-CM | POA: Diagnosis not present

## 2019-11-06 DIAGNOSIS — R278 Other lack of coordination: Secondary | ICD-10-CM | POA: Diagnosis not present

## 2019-11-06 DIAGNOSIS — Z03818 Encounter for observation for suspected exposure to other biological agents ruled out: Secondary | ICD-10-CM | POA: Diagnosis not present

## 2019-11-06 DIAGNOSIS — Z48811 Encounter for surgical aftercare following surgery on the nervous system: Secondary | ICD-10-CM | POA: Diagnosis not present

## 2019-11-08 DIAGNOSIS — J45909 Unspecified asthma, uncomplicated: Secondary | ICD-10-CM | POA: Diagnosis not present

## 2019-11-09 DIAGNOSIS — M6281 Muscle weakness (generalized): Secondary | ICD-10-CM | POA: Diagnosis not present

## 2019-11-09 DIAGNOSIS — Z48811 Encounter for surgical aftercare following surgery on the nervous system: Secondary | ICD-10-CM | POA: Diagnosis not present

## 2019-11-09 DIAGNOSIS — I10 Essential (primary) hypertension: Secondary | ICD-10-CM | POA: Diagnosis not present

## 2019-11-09 DIAGNOSIS — R2681 Unsteadiness on feet: Secondary | ICD-10-CM | POA: Diagnosis not present

## 2019-11-09 DIAGNOSIS — R262 Difficulty in walking, not elsewhere classified: Secondary | ICD-10-CM | POA: Diagnosis not present

## 2019-11-09 DIAGNOSIS — J449 Chronic obstructive pulmonary disease, unspecified: Secondary | ICD-10-CM | POA: Diagnosis not present

## 2019-11-09 DIAGNOSIS — R52 Pain, unspecified: Secondary | ICD-10-CM | POA: Diagnosis not present

## 2019-11-09 DIAGNOSIS — Z741 Need for assistance with personal care: Secondary | ICD-10-CM | POA: Diagnosis not present

## 2019-11-09 DIAGNOSIS — R278 Other lack of coordination: Secondary | ICD-10-CM | POA: Diagnosis not present

## 2019-11-09 DIAGNOSIS — E782 Mixed hyperlipidemia: Secondary | ICD-10-CM | POA: Diagnosis not present

## 2019-11-09 DIAGNOSIS — J9610 Chronic respiratory failure, unspecified whether with hypoxia or hypercapnia: Secondary | ICD-10-CM | POA: Diagnosis not present

## 2019-11-09 DIAGNOSIS — I5032 Chronic diastolic (congestive) heart failure: Secondary | ICD-10-CM | POA: Diagnosis not present

## 2019-11-10 DIAGNOSIS — M6281 Muscle weakness (generalized): Secondary | ICD-10-CM | POA: Diagnosis not present

## 2019-11-10 DIAGNOSIS — Z741 Need for assistance with personal care: Secondary | ICD-10-CM | POA: Diagnosis not present

## 2019-11-10 DIAGNOSIS — J449 Chronic obstructive pulmonary disease, unspecified: Secondary | ICD-10-CM | POA: Diagnosis not present

## 2019-11-10 DIAGNOSIS — R262 Difficulty in walking, not elsewhere classified: Secondary | ICD-10-CM | POA: Diagnosis not present

## 2019-11-10 DIAGNOSIS — I5032 Chronic diastolic (congestive) heart failure: Secondary | ICD-10-CM | POA: Diagnosis not present

## 2019-11-10 DIAGNOSIS — Z48811 Encounter for surgical aftercare following surgery on the nervous system: Secondary | ICD-10-CM | POA: Diagnosis not present

## 2019-11-10 DIAGNOSIS — R278 Other lack of coordination: Secondary | ICD-10-CM | POA: Diagnosis not present

## 2019-11-11 DIAGNOSIS — J449 Chronic obstructive pulmonary disease, unspecified: Secondary | ICD-10-CM | POA: Diagnosis not present

## 2019-11-11 DIAGNOSIS — R2681 Unsteadiness on feet: Secondary | ICD-10-CM | POA: Diagnosis not present

## 2019-11-11 DIAGNOSIS — R262 Difficulty in walking, not elsewhere classified: Secondary | ICD-10-CM | POA: Diagnosis not present

## 2019-11-11 DIAGNOSIS — I5032 Chronic diastolic (congestive) heart failure: Secondary | ICD-10-CM | POA: Diagnosis not present

## 2019-11-11 DIAGNOSIS — I10 Essential (primary) hypertension: Secondary | ICD-10-CM | POA: Diagnosis not present

## 2019-11-11 DIAGNOSIS — J9610 Chronic respiratory failure, unspecified whether with hypoxia or hypercapnia: Secondary | ICD-10-CM | POA: Diagnosis not present

## 2019-11-11 DIAGNOSIS — Z741 Need for assistance with personal care: Secondary | ICD-10-CM | POA: Diagnosis not present

## 2019-11-11 DIAGNOSIS — M6281 Muscle weakness (generalized): Secondary | ICD-10-CM | POA: Diagnosis not present

## 2019-11-11 DIAGNOSIS — R52 Pain, unspecified: Secondary | ICD-10-CM | POA: Diagnosis not present

## 2019-11-11 DIAGNOSIS — Z48811 Encounter for surgical aftercare following surgery on the nervous system: Secondary | ICD-10-CM | POA: Diagnosis not present

## 2019-11-11 DIAGNOSIS — E782 Mixed hyperlipidemia: Secondary | ICD-10-CM | POA: Diagnosis not present

## 2019-11-11 DIAGNOSIS — R278 Other lack of coordination: Secondary | ICD-10-CM | POA: Diagnosis not present

## 2019-11-12 DIAGNOSIS — R278 Other lack of coordination: Secondary | ICD-10-CM | POA: Diagnosis not present

## 2019-11-12 DIAGNOSIS — M6281 Muscle weakness (generalized): Secondary | ICD-10-CM | POA: Diagnosis not present

## 2019-11-12 DIAGNOSIS — R262 Difficulty in walking, not elsewhere classified: Secondary | ICD-10-CM | POA: Diagnosis not present

## 2019-11-12 DIAGNOSIS — I5032 Chronic diastolic (congestive) heart failure: Secondary | ICD-10-CM | POA: Diagnosis not present

## 2019-11-12 DIAGNOSIS — J449 Chronic obstructive pulmonary disease, unspecified: Secondary | ICD-10-CM | POA: Diagnosis not present

## 2019-11-12 DIAGNOSIS — Z48811 Encounter for surgical aftercare following surgery on the nervous system: Secondary | ICD-10-CM | POA: Diagnosis not present

## 2019-11-12 DIAGNOSIS — Z741 Need for assistance with personal care: Secondary | ICD-10-CM | POA: Diagnosis not present

## 2019-11-13 DIAGNOSIS — I5032 Chronic diastolic (congestive) heart failure: Secondary | ICD-10-CM | POA: Diagnosis not present

## 2019-11-13 DIAGNOSIS — R52 Pain, unspecified: Secondary | ICD-10-CM | POA: Diagnosis not present

## 2019-11-13 DIAGNOSIS — Z741 Need for assistance with personal care: Secondary | ICD-10-CM | POA: Diagnosis not present

## 2019-11-13 DIAGNOSIS — M6281 Muscle weakness (generalized): Secondary | ICD-10-CM | POA: Diagnosis not present

## 2019-11-13 DIAGNOSIS — E782 Mixed hyperlipidemia: Secondary | ICD-10-CM | POA: Diagnosis not present

## 2019-11-13 DIAGNOSIS — R278 Other lack of coordination: Secondary | ICD-10-CM | POA: Diagnosis not present

## 2019-11-13 DIAGNOSIS — J9610 Chronic respiratory failure, unspecified whether with hypoxia or hypercapnia: Secondary | ICD-10-CM | POA: Diagnosis not present

## 2019-11-13 DIAGNOSIS — I10 Essential (primary) hypertension: Secondary | ICD-10-CM | POA: Diagnosis not present

## 2019-11-13 DIAGNOSIS — R262 Difficulty in walking, not elsewhere classified: Secondary | ICD-10-CM | POA: Diagnosis not present

## 2019-11-13 DIAGNOSIS — Z48811 Encounter for surgical aftercare following surgery on the nervous system: Secondary | ICD-10-CM | POA: Diagnosis not present

## 2019-11-13 DIAGNOSIS — J449 Chronic obstructive pulmonary disease, unspecified: Secondary | ICD-10-CM | POA: Diagnosis not present

## 2019-11-13 DIAGNOSIS — R2681 Unsteadiness on feet: Secondary | ICD-10-CM | POA: Diagnosis not present

## 2019-11-16 DIAGNOSIS — I5032 Chronic diastolic (congestive) heart failure: Secondary | ICD-10-CM | POA: Diagnosis not present

## 2019-11-16 DIAGNOSIS — M6281 Muscle weakness (generalized): Secondary | ICD-10-CM | POA: Diagnosis not present

## 2019-11-16 DIAGNOSIS — Z741 Need for assistance with personal care: Secondary | ICD-10-CM | POA: Diagnosis not present

## 2019-11-16 DIAGNOSIS — J449 Chronic obstructive pulmonary disease, unspecified: Secondary | ICD-10-CM | POA: Diagnosis not present

## 2019-11-16 DIAGNOSIS — R278 Other lack of coordination: Secondary | ICD-10-CM | POA: Diagnosis not present

## 2019-11-16 DIAGNOSIS — R262 Difficulty in walking, not elsewhere classified: Secondary | ICD-10-CM | POA: Diagnosis not present

## 2019-11-16 DIAGNOSIS — Z48811 Encounter for surgical aftercare following surgery on the nervous system: Secondary | ICD-10-CM | POA: Diagnosis not present

## 2019-11-17 DIAGNOSIS — M6281 Muscle weakness (generalized): Secondary | ICD-10-CM | POA: Diagnosis not present

## 2019-11-17 DIAGNOSIS — R2681 Unsteadiness on feet: Secondary | ICD-10-CM | POA: Diagnosis not present

## 2019-11-17 DIAGNOSIS — I10 Essential (primary) hypertension: Secondary | ICD-10-CM | POA: Diagnosis not present

## 2019-11-17 DIAGNOSIS — J449 Chronic obstructive pulmonary disease, unspecified: Secondary | ICD-10-CM | POA: Diagnosis not present

## 2019-11-17 DIAGNOSIS — Z741 Need for assistance with personal care: Secondary | ICD-10-CM | POA: Diagnosis not present

## 2019-11-17 DIAGNOSIS — E782 Mixed hyperlipidemia: Secondary | ICD-10-CM | POA: Diagnosis not present

## 2019-11-17 DIAGNOSIS — R262 Difficulty in walking, not elsewhere classified: Secondary | ICD-10-CM | POA: Diagnosis not present

## 2019-11-17 DIAGNOSIS — R278 Other lack of coordination: Secondary | ICD-10-CM | POA: Diagnosis not present

## 2019-11-17 DIAGNOSIS — J9610 Chronic respiratory failure, unspecified whether with hypoxia or hypercapnia: Secondary | ICD-10-CM | POA: Diagnosis not present

## 2019-11-17 DIAGNOSIS — R52 Pain, unspecified: Secondary | ICD-10-CM | POA: Diagnosis not present

## 2019-11-17 DIAGNOSIS — Z48811 Encounter for surgical aftercare following surgery on the nervous system: Secondary | ICD-10-CM | POA: Diagnosis not present

## 2019-11-17 DIAGNOSIS — I5032 Chronic diastolic (congestive) heart failure: Secondary | ICD-10-CM | POA: Diagnosis not present

## 2019-11-18 DIAGNOSIS — R278 Other lack of coordination: Secondary | ICD-10-CM | POA: Diagnosis not present

## 2019-11-18 DIAGNOSIS — I5032 Chronic diastolic (congestive) heart failure: Secondary | ICD-10-CM | POA: Diagnosis not present

## 2019-11-18 DIAGNOSIS — R262 Difficulty in walking, not elsewhere classified: Secondary | ICD-10-CM | POA: Diagnosis not present

## 2019-11-18 DIAGNOSIS — Z741 Need for assistance with personal care: Secondary | ICD-10-CM | POA: Diagnosis not present

## 2019-11-18 DIAGNOSIS — J449 Chronic obstructive pulmonary disease, unspecified: Secondary | ICD-10-CM | POA: Diagnosis not present

## 2019-11-18 DIAGNOSIS — M6281 Muscle weakness (generalized): Secondary | ICD-10-CM | POA: Diagnosis not present

## 2019-11-18 DIAGNOSIS — Z48811 Encounter for surgical aftercare following surgery on the nervous system: Secondary | ICD-10-CM | POA: Diagnosis not present

## 2019-11-19 DIAGNOSIS — R52 Pain, unspecified: Secondary | ICD-10-CM | POA: Diagnosis not present

## 2019-11-19 DIAGNOSIS — E782 Mixed hyperlipidemia: Secondary | ICD-10-CM | POA: Diagnosis not present

## 2019-11-19 DIAGNOSIS — R278 Other lack of coordination: Secondary | ICD-10-CM | POA: Diagnosis not present

## 2019-11-19 DIAGNOSIS — I5032 Chronic diastolic (congestive) heart failure: Secondary | ICD-10-CM | POA: Diagnosis not present

## 2019-11-19 DIAGNOSIS — Z741 Need for assistance with personal care: Secondary | ICD-10-CM | POA: Diagnosis not present

## 2019-11-19 DIAGNOSIS — Z48811 Encounter for surgical aftercare following surgery on the nervous system: Secondary | ICD-10-CM | POA: Diagnosis not present

## 2019-11-19 DIAGNOSIS — J9610 Chronic respiratory failure, unspecified whether with hypoxia or hypercapnia: Secondary | ICD-10-CM | POA: Diagnosis not present

## 2019-11-19 DIAGNOSIS — R2681 Unsteadiness on feet: Secondary | ICD-10-CM | POA: Diagnosis not present

## 2019-11-19 DIAGNOSIS — R262 Difficulty in walking, not elsewhere classified: Secondary | ICD-10-CM | POA: Diagnosis not present

## 2019-11-19 DIAGNOSIS — I10 Essential (primary) hypertension: Secondary | ICD-10-CM | POA: Diagnosis not present

## 2019-11-19 DIAGNOSIS — M6281 Muscle weakness (generalized): Secondary | ICD-10-CM | POA: Diagnosis not present

## 2019-11-19 DIAGNOSIS — J449 Chronic obstructive pulmonary disease, unspecified: Secondary | ICD-10-CM | POA: Diagnosis not present

## 2019-11-20 DIAGNOSIS — M6281 Muscle weakness (generalized): Secondary | ICD-10-CM | POA: Diagnosis not present

## 2019-11-20 DIAGNOSIS — J449 Chronic obstructive pulmonary disease, unspecified: Secondary | ICD-10-CM | POA: Diagnosis not present

## 2019-11-20 DIAGNOSIS — R262 Difficulty in walking, not elsewhere classified: Secondary | ICD-10-CM | POA: Diagnosis not present

## 2019-11-20 DIAGNOSIS — R278 Other lack of coordination: Secondary | ICD-10-CM | POA: Diagnosis not present

## 2019-11-20 DIAGNOSIS — Z741 Need for assistance with personal care: Secondary | ICD-10-CM | POA: Diagnosis not present

## 2019-11-20 DIAGNOSIS — I5032 Chronic diastolic (congestive) heart failure: Secondary | ICD-10-CM | POA: Diagnosis not present

## 2019-11-20 DIAGNOSIS — Z48811 Encounter for surgical aftercare following surgery on the nervous system: Secondary | ICD-10-CM | POA: Diagnosis not present

## 2019-11-20 DIAGNOSIS — Z20822 Contact with and (suspected) exposure to covid-19: Secondary | ICD-10-CM | POA: Diagnosis not present

## 2019-11-23 DIAGNOSIS — R278 Other lack of coordination: Secondary | ICD-10-CM | POA: Diagnosis not present

## 2019-11-23 DIAGNOSIS — Z48811 Encounter for surgical aftercare following surgery on the nervous system: Secondary | ICD-10-CM | POA: Diagnosis not present

## 2019-11-23 DIAGNOSIS — I5032 Chronic diastolic (congestive) heart failure: Secondary | ICD-10-CM | POA: Diagnosis not present

## 2019-11-23 DIAGNOSIS — Z741 Need for assistance with personal care: Secondary | ICD-10-CM | POA: Diagnosis not present

## 2019-11-23 DIAGNOSIS — R262 Difficulty in walking, not elsewhere classified: Secondary | ICD-10-CM | POA: Diagnosis not present

## 2019-11-23 DIAGNOSIS — J449 Chronic obstructive pulmonary disease, unspecified: Secondary | ICD-10-CM | POA: Diagnosis not present

## 2019-11-23 DIAGNOSIS — M6281 Muscle weakness (generalized): Secondary | ICD-10-CM | POA: Diagnosis not present

## 2019-11-24 DIAGNOSIS — J9610 Chronic respiratory failure, unspecified whether with hypoxia or hypercapnia: Secondary | ICD-10-CM | POA: Diagnosis not present

## 2019-11-24 DIAGNOSIS — Z741 Need for assistance with personal care: Secondary | ICD-10-CM | POA: Diagnosis not present

## 2019-11-24 DIAGNOSIS — I10 Essential (primary) hypertension: Secondary | ICD-10-CM | POA: Diagnosis not present

## 2019-11-24 DIAGNOSIS — R278 Other lack of coordination: Secondary | ICD-10-CM | POA: Diagnosis not present

## 2019-11-24 DIAGNOSIS — R2681 Unsteadiness on feet: Secondary | ICD-10-CM | POA: Diagnosis not present

## 2019-11-24 DIAGNOSIS — E782 Mixed hyperlipidemia: Secondary | ICD-10-CM | POA: Diagnosis not present

## 2019-11-24 DIAGNOSIS — I5032 Chronic diastolic (congestive) heart failure: Secondary | ICD-10-CM | POA: Diagnosis not present

## 2019-11-24 DIAGNOSIS — M6281 Muscle weakness (generalized): Secondary | ICD-10-CM | POA: Diagnosis not present

## 2019-11-24 DIAGNOSIS — R52 Pain, unspecified: Secondary | ICD-10-CM | POA: Diagnosis not present

## 2019-11-24 DIAGNOSIS — Z48811 Encounter for surgical aftercare following surgery on the nervous system: Secondary | ICD-10-CM | POA: Diagnosis not present

## 2019-11-24 DIAGNOSIS — R262 Difficulty in walking, not elsewhere classified: Secondary | ICD-10-CM | POA: Diagnosis not present

## 2019-11-24 DIAGNOSIS — J449 Chronic obstructive pulmonary disease, unspecified: Secondary | ICD-10-CM | POA: Diagnosis not present

## 2019-11-25 DIAGNOSIS — J449 Chronic obstructive pulmonary disease, unspecified: Secondary | ICD-10-CM | POA: Diagnosis not present

## 2019-11-25 DIAGNOSIS — G8929 Other chronic pain: Secondary | ICD-10-CM | POA: Insufficient documentation

## 2019-11-25 DIAGNOSIS — R278 Other lack of coordination: Secondary | ICD-10-CM | POA: Diagnosis not present

## 2019-11-25 DIAGNOSIS — M170 Bilateral primary osteoarthritis of knee: Secondary | ICD-10-CM | POA: Diagnosis not present

## 2019-11-25 DIAGNOSIS — I5032 Chronic diastolic (congestive) heart failure: Secondary | ICD-10-CM | POA: Diagnosis not present

## 2019-11-25 DIAGNOSIS — R262 Difficulty in walking, not elsewhere classified: Secondary | ICD-10-CM | POA: Diagnosis not present

## 2019-11-25 DIAGNOSIS — Z741 Need for assistance with personal care: Secondary | ICD-10-CM | POA: Diagnosis not present

## 2019-11-25 DIAGNOSIS — Z48811 Encounter for surgical aftercare following surgery on the nervous system: Secondary | ICD-10-CM | POA: Diagnosis not present

## 2019-11-25 DIAGNOSIS — M25561 Pain in right knee: Secondary | ICD-10-CM | POA: Insufficient documentation

## 2019-11-25 DIAGNOSIS — M6281 Muscle weakness (generalized): Secondary | ICD-10-CM | POA: Diagnosis not present

## 2019-11-26 DIAGNOSIS — R262 Difficulty in walking, not elsewhere classified: Secondary | ICD-10-CM | POA: Diagnosis not present

## 2019-11-26 DIAGNOSIS — Z741 Need for assistance with personal care: Secondary | ICD-10-CM | POA: Diagnosis not present

## 2019-11-26 DIAGNOSIS — I5032 Chronic diastolic (congestive) heart failure: Secondary | ICD-10-CM | POA: Diagnosis not present

## 2019-11-26 DIAGNOSIS — R278 Other lack of coordination: Secondary | ICD-10-CM | POA: Diagnosis not present

## 2019-11-26 DIAGNOSIS — M6281 Muscle weakness (generalized): Secondary | ICD-10-CM | POA: Diagnosis not present

## 2019-11-26 DIAGNOSIS — Z48811 Encounter for surgical aftercare following surgery on the nervous system: Secondary | ICD-10-CM | POA: Diagnosis not present

## 2019-11-26 DIAGNOSIS — J449 Chronic obstructive pulmonary disease, unspecified: Secondary | ICD-10-CM | POA: Diagnosis not present

## 2019-11-27 DIAGNOSIS — R278 Other lack of coordination: Secondary | ICD-10-CM | POA: Diagnosis not present

## 2019-11-27 DIAGNOSIS — Z741 Need for assistance with personal care: Secondary | ICD-10-CM | POA: Diagnosis not present

## 2019-11-27 DIAGNOSIS — R262 Difficulty in walking, not elsewhere classified: Secondary | ICD-10-CM | POA: Diagnosis not present

## 2019-11-27 DIAGNOSIS — J449 Chronic obstructive pulmonary disease, unspecified: Secondary | ICD-10-CM | POA: Diagnosis not present

## 2019-11-27 DIAGNOSIS — M6281 Muscle weakness (generalized): Secondary | ICD-10-CM | POA: Diagnosis not present

## 2019-11-27 DIAGNOSIS — Z48811 Encounter for surgical aftercare following surgery on the nervous system: Secondary | ICD-10-CM | POA: Diagnosis not present

## 2019-11-27 DIAGNOSIS — I5032 Chronic diastolic (congestive) heart failure: Secondary | ICD-10-CM | POA: Diagnosis not present

## 2019-11-29 DIAGNOSIS — Z48811 Encounter for surgical aftercare following surgery on the nervous system: Secondary | ICD-10-CM | POA: Diagnosis not present

## 2019-11-29 DIAGNOSIS — R262 Difficulty in walking, not elsewhere classified: Secondary | ICD-10-CM | POA: Diagnosis not present

## 2019-11-29 DIAGNOSIS — Z741 Need for assistance with personal care: Secondary | ICD-10-CM | POA: Diagnosis not present

## 2019-11-29 DIAGNOSIS — J449 Chronic obstructive pulmonary disease, unspecified: Secondary | ICD-10-CM | POA: Diagnosis not present

## 2019-11-29 DIAGNOSIS — I5032 Chronic diastolic (congestive) heart failure: Secondary | ICD-10-CM | POA: Diagnosis not present

## 2019-11-29 DIAGNOSIS — M6281 Muscle weakness (generalized): Secondary | ICD-10-CM | POA: Diagnosis not present

## 2019-11-29 DIAGNOSIS — R278 Other lack of coordination: Secondary | ICD-10-CM | POA: Diagnosis not present

## 2019-11-30 DIAGNOSIS — R278 Other lack of coordination: Secondary | ICD-10-CM | POA: Diagnosis not present

## 2019-11-30 DIAGNOSIS — R2681 Unsteadiness on feet: Secondary | ICD-10-CM | POA: Diagnosis not present

## 2019-11-30 DIAGNOSIS — J449 Chronic obstructive pulmonary disease, unspecified: Secondary | ICD-10-CM | POA: Diagnosis not present

## 2019-11-30 DIAGNOSIS — Z48811 Encounter for surgical aftercare following surgery on the nervous system: Secondary | ICD-10-CM | POA: Diagnosis not present

## 2019-11-30 DIAGNOSIS — I5032 Chronic diastolic (congestive) heart failure: Secondary | ICD-10-CM | POA: Diagnosis not present

## 2019-11-30 DIAGNOSIS — Z741 Need for assistance with personal care: Secondary | ICD-10-CM | POA: Diagnosis not present

## 2019-11-30 DIAGNOSIS — G43909 Migraine, unspecified, not intractable, without status migrainosus: Secondary | ICD-10-CM | POA: Diagnosis not present

## 2019-11-30 DIAGNOSIS — I6200 Nontraumatic subdural hemorrhage, unspecified: Secondary | ICD-10-CM | POA: Diagnosis not present

## 2019-11-30 DIAGNOSIS — M6281 Muscle weakness (generalized): Secondary | ICD-10-CM | POA: Diagnosis not present

## 2019-11-30 DIAGNOSIS — J9610 Chronic respiratory failure, unspecified whether with hypoxia or hypercapnia: Secondary | ICD-10-CM | POA: Diagnosis not present

## 2019-11-30 DIAGNOSIS — R262 Difficulty in walking, not elsewhere classified: Secondary | ICD-10-CM | POA: Diagnosis not present

## 2019-11-30 DIAGNOSIS — I10 Essential (primary) hypertension: Secondary | ICD-10-CM | POA: Diagnosis not present

## 2019-11-30 DIAGNOSIS — E782 Mixed hyperlipidemia: Secondary | ICD-10-CM | POA: Diagnosis not present

## 2019-11-30 DIAGNOSIS — R52 Pain, unspecified: Secondary | ICD-10-CM | POA: Diagnosis not present

## 2019-12-01 DIAGNOSIS — Z48811 Encounter for surgical aftercare following surgery on the nervous system: Secondary | ICD-10-CM | POA: Diagnosis not present

## 2019-12-01 DIAGNOSIS — R262 Difficulty in walking, not elsewhere classified: Secondary | ICD-10-CM | POA: Diagnosis not present

## 2019-12-01 DIAGNOSIS — Z741 Need for assistance with personal care: Secondary | ICD-10-CM | POA: Diagnosis not present

## 2019-12-01 DIAGNOSIS — I5032 Chronic diastolic (congestive) heart failure: Secondary | ICD-10-CM | POA: Diagnosis not present

## 2019-12-01 DIAGNOSIS — J449 Chronic obstructive pulmonary disease, unspecified: Secondary | ICD-10-CM | POA: Diagnosis not present

## 2019-12-01 DIAGNOSIS — M6281 Muscle weakness (generalized): Secondary | ICD-10-CM | POA: Diagnosis not present

## 2019-12-01 DIAGNOSIS — R278 Other lack of coordination: Secondary | ICD-10-CM | POA: Diagnosis not present

## 2019-12-02 DIAGNOSIS — R262 Difficulty in walking, not elsewhere classified: Secondary | ICD-10-CM | POA: Diagnosis not present

## 2019-12-02 DIAGNOSIS — Z741 Need for assistance with personal care: Secondary | ICD-10-CM | POA: Diagnosis not present

## 2019-12-02 DIAGNOSIS — M6281 Muscle weakness (generalized): Secondary | ICD-10-CM | POA: Diagnosis not present

## 2019-12-02 DIAGNOSIS — Z48811 Encounter for surgical aftercare following surgery on the nervous system: Secondary | ICD-10-CM | POA: Diagnosis not present

## 2019-12-02 DIAGNOSIS — J449 Chronic obstructive pulmonary disease, unspecified: Secondary | ICD-10-CM | POA: Diagnosis not present

## 2019-12-02 DIAGNOSIS — I5032 Chronic diastolic (congestive) heart failure: Secondary | ICD-10-CM | POA: Diagnosis not present

## 2019-12-02 DIAGNOSIS — R278 Other lack of coordination: Secondary | ICD-10-CM | POA: Diagnosis not present

## 2019-12-03 DIAGNOSIS — Z741 Need for assistance with personal care: Secondary | ICD-10-CM | POA: Diagnosis not present

## 2019-12-03 DIAGNOSIS — J449 Chronic obstructive pulmonary disease, unspecified: Secondary | ICD-10-CM | POA: Diagnosis not present

## 2019-12-03 DIAGNOSIS — R262 Difficulty in walking, not elsewhere classified: Secondary | ICD-10-CM | POA: Diagnosis not present

## 2019-12-03 DIAGNOSIS — I5032 Chronic diastolic (congestive) heart failure: Secondary | ICD-10-CM | POA: Diagnosis not present

## 2019-12-03 DIAGNOSIS — R278 Other lack of coordination: Secondary | ICD-10-CM | POA: Diagnosis not present

## 2019-12-03 DIAGNOSIS — M6281 Muscle weakness (generalized): Secondary | ICD-10-CM | POA: Diagnosis not present

## 2019-12-03 DIAGNOSIS — Z48811 Encounter for surgical aftercare following surgery on the nervous system: Secondary | ICD-10-CM | POA: Diagnosis not present

## 2019-12-04 DIAGNOSIS — J449 Chronic obstructive pulmonary disease, unspecified: Secondary | ICD-10-CM | POA: Diagnosis not present

## 2019-12-04 DIAGNOSIS — F4323 Adjustment disorder with mixed anxiety and depressed mood: Secondary | ICD-10-CM | POA: Diagnosis not present

## 2019-12-04 DIAGNOSIS — E559 Vitamin D deficiency, unspecified: Secondary | ICD-10-CM | POA: Diagnosis not present

## 2019-12-04 DIAGNOSIS — M6281 Muscle weakness (generalized): Secondary | ICD-10-CM | POA: Diagnosis not present

## 2019-12-04 DIAGNOSIS — Z741 Need for assistance with personal care: Secondary | ICD-10-CM | POA: Diagnosis not present

## 2019-12-04 DIAGNOSIS — R278 Other lack of coordination: Secondary | ICD-10-CM | POA: Diagnosis not present

## 2019-12-04 DIAGNOSIS — G4700 Insomnia, unspecified: Secondary | ICD-10-CM | POA: Diagnosis not present

## 2019-12-04 DIAGNOSIS — Z48811 Encounter for surgical aftercare following surgery on the nervous system: Secondary | ICD-10-CM | POA: Diagnosis not present

## 2019-12-04 DIAGNOSIS — I5032 Chronic diastolic (congestive) heart failure: Secondary | ICD-10-CM | POA: Diagnosis not present

## 2019-12-04 DIAGNOSIS — R262 Difficulty in walking, not elsewhere classified: Secondary | ICD-10-CM | POA: Diagnosis not present

## 2019-12-05 DIAGNOSIS — R0902 Hypoxemia: Secondary | ICD-10-CM | POA: Diagnosis not present

## 2019-12-05 DIAGNOSIS — J9601 Acute respiratory failure with hypoxia: Secondary | ICD-10-CM | POA: Diagnosis not present

## 2019-12-05 DIAGNOSIS — J45909 Unspecified asthma, uncomplicated: Secondary | ICD-10-CM | POA: Diagnosis not present

## 2019-12-06 DIAGNOSIS — J449 Chronic obstructive pulmonary disease, unspecified: Secondary | ICD-10-CM | POA: Diagnosis not present

## 2019-12-06 DIAGNOSIS — Z48811 Encounter for surgical aftercare following surgery on the nervous system: Secondary | ICD-10-CM | POA: Diagnosis not present

## 2019-12-06 DIAGNOSIS — R278 Other lack of coordination: Secondary | ICD-10-CM | POA: Diagnosis not present

## 2019-12-06 DIAGNOSIS — Z741 Need for assistance with personal care: Secondary | ICD-10-CM | POA: Diagnosis not present

## 2019-12-06 DIAGNOSIS — I5032 Chronic diastolic (congestive) heart failure: Secondary | ICD-10-CM | POA: Diagnosis not present

## 2019-12-06 DIAGNOSIS — M6281 Muscle weakness (generalized): Secondary | ICD-10-CM | POA: Diagnosis not present

## 2019-12-06 DIAGNOSIS — R262 Difficulty in walking, not elsewhere classified: Secondary | ICD-10-CM | POA: Diagnosis not present

## 2019-12-08 DIAGNOSIS — Z48811 Encounter for surgical aftercare following surgery on the nervous system: Secondary | ICD-10-CM | POA: Diagnosis not present

## 2019-12-08 DIAGNOSIS — R2681 Unsteadiness on feet: Secondary | ICD-10-CM | POA: Diagnosis not present

## 2019-12-08 DIAGNOSIS — M6281 Muscle weakness (generalized): Secondary | ICD-10-CM | POA: Diagnosis not present

## 2019-12-08 DIAGNOSIS — I5032 Chronic diastolic (congestive) heart failure: Secondary | ICD-10-CM | POA: Diagnosis not present

## 2019-12-08 DIAGNOSIS — W19XXXD Unspecified fall, subsequent encounter: Secondary | ICD-10-CM | POA: Diagnosis not present

## 2019-12-08 DIAGNOSIS — E782 Mixed hyperlipidemia: Secondary | ICD-10-CM | POA: Diagnosis not present

## 2019-12-08 DIAGNOSIS — R262 Difficulty in walking, not elsewhere classified: Secondary | ICD-10-CM | POA: Diagnosis not present

## 2019-12-08 DIAGNOSIS — I10 Essential (primary) hypertension: Secondary | ICD-10-CM | POA: Diagnosis not present

## 2019-12-08 DIAGNOSIS — J9610 Chronic respiratory failure, unspecified whether with hypoxia or hypercapnia: Secondary | ICD-10-CM | POA: Diagnosis not present

## 2019-12-08 DIAGNOSIS — R278 Other lack of coordination: Secondary | ICD-10-CM | POA: Diagnosis not present

## 2019-12-08 DIAGNOSIS — Z741 Need for assistance with personal care: Secondary | ICD-10-CM | POA: Diagnosis not present

## 2019-12-08 DIAGNOSIS — M545 Low back pain, unspecified: Secondary | ICD-10-CM | POA: Diagnosis not present

## 2019-12-08 DIAGNOSIS — R52 Pain, unspecified: Secondary | ICD-10-CM | POA: Diagnosis not present

## 2019-12-08 DIAGNOSIS — J449 Chronic obstructive pulmonary disease, unspecified: Secondary | ICD-10-CM | POA: Diagnosis not present

## 2019-12-09 DIAGNOSIS — W19XXXD Unspecified fall, subsequent encounter: Secondary | ICD-10-CM | POA: Diagnosis not present

## 2019-12-09 DIAGNOSIS — R278 Other lack of coordination: Secondary | ICD-10-CM | POA: Diagnosis not present

## 2019-12-09 DIAGNOSIS — Z48811 Encounter for surgical aftercare following surgery on the nervous system: Secondary | ICD-10-CM | POA: Diagnosis not present

## 2019-12-09 DIAGNOSIS — Z741 Need for assistance with personal care: Secondary | ICD-10-CM | POA: Diagnosis not present

## 2019-12-09 DIAGNOSIS — L89896 Pressure-induced deep tissue damage of other site: Secondary | ICD-10-CM | POA: Diagnosis not present

## 2019-12-09 DIAGNOSIS — M6281 Muscle weakness (generalized): Secondary | ICD-10-CM | POA: Diagnosis not present

## 2019-12-09 DIAGNOSIS — J449 Chronic obstructive pulmonary disease, unspecified: Secondary | ICD-10-CM | POA: Diagnosis not present

## 2019-12-09 DIAGNOSIS — I5032 Chronic diastolic (congestive) heart failure: Secondary | ICD-10-CM | POA: Diagnosis not present

## 2019-12-09 DIAGNOSIS — M545 Low back pain, unspecified: Secondary | ICD-10-CM | POA: Diagnosis not present

## 2019-12-10 DIAGNOSIS — Z741 Need for assistance with personal care: Secondary | ICD-10-CM | POA: Diagnosis not present

## 2019-12-10 DIAGNOSIS — J449 Chronic obstructive pulmonary disease, unspecified: Secondary | ICD-10-CM | POA: Diagnosis not present

## 2019-12-10 DIAGNOSIS — I5032 Chronic diastolic (congestive) heart failure: Secondary | ICD-10-CM | POA: Diagnosis not present

## 2019-12-10 DIAGNOSIS — M6281 Muscle weakness (generalized): Secondary | ICD-10-CM | POA: Diagnosis not present

## 2019-12-10 DIAGNOSIS — R278 Other lack of coordination: Secondary | ICD-10-CM | POA: Diagnosis not present

## 2019-12-10 DIAGNOSIS — W19XXXD Unspecified fall, subsequent encounter: Secondary | ICD-10-CM | POA: Diagnosis not present

## 2019-12-10 DIAGNOSIS — Z48811 Encounter for surgical aftercare following surgery on the nervous system: Secondary | ICD-10-CM | POA: Diagnosis not present

## 2019-12-10 DIAGNOSIS — M545 Low back pain, unspecified: Secondary | ICD-10-CM | POA: Diagnosis not present

## 2019-12-11 DIAGNOSIS — Z48811 Encounter for surgical aftercare following surgery on the nervous system: Secondary | ICD-10-CM | POA: Diagnosis not present

## 2019-12-11 DIAGNOSIS — J449 Chronic obstructive pulmonary disease, unspecified: Secondary | ICD-10-CM | POA: Diagnosis not present

## 2019-12-11 DIAGNOSIS — Z741 Need for assistance with personal care: Secondary | ICD-10-CM | POA: Diagnosis not present

## 2019-12-11 DIAGNOSIS — W19XXXD Unspecified fall, subsequent encounter: Secondary | ICD-10-CM | POA: Diagnosis not present

## 2019-12-11 DIAGNOSIS — I5032 Chronic diastolic (congestive) heart failure: Secondary | ICD-10-CM | POA: Diagnosis not present

## 2019-12-11 DIAGNOSIS — R278 Other lack of coordination: Secondary | ICD-10-CM | POA: Diagnosis not present

## 2019-12-11 DIAGNOSIS — M545 Low back pain, unspecified: Secondary | ICD-10-CM | POA: Diagnosis not present

## 2019-12-11 DIAGNOSIS — M6281 Muscle weakness (generalized): Secondary | ICD-10-CM | POA: Diagnosis not present

## 2019-12-12 DIAGNOSIS — M6281 Muscle weakness (generalized): Secondary | ICD-10-CM | POA: Diagnosis not present

## 2019-12-12 DIAGNOSIS — J449 Chronic obstructive pulmonary disease, unspecified: Secondary | ICD-10-CM | POA: Diagnosis not present

## 2019-12-12 DIAGNOSIS — I5032 Chronic diastolic (congestive) heart failure: Secondary | ICD-10-CM | POA: Diagnosis not present

## 2019-12-12 DIAGNOSIS — Z741 Need for assistance with personal care: Secondary | ICD-10-CM | POA: Diagnosis not present

## 2019-12-12 DIAGNOSIS — R278 Other lack of coordination: Secondary | ICD-10-CM | POA: Diagnosis not present

## 2019-12-12 DIAGNOSIS — W19XXXD Unspecified fall, subsequent encounter: Secondary | ICD-10-CM | POA: Diagnosis not present

## 2019-12-12 DIAGNOSIS — M545 Low back pain, unspecified: Secondary | ICD-10-CM | POA: Diagnosis not present

## 2019-12-12 DIAGNOSIS — Z48811 Encounter for surgical aftercare following surgery on the nervous system: Secondary | ICD-10-CM | POA: Diagnosis not present

## 2019-12-13 DIAGNOSIS — Z741 Need for assistance with personal care: Secondary | ICD-10-CM | POA: Diagnosis not present

## 2019-12-13 DIAGNOSIS — M545 Low back pain, unspecified: Secondary | ICD-10-CM | POA: Diagnosis not present

## 2019-12-13 DIAGNOSIS — Z48811 Encounter for surgical aftercare following surgery on the nervous system: Secondary | ICD-10-CM | POA: Diagnosis not present

## 2019-12-13 DIAGNOSIS — R278 Other lack of coordination: Secondary | ICD-10-CM | POA: Diagnosis not present

## 2019-12-13 DIAGNOSIS — M6281 Muscle weakness (generalized): Secondary | ICD-10-CM | POA: Diagnosis not present

## 2019-12-13 DIAGNOSIS — J449 Chronic obstructive pulmonary disease, unspecified: Secondary | ICD-10-CM | POA: Diagnosis not present

## 2019-12-13 DIAGNOSIS — I5032 Chronic diastolic (congestive) heart failure: Secondary | ICD-10-CM | POA: Diagnosis not present

## 2019-12-13 DIAGNOSIS — W19XXXD Unspecified fall, subsequent encounter: Secondary | ICD-10-CM | POA: Diagnosis not present

## 2019-12-14 DIAGNOSIS — W19XXXD Unspecified fall, subsequent encounter: Secondary | ICD-10-CM | POA: Diagnosis not present

## 2019-12-14 DIAGNOSIS — R278 Other lack of coordination: Secondary | ICD-10-CM | POA: Diagnosis not present

## 2019-12-14 DIAGNOSIS — J449 Chronic obstructive pulmonary disease, unspecified: Secondary | ICD-10-CM | POA: Diagnosis not present

## 2019-12-14 DIAGNOSIS — M545 Low back pain, unspecified: Secondary | ICD-10-CM | POA: Diagnosis not present

## 2019-12-14 DIAGNOSIS — I5032 Chronic diastolic (congestive) heart failure: Secondary | ICD-10-CM | POA: Diagnosis not present

## 2019-12-14 DIAGNOSIS — Z48811 Encounter for surgical aftercare following surgery on the nervous system: Secondary | ICD-10-CM | POA: Diagnosis not present

## 2019-12-14 DIAGNOSIS — M6281 Muscle weakness (generalized): Secondary | ICD-10-CM | POA: Diagnosis not present

## 2019-12-14 DIAGNOSIS — Z741 Need for assistance with personal care: Secondary | ICD-10-CM | POA: Diagnosis not present

## 2019-12-15 DIAGNOSIS — R262 Difficulty in walking, not elsewhere classified: Secondary | ICD-10-CM | POA: Diagnosis not present

## 2019-12-15 DIAGNOSIS — R52 Pain, unspecified: Secondary | ICD-10-CM | POA: Diagnosis not present

## 2019-12-15 DIAGNOSIS — R2681 Unsteadiness on feet: Secondary | ICD-10-CM | POA: Diagnosis not present

## 2019-12-15 DIAGNOSIS — I10 Essential (primary) hypertension: Secondary | ICD-10-CM | POA: Diagnosis not present

## 2019-12-15 DIAGNOSIS — Z48811 Encounter for surgical aftercare following surgery on the nervous system: Secondary | ICD-10-CM | POA: Diagnosis not present

## 2019-12-15 DIAGNOSIS — W19XXXD Unspecified fall, subsequent encounter: Secondary | ICD-10-CM | POA: Diagnosis not present

## 2019-12-15 DIAGNOSIS — J9610 Chronic respiratory failure, unspecified whether with hypoxia or hypercapnia: Secondary | ICD-10-CM | POA: Diagnosis not present

## 2019-12-15 DIAGNOSIS — R278 Other lack of coordination: Secondary | ICD-10-CM | POA: Diagnosis not present

## 2019-12-15 DIAGNOSIS — M545 Low back pain, unspecified: Secondary | ICD-10-CM | POA: Diagnosis not present

## 2019-12-15 DIAGNOSIS — I5032 Chronic diastolic (congestive) heart failure: Secondary | ICD-10-CM | POA: Diagnosis not present

## 2019-12-15 DIAGNOSIS — E782 Mixed hyperlipidemia: Secondary | ICD-10-CM | POA: Diagnosis not present

## 2019-12-15 DIAGNOSIS — M6281 Muscle weakness (generalized): Secondary | ICD-10-CM | POA: Diagnosis not present

## 2019-12-15 DIAGNOSIS — Z741 Need for assistance with personal care: Secondary | ICD-10-CM | POA: Diagnosis not present

## 2019-12-15 DIAGNOSIS — J449 Chronic obstructive pulmonary disease, unspecified: Secondary | ICD-10-CM | POA: Diagnosis not present

## 2019-12-16 DIAGNOSIS — S92502A Displaced unspecified fracture of left lesser toe(s), initial encounter for closed fracture: Secondary | ICD-10-CM | POA: Diagnosis not present

## 2019-12-16 DIAGNOSIS — M545 Low back pain, unspecified: Secondary | ICD-10-CM | POA: Diagnosis not present

## 2019-12-16 DIAGNOSIS — M6281 Muscle weakness (generalized): Secondary | ICD-10-CM | POA: Diagnosis not present

## 2019-12-16 DIAGNOSIS — Z741 Need for assistance with personal care: Secondary | ICD-10-CM | POA: Diagnosis not present

## 2019-12-16 DIAGNOSIS — Z48811 Encounter for surgical aftercare following surgery on the nervous system: Secondary | ICD-10-CM | POA: Diagnosis not present

## 2019-12-16 DIAGNOSIS — S92515A Nondisplaced fracture of proximal phalanx of left lesser toe(s), initial encounter for closed fracture: Secondary | ICD-10-CM | POA: Diagnosis not present

## 2019-12-16 DIAGNOSIS — I5032 Chronic diastolic (congestive) heart failure: Secondary | ICD-10-CM | POA: Diagnosis not present

## 2019-12-16 DIAGNOSIS — S91319A Laceration without foreign body, unspecified foot, initial encounter: Secondary | ICD-10-CM | POA: Diagnosis not present

## 2019-12-16 DIAGNOSIS — W19XXXD Unspecified fall, subsequent encounter: Secondary | ICD-10-CM | POA: Diagnosis not present

## 2019-12-16 DIAGNOSIS — J449 Chronic obstructive pulmonary disease, unspecified: Secondary | ICD-10-CM | POA: Diagnosis not present

## 2019-12-16 DIAGNOSIS — R278 Other lack of coordination: Secondary | ICD-10-CM | POA: Diagnosis not present

## 2019-12-17 DIAGNOSIS — W19XXXD Unspecified fall, subsequent encounter: Secondary | ICD-10-CM | POA: Diagnosis not present

## 2019-12-17 DIAGNOSIS — J9610 Chronic respiratory failure, unspecified whether with hypoxia or hypercapnia: Secondary | ICD-10-CM | POA: Diagnosis not present

## 2019-12-17 DIAGNOSIS — J449 Chronic obstructive pulmonary disease, unspecified: Secondary | ICD-10-CM | POA: Diagnosis not present

## 2019-12-17 DIAGNOSIS — R278 Other lack of coordination: Secondary | ICD-10-CM | POA: Diagnosis not present

## 2019-12-17 DIAGNOSIS — R52 Pain, unspecified: Secondary | ICD-10-CM | POA: Diagnosis not present

## 2019-12-17 DIAGNOSIS — I10 Essential (primary) hypertension: Secondary | ICD-10-CM | POA: Diagnosis not present

## 2019-12-17 DIAGNOSIS — M6281 Muscle weakness (generalized): Secondary | ICD-10-CM | POA: Diagnosis not present

## 2019-12-17 DIAGNOSIS — Z741 Need for assistance with personal care: Secondary | ICD-10-CM | POA: Diagnosis not present

## 2019-12-17 DIAGNOSIS — S92502A Displaced unspecified fracture of left lesser toe(s), initial encounter for closed fracture: Secondary | ICD-10-CM | POA: Diagnosis not present

## 2019-12-17 DIAGNOSIS — R262 Difficulty in walking, not elsewhere classified: Secondary | ICD-10-CM | POA: Diagnosis not present

## 2019-12-17 DIAGNOSIS — M545 Low back pain, unspecified: Secondary | ICD-10-CM | POA: Diagnosis not present

## 2019-12-17 DIAGNOSIS — I5032 Chronic diastolic (congestive) heart failure: Secondary | ICD-10-CM | POA: Diagnosis not present

## 2019-12-17 DIAGNOSIS — E782 Mixed hyperlipidemia: Secondary | ICD-10-CM | POA: Diagnosis not present

## 2019-12-17 DIAGNOSIS — Z48811 Encounter for surgical aftercare following surgery on the nervous system: Secondary | ICD-10-CM | POA: Diagnosis not present

## 2019-12-17 DIAGNOSIS — R2681 Unsteadiness on feet: Secondary | ICD-10-CM | POA: Diagnosis not present

## 2019-12-18 DIAGNOSIS — Z741 Need for assistance with personal care: Secondary | ICD-10-CM | POA: Diagnosis not present

## 2019-12-18 DIAGNOSIS — Z03818 Encounter for observation for suspected exposure to other biological agents ruled out: Secondary | ICD-10-CM | POA: Diagnosis not present

## 2019-12-18 DIAGNOSIS — M545 Low back pain, unspecified: Secondary | ICD-10-CM | POA: Diagnosis not present

## 2019-12-18 DIAGNOSIS — I5032 Chronic diastolic (congestive) heart failure: Secondary | ICD-10-CM | POA: Diagnosis not present

## 2019-12-18 DIAGNOSIS — J449 Chronic obstructive pulmonary disease, unspecified: Secondary | ICD-10-CM | POA: Diagnosis not present

## 2019-12-18 DIAGNOSIS — W19XXXD Unspecified fall, subsequent encounter: Secondary | ICD-10-CM | POA: Diagnosis not present

## 2019-12-18 DIAGNOSIS — Z48811 Encounter for surgical aftercare following surgery on the nervous system: Secondary | ICD-10-CM | POA: Diagnosis not present

## 2019-12-18 DIAGNOSIS — R278 Other lack of coordination: Secondary | ICD-10-CM | POA: Diagnosis not present

## 2019-12-18 DIAGNOSIS — M6281 Muscle weakness (generalized): Secondary | ICD-10-CM | POA: Diagnosis not present

## 2019-12-19 DIAGNOSIS — R278 Other lack of coordination: Secondary | ICD-10-CM | POA: Diagnosis not present

## 2019-12-19 DIAGNOSIS — M545 Low back pain, unspecified: Secondary | ICD-10-CM | POA: Diagnosis not present

## 2019-12-19 DIAGNOSIS — M6281 Muscle weakness (generalized): Secondary | ICD-10-CM | POA: Diagnosis not present

## 2019-12-19 DIAGNOSIS — I5032 Chronic diastolic (congestive) heart failure: Secondary | ICD-10-CM | POA: Diagnosis not present

## 2019-12-19 DIAGNOSIS — W19XXXD Unspecified fall, subsequent encounter: Secondary | ICD-10-CM | POA: Diagnosis not present

## 2019-12-19 DIAGNOSIS — Z48811 Encounter for surgical aftercare following surgery on the nervous system: Secondary | ICD-10-CM | POA: Diagnosis not present

## 2019-12-19 DIAGNOSIS — Z741 Need for assistance with personal care: Secondary | ICD-10-CM | POA: Diagnosis not present

## 2019-12-19 DIAGNOSIS — J449 Chronic obstructive pulmonary disease, unspecified: Secondary | ICD-10-CM | POA: Diagnosis not present

## 2019-12-20 DIAGNOSIS — Z741 Need for assistance with personal care: Secondary | ICD-10-CM | POA: Diagnosis not present

## 2019-12-20 DIAGNOSIS — M545 Low back pain, unspecified: Secondary | ICD-10-CM | POA: Diagnosis not present

## 2019-12-20 DIAGNOSIS — Z48811 Encounter for surgical aftercare following surgery on the nervous system: Secondary | ICD-10-CM | POA: Diagnosis not present

## 2019-12-20 DIAGNOSIS — J449 Chronic obstructive pulmonary disease, unspecified: Secondary | ICD-10-CM | POA: Diagnosis not present

## 2019-12-20 DIAGNOSIS — R278 Other lack of coordination: Secondary | ICD-10-CM | POA: Diagnosis not present

## 2019-12-20 DIAGNOSIS — I5032 Chronic diastolic (congestive) heart failure: Secondary | ICD-10-CM | POA: Diagnosis not present

## 2019-12-20 DIAGNOSIS — W19XXXD Unspecified fall, subsequent encounter: Secondary | ICD-10-CM | POA: Diagnosis not present

## 2019-12-20 DIAGNOSIS — M6281 Muscle weakness (generalized): Secondary | ICD-10-CM | POA: Diagnosis not present

## 2019-12-21 DIAGNOSIS — W19XXXD Unspecified fall, subsequent encounter: Secondary | ICD-10-CM | POA: Diagnosis not present

## 2019-12-21 DIAGNOSIS — M6281 Muscle weakness (generalized): Secondary | ICD-10-CM | POA: Diagnosis not present

## 2019-12-21 DIAGNOSIS — J449 Chronic obstructive pulmonary disease, unspecified: Secondary | ICD-10-CM | POA: Diagnosis not present

## 2019-12-21 DIAGNOSIS — Z741 Need for assistance with personal care: Secondary | ICD-10-CM | POA: Diagnosis not present

## 2019-12-21 DIAGNOSIS — Z48811 Encounter for surgical aftercare following surgery on the nervous system: Secondary | ICD-10-CM | POA: Diagnosis not present

## 2019-12-21 DIAGNOSIS — I5032 Chronic diastolic (congestive) heart failure: Secondary | ICD-10-CM | POA: Diagnosis not present

## 2019-12-21 DIAGNOSIS — M545 Low back pain, unspecified: Secondary | ICD-10-CM | POA: Diagnosis not present

## 2019-12-21 DIAGNOSIS — R278 Other lack of coordination: Secondary | ICD-10-CM | POA: Diagnosis not present

## 2019-12-22 DIAGNOSIS — S92502A Displaced unspecified fracture of left lesser toe(s), initial encounter for closed fracture: Secondary | ICD-10-CM | POA: Diagnosis not present

## 2019-12-22 DIAGNOSIS — F419 Anxiety disorder, unspecified: Secondary | ICD-10-CM | POA: Diagnosis not present

## 2019-12-22 DIAGNOSIS — J449 Chronic obstructive pulmonary disease, unspecified: Secondary | ICD-10-CM | POA: Diagnosis not present

## 2019-12-22 DIAGNOSIS — I5032 Chronic diastolic (congestive) heart failure: Secondary | ICD-10-CM | POA: Diagnosis not present

## 2019-12-22 DIAGNOSIS — W19XXXD Unspecified fall, subsequent encounter: Secondary | ICD-10-CM | POA: Diagnosis not present

## 2019-12-22 DIAGNOSIS — M545 Low back pain, unspecified: Secondary | ICD-10-CM | POA: Diagnosis not present

## 2019-12-22 DIAGNOSIS — L89896 Pressure-induced deep tissue damage of other site: Secondary | ICD-10-CM | POA: Diagnosis not present

## 2019-12-22 DIAGNOSIS — Z48811 Encounter for surgical aftercare following surgery on the nervous system: Secondary | ICD-10-CM | POA: Diagnosis not present

## 2019-12-22 DIAGNOSIS — M6281 Muscle weakness (generalized): Secondary | ICD-10-CM | POA: Diagnosis not present

## 2019-12-22 DIAGNOSIS — R278 Other lack of coordination: Secondary | ICD-10-CM | POA: Diagnosis not present

## 2019-12-22 DIAGNOSIS — Z741 Need for assistance with personal care: Secondary | ICD-10-CM | POA: Diagnosis not present

## 2019-12-23 DIAGNOSIS — W19XXXD Unspecified fall, subsequent encounter: Secondary | ICD-10-CM | POA: Diagnosis not present

## 2019-12-23 DIAGNOSIS — Z48811 Encounter for surgical aftercare following surgery on the nervous system: Secondary | ICD-10-CM | POA: Diagnosis not present

## 2019-12-23 DIAGNOSIS — M545 Low back pain, unspecified: Secondary | ICD-10-CM | POA: Diagnosis not present

## 2019-12-23 DIAGNOSIS — M6281 Muscle weakness (generalized): Secondary | ICD-10-CM | POA: Diagnosis not present

## 2019-12-23 DIAGNOSIS — J449 Chronic obstructive pulmonary disease, unspecified: Secondary | ICD-10-CM | POA: Diagnosis not present

## 2019-12-23 DIAGNOSIS — Z741 Need for assistance with personal care: Secondary | ICD-10-CM | POA: Diagnosis not present

## 2019-12-23 DIAGNOSIS — R278 Other lack of coordination: Secondary | ICD-10-CM | POA: Diagnosis not present

## 2019-12-23 DIAGNOSIS — I5032 Chronic diastolic (congestive) heart failure: Secondary | ICD-10-CM | POA: Diagnosis not present

## 2019-12-24 DIAGNOSIS — E782 Mixed hyperlipidemia: Secondary | ICD-10-CM | POA: Diagnosis not present

## 2019-12-24 DIAGNOSIS — R278 Other lack of coordination: Secondary | ICD-10-CM | POA: Diagnosis not present

## 2019-12-24 DIAGNOSIS — R2681 Unsteadiness on feet: Secondary | ICD-10-CM | POA: Diagnosis not present

## 2019-12-24 DIAGNOSIS — Z741 Need for assistance with personal care: Secondary | ICD-10-CM | POA: Diagnosis not present

## 2019-12-24 DIAGNOSIS — R262 Difficulty in walking, not elsewhere classified: Secondary | ICD-10-CM | POA: Diagnosis not present

## 2019-12-24 DIAGNOSIS — I10 Essential (primary) hypertension: Secondary | ICD-10-CM | POA: Diagnosis not present

## 2019-12-24 DIAGNOSIS — R52 Pain, unspecified: Secondary | ICD-10-CM | POA: Diagnosis not present

## 2019-12-24 DIAGNOSIS — M6281 Muscle weakness (generalized): Secondary | ICD-10-CM | POA: Diagnosis not present

## 2019-12-24 DIAGNOSIS — M545 Low back pain, unspecified: Secondary | ICD-10-CM | POA: Diagnosis not present

## 2019-12-24 DIAGNOSIS — I5032 Chronic diastolic (congestive) heart failure: Secondary | ICD-10-CM | POA: Diagnosis not present

## 2019-12-24 DIAGNOSIS — J449 Chronic obstructive pulmonary disease, unspecified: Secondary | ICD-10-CM | POA: Diagnosis not present

## 2019-12-24 DIAGNOSIS — J9610 Chronic respiratory failure, unspecified whether with hypoxia or hypercapnia: Secondary | ICD-10-CM | POA: Diagnosis not present

## 2019-12-24 DIAGNOSIS — Z48811 Encounter for surgical aftercare following surgery on the nervous system: Secondary | ICD-10-CM | POA: Diagnosis not present

## 2019-12-24 DIAGNOSIS — W19XXXD Unspecified fall, subsequent encounter: Secondary | ICD-10-CM | POA: Diagnosis not present

## 2019-12-25 DIAGNOSIS — M6281 Muscle weakness (generalized): Secondary | ICD-10-CM | POA: Diagnosis not present

## 2019-12-25 DIAGNOSIS — Z20828 Contact with and (suspected) exposure to other viral communicable diseases: Secondary | ICD-10-CM | POA: Diagnosis not present

## 2019-12-25 DIAGNOSIS — Z741 Need for assistance with personal care: Secondary | ICD-10-CM | POA: Diagnosis not present

## 2019-12-25 DIAGNOSIS — R278 Other lack of coordination: Secondary | ICD-10-CM | POA: Diagnosis not present

## 2019-12-25 DIAGNOSIS — J449 Chronic obstructive pulmonary disease, unspecified: Secondary | ICD-10-CM | POA: Diagnosis not present

## 2019-12-25 DIAGNOSIS — M545 Low back pain, unspecified: Secondary | ICD-10-CM | POA: Diagnosis not present

## 2019-12-25 DIAGNOSIS — Z48811 Encounter for surgical aftercare following surgery on the nervous system: Secondary | ICD-10-CM | POA: Diagnosis not present

## 2019-12-25 DIAGNOSIS — W19XXXD Unspecified fall, subsequent encounter: Secondary | ICD-10-CM | POA: Diagnosis not present

## 2019-12-25 DIAGNOSIS — I5032 Chronic diastolic (congestive) heart failure: Secondary | ICD-10-CM | POA: Diagnosis not present

## 2019-12-26 DIAGNOSIS — Z741 Need for assistance with personal care: Secondary | ICD-10-CM | POA: Diagnosis not present

## 2019-12-26 DIAGNOSIS — I5032 Chronic diastolic (congestive) heart failure: Secondary | ICD-10-CM | POA: Diagnosis not present

## 2019-12-26 DIAGNOSIS — J449 Chronic obstructive pulmonary disease, unspecified: Secondary | ICD-10-CM | POA: Diagnosis not present

## 2019-12-26 DIAGNOSIS — Z48811 Encounter for surgical aftercare following surgery on the nervous system: Secondary | ICD-10-CM | POA: Diagnosis not present

## 2019-12-26 DIAGNOSIS — R278 Other lack of coordination: Secondary | ICD-10-CM | POA: Diagnosis not present

## 2019-12-26 DIAGNOSIS — W19XXXD Unspecified fall, subsequent encounter: Secondary | ICD-10-CM | POA: Diagnosis not present

## 2019-12-26 DIAGNOSIS — M545 Low back pain, unspecified: Secondary | ICD-10-CM | POA: Diagnosis not present

## 2019-12-26 DIAGNOSIS — M6281 Muscle weakness (generalized): Secondary | ICD-10-CM | POA: Diagnosis not present

## 2019-12-27 DIAGNOSIS — Z741 Need for assistance with personal care: Secondary | ICD-10-CM | POA: Diagnosis not present

## 2019-12-27 DIAGNOSIS — R278 Other lack of coordination: Secondary | ICD-10-CM | POA: Diagnosis not present

## 2019-12-27 DIAGNOSIS — M6281 Muscle weakness (generalized): Secondary | ICD-10-CM | POA: Diagnosis not present

## 2019-12-27 DIAGNOSIS — M545 Low back pain, unspecified: Secondary | ICD-10-CM | POA: Diagnosis not present

## 2019-12-27 DIAGNOSIS — I5032 Chronic diastolic (congestive) heart failure: Secondary | ICD-10-CM | POA: Diagnosis not present

## 2019-12-27 DIAGNOSIS — J449 Chronic obstructive pulmonary disease, unspecified: Secondary | ICD-10-CM | POA: Diagnosis not present

## 2019-12-27 DIAGNOSIS — Z48811 Encounter for surgical aftercare following surgery on the nervous system: Secondary | ICD-10-CM | POA: Diagnosis not present

## 2019-12-27 DIAGNOSIS — W19XXXD Unspecified fall, subsequent encounter: Secondary | ICD-10-CM | POA: Diagnosis not present

## 2019-12-28 DIAGNOSIS — Z741 Need for assistance with personal care: Secondary | ICD-10-CM | POA: Diagnosis not present

## 2019-12-28 DIAGNOSIS — J449 Chronic obstructive pulmonary disease, unspecified: Secondary | ICD-10-CM | POA: Diagnosis not present

## 2019-12-28 DIAGNOSIS — Z48811 Encounter for surgical aftercare following surgery on the nervous system: Secondary | ICD-10-CM | POA: Diagnosis not present

## 2019-12-28 DIAGNOSIS — R278 Other lack of coordination: Secondary | ICD-10-CM | POA: Diagnosis not present

## 2019-12-28 DIAGNOSIS — M545 Low back pain, unspecified: Secondary | ICD-10-CM | POA: Diagnosis not present

## 2019-12-28 DIAGNOSIS — I5032 Chronic diastolic (congestive) heart failure: Secondary | ICD-10-CM | POA: Diagnosis not present

## 2019-12-28 DIAGNOSIS — M6281 Muscle weakness (generalized): Secondary | ICD-10-CM | POA: Diagnosis not present

## 2019-12-28 DIAGNOSIS — W19XXXD Unspecified fall, subsequent encounter: Secondary | ICD-10-CM | POA: Diagnosis not present

## 2019-12-29 DIAGNOSIS — J9610 Chronic respiratory failure, unspecified whether with hypoxia or hypercapnia: Secondary | ICD-10-CM | POA: Diagnosis not present

## 2019-12-29 DIAGNOSIS — R52 Pain, unspecified: Secondary | ICD-10-CM | POA: Diagnosis not present

## 2019-12-29 DIAGNOSIS — Z741 Need for assistance with personal care: Secondary | ICD-10-CM | POA: Diagnosis not present

## 2019-12-29 DIAGNOSIS — R2681 Unsteadiness on feet: Secondary | ICD-10-CM | POA: Diagnosis not present

## 2019-12-29 DIAGNOSIS — Z48811 Encounter for surgical aftercare following surgery on the nervous system: Secondary | ICD-10-CM | POA: Diagnosis not present

## 2019-12-29 DIAGNOSIS — L8995 Pressure ulcer of unspecified site, unstageable: Secondary | ICD-10-CM | POA: Diagnosis not present

## 2019-12-29 DIAGNOSIS — M545 Low back pain, unspecified: Secondary | ICD-10-CM | POA: Diagnosis not present

## 2019-12-29 DIAGNOSIS — M6281 Muscle weakness (generalized): Secondary | ICD-10-CM | POA: Diagnosis not present

## 2019-12-29 DIAGNOSIS — R262 Difficulty in walking, not elsewhere classified: Secondary | ICD-10-CM | POA: Diagnosis not present

## 2019-12-29 DIAGNOSIS — J449 Chronic obstructive pulmonary disease, unspecified: Secondary | ICD-10-CM | POA: Diagnosis not present

## 2019-12-29 DIAGNOSIS — I10 Essential (primary) hypertension: Secondary | ICD-10-CM | POA: Diagnosis not present

## 2019-12-29 DIAGNOSIS — E782 Mixed hyperlipidemia: Secondary | ICD-10-CM | POA: Diagnosis not present

## 2019-12-29 DIAGNOSIS — R278 Other lack of coordination: Secondary | ICD-10-CM | POA: Diagnosis not present

## 2019-12-29 DIAGNOSIS — W19XXXD Unspecified fall, subsequent encounter: Secondary | ICD-10-CM | POA: Diagnosis not present

## 2019-12-29 DIAGNOSIS — I5032 Chronic diastolic (congestive) heart failure: Secondary | ICD-10-CM | POA: Diagnosis not present

## 2019-12-30 DIAGNOSIS — I5032 Chronic diastolic (congestive) heart failure: Secondary | ICD-10-CM | POA: Diagnosis not present

## 2019-12-30 DIAGNOSIS — M545 Low back pain, unspecified: Secondary | ICD-10-CM | POA: Diagnosis not present

## 2019-12-30 DIAGNOSIS — W19XXXD Unspecified fall, subsequent encounter: Secondary | ICD-10-CM | POA: Diagnosis not present

## 2019-12-30 DIAGNOSIS — R278 Other lack of coordination: Secondary | ICD-10-CM | POA: Diagnosis not present

## 2019-12-30 DIAGNOSIS — M6281 Muscle weakness (generalized): Secondary | ICD-10-CM | POA: Diagnosis not present

## 2019-12-30 DIAGNOSIS — J449 Chronic obstructive pulmonary disease, unspecified: Secondary | ICD-10-CM | POA: Diagnosis not present

## 2019-12-30 DIAGNOSIS — Z741 Need for assistance with personal care: Secondary | ICD-10-CM | POA: Diagnosis not present

## 2019-12-30 DIAGNOSIS — Z48811 Encounter for surgical aftercare following surgery on the nervous system: Secondary | ICD-10-CM | POA: Diagnosis not present

## 2019-12-31 DIAGNOSIS — M545 Low back pain, unspecified: Secondary | ICD-10-CM | POA: Diagnosis not present

## 2019-12-31 DIAGNOSIS — I5032 Chronic diastolic (congestive) heart failure: Secondary | ICD-10-CM | POA: Diagnosis not present

## 2019-12-31 DIAGNOSIS — Z741 Need for assistance with personal care: Secondary | ICD-10-CM | POA: Diagnosis not present

## 2019-12-31 DIAGNOSIS — R278 Other lack of coordination: Secondary | ICD-10-CM | POA: Diagnosis not present

## 2019-12-31 DIAGNOSIS — M6281 Muscle weakness (generalized): Secondary | ICD-10-CM | POA: Diagnosis not present

## 2019-12-31 DIAGNOSIS — J449 Chronic obstructive pulmonary disease, unspecified: Secondary | ICD-10-CM | POA: Diagnosis not present

## 2019-12-31 DIAGNOSIS — W19XXXD Unspecified fall, subsequent encounter: Secondary | ICD-10-CM | POA: Diagnosis not present

## 2019-12-31 DIAGNOSIS — Z48811 Encounter for surgical aftercare following surgery on the nervous system: Secondary | ICD-10-CM | POA: Diagnosis not present

## 2020-01-01 DIAGNOSIS — M545 Low back pain, unspecified: Secondary | ICD-10-CM | POA: Diagnosis not present

## 2020-01-01 DIAGNOSIS — W19XXXD Unspecified fall, subsequent encounter: Secondary | ICD-10-CM | POA: Diagnosis not present

## 2020-01-01 DIAGNOSIS — R278 Other lack of coordination: Secondary | ICD-10-CM | POA: Diagnosis not present

## 2020-01-01 DIAGNOSIS — Z741 Need for assistance with personal care: Secondary | ICD-10-CM | POA: Diagnosis not present

## 2020-01-01 DIAGNOSIS — M6281 Muscle weakness (generalized): Secondary | ICD-10-CM | POA: Diagnosis not present

## 2020-01-01 DIAGNOSIS — J449 Chronic obstructive pulmonary disease, unspecified: Secondary | ICD-10-CM | POA: Diagnosis not present

## 2020-01-01 DIAGNOSIS — I5032 Chronic diastolic (congestive) heart failure: Secondary | ICD-10-CM | POA: Diagnosis not present

## 2020-01-01 DIAGNOSIS — Z48811 Encounter for surgical aftercare following surgery on the nervous system: Secondary | ICD-10-CM | POA: Diagnosis not present

## 2020-01-02 DIAGNOSIS — Z48811 Encounter for surgical aftercare following surgery on the nervous system: Secondary | ICD-10-CM | POA: Diagnosis not present

## 2020-01-02 DIAGNOSIS — Z741 Need for assistance with personal care: Secondary | ICD-10-CM | POA: Diagnosis not present

## 2020-01-02 DIAGNOSIS — W19XXXD Unspecified fall, subsequent encounter: Secondary | ICD-10-CM | POA: Diagnosis not present

## 2020-01-02 DIAGNOSIS — I5032 Chronic diastolic (congestive) heart failure: Secondary | ICD-10-CM | POA: Diagnosis not present

## 2020-01-02 DIAGNOSIS — J449 Chronic obstructive pulmonary disease, unspecified: Secondary | ICD-10-CM | POA: Diagnosis not present

## 2020-01-02 DIAGNOSIS — M6281 Muscle weakness (generalized): Secondary | ICD-10-CM | POA: Diagnosis not present

## 2020-01-02 DIAGNOSIS — M545 Low back pain, unspecified: Secondary | ICD-10-CM | POA: Diagnosis not present

## 2020-01-02 DIAGNOSIS — R278 Other lack of coordination: Secondary | ICD-10-CM | POA: Diagnosis not present

## 2020-01-04 DIAGNOSIS — J449 Chronic obstructive pulmonary disease, unspecified: Secondary | ICD-10-CM | POA: Diagnosis not present

## 2020-01-04 DIAGNOSIS — I70245 Atherosclerosis of native arteries of left leg with ulceration of other part of foot: Secondary | ICD-10-CM | POA: Diagnosis not present

## 2020-01-04 DIAGNOSIS — Z48811 Encounter for surgical aftercare following surgery on the nervous system: Secondary | ICD-10-CM | POA: Diagnosis not present

## 2020-01-04 DIAGNOSIS — Z741 Need for assistance with personal care: Secondary | ICD-10-CM | POA: Diagnosis not present

## 2020-01-04 DIAGNOSIS — M545 Low back pain, unspecified: Secondary | ICD-10-CM | POA: Diagnosis not present

## 2020-01-04 DIAGNOSIS — I5032 Chronic diastolic (congestive) heart failure: Secondary | ICD-10-CM | POA: Diagnosis not present

## 2020-01-04 DIAGNOSIS — M6281 Muscle weakness (generalized): Secondary | ICD-10-CM | POA: Diagnosis not present

## 2020-01-04 DIAGNOSIS — R278 Other lack of coordination: Secondary | ICD-10-CM | POA: Diagnosis not present

## 2020-01-04 DIAGNOSIS — W19XXXD Unspecified fall, subsequent encounter: Secondary | ICD-10-CM | POA: Diagnosis not present

## 2020-01-05 DIAGNOSIS — R2681 Unsteadiness on feet: Secondary | ICD-10-CM | POA: Diagnosis not present

## 2020-01-05 DIAGNOSIS — G43909 Migraine, unspecified, not intractable, without status migrainosus: Secondary | ICD-10-CM | POA: Diagnosis not present

## 2020-01-05 DIAGNOSIS — E782 Mixed hyperlipidemia: Secondary | ICD-10-CM | POA: Diagnosis not present

## 2020-01-05 DIAGNOSIS — R262 Difficulty in walking, not elsewhere classified: Secondary | ICD-10-CM | POA: Diagnosis not present

## 2020-01-05 DIAGNOSIS — W19XXXD Unspecified fall, subsequent encounter: Secondary | ICD-10-CM | POA: Diagnosis not present

## 2020-01-05 DIAGNOSIS — J9601 Acute respiratory failure with hypoxia: Secondary | ICD-10-CM | POA: Diagnosis not present

## 2020-01-05 DIAGNOSIS — Z741 Need for assistance with personal care: Secondary | ICD-10-CM | POA: Diagnosis not present

## 2020-01-05 DIAGNOSIS — Z48811 Encounter for surgical aftercare following surgery on the nervous system: Secondary | ICD-10-CM | POA: Diagnosis not present

## 2020-01-05 DIAGNOSIS — I10 Essential (primary) hypertension: Secondary | ICD-10-CM | POA: Diagnosis not present

## 2020-01-05 DIAGNOSIS — R0902 Hypoxemia: Secondary | ICD-10-CM | POA: Diagnosis not present

## 2020-01-05 DIAGNOSIS — R52 Pain, unspecified: Secondary | ICD-10-CM | POA: Diagnosis not present

## 2020-01-05 DIAGNOSIS — R278 Other lack of coordination: Secondary | ICD-10-CM | POA: Diagnosis not present

## 2020-01-05 DIAGNOSIS — M6281 Muscle weakness (generalized): Secondary | ICD-10-CM | POA: Diagnosis not present

## 2020-01-05 DIAGNOSIS — J9610 Chronic respiratory failure, unspecified whether with hypoxia or hypercapnia: Secondary | ICD-10-CM | POA: Diagnosis not present

## 2020-01-05 DIAGNOSIS — J45909 Unspecified asthma, uncomplicated: Secondary | ICD-10-CM | POA: Diagnosis not present

## 2020-01-05 DIAGNOSIS — H5712 Ocular pain, left eye: Secondary | ICD-10-CM | POA: Diagnosis not present

## 2020-01-05 DIAGNOSIS — I5032 Chronic diastolic (congestive) heart failure: Secondary | ICD-10-CM | POA: Diagnosis not present

## 2020-01-05 DIAGNOSIS — M545 Low back pain, unspecified: Secondary | ICD-10-CM | POA: Diagnosis not present

## 2020-01-05 DIAGNOSIS — J449 Chronic obstructive pulmonary disease, unspecified: Secondary | ICD-10-CM | POA: Diagnosis not present

## 2020-01-06 DIAGNOSIS — R278 Other lack of coordination: Secondary | ICD-10-CM | POA: Diagnosis not present

## 2020-01-06 DIAGNOSIS — Z741 Need for assistance with personal care: Secondary | ICD-10-CM | POA: Diagnosis not present

## 2020-01-06 DIAGNOSIS — Z48811 Encounter for surgical aftercare following surgery on the nervous system: Secondary | ICD-10-CM | POA: Diagnosis not present

## 2020-01-06 DIAGNOSIS — M6281 Muscle weakness (generalized): Secondary | ICD-10-CM | POA: Diagnosis not present

## 2020-01-06 DIAGNOSIS — I5032 Chronic diastolic (congestive) heart failure: Secondary | ICD-10-CM | POA: Diagnosis not present

## 2020-01-06 DIAGNOSIS — R2681 Unsteadiness on feet: Secondary | ICD-10-CM | POA: Diagnosis not present

## 2020-01-06 DIAGNOSIS — J449 Chronic obstructive pulmonary disease, unspecified: Secondary | ICD-10-CM | POA: Diagnosis not present

## 2020-01-06 DIAGNOSIS — W19XXXD Unspecified fall, subsequent encounter: Secondary | ICD-10-CM | POA: Diagnosis not present

## 2020-01-07 DIAGNOSIS — I739 Peripheral vascular disease, unspecified: Secondary | ICD-10-CM | POA: Diagnosis not present

## 2020-01-07 DIAGNOSIS — Z741 Need for assistance with personal care: Secondary | ICD-10-CM | POA: Diagnosis not present

## 2020-01-07 DIAGNOSIS — R278 Other lack of coordination: Secondary | ICD-10-CM | POA: Diagnosis not present

## 2020-01-07 DIAGNOSIS — Z48811 Encounter for surgical aftercare following surgery on the nervous system: Secondary | ICD-10-CM | POA: Diagnosis not present

## 2020-01-07 DIAGNOSIS — R2681 Unsteadiness on feet: Secondary | ICD-10-CM | POA: Diagnosis not present

## 2020-01-07 DIAGNOSIS — J449 Chronic obstructive pulmonary disease, unspecified: Secondary | ICD-10-CM | POA: Diagnosis not present

## 2020-01-07 DIAGNOSIS — I5032 Chronic diastolic (congestive) heart failure: Secondary | ICD-10-CM | POA: Diagnosis not present

## 2020-01-07 DIAGNOSIS — W19XXXD Unspecified fall, subsequent encounter: Secondary | ICD-10-CM | POA: Diagnosis not present

## 2020-01-07 DIAGNOSIS — M6281 Muscle weakness (generalized): Secondary | ICD-10-CM | POA: Diagnosis not present

## 2020-01-08 DIAGNOSIS — M6281 Muscle weakness (generalized): Secondary | ICD-10-CM | POA: Diagnosis not present

## 2020-01-08 DIAGNOSIS — R2681 Unsteadiness on feet: Secondary | ICD-10-CM | POA: Diagnosis not present

## 2020-01-08 DIAGNOSIS — R278 Other lack of coordination: Secondary | ICD-10-CM | POA: Diagnosis not present

## 2020-01-08 DIAGNOSIS — I5032 Chronic diastolic (congestive) heart failure: Secondary | ICD-10-CM | POA: Diagnosis not present

## 2020-01-08 DIAGNOSIS — Z48811 Encounter for surgical aftercare following surgery on the nervous system: Secondary | ICD-10-CM | POA: Diagnosis not present

## 2020-01-08 DIAGNOSIS — J449 Chronic obstructive pulmonary disease, unspecified: Secondary | ICD-10-CM | POA: Diagnosis not present

## 2020-01-08 DIAGNOSIS — W19XXXD Unspecified fall, subsequent encounter: Secondary | ICD-10-CM | POA: Diagnosis not present

## 2020-01-08 DIAGNOSIS — Z741 Need for assistance with personal care: Secondary | ICD-10-CM | POA: Diagnosis not present

## 2020-01-09 DIAGNOSIS — J449 Chronic obstructive pulmonary disease, unspecified: Secondary | ICD-10-CM | POA: Diagnosis not present

## 2020-01-09 DIAGNOSIS — Z741 Need for assistance with personal care: Secondary | ICD-10-CM | POA: Diagnosis not present

## 2020-01-09 DIAGNOSIS — M6281 Muscle weakness (generalized): Secondary | ICD-10-CM | POA: Diagnosis not present

## 2020-01-09 DIAGNOSIS — Z48811 Encounter for surgical aftercare following surgery on the nervous system: Secondary | ICD-10-CM | POA: Diagnosis not present

## 2020-01-09 DIAGNOSIS — W19XXXD Unspecified fall, subsequent encounter: Secondary | ICD-10-CM | POA: Diagnosis not present

## 2020-01-09 DIAGNOSIS — R278 Other lack of coordination: Secondary | ICD-10-CM | POA: Diagnosis not present

## 2020-01-09 DIAGNOSIS — I5032 Chronic diastolic (congestive) heart failure: Secondary | ICD-10-CM | POA: Diagnosis not present

## 2020-01-09 DIAGNOSIS — R2681 Unsteadiness on feet: Secondary | ICD-10-CM | POA: Diagnosis not present

## 2020-01-10 DIAGNOSIS — Z741 Need for assistance with personal care: Secondary | ICD-10-CM | POA: Diagnosis not present

## 2020-01-10 DIAGNOSIS — R2681 Unsteadiness on feet: Secondary | ICD-10-CM | POA: Diagnosis not present

## 2020-01-10 DIAGNOSIS — I5032 Chronic diastolic (congestive) heart failure: Secondary | ICD-10-CM | POA: Diagnosis not present

## 2020-01-10 DIAGNOSIS — R278 Other lack of coordination: Secondary | ICD-10-CM | POA: Diagnosis not present

## 2020-01-10 DIAGNOSIS — M6281 Muscle weakness (generalized): Secondary | ICD-10-CM | POA: Diagnosis not present

## 2020-01-10 DIAGNOSIS — J449 Chronic obstructive pulmonary disease, unspecified: Secondary | ICD-10-CM | POA: Diagnosis not present

## 2020-01-10 DIAGNOSIS — Z48811 Encounter for surgical aftercare following surgery on the nervous system: Secondary | ICD-10-CM | POA: Diagnosis not present

## 2020-01-10 DIAGNOSIS — W19XXXD Unspecified fall, subsequent encounter: Secondary | ICD-10-CM | POA: Diagnosis not present

## 2020-01-11 DIAGNOSIS — R278 Other lack of coordination: Secondary | ICD-10-CM | POA: Diagnosis not present

## 2020-01-11 DIAGNOSIS — J449 Chronic obstructive pulmonary disease, unspecified: Secondary | ICD-10-CM | POA: Diagnosis not present

## 2020-01-11 DIAGNOSIS — I5032 Chronic diastolic (congestive) heart failure: Secondary | ICD-10-CM | POA: Diagnosis not present

## 2020-01-11 DIAGNOSIS — R2681 Unsteadiness on feet: Secondary | ICD-10-CM | POA: Diagnosis not present

## 2020-01-11 DIAGNOSIS — Z48811 Encounter for surgical aftercare following surgery on the nervous system: Secondary | ICD-10-CM | POA: Diagnosis not present

## 2020-01-11 DIAGNOSIS — F4323 Adjustment disorder with mixed anxiety and depressed mood: Secondary | ICD-10-CM | POA: Diagnosis not present

## 2020-01-11 DIAGNOSIS — Z741 Need for assistance with personal care: Secondary | ICD-10-CM | POA: Diagnosis not present

## 2020-01-11 DIAGNOSIS — G4700 Insomnia, unspecified: Secondary | ICD-10-CM | POA: Diagnosis not present

## 2020-01-11 DIAGNOSIS — W19XXXD Unspecified fall, subsequent encounter: Secondary | ICD-10-CM | POA: Diagnosis not present

## 2020-01-11 DIAGNOSIS — M6281 Muscle weakness (generalized): Secondary | ICD-10-CM | POA: Diagnosis not present

## 2020-01-12 DIAGNOSIS — M6281 Muscle weakness (generalized): Secondary | ICD-10-CM | POA: Diagnosis not present

## 2020-01-12 DIAGNOSIS — J449 Chronic obstructive pulmonary disease, unspecified: Secondary | ICD-10-CM | POA: Diagnosis not present

## 2020-01-12 DIAGNOSIS — J9610 Chronic respiratory failure, unspecified whether with hypoxia or hypercapnia: Secondary | ICD-10-CM | POA: Diagnosis not present

## 2020-01-12 DIAGNOSIS — I10 Essential (primary) hypertension: Secondary | ICD-10-CM | POA: Diagnosis not present

## 2020-01-12 DIAGNOSIS — I5032 Chronic diastolic (congestive) heart failure: Secondary | ICD-10-CM | POA: Diagnosis not present

## 2020-01-12 DIAGNOSIS — I70245 Atherosclerosis of native arteries of left leg with ulceration of other part of foot: Secondary | ICD-10-CM | POA: Diagnosis not present

## 2020-01-12 DIAGNOSIS — R262 Difficulty in walking, not elsewhere classified: Secondary | ICD-10-CM | POA: Diagnosis not present

## 2020-01-12 DIAGNOSIS — Z48811 Encounter for surgical aftercare following surgery on the nervous system: Secondary | ICD-10-CM | POA: Diagnosis not present

## 2020-01-12 DIAGNOSIS — R52 Pain, unspecified: Secondary | ICD-10-CM | POA: Diagnosis not present

## 2020-01-12 DIAGNOSIS — I70235 Atherosclerosis of native arteries of right leg with ulceration of other part of foot: Secondary | ICD-10-CM | POA: Diagnosis not present

## 2020-01-12 DIAGNOSIS — W19XXXD Unspecified fall, subsequent encounter: Secondary | ICD-10-CM | POA: Diagnosis not present

## 2020-01-12 DIAGNOSIS — E782 Mixed hyperlipidemia: Secondary | ICD-10-CM | POA: Diagnosis not present

## 2020-01-12 DIAGNOSIS — Z741 Need for assistance with personal care: Secondary | ICD-10-CM | POA: Diagnosis not present

## 2020-01-12 DIAGNOSIS — R2681 Unsteadiness on feet: Secondary | ICD-10-CM | POA: Diagnosis not present

## 2020-01-12 DIAGNOSIS — R278 Other lack of coordination: Secondary | ICD-10-CM | POA: Diagnosis not present

## 2020-01-13 DIAGNOSIS — R278 Other lack of coordination: Secondary | ICD-10-CM | POA: Diagnosis not present

## 2020-01-13 DIAGNOSIS — Z741 Need for assistance with personal care: Secondary | ICD-10-CM | POA: Diagnosis not present

## 2020-01-13 DIAGNOSIS — M6281 Muscle weakness (generalized): Secondary | ICD-10-CM | POA: Diagnosis not present

## 2020-01-13 DIAGNOSIS — I5032 Chronic diastolic (congestive) heart failure: Secondary | ICD-10-CM | POA: Diagnosis not present

## 2020-01-13 DIAGNOSIS — Z48811 Encounter for surgical aftercare following surgery on the nervous system: Secondary | ICD-10-CM | POA: Diagnosis not present

## 2020-01-13 DIAGNOSIS — J449 Chronic obstructive pulmonary disease, unspecified: Secondary | ICD-10-CM | POA: Diagnosis not present

## 2020-01-13 DIAGNOSIS — W19XXXD Unspecified fall, subsequent encounter: Secondary | ICD-10-CM | POA: Diagnosis not present

## 2020-01-13 DIAGNOSIS — R2681 Unsteadiness on feet: Secondary | ICD-10-CM | POA: Diagnosis not present

## 2020-01-14 DIAGNOSIS — Z741 Need for assistance with personal care: Secondary | ICD-10-CM | POA: Diagnosis not present

## 2020-01-14 DIAGNOSIS — R278 Other lack of coordination: Secondary | ICD-10-CM | POA: Diagnosis not present

## 2020-01-14 DIAGNOSIS — R2681 Unsteadiness on feet: Secondary | ICD-10-CM | POA: Diagnosis not present

## 2020-01-14 DIAGNOSIS — Z48811 Encounter for surgical aftercare following surgery on the nervous system: Secondary | ICD-10-CM | POA: Diagnosis not present

## 2020-01-14 DIAGNOSIS — I5032 Chronic diastolic (congestive) heart failure: Secondary | ICD-10-CM | POA: Diagnosis not present

## 2020-01-14 DIAGNOSIS — J449 Chronic obstructive pulmonary disease, unspecified: Secondary | ICD-10-CM | POA: Diagnosis not present

## 2020-01-14 DIAGNOSIS — W19XXXD Unspecified fall, subsequent encounter: Secondary | ICD-10-CM | POA: Diagnosis not present

## 2020-01-14 DIAGNOSIS — M6281 Muscle weakness (generalized): Secondary | ICD-10-CM | POA: Diagnosis not present

## 2020-01-15 DIAGNOSIS — Z741 Need for assistance with personal care: Secondary | ICD-10-CM | POA: Diagnosis not present

## 2020-01-15 DIAGNOSIS — R2681 Unsteadiness on feet: Secondary | ICD-10-CM | POA: Diagnosis not present

## 2020-01-15 DIAGNOSIS — I5032 Chronic diastolic (congestive) heart failure: Secondary | ICD-10-CM | POA: Diagnosis not present

## 2020-01-15 DIAGNOSIS — R278 Other lack of coordination: Secondary | ICD-10-CM | POA: Diagnosis not present

## 2020-01-15 DIAGNOSIS — W19XXXD Unspecified fall, subsequent encounter: Secondary | ICD-10-CM | POA: Diagnosis not present

## 2020-01-15 DIAGNOSIS — Z48811 Encounter for surgical aftercare following surgery on the nervous system: Secondary | ICD-10-CM | POA: Diagnosis not present

## 2020-01-15 DIAGNOSIS — M6281 Muscle weakness (generalized): Secondary | ICD-10-CM | POA: Diagnosis not present

## 2020-01-15 DIAGNOSIS — J449 Chronic obstructive pulmonary disease, unspecified: Secondary | ICD-10-CM | POA: Diagnosis not present

## 2020-01-16 DIAGNOSIS — I5032 Chronic diastolic (congestive) heart failure: Secondary | ICD-10-CM | POA: Diagnosis not present

## 2020-01-16 DIAGNOSIS — R2681 Unsteadiness on feet: Secondary | ICD-10-CM | POA: Diagnosis not present

## 2020-01-16 DIAGNOSIS — R278 Other lack of coordination: Secondary | ICD-10-CM | POA: Diagnosis not present

## 2020-01-16 DIAGNOSIS — J449 Chronic obstructive pulmonary disease, unspecified: Secondary | ICD-10-CM | POA: Diagnosis not present

## 2020-01-16 DIAGNOSIS — Z741 Need for assistance with personal care: Secondary | ICD-10-CM | POA: Diagnosis not present

## 2020-01-16 DIAGNOSIS — W19XXXD Unspecified fall, subsequent encounter: Secondary | ICD-10-CM | POA: Diagnosis not present

## 2020-01-16 DIAGNOSIS — Z48811 Encounter for surgical aftercare following surgery on the nervous system: Secondary | ICD-10-CM | POA: Diagnosis not present

## 2020-01-16 DIAGNOSIS — M6281 Muscle weakness (generalized): Secondary | ICD-10-CM | POA: Diagnosis not present

## 2020-01-17 DIAGNOSIS — W19XXXD Unspecified fall, subsequent encounter: Secondary | ICD-10-CM | POA: Diagnosis not present

## 2020-01-17 DIAGNOSIS — M6281 Muscle weakness (generalized): Secondary | ICD-10-CM | POA: Diagnosis not present

## 2020-01-17 DIAGNOSIS — Z48811 Encounter for surgical aftercare following surgery on the nervous system: Secondary | ICD-10-CM | POA: Diagnosis not present

## 2020-01-17 DIAGNOSIS — R2681 Unsteadiness on feet: Secondary | ICD-10-CM | POA: Diagnosis not present

## 2020-01-17 DIAGNOSIS — I5032 Chronic diastolic (congestive) heart failure: Secondary | ICD-10-CM | POA: Diagnosis not present

## 2020-01-17 DIAGNOSIS — J449 Chronic obstructive pulmonary disease, unspecified: Secondary | ICD-10-CM | POA: Diagnosis not present

## 2020-01-17 DIAGNOSIS — R278 Other lack of coordination: Secondary | ICD-10-CM | POA: Diagnosis not present

## 2020-01-17 DIAGNOSIS — Z741 Need for assistance with personal care: Secondary | ICD-10-CM | POA: Diagnosis not present

## 2020-01-18 DIAGNOSIS — M6281 Muscle weakness (generalized): Secondary | ICD-10-CM | POA: Diagnosis not present

## 2020-01-18 DIAGNOSIS — W19XXXD Unspecified fall, subsequent encounter: Secondary | ICD-10-CM | POA: Diagnosis not present

## 2020-01-18 DIAGNOSIS — J449 Chronic obstructive pulmonary disease, unspecified: Secondary | ICD-10-CM | POA: Diagnosis not present

## 2020-01-18 DIAGNOSIS — R2681 Unsteadiness on feet: Secondary | ICD-10-CM | POA: Diagnosis not present

## 2020-01-18 DIAGNOSIS — Z741 Need for assistance with personal care: Secondary | ICD-10-CM | POA: Diagnosis not present

## 2020-01-18 DIAGNOSIS — Z48811 Encounter for surgical aftercare following surgery on the nervous system: Secondary | ICD-10-CM | POA: Diagnosis not present

## 2020-01-18 DIAGNOSIS — R278 Other lack of coordination: Secondary | ICD-10-CM | POA: Diagnosis not present

## 2020-01-18 DIAGNOSIS — I5032 Chronic diastolic (congestive) heart failure: Secondary | ICD-10-CM | POA: Diagnosis not present

## 2020-01-19 DIAGNOSIS — W19XXXD Unspecified fall, subsequent encounter: Secondary | ICD-10-CM | POA: Diagnosis not present

## 2020-01-19 DIAGNOSIS — R2681 Unsteadiness on feet: Secondary | ICD-10-CM | POA: Diagnosis not present

## 2020-01-19 DIAGNOSIS — M6281 Muscle weakness (generalized): Secondary | ICD-10-CM | POA: Diagnosis not present

## 2020-01-19 DIAGNOSIS — I5032 Chronic diastolic (congestive) heart failure: Secondary | ICD-10-CM | POA: Diagnosis not present

## 2020-01-19 DIAGNOSIS — R278 Other lack of coordination: Secondary | ICD-10-CM | POA: Diagnosis not present

## 2020-01-19 DIAGNOSIS — J449 Chronic obstructive pulmonary disease, unspecified: Secondary | ICD-10-CM | POA: Diagnosis not present

## 2020-01-19 DIAGNOSIS — I10 Essential (primary) hypertension: Secondary | ICD-10-CM | POA: Diagnosis not present

## 2020-01-19 DIAGNOSIS — Z741 Need for assistance with personal care: Secondary | ICD-10-CM | POA: Diagnosis not present

## 2020-01-19 DIAGNOSIS — Z48811 Encounter for surgical aftercare following surgery on the nervous system: Secondary | ICD-10-CM | POA: Diagnosis not present

## 2020-01-19 DIAGNOSIS — R262 Difficulty in walking, not elsewhere classified: Secondary | ICD-10-CM | POA: Diagnosis not present

## 2020-01-19 DIAGNOSIS — I70245 Atherosclerosis of native arteries of left leg with ulceration of other part of foot: Secondary | ICD-10-CM | POA: Diagnosis not present

## 2020-01-19 DIAGNOSIS — I70235 Atherosclerosis of native arteries of right leg with ulceration of other part of foot: Secondary | ICD-10-CM | POA: Diagnosis not present

## 2020-01-19 DIAGNOSIS — R52 Pain, unspecified: Secondary | ICD-10-CM | POA: Diagnosis not present

## 2020-01-19 DIAGNOSIS — J9610 Chronic respiratory failure, unspecified whether with hypoxia or hypercapnia: Secondary | ICD-10-CM | POA: Diagnosis not present

## 2020-01-19 DIAGNOSIS — E782 Mixed hyperlipidemia: Secondary | ICD-10-CM | POA: Diagnosis not present

## 2020-01-20 DIAGNOSIS — M6281 Muscle weakness (generalized): Secondary | ICD-10-CM | POA: Diagnosis not present

## 2020-01-20 DIAGNOSIS — W19XXXD Unspecified fall, subsequent encounter: Secondary | ICD-10-CM | POA: Diagnosis not present

## 2020-01-20 DIAGNOSIS — R278 Other lack of coordination: Secondary | ICD-10-CM | POA: Diagnosis not present

## 2020-01-20 DIAGNOSIS — Z48811 Encounter for surgical aftercare following surgery on the nervous system: Secondary | ICD-10-CM | POA: Diagnosis not present

## 2020-01-20 DIAGNOSIS — I5032 Chronic diastolic (congestive) heart failure: Secondary | ICD-10-CM | POA: Diagnosis not present

## 2020-01-20 DIAGNOSIS — J449 Chronic obstructive pulmonary disease, unspecified: Secondary | ICD-10-CM | POA: Diagnosis not present

## 2020-01-20 DIAGNOSIS — R2681 Unsteadiness on feet: Secondary | ICD-10-CM | POA: Diagnosis not present

## 2020-01-20 DIAGNOSIS — Z741 Need for assistance with personal care: Secondary | ICD-10-CM | POA: Diagnosis not present

## 2020-01-21 DIAGNOSIS — I10 Essential (primary) hypertension: Secondary | ICD-10-CM | POA: Diagnosis not present

## 2020-01-21 DIAGNOSIS — W19XXXD Unspecified fall, subsequent encounter: Secondary | ICD-10-CM | POA: Diagnosis not present

## 2020-01-21 DIAGNOSIS — R278 Other lack of coordination: Secondary | ICD-10-CM | POA: Diagnosis not present

## 2020-01-21 DIAGNOSIS — I5032 Chronic diastolic (congestive) heart failure: Secondary | ICD-10-CM | POA: Diagnosis not present

## 2020-01-21 DIAGNOSIS — Z741 Need for assistance with personal care: Secondary | ICD-10-CM | POA: Diagnosis not present

## 2020-01-21 DIAGNOSIS — Z48811 Encounter for surgical aftercare following surgery on the nervous system: Secondary | ICD-10-CM | POA: Diagnosis not present

## 2020-01-21 DIAGNOSIS — J449 Chronic obstructive pulmonary disease, unspecified: Secondary | ICD-10-CM | POA: Diagnosis not present

## 2020-01-21 DIAGNOSIS — E782 Mixed hyperlipidemia: Secondary | ICD-10-CM | POA: Diagnosis not present

## 2020-01-21 DIAGNOSIS — R262 Difficulty in walking, not elsewhere classified: Secondary | ICD-10-CM | POA: Diagnosis not present

## 2020-01-21 DIAGNOSIS — M6281 Muscle weakness (generalized): Secondary | ICD-10-CM | POA: Diagnosis not present

## 2020-01-21 DIAGNOSIS — J9610 Chronic respiratory failure, unspecified whether with hypoxia or hypercapnia: Secondary | ICD-10-CM | POA: Diagnosis not present

## 2020-01-21 DIAGNOSIS — R52 Pain, unspecified: Secondary | ICD-10-CM | POA: Diagnosis not present

## 2020-01-21 DIAGNOSIS — R2681 Unsteadiness on feet: Secondary | ICD-10-CM | POA: Diagnosis not present

## 2020-01-22 ENCOUNTER — Ambulatory Visit (INDEPENDENT_AMBULATORY_CARE_PROVIDER_SITE_OTHER): Payer: Medicare HMO | Admitting: Podiatry

## 2020-01-22 ENCOUNTER — Other Ambulatory Visit: Payer: Self-pay

## 2020-01-22 ENCOUNTER — Ambulatory Visit (INDEPENDENT_AMBULATORY_CARE_PROVIDER_SITE_OTHER): Payer: Medicare HMO

## 2020-01-22 DIAGNOSIS — S92512A Displaced fracture of proximal phalanx of left lesser toe(s), initial encounter for closed fracture: Secondary | ICD-10-CM

## 2020-01-22 DIAGNOSIS — Z48811 Encounter for surgical aftercare following surgery on the nervous system: Secondary | ICD-10-CM | POA: Diagnosis not present

## 2020-01-22 DIAGNOSIS — E7800 Pure hypercholesterolemia, unspecified: Secondary | ICD-10-CM | POA: Diagnosis not present

## 2020-01-22 DIAGNOSIS — J449 Chronic obstructive pulmonary disease, unspecified: Secondary | ICD-10-CM | POA: Diagnosis not present

## 2020-01-22 DIAGNOSIS — G43909 Migraine, unspecified, not intractable, without status migrainosus: Secondary | ICD-10-CM | POA: Diagnosis not present

## 2020-01-22 DIAGNOSIS — G903 Multi-system degeneration of the autonomic nervous system: Secondary | ICD-10-CM | POA: Insufficient documentation

## 2020-01-22 DIAGNOSIS — M810 Age-related osteoporosis without current pathological fracture: Secondary | ICD-10-CM | POA: Insufficient documentation

## 2020-01-22 DIAGNOSIS — B351 Tinea unguium: Secondary | ICD-10-CM | POA: Diagnosis not present

## 2020-01-22 DIAGNOSIS — R278 Other lack of coordination: Secondary | ICD-10-CM | POA: Diagnosis not present

## 2020-01-22 DIAGNOSIS — I712 Thoracic aortic aneurysm, without rupture, unspecified: Secondary | ICD-10-CM | POA: Insufficient documentation

## 2020-01-22 DIAGNOSIS — S90122A Contusion of left lesser toe(s) without damage to nail, initial encounter: Secondary | ICD-10-CM | POA: Diagnosis not present

## 2020-01-22 DIAGNOSIS — G4700 Insomnia, unspecified: Secondary | ICD-10-CM | POA: Insufficient documentation

## 2020-01-22 DIAGNOSIS — R2681 Unsteadiness on feet: Secondary | ICD-10-CM | POA: Diagnosis not present

## 2020-01-22 DIAGNOSIS — M4800 Spinal stenosis, site unspecified: Secondary | ICD-10-CM | POA: Insufficient documentation

## 2020-01-22 DIAGNOSIS — I5032 Chronic diastolic (congestive) heart failure: Secondary | ICD-10-CM | POA: Diagnosis not present

## 2020-01-22 DIAGNOSIS — Z741 Need for assistance with personal care: Secondary | ICD-10-CM | POA: Diagnosis not present

## 2020-01-22 DIAGNOSIS — N183 Chronic kidney disease, stage 3 unspecified: Secondary | ICD-10-CM

## 2020-01-22 DIAGNOSIS — M109 Gout, unspecified: Secondary | ICD-10-CM | POA: Insufficient documentation

## 2020-01-22 DIAGNOSIS — M797 Fibromyalgia: Secondary | ICD-10-CM | POA: Insufficient documentation

## 2020-01-22 DIAGNOSIS — W19XXXD Unspecified fall, subsequent encounter: Secondary | ICD-10-CM | POA: Diagnosis not present

## 2020-01-22 DIAGNOSIS — M79672 Pain in left foot: Secondary | ICD-10-CM

## 2020-01-22 DIAGNOSIS — M1990 Unspecified osteoarthritis, unspecified site: Secondary | ICD-10-CM | POA: Insufficient documentation

## 2020-01-22 DIAGNOSIS — M6281 Muscle weakness (generalized): Secondary | ICD-10-CM | POA: Diagnosis not present

## 2020-01-22 DIAGNOSIS — I129 Hypertensive chronic kidney disease with stage 1 through stage 4 chronic kidney disease, or unspecified chronic kidney disease: Secondary | ICD-10-CM | POA: Insufficient documentation

## 2020-01-23 DIAGNOSIS — I5032 Chronic diastolic (congestive) heart failure: Secondary | ICD-10-CM | POA: Diagnosis not present

## 2020-01-23 DIAGNOSIS — Z741 Need for assistance with personal care: Secondary | ICD-10-CM | POA: Diagnosis not present

## 2020-01-23 DIAGNOSIS — Z48811 Encounter for surgical aftercare following surgery on the nervous system: Secondary | ICD-10-CM | POA: Diagnosis not present

## 2020-01-23 DIAGNOSIS — J449 Chronic obstructive pulmonary disease, unspecified: Secondary | ICD-10-CM | POA: Diagnosis not present

## 2020-01-23 DIAGNOSIS — W19XXXD Unspecified fall, subsequent encounter: Secondary | ICD-10-CM | POA: Diagnosis not present

## 2020-01-23 DIAGNOSIS — R2681 Unsteadiness on feet: Secondary | ICD-10-CM | POA: Diagnosis not present

## 2020-01-23 DIAGNOSIS — M6281 Muscle weakness (generalized): Secondary | ICD-10-CM | POA: Diagnosis not present

## 2020-01-23 DIAGNOSIS — R278 Other lack of coordination: Secondary | ICD-10-CM | POA: Diagnosis not present

## 2020-01-24 DIAGNOSIS — R278 Other lack of coordination: Secondary | ICD-10-CM | POA: Diagnosis not present

## 2020-01-24 DIAGNOSIS — M6281 Muscle weakness (generalized): Secondary | ICD-10-CM | POA: Diagnosis not present

## 2020-01-24 DIAGNOSIS — I5032 Chronic diastolic (congestive) heart failure: Secondary | ICD-10-CM | POA: Diagnosis not present

## 2020-01-24 DIAGNOSIS — W19XXXD Unspecified fall, subsequent encounter: Secondary | ICD-10-CM | POA: Diagnosis not present

## 2020-01-24 DIAGNOSIS — J449 Chronic obstructive pulmonary disease, unspecified: Secondary | ICD-10-CM | POA: Diagnosis not present

## 2020-01-24 DIAGNOSIS — R2681 Unsteadiness on feet: Secondary | ICD-10-CM | POA: Diagnosis not present

## 2020-01-24 DIAGNOSIS — Z741 Need for assistance with personal care: Secondary | ICD-10-CM | POA: Diagnosis not present

## 2020-01-24 DIAGNOSIS — Z48811 Encounter for surgical aftercare following surgery on the nervous system: Secondary | ICD-10-CM | POA: Diagnosis not present

## 2020-01-25 DIAGNOSIS — W19XXXD Unspecified fall, subsequent encounter: Secondary | ICD-10-CM | POA: Diagnosis not present

## 2020-01-25 DIAGNOSIS — M6281 Muscle weakness (generalized): Secondary | ICD-10-CM | POA: Diagnosis not present

## 2020-01-25 DIAGNOSIS — Z741 Need for assistance with personal care: Secondary | ICD-10-CM | POA: Diagnosis not present

## 2020-01-25 DIAGNOSIS — I5032 Chronic diastolic (congestive) heart failure: Secondary | ICD-10-CM | POA: Diagnosis not present

## 2020-01-25 DIAGNOSIS — R278 Other lack of coordination: Secondary | ICD-10-CM | POA: Diagnosis not present

## 2020-01-25 DIAGNOSIS — Z48811 Encounter for surgical aftercare following surgery on the nervous system: Secondary | ICD-10-CM | POA: Diagnosis not present

## 2020-01-25 DIAGNOSIS — J449 Chronic obstructive pulmonary disease, unspecified: Secondary | ICD-10-CM | POA: Diagnosis not present

## 2020-01-25 DIAGNOSIS — R2681 Unsteadiness on feet: Secondary | ICD-10-CM | POA: Diagnosis not present

## 2020-01-26 ENCOUNTER — Other Ambulatory Visit: Payer: Self-pay | Admitting: Podiatry

## 2020-01-26 DIAGNOSIS — R2681 Unsteadiness on feet: Secondary | ICD-10-CM | POA: Diagnosis not present

## 2020-01-26 DIAGNOSIS — E782 Mixed hyperlipidemia: Secondary | ICD-10-CM | POA: Diagnosis not present

## 2020-01-26 DIAGNOSIS — M6281 Muscle weakness (generalized): Secondary | ICD-10-CM | POA: Diagnosis not present

## 2020-01-26 DIAGNOSIS — S90122A Contusion of left lesser toe(s) without damage to nail, initial encounter: Secondary | ICD-10-CM

## 2020-01-26 DIAGNOSIS — Z48811 Encounter for surgical aftercare following surgery on the nervous system: Secondary | ICD-10-CM | POA: Diagnosis not present

## 2020-01-26 DIAGNOSIS — R52 Pain, unspecified: Secondary | ICD-10-CM | POA: Diagnosis not present

## 2020-01-26 DIAGNOSIS — J9610 Chronic respiratory failure, unspecified whether with hypoxia or hypercapnia: Secondary | ICD-10-CM | POA: Diagnosis not present

## 2020-01-26 DIAGNOSIS — R278 Other lack of coordination: Secondary | ICD-10-CM | POA: Diagnosis not present

## 2020-01-26 DIAGNOSIS — I5032 Chronic diastolic (congestive) heart failure: Secondary | ICD-10-CM | POA: Diagnosis not present

## 2020-01-26 DIAGNOSIS — Z741 Need for assistance with personal care: Secondary | ICD-10-CM | POA: Diagnosis not present

## 2020-01-26 DIAGNOSIS — W19XXXD Unspecified fall, subsequent encounter: Secondary | ICD-10-CM | POA: Diagnosis not present

## 2020-01-26 DIAGNOSIS — J449 Chronic obstructive pulmonary disease, unspecified: Secondary | ICD-10-CM | POA: Diagnosis not present

## 2020-01-26 DIAGNOSIS — I10 Essential (primary) hypertension: Secondary | ICD-10-CM | POA: Diagnosis not present

## 2020-01-26 DIAGNOSIS — R262 Difficulty in walking, not elsewhere classified: Secondary | ICD-10-CM | POA: Diagnosis not present

## 2020-01-27 DIAGNOSIS — J449 Chronic obstructive pulmonary disease, unspecified: Secondary | ICD-10-CM | POA: Diagnosis not present

## 2020-01-27 DIAGNOSIS — W19XXXD Unspecified fall, subsequent encounter: Secondary | ICD-10-CM | POA: Diagnosis not present

## 2020-01-27 DIAGNOSIS — Z48811 Encounter for surgical aftercare following surgery on the nervous system: Secondary | ICD-10-CM | POA: Diagnosis not present

## 2020-01-27 DIAGNOSIS — M6281 Muscle weakness (generalized): Secondary | ICD-10-CM | POA: Diagnosis not present

## 2020-01-27 DIAGNOSIS — R2681 Unsteadiness on feet: Secondary | ICD-10-CM | POA: Diagnosis not present

## 2020-01-27 DIAGNOSIS — Z741 Need for assistance with personal care: Secondary | ICD-10-CM | POA: Diagnosis not present

## 2020-01-27 DIAGNOSIS — R278 Other lack of coordination: Secondary | ICD-10-CM | POA: Diagnosis not present

## 2020-01-27 DIAGNOSIS — I5032 Chronic diastolic (congestive) heart failure: Secondary | ICD-10-CM | POA: Diagnosis not present

## 2020-01-28 DIAGNOSIS — I5032 Chronic diastolic (congestive) heart failure: Secondary | ICD-10-CM | POA: Diagnosis not present

## 2020-01-28 DIAGNOSIS — Z741 Need for assistance with personal care: Secondary | ICD-10-CM | POA: Diagnosis not present

## 2020-01-28 DIAGNOSIS — Z48811 Encounter for surgical aftercare following surgery on the nervous system: Secondary | ICD-10-CM | POA: Diagnosis not present

## 2020-01-28 DIAGNOSIS — R278 Other lack of coordination: Secondary | ICD-10-CM | POA: Diagnosis not present

## 2020-01-28 DIAGNOSIS — R2681 Unsteadiness on feet: Secondary | ICD-10-CM | POA: Diagnosis not present

## 2020-01-28 DIAGNOSIS — J449 Chronic obstructive pulmonary disease, unspecified: Secondary | ICD-10-CM | POA: Diagnosis not present

## 2020-01-28 DIAGNOSIS — M6281 Muscle weakness (generalized): Secondary | ICD-10-CM | POA: Diagnosis not present

## 2020-01-28 DIAGNOSIS — W19XXXD Unspecified fall, subsequent encounter: Secondary | ICD-10-CM | POA: Diagnosis not present

## 2020-01-29 DIAGNOSIS — R2681 Unsteadiness on feet: Secondary | ICD-10-CM | POA: Diagnosis not present

## 2020-01-29 DIAGNOSIS — I5032 Chronic diastolic (congestive) heart failure: Secondary | ICD-10-CM | POA: Diagnosis not present

## 2020-01-29 DIAGNOSIS — R278 Other lack of coordination: Secondary | ICD-10-CM | POA: Diagnosis not present

## 2020-01-29 DIAGNOSIS — Z48811 Encounter for surgical aftercare following surgery on the nervous system: Secondary | ICD-10-CM | POA: Diagnosis not present

## 2020-01-29 DIAGNOSIS — W19XXXD Unspecified fall, subsequent encounter: Secondary | ICD-10-CM | POA: Diagnosis not present

## 2020-01-29 DIAGNOSIS — J449 Chronic obstructive pulmonary disease, unspecified: Secondary | ICD-10-CM | POA: Diagnosis not present

## 2020-01-29 DIAGNOSIS — Z741 Need for assistance with personal care: Secondary | ICD-10-CM | POA: Diagnosis not present

## 2020-01-29 DIAGNOSIS — M6281 Muscle weakness (generalized): Secondary | ICD-10-CM | POA: Diagnosis not present

## 2020-02-01 DIAGNOSIS — I5032 Chronic diastolic (congestive) heart failure: Secondary | ICD-10-CM | POA: Diagnosis not present

## 2020-02-01 DIAGNOSIS — Z48811 Encounter for surgical aftercare following surgery on the nervous system: Secondary | ICD-10-CM | POA: Diagnosis not present

## 2020-02-01 DIAGNOSIS — M6281 Muscle weakness (generalized): Secondary | ICD-10-CM | POA: Diagnosis not present

## 2020-02-01 DIAGNOSIS — J449 Chronic obstructive pulmonary disease, unspecified: Secondary | ICD-10-CM | POA: Diagnosis not present

## 2020-02-01 DIAGNOSIS — R278 Other lack of coordination: Secondary | ICD-10-CM | POA: Diagnosis not present

## 2020-02-01 DIAGNOSIS — W19XXXD Unspecified fall, subsequent encounter: Secondary | ICD-10-CM | POA: Diagnosis not present

## 2020-02-01 DIAGNOSIS — R2681 Unsteadiness on feet: Secondary | ICD-10-CM | POA: Diagnosis not present

## 2020-02-01 DIAGNOSIS — Z741 Need for assistance with personal care: Secondary | ICD-10-CM | POA: Diagnosis not present

## 2020-02-02 DIAGNOSIS — R52 Pain, unspecified: Secondary | ICD-10-CM | POA: Diagnosis not present

## 2020-02-02 DIAGNOSIS — Z48811 Encounter for surgical aftercare following surgery on the nervous system: Secondary | ICD-10-CM | POA: Diagnosis not present

## 2020-02-02 DIAGNOSIS — R262 Difficulty in walking, not elsewhere classified: Secondary | ICD-10-CM | POA: Diagnosis not present

## 2020-02-02 DIAGNOSIS — Z741 Need for assistance with personal care: Secondary | ICD-10-CM | POA: Diagnosis not present

## 2020-02-02 DIAGNOSIS — E782 Mixed hyperlipidemia: Secondary | ICD-10-CM | POA: Diagnosis not present

## 2020-02-02 DIAGNOSIS — I10 Essential (primary) hypertension: Secondary | ICD-10-CM | POA: Diagnosis not present

## 2020-02-02 DIAGNOSIS — I5032 Chronic diastolic (congestive) heart failure: Secondary | ICD-10-CM | POA: Diagnosis not present

## 2020-02-02 DIAGNOSIS — J9610 Chronic respiratory failure, unspecified whether with hypoxia or hypercapnia: Secondary | ICD-10-CM | POA: Diagnosis not present

## 2020-02-02 DIAGNOSIS — W19XXXD Unspecified fall, subsequent encounter: Secondary | ICD-10-CM | POA: Diagnosis not present

## 2020-02-02 DIAGNOSIS — M6281 Muscle weakness (generalized): Secondary | ICD-10-CM | POA: Diagnosis not present

## 2020-02-02 DIAGNOSIS — R278 Other lack of coordination: Secondary | ICD-10-CM | POA: Diagnosis not present

## 2020-02-02 DIAGNOSIS — J449 Chronic obstructive pulmonary disease, unspecified: Secondary | ICD-10-CM | POA: Diagnosis not present

## 2020-02-02 DIAGNOSIS — R2681 Unsteadiness on feet: Secondary | ICD-10-CM | POA: Diagnosis not present

## 2020-02-03 DIAGNOSIS — R2681 Unsteadiness on feet: Secondary | ICD-10-CM | POA: Diagnosis not present

## 2020-02-03 DIAGNOSIS — Z20822 Contact with and (suspected) exposure to covid-19: Secondary | ICD-10-CM | POA: Diagnosis not present

## 2020-02-03 DIAGNOSIS — Z741 Need for assistance with personal care: Secondary | ICD-10-CM | POA: Diagnosis not present

## 2020-02-03 DIAGNOSIS — W19XXXD Unspecified fall, subsequent encounter: Secondary | ICD-10-CM | POA: Diagnosis not present

## 2020-02-03 DIAGNOSIS — R278 Other lack of coordination: Secondary | ICD-10-CM | POA: Diagnosis not present

## 2020-02-03 DIAGNOSIS — I5032 Chronic diastolic (congestive) heart failure: Secondary | ICD-10-CM | POA: Diagnosis not present

## 2020-02-03 DIAGNOSIS — J449 Chronic obstructive pulmonary disease, unspecified: Secondary | ICD-10-CM | POA: Diagnosis not present

## 2020-02-03 DIAGNOSIS — Z48811 Encounter for surgical aftercare following surgery on the nervous system: Secondary | ICD-10-CM | POA: Diagnosis not present

## 2020-02-03 DIAGNOSIS — M6281 Muscle weakness (generalized): Secondary | ICD-10-CM | POA: Diagnosis not present

## 2020-02-03 DIAGNOSIS — J3489 Other specified disorders of nose and nasal sinuses: Secondary | ICD-10-CM | POA: Diagnosis not present

## 2020-02-04 DIAGNOSIS — J9601 Acute respiratory failure with hypoxia: Secondary | ICD-10-CM | POA: Diagnosis not present

## 2020-02-04 DIAGNOSIS — Z48811 Encounter for surgical aftercare following surgery on the nervous system: Secondary | ICD-10-CM | POA: Diagnosis not present

## 2020-02-04 DIAGNOSIS — M6281 Muscle weakness (generalized): Secondary | ICD-10-CM | POA: Diagnosis not present

## 2020-02-04 DIAGNOSIS — R2681 Unsteadiness on feet: Secondary | ICD-10-CM | POA: Diagnosis not present

## 2020-02-04 DIAGNOSIS — J449 Chronic obstructive pulmonary disease, unspecified: Secondary | ICD-10-CM | POA: Diagnosis not present

## 2020-02-04 DIAGNOSIS — Z741 Need for assistance with personal care: Secondary | ICD-10-CM | POA: Diagnosis not present

## 2020-02-04 DIAGNOSIS — R0902 Hypoxemia: Secondary | ICD-10-CM | POA: Diagnosis not present

## 2020-02-04 DIAGNOSIS — I5032 Chronic diastolic (congestive) heart failure: Secondary | ICD-10-CM | POA: Diagnosis not present

## 2020-02-04 DIAGNOSIS — R278 Other lack of coordination: Secondary | ICD-10-CM | POA: Diagnosis not present

## 2020-02-04 DIAGNOSIS — W19XXXD Unspecified fall, subsequent encounter: Secondary | ICD-10-CM | POA: Diagnosis not present

## 2020-02-04 DIAGNOSIS — J45909 Unspecified asthma, uncomplicated: Secondary | ICD-10-CM | POA: Diagnosis not present

## 2020-02-05 DIAGNOSIS — M6281 Muscle weakness (generalized): Secondary | ICD-10-CM | POA: Diagnosis not present

## 2020-02-05 DIAGNOSIS — Z48811 Encounter for surgical aftercare following surgery on the nervous system: Secondary | ICD-10-CM | POA: Diagnosis not present

## 2020-02-05 DIAGNOSIS — Z741 Need for assistance with personal care: Secondary | ICD-10-CM | POA: Diagnosis not present

## 2020-02-05 DIAGNOSIS — J449 Chronic obstructive pulmonary disease, unspecified: Secondary | ICD-10-CM | POA: Diagnosis not present

## 2020-02-05 DIAGNOSIS — R2681 Unsteadiness on feet: Secondary | ICD-10-CM | POA: Diagnosis not present

## 2020-02-05 DIAGNOSIS — I5032 Chronic diastolic (congestive) heart failure: Secondary | ICD-10-CM | POA: Diagnosis not present

## 2020-02-05 DIAGNOSIS — W19XXXD Unspecified fall, subsequent encounter: Secondary | ICD-10-CM | POA: Diagnosis not present

## 2020-02-05 DIAGNOSIS — R278 Other lack of coordination: Secondary | ICD-10-CM | POA: Diagnosis not present

## 2020-02-05 NOTE — Progress Notes (Signed)
  Subjective:  Patient ID: Lindsey Rowe, female    DOB: 08/22/1945,  MRN: [id number]  Chief Complaint  Patient presents with  . Toe Injury    Pt states history of broken toe left 5th, 6 weeks ago. Pt states it is not painful, has been wearing a surgical shoe.  . Nail Problem    Nail trim 1-5 bilateral    74 y.o. female presents with the above complaint. History confirmed with patient.   Objective:  Physical Exam: warm, good capillary refill, nail exam onychomycosis of the toenails, no trophic changes or ulcerative lesions, normal DP and PT pulses and normal sensory exam. Left Foot: POP left 5th toe, no bruising or contusions noted.    No images are attached to the encounter.  Radiographs: X-ray of the left foot: displaced angulated left 5th proximal phalanx fracture Assessment:   1. Displaced fracture of proximal phalanx of left lesser toe(s), initial encounter for closed fracture   2. Onychomycosis   3. Stage 3 chronic kidney disease, unspecified whether stage 3a or 3b CKD (HCC)    Plan:  Patient was evaluated and treated and all questions answered.  Ham -XR Reviewed with patient -Would benefit from trial of non-operative management for this injury. -Surgical shoe dispensed -4th/5th toe buddy splinted  -Given length since injury will not attempt further reduction today. Should pain persist would consider possible proximal phalangectomy of the base. -F/u in 4 weeks for new XR  Onychomycosis, CKD3 -Nails palliatively debrided secondary to pain  Procedure: Nail Debridement Type of Debridement: manual, sharp debridement. Instrumentation: Nail nipper, rotary burr. Number of Nails: 10    No follow-ups on file.

## 2020-02-07 DIAGNOSIS — M6281 Muscle weakness (generalized): Secondary | ICD-10-CM | POA: Diagnosis not present

## 2020-02-07 DIAGNOSIS — Z741 Need for assistance with personal care: Secondary | ICD-10-CM | POA: Diagnosis not present

## 2020-02-07 DIAGNOSIS — M2681 Anterior soft tissue impingement: Secondary | ICD-10-CM | POA: Diagnosis not present

## 2020-02-07 DIAGNOSIS — I5032 Chronic diastolic (congestive) heart failure: Secondary | ICD-10-CM | POA: Diagnosis not present

## 2020-02-07 DIAGNOSIS — Z48811 Encounter for surgical aftercare following surgery on the nervous system: Secondary | ICD-10-CM | POA: Diagnosis not present

## 2020-02-07 DIAGNOSIS — W19XXXD Unspecified fall, subsequent encounter: Secondary | ICD-10-CM | POA: Diagnosis not present

## 2020-02-07 DIAGNOSIS — J449 Chronic obstructive pulmonary disease, unspecified: Secondary | ICD-10-CM | POA: Diagnosis not present

## 2020-02-07 DIAGNOSIS — R2681 Unsteadiness on feet: Secondary | ICD-10-CM | POA: Diagnosis not present

## 2020-02-07 DIAGNOSIS — R278 Other lack of coordination: Secondary | ICD-10-CM | POA: Diagnosis not present

## 2020-02-08 DIAGNOSIS — M2681 Anterior soft tissue impingement: Secondary | ICD-10-CM | POA: Diagnosis not present

## 2020-02-08 DIAGNOSIS — J449 Chronic obstructive pulmonary disease, unspecified: Secondary | ICD-10-CM | POA: Diagnosis not present

## 2020-02-08 DIAGNOSIS — I5032 Chronic diastolic (congestive) heart failure: Secondary | ICD-10-CM | POA: Diagnosis not present

## 2020-02-08 DIAGNOSIS — R2681 Unsteadiness on feet: Secondary | ICD-10-CM | POA: Diagnosis not present

## 2020-02-08 DIAGNOSIS — Z48811 Encounter for surgical aftercare following surgery on the nervous system: Secondary | ICD-10-CM | POA: Diagnosis not present

## 2020-02-08 DIAGNOSIS — M6281 Muscle weakness (generalized): Secondary | ICD-10-CM | POA: Diagnosis not present

## 2020-02-08 DIAGNOSIS — Z741 Need for assistance with personal care: Secondary | ICD-10-CM | POA: Diagnosis not present

## 2020-02-08 DIAGNOSIS — W19XXXD Unspecified fall, subsequent encounter: Secondary | ICD-10-CM | POA: Diagnosis not present

## 2020-02-08 DIAGNOSIS — R278 Other lack of coordination: Secondary | ICD-10-CM | POA: Diagnosis not present

## 2020-02-09 DIAGNOSIS — E782 Mixed hyperlipidemia: Secondary | ICD-10-CM | POA: Diagnosis not present

## 2020-02-09 DIAGNOSIS — M2681 Anterior soft tissue impingement: Secondary | ICD-10-CM | POA: Diagnosis not present

## 2020-02-09 DIAGNOSIS — M6281 Muscle weakness (generalized): Secondary | ICD-10-CM | POA: Diagnosis not present

## 2020-02-09 DIAGNOSIS — R262 Difficulty in walking, not elsewhere classified: Secondary | ICD-10-CM | POA: Diagnosis not present

## 2020-02-09 DIAGNOSIS — Z741 Need for assistance with personal care: Secondary | ICD-10-CM | POA: Diagnosis not present

## 2020-02-09 DIAGNOSIS — J9610 Chronic respiratory failure, unspecified whether with hypoxia or hypercapnia: Secondary | ICD-10-CM | POA: Diagnosis not present

## 2020-02-09 DIAGNOSIS — R278 Other lack of coordination: Secondary | ICD-10-CM | POA: Diagnosis not present

## 2020-02-09 DIAGNOSIS — Z48811 Encounter for surgical aftercare following surgery on the nervous system: Secondary | ICD-10-CM | POA: Diagnosis not present

## 2020-02-09 DIAGNOSIS — I10 Essential (primary) hypertension: Secondary | ICD-10-CM | POA: Diagnosis not present

## 2020-02-09 DIAGNOSIS — I5032 Chronic diastolic (congestive) heart failure: Secondary | ICD-10-CM | POA: Diagnosis not present

## 2020-02-09 DIAGNOSIS — R52 Pain, unspecified: Secondary | ICD-10-CM | POA: Diagnosis not present

## 2020-02-09 DIAGNOSIS — W19XXXD Unspecified fall, subsequent encounter: Secondary | ICD-10-CM | POA: Diagnosis not present

## 2020-02-09 DIAGNOSIS — J449 Chronic obstructive pulmonary disease, unspecified: Secondary | ICD-10-CM | POA: Diagnosis not present

## 2020-02-09 DIAGNOSIS — R2681 Unsteadiness on feet: Secondary | ICD-10-CM | POA: Diagnosis not present

## 2020-02-10 DIAGNOSIS — M6281 Muscle weakness (generalized): Secondary | ICD-10-CM | POA: Diagnosis not present

## 2020-02-10 DIAGNOSIS — J449 Chronic obstructive pulmonary disease, unspecified: Secondary | ICD-10-CM | POA: Diagnosis not present

## 2020-02-10 DIAGNOSIS — W19XXXD Unspecified fall, subsequent encounter: Secondary | ICD-10-CM | POA: Diagnosis not present

## 2020-02-10 DIAGNOSIS — M2681 Anterior soft tissue impingement: Secondary | ICD-10-CM | POA: Diagnosis not present

## 2020-02-10 DIAGNOSIS — R278 Other lack of coordination: Secondary | ICD-10-CM | POA: Diagnosis not present

## 2020-02-10 DIAGNOSIS — I5032 Chronic diastolic (congestive) heart failure: Secondary | ICD-10-CM | POA: Diagnosis not present

## 2020-02-10 DIAGNOSIS — R2681 Unsteadiness on feet: Secondary | ICD-10-CM | POA: Diagnosis not present

## 2020-02-10 DIAGNOSIS — Z741 Need for assistance with personal care: Secondary | ICD-10-CM | POA: Diagnosis not present

## 2020-02-10 DIAGNOSIS — Z48811 Encounter for surgical aftercare following surgery on the nervous system: Secondary | ICD-10-CM | POA: Diagnosis not present

## 2020-02-11 DIAGNOSIS — M6281 Muscle weakness (generalized): Secondary | ICD-10-CM | POA: Diagnosis not present

## 2020-02-11 DIAGNOSIS — R2681 Unsteadiness on feet: Secondary | ICD-10-CM | POA: Diagnosis not present

## 2020-02-11 DIAGNOSIS — M2681 Anterior soft tissue impingement: Secondary | ICD-10-CM | POA: Diagnosis not present

## 2020-02-11 DIAGNOSIS — J449 Chronic obstructive pulmonary disease, unspecified: Secondary | ICD-10-CM | POA: Diagnosis not present

## 2020-02-11 DIAGNOSIS — Z48811 Encounter for surgical aftercare following surgery on the nervous system: Secondary | ICD-10-CM | POA: Diagnosis not present

## 2020-02-11 DIAGNOSIS — W19XXXD Unspecified fall, subsequent encounter: Secondary | ICD-10-CM | POA: Diagnosis not present

## 2020-02-11 DIAGNOSIS — Z741 Need for assistance with personal care: Secondary | ICD-10-CM | POA: Diagnosis not present

## 2020-02-11 DIAGNOSIS — I5032 Chronic diastolic (congestive) heart failure: Secondary | ICD-10-CM | POA: Diagnosis not present

## 2020-02-11 DIAGNOSIS — R278 Other lack of coordination: Secondary | ICD-10-CM | POA: Diagnosis not present

## 2020-02-12 DIAGNOSIS — J449 Chronic obstructive pulmonary disease, unspecified: Secondary | ICD-10-CM | POA: Diagnosis not present

## 2020-02-12 DIAGNOSIS — Z48811 Encounter for surgical aftercare following surgery on the nervous system: Secondary | ICD-10-CM | POA: Diagnosis not present

## 2020-02-12 DIAGNOSIS — R2681 Unsteadiness on feet: Secondary | ICD-10-CM | POA: Diagnosis not present

## 2020-02-12 DIAGNOSIS — Z741 Need for assistance with personal care: Secondary | ICD-10-CM | POA: Diagnosis not present

## 2020-02-12 DIAGNOSIS — I5032 Chronic diastolic (congestive) heart failure: Secondary | ICD-10-CM | POA: Diagnosis not present

## 2020-02-12 DIAGNOSIS — M6281 Muscle weakness (generalized): Secondary | ICD-10-CM | POA: Diagnosis not present

## 2020-02-12 DIAGNOSIS — M2681 Anterior soft tissue impingement: Secondary | ICD-10-CM | POA: Diagnosis not present

## 2020-02-12 DIAGNOSIS — W19XXXD Unspecified fall, subsequent encounter: Secondary | ICD-10-CM | POA: Diagnosis not present

## 2020-02-12 DIAGNOSIS — R278 Other lack of coordination: Secondary | ICD-10-CM | POA: Diagnosis not present

## 2020-02-13 DIAGNOSIS — W19XXXD Unspecified fall, subsequent encounter: Secondary | ICD-10-CM | POA: Diagnosis not present

## 2020-02-13 DIAGNOSIS — M2681 Anterior soft tissue impingement: Secondary | ICD-10-CM | POA: Diagnosis not present

## 2020-02-13 DIAGNOSIS — I5032 Chronic diastolic (congestive) heart failure: Secondary | ICD-10-CM | POA: Diagnosis not present

## 2020-02-13 DIAGNOSIS — M6281 Muscle weakness (generalized): Secondary | ICD-10-CM | POA: Diagnosis not present

## 2020-02-13 DIAGNOSIS — J449 Chronic obstructive pulmonary disease, unspecified: Secondary | ICD-10-CM | POA: Diagnosis not present

## 2020-02-13 DIAGNOSIS — Z741 Need for assistance with personal care: Secondary | ICD-10-CM | POA: Diagnosis not present

## 2020-02-13 DIAGNOSIS — Z48811 Encounter for surgical aftercare following surgery on the nervous system: Secondary | ICD-10-CM | POA: Diagnosis not present

## 2020-02-13 DIAGNOSIS — R278 Other lack of coordination: Secondary | ICD-10-CM | POA: Diagnosis not present

## 2020-02-13 DIAGNOSIS — R2681 Unsteadiness on feet: Secondary | ICD-10-CM | POA: Diagnosis not present

## 2020-02-14 DIAGNOSIS — R2681 Unsteadiness on feet: Secondary | ICD-10-CM | POA: Diagnosis not present

## 2020-02-14 DIAGNOSIS — I5032 Chronic diastolic (congestive) heart failure: Secondary | ICD-10-CM | POA: Diagnosis not present

## 2020-02-14 DIAGNOSIS — M6281 Muscle weakness (generalized): Secondary | ICD-10-CM | POA: Diagnosis not present

## 2020-02-14 DIAGNOSIS — R278 Other lack of coordination: Secondary | ICD-10-CM | POA: Diagnosis not present

## 2020-02-14 DIAGNOSIS — M2681 Anterior soft tissue impingement: Secondary | ICD-10-CM | POA: Diagnosis not present

## 2020-02-14 DIAGNOSIS — W19XXXD Unspecified fall, subsequent encounter: Secondary | ICD-10-CM | POA: Diagnosis not present

## 2020-02-14 DIAGNOSIS — Z48811 Encounter for surgical aftercare following surgery on the nervous system: Secondary | ICD-10-CM | POA: Diagnosis not present

## 2020-02-14 DIAGNOSIS — Z741 Need for assistance with personal care: Secondary | ICD-10-CM | POA: Diagnosis not present

## 2020-02-14 DIAGNOSIS — J449 Chronic obstructive pulmonary disease, unspecified: Secondary | ICD-10-CM | POA: Diagnosis not present

## 2020-02-15 DIAGNOSIS — I5032 Chronic diastolic (congestive) heart failure: Secondary | ICD-10-CM | POA: Diagnosis not present

## 2020-02-15 DIAGNOSIS — R2681 Unsteadiness on feet: Secondary | ICD-10-CM | POA: Diagnosis not present

## 2020-02-15 DIAGNOSIS — M6281 Muscle weakness (generalized): Secondary | ICD-10-CM | POA: Diagnosis not present

## 2020-02-15 DIAGNOSIS — Z741 Need for assistance with personal care: Secondary | ICD-10-CM | POA: Diagnosis not present

## 2020-02-15 DIAGNOSIS — R278 Other lack of coordination: Secondary | ICD-10-CM | POA: Diagnosis not present

## 2020-02-15 DIAGNOSIS — W19XXXD Unspecified fall, subsequent encounter: Secondary | ICD-10-CM | POA: Diagnosis not present

## 2020-02-15 DIAGNOSIS — Z48811 Encounter for surgical aftercare following surgery on the nervous system: Secondary | ICD-10-CM | POA: Diagnosis not present

## 2020-02-15 DIAGNOSIS — M2681 Anterior soft tissue impingement: Secondary | ICD-10-CM | POA: Diagnosis not present

## 2020-02-15 DIAGNOSIS — J449 Chronic obstructive pulmonary disease, unspecified: Secondary | ICD-10-CM | POA: Diagnosis not present

## 2020-02-16 DIAGNOSIS — R278 Other lack of coordination: Secondary | ICD-10-CM | POA: Diagnosis not present

## 2020-02-16 DIAGNOSIS — R2681 Unsteadiness on feet: Secondary | ICD-10-CM | POA: Diagnosis not present

## 2020-02-16 DIAGNOSIS — Z48811 Encounter for surgical aftercare following surgery on the nervous system: Secondary | ICD-10-CM | POA: Diagnosis not present

## 2020-02-16 DIAGNOSIS — Z741 Need for assistance with personal care: Secondary | ICD-10-CM | POA: Diagnosis not present

## 2020-02-16 DIAGNOSIS — I5032 Chronic diastolic (congestive) heart failure: Secondary | ICD-10-CM | POA: Diagnosis not present

## 2020-02-16 DIAGNOSIS — W19XXXD Unspecified fall, subsequent encounter: Secondary | ICD-10-CM | POA: Diagnosis not present

## 2020-02-16 DIAGNOSIS — M6281 Muscle weakness (generalized): Secondary | ICD-10-CM | POA: Diagnosis not present

## 2020-02-16 DIAGNOSIS — U071 COVID-19: Secondary | ICD-10-CM | POA: Diagnosis not present

## 2020-02-16 DIAGNOSIS — M2681 Anterior soft tissue impingement: Secondary | ICD-10-CM | POA: Diagnosis not present

## 2020-02-16 DIAGNOSIS — J449 Chronic obstructive pulmonary disease, unspecified: Secondary | ICD-10-CM | POA: Diagnosis not present

## 2020-02-17 DIAGNOSIS — W19XXXD Unspecified fall, subsequent encounter: Secondary | ICD-10-CM | POA: Diagnosis not present

## 2020-02-17 DIAGNOSIS — J449 Chronic obstructive pulmonary disease, unspecified: Secondary | ICD-10-CM | POA: Diagnosis not present

## 2020-02-17 DIAGNOSIS — I5032 Chronic diastolic (congestive) heart failure: Secondary | ICD-10-CM | POA: Diagnosis not present

## 2020-02-17 DIAGNOSIS — M2681 Anterior soft tissue impingement: Secondary | ICD-10-CM | POA: Diagnosis not present

## 2020-02-17 DIAGNOSIS — M6281 Muscle weakness (generalized): Secondary | ICD-10-CM | POA: Diagnosis not present

## 2020-02-17 DIAGNOSIS — Z48811 Encounter for surgical aftercare following surgery on the nervous system: Secondary | ICD-10-CM | POA: Diagnosis not present

## 2020-02-17 DIAGNOSIS — R2681 Unsteadiness on feet: Secondary | ICD-10-CM | POA: Diagnosis not present

## 2020-02-17 DIAGNOSIS — R278 Other lack of coordination: Secondary | ICD-10-CM | POA: Diagnosis not present

## 2020-02-17 DIAGNOSIS — Z741 Need for assistance with personal care: Secondary | ICD-10-CM | POA: Diagnosis not present

## 2020-02-18 DIAGNOSIS — M2681 Anterior soft tissue impingement: Secondary | ICD-10-CM | POA: Diagnosis not present

## 2020-02-18 DIAGNOSIS — I5032 Chronic diastolic (congestive) heart failure: Secondary | ICD-10-CM | POA: Diagnosis not present

## 2020-02-18 DIAGNOSIS — M6281 Muscle weakness (generalized): Secondary | ICD-10-CM | POA: Diagnosis not present

## 2020-02-18 DIAGNOSIS — R278 Other lack of coordination: Secondary | ICD-10-CM | POA: Diagnosis not present

## 2020-02-18 DIAGNOSIS — W19XXXD Unspecified fall, subsequent encounter: Secondary | ICD-10-CM | POA: Diagnosis not present

## 2020-02-18 DIAGNOSIS — R2681 Unsteadiness on feet: Secondary | ICD-10-CM | POA: Diagnosis not present

## 2020-02-18 DIAGNOSIS — J449 Chronic obstructive pulmonary disease, unspecified: Secondary | ICD-10-CM | POA: Diagnosis not present

## 2020-02-18 DIAGNOSIS — Z741 Need for assistance with personal care: Secondary | ICD-10-CM | POA: Diagnosis not present

## 2020-02-18 DIAGNOSIS — Z48811 Encounter for surgical aftercare following surgery on the nervous system: Secondary | ICD-10-CM | POA: Diagnosis not present

## 2020-02-19 DIAGNOSIS — J9610 Chronic respiratory failure, unspecified whether with hypoxia or hypercapnia: Secondary | ICD-10-CM | POA: Diagnosis not present

## 2020-02-19 DIAGNOSIS — R2681 Unsteadiness on feet: Secondary | ICD-10-CM | POA: Diagnosis not present

## 2020-02-19 DIAGNOSIS — R52 Pain, unspecified: Secondary | ICD-10-CM | POA: Diagnosis not present

## 2020-02-19 DIAGNOSIS — E782 Mixed hyperlipidemia: Secondary | ICD-10-CM | POA: Diagnosis not present

## 2020-02-19 DIAGNOSIS — M6281 Muscle weakness (generalized): Secondary | ICD-10-CM | POA: Diagnosis not present

## 2020-02-19 DIAGNOSIS — I10 Essential (primary) hypertension: Secondary | ICD-10-CM | POA: Diagnosis not present

## 2020-02-19 DIAGNOSIS — J449 Chronic obstructive pulmonary disease, unspecified: Secondary | ICD-10-CM | POA: Diagnosis not present

## 2020-02-19 DIAGNOSIS — R262 Difficulty in walking, not elsewhere classified: Secondary | ICD-10-CM | POA: Diagnosis not present

## 2020-02-20 DIAGNOSIS — J449 Chronic obstructive pulmonary disease, unspecified: Secondary | ICD-10-CM | POA: Diagnosis not present

## 2020-02-20 DIAGNOSIS — M2681 Anterior soft tissue impingement: Secondary | ICD-10-CM | POA: Diagnosis not present

## 2020-02-20 DIAGNOSIS — R278 Other lack of coordination: Secondary | ICD-10-CM | POA: Diagnosis not present

## 2020-02-20 DIAGNOSIS — W19XXXD Unspecified fall, subsequent encounter: Secondary | ICD-10-CM | POA: Diagnosis not present

## 2020-02-20 DIAGNOSIS — I5032 Chronic diastolic (congestive) heart failure: Secondary | ICD-10-CM | POA: Diagnosis not present

## 2020-02-20 DIAGNOSIS — Z48811 Encounter for surgical aftercare following surgery on the nervous system: Secondary | ICD-10-CM | POA: Diagnosis not present

## 2020-02-20 DIAGNOSIS — Z741 Need for assistance with personal care: Secondary | ICD-10-CM | POA: Diagnosis not present

## 2020-02-20 DIAGNOSIS — M6281 Muscle weakness (generalized): Secondary | ICD-10-CM | POA: Diagnosis not present

## 2020-02-20 DIAGNOSIS — R2681 Unsteadiness on feet: Secondary | ICD-10-CM | POA: Diagnosis not present

## 2020-02-22 DIAGNOSIS — J449 Chronic obstructive pulmonary disease, unspecified: Secondary | ICD-10-CM | POA: Diagnosis not present

## 2020-02-22 DIAGNOSIS — M6281 Muscle weakness (generalized): Secondary | ICD-10-CM | POA: Diagnosis not present

## 2020-02-22 DIAGNOSIS — Z741 Need for assistance with personal care: Secondary | ICD-10-CM | POA: Diagnosis not present

## 2020-02-22 DIAGNOSIS — R278 Other lack of coordination: Secondary | ICD-10-CM | POA: Diagnosis not present

## 2020-02-22 DIAGNOSIS — M2681 Anterior soft tissue impingement: Secondary | ICD-10-CM | POA: Diagnosis not present

## 2020-02-22 DIAGNOSIS — I5032 Chronic diastolic (congestive) heart failure: Secondary | ICD-10-CM | POA: Diagnosis not present

## 2020-02-22 DIAGNOSIS — W19XXXD Unspecified fall, subsequent encounter: Secondary | ICD-10-CM | POA: Diagnosis not present

## 2020-02-22 DIAGNOSIS — R2681 Unsteadiness on feet: Secondary | ICD-10-CM | POA: Diagnosis not present

## 2020-02-22 DIAGNOSIS — Z48811 Encounter for surgical aftercare following surgery on the nervous system: Secondary | ICD-10-CM | POA: Diagnosis not present

## 2020-02-23 DIAGNOSIS — I5032 Chronic diastolic (congestive) heart failure: Secondary | ICD-10-CM | POA: Diagnosis not present

## 2020-02-23 DIAGNOSIS — U071 COVID-19: Secondary | ICD-10-CM | POA: Diagnosis not present

## 2020-02-23 DIAGNOSIS — W19XXXD Unspecified fall, subsequent encounter: Secondary | ICD-10-CM | POA: Diagnosis not present

## 2020-02-23 DIAGNOSIS — G43909 Migraine, unspecified, not intractable, without status migrainosus: Secondary | ICD-10-CM | POA: Diagnosis not present

## 2020-02-23 DIAGNOSIS — Z48811 Encounter for surgical aftercare following surgery on the nervous system: Secondary | ICD-10-CM | POA: Diagnosis not present

## 2020-02-23 DIAGNOSIS — Z741 Need for assistance with personal care: Secondary | ICD-10-CM | POA: Diagnosis not present

## 2020-02-23 DIAGNOSIS — J449 Chronic obstructive pulmonary disease, unspecified: Secondary | ICD-10-CM | POA: Diagnosis not present

## 2020-02-23 DIAGNOSIS — M6281 Muscle weakness (generalized): Secondary | ICD-10-CM | POA: Diagnosis not present

## 2020-02-23 DIAGNOSIS — R278 Other lack of coordination: Secondary | ICD-10-CM | POA: Diagnosis not present

## 2020-02-23 DIAGNOSIS — R2681 Unsteadiness on feet: Secondary | ICD-10-CM | POA: Diagnosis not present

## 2020-02-23 DIAGNOSIS — S92502A Displaced unspecified fracture of left lesser toe(s), initial encounter for closed fracture: Secondary | ICD-10-CM | POA: Diagnosis not present

## 2020-02-23 DIAGNOSIS — M2681 Anterior soft tissue impingement: Secondary | ICD-10-CM | POA: Diagnosis not present

## 2020-02-24 DIAGNOSIS — R2681 Unsteadiness on feet: Secondary | ICD-10-CM | POA: Diagnosis not present

## 2020-02-24 DIAGNOSIS — M6281 Muscle weakness (generalized): Secondary | ICD-10-CM | POA: Diagnosis not present

## 2020-02-24 DIAGNOSIS — Z741 Need for assistance with personal care: Secondary | ICD-10-CM | POA: Diagnosis not present

## 2020-02-24 DIAGNOSIS — R278 Other lack of coordination: Secondary | ICD-10-CM | POA: Diagnosis not present

## 2020-02-24 DIAGNOSIS — M2681 Anterior soft tissue impingement: Secondary | ICD-10-CM | POA: Diagnosis not present

## 2020-02-24 DIAGNOSIS — W19XXXD Unspecified fall, subsequent encounter: Secondary | ICD-10-CM | POA: Diagnosis not present

## 2020-02-24 DIAGNOSIS — J449 Chronic obstructive pulmonary disease, unspecified: Secondary | ICD-10-CM | POA: Diagnosis not present

## 2020-02-24 DIAGNOSIS — Z48811 Encounter for surgical aftercare following surgery on the nervous system: Secondary | ICD-10-CM | POA: Diagnosis not present

## 2020-02-24 DIAGNOSIS — I5032 Chronic diastolic (congestive) heart failure: Secondary | ICD-10-CM | POA: Diagnosis not present

## 2020-02-25 DIAGNOSIS — I5032 Chronic diastolic (congestive) heart failure: Secondary | ICD-10-CM | POA: Diagnosis not present

## 2020-02-25 DIAGNOSIS — J449 Chronic obstructive pulmonary disease, unspecified: Secondary | ICD-10-CM | POA: Diagnosis not present

## 2020-02-25 DIAGNOSIS — M6281 Muscle weakness (generalized): Secondary | ICD-10-CM | POA: Diagnosis not present

## 2020-02-25 DIAGNOSIS — M2681 Anterior soft tissue impingement: Secondary | ICD-10-CM | POA: Diagnosis not present

## 2020-02-25 DIAGNOSIS — Z48811 Encounter for surgical aftercare following surgery on the nervous system: Secondary | ICD-10-CM | POA: Diagnosis not present

## 2020-02-25 DIAGNOSIS — M25571 Pain in right ankle and joints of right foot: Secondary | ICD-10-CM | POA: Diagnosis not present

## 2020-02-25 DIAGNOSIS — R2681 Unsteadiness on feet: Secondary | ICD-10-CM | POA: Diagnosis not present

## 2020-02-25 DIAGNOSIS — R278 Other lack of coordination: Secondary | ICD-10-CM | POA: Diagnosis not present

## 2020-02-25 DIAGNOSIS — Z741 Need for assistance with personal care: Secondary | ICD-10-CM | POA: Diagnosis not present

## 2020-02-25 DIAGNOSIS — W19XXXD Unspecified fall, subsequent encounter: Secondary | ICD-10-CM | POA: Diagnosis not present

## 2020-02-26 ENCOUNTER — Ambulatory Visit (INDEPENDENT_AMBULATORY_CARE_PROVIDER_SITE_OTHER): Payer: Medicare HMO | Admitting: Podiatry

## 2020-02-26 ENCOUNTER — Other Ambulatory Visit: Payer: Self-pay

## 2020-02-26 ENCOUNTER — Ambulatory Visit (INDEPENDENT_AMBULATORY_CARE_PROVIDER_SITE_OTHER): Payer: Medicare HMO

## 2020-02-26 DIAGNOSIS — Z48811 Encounter for surgical aftercare following surgery on the nervous system: Secondary | ICD-10-CM | POA: Diagnosis not present

## 2020-02-26 DIAGNOSIS — R2681 Unsteadiness on feet: Secondary | ICD-10-CM | POA: Diagnosis not present

## 2020-02-26 DIAGNOSIS — R278 Other lack of coordination: Secondary | ICD-10-CM | POA: Diagnosis not present

## 2020-02-26 DIAGNOSIS — U071 COVID-19: Secondary | ICD-10-CM

## 2020-02-26 DIAGNOSIS — Z741 Need for assistance with personal care: Secondary | ICD-10-CM | POA: Diagnosis not present

## 2020-02-26 DIAGNOSIS — R600 Localized edema: Secondary | ICD-10-CM

## 2020-02-26 DIAGNOSIS — S92512A Displaced fracture of proximal phalanx of left lesser toe(s), initial encounter for closed fracture: Secondary | ICD-10-CM

## 2020-02-26 DIAGNOSIS — R238 Other skin changes: Secondary | ICD-10-CM | POA: Diagnosis not present

## 2020-02-26 DIAGNOSIS — J9610 Chronic respiratory failure, unspecified whether with hypoxia or hypercapnia: Secondary | ICD-10-CM | POA: Diagnosis not present

## 2020-02-26 DIAGNOSIS — R52 Pain, unspecified: Secondary | ICD-10-CM | POA: Diagnosis not present

## 2020-02-26 DIAGNOSIS — R262 Difficulty in walking, not elsewhere classified: Secondary | ICD-10-CM | POA: Diagnosis not present

## 2020-02-26 DIAGNOSIS — M170 Bilateral primary osteoarthritis of knee: Secondary | ICD-10-CM | POA: Diagnosis not present

## 2020-02-26 DIAGNOSIS — I872 Venous insufficiency (chronic) (peripheral): Secondary | ICD-10-CM

## 2020-02-26 DIAGNOSIS — E782 Mixed hyperlipidemia: Secondary | ICD-10-CM | POA: Diagnosis not present

## 2020-02-26 DIAGNOSIS — M2681 Anterior soft tissue impingement: Secondary | ICD-10-CM | POA: Diagnosis not present

## 2020-02-26 DIAGNOSIS — M6281 Muscle weakness (generalized): Secondary | ICD-10-CM | POA: Diagnosis not present

## 2020-02-26 DIAGNOSIS — I5032 Chronic diastolic (congestive) heart failure: Secondary | ICD-10-CM | POA: Diagnosis not present

## 2020-02-26 DIAGNOSIS — W19XXXD Unspecified fall, subsequent encounter: Secondary | ICD-10-CM | POA: Diagnosis not present

## 2020-02-26 DIAGNOSIS — I10 Essential (primary) hypertension: Secondary | ICD-10-CM | POA: Diagnosis not present

## 2020-02-26 DIAGNOSIS — J449 Chronic obstructive pulmonary disease, unspecified: Secondary | ICD-10-CM | POA: Diagnosis not present

## 2020-02-26 DIAGNOSIS — S92412P Displaced fracture of proximal phalanx of left great toe, subsequent encounter for fracture with malunion: Secondary | ICD-10-CM

## 2020-02-26 NOTE — Progress Notes (Signed)
  Subjective:  Patient ID: Lindsey Rowe, female    DOB: [birth year],  MRN: [id number]  Chief Complaint  Patient presents with  . Foot Injury    Pt states healing well, denies fever/nausea/vomiting/chills. Pt has some reddish discoloration of the toes. Admits to diagnosis of Covid-19 last week.   75 y.o. female presents with the above complaint. History confirmed with patient.   Objective:  Physical Exam: warm, good capillary refill, nail exam onychomycosis of the toenails, no trophic changes or ulcerative lesions, normal DP and PT pulses and normal sensory exam. Left Foot: No POP left 5th toe. Pallor to the 2nd/3rd toes, reduce   Radiographs: X-ray of the left foot: angulation of the proximal phalanx but full healing noted. Assessment:   1. Displaced fracture of proximal phalanx of left great toe, subsequent encounter for fracture with malunion    Plan:  Patient was evaluated and treated and all questions answered.  Fracture left 5th toe -Appears healed, no pain -Transition to normal shoegear -F/u should issues persist  RLE edema -Dispense surgigrip.  COVID Toes -Asymptomatic, discussed should resolve with time.  No follow-ups on file.

## 2020-02-27 DIAGNOSIS — R278 Other lack of coordination: Secondary | ICD-10-CM | POA: Diagnosis not present

## 2020-02-27 DIAGNOSIS — Z48811 Encounter for surgical aftercare following surgery on the nervous system: Secondary | ICD-10-CM | POA: Diagnosis not present

## 2020-02-27 DIAGNOSIS — J449 Chronic obstructive pulmonary disease, unspecified: Secondary | ICD-10-CM | POA: Diagnosis not present

## 2020-02-27 DIAGNOSIS — W19XXXD Unspecified fall, subsequent encounter: Secondary | ICD-10-CM | POA: Diagnosis not present

## 2020-02-27 DIAGNOSIS — M2681 Anterior soft tissue impingement: Secondary | ICD-10-CM | POA: Diagnosis not present

## 2020-02-27 DIAGNOSIS — R2681 Unsteadiness on feet: Secondary | ICD-10-CM | POA: Diagnosis not present

## 2020-02-27 DIAGNOSIS — M6281 Muscle weakness (generalized): Secondary | ICD-10-CM | POA: Diagnosis not present

## 2020-02-27 DIAGNOSIS — I5032 Chronic diastolic (congestive) heart failure: Secondary | ICD-10-CM | POA: Diagnosis not present

## 2020-02-27 DIAGNOSIS — Z741 Need for assistance with personal care: Secondary | ICD-10-CM | POA: Diagnosis not present

## 2020-02-29 DIAGNOSIS — I5032 Chronic diastolic (congestive) heart failure: Secondary | ICD-10-CM | POA: Diagnosis not present

## 2020-02-29 DIAGNOSIS — J449 Chronic obstructive pulmonary disease, unspecified: Secondary | ICD-10-CM | POA: Diagnosis not present

## 2020-02-29 DIAGNOSIS — R2681 Unsteadiness on feet: Secondary | ICD-10-CM | POA: Diagnosis not present

## 2020-02-29 DIAGNOSIS — M6281 Muscle weakness (generalized): Secondary | ICD-10-CM | POA: Diagnosis not present

## 2020-02-29 DIAGNOSIS — W19XXXD Unspecified fall, subsequent encounter: Secondary | ICD-10-CM | POA: Diagnosis not present

## 2020-02-29 DIAGNOSIS — Z741 Need for assistance with personal care: Secondary | ICD-10-CM | POA: Diagnosis not present

## 2020-02-29 DIAGNOSIS — S92502A Displaced unspecified fracture of left lesser toe(s), initial encounter for closed fracture: Secondary | ICD-10-CM | POA: Diagnosis not present

## 2020-02-29 DIAGNOSIS — M2681 Anterior soft tissue impingement: Secondary | ICD-10-CM | POA: Diagnosis not present

## 2020-02-29 DIAGNOSIS — M25571 Pain in right ankle and joints of right foot: Secondary | ICD-10-CM | POA: Diagnosis not present

## 2020-02-29 DIAGNOSIS — Z48811 Encounter for surgical aftercare following surgery on the nervous system: Secondary | ICD-10-CM | POA: Diagnosis not present

## 2020-02-29 DIAGNOSIS — U071 COVID-19: Secondary | ICD-10-CM | POA: Diagnosis not present

## 2020-02-29 DIAGNOSIS — R278 Other lack of coordination: Secondary | ICD-10-CM | POA: Diagnosis not present

## 2020-03-01 DIAGNOSIS — I10 Essential (primary) hypertension: Secondary | ICD-10-CM | POA: Diagnosis not present

## 2020-03-01 DIAGNOSIS — J9610 Chronic respiratory failure, unspecified whether with hypoxia or hypercapnia: Secondary | ICD-10-CM | POA: Diagnosis not present

## 2020-03-01 DIAGNOSIS — W19XXXD Unspecified fall, subsequent encounter: Secondary | ICD-10-CM | POA: Diagnosis not present

## 2020-03-01 DIAGNOSIS — M2681 Anterior soft tissue impingement: Secondary | ICD-10-CM | POA: Diagnosis not present

## 2020-03-01 DIAGNOSIS — M6281 Muscle weakness (generalized): Secondary | ICD-10-CM | POA: Diagnosis not present

## 2020-03-01 DIAGNOSIS — Z48811 Encounter for surgical aftercare following surgery on the nervous system: Secondary | ICD-10-CM | POA: Diagnosis not present

## 2020-03-01 DIAGNOSIS — R278 Other lack of coordination: Secondary | ICD-10-CM | POA: Diagnosis not present

## 2020-03-01 DIAGNOSIS — Z741 Need for assistance with personal care: Secondary | ICD-10-CM | POA: Diagnosis not present

## 2020-03-01 DIAGNOSIS — J449 Chronic obstructive pulmonary disease, unspecified: Secondary | ICD-10-CM | POA: Diagnosis not present

## 2020-03-01 DIAGNOSIS — I5032 Chronic diastolic (congestive) heart failure: Secondary | ICD-10-CM | POA: Diagnosis not present

## 2020-03-01 DIAGNOSIS — E782 Mixed hyperlipidemia: Secondary | ICD-10-CM | POA: Diagnosis not present

## 2020-03-01 DIAGNOSIS — R2681 Unsteadiness on feet: Secondary | ICD-10-CM | POA: Diagnosis not present

## 2020-03-01 DIAGNOSIS — R262 Difficulty in walking, not elsewhere classified: Secondary | ICD-10-CM | POA: Diagnosis not present

## 2020-03-01 DIAGNOSIS — R52 Pain, unspecified: Secondary | ICD-10-CM | POA: Diagnosis not present

## 2020-03-02 DIAGNOSIS — M2681 Anterior soft tissue impingement: Secondary | ICD-10-CM | POA: Diagnosis not present

## 2020-03-02 DIAGNOSIS — Z741 Need for assistance with personal care: Secondary | ICD-10-CM | POA: Diagnosis not present

## 2020-03-02 DIAGNOSIS — Z48811 Encounter for surgical aftercare following surgery on the nervous system: Secondary | ICD-10-CM | POA: Diagnosis not present

## 2020-03-02 DIAGNOSIS — J449 Chronic obstructive pulmonary disease, unspecified: Secondary | ICD-10-CM | POA: Diagnosis not present

## 2020-03-02 DIAGNOSIS — W19XXXD Unspecified fall, subsequent encounter: Secondary | ICD-10-CM | POA: Diagnosis not present

## 2020-03-02 DIAGNOSIS — I5032 Chronic diastolic (congestive) heart failure: Secondary | ICD-10-CM | POA: Diagnosis not present

## 2020-03-02 DIAGNOSIS — M6281 Muscle weakness (generalized): Secondary | ICD-10-CM | POA: Diagnosis not present

## 2020-03-02 DIAGNOSIS — R2681 Unsteadiness on feet: Secondary | ICD-10-CM | POA: Diagnosis not present

## 2020-03-02 DIAGNOSIS — R278 Other lack of coordination: Secondary | ICD-10-CM | POA: Diagnosis not present

## 2020-03-03 DIAGNOSIS — Z741 Need for assistance with personal care: Secondary | ICD-10-CM | POA: Diagnosis not present

## 2020-03-03 DIAGNOSIS — Z48811 Encounter for surgical aftercare following surgery on the nervous system: Secondary | ICD-10-CM | POA: Diagnosis not present

## 2020-03-03 DIAGNOSIS — M6281 Muscle weakness (generalized): Secondary | ICD-10-CM | POA: Diagnosis not present

## 2020-03-03 DIAGNOSIS — W19XXXD Unspecified fall, subsequent encounter: Secondary | ICD-10-CM | POA: Diagnosis not present

## 2020-03-03 DIAGNOSIS — R2681 Unsteadiness on feet: Secondary | ICD-10-CM | POA: Diagnosis not present

## 2020-03-03 DIAGNOSIS — M2681 Anterior soft tissue impingement: Secondary | ICD-10-CM | POA: Diagnosis not present

## 2020-03-03 DIAGNOSIS — R278 Other lack of coordination: Secondary | ICD-10-CM | POA: Diagnosis not present

## 2020-03-03 DIAGNOSIS — I5032 Chronic diastolic (congestive) heart failure: Secondary | ICD-10-CM | POA: Diagnosis not present

## 2020-03-03 DIAGNOSIS — J449 Chronic obstructive pulmonary disease, unspecified: Secondary | ICD-10-CM | POA: Diagnosis not present

## 2020-03-04 DIAGNOSIS — G43909 Migraine, unspecified, not intractable, without status migrainosus: Secondary | ICD-10-CM | POA: Diagnosis not present

## 2020-03-04 DIAGNOSIS — R2681 Unsteadiness on feet: Secondary | ICD-10-CM | POA: Diagnosis not present

## 2020-03-04 DIAGNOSIS — M6281 Muscle weakness (generalized): Secondary | ICD-10-CM | POA: Diagnosis not present

## 2020-03-04 DIAGNOSIS — R278 Other lack of coordination: Secondary | ICD-10-CM | POA: Diagnosis not present

## 2020-03-04 DIAGNOSIS — I5032 Chronic diastolic (congestive) heart failure: Secondary | ICD-10-CM | POA: Diagnosis not present

## 2020-03-04 DIAGNOSIS — M2681 Anterior soft tissue impingement: Secondary | ICD-10-CM | POA: Diagnosis not present

## 2020-03-04 DIAGNOSIS — Z741 Need for assistance with personal care: Secondary | ICD-10-CM | POA: Diagnosis not present

## 2020-03-04 DIAGNOSIS — J449 Chronic obstructive pulmonary disease, unspecified: Secondary | ICD-10-CM | POA: Diagnosis not present

## 2020-03-04 DIAGNOSIS — Z48811 Encounter for surgical aftercare following surgery on the nervous system: Secondary | ICD-10-CM | POA: Diagnosis not present

## 2020-03-04 DIAGNOSIS — W19XXXD Unspecified fall, subsequent encounter: Secondary | ICD-10-CM | POA: Diagnosis not present

## 2020-03-05 DIAGNOSIS — I5032 Chronic diastolic (congestive) heart failure: Secondary | ICD-10-CM | POA: Diagnosis not present

## 2020-03-05 DIAGNOSIS — M2681 Anterior soft tissue impingement: Secondary | ICD-10-CM | POA: Diagnosis not present

## 2020-03-05 DIAGNOSIS — R2681 Unsteadiness on feet: Secondary | ICD-10-CM | POA: Diagnosis not present

## 2020-03-05 DIAGNOSIS — Z48811 Encounter for surgical aftercare following surgery on the nervous system: Secondary | ICD-10-CM | POA: Diagnosis not present

## 2020-03-05 DIAGNOSIS — M6281 Muscle weakness (generalized): Secondary | ICD-10-CM | POA: Diagnosis not present

## 2020-03-05 DIAGNOSIS — J449 Chronic obstructive pulmonary disease, unspecified: Secondary | ICD-10-CM | POA: Diagnosis not present

## 2020-03-05 DIAGNOSIS — Z741 Need for assistance with personal care: Secondary | ICD-10-CM | POA: Diagnosis not present

## 2020-03-05 DIAGNOSIS — R278 Other lack of coordination: Secondary | ICD-10-CM | POA: Diagnosis not present

## 2020-03-05 DIAGNOSIS — W19XXXD Unspecified fall, subsequent encounter: Secondary | ICD-10-CM | POA: Diagnosis not present

## 2020-03-06 DIAGNOSIS — R0902 Hypoxemia: Secondary | ICD-10-CM | POA: Diagnosis not present

## 2020-03-06 DIAGNOSIS — J9601 Acute respiratory failure with hypoxia: Secondary | ICD-10-CM | POA: Diagnosis not present

## 2020-03-06 DIAGNOSIS — J45909 Unspecified asthma, uncomplicated: Secondary | ICD-10-CM | POA: Diagnosis not present

## 2020-03-07 DIAGNOSIS — Z741 Need for assistance with personal care: Secondary | ICD-10-CM | POA: Diagnosis not present

## 2020-03-07 DIAGNOSIS — M6281 Muscle weakness (generalized): Secondary | ICD-10-CM | POA: Diagnosis not present

## 2020-03-07 DIAGNOSIS — R2681 Unsteadiness on feet: Secondary | ICD-10-CM | POA: Diagnosis not present

## 2020-03-07 DIAGNOSIS — R278 Other lack of coordination: Secondary | ICD-10-CM | POA: Diagnosis not present

## 2020-03-07 DIAGNOSIS — J449 Chronic obstructive pulmonary disease, unspecified: Secondary | ICD-10-CM | POA: Diagnosis not present

## 2020-03-07 DIAGNOSIS — M2681 Anterior soft tissue impingement: Secondary | ICD-10-CM | POA: Diagnosis not present

## 2020-03-07 DIAGNOSIS — I5032 Chronic diastolic (congestive) heart failure: Secondary | ICD-10-CM | POA: Diagnosis not present

## 2020-03-07 DIAGNOSIS — Z48811 Encounter for surgical aftercare following surgery on the nervous system: Secondary | ICD-10-CM | POA: Diagnosis not present

## 2020-03-07 DIAGNOSIS — W19XXXD Unspecified fall, subsequent encounter: Secondary | ICD-10-CM | POA: Diagnosis not present

## 2020-03-08 DIAGNOSIS — R278 Other lack of coordination: Secondary | ICD-10-CM | POA: Diagnosis not present

## 2020-03-08 DIAGNOSIS — W19XXXD Unspecified fall, subsequent encounter: Secondary | ICD-10-CM | POA: Diagnosis not present

## 2020-03-08 DIAGNOSIS — E782 Mixed hyperlipidemia: Secondary | ICD-10-CM | POA: Diagnosis not present

## 2020-03-08 DIAGNOSIS — M2681 Anterior soft tissue impingement: Secondary | ICD-10-CM | POA: Diagnosis not present

## 2020-03-08 DIAGNOSIS — R52 Pain, unspecified: Secondary | ICD-10-CM | POA: Diagnosis not present

## 2020-03-08 DIAGNOSIS — Z741 Need for assistance with personal care: Secondary | ICD-10-CM | POA: Diagnosis not present

## 2020-03-08 DIAGNOSIS — R262 Difficulty in walking, not elsewhere classified: Secondary | ICD-10-CM | POA: Diagnosis not present

## 2020-03-08 DIAGNOSIS — I5032 Chronic diastolic (congestive) heart failure: Secondary | ICD-10-CM | POA: Diagnosis not present

## 2020-03-08 DIAGNOSIS — M6281 Muscle weakness (generalized): Secondary | ICD-10-CM | POA: Diagnosis not present

## 2020-03-08 DIAGNOSIS — J9610 Chronic respiratory failure, unspecified whether with hypoxia or hypercapnia: Secondary | ICD-10-CM | POA: Diagnosis not present

## 2020-03-08 DIAGNOSIS — I10 Essential (primary) hypertension: Secondary | ICD-10-CM | POA: Diagnosis not present

## 2020-03-08 DIAGNOSIS — J449 Chronic obstructive pulmonary disease, unspecified: Secondary | ICD-10-CM | POA: Diagnosis not present

## 2020-03-08 DIAGNOSIS — Z48811 Encounter for surgical aftercare following surgery on the nervous system: Secondary | ICD-10-CM | POA: Diagnosis not present

## 2020-03-08 DIAGNOSIS — R2681 Unsteadiness on feet: Secondary | ICD-10-CM | POA: Diagnosis not present

## 2020-03-09 DIAGNOSIS — J449 Chronic obstructive pulmonary disease, unspecified: Secondary | ICD-10-CM | POA: Diagnosis not present

## 2020-03-09 DIAGNOSIS — M6281 Muscle weakness (generalized): Secondary | ICD-10-CM | POA: Diagnosis not present

## 2020-03-09 DIAGNOSIS — M2681 Anterior soft tissue impingement: Secondary | ICD-10-CM | POA: Diagnosis not present

## 2020-03-09 DIAGNOSIS — R2681 Unsteadiness on feet: Secondary | ICD-10-CM | POA: Diagnosis not present

## 2020-03-09 DIAGNOSIS — I5032 Chronic diastolic (congestive) heart failure: Secondary | ICD-10-CM | POA: Diagnosis not present

## 2020-03-09 DIAGNOSIS — R278 Other lack of coordination: Secondary | ICD-10-CM | POA: Diagnosis not present

## 2020-03-09 DIAGNOSIS — Z48811 Encounter for surgical aftercare following surgery on the nervous system: Secondary | ICD-10-CM | POA: Diagnosis not present

## 2020-03-09 DIAGNOSIS — Z741 Need for assistance with personal care: Secondary | ICD-10-CM | POA: Diagnosis not present

## 2020-03-09 DIAGNOSIS — W19XXXD Unspecified fall, subsequent encounter: Secondary | ICD-10-CM | POA: Diagnosis not present

## 2020-03-10 DIAGNOSIS — W19XXXD Unspecified fall, subsequent encounter: Secondary | ICD-10-CM | POA: Diagnosis not present

## 2020-03-10 DIAGNOSIS — M6281 Muscle weakness (generalized): Secondary | ICD-10-CM | POA: Diagnosis not present

## 2020-03-10 DIAGNOSIS — M2681 Anterior soft tissue impingement: Secondary | ICD-10-CM | POA: Diagnosis not present

## 2020-03-10 DIAGNOSIS — R278 Other lack of coordination: Secondary | ICD-10-CM | POA: Diagnosis not present

## 2020-03-10 DIAGNOSIS — R2681 Unsteadiness on feet: Secondary | ICD-10-CM | POA: Diagnosis not present

## 2020-03-10 DIAGNOSIS — I5032 Chronic diastolic (congestive) heart failure: Secondary | ICD-10-CM | POA: Diagnosis not present

## 2020-03-10 DIAGNOSIS — J449 Chronic obstructive pulmonary disease, unspecified: Secondary | ICD-10-CM | POA: Diagnosis not present

## 2020-03-10 DIAGNOSIS — Z48811 Encounter for surgical aftercare following surgery on the nervous system: Secondary | ICD-10-CM | POA: Diagnosis not present

## 2020-03-10 DIAGNOSIS — Z741 Need for assistance with personal care: Secondary | ICD-10-CM | POA: Diagnosis not present

## 2020-03-11 DIAGNOSIS — Z48811 Encounter for surgical aftercare following surgery on the nervous system: Secondary | ICD-10-CM | POA: Diagnosis not present

## 2020-03-11 DIAGNOSIS — I5032 Chronic diastolic (congestive) heart failure: Secondary | ICD-10-CM | POA: Diagnosis not present

## 2020-03-11 DIAGNOSIS — W19XXXD Unspecified fall, subsequent encounter: Secondary | ICD-10-CM | POA: Diagnosis not present

## 2020-03-11 DIAGNOSIS — M6281 Muscle weakness (generalized): Secondary | ICD-10-CM | POA: Diagnosis not present

## 2020-03-11 DIAGNOSIS — R278 Other lack of coordination: Secondary | ICD-10-CM | POA: Diagnosis not present

## 2020-03-11 DIAGNOSIS — J449 Chronic obstructive pulmonary disease, unspecified: Secondary | ICD-10-CM | POA: Diagnosis not present

## 2020-03-11 DIAGNOSIS — R2681 Unsteadiness on feet: Secondary | ICD-10-CM | POA: Diagnosis not present

## 2020-03-11 DIAGNOSIS — M2681 Anterior soft tissue impingement: Secondary | ICD-10-CM | POA: Diagnosis not present

## 2020-03-11 DIAGNOSIS — Z741 Need for assistance with personal care: Secondary | ICD-10-CM | POA: Diagnosis not present

## 2020-03-12 DIAGNOSIS — Z741 Need for assistance with personal care: Secondary | ICD-10-CM | POA: Diagnosis not present

## 2020-03-12 DIAGNOSIS — R2681 Unsteadiness on feet: Secondary | ICD-10-CM | POA: Diagnosis not present

## 2020-03-12 DIAGNOSIS — I5032 Chronic diastolic (congestive) heart failure: Secondary | ICD-10-CM | POA: Diagnosis not present

## 2020-03-12 DIAGNOSIS — Z48811 Encounter for surgical aftercare following surgery on the nervous system: Secondary | ICD-10-CM | POA: Diagnosis not present

## 2020-03-12 DIAGNOSIS — R278 Other lack of coordination: Secondary | ICD-10-CM | POA: Diagnosis not present

## 2020-03-12 DIAGNOSIS — W19XXXD Unspecified fall, subsequent encounter: Secondary | ICD-10-CM | POA: Diagnosis not present

## 2020-03-12 DIAGNOSIS — J449 Chronic obstructive pulmonary disease, unspecified: Secondary | ICD-10-CM | POA: Diagnosis not present

## 2020-03-12 DIAGNOSIS — M6281 Muscle weakness (generalized): Secondary | ICD-10-CM | POA: Diagnosis not present

## 2020-03-12 DIAGNOSIS — M2681 Anterior soft tissue impingement: Secondary | ICD-10-CM | POA: Diagnosis not present

## 2020-03-14 DIAGNOSIS — F39 Unspecified mood [affective] disorder: Secondary | ICD-10-CM | POA: Diagnosis not present

## 2020-03-14 DIAGNOSIS — R278 Other lack of coordination: Secondary | ICD-10-CM | POA: Diagnosis not present

## 2020-03-14 DIAGNOSIS — R059 Cough, unspecified: Secondary | ICD-10-CM | POA: Diagnosis not present

## 2020-03-14 DIAGNOSIS — J449 Chronic obstructive pulmonary disease, unspecified: Secondary | ICD-10-CM | POA: Diagnosis not present

## 2020-03-14 DIAGNOSIS — R0989 Other specified symptoms and signs involving the circulatory and respiratory systems: Secondary | ICD-10-CM | POA: Diagnosis not present

## 2020-03-14 DIAGNOSIS — R0902 Hypoxemia: Secondary | ICD-10-CM | POA: Diagnosis not present

## 2020-03-14 DIAGNOSIS — M2681 Anterior soft tissue impingement: Secondary | ICD-10-CM | POA: Diagnosis not present

## 2020-03-14 DIAGNOSIS — Z48811 Encounter for surgical aftercare following surgery on the nervous system: Secondary | ICD-10-CM | POA: Diagnosis not present

## 2020-03-14 DIAGNOSIS — I10 Essential (primary) hypertension: Secondary | ICD-10-CM | POA: Diagnosis not present

## 2020-03-14 DIAGNOSIS — R2681 Unsteadiness on feet: Secondary | ICD-10-CM | POA: Diagnosis not present

## 2020-03-14 DIAGNOSIS — F4323 Adjustment disorder with mixed anxiety and depressed mood: Secondary | ICD-10-CM | POA: Diagnosis not present

## 2020-03-14 DIAGNOSIS — I5032 Chronic diastolic (congestive) heart failure: Secondary | ICD-10-CM | POA: Diagnosis not present

## 2020-03-14 DIAGNOSIS — G4700 Insomnia, unspecified: Secondary | ICD-10-CM | POA: Diagnosis not present

## 2020-03-14 DIAGNOSIS — U071 COVID-19: Secondary | ICD-10-CM | POA: Diagnosis not present

## 2020-03-14 DIAGNOSIS — W19XXXD Unspecified fall, subsequent encounter: Secondary | ICD-10-CM | POA: Diagnosis not present

## 2020-03-14 DIAGNOSIS — E559 Vitamin D deficiency, unspecified: Secondary | ICD-10-CM | POA: Diagnosis not present

## 2020-03-14 DIAGNOSIS — M6281 Muscle weakness (generalized): Secondary | ICD-10-CM | POA: Diagnosis not present

## 2020-03-14 DIAGNOSIS — Z741 Need for assistance with personal care: Secondary | ICD-10-CM | POA: Diagnosis not present

## 2020-03-15 DIAGNOSIS — I5032 Chronic diastolic (congestive) heart failure: Secondary | ICD-10-CM | POA: Diagnosis not present

## 2020-03-15 DIAGNOSIS — Z48811 Encounter for surgical aftercare following surgery on the nervous system: Secondary | ICD-10-CM | POA: Diagnosis not present

## 2020-03-15 DIAGNOSIS — R278 Other lack of coordination: Secondary | ICD-10-CM | POA: Diagnosis not present

## 2020-03-15 DIAGNOSIS — M6281 Muscle weakness (generalized): Secondary | ICD-10-CM | POA: Diagnosis not present

## 2020-03-15 DIAGNOSIS — W19XXXD Unspecified fall, subsequent encounter: Secondary | ICD-10-CM | POA: Diagnosis not present

## 2020-03-15 DIAGNOSIS — M2681 Anterior soft tissue impingement: Secondary | ICD-10-CM | POA: Diagnosis not present

## 2020-03-15 DIAGNOSIS — J449 Chronic obstructive pulmonary disease, unspecified: Secondary | ICD-10-CM | POA: Diagnosis not present

## 2020-03-15 DIAGNOSIS — R2681 Unsteadiness on feet: Secondary | ICD-10-CM | POA: Diagnosis not present

## 2020-03-15 DIAGNOSIS — Z741 Need for assistance with personal care: Secondary | ICD-10-CM | POA: Diagnosis not present

## 2020-03-16 DIAGNOSIS — R278 Other lack of coordination: Secondary | ICD-10-CM | POA: Diagnosis not present

## 2020-03-16 DIAGNOSIS — Z741 Need for assistance with personal care: Secondary | ICD-10-CM | POA: Diagnosis not present

## 2020-03-16 DIAGNOSIS — M6281 Muscle weakness (generalized): Secondary | ICD-10-CM | POA: Diagnosis not present

## 2020-03-16 DIAGNOSIS — R2681 Unsteadiness on feet: Secondary | ICD-10-CM | POA: Diagnosis not present

## 2020-03-16 DIAGNOSIS — I5032 Chronic diastolic (congestive) heart failure: Secondary | ICD-10-CM | POA: Diagnosis not present

## 2020-03-16 DIAGNOSIS — W19XXXD Unspecified fall, subsequent encounter: Secondary | ICD-10-CM | POA: Diagnosis not present

## 2020-03-16 DIAGNOSIS — H1789 Other corneal scars and opacities: Secondary | ICD-10-CM | POA: Diagnosis not present

## 2020-03-16 DIAGNOSIS — M2681 Anterior soft tissue impingement: Secondary | ICD-10-CM | POA: Diagnosis not present

## 2020-03-16 DIAGNOSIS — H1820 Unspecified corneal edema: Secondary | ICD-10-CM | POA: Diagnosis not present

## 2020-03-16 DIAGNOSIS — Z48811 Encounter for surgical aftercare following surgery on the nervous system: Secondary | ICD-10-CM | POA: Diagnosis not present

## 2020-03-16 DIAGNOSIS — J449 Chronic obstructive pulmonary disease, unspecified: Secondary | ICD-10-CM | POA: Diagnosis not present

## 2020-03-17 DIAGNOSIS — R278 Other lack of coordination: Secondary | ICD-10-CM | POA: Diagnosis not present

## 2020-03-17 DIAGNOSIS — M6281 Muscle weakness (generalized): Secondary | ICD-10-CM | POA: Diagnosis not present

## 2020-03-17 DIAGNOSIS — M2681 Anterior soft tissue impingement: Secondary | ICD-10-CM | POA: Diagnosis not present

## 2020-03-17 DIAGNOSIS — R2681 Unsteadiness on feet: Secondary | ICD-10-CM | POA: Diagnosis not present

## 2020-03-17 DIAGNOSIS — J449 Chronic obstructive pulmonary disease, unspecified: Secondary | ICD-10-CM | POA: Diagnosis not present

## 2020-03-17 DIAGNOSIS — F339 Major depressive disorder, recurrent, unspecified: Secondary | ICD-10-CM | POA: Diagnosis not present

## 2020-03-17 DIAGNOSIS — Z741 Need for assistance with personal care: Secondary | ICD-10-CM | POA: Diagnosis not present

## 2020-03-17 DIAGNOSIS — W19XXXD Unspecified fall, subsequent encounter: Secondary | ICD-10-CM | POA: Diagnosis not present

## 2020-03-17 DIAGNOSIS — I5032 Chronic diastolic (congestive) heart failure: Secondary | ICD-10-CM | POA: Diagnosis not present

## 2020-03-17 DIAGNOSIS — Z48811 Encounter for surgical aftercare following surgery on the nervous system: Secondary | ICD-10-CM | POA: Diagnosis not present

## 2020-03-18 DIAGNOSIS — I5032 Chronic diastolic (congestive) heart failure: Secondary | ICD-10-CM | POA: Diagnosis not present

## 2020-03-18 DIAGNOSIS — Z48811 Encounter for surgical aftercare following surgery on the nervous system: Secondary | ICD-10-CM | POA: Diagnosis not present

## 2020-03-18 DIAGNOSIS — W19XXXD Unspecified fall, subsequent encounter: Secondary | ICD-10-CM | POA: Diagnosis not present

## 2020-03-18 DIAGNOSIS — J449 Chronic obstructive pulmonary disease, unspecified: Secondary | ICD-10-CM | POA: Diagnosis not present

## 2020-03-18 DIAGNOSIS — R2681 Unsteadiness on feet: Secondary | ICD-10-CM | POA: Diagnosis not present

## 2020-03-18 DIAGNOSIS — M2681 Anterior soft tissue impingement: Secondary | ICD-10-CM | POA: Diagnosis not present

## 2020-03-18 DIAGNOSIS — R278 Other lack of coordination: Secondary | ICD-10-CM | POA: Diagnosis not present

## 2020-03-18 DIAGNOSIS — Z741 Need for assistance with personal care: Secondary | ICD-10-CM | POA: Diagnosis not present

## 2020-03-18 DIAGNOSIS — M6281 Muscle weakness (generalized): Secondary | ICD-10-CM | POA: Diagnosis not present

## 2020-03-19 DIAGNOSIS — I5032 Chronic diastolic (congestive) heart failure: Secondary | ICD-10-CM | POA: Diagnosis not present

## 2020-03-19 DIAGNOSIS — M6281 Muscle weakness (generalized): Secondary | ICD-10-CM | POA: Diagnosis not present

## 2020-03-19 DIAGNOSIS — Z48811 Encounter for surgical aftercare following surgery on the nervous system: Secondary | ICD-10-CM | POA: Diagnosis not present

## 2020-03-19 DIAGNOSIS — J449 Chronic obstructive pulmonary disease, unspecified: Secondary | ICD-10-CM | POA: Diagnosis not present

## 2020-03-19 DIAGNOSIS — Z741 Need for assistance with personal care: Secondary | ICD-10-CM | POA: Diagnosis not present

## 2020-03-19 DIAGNOSIS — R2681 Unsteadiness on feet: Secondary | ICD-10-CM | POA: Diagnosis not present

## 2020-03-19 DIAGNOSIS — W19XXXD Unspecified fall, subsequent encounter: Secondary | ICD-10-CM | POA: Diagnosis not present

## 2020-03-19 DIAGNOSIS — M2681 Anterior soft tissue impingement: Secondary | ICD-10-CM | POA: Diagnosis not present

## 2020-03-19 DIAGNOSIS — R278 Other lack of coordination: Secondary | ICD-10-CM | POA: Diagnosis not present

## 2020-03-20 DIAGNOSIS — Z48811 Encounter for surgical aftercare following surgery on the nervous system: Secondary | ICD-10-CM | POA: Diagnosis not present

## 2020-03-20 DIAGNOSIS — M2681 Anterior soft tissue impingement: Secondary | ICD-10-CM | POA: Diagnosis not present

## 2020-03-20 DIAGNOSIS — M6281 Muscle weakness (generalized): Secondary | ICD-10-CM | POA: Diagnosis not present

## 2020-03-20 DIAGNOSIS — W19XXXD Unspecified fall, subsequent encounter: Secondary | ICD-10-CM | POA: Diagnosis not present

## 2020-03-20 DIAGNOSIS — J449 Chronic obstructive pulmonary disease, unspecified: Secondary | ICD-10-CM | POA: Diagnosis not present

## 2020-03-20 DIAGNOSIS — I5032 Chronic diastolic (congestive) heart failure: Secondary | ICD-10-CM | POA: Diagnosis not present

## 2020-03-20 DIAGNOSIS — R278 Other lack of coordination: Secondary | ICD-10-CM | POA: Diagnosis not present

## 2020-03-20 DIAGNOSIS — R2681 Unsteadiness on feet: Secondary | ICD-10-CM | POA: Diagnosis not present

## 2020-03-20 DIAGNOSIS — Z741 Need for assistance with personal care: Secondary | ICD-10-CM | POA: Diagnosis not present

## 2020-03-21 DIAGNOSIS — R2681 Unsteadiness on feet: Secondary | ICD-10-CM | POA: Diagnosis not present

## 2020-03-21 DIAGNOSIS — M6281 Muscle weakness (generalized): Secondary | ICD-10-CM | POA: Diagnosis not present

## 2020-03-21 DIAGNOSIS — R278 Other lack of coordination: Secondary | ICD-10-CM | POA: Diagnosis not present

## 2020-03-21 DIAGNOSIS — M2681 Anterior soft tissue impingement: Secondary | ICD-10-CM | POA: Diagnosis not present

## 2020-03-21 DIAGNOSIS — Z48811 Encounter for surgical aftercare following surgery on the nervous system: Secondary | ICD-10-CM | POA: Diagnosis not present

## 2020-03-21 DIAGNOSIS — I5032 Chronic diastolic (congestive) heart failure: Secondary | ICD-10-CM | POA: Diagnosis not present

## 2020-03-21 DIAGNOSIS — F39 Unspecified mood [affective] disorder: Secondary | ICD-10-CM | POA: Diagnosis not present

## 2020-03-21 DIAGNOSIS — F4323 Adjustment disorder with mixed anxiety and depressed mood: Secondary | ICD-10-CM | POA: Diagnosis not present

## 2020-03-21 DIAGNOSIS — G4700 Insomnia, unspecified: Secondary | ICD-10-CM | POA: Diagnosis not present

## 2020-03-21 DIAGNOSIS — W19XXXD Unspecified fall, subsequent encounter: Secondary | ICD-10-CM | POA: Diagnosis not present

## 2020-03-21 DIAGNOSIS — Z741 Need for assistance with personal care: Secondary | ICD-10-CM | POA: Diagnosis not present

## 2020-03-21 DIAGNOSIS — J449 Chronic obstructive pulmonary disease, unspecified: Secondary | ICD-10-CM | POA: Diagnosis not present

## 2020-03-22 DIAGNOSIS — R262 Difficulty in walking, not elsewhere classified: Secondary | ICD-10-CM | POA: Diagnosis not present

## 2020-03-22 DIAGNOSIS — R52 Pain, unspecified: Secondary | ICD-10-CM | POA: Diagnosis not present

## 2020-03-22 DIAGNOSIS — I5032 Chronic diastolic (congestive) heart failure: Secondary | ICD-10-CM | POA: Diagnosis not present

## 2020-03-22 DIAGNOSIS — W19XXXD Unspecified fall, subsequent encounter: Secondary | ICD-10-CM | POA: Diagnosis not present

## 2020-03-22 DIAGNOSIS — Z48811 Encounter for surgical aftercare following surgery on the nervous system: Secondary | ICD-10-CM | POA: Diagnosis not present

## 2020-03-22 DIAGNOSIS — R2681 Unsteadiness on feet: Secondary | ICD-10-CM | POA: Diagnosis not present

## 2020-03-22 DIAGNOSIS — J9610 Chronic respiratory failure, unspecified whether with hypoxia or hypercapnia: Secondary | ICD-10-CM | POA: Diagnosis not present

## 2020-03-22 DIAGNOSIS — R278 Other lack of coordination: Secondary | ICD-10-CM | POA: Diagnosis not present

## 2020-03-22 DIAGNOSIS — M2681 Anterior soft tissue impingement: Secondary | ICD-10-CM | POA: Diagnosis not present

## 2020-03-22 DIAGNOSIS — M6281 Muscle weakness (generalized): Secondary | ICD-10-CM | POA: Diagnosis not present

## 2020-03-22 DIAGNOSIS — J449 Chronic obstructive pulmonary disease, unspecified: Secondary | ICD-10-CM | POA: Diagnosis not present

## 2020-03-22 DIAGNOSIS — I10 Essential (primary) hypertension: Secondary | ICD-10-CM | POA: Diagnosis not present

## 2020-03-22 DIAGNOSIS — E782 Mixed hyperlipidemia: Secondary | ICD-10-CM | POA: Diagnosis not present

## 2020-03-22 DIAGNOSIS — Z741 Need for assistance with personal care: Secondary | ICD-10-CM | POA: Diagnosis not present

## 2020-03-23 DIAGNOSIS — Z741 Need for assistance with personal care: Secondary | ICD-10-CM | POA: Diagnosis not present

## 2020-03-23 DIAGNOSIS — J449 Chronic obstructive pulmonary disease, unspecified: Secondary | ICD-10-CM | POA: Diagnosis not present

## 2020-03-23 DIAGNOSIS — Z48811 Encounter for surgical aftercare following surgery on the nervous system: Secondary | ICD-10-CM | POA: Diagnosis not present

## 2020-03-23 DIAGNOSIS — I5032 Chronic diastolic (congestive) heart failure: Secondary | ICD-10-CM | POA: Diagnosis not present

## 2020-03-23 DIAGNOSIS — W19XXXD Unspecified fall, subsequent encounter: Secondary | ICD-10-CM | POA: Diagnosis not present

## 2020-03-23 DIAGNOSIS — M6281 Muscle weakness (generalized): Secondary | ICD-10-CM | POA: Diagnosis not present

## 2020-03-23 DIAGNOSIS — R2681 Unsteadiness on feet: Secondary | ICD-10-CM | POA: Diagnosis not present

## 2020-03-23 DIAGNOSIS — M2681 Anterior soft tissue impingement: Secondary | ICD-10-CM | POA: Diagnosis not present

## 2020-03-23 DIAGNOSIS — R278 Other lack of coordination: Secondary | ICD-10-CM | POA: Diagnosis not present

## 2020-03-24 DIAGNOSIS — M6281 Muscle weakness (generalized): Secondary | ICD-10-CM | POA: Diagnosis not present

## 2020-03-24 DIAGNOSIS — R278 Other lack of coordination: Secondary | ICD-10-CM | POA: Diagnosis not present

## 2020-03-24 DIAGNOSIS — Z48811 Encounter for surgical aftercare following surgery on the nervous system: Secondary | ICD-10-CM | POA: Diagnosis not present

## 2020-03-24 DIAGNOSIS — J449 Chronic obstructive pulmonary disease, unspecified: Secondary | ICD-10-CM | POA: Diagnosis not present

## 2020-03-24 DIAGNOSIS — Z741 Need for assistance with personal care: Secondary | ICD-10-CM | POA: Diagnosis not present

## 2020-03-24 DIAGNOSIS — M2681 Anterior soft tissue impingement: Secondary | ICD-10-CM | POA: Diagnosis not present

## 2020-03-24 DIAGNOSIS — R2681 Unsteadiness on feet: Secondary | ICD-10-CM | POA: Diagnosis not present

## 2020-03-24 DIAGNOSIS — I5032 Chronic diastolic (congestive) heart failure: Secondary | ICD-10-CM | POA: Diagnosis not present

## 2020-03-24 DIAGNOSIS — W19XXXD Unspecified fall, subsequent encounter: Secondary | ICD-10-CM | POA: Diagnosis not present

## 2020-03-25 DIAGNOSIS — M1000 Idiopathic gout, unspecified site: Secondary | ICD-10-CM | POA: Diagnosis not present

## 2020-03-25 DIAGNOSIS — M7670 Peroneal tendinitis, unspecified leg: Secondary | ICD-10-CM | POA: Diagnosis not present

## 2020-03-25 DIAGNOSIS — D649 Anemia, unspecified: Secondary | ICD-10-CM | POA: Diagnosis not present

## 2020-03-26 DIAGNOSIS — R2681 Unsteadiness on feet: Secondary | ICD-10-CM | POA: Diagnosis not present

## 2020-03-26 DIAGNOSIS — W19XXXD Unspecified fall, subsequent encounter: Secondary | ICD-10-CM | POA: Diagnosis not present

## 2020-03-26 DIAGNOSIS — D511 Vitamin B12 deficiency anemia due to selective vitamin B12 malabsorption with proteinuria: Secondary | ICD-10-CM | POA: Diagnosis not present

## 2020-03-26 DIAGNOSIS — J449 Chronic obstructive pulmonary disease, unspecified: Secondary | ICD-10-CM | POA: Diagnosis not present

## 2020-03-26 DIAGNOSIS — I5032 Chronic diastolic (congestive) heart failure: Secondary | ICD-10-CM | POA: Diagnosis not present

## 2020-03-26 DIAGNOSIS — M6281 Muscle weakness (generalized): Secondary | ICD-10-CM | POA: Diagnosis not present

## 2020-03-26 DIAGNOSIS — D538 Other specified nutritional anemias: Secondary | ICD-10-CM | POA: Diagnosis not present

## 2020-03-26 DIAGNOSIS — Z48811 Encounter for surgical aftercare following surgery on the nervous system: Secondary | ICD-10-CM | POA: Diagnosis not present

## 2020-03-26 DIAGNOSIS — M2681 Anterior soft tissue impingement: Secondary | ICD-10-CM | POA: Diagnosis not present

## 2020-03-26 DIAGNOSIS — R278 Other lack of coordination: Secondary | ICD-10-CM | POA: Diagnosis not present

## 2020-03-26 DIAGNOSIS — Z741 Need for assistance with personal care: Secondary | ICD-10-CM | POA: Diagnosis not present

## 2020-03-27 DIAGNOSIS — Z741 Need for assistance with personal care: Secondary | ICD-10-CM | POA: Diagnosis not present

## 2020-03-27 DIAGNOSIS — M6281 Muscle weakness (generalized): Secondary | ICD-10-CM | POA: Diagnosis not present

## 2020-03-27 DIAGNOSIS — M2681 Anterior soft tissue impingement: Secondary | ICD-10-CM | POA: Diagnosis not present

## 2020-03-27 DIAGNOSIS — J449 Chronic obstructive pulmonary disease, unspecified: Secondary | ICD-10-CM | POA: Diagnosis not present

## 2020-03-27 DIAGNOSIS — I5032 Chronic diastolic (congestive) heart failure: Secondary | ICD-10-CM | POA: Diagnosis not present

## 2020-03-27 DIAGNOSIS — W19XXXD Unspecified fall, subsequent encounter: Secondary | ICD-10-CM | POA: Diagnosis not present

## 2020-03-27 DIAGNOSIS — R278 Other lack of coordination: Secondary | ICD-10-CM | POA: Diagnosis not present

## 2020-03-27 DIAGNOSIS — Z48811 Encounter for surgical aftercare following surgery on the nervous system: Secondary | ICD-10-CM | POA: Diagnosis not present

## 2020-03-27 DIAGNOSIS — R2681 Unsteadiness on feet: Secondary | ICD-10-CM | POA: Diagnosis not present

## 2020-03-28 DIAGNOSIS — R2681 Unsteadiness on feet: Secondary | ICD-10-CM | POA: Diagnosis not present

## 2020-03-28 DIAGNOSIS — G4700 Insomnia, unspecified: Secondary | ICD-10-CM | POA: Diagnosis not present

## 2020-03-28 DIAGNOSIS — I5032 Chronic diastolic (congestive) heart failure: Secondary | ICD-10-CM | POA: Diagnosis not present

## 2020-03-28 DIAGNOSIS — Z741 Need for assistance with personal care: Secondary | ICD-10-CM | POA: Diagnosis not present

## 2020-03-28 DIAGNOSIS — J449 Chronic obstructive pulmonary disease, unspecified: Secondary | ICD-10-CM | POA: Diagnosis not present

## 2020-03-28 DIAGNOSIS — Z48811 Encounter for surgical aftercare following surgery on the nervous system: Secondary | ICD-10-CM | POA: Diagnosis not present

## 2020-03-28 DIAGNOSIS — F4323 Adjustment disorder with mixed anxiety and depressed mood: Secondary | ICD-10-CM | POA: Diagnosis not present

## 2020-03-28 DIAGNOSIS — F39 Unspecified mood [affective] disorder: Secondary | ICD-10-CM | POA: Diagnosis not present

## 2020-03-28 DIAGNOSIS — W19XXXD Unspecified fall, subsequent encounter: Secondary | ICD-10-CM | POA: Diagnosis not present

## 2020-03-28 DIAGNOSIS — R278 Other lack of coordination: Secondary | ICD-10-CM | POA: Diagnosis not present

## 2020-03-28 DIAGNOSIS — M6281 Muscle weakness (generalized): Secondary | ICD-10-CM | POA: Diagnosis not present

## 2020-03-28 DIAGNOSIS — M2681 Anterior soft tissue impingement: Secondary | ICD-10-CM | POA: Diagnosis not present

## 2020-03-29 DIAGNOSIS — R278 Other lack of coordination: Secondary | ICD-10-CM | POA: Diagnosis not present

## 2020-03-29 DIAGNOSIS — M6281 Muscle weakness (generalized): Secondary | ICD-10-CM | POA: Diagnosis not present

## 2020-03-29 DIAGNOSIS — J449 Chronic obstructive pulmonary disease, unspecified: Secondary | ICD-10-CM | POA: Diagnosis not present

## 2020-03-29 DIAGNOSIS — Z741 Need for assistance with personal care: Secondary | ICD-10-CM | POA: Diagnosis not present

## 2020-03-29 DIAGNOSIS — I5032 Chronic diastolic (congestive) heart failure: Secondary | ICD-10-CM | POA: Diagnosis not present

## 2020-03-29 DIAGNOSIS — M2681 Anterior soft tissue impingement: Secondary | ICD-10-CM | POA: Diagnosis not present

## 2020-03-29 DIAGNOSIS — Z48811 Encounter for surgical aftercare following surgery on the nervous system: Secondary | ICD-10-CM | POA: Diagnosis not present

## 2020-03-29 DIAGNOSIS — R2681 Unsteadiness on feet: Secondary | ICD-10-CM | POA: Diagnosis not present

## 2020-03-29 DIAGNOSIS — W19XXXD Unspecified fall, subsequent encounter: Secondary | ICD-10-CM | POA: Diagnosis not present

## 2020-03-30 DIAGNOSIS — I5032 Chronic diastolic (congestive) heart failure: Secondary | ICD-10-CM | POA: Diagnosis not present

## 2020-03-30 DIAGNOSIS — M2681 Anterior soft tissue impingement: Secondary | ICD-10-CM | POA: Diagnosis not present

## 2020-03-30 DIAGNOSIS — J449 Chronic obstructive pulmonary disease, unspecified: Secondary | ICD-10-CM | POA: Diagnosis not present

## 2020-03-30 DIAGNOSIS — R2681 Unsteadiness on feet: Secondary | ICD-10-CM | POA: Diagnosis not present

## 2020-03-30 DIAGNOSIS — Z741 Need for assistance with personal care: Secondary | ICD-10-CM | POA: Diagnosis not present

## 2020-03-30 DIAGNOSIS — M6281 Muscle weakness (generalized): Secondary | ICD-10-CM | POA: Diagnosis not present

## 2020-03-30 DIAGNOSIS — W19XXXD Unspecified fall, subsequent encounter: Secondary | ICD-10-CM | POA: Diagnosis not present

## 2020-03-30 DIAGNOSIS — R278 Other lack of coordination: Secondary | ICD-10-CM | POA: Diagnosis not present

## 2020-03-30 DIAGNOSIS — Z48811 Encounter for surgical aftercare following surgery on the nervous system: Secondary | ICD-10-CM | POA: Diagnosis not present

## 2020-03-31 DIAGNOSIS — E782 Mixed hyperlipidemia: Secondary | ICD-10-CM | POA: Diagnosis not present

## 2020-03-31 DIAGNOSIS — R52 Pain, unspecified: Secondary | ICD-10-CM | POA: Diagnosis not present

## 2020-03-31 DIAGNOSIS — R2681 Unsteadiness on feet: Secondary | ICD-10-CM | POA: Diagnosis not present

## 2020-03-31 DIAGNOSIS — W19XXXD Unspecified fall, subsequent encounter: Secondary | ICD-10-CM | POA: Diagnosis not present

## 2020-03-31 DIAGNOSIS — J9610 Chronic respiratory failure, unspecified whether with hypoxia or hypercapnia: Secondary | ICD-10-CM | POA: Diagnosis not present

## 2020-03-31 DIAGNOSIS — M2681 Anterior soft tissue impingement: Secondary | ICD-10-CM | POA: Diagnosis not present

## 2020-03-31 DIAGNOSIS — R278 Other lack of coordination: Secondary | ICD-10-CM | POA: Diagnosis not present

## 2020-03-31 DIAGNOSIS — Z48811 Encounter for surgical aftercare following surgery on the nervous system: Secondary | ICD-10-CM | POA: Diagnosis not present

## 2020-03-31 DIAGNOSIS — J449 Chronic obstructive pulmonary disease, unspecified: Secondary | ICD-10-CM | POA: Diagnosis not present

## 2020-03-31 DIAGNOSIS — I5032 Chronic diastolic (congestive) heart failure: Secondary | ICD-10-CM | POA: Diagnosis not present

## 2020-03-31 DIAGNOSIS — M6281 Muscle weakness (generalized): Secondary | ICD-10-CM | POA: Diagnosis not present

## 2020-03-31 DIAGNOSIS — Z741 Need for assistance with personal care: Secondary | ICD-10-CM | POA: Diagnosis not present

## 2020-03-31 DIAGNOSIS — R262 Difficulty in walking, not elsewhere classified: Secondary | ICD-10-CM | POA: Diagnosis not present

## 2020-03-31 DIAGNOSIS — I10 Essential (primary) hypertension: Secondary | ICD-10-CM | POA: Diagnosis not present

## 2020-04-01 DIAGNOSIS — W19XXXD Unspecified fall, subsequent encounter: Secondary | ICD-10-CM | POA: Diagnosis not present

## 2020-04-01 DIAGNOSIS — M6281 Muscle weakness (generalized): Secondary | ICD-10-CM | POA: Diagnosis not present

## 2020-04-01 DIAGNOSIS — H1820 Unspecified corneal edema: Secondary | ICD-10-CM | POA: Diagnosis not present

## 2020-04-01 DIAGNOSIS — I5032 Chronic diastolic (congestive) heart failure: Secondary | ICD-10-CM | POA: Diagnosis not present

## 2020-04-01 DIAGNOSIS — Z48811 Encounter for surgical aftercare following surgery on the nervous system: Secondary | ICD-10-CM | POA: Diagnosis not present

## 2020-04-01 DIAGNOSIS — J449 Chronic obstructive pulmonary disease, unspecified: Secondary | ICD-10-CM | POA: Diagnosis not present

## 2020-04-01 DIAGNOSIS — Z741 Need for assistance with personal care: Secondary | ICD-10-CM | POA: Diagnosis not present

## 2020-04-01 DIAGNOSIS — R2681 Unsteadiness on feet: Secondary | ICD-10-CM | POA: Diagnosis not present

## 2020-04-01 DIAGNOSIS — R278 Other lack of coordination: Secondary | ICD-10-CM | POA: Diagnosis not present

## 2020-04-01 DIAGNOSIS — M2681 Anterior soft tissue impingement: Secondary | ICD-10-CM | POA: Diagnosis not present

## 2020-04-03 DIAGNOSIS — Z48811 Encounter for surgical aftercare following surgery on the nervous system: Secondary | ICD-10-CM | POA: Diagnosis not present

## 2020-04-03 DIAGNOSIS — Z741 Need for assistance with personal care: Secondary | ICD-10-CM | POA: Diagnosis not present

## 2020-04-03 DIAGNOSIS — J449 Chronic obstructive pulmonary disease, unspecified: Secondary | ICD-10-CM | POA: Diagnosis not present

## 2020-04-03 DIAGNOSIS — R2681 Unsteadiness on feet: Secondary | ICD-10-CM | POA: Diagnosis not present

## 2020-04-03 DIAGNOSIS — I5032 Chronic diastolic (congestive) heart failure: Secondary | ICD-10-CM | POA: Diagnosis not present

## 2020-04-03 DIAGNOSIS — W19XXXD Unspecified fall, subsequent encounter: Secondary | ICD-10-CM | POA: Diagnosis not present

## 2020-04-03 DIAGNOSIS — M6281 Muscle weakness (generalized): Secondary | ICD-10-CM | POA: Diagnosis not present

## 2020-04-03 DIAGNOSIS — M2681 Anterior soft tissue impingement: Secondary | ICD-10-CM | POA: Diagnosis not present

## 2020-04-03 DIAGNOSIS — R278 Other lack of coordination: Secondary | ICD-10-CM | POA: Diagnosis not present

## 2020-04-04 DIAGNOSIS — J45909 Unspecified asthma, uncomplicated: Secondary | ICD-10-CM | POA: Diagnosis not present

## 2020-04-04 DIAGNOSIS — M2681 Anterior soft tissue impingement: Secondary | ICD-10-CM | POA: Diagnosis not present

## 2020-04-04 DIAGNOSIS — R0902 Hypoxemia: Secondary | ICD-10-CM | POA: Diagnosis not present

## 2020-04-04 DIAGNOSIS — R2681 Unsteadiness on feet: Secondary | ICD-10-CM | POA: Diagnosis not present

## 2020-04-04 DIAGNOSIS — M6281 Muscle weakness (generalized): Secondary | ICD-10-CM | POA: Diagnosis not present

## 2020-04-04 DIAGNOSIS — Z48811 Encounter for surgical aftercare following surgery on the nervous system: Secondary | ICD-10-CM | POA: Diagnosis not present

## 2020-04-04 DIAGNOSIS — W19XXXD Unspecified fall, subsequent encounter: Secondary | ICD-10-CM | POA: Diagnosis not present

## 2020-04-04 DIAGNOSIS — F39 Unspecified mood [affective] disorder: Secondary | ICD-10-CM | POA: Diagnosis not present

## 2020-04-04 DIAGNOSIS — R278 Other lack of coordination: Secondary | ICD-10-CM | POA: Diagnosis not present

## 2020-04-04 DIAGNOSIS — G4700 Insomnia, unspecified: Secondary | ICD-10-CM | POA: Diagnosis not present

## 2020-04-04 DIAGNOSIS — F4323 Adjustment disorder with mixed anxiety and depressed mood: Secondary | ICD-10-CM | POA: Diagnosis not present

## 2020-04-04 DIAGNOSIS — I5032 Chronic diastolic (congestive) heart failure: Secondary | ICD-10-CM | POA: Diagnosis not present

## 2020-04-04 DIAGNOSIS — Z741 Need for assistance with personal care: Secondary | ICD-10-CM | POA: Diagnosis not present

## 2020-04-04 DIAGNOSIS — J9601 Acute respiratory failure with hypoxia: Secondary | ICD-10-CM | POA: Diagnosis not present

## 2020-04-04 DIAGNOSIS — J449 Chronic obstructive pulmonary disease, unspecified: Secondary | ICD-10-CM | POA: Diagnosis not present

## 2020-04-05 DIAGNOSIS — R262 Difficulty in walking, not elsewhere classified: Secondary | ICD-10-CM | POA: Diagnosis not present

## 2020-04-05 DIAGNOSIS — W19XXXD Unspecified fall, subsequent encounter: Secondary | ICD-10-CM | POA: Diagnosis not present

## 2020-04-05 DIAGNOSIS — I5032 Chronic diastolic (congestive) heart failure: Secondary | ICD-10-CM | POA: Diagnosis not present

## 2020-04-05 DIAGNOSIS — R278 Other lack of coordination: Secondary | ICD-10-CM | POA: Diagnosis not present

## 2020-04-05 DIAGNOSIS — R2681 Unsteadiness on feet: Secondary | ICD-10-CM | POA: Diagnosis not present

## 2020-04-05 DIAGNOSIS — R52 Pain, unspecified: Secondary | ICD-10-CM | POA: Diagnosis not present

## 2020-04-05 DIAGNOSIS — J9610 Chronic respiratory failure, unspecified whether with hypoxia or hypercapnia: Secondary | ICD-10-CM | POA: Diagnosis not present

## 2020-04-05 DIAGNOSIS — J449 Chronic obstructive pulmonary disease, unspecified: Secondary | ICD-10-CM | POA: Diagnosis not present

## 2020-04-05 DIAGNOSIS — I10 Essential (primary) hypertension: Secondary | ICD-10-CM | POA: Diagnosis not present

## 2020-04-05 DIAGNOSIS — M6281 Muscle weakness (generalized): Secondary | ICD-10-CM | POA: Diagnosis not present

## 2020-04-05 DIAGNOSIS — M2681 Anterior soft tissue impingement: Secondary | ICD-10-CM | POA: Diagnosis not present

## 2020-04-05 DIAGNOSIS — Z48811 Encounter for surgical aftercare following surgery on the nervous system: Secondary | ICD-10-CM | POA: Diagnosis not present

## 2020-04-05 DIAGNOSIS — E782 Mixed hyperlipidemia: Secondary | ICD-10-CM | POA: Diagnosis not present

## 2020-04-05 DIAGNOSIS — Z741 Need for assistance with personal care: Secondary | ICD-10-CM | POA: Diagnosis not present

## 2020-04-06 DIAGNOSIS — R2681 Unsteadiness on feet: Secondary | ICD-10-CM | POA: Diagnosis not present

## 2020-04-06 DIAGNOSIS — J449 Chronic obstructive pulmonary disease, unspecified: Secondary | ICD-10-CM | POA: Diagnosis not present

## 2020-04-06 DIAGNOSIS — R278 Other lack of coordination: Secondary | ICD-10-CM | POA: Diagnosis not present

## 2020-04-06 DIAGNOSIS — Z48811 Encounter for surgical aftercare following surgery on the nervous system: Secondary | ICD-10-CM | POA: Diagnosis not present

## 2020-04-06 DIAGNOSIS — Z741 Need for assistance with personal care: Secondary | ICD-10-CM | POA: Diagnosis not present

## 2020-04-06 DIAGNOSIS — M2681 Anterior soft tissue impingement: Secondary | ICD-10-CM | POA: Diagnosis not present

## 2020-04-06 DIAGNOSIS — W19XXXD Unspecified fall, subsequent encounter: Secondary | ICD-10-CM | POA: Diagnosis not present

## 2020-04-06 DIAGNOSIS — I5032 Chronic diastolic (congestive) heart failure: Secondary | ICD-10-CM | POA: Diagnosis not present

## 2020-04-06 DIAGNOSIS — M6281 Muscle weakness (generalized): Secondary | ICD-10-CM | POA: Diagnosis not present

## 2020-04-08 DIAGNOSIS — I5032 Chronic diastolic (congestive) heart failure: Secondary | ICD-10-CM | POA: Diagnosis not present

## 2020-04-08 DIAGNOSIS — R2681 Unsteadiness on feet: Secondary | ICD-10-CM | POA: Diagnosis not present

## 2020-04-08 DIAGNOSIS — M2681 Anterior soft tissue impingement: Secondary | ICD-10-CM | POA: Diagnosis not present

## 2020-04-08 DIAGNOSIS — R278 Other lack of coordination: Secondary | ICD-10-CM | POA: Diagnosis not present

## 2020-04-08 DIAGNOSIS — W19XXXD Unspecified fall, subsequent encounter: Secondary | ICD-10-CM | POA: Diagnosis not present

## 2020-04-08 DIAGNOSIS — Z741 Need for assistance with personal care: Secondary | ICD-10-CM | POA: Diagnosis not present

## 2020-04-08 DIAGNOSIS — J449 Chronic obstructive pulmonary disease, unspecified: Secondary | ICD-10-CM | POA: Diagnosis not present

## 2020-04-08 DIAGNOSIS — Z48811 Encounter for surgical aftercare following surgery on the nervous system: Secondary | ICD-10-CM | POA: Diagnosis not present

## 2020-04-08 DIAGNOSIS — M6281 Muscle weakness (generalized): Secondary | ICD-10-CM | POA: Diagnosis not present

## 2020-04-11 DIAGNOSIS — F39 Unspecified mood [affective] disorder: Secondary | ICD-10-CM | POA: Diagnosis not present

## 2020-04-11 DIAGNOSIS — R278 Other lack of coordination: Secondary | ICD-10-CM | POA: Diagnosis not present

## 2020-04-11 DIAGNOSIS — M2681 Anterior soft tissue impingement: Secondary | ICD-10-CM | POA: Diagnosis not present

## 2020-04-11 DIAGNOSIS — I5032 Chronic diastolic (congestive) heart failure: Secondary | ICD-10-CM | POA: Diagnosis not present

## 2020-04-11 DIAGNOSIS — W19XXXD Unspecified fall, subsequent encounter: Secondary | ICD-10-CM | POA: Diagnosis not present

## 2020-04-11 DIAGNOSIS — G4700 Insomnia, unspecified: Secondary | ICD-10-CM | POA: Diagnosis not present

## 2020-04-11 DIAGNOSIS — M6281 Muscle weakness (generalized): Secondary | ICD-10-CM | POA: Diagnosis not present

## 2020-04-11 DIAGNOSIS — R2681 Unsteadiness on feet: Secondary | ICD-10-CM | POA: Diagnosis not present

## 2020-04-11 DIAGNOSIS — Z48811 Encounter for surgical aftercare following surgery on the nervous system: Secondary | ICD-10-CM | POA: Diagnosis not present

## 2020-04-11 DIAGNOSIS — F4323 Adjustment disorder with mixed anxiety and depressed mood: Secondary | ICD-10-CM | POA: Diagnosis not present

## 2020-04-11 DIAGNOSIS — Z741 Need for assistance with personal care: Secondary | ICD-10-CM | POA: Diagnosis not present

## 2020-04-11 DIAGNOSIS — J449 Chronic obstructive pulmonary disease, unspecified: Secondary | ICD-10-CM | POA: Diagnosis not present

## 2020-04-12 DIAGNOSIS — R2681 Unsteadiness on feet: Secondary | ICD-10-CM | POA: Diagnosis not present

## 2020-04-12 DIAGNOSIS — E7800 Pure hypercholesterolemia, unspecified: Secondary | ICD-10-CM | POA: Diagnosis not present

## 2020-04-12 DIAGNOSIS — Z9981 Dependence on supplemental oxygen: Secondary | ICD-10-CM | POA: Diagnosis not present

## 2020-04-12 DIAGNOSIS — E782 Mixed hyperlipidemia: Secondary | ICD-10-CM | POA: Diagnosis not present

## 2020-04-12 DIAGNOSIS — J9610 Chronic respiratory failure, unspecified whether with hypoxia or hypercapnia: Secondary | ICD-10-CM | POA: Diagnosis not present

## 2020-04-12 DIAGNOSIS — J449 Chronic obstructive pulmonary disease, unspecified: Secondary | ICD-10-CM | POA: Diagnosis not present

## 2020-04-12 DIAGNOSIS — R262 Difficulty in walking, not elsewhere classified: Secondary | ICD-10-CM | POA: Diagnosis not present

## 2020-04-12 DIAGNOSIS — I10 Essential (primary) hypertension: Secondary | ICD-10-CM | POA: Diagnosis not present

## 2020-04-12 DIAGNOSIS — M6281 Muscle weakness (generalized): Secondary | ICD-10-CM | POA: Diagnosis not present

## 2020-04-12 DIAGNOSIS — I5032 Chronic diastolic (congestive) heart failure: Secondary | ICD-10-CM | POA: Diagnosis not present

## 2020-04-12 DIAGNOSIS — F339 Major depressive disorder, recurrent, unspecified: Secondary | ICD-10-CM | POA: Diagnosis not present

## 2020-04-12 DIAGNOSIS — R52 Pain, unspecified: Secondary | ICD-10-CM | POA: Diagnosis not present

## 2020-04-13 DIAGNOSIS — M2681 Anterior soft tissue impingement: Secondary | ICD-10-CM | POA: Diagnosis not present

## 2020-04-13 DIAGNOSIS — J449 Chronic obstructive pulmonary disease, unspecified: Secondary | ICD-10-CM | POA: Diagnosis not present

## 2020-04-13 DIAGNOSIS — R278 Other lack of coordination: Secondary | ICD-10-CM | POA: Diagnosis not present

## 2020-04-13 DIAGNOSIS — Z48811 Encounter for surgical aftercare following surgery on the nervous system: Secondary | ICD-10-CM | POA: Diagnosis not present

## 2020-04-13 DIAGNOSIS — R2681 Unsteadiness on feet: Secondary | ICD-10-CM | POA: Diagnosis not present

## 2020-04-13 DIAGNOSIS — W19XXXD Unspecified fall, subsequent encounter: Secondary | ICD-10-CM | POA: Diagnosis not present

## 2020-04-13 DIAGNOSIS — Z741 Need for assistance with personal care: Secondary | ICD-10-CM | POA: Diagnosis not present

## 2020-04-13 DIAGNOSIS — I5032 Chronic diastolic (congestive) heart failure: Secondary | ICD-10-CM | POA: Diagnosis not present

## 2020-04-13 DIAGNOSIS — M6281 Muscle weakness (generalized): Secondary | ICD-10-CM | POA: Diagnosis not present

## 2020-04-17 DIAGNOSIS — Z48811 Encounter for surgical aftercare following surgery on the nervous system: Secondary | ICD-10-CM | POA: Diagnosis not present

## 2020-04-17 DIAGNOSIS — Z741 Need for assistance with personal care: Secondary | ICD-10-CM | POA: Diagnosis not present

## 2020-04-17 DIAGNOSIS — R278 Other lack of coordination: Secondary | ICD-10-CM | POA: Diagnosis not present

## 2020-04-17 DIAGNOSIS — M6281 Muscle weakness (generalized): Secondary | ICD-10-CM | POA: Diagnosis not present

## 2020-04-17 DIAGNOSIS — R2681 Unsteadiness on feet: Secondary | ICD-10-CM | POA: Diagnosis not present

## 2020-04-17 DIAGNOSIS — W19XXXD Unspecified fall, subsequent encounter: Secondary | ICD-10-CM | POA: Diagnosis not present

## 2020-04-17 DIAGNOSIS — M2681 Anterior soft tissue impingement: Secondary | ICD-10-CM | POA: Diagnosis not present

## 2020-04-17 DIAGNOSIS — I5032 Chronic diastolic (congestive) heart failure: Secondary | ICD-10-CM | POA: Diagnosis not present

## 2020-04-17 DIAGNOSIS — J449 Chronic obstructive pulmonary disease, unspecified: Secondary | ICD-10-CM | POA: Diagnosis not present

## 2020-04-18 DIAGNOSIS — F39 Unspecified mood [affective] disorder: Secondary | ICD-10-CM | POA: Diagnosis not present

## 2020-04-18 DIAGNOSIS — F4323 Adjustment disorder with mixed anxiety and depressed mood: Secondary | ICD-10-CM | POA: Diagnosis not present

## 2020-04-18 DIAGNOSIS — G4700 Insomnia, unspecified: Secondary | ICD-10-CM | POA: Diagnosis not present

## 2020-04-19 ENCOUNTER — Emergency Department (HOSPITAL_COMMUNITY): Payer: Medicare HMO

## 2020-04-19 ENCOUNTER — Encounter (HOSPITAL_COMMUNITY): Payer: Self-pay

## 2020-04-19 ENCOUNTER — Emergency Department (HOSPITAL_COMMUNITY)
Admission: EM | Admit: 2020-04-19 | Discharge: 2020-04-22 | Disposition: A | Discharge status: Home / Self Care | Payer: Medicare HMO | Attending: Emergency Medicine | Admitting: Emergency Medicine

## 2020-04-19 DIAGNOSIS — R0602 Shortness of breath: Secondary | ICD-10-CM

## 2020-04-19 DIAGNOSIS — J90 Pleural effusion, not elsewhere classified: Secondary | ICD-10-CM | POA: Diagnosis not present

## 2020-04-19 DIAGNOSIS — Z79899 Other long term (current) drug therapy: Secondary | ICD-10-CM | POA: Insufficient documentation

## 2020-04-19 DIAGNOSIS — E876 Hypokalemia: Secondary | ICD-10-CM | POA: Diagnosis not present

## 2020-04-19 DIAGNOSIS — S2231XA Fracture of one rib, right side, initial encounter for closed fracture: Secondary | ICD-10-CM | POA: Diagnosis not present

## 2020-04-19 DIAGNOSIS — R52 Pain, unspecified: Secondary | ICD-10-CM | POA: Diagnosis not present

## 2020-04-19 DIAGNOSIS — R079 Chest pain, unspecified: Secondary | ICD-10-CM | POA: Insufficient documentation

## 2020-04-19 DIAGNOSIS — I10 Essential (primary) hypertension: Secondary | ICD-10-CM | POA: Diagnosis not present

## 2020-04-19 DIAGNOSIS — J449 Chronic obstructive pulmonary disease, unspecified: Secondary | ICD-10-CM | POA: Diagnosis not present

## 2020-04-19 DIAGNOSIS — N183 Chronic kidney disease, stage 3 unspecified: Secondary | ICD-10-CM | POA: Diagnosis not present

## 2020-04-19 DIAGNOSIS — Z20822 Contact with and (suspected) exposure to covid-19: Secondary | ICD-10-CM | POA: Insufficient documentation

## 2020-04-19 DIAGNOSIS — D649 Anemia, unspecified: Secondary | ICD-10-CM | POA: Diagnosis not present

## 2020-04-19 DIAGNOSIS — E039 Hypothyroidism, unspecified: Secondary | ICD-10-CM | POA: Diagnosis not present

## 2020-04-19 DIAGNOSIS — R2681 Unsteadiness on feet: Secondary | ICD-10-CM | POA: Diagnosis not present

## 2020-04-19 DIAGNOSIS — I129 Hypertensive chronic kidney disease with stage 1 through stage 4 chronic kidney disease, or unspecified chronic kidney disease: Secondary | ICD-10-CM | POA: Diagnosis not present

## 2020-04-19 DIAGNOSIS — R262 Difficulty in walking, not elsewhere classified: Secondary | ICD-10-CM | POA: Diagnosis not present

## 2020-04-19 DIAGNOSIS — J9610 Chronic respiratory failure, unspecified whether with hypoxia or hypercapnia: Secondary | ICD-10-CM | POA: Diagnosis not present

## 2020-04-19 DIAGNOSIS — M6281 Muscle weakness (generalized): Secondary | ICD-10-CM | POA: Diagnosis not present

## 2020-04-19 DIAGNOSIS — E782 Mixed hyperlipidemia: Secondary | ICD-10-CM | POA: Diagnosis not present

## 2020-04-19 LAB — CBC WITH DIFFERENTIAL/PLATELET
Abs Immature Granulocytes: 0.12 10*3/uL — ABNORMAL HIGH (ref 0.00–0.07)
Basophils Absolute: 0 10*3/uL (ref 0.0–0.1)
Basophils Relative: 0 %
Eosinophils Absolute: 0 10*3/uL (ref 0.0–0.5)
Eosinophils Relative: 0 %
HCT: 33.5 % — ABNORMAL LOW (ref 36.0–46.0)
Hemoglobin: 10.3 g/dL — ABNORMAL LOW (ref 12.0–15.0)
Immature Granulocytes: 1 %
Lymphocytes Relative: 16 %
Lymphs Abs: 3.2 10*3/uL (ref 0.7–4.0)
MCH: 29.6 pg (ref 26.0–34.0)
MCHC: 30.7 g/dL (ref 30.0–36.0)
MCV: 96.3 fL (ref 80.0–100.0)
Monocytes Absolute: 1.5 10*3/uL — ABNORMAL HIGH (ref 0.1–1.0)
Monocytes Relative: 8 %
Neutro Abs: 14.4 10*3/uL — ABNORMAL HIGH (ref 1.7–7.7)
Neutrophils Relative %: 75 %
Platelets: 286 10*3/uL (ref 150–400)
RBC: 3.48 MIL/uL — ABNORMAL LOW (ref 3.87–5.11)
RDW: 13.7 % (ref 11.5–15.5)
WBC: 19.3 10*3/uL — ABNORMAL HIGH (ref 4.0–10.5)
nRBC: 0 % (ref 0.0–0.2)

## 2020-04-19 LAB — CBC
HCT: 36.4 % (ref 36.0–46.0)
Hemoglobin: 11.1 g/dL — ABNORMAL LOW (ref 12.0–15.0)
MCH: 29.7 pg (ref 26.0–34.0)
MCHC: 30.5 g/dL (ref 30.0–36.0)
MCV: 97.3 fL (ref 80.0–100.0)
Platelets: 285 10*3/uL (ref 150–400)
RBC: 3.74 MIL/uL — ABNORMAL LOW (ref 3.87–5.11)
RDW: 13.6 % (ref 11.5–15.5)
WBC: 19.8 10*3/uL — ABNORMAL HIGH (ref 4.0–10.5)
nRBC: 0 % (ref 0.0–0.2)

## 2020-04-19 LAB — COMPREHENSIVE METABOLIC PANEL
ALT: 10 U/L (ref 0–44)
AST: 16 U/L (ref 15–41)
Albumin: 3.3 g/dL — ABNORMAL LOW (ref 3.5–5.0)
Alkaline Phosphatase: 72 U/L (ref 38–126)
Anion gap: 11 (ref 5–15)
BUN: 17 mg/dL (ref 8–23)
CO2: 28 mmol/L (ref 22–32)
Calcium: 9.4 mg/dL (ref 8.9–10.3)
Chloride: 102 mmol/L (ref 98–111)
Creatinine, Ser: 0.99 mg/dL (ref 0.44–1.00)
GFR, Estimated: >60 mL/min (ref >60)
Glucose, Bld: 105 mg/dL — ABNORMAL HIGH (ref 70–99)
Potassium: 3.3 mmol/L — ABNORMAL LOW (ref 3.5–5.1)
Sodium: 141 mmol/L (ref 135–145)
Total Bilirubin: 0.7 mg/dL (ref 0.3–1.2)
Total Protein: 6 g/dL — ABNORMAL LOW (ref 6.5–8.1)

## 2020-04-19 LAB — BRAIN NATRIURETIC PEPTIDE: B Natriuretic Peptide: 55 pg/mL (ref 0.0–100.0)

## 2020-04-19 MED ORDER — POTASSIUM CHLORIDE CRYS ER 20 MEQ PO TBCR
40.0000 meq | EXTENDED_RELEASE_TABLET | Freq: Once | ORAL | Status: AC
  Administered 2020-04-20: 40 meq via ORAL
  Filled 2020-04-19: qty 2

## 2020-04-19 NOTE — ED Provider Notes (Signed)
Care assumed from Dr. Stevie Kern, patient from home was having difficulty with her oxygen concentrator.  Had chest pain in the ED, pending troponin x2.  Transition of care is planning to make sure she has all of her home appliances that she needs for discharge.  Plan on discharge unless troponins are positive.  Labs show mild hypokalemia and stable anemia.  Leukocytosis is present and is chronic.  Chest x-ray showed small pleural effusion and bronchial thickening but no infiltrates.  She is given oral potassium.  She will be discharged once transition of care team has ensured she has appropriate appliances at home.   Lindsey Booze, MD 04/20/20 [phone]

## 2020-04-19 NOTE — ED Triage Notes (Signed)
Assume care from EMS, ems reports pt was sent to [facility] from 07/21 and been wanted to leave facility over the year pt been complaining to the facility on wanting to leave pt was accepted into [facility] but pt refused to go there and wanted to go to [place]. Pt leaves [facility] AMA and sign her d/c paper work and took a cab home. Pt have an mechanical  falls inside her home on carpet floor neighbor heard pt screaming. Pt denies any head injury, pain and LOC with fall  110/68 HR 78 99 RA 3L CBG 141

## 2020-04-19 NOTE — ED Provider Notes (Signed)
[facility] EMERGENCY DEPARTMENT Provider Note   CSN: [id number] Arrival date & time: 04/19/20  1946     History Chief Complaint  Patient presents with  . Failure To Thrive    Lindsey Rowe is a 75 y.o. female.  Presents to ER from home.  Patient states that she was residing at [facility] for many months and wanted to leave the facility.  Does not want to stay at a SNF or any other sort of facility anymore.  She went home against our medical advice.  She states when she got home, there was some issue with her oxygen supply and she became suddenly short of breath.  She had some associated chest pain.  Pain has since resolved.  After receiving oxygen, she states her breathing improved.  She wears 2 L chronically for COPD.  She denies any cough or fever.   HPI     Past Medical History:  Diagnosis Date  . Arthritis   . Back pain   . Bipolar 1 disorder (HCC)   . Complication of anesthesia    hard to wake up anesthesia  . Depression   . GERD (gastroesophageal reflux disease)   . Headache(784.0)    migraines and tension headaches  . Hyperlipidemia   . Hypertension   . Hypothyroidism   . Thyroid disease     Patient Active Problem List   Diagnosis Date Noted  . Chronic kidney disease due to hypertension 01/22/2020  . Chronic kidney disease, stage 3 unspecified (HCC) 01/22/2020  . Fibromyalgia 01/22/2020  . Gout 01/22/2020  . Insomnia 01/22/2020  . 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 Osteoarthritis 01/22/2020  . Osteoporosis 01/22/2020  . Spinal stenosis 01/22/2020  . Thoracic aortic aneurysm without rupture (HCC) 01/22/2020  . Chronic pain of both knees 11/25/2019  . Subdural hematoma (HCC) 06/28/2019  . Kidney disease 05/14/2019  . Hyperlipidemia 04/29/2019  . COPD exacerbation (HCC) 10/08/2018  . Asthmatic bronchitis 12/02/2017  . Closed fracture of right talus 07/31/2017  . Acute respiratory failure  with hypoxia (HCC) 06/27/2017  . Diastolic dysfunction 06/27/2017  . Acute lower UTI 06/27/2017  . Acute metabolic encephalopathy 06/27/2017  . Altered mental state 06/10/2017  . Pain in lower limb 05/25/2017  . Dyspnea and respiratory abnormalities 08/29/2016  . Hypoxemia 08/29/2016  . Shoulder dislocation, right, initial encounter 05/24/2016  . 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 Hyperglycemia 05/24/2016  . Anterior dislocation of right shoulder   . Syncope 03/24/2016  . Generalized weakness 09/24/2015  . Leg pain, bilateral 09/24/2015  . Depression 09/24/2015  . Loss of appetite 09/24/2015  . Leg weakness, bilateral   . Hypothyroid 12/28/2012  . Hypovolemia 12/28/2012  . Hypotension 08/13/2012  . Dehydration 08/13/2012  . AKI (acute kidney injury) (HCC) 08/13/2012  . Hypoventilation associated with obesity syndrome (HCC) 08/13/2012  . Acute respiratory failure (HCC) 07/26/2012  . Chest pain 07/26/2012  . ANKLE SPRAIN, RIGHT 03/16/2010  . DIVERTICULOSIS, COLON 10/31/2009  . SKIN LESION 09/23/2009  . Shoulder pain, right 07/05/2009  . COUGH 03/29/2009  . Migraine 03/11/2009  . OTITIS MEDIA, RIGHT 12/13/2008  . BRUISE 12/06/2008  . ACID REFLUX DISEASE 09/13/2008  . ALLERGIC RHINITIS 05/20/2008  . UNSPECIFIED DISEASE OF HAIR AND HAIR FOLLICLES 05/20/2008  . DERMATITIS 11/12/2007  . OBESITY 07/18/2007  . HYPERCHOLESTEROLEMIA 05/13/2007  . BIPOLAR AFFECTIVE DISORDER 05/12/2007  . HYPERTENSION, BENIGN ESSENTIAL 05/12/2007  . Low back pain potentially associated with radiculopathy 05/12/2007  Past Surgical History:  Procedure Laterality Date  . APPENDECTOMY    . BACK SURGERY    . CRANIOTOMY Right 07/12/2019   Procedure: Guss Bunde for HEMATOMA EVACUATION SUBDURAL;  Surgeon: Jadene Pierini, MD;  Location: [facility] OR;  Service: Neurosurgery;  Laterality: Right;  . EYE SURGERY Bilateral    lens implant  . GASTRIC BYPASS    . LOOP RECORDER INSERTION N/A 05/25/2016    Procedure: Loop Recorder Insertion;  Surgeon: Will Jorja Loa, MD;  Location: [facility] INVASIVE CV LAB;  Service: Cardiovascular;  Laterality: N/A;  . LUMBAR LAMINECTOMY/DECOMPRESSION MICRODISCECTOMY Right 07/23/2012   Procedure: LUMBAR LAMINECTOMY/DECOMPRESSION MICRODISCECTOMY 2 LEVELS;  Surgeon: Karn Cassis, MD;  Location: [facility] NEURO ORS;  Service: Neurosurgery;  Laterality: Right;  Right Lumbar three-four  lumbar four-five Laminectomy/Foraminotomy  . NASAL SINUS SURGERY Bilateral   . TONSILLECTOMY    . WRIST SURGERY       OB History   No obstetric history on file.     Family History  Problem Relation Age of Onset  . Heart disease Mother   . Emphysema Father   . Diabetes Sister   . Other Brother        Brain tumor - s/p excision    Social History   Tobacco Use  . Smoking status: Never Smoker  . Smokeless tobacco: Never Used  Vaping Use  . Vaping Use: Never used  Substance Use Topics  . Alcohol use: Yes    Comment: social  . Drug use: No    Comment: Pt + opioids as prescribed    Home Medications Prior to Admission medications   Medication Sig Start Date End Date Taking? Authorizing Provider  acetaminophen (TYLENOL) 325 MG tablet Take 650 mg by mouth every 8 (eight) hours.    [provider]  albuterol (PROVENTIL HFA;VENTOLIN HFA) 108 (90 Base) MCG/ACT inhaler Inhale 2 puffs into the lungs every 6 (six) hours as needed for wheezing or shortness of breath. 04/07/18   Charlynne Pander, MD  busPIRone (BUSPAR) 10 MG tablet Take 10 mg by mouth 2 (two) times daily. 05/01/19   [provider]  cycloSPORINE (RESTASIS) 0.05 % ophthalmic emulsion Place 1 drop into both eyes 2 (two) times daily.    [provider]  DULoxetine (CYMBALTA) 30 MG capsule Take 1 capsule (30 mg total) by mouth 2 (two) times daily. 05/26/16   Pearson Grippe, MD  fluticasone (FLONASE) 50 MCG/ACT nasal spray Place 1 spray into both nostrils daily as needed for allergies or rhinitis.  06/29/17   Rai, Delene Ruffini, MD  levothyroxine (SYNTHROID, LEVOTHROID) 75 MCG tablet Take 75 mcg by mouth daily before breakfast.  09/06/15   [provider]  lisinopril (PRINIVIL,ZESTRIL) 10 MG tablet Take 10 mg by mouth daily.  08/15/16   [provider]  loperamide (IMODIUM) 2 MG capsule Take 1 capsule (2 mg total) by mouth 3 (three) times daily as needed for diarrhea or loose stools. 07/02/17   Rai, Ripudeep K, MD  LORazepam (ATIVAN) 0.5 MG tablet Take 0.5 mg by mouth 2 (two) times daily as needed for anxiety.    [provider]  meloxicam (MOBIC) 15 MG tablet 1 tablet    [provider]  Menthol, Topical Analgesic, (BIOFREEZE) 4 % GEL Apply 1 application topically 2 (two) times daily. Apply to both legs    [provider]  naproxen (NAPROSYN) 250 MG tablet  11/14/19   [provider]  omeprazole (PRILOSEC) 20 MG capsule Take 20  mg by mouth 2 (two) times daily before a meal.    [provider]  pravastatin (PRAVACHOL) 40 MG tablet Take 40 mg by mouth every evening.     [provider]  pregabalin (LYRICA) 100 MG capsule 1 capsule 03/17/19   [provider]  pregabalin (LYRICA) 150 MG capsule  11/05/19   [provider]  pregabalin (LYRICA) 75 MG capsule  01/13/20   [provider]  PROLIA 60 MG/ML SOSY injection Inject 60 mg into the skin See admin instructions. BRING TO THE OFFICE TO BE GIVEN SUBCUTANEOUSLY AS DIRECTED FOR 6 MONTHS 02/26/19   [provider]  SUMAtriptan (IMITREX) 100 MG tablet  11/23/19   [provider]  tiZANidine (ZANAFLEX) 2 MG tablet Take 2 mg by mouth 3 (three) times daily as needed for muscle spasms.  06/24/19   [provider]  vitamin B-12 (CYANOCOBALAMIN) 1000 MCG tablet Take 1,000 mcg by mouth daily.    [provider]  Vitamin D, Ergocalciferol, (DRISDOL) 1.25 MG (50000 UT) CAPS capsule Take 50,000 Units by mouth every Wednesday.  03/03/18    [provider]  ZOLMitriptan 2.5 MG SOLN  12/02/19   [provider]    Allergies    Gabapentin, Cefadroxil, and Oxycodone  Review of Systems   Review of Systems  Constitutional: Negative for chills and fever.  HENT: Negative for ear pain and sore throat.   Eyes: Negative for pain and visual disturbance.  Respiratory: Positive for shortness of breath. Negative for cough.   Cardiovascular: Positive for chest pain. Negative for palpitations.  Gastrointestinal: Negative for abdominal pain and vomiting.  Genitourinary: Negative for dysuria and hematuria.  Musculoskeletal: Negative for arthralgias and back pain.  Skin: Negative for color change and rash.  Neurological: Negative for seizures and syncope.  All other systems reviewed and are negative.   Physical Exam Updated Vital Signs BP 123/76 (BP Location: Left Arm)   Pulse 84   Temp 98.5 F (36.9 C) (Oral)   Resp 20   SpO2 100%   Physical Exam Vitals and nursing note reviewed.  Constitutional:      General: She is not in acute distress.    Appearance: She is well-developed.  HENT:     Head: Normocephalic and atraumatic.  Eyes:     Conjunctiva/sclera: Conjunctivae normal.  Cardiovascular:     Rate and Rhythm: Normal rate and regular rhythm.     Heart sounds: No murmur heard.   Pulmonary:     Effort: Pulmonary effort is normal. No respiratory distress.     Breath sounds: Normal breath sounds.  Abdominal:     Palpations: Abdomen is soft.     Tenderness: There is no abdominal tenderness.  Musculoskeletal:        General: No deformity or signs of injury.     Cervical back: Neck supple.  Skin:    General: Skin is warm and dry.  Neurological:     General: No focal deficit present.     Mental Status: She is alert.  Psychiatric:        Mood and Affect: Mood normal.        Behavior: Behavior normal.     ED Results / Procedures / Treatments   Labs (all labs ordered are listed, but only abnormal  results are displayed) Labs Reviewed  CBC - Abnormal; Notable for the following components:      Result Value   WBC 19.8 (*)    RBC 3.74 (*)  Hemoglobin 11.1 (*)    All other components within normal limits  COMPREHENSIVE METABOLIC PANEL - Abnormal; Notable for the following components:   Potassium 3.3 (*)    Glucose, Bld 105 (*)    Total Protein 6.0 (*)    Albumin 3.3 (*)    All other components within normal limits  CBC WITH DIFFERENTIAL/PLATELET - Abnormal; Notable for the following components:   WBC 19.3 (*)    RBC 3.48 (*)    Hemoglobin 10.3 (*)    HCT 33.5 (*)    Neutro Abs 14.4 (*)    Monocytes Absolute 1.5 (*)    Abs Immature Granulocytes 0.12 (*)    All other components within normal limits  RESP PANEL BY RT-PCR (FLU A&B, COVID) ARPGX2  BRAIN NATRIURETIC PEPTIDE  URINALYSIS, ROUTINE W REFLEX MICROSCOPIC  TROPONIN I (HIGH SENSITIVITY)  TROPONIN I (HIGH SENSITIVITY)    EKG None  Radiology No results found.  Procedures Procedures   Medications Ordered in ED Medications  potassium chloride SA (KLOR-CON) CR tablet 40 mEq (has no administration in time range)    ED Course  I have reviewed the triage vital signs and the nursing notes.  Pertinent labs & imaging results that were available during my care of the patient were reviewed by me and considered in my medical decision making (see chart for details).    MDM Rules/Calculators/A&P                         75 year old lady presents to ER with concern for shortness of breath after concern about home oxygen set up.  When I evaluated patient, she had no ongoing medical complaints.  She appeared well, lungs were clear.  Suspect her issue is related to her home oxygen supply.  Patient states that she strongly desires to go home.  Given the associated chest pain, will check EKG, basic labs, CXR.  While awaiting lab work, reassessment, signed out to Dr. Preston Fleeting.  Case management has evaluated and will work on making  necessary arrangements tomorrow for her home oxygen set up. If work up unremarkable, patient will need to board until her O2 is set up.  Final Clinical Impression(s) / ED Diagnoses Final diagnoses:  Shortness of breath    Rx / DC Orders ED Discharge Orders    None       Milagros Loll, MD 04/19/20 [id number]

## 2020-04-19 NOTE — Care Management (Signed)
ED RNCM was consulted from waiting room concerning patient who was brought in by EMS.  Patient signed AMA from [facility] after being there since 7/21. Met with patient at bedside and she states, she hated it and wanted to go home she lives alone and does not have any family.  But when she got back to her home she sustained a mechanical fall. Patient is also on oxygen and states the concentrator was not working well she has not been there for 8 months. Patient is also concerned with the condition of the house but plans to hire cleaning help.  Patient is wanting to return home with home health services. ED CM explained that we cannot call to have anyone go out check oxygen due to it being after hours. Discussed with EDP patient will remain in the ED until the am when [facility] can arrange services.

## 2020-04-20 DIAGNOSIS — R0602 Shortness of breath: Secondary | ICD-10-CM | POA: Diagnosis not present

## 2020-04-20 LAB — URINALYSIS, ROUTINE W REFLEX MICROSCOPIC
Bacteria, UA: NONE SEEN
Bilirubin Urine: NEGATIVE
Glucose, UA: NEGATIVE mg/dL
Hgb urine dipstick: NEGATIVE
Ketones, ur: NEGATIVE mg/dL
Nitrite: NEGATIVE
Protein, ur: NEGATIVE mg/dL
Specific Gravity, Urine: 1.02 (ref 1.005–1.030)
pH: 5 (ref 5.0–8.0)

## 2020-04-20 LAB — TROPONIN I (HIGH SENSITIVITY)
Troponin I (High Sensitivity): 11 ng/L (ref <18)
Troponin I (High Sensitivity): 13 ng/L (ref <18)

## 2020-04-20 LAB — RESP PANEL BY RT-PCR (FLU A&B, COVID) ARPGX2
Influenza A by PCR: NEGATIVE
Influenza B by PCR: NEGATIVE
SARS Coronavirus 2 by RT PCR: NEGATIVE

## 2020-04-20 MED ORDER — LORAZEPAM 1 MG PO TABS: 0.5000 mg | ORAL_TABLET | Freq: Two times a day (BID) | ORAL | Status: DC | PRN

## 2020-04-20 MED ORDER — BUSPIRONE HCL 10 MG PO TABS
10.0000 mg | ORAL_TABLET | Freq: Two times a day (BID) | ORAL | Status: DC
Start: 2020-04-20 — End: 2020-04-23
  Administered 2020-04-20 – 2020-04-22 (×6): 10 mg via ORAL
  Filled 2020-04-20 (×6): qty 1

## 2020-04-20 MED ORDER — PREGABALIN 100 MG PO CAPS
100.0000 mg | ORAL_CAPSULE | Freq: Two times a day (BID) | ORAL | Status: DC
  Administered 2020-04-20 – 2020-04-22 (×6): 100 mg via ORAL
  Filled 2020-04-20 (×6): qty 1

## 2020-04-20 MED ORDER — POTASSIUM CHLORIDE CRYS ER 20 MEQ PO TBCR
40.0000 meq | EXTENDED_RELEASE_TABLET | Freq: Once | ORAL | Status: AC
  Administered 2020-04-20: 40 meq via ORAL
  Filled 2020-04-20: qty 2

## 2020-04-20 MED ORDER — ACETAMINOPHEN 500 MG PO TABS
500.0000 mg | ORAL_TABLET | Freq: Four times a day (QID) | ORAL | Status: DC | PRN
  Administered 2020-04-21: 500 mg via ORAL
  Filled 2020-04-20: qty 1

## 2020-04-20 MED ORDER — DULOXETINE HCL 30 MG PO CPEP
30.0000 mg | ORAL_CAPSULE | Freq: Two times a day (BID) | ORAL | Status: DC
  Administered 2020-04-20 – 2020-04-22 (×5): 30 mg via ORAL
  Filled 2020-04-20 (×8): qty 1

## 2020-04-20 MED ORDER — PANTOPRAZOLE SODIUM 40 MG PO TBEC
40.0000 mg | DELAYED_RELEASE_TABLET | Freq: Every day | ORAL | Status: DC
  Administered 2020-04-20 – 2020-04-22 (×3): 40 mg via ORAL
  Filled 2020-04-20 (×3): qty 1

## 2020-04-20 MED ORDER — LEVOTHYROXINE SODIUM 75 MCG PO TABS
75.0000 ug | ORAL_TABLET | Freq: Every day | ORAL | Status: DC
  Administered 2020-04-20 – 2020-04-22 (×3): 75 ug via ORAL
  Filled 2020-04-20 (×3): qty 1

## 2020-04-20 MED ORDER — CYCLOSPORINE 0.05 % OP EMUL
1.0000 [drp] | Freq: Two times a day (BID) | OPHTHALMIC | Status: DC
  Administered 2020-04-20 – 2020-04-22 (×5): 1 [drp] via OPHTHALMIC
  Filled 2020-04-20 (×7): qty 1

## 2020-04-20 MED ORDER — ALBUTEROL SULFATE HFA 108 (90 BASE) MCG/ACT IN AERS: 2.0000 | INHALATION_SPRAY | Freq: Four times a day (QID) | RESPIRATORY_TRACT | Status: DC | PRN

## 2020-04-20 MED ORDER — PRAVASTATIN SODIUM 40 MG PO TABS
40.0000 mg | ORAL_TABLET | Freq: Every evening | ORAL | Status: DC
  Administered 2020-04-20 – 2020-04-21 (×3): 40 mg via ORAL
  Filled 2020-04-20 (×3): qty 1

## 2020-04-20 NOTE — [place] FL2 (Signed)
[place] MEDICAID FL2 LEVEL OF CARE SCREENING TOOL     IDENTIFICATION  Patient Name: Lindsey Rowe Birthdate: Feb 07, 1945 Sex: female Admission Date (Current Location): 04/19/2020  [facility] and [place] Number:  Haynes Bast [id number] L Facility and Address:  [facility], [address] [place], [place] [address]      Provider Number: [id number]  Attending Physician Name and Address:  Default, Provider, MD  Relative Name and Phone Number:  Murriel Hopper [phone]    Current Level of Care: Hospital Recommended Level of Care: Skilled Nursing Facility (Long-term care) Prior Approval Number:    Date Approved/Denied:   PASRR Number: [id number]  Discharge Plan: SNF (Long-term care)    Current Diagnoses: Patient Active Problem List   Diagnosis Date Noted  . Chronic kidney disease due to hypertension 01/22/2020  . Chronic kidney disease, stage 3 unspecified (HCC) 01/22/2020  . Fibromyalgia 01/22/2020  . Gout 01/22/2020  . Insomnia 01/22/2020  . 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 Osteoarthritis 01/22/2020  . Osteoporosis 01/22/2020  . Spinal stenosis 01/22/2020  . Thoracic aortic aneurysm without rupture (HCC) 01/22/2020  . Chronic pain of both knees 11/25/2019  . Subdural hematoma (HCC) 06/28/2019  . Kidney disease 05/14/2019  . Hyperlipidemia 04/29/2019  . COPD exacerbation (HCC) 10/08/2018  . Asthmatic bronchitis 12/02/2017  . Closed fracture of right talus 07/31/2017  . Acute respiratory failure with hypoxia (HCC) 06/27/2017  . Diastolic dysfunction 06/27/2017  . Acute lower UTI 06/27/2017  . Acute metabolic encephalopathy 06/27/2017  . Altered mental state 06/10/2017  . Pain in lower limb 05/25/2017  . Dyspnea and respiratory abnormalities 08/29/2016  . Hypoxemia 08/29/2016  . Shoulder dislocation, right, initial encounter 05/24/2016  . 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  Hyperglycemia 05/24/2016  . Anterior dislocation of right shoulder   . Syncope 03/24/2016  . Generalized weakness 09/24/2015  . Leg pain, bilateral 09/24/2015  . Depression 09/24/2015  . Loss of appetite 09/24/2015  . Leg weakness, bilateral   . Hypothyroid 12/28/2012  . Hypovolemia 12/28/2012  . Hypotension 08/13/2012  . Dehydration 08/13/2012  . AKI (acute kidney injury) (HCC) 08/13/2012  . Hypoventilation associated with obesity syndrome (HCC) 08/13/2012  . Acute respiratory failure (HCC) 07/26/2012  . Chest pain 07/26/2012  . ANKLE SPRAIN, RIGHT 03/16/2010  . DIVERTICULOSIS, COLON 10/31/2009  . SKIN LESION 09/23/2009  . Shoulder pain, right 07/05/2009  . COUGH 03/29/2009  . Migraine 03/11/2009  . OTITIS MEDIA, RIGHT 12/13/2008  . BRUISE 12/06/2008  . ACID REFLUX DISEASE 09/13/2008  . ALLERGIC RHINITIS 05/20/2008  . UNSPECIFIED DISEASE OF HAIR AND HAIR FOLLICLES 05/20/2008  . DERMATITIS 11/12/2007  . OBESITY 07/18/2007  . HYPERCHOLESTEROLEMIA 05/13/2007  . BIPOLAR AFFECTIVE DISORDER 05/12/2007  . HYPERTENSION, BENIGN ESSENTIAL 05/12/2007  . Low back pain potentially associated with radiculopathy 05/12/2007    Orientation RESPIRATION BLADDER Height & Weight     Self,Time,Situation,Place  O2 (2 liters) Continent Weight:   Height:     BEHAVIORAL SYMPTOMS/MOOD NEUROLOGICAL BOWEL NUTRITION STATUS      Continent Diet (regular)  AMBULATORY STATUS COMMUNICATION OF NEEDS Skin   Extensive Assist Verbally Normal                       Personal Care Assistance Level of Assistance  Bathing,Feeding,Dressing Bathing Assistance: Limited assistance Feeding assistance: Independent Dressing Assistance: Limited assistance     Functional Limitations Info  Speech,Hearing,Sight Sight Info: Adequate Hearing Info: Adequate Speech Info:  Adequate    SPECIAL CARE FACTORS FREQUENCY  PT (By licensed PT),OT (By licensed OT)     PT Frequency: 5x weekly OT Frequency: 5x weekly             Contractures      Additional Factors Info  Code Status,Allergies Code Status Info: DNR Allergies Info: (#)Gabapentin, Oxycodone, Cefadroxil           Current Medications (04/20/2020):  This is the current hospital active medication list Current Facility-Administered Medications  Medication Dose Route Frequency Provider Last Rate Last Admin  . acetaminophen (TYLENOL) tablet 500 mg  500 mg Oral Q6H PRN Milagros Loll, MD      . albuterol (VENTOLIN HFA) 108 (90 Base) MCG/ACT inhaler 2 puff  2 puff Inhalation Q6H PRN Milagros Loll, MD      . busPIRone (BUSPAR) tablet 10 mg  10 mg Oral BID Milagros Loll, MD   10 mg at 04/20/20 1123  . cycloSPORINE (RESTASIS) 0.05 % ophthalmic emulsion 1 drop  1 drop Both Eyes BID Milagros Loll, MD   1 drop at 04/20/20 1123  . DULoxetine (CYMBALTA) DR capsule 30 mg  30 mg Oral BID Milagros Loll, MD   30 mg at 04/20/20 1123  . levothyroxine (SYNTHROID) tablet 75 mcg  75 mcg Oral QAC breakfast Milagros Loll, MD   75 mcg at 04/20/20 1123  . LORazepam (ATIVAN) tablet 0.5 mg  0.5 mg Oral BID PRN Milagros Loll, MD      . pantoprazole (PROTONIX) EC tablet 40 mg  40 mg Oral Daily Milagros Loll, MD   40 mg at 04/20/20 1122  . pravastatin (PRAVACHOL) tablet 40 mg  40 mg Oral QPM Milagros Loll, MD   40 mg at 04/20/20 0159  . pregabalin (LYRICA) capsule 100 mg  100 mg Oral BID Milagros Loll, MD   100 mg at 04/20/20 1123   Current Outpatient Medications  Medication Sig Dispense Refill  . acetaminophen (TYLENOL) 325 MG tablet Take 650 mg by mouth every 8 (eight) hours.    [name] albuterol (PROVENTIL HFA;VENTOLIN HFA) 108 (90 Base) MCG/ACT inhaler Inhale 2 puffs into the lungs every 6 (six) hours as needed for wheezing or shortness of breath. 1 Inhaler 0  . busPIRone (BUSPAR) 10 MG tablet Take 10 mg by mouth 2 (two) times daily.    . cycloSPORINE (RESTASIS) 0.05 % ophthalmic emulsion Place 1 drop into both eyes 2  (two) times daily.    . DULoxetine (CYMBALTA) 30 MG capsule Take 1 capsule (30 mg total) by mouth 2 (two) times daily. 60 capsule 0  . fluticasone (FLONASE) 50 MCG/ACT nasal spray Place 1 spray into both nostrils daily as needed for allergies or rhinitis. 16 g 2  . levothyroxine (SYNTHROID, LEVOTHROID) 75 MCG tablet Take 75 mcg by mouth daily before breakfast.     . lisinopril (PRINIVIL,ZESTRIL) 10 MG tablet Take 10 mg by mouth daily.     [name] loperamide (IMODIUM) 2 MG capsule Take 1 capsule (2 mg total) by mouth 3 (three) times daily as needed for diarrhea or loose stools. 30 capsule 0  . LORazepam (ATIVAN) 0.5 MG tablet Take 0.5 mg by mouth 2 (two) times daily as needed for anxiety.    . meloxicam (MOBIC) 15 MG tablet 1 tablet    . Menthol, Topical Analgesic, (BIOFREEZE) 4 % GEL Apply 1 application topically 2 (two) times daily. Apply to both legs    . naproxen (  NAPROSYN) 250 MG tablet     . omeprazole (PRILOSEC) 20 MG capsule Take 20 mg by mouth 2 (two) times daily before a meal.    . pravastatin (PRAVACHOL) 40 MG tablet Take 40 mg by mouth every evening.     . pregabalin (LYRICA) 100 MG capsule 1 capsule    . pregabalin (LYRICA) 150 MG capsule     . pregabalin (LYRICA) 75 MG capsule     . PROLIA 60 MG/ML SOSY injection Inject 60 mg into the skin See admin instructions. BRING TO THE OFFICE TO BE GIVEN SUBCUTANEOUSLY AS DIRECTED FOR 6 MONTHS    . SUMAtriptan (IMITREX) 100 MG tablet     . tiZANidine (ZANAFLEX) 2 MG tablet Take 2 mg by mouth 3 (three) times daily as needed for muscle spasms.     . vitamin B-12 (CYANOCOBALAMIN) 1000 MCG tablet Take 1,000 mcg by mouth daily.    . Vitamin D, Ergocalciferol, (DRISDOL) 1.25 MG (50000 UT) CAPS capsule Take 50,000 Units by mouth every Wednesday.     [name] ZOLMitriptan 2.5 MG SOLN        Discharge Medications: Please see discharge summary for a list of discharge medications.  Relevant Imaging Results:  Relevant Lab Results:   Additional  Information SS# [id number]/ Pt has had 2 Covid shots plus booster  Lenix Kidd, LCSW

## 2020-04-20 NOTE — ED Notes (Signed)
PT at bedside.

## 2020-04-20 NOTE — ED Notes (Signed)
DSS SW at bedside.

## 2020-04-20 NOTE — Progress Notes (Signed)
Although DSS case worker is seeking placement at [facility], CSW also sent out [facility] and referrals to several SNFs who have long-term care and who accept Medicaid in order to make placement easier.

## 2020-04-20 NOTE — ED Notes (Signed)
SW at bedside.

## 2020-04-20 NOTE — ED Notes (Signed)
Report given to Brenda, RN

## 2020-04-20 NOTE — Evaluation (Signed)
Physical Therapy Evaluation Patient Details Name: Lindsey Rowe MRN: [id number] DOB: [birth year] Today's Date: 04/20/2020   History of Present Illness  Pt is a 75 y/o female presenting to the ED on 3/15 after becoming SOB as her oxygen tank was not functioning appropriately. Pt recently signed herself out of SNF. PMH includes HTN, bipolar disorder, and back surgery.  Clinical Impression  Pt admitted secondary to problem above with deficits below. Pt requiring min A for bed mobility and min guard A to take steps at EOB. Pt reporting increased fatigue and unable to perform further mobility. Pt was at home alone prior to presentation and had difficulty caring for herself. Recommending SNF level therapies at d/c. Pt eager to work with therapies. Will continue to follow acutely.     Follow Up Recommendations SNF    Equipment Recommendations  Wheelchair (measurements PT);Wheelchair cushion (measurements PT)    Recommendations for Other Services       Precautions / Restrictions Precautions Precautions: Fall Restrictions Weight Bearing Restrictions: No      Mobility  Bed Mobility Overal bed mobility: Needs Assistance Bed Mobility: Supine to Sit;Sit to Supine     Supine to sit: Min assist Sit to supine: Mod assist   General bed mobility comments: Min A for trunk elevation to come to sitting.    Transfers Overall transfer level: Needs assistance Equipment used: Rolling walker (2 wheeled) Transfers: Sit to/from Stand Sit to Stand: Min guard         General transfer comment: Min guard for safety. Pt stood X3 at EOB.  Ambulation/Gait Ambulation/Gait assistance: Min guard   Assistive device: Rolling walker (2 wheeled)   Gait velocity: Decreased   General Gait Details: took side steps at EOB. Pt reports she is very sleepy and wanting to go back to bed. Min guard to take steps at EOB. Per NT, pt ambulated in bathroom using RW earlier.  Stairs            Wheelchair  Mobility    Modified Rankin (Stroke Patients Only)       Balance Overall balance assessment: Needs assistance Sitting-balance support: No upper extremity supported;Feet supported Sitting balance-Leahy Scale: Fair     Standing balance support: Bilateral upper extremity supported;During functional activity Standing balance-Leahy Scale: Poor Standing balance comment: Reliant on BUE support                             Pertinent Vitals/Pain Pain Assessment: Faces Faces Pain Scale: Hurts a little bit Pain Location: back Pain Descriptors / Indicators: Aching Pain Intervention(s): Limited activity within patient's tolerance;Monitored during session;Repositioned    Home Living Family/patient expects to be discharged to:: Assisted living                 Additional Comments: Pt reports she was unable to take care of herself at home, but not wanting to go back to [facility] because "they didn't do anything for me".    Prior Function Level of Independence: Needs assistance   Gait / Transfers Assistance Needed: Per pt, she was able to ambulate short distances with RW. Did not get out of bed much at SNF.  ADL's / Homemaking Assistance Needed: Required assist for ADLs at SNF.        Hand Dominance        Extremity/Trunk Assessment   Upper Extremity Assessment Upper Extremity Assessment: Generalized weakness    Lower Extremity Assessment Lower Extremity Assessment:  Generalized weakness    Cervical / Trunk Assessment Cervical / Trunk Assessment: Normal  Communication   Communication: No difficulties  Cognition Arousal/Alertness: Awake/alert Behavior During Therapy: WFL for tasks assessed/performed Overall Cognitive Status: No family/caregiver present to determine baseline cognitive functioning                                        General Comments      Exercises     Assessment/Plan    PT Assessment Patient needs continued PT  services  PT Problem List Decreased strength;Decreased mobility;Decreased balance;Decreased activity tolerance;Decreased knowledge of use of DME;Decreased knowledge of precautions       PT Treatment Interventions DME instruction;Gait training;Therapeutic activities;Functional mobility training;Balance training;Therapeutic exercise;Patient/family education    PT Goals (Current goals can be found in the Care Plan section)  Acute Rehab PT Goals Patient Stated Goal: to get stronger and be independent PT Goal Formulation: With patient Time For Goal Achievement: 05/04/20 Potential to Achieve Goals: Good    Frequency Min 2X/week   Barriers to discharge Decreased caregiver support      Co-evaluation               AM-PAC PT "6 Clicks" Mobility  Outcome Measure Help needed turning from your back to your side while in a flat bed without using bedrails?: A Little Help needed moving from lying on your back to sitting on the side of a flat bed without using bedrails?: A Little Help needed moving to and from a bed to a chair (including a wheelchair)?: A Little Help needed standing up from a chair using your arms (e.g., wheelchair or bedside chair)?: A Little Help needed to walk in hospital room?: A Lot Help needed climbing 3-5 steps with a railing? : Total 6 Click Score: 15    End of Session Equipment Utilized During Treatment: Gait belt Activity Tolerance: Patient limited by fatigue Patient left: in bed;with call bell/phone within reach (on stretcher in ED) Nurse Communication: Mobility status PT Visit Diagnosis: Unsteadiness on feet (R26.81);Muscle weakness (generalized) (M62.81)    Time: 1443-1540 PT Time Calculation (min) (ACUTE ONLY): 17 min   Charges:   PT Evaluation $PT Eval Moderate Complexity: 1 Mod          Farley Ly, PT, DPT  Acute Rehabilitation Services  Pager: [phone] Office: [phone]   Lehman Prom 04/20/2020, 4:30 PM

## 2020-04-20 NOTE — ED Notes (Addendum)
Pt provided dinner tray, ate and was escorted to pt restroom and repositioned back to bed in stable condition.

## 2020-04-20 NOTE — Progress Notes (Signed)
CSW met with Pt at bedside Pt states that she does consent to going to SNF longterm. .  CSW spoke with DSS caseworker London Pepper who states that Pt had previous bed offer at [facility] of recent note. Ms. London Pepper called [facility] to confirm that bed offer was still viable but had not heard back as of this writing.  Pt does have longterm Medicaid. CSW will fill out new FL2 to ensure ease of placement.

## 2020-04-21 DIAGNOSIS — R0602 Shortness of breath: Secondary | ICD-10-CM | POA: Diagnosis not present

## 2020-04-21 NOTE — Progress Notes (Signed)
CSW contacted [facility] to verify if patient would be accepted. Kenney Houseman stated she would check with the business office and give CSW a call back.

## 2020-04-21 NOTE — Progress Notes (Signed)
CSW contacted APS Ignacia Bayley to give her an update on placement. Patient has a current APS case open.

## 2020-04-21 NOTE — Progress Notes (Signed)
CSW notified [facility] to let them know they would need to start the authorization.

## 2020-04-21 NOTE — Progress Notes (Signed)
CSW contacted [employer] to see if patient was being followed by [employer]. CSW was told that an eligibility ticket had to be placed to determine status. CSW was told someone would call and update on status.

## 2020-04-21 NOTE — Progress Notes (Signed)
CSW spoke with patient to let her know [facility] will possibly accept her for placement. CSW asked patient if she was vaccinated and she stated yes and that she got the moderna shot and booster. Patient did not have her card and stated she would need to find it.

## 2020-04-21 NOTE — ED Notes (Signed)
Pt transported to restroom by wheelchair and walker.

## 2020-04-22 DIAGNOSIS — M160 Bilateral primary osteoarthritis of hip: Secondary | ICD-10-CM | POA: Diagnosis not present

## 2020-04-22 DIAGNOSIS — W19XXXA Unspecified fall, initial encounter: Secondary | ICD-10-CM | POA: Diagnosis not present

## 2020-04-22 DIAGNOSIS — R102 Pelvic and perineal pain: Secondary | ICD-10-CM | POA: Diagnosis not present

## 2020-04-22 DIAGNOSIS — S199XXA Unspecified injury of neck, initial encounter: Secondary | ICD-10-CM | POA: Diagnosis not present

## 2020-04-22 DIAGNOSIS — K5730 Diverticulosis of large intestine without perforation or abscess without bleeding: Secondary | ICD-10-CM | POA: Diagnosis not present

## 2020-04-22 DIAGNOSIS — M6281 Muscle weakness (generalized): Secondary | ICD-10-CM | POA: Diagnosis not present

## 2020-04-22 DIAGNOSIS — J9601 Acute respiratory failure with hypoxia: Secondary | ICD-10-CM | POA: Diagnosis not present

## 2020-04-22 DIAGNOSIS — F419 Anxiety disorder, unspecified: Secondary | ICD-10-CM | POA: Diagnosis not present

## 2020-04-22 DIAGNOSIS — Z7951 Long term (current) use of inhaled steroids: Secondary | ICD-10-CM | POA: Diagnosis not present

## 2020-04-22 DIAGNOSIS — Z20822 Contact with and (suspected) exposure to covid-19: Secondary | ICD-10-CM | POA: Diagnosis not present

## 2020-04-22 DIAGNOSIS — G4701 Insomnia due to medical condition: Secondary | ICD-10-CM | POA: Diagnosis not present

## 2020-04-22 DIAGNOSIS — F064 Anxiety disorder due to known physiological condition: Secondary | ICD-10-CM | POA: Diagnosis not present

## 2020-04-22 DIAGNOSIS — Z79891 Long term (current) use of opiate analgesic: Secondary | ICD-10-CM | POA: Diagnosis not present

## 2020-04-22 DIAGNOSIS — D649 Anemia, unspecified: Secondary | ICD-10-CM | POA: Diagnosis not present

## 2020-04-22 DIAGNOSIS — F319 Bipolar disorder, unspecified: Secondary | ICD-10-CM | POA: Diagnosis not present

## 2020-04-22 DIAGNOSIS — G43909 Migraine, unspecified, not intractable, without status migrainosus: Secondary | ICD-10-CM | POA: Diagnosis not present

## 2020-04-22 DIAGNOSIS — Z79899 Other long term (current) drug therapy: Secondary | ICD-10-CM | POA: Diagnosis not present

## 2020-04-22 DIAGNOSIS — R079 Chest pain, unspecified: Secondary | ICD-10-CM | POA: Diagnosis not present

## 2020-04-22 DIAGNOSIS — Z1159 Encounter for screening for other viral diseases: Secondary | ICD-10-CM | POA: Diagnosis not present

## 2020-04-22 DIAGNOSIS — J441 Chronic obstructive pulmonary disease with (acute) exacerbation: Secondary | ICD-10-CM | POA: Diagnosis not present

## 2020-04-22 DIAGNOSIS — R0902 Hypoxemia: Secondary | ICD-10-CM | POA: Diagnosis not present

## 2020-04-22 DIAGNOSIS — I959 Hypotension, unspecified: Secondary | ICD-10-CM | POA: Diagnosis not present

## 2020-04-22 DIAGNOSIS — G4700 Insomnia, unspecified: Secondary | ICD-10-CM | POA: Diagnosis not present

## 2020-04-22 DIAGNOSIS — J45909 Unspecified asthma, uncomplicated: Secondary | ICD-10-CM | POA: Diagnosis not present

## 2020-04-22 DIAGNOSIS — N179 Acute kidney failure, unspecified: Secondary | ICD-10-CM | POA: Diagnosis not present

## 2020-04-22 DIAGNOSIS — R52 Pain, unspecified: Secondary | ICD-10-CM | POA: Diagnosis not present

## 2020-04-22 DIAGNOSIS — M533 Sacrococcygeal disorders, not elsewhere classified: Secondary | ICD-10-CM | POA: Diagnosis not present

## 2020-04-22 DIAGNOSIS — S0990XA Unspecified injury of head, initial encounter: Secondary | ICD-10-CM | POA: Diagnosis not present

## 2020-04-22 DIAGNOSIS — E662 Morbid (severe) obesity with alveolar hypoventilation: Secondary | ICD-10-CM | POA: Diagnosis not present

## 2020-04-22 DIAGNOSIS — R5381 Other malaise: Secondary | ICD-10-CM | POA: Diagnosis not present

## 2020-04-22 DIAGNOSIS — H1820 Unspecified corneal edema: Secondary | ICD-10-CM | POA: Diagnosis not present

## 2020-04-22 DIAGNOSIS — R296 Repeated falls: Secondary | ICD-10-CM | POA: Diagnosis not present

## 2020-04-22 DIAGNOSIS — M5030 Other cervical disc degeneration, unspecified cervical region: Secondary | ICD-10-CM | POA: Diagnosis not present

## 2020-04-22 DIAGNOSIS — K921 Melena: Secondary | ICD-10-CM | POA: Diagnosis not present

## 2020-04-22 DIAGNOSIS — H0510 Unspecified chronic inflammatory disorders of orbit: Secondary | ICD-10-CM | POA: Diagnosis not present

## 2020-04-22 DIAGNOSIS — R2681 Unsteadiness on feet: Secondary | ICD-10-CM | POA: Diagnosis not present

## 2020-04-22 DIAGNOSIS — E876 Hypokalemia: Secondary | ICD-10-CM | POA: Diagnosis not present

## 2020-04-22 DIAGNOSIS — M797 Fibromyalgia: Secondary | ICD-10-CM | POA: Diagnosis not present

## 2020-04-22 DIAGNOSIS — R0602 Shortness of breath: Secondary | ICD-10-CM | POA: Diagnosis not present

## 2020-04-22 DIAGNOSIS — M545 Low back pain, unspecified: Secondary | ICD-10-CM | POA: Diagnosis not present

## 2020-04-22 DIAGNOSIS — I129 Hypertensive chronic kidney disease with stage 1 through stage 4 chronic kidney disease, or unspecified chronic kidney disease: Secondary | ICD-10-CM | POA: Diagnosis not present

## 2020-04-22 DIAGNOSIS — N183 Chronic kidney disease, stage 3 unspecified: Secondary | ICD-10-CM | POA: Diagnosis not present

## 2020-04-22 DIAGNOSIS — E039 Hypothyroidism, unspecified: Secondary | ICD-10-CM | POA: Diagnosis not present

## 2020-04-22 DIAGNOSIS — M542 Cervicalgia: Secondary | ICD-10-CM | POA: Diagnosis not present

## 2020-04-22 DIAGNOSIS — J449 Chronic obstructive pulmonary disease, unspecified: Secondary | ICD-10-CM | POA: Diagnosis not present

## 2020-04-22 DIAGNOSIS — R9082 White matter disease, unspecified: Secondary | ICD-10-CM | POA: Diagnosis not present

## 2020-04-22 DIAGNOSIS — M47816 Spondylosis without myelopathy or radiculopathy, lumbar region: Secondary | ICD-10-CM | POA: Diagnosis not present

## 2020-04-22 DIAGNOSIS — Z743 Need for continuous supervision: Secondary | ICD-10-CM | POA: Diagnosis not present

## 2020-04-22 DIAGNOSIS — R279 Unspecified lack of coordination: Secondary | ICD-10-CM | POA: Diagnosis not present

## 2020-04-22 NOTE — Progress Notes (Signed)
Patient was accepted at [facility]. CSW will let patient know

## 2020-04-22 NOTE — ED Notes (Signed)
Pt moved into reclining chair for comfort.

## 2020-04-22 NOTE — Progress Notes (Signed)
[facility] plans to meet with patient today between 11:00 AM- 12:00 PM

## 2020-04-22 NOTE — Progress Notes (Signed)
Physical Therapy Treatment Patient Details Name: Lindsey Rowe MRN: [id number] DOB: [birth year] Today's Date: 04/22/2020    History of Present Illness Pt is a 75 y/o female presenting to the ED on 3/15 after becoming SOB as her oxygen tank was not functioning appropriately. Pt recently signed herself out of SNF. PMH includes HTN, bipolar disorder, and back surgery.    PT Comments    Pt progressing towards goals. Reporting increased weakness and fatigue. Performed sit<>stand X 5 using RW and required mod A. Reviewed seated HEP as well. Current recommendations appropriate as pt motivated to work with therapies. Will continue to follow acutely.    Follow Up Recommendations  SNF     Equipment Recommendations  Wheelchair (measurements PT);Wheelchair cushion (measurements PT)    Recommendations for Other Services       Precautions / Restrictions Precautions Precautions: Fall Restrictions Weight Bearing Restrictions: No    Mobility  Bed Mobility               General bed mobility comments: In recliner upon entry    Transfers Overall transfer level: Needs assistance Equipment used: Rolling walker (2 wheeled) Transfers: Sit to/from Stand Sit to Stand: Mod assist         General transfer comment: Mod A to stand X 5 from recliner. Pt reporting BLE feeling weaker than in previous session. Pt unable to tolerate standing for prolonged period, so further mobility limited.  Ambulation/Gait                 Stairs             Wheelchair Mobility    Modified Rankin (Stroke Patients Only)       Balance Overall balance assessment: Needs assistance Sitting-balance support: No upper extremity supported;Feet supported Sitting balance-Leahy Scale: Fair     Standing balance support: Bilateral upper extremity supported;During functional activity Standing balance-Leahy Scale: Poor Standing balance comment: Reliant on BUE support                             Cognition Arousal/Alertness: Awake/alert Behavior During Therapy: WFL for tasks assessed/performed Overall Cognitive Status: No family/caregiver present to determine baseline cognitive functioning                                        Exercises General Exercises - Lower Extremity Long Arc Quad: AROM;Both;10 reps;Seated Hip Flexion/Marching: AROM;Both;10 reps;Seated    General Comments        Pertinent Vitals/Pain Pain Assessment: Faces Faces Pain Scale: Hurts a little bit Pain Location: back Pain Descriptors / Indicators: Aching Pain Intervention(s): Monitored during session;Limited activity within patient's tolerance;Repositioned    Home Living                      Prior Function            PT Goals (current goals can now be found in the care plan section) Acute Rehab PT Goals Patient Stated Goal: to get stronger and be independent PT Goal Formulation: With patient Time For Goal Achievement: 05/04/20 Potential to Achieve Goals: Good Progress towards PT goals: Progressing toward goals    Frequency    Min 2X/week      PT Plan Current plan remains appropriate    Co-evaluation  AM-PAC PT "6 Clicks" Mobility   Outcome Measure  Help needed turning from your back to your side while in a flat bed without using bedrails?: A Little Help needed moving from lying on your back to sitting on the side of a flat bed without using bedrails?: A Little Help needed moving to and from a bed to a chair (including a wheelchair)?: A Little Help needed standing up from a chair using your arms (e.g., wheelchair or bedside chair)?: A Little Help needed to walk in hospital room?: A Lot Help needed climbing 3-5 steps with a railing? : Total 6 Click Score: 15    End of Session Equipment Utilized During Treatment: Gait belt Activity Tolerance: Patient limited by fatigue Patient left: with call bell/phone within reach;in chair  (in recliner in ED) Nurse Communication: Mobility status PT Visit Diagnosis: Unsteadiness on feet (R26.81);Muscle weakness (generalized) (M62.81)     Time: 8182-9937 PT Time Calculation (min) (ACUTE ONLY): 21 min  Charges:  $Therapeutic Activity: 8-22 mins                     Cindee Salt, DPT  Acute Rehabilitation Services  Pager: [phone] Office: [phone]    Lehman Prom 04/22/2020, 2:00 PM

## 2020-04-22 NOTE — Progress Notes (Signed)
Patient refusing to sign payee paperwork to go to [facility]. Patient has an outstanding balance from [employer]. Patient is worried her life insurance will not get paid. Patient also spoke about her old phone at home and her glasses. CSW offered to read paperwork but patient stated that does not do her any good and she wants to study the paper. Patient also stated that she wants to go to a SNF where she can play games. Patient also stated she does not want to go somewhere boring either. CSW explained rehab is not a place where you go to play games and that she needs to be focused on getting stronger. Patient was not happy and told CSW that she was being hateful. Patient also stated she just needs a gun to take to her head. Patient also stated she wasn't enough to live either. CSW notified provider of patients comments. CSW told patient she needs to make a decision today and if she refuses then she might be discharged home.

## 2020-04-22 NOTE — Progress Notes (Addendum)
CSWs Cordarious Zeek and [place] met with Pt at bedside. Pt was asked if she was still considering [facility] and Pt steted that she does wish to go to [facility].   Pt has signed all forms to go to [facility] [facility] [facility].   Leslie has begun insurance authorization process.

## 2020-04-22 NOTE — ED Notes (Signed)
Pt crying in hallway. Pt states she does not want to go to place she does not know anything about. Pt concerned about her clothing in her apartment and phone. Pt states she has no one that could get her belongings.

## 2020-04-22 NOTE — Progress Notes (Signed)
[facility] plans to meet with patient at 1:30 PM today.

## 2020-04-22 NOTE — Progress Notes (Signed)
CSW contacted [place], and [facility]. CSW left a message

## 2020-04-22 NOTE — Progress Notes (Signed)
CSW received call from  [place] at [employer]. Pt has received insurance authorization and is going to bed 10 B. CSW updated Pt as well as floor RN

## 2020-04-25 DIAGNOSIS — E039 Hypothyroidism, unspecified: Secondary | ICD-10-CM | POA: Diagnosis not present

## 2020-04-25 DIAGNOSIS — J449 Chronic obstructive pulmonary disease, unspecified: Secondary | ICD-10-CM | POA: Diagnosis not present

## 2020-04-25 DIAGNOSIS — I129 Hypertensive chronic kidney disease with stage 1 through stage 4 chronic kidney disease, or unspecified chronic kidney disease: Secondary | ICD-10-CM | POA: Diagnosis not present

## 2020-04-25 DIAGNOSIS — E662 Morbid (severe) obesity with alveolar hypoventilation: Secondary | ICD-10-CM | POA: Diagnosis not present

## 2020-04-25 DIAGNOSIS — H0510 Unspecified chronic inflammatory disorders of orbit: Secondary | ICD-10-CM | POA: Diagnosis not present

## 2020-04-25 DIAGNOSIS — H1820 Unspecified corneal edema: Secondary | ICD-10-CM | POA: Diagnosis not present

## 2020-05-02 ENCOUNTER — Emergency Department: Payer: Medicare HMO

## 2020-05-02 ENCOUNTER — Emergency Department
Admission: EM | Admit: 2020-05-02 | Discharge: 2020-05-02 | Disposition: A | Discharge status: Home / Self Care | Payer: Medicare HMO | Attending: Emergency Medicine | Admitting: Emergency Medicine

## 2020-05-02 ENCOUNTER — Encounter: Payer: Self-pay | Admitting: Medical Oncology

## 2020-05-02 DIAGNOSIS — Z79899 Other long term (current) drug therapy: Secondary | ICD-10-CM | POA: Insufficient documentation

## 2020-05-02 DIAGNOSIS — H1820 Unspecified corneal edema: Secondary | ICD-10-CM | POA: Diagnosis not present

## 2020-05-02 DIAGNOSIS — R102 Pelvic and perineal pain: Secondary | ICD-10-CM | POA: Diagnosis not present

## 2020-05-02 DIAGNOSIS — M542 Cervicalgia: Secondary | ICD-10-CM | POA: Insufficient documentation

## 2020-05-02 DIAGNOSIS — W19XXXA Unspecified fall, initial encounter: Secondary | ICD-10-CM | POA: Insufficient documentation

## 2020-05-02 DIAGNOSIS — M533 Sacrococcygeal disorders, not elsewhere classified: Secondary | ICD-10-CM | POA: Diagnosis not present

## 2020-05-02 DIAGNOSIS — Z7951 Long term (current) use of inhaled steroids: Secondary | ICD-10-CM | POA: Insufficient documentation

## 2020-05-02 DIAGNOSIS — E039 Hypothyroidism, unspecified: Secondary | ICD-10-CM | POA: Insufficient documentation

## 2020-05-02 DIAGNOSIS — J441 Chronic obstructive pulmonary disease with (acute) exacerbation: Secondary | ICD-10-CM | POA: Diagnosis not present

## 2020-05-02 DIAGNOSIS — S0990XA Unspecified injury of head, initial encounter: Secondary | ICD-10-CM | POA: Insufficient documentation

## 2020-05-02 DIAGNOSIS — E662 Morbid (severe) obesity with alveolar hypoventilation: Secondary | ICD-10-CM | POA: Diagnosis not present

## 2020-05-02 DIAGNOSIS — M545 Low back pain, unspecified: Secondary | ICD-10-CM | POA: Insufficient documentation

## 2020-05-02 DIAGNOSIS — N183 Chronic kidney disease, stage 3 unspecified: Secondary | ICD-10-CM | POA: Diagnosis not present

## 2020-05-02 DIAGNOSIS — M160 Bilateral primary osteoarthritis of hip: Secondary | ICD-10-CM | POA: Diagnosis not present

## 2020-05-02 DIAGNOSIS — I129 Hypertensive chronic kidney disease with stage 1 through stage 4 chronic kidney disease, or unspecified chronic kidney disease: Secondary | ICD-10-CM | POA: Diagnosis not present

## 2020-05-02 DIAGNOSIS — M47816 Spondylosis without myelopathy or radiculopathy, lumbar region: Secondary | ICD-10-CM | POA: Diagnosis not present

## 2020-05-02 DIAGNOSIS — R9082 White matter disease, unspecified: Secondary | ICD-10-CM | POA: Diagnosis not present

## 2020-05-02 DIAGNOSIS — Z1159 Encounter for screening for other viral diseases: Secondary | ICD-10-CM | POA: Diagnosis not present

## 2020-05-02 DIAGNOSIS — N179 Acute kidney failure, unspecified: Secondary | ICD-10-CM | POA: Diagnosis not present

## 2020-05-02 DIAGNOSIS — G43909 Migraine, unspecified, not intractable, without status migrainosus: Secondary | ICD-10-CM | POA: Diagnosis not present

## 2020-05-02 DIAGNOSIS — M5030 Other cervical disc degeneration, unspecified cervical region: Secondary | ICD-10-CM | POA: Diagnosis not present

## 2020-05-02 DIAGNOSIS — M6281 Muscle weakness (generalized): Secondary | ICD-10-CM | POA: Diagnosis not present

## 2020-05-02 DIAGNOSIS — K5730 Diverticulosis of large intestine without perforation or abscess without bleeding: Secondary | ICD-10-CM | POA: Diagnosis not present

## 2020-05-02 DIAGNOSIS — S199XXA Unspecified injury of neck, initial encounter: Secondary | ICD-10-CM | POA: Diagnosis not present

## 2020-05-02 MED ORDER — ACETAMINOPHEN 325 MG PO TABS: 650.0000 mg | ORAL_TABLET | Freq: Four times a day (QID) | ORAL | 0 refills | Status: AC | PRN

## 2020-05-02 MED ORDER — ACETAMINOPHEN 500 MG PO TABS
1000.0000 mg | ORAL_TABLET | Freq: Once | ORAL | Status: AC
  Administered 2020-05-02: 1000 mg via ORAL
  Filled 2020-05-02: qty 2

## 2020-05-02 NOTE — ED Provider Notes (Signed)
[facility] Emergency Department Provider Note  ____________________________________________   Event Date/Time   First MD Initiated Contact with Patient 05/02/20 1459     (approximate)  I have reviewed the triage vital signs and the nursing notes.   HISTORY  Chief Complaint Fall Larey Seat Friday , buttox pain , no loc )    HPI Lindsey Rowe is a 75 y.o. female here with mild paraspinal neck pain and tailbone pain after fall.  The patient states she felt over the weekend.  She was bending down to pick up a paper and fell backwards, landing on her buttock/tailbone area than her head.  She states she fell backwards and broke a trash can as binder.  She has since had moderate paraspinal neck pain as well as severe tailbone and lower back pain.  The pain prevented her from getting out of bed much over the last 2 days, though she has been able to walk with improved pain today.  No lower extremity weakness or numbness.  No loss of bowel or bladder function.  She is not on blood thinners.  Denies any ongoing upper back pain.  No chest pain.  No upper extremity weakness or numbness.  No other complaints.  She feels otherwise at her baseline.        Past Medical History:  Diagnosis Date  . Arthritis   . Back pain   . Bipolar 1 disorder (HCC)   . Complication of anesthesia    hard to wake up anesthesia  . Depression   . GERD (gastroesophageal reflux disease)   . Headache(784.0)    migraines and tension headaches  . Hyperlipidemia   . Hypertension   . Hypothyroidism   . Thyroid disease     Patient Active Problem List   Diagnosis Date Noted  . Chronic kidney disease due to hypertension 01/22/2020  . Chronic kidney disease, stage 3 unspecified (HCC) 01/22/2020  . Fibromyalgia 01/22/2020  . Gout 01/22/2020  . Insomnia 01/22/2020  . 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 Osteoarthritis 01/22/2020  . Osteoporosis  01/22/2020  . Spinal stenosis 01/22/2020  . Thoracic aortic aneurysm without rupture (HCC) 01/22/2020  . Chronic pain of both knees 11/25/2019  . Subdural hematoma (HCC) 06/28/2019  . Kidney disease 05/14/2019  . Hyperlipidemia 04/29/2019  . COPD exacerbation (HCC) 10/08/2018  . Asthmatic bronchitis 12/02/2017  . Closed fracture of right talus 07/31/2017  . Acute respiratory failure with hypoxia (HCC) 06/27/2017  . Diastolic dysfunction 06/27/2017  . Acute lower UTI 06/27/2017  . Acute metabolic encephalopathy 06/27/2017  . Altered mental state 06/10/2017  . Pain in lower limb 05/25/2017  . Dyspnea and respiratory abnormalities 08/29/2016  . Hypoxemia 08/29/2016  . Shoulder dislocation, right, initial encounter 05/24/2016  . 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 Hyperglycemia 05/24/2016  . Anterior dislocation of right shoulder   . Syncope 03/24/2016  . Generalized weakness 09/24/2015  . Leg pain, bilateral 09/24/2015  . Depression 09/24/2015  . Loss of appetite 09/24/2015  . Leg weakness, bilateral   . Hypothyroid 12/28/2012  . Hypovolemia 12/28/2012  . Hypotension 08/13/2012  . Dehydration 08/13/2012  . AKI (acute kidney injury) (HCC) 08/13/2012  . Hypoventilation associated with obesity syndrome (HCC) 08/13/2012  . Acute respiratory failure (HCC) 07/26/2012  . Chest pain 07/26/2012  . ANKLE SPRAIN, RIGHT 03/16/2010  . DIVERTICULOSIS, COLON 10/31/2009  . SKIN LESION 09/23/2009  . Shoulder pain, right 07/05/2009  . COUGH 03/29/2009  .  Migraine 03/11/2009  . OTITIS MEDIA, RIGHT 12/13/2008  . BRUISE 12/06/2008  . ACID REFLUX DISEASE 09/13/2008  . ALLERGIC RHINITIS 05/20/2008  . UNSPECIFIED DISEASE OF HAIR AND HAIR FOLLICLES 05/20/2008  . DERMATITIS 11/12/2007  . OBESITY 07/18/2007  . HYPERCHOLESTEROLEMIA 05/13/2007  . BIPOLAR AFFECTIVE DISORDER 05/12/2007  . HYPERTENSION, BENIGN ESSENTIAL 05/12/2007  . Low back pain potentially associated with radiculopathy  05/12/2007    Past Surgical History:  Procedure Laterality Date  . APPENDECTOMY    . BACK SURGERY    . CRANIOTOMY Right 07/12/2019   Procedure: Guss Bunde for HEMATOMA EVACUATION SUBDURAL;  Surgeon: Jadene Pierini, MD;  Location: [facility] OR;  Service: Neurosurgery;  Laterality: Right;  . EYE SURGERY Bilateral    lens implant  . GASTRIC BYPASS    . LOOP RECORDER INSERTION N/A 05/25/2016   Procedure: Loop Recorder Insertion;  Surgeon: Will Jorja Loa, MD;  Location: [facility] INVASIVE CV LAB;  Service: Cardiovascular;  Laterality: N/A;  . LUMBAR LAMINECTOMY/DECOMPRESSION MICRODISCECTOMY Right 07/23/2012   Procedure: LUMBAR LAMINECTOMY/DECOMPRESSION MICRODISCECTOMY 2 LEVELS;  Surgeon: Karn Cassis, MD;  Location: [facility] NEURO ORS;  Service: Neurosurgery;  Laterality: Right;  Right Lumbar three-four  lumbar four-five Laminectomy/Foraminotomy  . NASAL SINUS SURGERY Bilateral   . TONSILLECTOMY    . WRIST SURGERY      Prior to Admission medications   Medication Sig Start Date End Date Taking? Authorizing Provider  acetaminophen (TYLENOL) 325 MG tablet Take 2 tablets (650 mg total) by mouth every 6 (six) hours as needed for mild pain, moderate pain or headache. 05/02/20   Shaune Pollack, MD  albuterol (PROVENTIL HFA;VENTOLIN HFA) 108 (90 Base) MCG/ACT inhaler Inhale 2 puffs into the lungs every 6 (six) hours as needed for wheezing or shortness of breath. 04/07/18   Charlynne Pander, MD  busPIRone (BUSPAR) 10 MG tablet Take 10 mg by mouth 2 (two) times daily. 05/01/19   [provider]  cycloSPORINE (RESTASIS) 0.05 % ophthalmic emulsion Place 1 drop into both eyes 2 (two) times daily.    [provider]  diclofenac Sodium (VOLTAREN) 1 % GEL Apply 2 g topically 4 (four) times daily.    [provider]  DULoxetine (CYMBALTA) 30 MG capsule Take 1 capsule (30 mg total) by mouth 2 (two) times daily. 05/26/16   Pearson Grippe, MD  ferrous sulfate 325 (65 FE) MG tablet Take 325 mg by mouth  daily with breakfast.    [provider]  fluticasone (FLONASE) 50 MCG/ACT nasal spray Place 1 spray into both nostrils daily as needed for allergies or rhinitis. Patient taking differently: Place 1 spray into both nostrils daily. 06/29/17   Rai, Delene Ruffini, MD  levothyroxine (SYNTHROID, LEVOTHROID) 75 MCG tablet Take 75 mcg by mouth daily before breakfast.  09/06/15   [provider]  melatonin 3 MG TABS tablet Take 3 mg by mouth at bedtime.    [provider]  Menthol, Topical Analgesic, (BIOFREEZE) 4 % GEL Apply 1 application topically 2 (two) times daily as needed (back pain). Apply to both legs    [provider]  neomycin-polymyxin b-dexamethasone (MAXITROL) 3.5-10000-0.1 SUSP Place 1 drop into the left eye in the morning and at bedtime. 04/02/20   [provider]  omeprazole (PRILOSEC) 20 MG capsule Take 20 mg by mouth 2 (two) times daily before a meal.    [provider]  Polyethyl Glycol-Propyl Glycol (SYSTANE) 0.4-0.3 % GEL ophthalmic gel Place 2 application into both eyes 3 (three) times daily.  [provider]  polyethylene glycol (MIRALAX / GLYCOLAX) 17 g packet Take 17 g by mouth daily.    [provider]  pravastatin (PRAVACHOL) 40 MG tablet Take 40 mg by mouth every evening.     [provider]  pregabalin (LYRICA) 75 MG capsule Take 75 mg by mouth 2 (two) times daily. 01/13/20   [provider]  sodium chloride (MURO 128) 5 % ophthalmic solution Place 1 drop into the left eye at bedtime.    [provider]  tiZANidine (ZANAFLEX) 2 MG tablet Take 2 mg by mouth daily. 06/24/19   [provider]  Ubrogepant (UBRELVY) 50 MG TABS Take 50 mg by mouth daily as needed (migraine).    [provider]  vitamin B-12 (CYANOCOBALAMIN) 1000 MCG tablet Take 1,000 mcg by mouth daily.    [provider]    Allergies Gabapentin, Cefadroxil, and Oxycodone  Family History   Problem Relation Age of Onset  . Heart disease Mother   . Emphysema Father   . Diabetes Sister   . Other Brother        Brain tumor - s/p excision    Social History Social History   Tobacco Use  . Smoking status: Never Smoker  . Smokeless tobacco: Never Used  Vaping Use  . Vaping Use: Never used  Substance Use Topics  . Alcohol use: Yes    Comment: social  . Drug use: No    Comment: Pt + opioids as prescribed    Review of Systems  Review of Systems  Constitutional: Negative for chills, fatigue and fever.  HENT: Negative for congestion and sore throat.   Eyes: Negative for visual disturbance.  Respiratory: Negative for cough and shortness of breath.   Cardiovascular: Negative for chest pain.  Gastrointestinal: Negative for abdominal pain, diarrhea, nausea and vomiting.  Genitourinary: Negative for flank pain.  Musculoskeletal: Positive for arthralgias and gait problem. Negative for back pain and neck pain.  Skin: Negative for rash and wound.  Allergic/Immunologic: Negative for immunocompromised state.  Neurological: Negative for weakness and numbness.  Hematological: Does not bruise/bleed easily.  All other systems reviewed and are negative.    ____________________________________________  PHYSICAL EXAM:      VITAL SIGNS: ED Triage Vitals  Enc Vitals Group     BP 05/02/20 1455 (!) 104/59     Pulse Rate 05/02/20 1455 80     Resp 05/02/20 1455 18     Temp 05/02/20 1455 98.1 F (36.7 C)     Temp Source 05/02/20 1455 Oral     SpO2 05/02/20 1455 100 %     Weight 05/02/20 1456 218 lb 4.1 oz (99 kg)     Height 05/02/20 1456 5\' 3"  (1.6 m)     Head Circumference --      Peak Flow --      Pain Score 05/02/20 1455 7     Pain Loc --      Pain Edu? --      Excl. in GC? --      Physical Exam Vitals and nursing note reviewed.  Constitutional:      General: She is not in acute distress.    Appearance: She is well-developed.  HENT:     Head: Normocephalic and  atraumatic.  Eyes:     Conjunctiva/sclera: Conjunctivae normal.  Cardiovascular:     Rate and Rhythm: Normal rate and regular rhythm.     Heart sounds: Normal heart sounds. No murmur heard. No  friction rub.  Pulmonary:     Effort: Pulmonary effort is normal. No respiratory distress.     Breath sounds: Normal breath sounds. No wheezing or rales.  Abdominal:     General: There is no distension.     Palpations: Abdomen is soft.     Tenderness: There is no abdominal tenderness.  Musculoskeletal:     Cervical back: Neck supple.     Comments: Moderate tenderness over the lower paraspinal muscles of the cervical and lumbar spine.  Diffuse sacral tenderness on palpation.  No open wounds.  No bruising.  No step-offs.  Skin:    General: Skin is warm.     Capillary Refill: Capillary refill takes less than 2 seconds.  Neurological:     Mental Status: She is alert and oriented to person, place, and time.     Motor: No abnormal muscle tone.     Comments: Strength out of 5 bilateral upper and lower extremity.  Normal sensation light touch.       ____________________________________________   LABS (all labs ordered are listed, but only abnormal results are displayed)  Labs Reviewed - No data to display  ____________________________________________  EKG:  ________________________________________  RADIOLOGY All imaging, including plain films, CT scans, and ultrasounds, independently reviewed by me, and interpretations confirmed via formal radiology reads.  ED MD interpretation:   CT Head: No acute abnormality CT C Spine: No acute abnormality CT L Spine: No acute fx CT Pelvis: no acute fx   Official radiology report(s): CT Head Wo Contrast  Result Date: 05/02/2020 CLINICAL DATA:  Fall, hit back of head EXAM: CT HEAD WITHOUT CONTRAST CT CERVICAL SPINE WITHOUT CONTRAST TECHNIQUE: Multidetector CT imaging of the head and cervical spine was performed following the standard protocol  without intravenous contrast. Multiplanar CT image reconstructions of the cervical spine were also generated. COMPARISON:  07/13/2019 FINDINGS: CT HEAD FINDINGS Brain: No evidence of acute infarction, hemorrhage, hydrocephalus, extra-axial collection or mass lesion/mass effect. Periventricular and deep white matter hypodensity. Vascular: No hyperdense vessel or unexpected calcification. Skull: Evidence of prior right frontal burr hole craniotomy. Negative for fracture or focal lesion. Sinuses/Orbits: No acute finding. Other: None. CT CERVICAL SPINE FINDINGS Alignment: Straightening of the normal cervical lordosis. Skull base and vertebrae: No acute fracture. No primary bone lesion or focal pathologic process. Soft tissues and spinal canal: No prevertebral fluid or swelling. No visible canal hematoma. Disc levels: Moderate multilevel disc space height loss and osteophytosis throughout. Upper chest: Negative. Other: None. IMPRESSION: 1. No acute intracranial pathology. Small-vessel white matter disease. 2. Evidence of prior right frontal burr hole craniotomy. 3. No fracture or static subluxation of the cervical spine. 4. Moderate multilevel disc degenerative disease of the cervical spine. Electronically Signed   By: Lauralyn Primes M.D.   On: 05/02/2020 16:31   CT Cervical Spine Wo Contrast  Result Date: 05/02/2020 CLINICAL DATA:  Fall, hit back of head EXAM: CT HEAD WITHOUT CONTRAST CT CERVICAL SPINE WITHOUT CONTRAST TECHNIQUE: Multidetector CT imaging of the head and cervical spine was performed following the standard protocol without intravenous contrast. Multiplanar CT image reconstructions of the cervical spine were also generated. COMPARISON:  07/13/2019 FINDINGS: CT HEAD FINDINGS Brain: No evidence of acute infarction, hemorrhage, hydrocephalus, extra-axial collection or mass lesion/mass effect. Periventricular and deep white matter hypodensity. Vascular: No hyperdense vessel or unexpected calcification.  Skull: Evidence of prior right frontal burr hole craniotomy. Negative for fracture or focal lesion. Sinuses/Orbits: No acute finding. Other: None. CT CERVICAL SPINE FINDINGS  Alignment: Straightening of the normal cervical lordosis. Skull base and vertebrae: No acute fracture. No primary bone lesion or focal pathologic process. Soft tissues and spinal canal: No prevertebral fluid or swelling. No visible canal hematoma. Disc levels: Moderate multilevel disc space height loss and osteophytosis throughout. Upper chest: Negative. Other: None. IMPRESSION: 1. No acute intracranial pathology. Small-vessel white matter disease. 2. Evidence of prior right frontal burr hole craniotomy. 3. No fracture or static subluxation of the cervical spine. 4. Moderate multilevel disc degenerative disease of the cervical spine. Electronically Signed   By: Lauralyn PrimesAlex  Bibbey M.D.   On: 05/02/2020 16:31   CT Lumbar Spine Wo Contrast  Result Date: 05/02/2020 CLINICAL DATA:  Back pain since falling 3 days ago. EXAM: CT LUMBAR SPINE WITHOUT CONTRAST TECHNIQUE: Multidetector CT imaging of the lumbar spine was performed without intravenous contrast administration. Multiplanar CT image reconstructions were also generated. COMPARISON:  Lumbar myelogram CT 05/27/2012. FINDINGS: Segmentation: There are 5 lumbar type vertebral bodies. Alignment: The alignment is similar to the previous study with a S shaped scoliosis, convex to the right at L1 and to the left at L4-5. The lateral alignment is near anatomic. Vertebrae: No evidence of acute fracture or traumatic subluxation. There is multilevel spondylosis with endplate degenerative change. Multilevel postsurgical changes are present. There are mild degenerative changes of the sacroiliac joints. Paraspinal and other soft tissues: No acute paraspinal findings. Postsurgical changes are present at the gastroesophageal junction. There are nonobstructing bilateral renal calculi, aortic and branch vessel  atherosclerosis and distal colonic diverticulosis. Disc levels: Multilevel spondylosis within the lower thoracic and lumbar spine. At L1-2, there is loss of disc height with disc bulging and endplate osteophyte formation asymmetric to the left, contributing to mild narrowing of the left lateral recess. L2-3: Status post laminectomy with adequate decompression of the spinal canal. Loss of disc height with annular disc bulging and endplate osteophytes asymmetric to the left. Mild narrowing of the left lateral recess and left foramen. L3-4: Status post laminectomy with adequate decompression of the spinal canal. Similar spondylosis with posterior osteophytes contributing to mild foraminal narrowing bilaterally. L4-5: Status post laminectomy with adequate decompression of the spinal canal. Chronic degenerative disc disease with loss of disc height and endplate osteophytes asymmetric to the right. There is severe right foraminal narrowing which appears chronic and unchanged. L5-S1: Loss of disc height with vacuum phenomenon and advanced bilateral facet hypertrophy. Mild spinal stenosis and mild narrowing of the lateral recesses and foramina bilaterally, similar to previous study. IMPRESSION: 1. No acute findings or explanation for the patient's symptoms. 2. Postsurgical changes and multilevel spondylosis as described, similar to previous studies. There is severe right foraminal narrowing at L4-5 and mild spinal stenosis at L5-S1. 3. Nonobstructing bilateral renal calculi. 4. Aortic Atherosclerosis (ICD10-I70.0). Electronically Signed   By: Carey BullocksWilliam  Veazey M.D.   On: 05/02/2020 16:25   CT PELVIS WO CONTRAST  Result Date: 05/02/2020 CLINICAL DATA:  Pelvic pain since falling 3 days ago. EXAM: CT PELVIS WITHOUT CONTRAST TECHNIQUE: Multidetector CT imaging of the pelvis was performed following the standard protocol without intravenous contrast. COMPARISON:  Radiographs 06/28/2019.  Pelvic CT 01/02/2007. FINDINGS: Urinary  Tract:  The distal ureters and bladder appear unremarkable. Bowel: Diverticular changes throughout the sigmoid colon. No bowel wall thickening, significant distention or surrounding inflammation. Vascular/Lymphatic: There are no enlarged pelvic or inguinal lymph nodes. Aortoiliac atherosclerosis noted. Reproductive: The uterus and ovaries appear unremarkable. No adnexal mass. Other:  No pelvic ascites. Musculoskeletal: No evidence of acute pelvic  fracture or dislocation. There are minimal degenerative changes of the hips. Multilevel spondylosis noted in the lower lumbar spine. See separate lumbar spine CT report. IMPRESSION: 1. No evidence of acute pelvic fracture or dislocation. 2. Sigmoid diverticulosis. 3. Aortic Atherosclerosis (ICD10-I70.0). Electronically Signed   By: Carey Bullocks M.D.   On: 05/02/2020 16:30    ____________________________________________  PROCEDURES   Procedure(s) performed (including Critical Care):  Procedures  ____________________________________________  INITIAL IMPRESSION / MDM / ASSESSMENT AND PLAN / ED COURSE  As part of my medical decision making, I reviewed the following data within the electronic MEDICAL RECORD NUMBER Nursing notes reviewed and incorporated, Old chart reviewed, Notes from prior ED visits, and [place] Controlled Substance Database       *Lindsey Rowe was evaluated in Emergency Department on 05/02/2020 for the symptoms described in the history of present illness. She was evaluated in the context of the global COVID-19 pandemic, which necessitated consideration that the patient might be at risk for infection with the SARS-CoV-2 virus that causes COVID-19. Institutional protocols and algorithms that pertain to the evaluation of patients at risk for COVID-19 are in a state of rapid change based on information released by regulatory bodies including the CDC and federal and state organizations. These policies and algorithms were followed during the  patient's care in the ED.  Some ED evaluations and interventions may be delayed as a result of limited staffing during the pandemic.*     Medical Decision Making: 75 year old well-appearing female here with tailbone and neck pain after fall several days ago.  She is neurovascularly intact.  No signs of cauda equina or occult cord compression.  She is ambulatory with minimal pain after Tylenol.  CT imaging reviewed as above, shows no evidence of acute fracture or traumatic injury.  She has no signs of acute radicular symptoms based on exam.  Will discharge with Tylenol and supportive care.  ____________________________________________  FINAL CLINICAL IMPRESSION(S) / ED DIAGNOSES  Final diagnoses:  Fall, initial encounter     MEDICATIONS GIVEN DURING THIS VISIT:  Medications  acetaminophen (TYLENOL) tablet 1,000 mg (1,000 mg Oral Given 05/02/20 1542)     ED Discharge Orders         Ordered    acetaminophen (TYLENOL) 325 MG tablet  Every 6 hours PRN        05/02/20 1703           Note:  This document was prepared using Dragon voice recognition software and may include unintentional dictation errors.   Shaune Pollack, MD 05/02/20 [phone]

## 2020-05-02 NOTE — Discharge Instructions (Addendum)
Take Tylenol 650 mg every 4-6 hours as needed for pain

## 2020-05-02 NOTE — ED Notes (Signed)
Report given to [facility] all questions answered

## 2020-05-02 NOTE — ED Triage Notes (Signed)
Pt from [facility], pt reports that she was bending over to pick up something when she lost her footing a fell on Friday. . Pt reports that she hit the back of her head. Denies LOC. Pt not currently on blood thinner. A/O x 4. Pt denies any issues.

## 2020-05-03 DIAGNOSIS — R52 Pain, unspecified: Secondary | ICD-10-CM | POA: Diagnosis not present

## 2020-05-03 DIAGNOSIS — R296 Repeated falls: Secondary | ICD-10-CM | POA: Diagnosis not present

## 2020-05-03 DIAGNOSIS — M6281 Muscle weakness (generalized): Secondary | ICD-10-CM | POA: Diagnosis not present

## 2020-05-03 DIAGNOSIS — R2681 Unsteadiness on feet: Secondary | ICD-10-CM | POA: Diagnosis not present

## 2020-05-06 DIAGNOSIS — H0510 Unspecified chronic inflammatory disorders of orbit: Secondary | ICD-10-CM | POA: Diagnosis not present

## 2020-05-06 DIAGNOSIS — F419 Anxiety disorder, unspecified: Secondary | ICD-10-CM | POA: Diagnosis not present

## 2020-05-06 DIAGNOSIS — Z79891 Long term (current) use of opiate analgesic: Secondary | ICD-10-CM | POA: Diagnosis not present

## 2020-05-09 DIAGNOSIS — F419 Anxiety disorder, unspecified: Secondary | ICD-10-CM | POA: Diagnosis not present

## 2020-05-11 DIAGNOSIS — G4700 Insomnia, unspecified: Secondary | ICD-10-CM | POA: Diagnosis not present

## 2020-05-11 DIAGNOSIS — F419 Anxiety disorder, unspecified: Secondary | ICD-10-CM | POA: Diagnosis not present

## 2020-05-19 DIAGNOSIS — K921 Melena: Secondary | ICD-10-CM | POA: Diagnosis not present

## 2020-05-24 ENCOUNTER — Emergency Department (HOSPITAL_BASED_OUTPATIENT_CLINIC_OR_DEPARTMENT_OTHER)
Admission: EM | Admit: 2020-05-24 | Discharge: 2020-05-25 | Disposition: A | Discharge status: Home / Self Care | Payer: Medicare HMO | Attending: Emergency Medicine | Admitting: Emergency Medicine

## 2020-05-24 ENCOUNTER — Other Ambulatory Visit: Payer: Self-pay

## 2020-05-24 ENCOUNTER — Encounter (HOSPITAL_BASED_OUTPATIENT_CLINIC_OR_DEPARTMENT_OTHER): Payer: Self-pay | Admitting: *Deleted

## 2020-05-24 ENCOUNTER — Emergency Department (HOSPITAL_BASED_OUTPATIENT_CLINIC_OR_DEPARTMENT_OTHER): Payer: Medicare HMO

## 2020-05-24 DIAGNOSIS — M19012 Primary osteoarthritis, left shoulder: Secondary | ICD-10-CM | POA: Diagnosis not present

## 2020-05-24 DIAGNOSIS — M62830 Muscle spasm of back: Secondary | ICD-10-CM | POA: Insufficient documentation

## 2020-05-24 DIAGNOSIS — M25511 Pain in right shoulder: Secondary | ICD-10-CM | POA: Insufficient documentation

## 2020-05-24 DIAGNOSIS — M25512 Pain in left shoulder: Secondary | ICD-10-CM | POA: Diagnosis not present

## 2020-05-24 DIAGNOSIS — M5134 Other intervertebral disc degeneration, thoracic region: Secondary | ICD-10-CM | POA: Diagnosis not present

## 2020-05-24 DIAGNOSIS — S51012A Laceration without foreign body of left elbow, initial encounter: Secondary | ICD-10-CM | POA: Diagnosis not present

## 2020-05-24 DIAGNOSIS — N183 Chronic kidney disease, stage 3 unspecified: Secondary | ICD-10-CM | POA: Insufficient documentation

## 2020-05-24 DIAGNOSIS — Z043 Encounter for examination and observation following other accident: Secondary | ICD-10-CM | POA: Diagnosis not present

## 2020-05-24 DIAGNOSIS — I129 Hypertensive chronic kidney disease with stage 1 through stage 4 chronic kidney disease, or unspecified chronic kidney disease: Secondary | ICD-10-CM | POA: Diagnosis not present

## 2020-05-24 DIAGNOSIS — M549 Dorsalgia, unspecified: Secondary | ICD-10-CM | POA: Diagnosis not present

## 2020-05-24 DIAGNOSIS — W050XXA Fall from non-moving wheelchair, initial encounter: Secondary | ICD-10-CM | POA: Diagnosis not present

## 2020-05-24 DIAGNOSIS — Z79899 Other long term (current) drug therapy: Secondary | ICD-10-CM | POA: Insufficient documentation

## 2020-05-24 DIAGNOSIS — M542 Cervicalgia: Secondary | ICD-10-CM | POA: Insufficient documentation

## 2020-05-24 DIAGNOSIS — M5441 Lumbago with sciatica, right side: Secondary | ICD-10-CM | POA: Diagnosis not present

## 2020-05-24 DIAGNOSIS — S199XXA Unspecified injury of neck, initial encounter: Secondary | ICD-10-CM | POA: Diagnosis not present

## 2020-05-24 DIAGNOSIS — S0990XA Unspecified injury of head, initial encounter: Secondary | ICD-10-CM | POA: Diagnosis not present

## 2020-05-24 DIAGNOSIS — S4992XA Unspecified injury of left shoulder and upper arm, initial encounter: Secondary | ICD-10-CM | POA: Diagnosis not present

## 2020-05-24 DIAGNOSIS — M5442 Lumbago with sciatica, left side: Secondary | ICD-10-CM | POA: Diagnosis not present

## 2020-05-24 DIAGNOSIS — J449 Chronic obstructive pulmonary disease, unspecified: Secondary | ICD-10-CM | POA: Diagnosis not present

## 2020-05-24 DIAGNOSIS — R519 Headache, unspecified: Secondary | ICD-10-CM | POA: Insufficient documentation

## 2020-05-24 DIAGNOSIS — G8929 Other chronic pain: Secondary | ICD-10-CM

## 2020-05-24 DIAGNOSIS — M5136 Other intervertebral disc degeneration, lumbar region: Secondary | ICD-10-CM | POA: Diagnosis not present

## 2020-05-24 DIAGNOSIS — E039 Hypothyroidism, unspecified: Secondary | ICD-10-CM | POA: Diagnosis not present

## 2020-05-24 LAB — CBC WITH DIFFERENTIAL/PLATELET
Abs Immature Granulocytes: 0.07 10*3/uL (ref 0.00–0.07)
Basophils Absolute: 0 10*3/uL (ref 0.0–0.1)
Basophils Relative: 0 %
Eosinophils Absolute: 0.1 10*3/uL (ref 0.0–0.5)
Eosinophils Relative: 0 %
HCT: 39.5 % (ref 36.0–46.0)
Hemoglobin: 12.1 g/dL (ref 12.0–15.0)
Immature Granulocytes: 0 %
Lymphocytes Relative: 16 %
Lymphs Abs: 2.6 10*3/uL (ref 0.7–4.0)
MCH: 29.2 pg (ref 26.0–34.0)
MCHC: 30.6 g/dL (ref 30.0–36.0)
MCV: 95.2 fL (ref 80.0–100.0)
Monocytes Absolute: 1.1 10*3/uL — ABNORMAL HIGH (ref 0.1–1.0)
Monocytes Relative: 6 %
Neutro Abs: 12.8 10*3/uL — ABNORMAL HIGH (ref 1.7–7.7)
Neutrophils Relative %: 78 %
Platelets: 295 10*3/uL (ref 150–400)
RBC: 4.15 MIL/uL (ref 3.87–5.11)
RDW: 13.6 % (ref 11.5–15.5)
WBC: 16.7 10*3/uL — ABNORMAL HIGH (ref 4.0–10.5)
nRBC: 0 % (ref 0.0–0.2)

## 2020-05-24 LAB — BASIC METABOLIC PANEL
Anion gap: 7 (ref 5–15)
BUN: 22 mg/dL (ref 8–23)
CO2: 35 mmol/L — ABNORMAL HIGH (ref 22–32)
Calcium: 10.1 mg/dL (ref 8.9–10.3)
Chloride: 100 mmol/L (ref 98–111)
Creatinine, Ser: 0.85 mg/dL (ref 0.44–1.00)
GFR, Estimated: >60 mL/min (ref >60)
Glucose, Bld: 103 mg/dL — ABNORMAL HIGH (ref 70–99)
Potassium: 3.9 mmol/L (ref 3.5–5.1)
Sodium: 142 mmol/L (ref 135–145)

## 2020-05-24 MED ORDER — LIDOCAINE 5 % EX PTCH
1.0000 | MEDICATED_PATCH | CUTANEOUS | Status: DC
  Administered 2020-05-24: 1 via TRANSDERMAL
  Filled 2020-05-24: qty 1

## 2020-05-24 MED ORDER — LIDOCAINE 5 % EX PTCH: 1.0000 | MEDICATED_PATCH | CUTANEOUS | 0 refills | Status: DC

## 2020-05-24 MED ORDER — TIZANIDINE HCL 2 MG PO CAPS: 2.0000 mg | ORAL_CAPSULE | Freq: Three times a day (TID) | ORAL | 0 refills | Status: AC | PRN

## 2020-05-24 MED ORDER — TIZANIDINE HCL 2 MG PO CAPS: 2.0000 mg | ORAL_CAPSULE | Freq: Three times a day (TID) | ORAL | 0 refills | Status: DC | PRN

## 2020-05-24 NOTE — Patient Outreach (Signed)
[facility]) Care Management  05/24/2020  Lindsey Rowe May 04, 1945 [id number]   Referral Date: 05/24/20 Referral Source: [employer] Report Date of Discharge: 05/21/20 Facility:  [facility] Insurance: [facility]   Referral received.  No outreach warranted at this time.  Transition of Care calls being completed via EMMI. RN CM will outreach patient for any red flags received.    Plan: RN CM will close case.    Bary Leriche, RN, MSN [facility] Care Management Care Management Coordinator Direct Line [phone] Toll Free: [phone]  Fax: [phone]

## 2020-05-24 NOTE — ED Provider Notes (Signed)
MSE was initiated and I personally evaluated the patient and placed orders (if any) at  5:49 PM on May 24, 2020.  75 year old female presents the emergency department with concern for lower back muscle spasms that go down her bilateral legs.  She denies any numbness/weakness, describes it as a cramping sensation with some sharp pain that does shoot down bilateral legs.  She has been ambulatory with her walker at baseline.  Couple weeks ago she does endorse a fall with back injury.  Denies any head injury.  Vitals are stable on arrival, she is sitting up and talking to me, scattered tenderness to palpation of the lumbar region.  The patient appears stable so that the remainder of the MSE may be completed by another provider.   Rozelle Logan, DO 05/24/20 1750

## 2020-05-24 NOTE — ED Provider Notes (Signed)
[facility] EMERGENCY DEPT Provider Note   CSN: [id number] Arrival date & time: 05/24/20  1717     History Chief Complaint  Patient presents with  . Spasms    Lindsey Rowe is a 75 y.o. female.  HPI   74 year old female past medical history of COPD on O2, HTN, HLD, chronic back pain presents the emergency department with concern for muscle spasms in her bilateral back that extend down to her bilateral legs.  Patient states she has had the symptoms chronically.  However today they are more persistent and severe.  She usually uses over-the-counter medication with relief this has been not been helping today.  She denies any radiation of the pain to her abdomen or chest.  No history of AAA.  Denies any GI or GU symptoms.  She has been using a combination of wheelchair and walker at home for almost the past year.  It appears she was recently at a facility a couple months ago and left AGAINST MEDICAL ADVICE because she wants to continue her independent care at home.  She denies any numbness/weakness of the inner thighs/lower extremities.  She did have a fall 2 weeks ago where she admits to a back injury.  Past Medical History:  Diagnosis Date  . Arthritis   . Back pain   . Bipolar 1 disorder (HCC)   . Complication of anesthesia    hard to wake up anesthesia  . Depression   . GERD (gastroesophageal reflux disease)   . Headache(784.0)    migraines and tension headaches  . Hyperlipidemia   . Hypertension   . Hypothyroidism   . Thyroid disease     Patient Active Problem List   Diagnosis Date Noted  . Chronic kidney disease due to hypertension 01/22/2020  . Chronic kidney disease, stage 3 unspecified (HCC) 01/22/2020  . Fibromyalgia 01/22/2020  . Gout 01/22/2020  . Insomnia 01/22/2020  . 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 Osteoarthritis 01/22/2020  . Osteoporosis 01/22/2020  . Spinal stenosis 01/22/2020  . 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 Chronic pain of both knees 11/25/2019  . Subdural hematoma (HCC) 06/28/2019  . Kidney disease 05/14/2019  . Hyperlipidemia 04/29/2019  . COPD exacerbation (HCC) 10/08/2018  . Asthmatic bronchitis 12/02/2017  . Closed fracture of right talus 07/31/2017  . Acute respiratory failure with hypoxia (HCC) 06/27/2017  . Diastolic dysfunction 06/27/2017  . Acute lower UTI 06/27/2017  . Acute metabolic encephalopathy 06/27/2017  . Altered mental state 06/10/2017  . Pain in lower limb 05/25/2017  . Dyspnea and respiratory abnormalities 08/29/2016  . Hypoxemia 08/29/2016  . Shoulder dislocation, right, initial encounter 05/24/2016  . 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 Hyperglycemia 05/24/2016  . Anterior dislocation of right shoulder   . Syncope 03/24/2016  . Generalized weakness 09/24/2015  . Leg pain, bilateral 09/24/2015  . Depression 09/24/2015  . Loss of appetite 09/24/2015  . Leg weakness, bilateral   . Hypothyroid 12/28/2012  . Hypovolemia 12/28/2012  . Hypotension 08/13/2012  . Dehydration 08/13/2012  . AKI (acute kidney injury) (HCC) 08/13/2012  . Hypoventilation associated with obesity syndrome (HCC) 08/13/2012  . Acute respiratory failure (HCC) 07/26/2012  . Chest pain 07/26/2012  . ANKLE SPRAIN, RIGHT 03/16/2010  . DIVERTICULOSIS, COLON 10/31/2009  . SKIN LESION 09/23/2009  . Shoulder pain, right 07/05/2009  . COUGH 03/29/2009  . Migraine 03/11/2009  . OTITIS MEDIA, RIGHT 12/13/2008  . BRUISE 12/06/2008  . ACID REFLUX  DISEASE 09/13/2008  . ALLERGIC RHINITIS 05/20/2008  . UNSPECIFIED DISEASE OF HAIR AND HAIR FOLLICLES 05/20/2008  . DERMATITIS 11/12/2007  . OBESITY 07/18/2007  . HYPERCHOLESTEROLEMIA 05/13/2007  . BIPOLAR AFFECTIVE DISORDER 05/12/2007  . HYPERTENSION, BENIGN ESSENTIAL 05/12/2007  . Low back pain potentially associated with radiculopathy 05/12/2007    Past Surgical History:  Procedure Laterality  Date  . APPENDECTOMY    . BACK SURGERY    . CRANIOTOMY Right 07/12/2019   Procedure: Guss BundeBurr Hole for HEMATOMA EVACUATION SUBDURAL;  Surgeon: Jadene Pierinistergard, Thomas A, MD;  [facility];  Service: Neurosurgery;  Laterality: Right;  . EYE SURGERY Bilateral    lens implant  . GASTRIC BYPASS    . LOOP RECORDER INSERTION N/A 05/25/2016   Procedure: Loop Recorder Insertion;  Surgeon: Will Jorja LoaMartin Camnitz, MD;  Location: [facility] INVASIVE CV LAB;  Service: Cardiovascular;  Laterality: N/A;  . LUMBAR LAMINECTOMY/DECOMPRESSION MICRODISCECTOMY Right 07/23/2012   Procedure: LUMBAR LAMINECTOMY/DECOMPRESSION MICRODISCECTOMY 2 LEVELS;  Surgeon: Karn CassisErnesto M Botero, MD;  Location: [facility] NEURO ORS;  Service: Neurosurgery;  Laterality: Right;  Right Lumbar three-four  lumbar four-five Laminectomy/Foraminotomy  . NASAL SINUS SURGERY Bilateral   . TONSILLECTOMY    . WRIST SURGERY       OB History   No obstetric history on file.     Family History  Problem Relation Age of Onset  . Heart disease Mother   . Emphysema Father   . Diabetes Sister   . Other Brother        Brain tumor - s/p excision    Social History   Tobacco Use  . Smoking status: Never Smoker  . Smokeless tobacco: Never Used  Vaping Use  . Vaping Use: Never used  Substance Use Topics  . Alcohol use: Yes    Comment: social  . Drug use: No    Comment: Pt + opioids as prescribed    Home Medications Prior to Admission medications   Medication Sig Start Date End Date Taking? Authorizing Provider  acetaminophen (TYLENOL) 325 MG tablet Take 2 tablets (650 mg total) by mouth every 6 (six) hours as needed for mild pain, moderate pain or headache. 05/02/20   Shaune PollackIsaacs, Cameron, MD  albuterol (PROVENTIL HFA;VENTOLIN HFA) 108 (90 Base) MCG/ACT inhaler Inhale 2 puffs into the lungs every 6 (six) hours as needed for wheezing or shortness of breath. 04/07/18   Charlynne PanderYao, David Hsienta, MD  busPIRone (BUSPAR) 10 MG tablet Take 10 mg by mouth 2 (two) times daily. 05/01/19    [provider]  cycloSPORINE (RESTASIS) 0.05 % ophthalmic emulsion Place 1 drop into both eyes 2 (two) times daily.    [provider]  diclofenac Sodium (VOLTAREN) 1 % GEL Apply 2 g topically 4 (four) times daily.    [provider]  DULoxetine (CYMBALTA) 30 MG capsule Take 1 capsule (30 mg total) by mouth 2 (two) times daily. 05/26/16   Pearson GrippeKim, James, MD  ferrous sulfate 325 (65 FE) MG tablet Take 325 mg by mouth daily with breakfast.    [provider]  fluticasone (FLONASE) 50 MCG/ACT nasal spray Place 1 spray into both nostrils daily as needed for allergies or rhinitis. Patient taking differently: Place 1 spray into both nostrils daily. 06/29/17   Rai, Delene Ruffiniipudeep K, MD  levothyroxine (SYNTHROID, LEVOTHROID) 75 MCG tablet Take 75 mcg by mouth daily before breakfast.  09/06/15   [provider]  melatonin 3 MG TABS tablet Take 3 mg by mouth at bedtime.    [provider]  Menthol, Topical Analgesic, (BIOFREEZE) 4 % GEL Apply 1 application topically 2 (two) times daily as needed (back pain). Apply to both legs    [provider]  neomycin-polymyxin b-dexamethasone (MAXITROL) 3.5-10000-0.1 SUSP Place 1 drop into the left eye in the morning and at bedtime. 04/02/20   [provider]  omeprazole (PRILOSEC) 20 MG capsule Take 20 mg by mouth 2 (two) times daily before a meal.    [provider]  Polyethyl Glycol-Propyl Glycol (SYSTANE) 0.4-0.3 % GEL ophthalmic gel Place 2 application into both eyes 3 (three) times daily.    [provider]  polyethylene glycol (MIRALAX / GLYCOLAX) 17 g packet Take 17 g by mouth daily.    [provider]  pravastatin (PRAVACHOL) 40 MG tablet Take 40 mg by mouth every evening.     [provider]  pregabalin (LYRICA) 75 MG capsule Take 75 mg by mouth 2 (two) times daily. 01/13/20   [provider]  sodium chloride (MURO 128) 5 % ophthalmic solution Place 1 drop into  the left eye at bedtime.    [provider]  tiZANidine (ZANAFLEX) 2 MG tablet Take 2 mg by mouth daily. 06/24/19   [provider]  Ubrogepant (UBRELVY) 50 MG TABS Take 50 mg by mouth daily as needed (migraine).    [provider]  vitamin B-12 (CYANOCOBALAMIN) 1000 MCG tablet Take 1,000 mcg by mouth daily.    [provider]    Allergies    Gabapentin, Cefadroxil, and Oxycodone  Review of Systems   Review of Systems  Constitutional: Negative for chills and fever.  HENT: Negative for congestion.   Eyes: Negative for visual disturbance.  Respiratory: Negative for shortness of breath.   Cardiovascular: Negative for chest pain.  Gastrointestinal: Negative for abdominal pain, diarrhea and vomiting.  Genitourinary: Negative for dysuria.  Musculoskeletal: Positive for back pain.       Leg pain and cramps  Skin: Negative for rash.  Neurological: Negative for headaches.    Physical Exam Updated Vital Signs BP 131/73 (BP Location: Right Arm)   Pulse 61   Temp 98.1 F (36.7 C)   Resp 18   Ht 5\' 3"  (1.6 m)   Wt 97.5 kg   SpO2 100%   BMI 38.09 kg/m   Physical Exam Vitals and nursing note reviewed.  Constitutional:      Appearance: Normal appearance.  HENT:     Head: Normocephalic.     Mouth/Throat:     Mouth: Mucous membranes are moist.  Cardiovascular:     Rate and Rhythm: Normal rate.  Pulmonary:     Effort: Pulmonary effort is normal. No respiratory distress.  Abdominal:     General: There is no distension.     Palpations: Abdomen is soft. There is no mass.     Tenderness: There is no abdominal tenderness.  Musculoskeletal:     Comments: Diffuse tenderness to palpation of the lower lumbar musculature going into the bilateral SI/buttocks, she is neurovascularly intact and baseline in the lower extremities, no leg discoloration  Skin:    General: Skin is warm.  Neurological:     Mental Status: She is alert and oriented to person,  place, and time. Mental status is at baseline.  Psychiatric:        Mood and Affect: Mood normal.     ED Results / Procedures / Treatments   Labs (all labs ordered are listed, but only abnormal results are displayed) Labs Reviewed  CBC WITH DIFFERENTIAL/PLATELET - Abnormal; Notable for the following components:      Result Value   WBC 16.7 (*)    Neutro Abs 12.8 (*)    Monocytes Absolute 1.1 (*)    All other components within normal limits  BASIC METABOLIC PANEL - Abnormal; Notable for the following components:   CO2 35 (*)    Glucose, Bld 103 (*)    All other components within normal limits    EKG None  Radiology No results found.  Procedures Procedures   Medications Ordered in ED Medications  lidocaine (LIDODERM) 5 % 1 patch (has no administration in time range)    ED Course  I have reviewed the triage vital signs and the nursing notes.  Pertinent labs & imaging results that were available during my care of the patient were reviewed by me and considered in my medical decision making (see chart for details).    MDM Rules/Calculators/A&P                          75 year old female presents emergency department acute on chronic bilateral lower back pain and muscle spasm with pain radiating down both of her legs.  Patient was recently at a facility, left AMA so that she can continue her care independently at home.  She uses a combination of walker and wheelchair to care for herself.  She states when the pain gets this bad that she has required muscle relaxers in the past.  No history of AAA, abdomen is soft and benign.  Patient admits to a fall recently with back injury/pain.  CT imaging of the thoracic and lumbar spine are comparable to recent CT imaging with severe multilevel degenerative disease and foraminal narrowing/stenosis.  Patient has no new acute neurologic findings that would warrant an MRI at this time.  She has had some relief with the Lidoderm patches.   Patient most likely needs follow-up with spine specialty and potential MRI, patient declines transfer for MRI at this time, I do not believe that that needs to be emergently done tonight as she is neurologically baseline and is happy to go home, she is only requesting transportation.  I had a long discussion with the patient and her safety at home.  I recommended a face-to-face evaluation by her social work team and she has agreed to this.  Order has been placed.  Patient will be discharged and treated as an outpatient.  Discharge plan and strict return to ED precautions discussed, patient verbalizes understanding and agreement.  Final Clinical Impression(s) / ED Diagnoses Final diagnoses:  None    Rx / DC Orders ED Discharge Orders    None       Rozelle Logan, DO 05/24/20 2201

## 2020-05-24 NOTE — ED Notes (Signed)
Patient assisted to the bathroom without incident. Patient had difficulty rising from toilet and needed x3 person assistance getting up from toilet.  Patient assisted back to room using Steady. Patient able to sit in chair easier.  Patient given food and drink.  [facility] called for transport.

## 2020-05-24 NOTE — ED Notes (Signed)
[facility] called at 1012pm, 5 people ahead of patient

## 2020-05-24 NOTE — Discharge Instructions (Signed)
You have been seen and discharged from the emergency department.  Take muscle relaxer medication as directed.  Do not mix this medication with alcohol or other sedating medications. Do not drive or do heavy physical activity and to know how this medication affects you.  It may cause drowsiness.  Use Lidoderm patches as prescribed.  Follow-up with your primary provider for reevaluation and further care. Take home medications as prescribed. If you have any worsening symptoms, incontinence, numbness of the lower extremities or further concerns for your health please return to an emergency department for further evaluation.

## 2020-05-24 NOTE — ED Triage Notes (Signed)
Muscles spasms from the back all the way to her bilateral lower extremities started this morning and it gets worst.

## 2020-05-25 ENCOUNTER — Emergency Department (HOSPITAL_COMMUNITY): Payer: Medicare HMO

## 2020-05-25 ENCOUNTER — Emergency Department (HOSPITAL_COMMUNITY)
Admission: EM | Admit: 2020-05-25 | Discharge: 2020-05-26 | Disposition: A | Discharge status: Home / Self Care | Payer: Medicare HMO | Source: Home / Self Care | Attending: Emergency Medicine | Admitting: Emergency Medicine

## 2020-05-25 ENCOUNTER — Encounter (HOSPITAL_COMMUNITY): Payer: Self-pay

## 2020-05-25 DIAGNOSIS — E039 Hypothyroidism, unspecified: Secondary | ICD-10-CM | POA: Insufficient documentation

## 2020-05-25 DIAGNOSIS — J441 Chronic obstructive pulmonary disease with (acute) exacerbation: Secondary | ICD-10-CM | POA: Insufficient documentation

## 2020-05-25 DIAGNOSIS — W19XXXA Unspecified fall, initial encounter: Secondary | ICD-10-CM | POA: Diagnosis not present

## 2020-05-25 DIAGNOSIS — N183 Chronic kidney disease, stage 3 unspecified: Secondary | ICD-10-CM | POA: Insufficient documentation

## 2020-05-25 DIAGNOSIS — S0990XA Unspecified injury of head, initial encounter: Secondary | ICD-10-CM | POA: Diagnosis not present

## 2020-05-25 DIAGNOSIS — S51012A Laceration without foreign body of left elbow, initial encounter: Secondary | ICD-10-CM | POA: Insufficient documentation

## 2020-05-25 DIAGNOSIS — M25512 Pain in left shoulder: Secondary | ICD-10-CM | POA: Diagnosis not present

## 2020-05-25 DIAGNOSIS — R519 Headache, unspecified: Secondary | ICD-10-CM | POA: Insufficient documentation

## 2020-05-25 DIAGNOSIS — S199XXA Unspecified injury of neck, initial encounter: Secondary | ICD-10-CM | POA: Diagnosis not present

## 2020-05-25 DIAGNOSIS — Z743 Need for continuous supervision: Secondary | ICD-10-CM | POA: Diagnosis not present

## 2020-05-25 DIAGNOSIS — M542 Cervicalgia: Secondary | ICD-10-CM | POA: Diagnosis not present

## 2020-05-25 DIAGNOSIS — W050XXA Fall from non-moving wheelchair, initial encounter: Secondary | ICD-10-CM | POA: Insufficient documentation

## 2020-05-25 DIAGNOSIS — M25519 Pain in unspecified shoulder: Secondary | ICD-10-CM | POA: Diagnosis not present

## 2020-05-25 DIAGNOSIS — S4992XA Unspecified injury of left shoulder and upper arm, initial encounter: Secondary | ICD-10-CM | POA: Diagnosis not present

## 2020-05-25 DIAGNOSIS — Z79899 Other long term (current) drug therapy: Secondary | ICD-10-CM | POA: Insufficient documentation

## 2020-05-25 DIAGNOSIS — M25511 Pain in right shoulder: Secondary | ICD-10-CM | POA: Insufficient documentation

## 2020-05-25 DIAGNOSIS — I959 Hypotension, unspecified: Secondary | ICD-10-CM | POA: Diagnosis not present

## 2020-05-25 DIAGNOSIS — I129 Hypertensive chronic kidney disease with stage 1 through stage 4 chronic kidney disease, or unspecified chronic kidney disease: Secondary | ICD-10-CM | POA: Insufficient documentation

## 2020-05-25 DIAGNOSIS — R279 Unspecified lack of coordination: Secondary | ICD-10-CM | POA: Diagnosis not present

## 2020-05-25 DIAGNOSIS — M62830 Muscle spasm of back: Secondary | ICD-10-CM | POA: Diagnosis not present

## 2020-05-25 DIAGNOSIS — Z043 Encounter for examination and observation following other accident: Secondary | ICD-10-CM | POA: Diagnosis not present

## 2020-05-25 DIAGNOSIS — M19012 Primary osteoarthritis, left shoulder: Secondary | ICD-10-CM | POA: Diagnosis not present

## 2020-05-25 MED ORDER — ACETAMINOPHEN 325 MG PO TABS
650.0000 mg | ORAL_TABLET | ORAL | Status: DC | PRN
  Administered 2020-05-25: 650 mg via ORAL
  Filled 2020-05-25: qty 2

## 2020-05-25 MED ORDER — BACITRACIN ZINC 500 UNIT/GM EX OINT
TOPICAL_OINTMENT | Freq: Two times a day (BID) | CUTANEOUS | Status: DC
  Administered 2020-05-25: 2 via TOPICAL
  Filled 2020-05-25: qty 2.7

## 2020-05-25 NOTE — ED Notes (Signed)
Placed pt on purewick  

## 2020-05-25 NOTE — ED Triage Notes (Signed)
Patient BIB [facility] for a fall out of wheelchair onto asphalt. EMS reports no LOC, patient did hit the left side.Patient does have c/o headache, no hematoma noted. Patient dos have c/o HA, neck and bilateral shoulder pain. Patient can move all 4 extremities. No change in mental status. Patient is from [facility] [employer] [facility].

## 2020-05-25 NOTE — ED Notes (Signed)
Patient is in xray 

## 2020-05-25 NOTE — ED Provider Notes (Signed)
[facility]-EMERGENCY DEPT Provider Note   CSN: [id number] Arrival date & time: 05/25/20  1530     History Chief Complaint  Patient presents with  . Fall    Karna DupesDeborah C Beaufort is a 75 y.o. female.  Pt presents to the ED today with a fall.  Pt said she was wheeling her wheelchair to her apartment, when she went off a curb and fell.  Pt c/o head, neck, bilateral shoulder, elbow pain.  The pt was at [facility] yesterday for back pain.        Past Medical History:  Diagnosis Date  . Arthritis   . Back pain   . Bipolar 1 disorder (HCC)   . Complication of anesthesia    hard to wake up anesthesia  . Depression   . GERD (gastroesophageal reflux disease)   . Headache(784.0)    migraines and tension headaches  . Hyperlipidemia   . Hypertension   . Hypothyroidism   . Thyroid disease     Patient Active Problem List   Diagnosis Date Noted  . Chronic kidney disease due to hypertension 01/22/2020  . Chronic kidney disease, stage 3 unspecified (HCC) 01/22/2020  . Fibromyalgia 01/22/2020  . Gout 01/22/2020  . Insomnia 01/22/2020  . 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 Osteoarthritis 01/22/2020  . Osteoporosis 01/22/2020  . Spinal stenosis 01/22/2020  . Thoracic aortic aneurysm without rupture (HCC) 01/22/2020  . Chronic pain of both knees 11/25/2019  . Subdural hematoma (HCC) 06/28/2019  . Kidney disease 05/14/2019  . Hyperlipidemia 04/29/2019  . COPD exacerbation (HCC) 10/08/2018  . Asthmatic bronchitis 12/02/2017  . Closed fracture of right talus 07/31/2017  . Acute respiratory failure with hypoxia (HCC) 06/27/2017  . Diastolic dysfunction 06/27/2017  . Acute lower UTI 06/27/2017  . Acute metabolic encephalopathy 06/27/2017  . Altered mental state 06/10/2017  . Pain in lower limb 05/25/2017  . Dyspnea and respiratory abnormalities 08/29/2016  . Hypoxemia 08/29/2016  . Shoulder dislocation,  right, initial encounter 05/24/2016  . 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 Hyperglycemia 05/24/2016  . Anterior dislocation of right shoulder   . Syncope 03/24/2016  . Generalized weakness 09/24/2015  . Leg pain, bilateral 09/24/2015  . Depression 09/24/2015  . Loss of appetite 09/24/2015  . Leg weakness, bilateral   . Hypothyroid 12/28/2012  . Hypovolemia 12/28/2012  . Hypotension 08/13/2012  . Dehydration 08/13/2012  . AKI (acute kidney injury) (HCC) 08/13/2012  . Hypoventilation associated with obesity syndrome (HCC) 08/13/2012  . Acute respiratory failure (HCC) 07/26/2012  . Chest pain 07/26/2012  . ANKLE SPRAIN, RIGHT 03/16/2010  . DIVERTICULOSIS, COLON 10/31/2009  . SKIN LESION 09/23/2009  . Shoulder pain, right 07/05/2009  . COUGH 03/29/2009  . Migraine 03/11/2009  . OTITIS MEDIA, RIGHT 12/13/2008  . BRUISE 12/06/2008  . ACID REFLUX DISEASE 09/13/2008  . ALLERGIC RHINITIS 05/20/2008  . UNSPECIFIED DISEASE OF HAIR AND HAIR FOLLICLES 05/20/2008  . DERMATITIS 11/12/2007  . OBESITY 07/18/2007  . HYPERCHOLESTEROLEMIA 05/13/2007  . BIPOLAR AFFECTIVE DISORDER 05/12/2007  . HYPERTENSION, BENIGN ESSENTIAL 05/12/2007  . Low back pain potentially associated with radiculopathy 05/12/2007    Past Surgical History:  Procedure Laterality Date  . APPENDECTOMY    . BACK SURGERY    . BRAIN SURGERY  06/2019  . CRANIOTOMY Right 07/12/2019   Procedure: Guss BundeBurr Hole for HEMATOMA EVACUATION SUBDURAL;  Surgeon: Jadene Pierinistergard, Thomas A, MD;  [facility];  Service: Neurosurgery;  Laterality: Right;  . EYE  SURGERY Bilateral    lens implant  . GASTRIC BYPASS    . LOOP RECORDER INSERTION N/A 05/25/2016   Procedure: Loop Recorder Insertion;  Surgeon: Will Jorja Loa, MD;  Location: [facility] INVASIVE CV LAB;  Service: Cardiovascular;  Laterality: N/A;  . LUMBAR LAMINECTOMY/DECOMPRESSION MICRODISCECTOMY Right 07/23/2012   Procedure: LUMBAR LAMINECTOMY/DECOMPRESSION MICRODISCECTOMY 2 LEVELS;   Surgeon: Karn Cassis, MD;  Location: [facility] NEURO ORS;  Service: Neurosurgery;  Laterality: Right;  Right Lumbar three-four  lumbar four-five Laminectomy/Foraminotomy  . NASAL SINUS SURGERY Bilateral   . TONSILLECTOMY    . WRIST SURGERY       OB History   No obstetric history on file.     Family History  Problem Relation Age of Onset  . Heart disease Mother   . Emphysema Father   . Diabetes Sister   . Other Brother        Brain tumor - s/p excision    Social History   Tobacco Use  . Smoking status: Never Smoker  . Smokeless tobacco: Never Used  Vaping Use  . Vaping Use: Never used  Substance Use Topics  . Alcohol use: Not Currently    Comment: social  . Drug use: No    Comment: Pt + opioids as prescribed    Home Medications Prior to Admission medications   Medication Sig Start Date End Date Taking? Authorizing Provider  acetaminophen (TYLENOL) 500 MG tablet Take 1,000 mg by mouth every 6 (six) hours as needed for moderate pain.   Yes [provider]  albuterol (PROVENTIL HFA;VENTOLIN HFA) 108 (90 Base) MCG/ACT inhaler Inhale 2 puffs into the lungs every 6 (six) hours as needed for wheezing or shortness of breath. 04/07/18  Yes Charlynne Pander, MD  busPIRone (BUSPAR) 10 MG tablet Take 10 mg by mouth 2 (two) times daily. 05/01/19  Yes [provider]  cholecalciferol (VITAMIN D3) 25 MCG (1000 UNIT) tablet Take 1,000 Units by mouth daily.   Yes [provider]  cycloSPORINE (RESTASIS) 0.05 % ophthalmic emulsion Place 1 drop into both eyes 2 (two) times daily.   Yes [provider]  diclofenac Sodium (VOLTAREN) 1 % GEL Apply 2 g topically 4 (four) times daily.   Yes [provider]  DULoxetine (CYMBALTA) 30 MG capsule Take 1 capsule (30 mg total) by mouth 2 (two) times daily. 05/26/16  Yes Pearson Grippe, MD  levothyroxine (SYNTHROID, LEVOTHROID) 75 MCG tablet Take 75 mcg by mouth daily before breakfast.  09/06/15  Yes [provider]  lidocaine (LIDODERM) 5 % Place 1 patch onto the skin daily. Remove & Discard patch within 12 hours or as directed by MD 05/24/20  Yes Horton, Clabe Seal, DO  melatonin 3 MG TABS tablet Take 3 mg by mouth at bedtime.   Yes [provider]  Menthol, Topical Analgesic, (BIOFREEZE) 4 % GEL Apply 1 application topically 2 (two) times daily as needed (back pain). Apply to both legs   Yes [provider]  neomycin-polymyxin b-dexamethasone (MAXITROL) 3.5-10000-0.1 SUSP Place 1 drop into the left eye in the morning and at bedtime. 04/02/20  Yes [provider]  omeprazole (PRILOSEC) 20 MG capsule Take 20 mg by mouth 2 (two) times daily before a meal.   Yes [provider]  Polyethyl Glycol-Propyl Glycol (SYSTANE) 0.4-0.3 % GEL ophthalmic gel Place 2 application into both eyes 3 (three) times daily.   Yes [provider]  pravastatin (PRAVACHOL) 40 MG tablet Take 40 mg by mouth every  evening.    Yes [provider]  pregabalin (LYRICA) 75 MG capsule Take 75 mg by mouth 2 (two) times daily. 01/13/20  Yes [provider]  Ubrogepant (UBRELVY) 50 MG TABS Take 50 mg by mouth daily as needed (migraine).   Yes [provider]  vitamin B-12 (CYANOCOBALAMIN) 1000 MCG tablet Take 1,000 mcg by mouth daily.   Yes [provider]  acetaminophen (TYLENOL) 325 MG tablet Take 2 tablets (650 mg total) by mouth every 6 (six) hours as needed for mild pain, moderate pain or headache. Patient not taking: No sig reported 05/02/20   Shaune Pollack, MD  fluticasone [facility]) 50 MCG/ACT nasal spray Place 1 spray into both nostrils daily as needed for allergies or rhinitis. Patient not taking: No sig reported 06/29/17   Rai, Delene Ruffini, MD  tizanidine (ZANAFLEX) 2 MG capsule Take 1 capsule (2 mg total) by mouth 3 (three) times daily as needed for up to 7 days for muscle spasms. Patient not taking: No sig reported 05/24/20 05/31/20  Horton,  Clabe Seal, DO    Allergies    Gabapentin, Cefadroxil, and Oxycodone  Review of Systems   Review of Systems  Musculoskeletal: Positive for neck pain.       Bilateral shoulder pain, left elbow pain  All other systems reviewed and are negative.   Physical Exam Updated Vital Signs BP 116/73   Pulse 92   Temp 98.3 F (36.8 C) (Oral)   Resp 18   Ht  (1.6 m)   Wt 98 kg   SpO2 99%   BMI 38.26 kg/m   Physical Exam Vitals and nursing note reviewed.  Constitutional:      Appearance: Normal appearance.  HENT:     Head: Normocephalic and atraumatic.     Right Ear: External ear normal.     Left Ear: External ear normal.     Nose: Nose normal.     Mouth/Throat:     Mouth: Mucous membranes are moist.     Pharynx: Oropharynx is clear.  Eyes:     Extraocular Movements: Extraocular movements intact.     Conjunctiva/sclera: Conjunctivae normal.     Pupils: Pupils are equal, round, and reactive to light.  Cardiovascular:     Rate and Rhythm: Normal rate and regular rhythm.     Pulses: Normal pulses.     Heart sounds: Normal heart sounds.  Pulmonary:     Effort: Pulmonary effort is normal.     Breath sounds: Normal breath sounds.  Abdominal:     General: Abdomen is flat. Bowel sounds are normal.     Palpations: Abdomen is soft.  Musculoskeletal:        General: Normal range of motion.     Cervical back: Normal range of motion and neck supple.  Skin:    General: Skin is warm.     Capillary Refill: Capillary refill takes less than 2 seconds.     Comments: Small skin tear left elbow  Neurological:     General: No focal deficit present.     Mental Status: She is alert and oriented to person, place, and time.  Psychiatric:        Mood and Affect: Mood normal.        Behavior: Behavior normal.        Thought Content: Thought content normal.        Judgment: Judgment normal.     ED Results / Procedures / Treatments   Labs (all  labs ordered are listed, but only abnormal  results are displayed) Labs Reviewed - No data to display  EKG None  Radiology DG Chest 2 View  Result Date: 05/25/2020 CLINICAL DATA:  Fall EXAM: CHEST - 2 VIEW COMPARISON:  04/19/2020 FINDINGS: Recording device over the left lower anterior chest. No focal opacity or pleural effusion. Normal cardiomediastinal silhouette. No pneumothorax. Old right upper rib fractures. IMPRESSION: No active cardiopulmonary disease. Electronically Signed   By: [name] Pang M.D.   On: 05/25/2020 17:02   DG Pelvis 1-2 Views  Result Date: 05/25/2020 CLINICAL DATA:  Fall EXAM: PELVIS - 1-2 VIEW COMPARISON:  06/28/2019, CT 05/02/2020 FINDINGS: SI joints are non widened. Pubic symphysis and rami appear intact. No fracture or malalignment. IMPRESSION: Negative. Electronically Signed   By: [name] Pang M.D.   On: 05/25/2020 17:03   DG Shoulder Right  Result Date: 05/25/2020 CLINICAL DATA:  Fall EXAM: RIGHT SHOULDER - 2+ VIEW COMPARISON:  07/13/2019 FINDINGS: Mild AC joint degenerative change. High-riding right humeral head consistent with rotator cuff disease. No acute displaced fracture. Chronic appearing Hill-Sachs deformity. Moderate degenerative changes at the glenohumeral interval. IMPRESSION: No acute osseous abnormality. Chronic appearing Hill-Sachs deformity. High-riding humeral head suspicious for rotator cuff disease. Electronically Signed   By: [name] Pang M.D.   On: 05/25/2020 17:04   DG Elbow Complete Left  Result Date: 05/25/2020 CLINICAL DATA:  Fall EXAM: LEFT ELBOW - COMPLETE 3+ VIEW COMPARISON:  None. FINDINGS: No significant elbow effusion. No definitive acute displaced fracture or malalignment. There is spurring at the radial head. IMPRESSION: No definite acute osseous abnormality. Electronically Signed   By: [name] Pang M.D.   On: 05/25/2020 17:07   CT Head Wo Contrast  Result Date: 05/25/2020 CLINICAL DATA:  Head trauma, minor. Neck trauma. Additional history provided: F fall out of  wheelchair onto asphalt hitting left side, headache, neck and bilateral shoulder pain. EXAM: CT HEAD WITHOUT CONTRAST CT CERVICAL SPINE WITHOUT CONTRAST TECHNIQUE: Multidetector CT imaging of the head and cervical spine was performed following the standard protocol without intravenous contrast. Multiplanar CT image reconstructions of the cervical spine were also generated. COMPARISON:  Prior head CT examinations 05/02/2020 and earlier. CT of the cervical spine 05/02/2020. FINDINGS: CT HEAD FINDINGS Brain: Stable generalized cerebral atrophy. Redemonstrated ill-defined hypoattenuation within the cerebral white matter which is nonspecific, but compatible with chronic small vessel ischemic disease. There is no acute intracranial hemorrhage. No demarcated cortical infarct. No extra-axial fluid collection. No evidence of intracranial mass. No midline shift. Vascular: No hyperdense vessel.  Atherosclerotic calcifications. Skull: No calvarial fracture. Redemonstrated right parietal burr hole. Sinuses/Orbits: Visualized orbits show no acute finding. Mild mucosal thickening and fluid within the bilateral ethmoid air cells. CT CERVICAL SPINE FINDINGS Alignment: Straightening of the expected cervical lordosis. Mild C4-C5 and C7-T1 grade 1 anterolisthesis, chronic. Skull base and vertebrae: The basion-dental and atlanto-dental intervals are maintained.No evidence of acute fracture to the cervical spine. Soft tissues and spinal canal: No prevertebral fluid or swelling. No visible canal hematoma. Disc levels: Cervical spondylosis with multilevel disc space narrowing, disc bulges, uncovertebral hypertrophy and facet arthrosis. Disc space narrowing is advanced at C3-C4, C5-C6 and C6-C7. Multilevel spinal canal stenosis. Most notably, a disc bulge and endplate spurring contribute to suspected moderate spinal canal stenosis at C3-C4. Multilevel bony neural foraminal narrowing. Upper chest: No consolidation within the imaged lung  apices. No visible pneumothorax. Other: 13 mm right thyroid lobe nodule not meeting current size criteria for ultrasound follow-up. IMPRESSION: CT  head: 1. No evidence of acute intracranial abnormality. 2. Stable generalized cerebral atrophy and chronic small vessel ischemic disease. 3. Mild bilateral ethmoid sinusitis. CT cervical spine: 1. No evidence of acute fracture to the cervical spine. 2. Straightening of the expected cervical lordosis. 3. Mild chronic C4-C5 and C7-T1 grade 1 anterolisthesis. 4. Cervical spondylosis, as described. Electronically Signed   By: Jackey Loge DO   On: 05/25/2020 17:26   CT Cervical Spine Wo Contrast  Result Date: 05/25/2020 CLINICAL DATA:  Head trauma, minor. Neck trauma. Additional history provided: F fall out of wheelchair onto asphalt hitting left side, headache, neck and bilateral shoulder pain. EXAM: CT HEAD WITHOUT CONTRAST CT CERVICAL SPINE WITHOUT CONTRAST TECHNIQUE: Multidetector CT imaging of the head and cervical spine was performed following the standard protocol without intravenous contrast. Multiplanar CT image reconstructions of the cervical spine were also generated. COMPARISON:  Prior head CT examinations 05/02/2020 and earlier. CT of the cervical spine 05/02/2020. FINDINGS: CT HEAD FINDINGS Brain: Stable generalized cerebral atrophy. Redemonstrated ill-defined hypoattenuation within the cerebral white matter which is nonspecific, but compatible with chronic small vessel ischemic disease. There is no acute intracranial hemorrhage. No demarcated cortical infarct. No extra-axial fluid collection. No evidence of intracranial mass. No midline shift. Vascular: No hyperdense vessel.  Atherosclerotic calcifications. Skull: No calvarial fracture. Redemonstrated right parietal burr hole. Sinuses/Orbits: Visualized orbits show no acute finding. Mild mucosal thickening and fluid within the bilateral ethmoid air cells. CT CERVICAL SPINE FINDINGS Alignment: Straightening  of the expected cervical lordosis. Mild C4-C5 and C7-T1 grade 1 anterolisthesis, chronic. Skull base and vertebrae: The basion-dental and atlanto-dental intervals are maintained.No evidence of acute fracture to the cervical spine. Soft tissues and spinal canal: No prevertebral fluid or swelling. No visible canal hematoma. Disc levels: Cervical spondylosis with multilevel disc space narrowing, disc bulges, uncovertebral hypertrophy and facet arthrosis. Disc space narrowing is advanced at C3-C4, C5-C6 and C6-C7. Multilevel spinal canal stenosis. Most notably, a disc bulge and endplate spurring contribute to suspected moderate spinal canal stenosis at C3-C4. Multilevel bony neural foraminal narrowing. Upper chest: No consolidation within the imaged lung apices. No visible pneumothorax. Other: 13 mm right thyroid lobe nodule not meeting current size criteria for ultrasound follow-up. IMPRESSION: CT head: 1. No evidence of acute intracranial abnormality. 2. Stable generalized cerebral atrophy and chronic small vessel ischemic disease. 3. Mild bilateral ethmoid sinusitis. CT cervical spine: 1. No evidence of acute fracture to the cervical spine. 2. Straightening of the expected cervical lordosis. 3. Mild chronic C4-C5 and C7-T1 grade 1 anterolisthesis. 4. Cervical spondylosis, as described. Electronically Signed   By: Jackey Loge DO   On: 05/25/2020 17:26   CT Thoracic Spine Wo Contrast  Result Date: 05/24/2020 CLINICAL DATA:  Back pain. EXAM: CT THORACIC AND LUMBAR SPINE WITHOUT CONTRAST TECHNIQUE: Multidetector CT imaging of the thoracic and lumbar spine was performed without intravenous contrast. Multiplanar CT image reconstructions were also generated. COMPARISON:  None. FINDINGS: CT THORACIC SPINE FINDINGS Alignment: Slight anterolisthesis of T1 on T2, likely degenerative given right facet arthropathy at this level. Mild broad levocurvature. Vertebrae: No evidence of acute fracture. Vertebral body heights are  maintained. Paraspinal and other soft tissues: Postsurgical changes at the gastroesophageal junction. Disc levels: Degenerative disc disease in the lower thoracic spine at T9 T10, T10-T11 and T11-T12 with disc height loss, endplate sclerosis and osteophytes. Lower thoracic facet hypertrophy and osteophytes with suspected at least moderate foraminal stenosis bilaterally at T11 T12 and mild to moderate foraminal stenosis on the  right at T9-T10 and T10-T11. Small calcified disc protrusion at T8-T9 without significant canal stenosis. CT LUMBAR SPINE FINDINGS Segmentation: For the purposes of this dictation and to remain consistent with prior lumbar study the inferior-most fully formed intervertebral disc is labeled L5-S1. Alignment: Similar alignment with S-shaped curvature of the lumbar spine, centered convex to the right at L1 and left at L4-L5. Near anatomic sagittal alignment. Vertebrae: No evidence of acute/interval fracture. Similar multilevel degenerative endplate changes and vacuum disc phenomenon. Paraspinal and other soft tissues: Bilateral renal calculi, partially imaged. Aortic atherosclerosis. Disc levels: No substantial change in multilevel severe degenerative change, characterized on recent CT from May 02, 2020. L1-L2: Similar disc bulge and endplate osteophytes with mild narrowing of the left subarticular recess. L2-L3: Status post laminectomy with adequate decompression of the spinal canal. Disc bulge and osteophytes, asymmetric to the left with left foraminal and left subarticular recess stenosis. L3-L4: Status post laminectomy with adequate decompression of the canal. Similar disc bulge and osteophytes with right greater than left foraminal stenosis, suspected to be at least moderate proximally on the right. L4-L5: Status post laminectomy with adequate decompression of the canal. Suspected severe right foraminal stenosis similar. L5-S1: Advanced bilateral facet hypertrophy with similar disc bulge  and canal and bilateral subarticular recess stenosis. IMPRESSION: 1. For the purposes of this dictation and to remain consistent with prior lumbar study the inferior-most fully formed intervertebral disc is labeled L5-S1. 2. No evidence of acute fracture or traumatic malalignment 3. In comparison to recent lumbar spine from April 24, 2020, similar severe multilevel degenerative change in lower thoracic and lumbar spine, including suspected severe right foraminal stenosis at L4-L5 and at least moderate proximal right foraminal stenosis at L3-L4 and likely moderate bilateral foraminal stenosis at T11-T12. Similar canal stenosis at L5-S1. An MRI could better characterize the canal/cord/foramina if clinically indicated. 4. Bilateral renal calculi, partially imaged. Electronically Signed   By: Feliberto Harts MD   On: 05/24/2020 20:15   CT Lumbar Spine Wo Contrast  Result Date: 05/24/2020 CLINICAL DATA:  Back pain. EXAM: CT THORACIC AND LUMBAR SPINE WITHOUT CONTRAST TECHNIQUE: Multidetector CT imaging of the thoracic and lumbar spine was performed without intravenous contrast. Multiplanar CT image reconstructions were also generated. COMPARISON:  None. FINDINGS: CT THORACIC SPINE FINDINGS Alignment: Slight anterolisthesis of T1 on T2, likely degenerative given right facet arthropathy at this level. Mild broad levocurvature. Vertebrae: No evidence of acute fracture. Vertebral body heights are maintained. Paraspinal and other soft tissues: Postsurgical changes at the gastroesophageal junction. Disc levels: Degenerative disc disease in the lower thoracic spine at T9 T10, T10-T11 and T11-T12 with disc height loss, endplate sclerosis and osteophytes. Lower thoracic facet hypertrophy and osteophytes with suspected at least moderate foraminal stenosis bilaterally at T11 T12 and mild to moderate foraminal stenosis on the right at T9-T10 and T10-T11. Small calcified disc protrusion at T8-T9 without significant canal  stenosis. CT LUMBAR SPINE FINDINGS Segmentation: For the purposes of this dictation and to remain consistent with prior lumbar study the inferior-most fully formed intervertebral disc is labeled L5-S1. Alignment: Similar alignment with S-shaped curvature of the lumbar spine, centered convex to the right at L1 and left at L4-L5. Near anatomic sagittal alignment. Vertebrae: No evidence of acute/interval fracture. Similar multilevel degenerative endplate changes and vacuum disc phenomenon. Paraspinal and other soft tissues: Bilateral renal calculi, partially imaged. Aortic atherosclerosis. Disc levels: No substantial change in multilevel severe degenerative change, characterized on recent CT from May 02, 2020. L1-L2: Similar disc bulge and  endplate osteophytes with mild narrowing of the left subarticular recess. L2-L3: Status post laminectomy with adequate decompression of the spinal canal. Disc bulge and osteophytes, asymmetric to the left with left foraminal and left subarticular recess stenosis. L3-L4: Status post laminectomy with adequate decompression of the canal. Similar disc bulge and osteophytes with right greater than left foraminal stenosis, suspected to be at least moderate proximally on the right. L4-L5: Status post laminectomy with adequate decompression of the canal. Suspected severe right foraminal stenosis similar. L5-S1: Advanced bilateral facet hypertrophy with similar disc bulge and canal and bilateral subarticular recess stenosis. IMPRESSION: 1. For the purposes of this dictation and to remain consistent with prior lumbar study the inferior-most fully formed intervertebral disc is labeled L5-S1. 2. No evidence of acute fracture or traumatic malalignment 3. In comparison to recent lumbar spine from April 24, 2020, similar severe multilevel degenerative change in lower thoracic and lumbar spine, including suspected severe right foraminal stenosis at L4-L5 and at least moderate proximal right  foraminal stenosis at L3-L4 and likely moderate bilateral foraminal stenosis at T11-T12. Similar canal stenosis at L5-S1. An MRI could better characterize the canal/cord/foramina if clinically indicated. 4. Bilateral renal calculi, partially imaged. Electronically Signed   By: Feliberto Harts MD   On: 05/24/2020 20:15   CT Shoulder Left Wo Contrast  Result Date: 05/25/2020 CLINICAL DATA:  75 year old female with trauma to the left shoulder. EXAM: CT OF THE UPPER LEFT EXTREMITY WITHOUT CONTRAST TECHNIQUE: Multidetector CT imaging of the upper left extremity was performed according to the standard protocol. COMPARISON:  Earlier radiograph dated 05/25/2020. FINDINGS: Bones/Joint/Cartilage No acute fracture or dislocation. The bones are osteopenic. There is mild arthritic changes of the shoulder with spurring. There is elevation of the humeral head suggestive of chronic rotator cuff injury. No large effusion. Ligaments Suboptimally assessed by CT. Muscles and Tendons No acute findings. No intramuscular hematoma or fluid collection. Soft tissues No acute findings. IMPRESSION: 1. No acute fracture or dislocation. 2. Mild arthritic changes of the shoulder. Electronically Signed   By: Elgie Collard M.D.   On: 05/25/2020 19:05   DG Shoulder Left  Result Date: 05/25/2020 CLINICAL DATA:  Fall EXAM: LEFT SHOULDER - 2+ VIEW COMPARISON:  None. FINDINGS: Moderate AC joint degenerative change. No dislocation. Questionable subtle fracture deformity at the greater tuberosity. High-riding humeral head consistent with rotator cuff disease. IMPRESSION: 1. Questionable subtle fracture deformity at the greater tuberosity. CT may be considered for further evaluation. 2. High-riding humeral head consistent with rotator cuff disease. Electronically Signed   By: [name] Pang M.D.   On: 05/25/2020 17:06    Procedures Procedures   Medications Ordered in ED Medications  bacitracin ointment (has no administration in time  range)    ED Course  I have reviewed the triage vital signs and the nursing notes.  Pertinent labs & imaging results that were available during my care of the patient were reviewed by me and considered in my medical decision making (see chart for details).    MDM Rules/Calculators/A&P                          There is a questionable fx on xray of the left shoulder, so a CT was recommended.  CT of the shoulder shows no acute fx.  Other cts and xrays do not show anything acute.  Pt requested a walker.  She is given a rx.  She mainly uses her wheelchair, but can ambulate a  short distance with a walker.  Pt is stable for d/c.  Return if worse.  Final Clinical Impression(s) / ED Diagnoses Final diagnoses:  Fall, initial encounter    Rx / DC Orders ED Discharge Orders         Ordered    For home use only DME 4 wheeled rolling walker with seat        05/25/20 2151           Jacalyn Lefevre, MD 05/25/20 2152

## 2020-05-25 NOTE — ED Notes (Signed)
[facility] called for transportation  

## 2020-05-26 DIAGNOSIS — R279 Unspecified lack of coordination: Secondary | ICD-10-CM | POA: Diagnosis not present

## 2020-05-26 DIAGNOSIS — W19XXXA Unspecified fall, initial encounter: Secondary | ICD-10-CM | POA: Diagnosis not present

## 2020-05-26 DIAGNOSIS — Z743 Need for continuous supervision: Secondary | ICD-10-CM | POA: Diagnosis not present

## 2020-05-26 DIAGNOSIS — R5381 Other malaise: Secondary | ICD-10-CM | POA: Diagnosis not present

## 2020-05-26 NOTE — ED Notes (Signed)
[facility] contacted for transport back to facility  

## 2020-05-27 ENCOUNTER — Other Ambulatory Visit: Payer: Self-pay

## 2020-05-27 ENCOUNTER — Encounter (HOSPITAL_COMMUNITY): Payer: Self-pay | Admitting: *Deleted

## 2020-05-27 ENCOUNTER — Emergency Department (HOSPITAL_COMMUNITY): Payer: Medicare HMO

## 2020-05-27 ENCOUNTER — Emergency Department (HOSPITAL_COMMUNITY)
Admission: EM | Admit: 2020-05-27 | Discharge: 2020-05-27 | Disposition: A | Discharge status: Home / Self Care | Payer: Medicare HMO | Attending: Emergency Medicine | Admitting: Emergency Medicine

## 2020-05-27 DIAGNOSIS — J449 Chronic obstructive pulmonary disease, unspecified: Secondary | ICD-10-CM | POA: Insufficient documentation

## 2020-05-27 DIAGNOSIS — M25571 Pain in right ankle and joints of right foot: Secondary | ICD-10-CM | POA: Diagnosis not present

## 2020-05-27 DIAGNOSIS — Z79899 Other long term (current) drug therapy: Secondary | ICD-10-CM | POA: Diagnosis not present

## 2020-05-27 DIAGNOSIS — Z7401 Bed confinement status: Secondary | ICD-10-CM | POA: Diagnosis not present

## 2020-05-27 DIAGNOSIS — E039 Hypothyroidism, unspecified: Secondary | ICD-10-CM | POA: Diagnosis not present

## 2020-05-27 DIAGNOSIS — N183 Chronic kidney disease, stage 3 unspecified: Secondary | ICD-10-CM | POA: Insufficient documentation

## 2020-05-27 DIAGNOSIS — F29 Unspecified psychosis not due to a substance or known physiological condition: Secondary | ICD-10-CM | POA: Diagnosis not present

## 2020-05-27 DIAGNOSIS — I129 Hypertensive chronic kidney disease with stage 1 through stage 4 chronic kidney disease, or unspecified chronic kidney disease: Secondary | ICD-10-CM | POA: Insufficient documentation

## 2020-05-27 DIAGNOSIS — S2231XA Fracture of one rib, right side, initial encounter for closed fracture: Secondary | ICD-10-CM | POA: Diagnosis not present

## 2020-05-27 DIAGNOSIS — R457 State of emotional shock and stress, unspecified: Secondary | ICD-10-CM | POA: Diagnosis not present

## 2020-05-27 DIAGNOSIS — R0602 Shortness of breath: Secondary | ICD-10-CM | POA: Diagnosis not present

## 2020-05-27 DIAGNOSIS — J80 Acute respiratory distress syndrome: Secondary | ICD-10-CM | POA: Diagnosis not present

## 2020-05-27 DIAGNOSIS — M2550 Pain in unspecified joint: Secondary | ICD-10-CM | POA: Diagnosis not present

## 2020-05-27 LAB — BASIC METABOLIC PANEL
Anion gap: 9 (ref 5–15)
BUN: 14 mg/dL (ref 8–23)
CO2: 31 mmol/L (ref 22–32)
Calcium: 8.9 mg/dL (ref 8.9–10.3)
Chloride: 101 mmol/L (ref 98–111)
Creatinine, Ser: 0.94 mg/dL (ref 0.44–1.00)
GFR, Estimated: >60 mL/min (ref >60)
Glucose, Bld: 99 mg/dL (ref 70–99)
Potassium: 3.3 mmol/L — ABNORMAL LOW (ref 3.5–5.1)
Sodium: 141 mmol/L (ref 135–145)

## 2020-05-27 LAB — CBC WITH DIFFERENTIAL/PLATELET
Abs Immature Granulocytes: 0.04 10*3/uL (ref 0.00–0.07)
Basophils Absolute: 0 10*3/uL (ref 0.0–0.1)
Basophils Relative: 0 %
Eosinophils Absolute: 0.1 10*3/uL (ref 0.0–0.5)
Eosinophils Relative: 1 %
HCT: 35 % — ABNORMAL LOW (ref 36.0–46.0)
Hemoglobin: 10.7 g/dL — ABNORMAL LOW (ref 12.0–15.0)
Immature Granulocytes: 0 %
Lymphocytes Relative: 21 %
Lymphs Abs: 2.4 10*3/uL (ref 0.7–4.0)
MCH: 29.5 pg (ref 26.0–34.0)
MCHC: 30.6 g/dL (ref 30.0–36.0)
MCV: 96.4 fL (ref 80.0–100.0)
Monocytes Absolute: 0.9 10*3/uL (ref 0.1–1.0)
Monocytes Relative: 8 %
Neutro Abs: 7.8 10*3/uL — ABNORMAL HIGH (ref 1.7–7.7)
Neutrophils Relative %: 70 %
Platelets: 266 10*3/uL (ref 150–400)
RBC: 3.63 MIL/uL — ABNORMAL LOW (ref 3.87–5.11)
RDW: 13.6 % (ref 11.5–15.5)
WBC: 11.2 10*3/uL — ABNORMAL HIGH (ref 4.0–10.5)
nRBC: 0 % (ref 0.0–0.2)

## 2020-05-27 NOTE — ED Provider Notes (Signed)
[facility] EMERGENCY DEPARTMENT Provider Note   CSN: [id number] Arrival date & time: 05/27/20  1421     History Chief Complaint  Patient presents with  . Shortness of Breath  . Anxiety    Lindsey Rowe is a 75 y.o. female.  HPI Patient has an extensive medical history including bipolar, anxiety, depression, hyperlipidemia, hypertension, hypothyroidism, chronic back pain, fall last year complicated by subdural hematoma presenting with a chief complaint of episode of shortness of breath last night now completely resolved.  Patient states she has a history of panic attacks that last night when she was in bed she felt she could not catch her breath.  She states that she has been living home alone since she was discharged from a skilled nursing facility and has had multiple complications as a result of this.  She endorses multiple falls and has presented within the system multiple times for similar.  She has been evaluated for each of these falls and has had no further falls since her last emergency department visit.  She states that she has been mostly sitting in the chair due to fear of falling and states she has not been able to get her medicines and has been struggling to perform her ADLs.  Patient has some friends who stop by occasionally but are unable to provide care for her. Patient denies fevers or chills, nausea or vomiting, syncope or shortness of breath at this time.  Patient's ambulatory with a walker.  Past Medical History:  Diagnosis Date  . Arthritis   . Back pain   . Bipolar 1 disorder (HCC)   . Complication of anesthesia    hard to wake up anesthesia  . Depression   . GERD (gastroesophageal reflux disease)   . Headache(784.0)    migraines and tension headaches  . Hyperlipidemia   . Hypertension   . Hypothyroidism   . Thyroid disease     Patient Active Problem List   Diagnosis Date Noted  . Chronic kidney disease due to hypertension 01/22/2020   . Chronic kidney disease, stage 3 unspecified (HCC) 01/22/2020  . Fibromyalgia 01/22/2020  . Gout 01/22/2020  . Insomnia 01/22/2020  . 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 Osteoarthritis 01/22/2020  . Osteoporosis 01/22/2020  . Spinal stenosis 01/22/2020  . Thoracic aortic aneurysm without rupture (HCC) 01/22/2020  . Chronic pain of both knees 11/25/2019  . Subdural hematoma (HCC) 06/28/2019  . Kidney disease 05/14/2019  . Hyperlipidemia 04/29/2019  . COPD exacerbation (HCC) 10/08/2018  . Asthmatic bronchitis 12/02/2017  . Closed fracture of right talus 07/31/2017  . Acute respiratory failure with hypoxia (HCC) 06/27/2017  . Diastolic dysfunction 06/27/2017  . Acute lower UTI 06/27/2017  . Acute metabolic encephalopathy 06/27/2017  . Altered mental state 06/10/2017  . Pain in lower limb 05/25/2017  . Dyspnea and respiratory abnormalities 08/29/2016  . Hypoxemia 08/29/2016  . Shoulder dislocation, right, initial encounter 05/24/2016  . 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 Hyperglycemia 05/24/2016  . Anterior dislocation of right shoulder   . Syncope 03/24/2016  . Generalized weakness 09/24/2015  . Leg pain, bilateral 09/24/2015  . Depression 09/24/2015  . Loss of appetite 09/24/2015  . Leg weakness, bilateral   . Hypothyroid 12/28/2012  . Hypovolemia 12/28/2012  . Hypotension 08/13/2012  . Dehydration 08/13/2012  . AKI (acute kidney injury) (HCC) 08/13/2012  . Hypoventilation associated with obesity syndrome (HCC) 08/13/2012  . Acute respiratory failure (HCC) 07/26/2012  .  Chest pain 07/26/2012  . ANKLE SPRAIN, RIGHT 03/16/2010  . DIVERTICULOSIS, COLON 10/31/2009  . SKIN LESION 09/23/2009  . Shoulder pain, right 07/05/2009  . COUGH 03/29/2009  . Migraine 03/11/2009  . OTITIS MEDIA, RIGHT 12/13/2008  . BRUISE 12/06/2008  . ACID REFLUX DISEASE 09/13/2008  . ALLERGIC RHINITIS 05/20/2008  . UNSPECIFIED DISEASE OF HAIR AND  HAIR FOLLICLES 05/20/2008  . DERMATITIS 11/12/2007  . OBESITY 07/18/2007  . HYPERCHOLESTEROLEMIA 05/13/2007  . BIPOLAR AFFECTIVE DISORDER 05/12/2007  . HYPERTENSION, BENIGN ESSENTIAL 05/12/2007  . Low back pain potentially associated with radiculopathy 05/12/2007    Past Surgical History:  Procedure Laterality Date  . APPENDECTOMY    . BACK SURGERY    . BRAIN SURGERY  06/2019  . CRANIOTOMY Right 07/12/2019   Procedure: Guss BundeBurr Hole for HEMATOMA EVACUATION SUBDURAL;  Surgeon: Jadene Pierinistergard, Thomas A, MD;  [facility];  Service: Neurosurgery;  Laterality: Right;  . EYE SURGERY Bilateral    lens implant  . GASTRIC BYPASS    . LOOP RECORDER INSERTION N/A 05/25/2016   Procedure: Loop Recorder Insertion;  Surgeon: Will Jorja LoaMartin Camnitz, MD;  Location: [facility] INVASIVE CV LAB;  Service: Cardiovascular;  Laterality: N/A;  . LUMBAR LAMINECTOMY/DECOMPRESSION MICRODISCECTOMY Right 07/23/2012   Procedure: LUMBAR LAMINECTOMY/DECOMPRESSION MICRODISCECTOMY 2 LEVELS;  Surgeon: Karn CassisErnesto M Botero, MD;  Location: [facility] NEURO ORS;  Service: Neurosurgery;  Laterality: Right;  Right Lumbar three-four  lumbar four-five Laminectomy/Foraminotomy  . NASAL SINUS SURGERY Bilateral   . TONSILLECTOMY    . WRIST SURGERY       OB History   No obstetric history on file.     Family History  Problem Relation Age of Onset  . Heart disease Mother   . Emphysema Father   . Diabetes Sister   . Other Brother        Brain tumor - s/p excision    Social History   Tobacco Use  . Smoking status: Never Smoker  . Smokeless tobacco: Never Used  Vaping Use  . Vaping Use: Never used  Substance Use Topics  . Alcohol use: Not Currently    Comment: social  . Drug use: No    Comment: Pt + opioids as prescribed    Home Medications Prior to Admission medications   Medication Sig Start Date End Date Taking? Authorizing Provider  acetaminophen (TYLENOL) 325 MG tablet Take 2 tablets (650 mg total) by mouth every 6 (six) hours as  needed for mild pain, moderate pain or headache. Patient not taking: No sig reported 05/02/20   Shaune PollackIsaacs, Cameron, MD  acetaminophen (TYLENOL) 500 MG tablet Take 1,000 mg by mouth every 6 (six) hours as needed for moderate pain.    [provider]  albuterol (PROVENTIL HFA;VENTOLIN HFA) 108 (90 Base) MCG/ACT inhaler Inhale 2 puffs into the lungs every 6 (six) hours as needed for wheezing or shortness of breath. 04/07/18   Charlynne PanderYao, David Hsienta, MD  busPIRone (BUSPAR) 10 MG tablet Take 10 mg by mouth 2 (two) times daily. 05/01/19   [provider]  cholecalciferol (VITAMIN D3) 25 MCG (1000 UNIT) tablet Take 1,000 Units by mouth daily.    [provider]  cycloSPORINE (RESTASIS) 0.05 % ophthalmic emulsion Place 1 drop into both eyes 2 (two) times daily.    [provider]  diclofenac Sodium (VOLTAREN) 1 % GEL Apply 2 g topically 4 (four) times daily.    [provider]  DULoxetine (CYMBALTA) 30 MG capsule Take 1 capsule (30 mg total) by mouth 2 (two)  times daily. 05/26/16   Pearson Grippe, MD  fluticasone (FLONASE) 50 MCG/ACT nasal spray Place 1 spray into both nostrils daily as needed for allergies or rhinitis. Patient not taking: No sig reported 06/29/17   Rai, Delene Ruffini, MD  levothyroxine (SYNTHROID, LEVOTHROID) 75 MCG tablet Take 75 mcg by mouth daily before breakfast.  09/06/15   [provider]  lidocaine (LIDODERM) 5 % Place 1 patch onto the skin daily. Remove & Discard patch within 12 hours or as directed by MD 05/24/20   Horton, Clabe Seal, DO  melatonin 3 MG TABS tablet Take 3 mg by mouth at bedtime.    [provider]  Menthol, Topical Analgesic, (BIOFREEZE) 4 % GEL Apply 1 application topically 2 (two) times daily as needed (back pain). Apply to both legs    [provider]  neomycin-polymyxin b-dexamethasone (MAXITROL) 3.5-10000-0.1 SUSP Place 1 drop into the left eye in the morning and at bedtime. 04/02/20   [provider]   omeprazole (PRILOSEC) 20 MG capsule Take 20 mg by mouth 2 (two) times daily before a meal.    [provider]  Polyethyl Glycol-Propyl Glycol (SYSTANE) 0.4-0.3 % GEL ophthalmic gel Place 2 application into both eyes 3 (three) times daily.    [provider]  pravastatin (PRAVACHOL) 40 MG tablet Take 40 mg by mouth every evening.     [provider]  pregabalin (LYRICA) 75 MG capsule Take 75 mg by mouth 2 (two) times daily. 01/13/20   [provider]  tizanidine (ZANAFLEX) 2 MG capsule Take 1 capsule (2 mg total) by mouth 3 (three) times daily as needed for up to 7 days for muscle spasms. Patient not taking: No sig reported 05/24/20 05/31/20  Horton, Kristie M, DO  Ubrogepant (UBRELVY) 50 MG TABS Take 50 mg by mouth daily as needed (migraine).    [provider]  vitamin B-12 (CYANOCOBALAMIN) 1000 MCG tablet Take 1,000 mcg by mouth daily.    [provider]    Allergies    Gabapentin, Cefadroxil, and Oxycodone  Review of Systems   Review of Systems  Constitutional: Negative for chills and fever.  HENT: Negative for ear pain and sore throat.   Eyes: Negative for pain and visual disturbance.  Respiratory: Positive for shortness of breath. Negative for cough, choking and chest tightness.   Cardiovascular: Negative for chest pain and palpitations.  Gastrointestinal: Negative for abdominal pain and vomiting.  Genitourinary: Negative for dysuria and hematuria.  Musculoskeletal: Negative for arthralgias and back pain.  Skin: Negative for color change and rash.  Neurological: Positive for weakness. Negative for seizures and syncope.  All other systems reviewed and are negative.   Physical Exam Updated Vital Signs BP 122/74   Pulse 90   Temp 98 F (36.7 C) (Oral)   Resp (!) 22   Ht  (1.6 m)   Wt 90.7 kg   SpO2 99%   BMI 35.43 kg/m   Physical Exam Vitals and nursing note reviewed.  Constitutional:      General: She is not in  acute distress.    Appearance: She is well-developed.  HENT:     Head: Normocephalic and atraumatic.  Eyes:     Conjunctiva/sclera: Conjunctivae normal.  Cardiovascular:     Rate and Rhythm: Normal rate and regular rhythm.     Heart sounds: No murmur heard.   Pulmonary:     Effort: Pulmonary effort is normal. No respiratory distress.     Breath sounds: Normal  breath sounds.  Abdominal:     General: There is no distension.     Palpations: Abdomen is soft.     Tenderness: There is no abdominal tenderness. There is no right CVA tenderness or left CVA tenderness.  Musculoskeletal:        General: No swelling or tenderness. Normal range of motion.     Cervical back: Neck supple.  Skin:    General: Skin is warm and dry.  Neurological:     General: No focal deficit present.     Mental Status: She is alert and oriented to person, place, and time. Mental status is at baseline.     Cranial Nerves: No cranial nerve deficit.     ED Results / Procedures / Treatments   Labs (all labs ordered are listed, but only abnormal results are displayed) Labs Reviewed  CBC WITH DIFFERENTIAL/PLATELET - Abnormal; Notable for the following components:      Result Value   WBC 11.2 (*)    RBC 3.63 (*)    Hemoglobin 10.7 (*)    HCT 35.0 (*)    Neutro Abs 7.8 (*)    All other components within normal limits  BASIC METABOLIC PANEL - Abnormal; Notable for the following components:   Potassium 3.3 (*)    All other components within normal limits    EKG EKG Interpretation  Date/Time:  Friday May 27 2020 16:00:39 EDT Ventricular Rate:  82 PR Interval:  151 QRS Duration: 101 QT Interval:  399 QTC Calculation: 466 R Axis:   36 Text Interpretation: Sinus rhythm Atrial premature complexes Borderline low voltage, extremity leads Confirmed by Tilden Fossa [phone]) on 05/27/2020 4:22:15 PM   Radiology CXR similar to prior  Procedures Procedures   Medications Ordered in ED Medications - No  data to display  ED Course  I have reviewed the triage vital signs and the nursing notes.  Pertinent labs & imaging results that were available during my care of the patient were reviewed by me and considered in my medical decision making (see chart for details).    MDM Rules/Calculators/A&P                           Medical Decision Making:  BAMBI FEHNEL is a 75 y.o. female with a significant medical history, who presented to the ED today with episode of shortness of breath last night now completely resolved.  Medically, patient in no acute distress at this time and patient states this feels like previous panic attack she had at home.   On my initial exam, the pt was in no acute distress.  Oxygen saturation 100% on her home 2 L oxygen, heart rate appropriate, blood pressure appropriate hand neurologic exam without any focal abnormalities..   Reviewed and confirmed nursing documentation for past medical history, family history, social history.  Given patient's medical history, we will proceed with screening labs including CBC, BMP, chest x-ray, EKG.  All of which resulted within normal limits. Given patient's multiple falls recently, progressive weakness, inability to leave house to get medicines or food, would recommend further care at this time.  Patient refusing skilled nursing placement and is resource limited.  Patient is medically cleared at this time but will engage social work to see what further resources may be available. Social work stated that patient missed her home health appointment 2 days ago and will rearrange for tomorrow.  Patient's refusing SNF transfer at this time.  Patient was able to ambulate with nursing assistance to the wheelchair which is her baseline ambulation scale.  Patient stable for discharge home at this time.   Final Clinical Impression(s) / ED Diagnoses Final diagnoses:  Shortness of breath    Rx / DC Orders ED Discharge Orders    None        Glyn Ade, MD 05/27/20 2059    Tilden Fossa, MD 05/28/20 1043

## 2020-05-27 NOTE — Care Management (Addendum)
TOC team spoke with  [facility] who states that patient was offered long term care but  she choose to leave the facility, despite staff pleading with her to reconsider, as per intake staff She told them she was going home she did not want to be in a SNF, and choose to go home. HH services were set up but wen they came out 4/20  patient was in the ED.   Discussed with patient the the only options that insurance will cover are  SNF long term or HH service. Patient states, she desires to go home with [name] services.  Patient has APS worker, [facility], and HH services in place. ED CM will contact them for an discharge visit. Patient lives alone but states she has friends that come out and check on her include her landlord.  Updated EDP resumption orders requested to send to [name]. Patient will be discharged home with [facility] services and APS worker Lindsey Rowe was updated.

## 2020-05-27 NOTE — Care Management (Signed)
[facility] called for transport [phone] 

## 2020-05-27 NOTE — Discharge Instructions (Addendum)
You are seen today for shortness of breath episode that happened last night.  You stated that it felt similar to a panic attack and that she did not feel safe without any home health services.  We have talked with social work and have arranged for them to visit you tomorrow at home.  Your labs today with no acute abnormalities and your chest x-ray looks similar to previous.  Please return with any change in your symptoms including worsening of shortness of breath, fevers or chills, nausea vomiting or chest pain.  Thank you for the opportunity take part of your care.  Please follow-up with your primary care provider and plan for visitation with home health tomorrow.

## 2020-05-27 NOTE — ED Triage Notes (Signed)
Patient presents to ed via [facility] from home states she was d/c'd from [facility]. On Monday states was at [facility] for several months prior to that after being d/c'd from [facility] ED. States she is afraid to stay home alone and she hasn't eaten or had her medication since d/c. States she gets around her house with walker and wheelchair. C/o pain over entire body

## 2020-05-30 NOTE — Progress Notes (Signed)
This encounter was created in error - please disregard.

## 2020-05-31 DIAGNOSIS — S065X0S Traumatic subdural hemorrhage without loss of consciousness, sequela: Secondary | ICD-10-CM | POA: Diagnosis not present

## 2020-05-31 DIAGNOSIS — F32A Depression, unspecified: Secondary | ICD-10-CM | POA: Diagnosis not present

## 2020-05-31 DIAGNOSIS — F319 Bipolar disorder, unspecified: Secondary | ICD-10-CM | POA: Diagnosis not present

## 2020-05-31 DIAGNOSIS — S065X9A Traumatic subdural hemorrhage with loss of consciousness of unspecified duration, initial encounter: Secondary | ICD-10-CM | POA: Diagnosis not present

## 2020-05-31 DIAGNOSIS — N183 Chronic kidney disease, stage 3 unspecified: Secondary | ICD-10-CM | POA: Diagnosis not present

## 2020-05-31 DIAGNOSIS — J449 Chronic obstructive pulmonary disease, unspecified: Secondary | ICD-10-CM | POA: Diagnosis not present

## 2020-05-31 DIAGNOSIS — I712 Thoracic aortic aneurysm, without rupture: Secondary | ICD-10-CM | POA: Diagnosis not present

## 2020-05-31 DIAGNOSIS — I129 Hypertensive chronic kidney disease with stage 1 through stage 4 chronic kidney disease, or unspecified chronic kidney disease: Secondary | ICD-10-CM | POA: Diagnosis not present

## 2020-05-31 DIAGNOSIS — G909 Disorder of the autonomic nervous system, unspecified: Secondary | ICD-10-CM | POA: Diagnosis not present

## 2020-06-02 DIAGNOSIS — E039 Hypothyroidism, unspecified: Secondary | ICD-10-CM | POA: Diagnosis not present

## 2020-06-02 DIAGNOSIS — I129 Hypertensive chronic kidney disease with stage 1 through stage 4 chronic kidney disease, or unspecified chronic kidney disease: Secondary | ICD-10-CM | POA: Diagnosis not present

## 2020-06-02 DIAGNOSIS — J45901 Unspecified asthma with (acute) exacerbation: Secondary | ICD-10-CM | POA: Diagnosis not present

## 2020-06-02 DIAGNOSIS — F319 Bipolar disorder, unspecified: Secondary | ICD-10-CM | POA: Diagnosis not present

## 2020-06-02 DIAGNOSIS — G43909 Migraine, unspecified, not intractable, without status migrainosus: Secondary | ICD-10-CM | POA: Diagnosis not present

## 2020-06-02 DIAGNOSIS — I712 Thoracic aortic aneurysm, without rupture: Secondary | ICD-10-CM | POA: Diagnosis not present

## 2020-06-02 DIAGNOSIS — E7800 Pure hypercholesterolemia, unspecified: Secondary | ICD-10-CM | POA: Diagnosis not present

## 2020-06-02 DIAGNOSIS — J449 Chronic obstructive pulmonary disease, unspecified: Secondary | ICD-10-CM | POA: Diagnosis not present

## 2020-06-02 DIAGNOSIS — N183 Chronic kidney disease, stage 3 unspecified: Secondary | ICD-10-CM | POA: Diagnosis not present

## 2020-06-03 DIAGNOSIS — M170 Bilateral primary osteoarthritis of knee: Secondary | ICD-10-CM | POA: Diagnosis not present

## 2020-06-04 DIAGNOSIS — J9601 Acute respiratory failure with hypoxia: Secondary | ICD-10-CM | POA: Diagnosis not present

## 2020-06-04 DIAGNOSIS — J45909 Unspecified asthma, uncomplicated: Secondary | ICD-10-CM | POA: Diagnosis not present

## 2020-06-04 DIAGNOSIS — R0902 Hypoxemia: Secondary | ICD-10-CM | POA: Diagnosis not present

## 2020-06-17 ENCOUNTER — Emergency Department (HOSPITAL_COMMUNITY): Payer: Medicare Other

## 2020-06-17 ENCOUNTER — Emergency Department (HOSPITAL_COMMUNITY)
Admission: EM | Admit: 2020-06-17 | Discharge: 2020-06-18 | Disposition: A | Discharge status: Home / Self Care | Payer: Medicare Other | Attending: Emergency Medicine | Admitting: Emergency Medicine

## 2020-06-17 DIAGNOSIS — R42 Dizziness and giddiness: Secondary | ICD-10-CM

## 2020-06-17 DIAGNOSIS — R55 Syncope and collapse: Secondary | ICD-10-CM | POA: Diagnosis not present

## 2020-06-17 DIAGNOSIS — R000 Tachycardia, unspecified: Secondary | ICD-10-CM | POA: Diagnosis not present

## 2020-06-17 DIAGNOSIS — E039 Hypothyroidism, unspecified: Secondary | ICD-10-CM | POA: Diagnosis not present

## 2020-06-17 DIAGNOSIS — N183 Chronic kidney disease, stage 3 unspecified: Secondary | ICD-10-CM | POA: Insufficient documentation

## 2020-06-17 DIAGNOSIS — I130 Hypertensive heart and chronic kidney disease with heart failure and stage 1 through stage 4 chronic kidney disease, or unspecified chronic kidney disease: Secondary | ICD-10-CM | POA: Insufficient documentation

## 2020-06-17 DIAGNOSIS — Z79899 Other long term (current) drug therapy: Secondary | ICD-10-CM | POA: Diagnosis not present

## 2020-06-17 DIAGNOSIS — I5030 Unspecified diastolic (congestive) heart failure: Secondary | ICD-10-CM | POA: Insufficient documentation

## 2020-06-17 DIAGNOSIS — N390 Urinary tract infection, site not specified: Secondary | ICD-10-CM

## 2020-06-17 DIAGNOSIS — J441 Chronic obstructive pulmonary disease with (acute) exacerbation: Secondary | ICD-10-CM | POA: Insufficient documentation

## 2020-06-17 DIAGNOSIS — I10 Essential (primary) hypertension: Secondary | ICD-10-CM | POA: Diagnosis not present

## 2020-06-17 DIAGNOSIS — Z20822 Contact with and (suspected) exposure to covid-19: Secondary | ICD-10-CM | POA: Insufficient documentation

## 2020-06-17 DIAGNOSIS — G43909 Migraine, unspecified, not intractable, without status migrainosus: Secondary | ICD-10-CM | POA: Diagnosis not present

## 2020-06-17 DIAGNOSIS — E1165 Type 2 diabetes mellitus with hyperglycemia: Secondary | ICD-10-CM | POA: Diagnosis not present

## 2020-06-17 LAB — CBC WITH DIFFERENTIAL/PLATELET
Abs Immature Granulocytes: 0.07 10*3/uL (ref 0.00–0.07)
Basophils Absolute: 0 10*3/uL (ref 0.0–0.1)
Basophils Relative: 0 %
Eosinophils Absolute: 0.1 10*3/uL (ref 0.0–0.5)
Eosinophils Relative: 1 %
HCT: 36 % (ref 36.0–46.0)
Hemoglobin: 10.8 g/dL — ABNORMAL LOW (ref 12.0–15.0)
Immature Granulocytes: 1 %
Lymphocytes Relative: 16 %
Lymphs Abs: 2 10*3/uL (ref 0.7–4.0)
MCH: 29.3 pg (ref 26.0–34.0)
MCHC: 30 g/dL (ref 30.0–36.0)
MCV: 97.8 fL (ref 80.0–100.0)
Monocytes Absolute: 1.3 10*3/uL — ABNORMAL HIGH (ref 0.1–1.0)
Monocytes Relative: 10 %
Neutro Abs: 8.8 10*3/uL — ABNORMAL HIGH (ref 1.7–7.7)
Neutrophils Relative %: 72 %
Platelets: 180 10*3/uL (ref 150–400)
RBC: 3.68 MIL/uL — ABNORMAL LOW (ref 3.87–5.11)
RDW: 13.8 % (ref 11.5–15.5)
WBC: 12.2 10*3/uL — ABNORMAL HIGH (ref 4.0–10.5)
nRBC: 0 % (ref 0.0–0.2)

## 2020-06-17 NOTE — ED Provider Notes (Signed)
[facility] EMERGENCY DEPARTMENT Provider Note   CSN: [id number] Arrival date & time: 06/17/20  2229     History Chief Complaint  Patient presents with  . Dizziness    Lindsey Rowe is a 75 y.o. female.  Patient presents to the emergency department for evaluation of dizziness.  Patient reports that she got up out of bed about 1 and half hours ago to go to the bathroom and felt like the room was spinning.  No associated headache or visual disturbance.  No numbness, tingling or weakness of extremities.  Patient noted worsening with movement of her head.  She is currently resting comfortably on a gurney in exam room, does not feel any dizziness right now.        Past Medical History:  Diagnosis Date  . Arthritis   . Back pain   . Bipolar 1 disorder (HCC)   . Complication of anesthesia    hard to wake up anesthesia  . Depression   . GERD (gastroesophageal reflux disease)   . Headache(784.0)    migraines and tension headaches  . Hyperlipidemia   . Hypertension   . Hypothyroidism   . Thyroid disease     Patient Active Problem List   Diagnosis Date Noted  . Chronic kidney disease due to hypertension 01/22/2020  . Chronic kidney disease, stage 3 unspecified (HCC) 01/22/2020  . Fibromyalgia 01/22/2020  . Gout 01/22/2020  . Insomnia 01/22/2020  . 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 Osteoarthritis 01/22/2020  . Osteoporosis 01/22/2020  . Spinal stenosis 01/22/2020  . Thoracic aortic aneurysm without rupture (HCC) 01/22/2020  . Chronic pain of both knees 11/25/2019  . Subdural hematoma (HCC) 06/28/2019  . Kidney disease 05/14/2019  . Hyperlipidemia 04/29/2019  . COPD exacerbation (HCC) 10/08/2018  . Asthmatic bronchitis 12/02/2017  . Closed fracture of right talus 07/31/2017  . Acute respiratory failure with hypoxia (HCC) 06/27/2017  . Diastolic dysfunction 06/27/2017  . Acute lower UTI 06/27/2017  . Acute  metabolic encephalopathy 06/27/2017  . Altered mental state 06/10/2017  . Pain in lower limb 05/25/2017  . Dyspnea and respiratory abnormalities 08/29/2016  . Hypoxemia 08/29/2016  . Shoulder dislocation, right, initial encounter 05/24/2016  . 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 Hyperglycemia 05/24/2016  . Anterior dislocation of right shoulder   . Syncope 03/24/2016  . Generalized weakness 09/24/2015  . Leg pain, bilateral 09/24/2015  . Depression 09/24/2015  . Loss of appetite 09/24/2015  . Leg weakness, bilateral   . Hypothyroid 12/28/2012  . Hypovolemia 12/28/2012  . Hypotension 08/13/2012  . Dehydration 08/13/2012  . AKI (acute kidney injury) (HCC) 08/13/2012  . Hypoventilation associated with obesity syndrome (HCC) 08/13/2012  . Acute respiratory failure (HCC) 07/26/2012  . Chest pain 07/26/2012  . ANKLE SPRAIN, RIGHT 03/16/2010  . DIVERTICULOSIS, COLON 10/31/2009  . SKIN LESION 09/23/2009  . Shoulder pain, right 07/05/2009  . COUGH 03/29/2009  . Migraine 03/11/2009  . OTITIS MEDIA, RIGHT 12/13/2008  . BRUISE 12/06/2008  . ACID REFLUX DISEASE 09/13/2008  . ALLERGIC RHINITIS 05/20/2008  . UNSPECIFIED DISEASE OF HAIR AND HAIR FOLLICLES 05/20/2008  . DERMATITIS 11/12/2007  . OBESITY 07/18/2007  . HYPERCHOLESTEROLEMIA 05/13/2007  . BIPOLAR AFFECTIVE DISORDER 05/12/2007  . HYPERTENSION, BENIGN ESSENTIAL 05/12/2007  . Low back pain potentially associated with radiculopathy 05/12/2007    Past Surgical History:  Procedure Laterality Date  . APPENDECTOMY    . BACK SURGERY    . BRAIN SURGERY  06/2019  .  CRANIOTOMY Right 07/12/2019   Procedure: Guss BundeBurr Hole for HEMATOMA EVACUATION SUBDURAL;  Surgeon: Jadene Pierinistergard, Thomas A, MD;  [facility];  Service: Neurosurgery;  Laterality: Right;  . EYE SURGERY Bilateral    lens implant  . GASTRIC BYPASS    . LOOP RECORDER INSERTION N/A 05/25/2016   Procedure: Loop Recorder Insertion;  Surgeon: Will Jorja LoaMartin Camnitz, MD;  Location:  [facility] INVASIVE CV LAB;  Service: Cardiovascular;  Laterality: N/A;  . LUMBAR LAMINECTOMY/DECOMPRESSION MICRODISCECTOMY Right 07/23/2012   Procedure: LUMBAR LAMINECTOMY/DECOMPRESSION MICRODISCECTOMY 2 LEVELS;  Surgeon: Karn CassisErnesto M Botero, MD;  Location: [facility] NEURO ORS;  Service: Neurosurgery;  Laterality: Right;  Right Lumbar three-four  lumbar four-five Laminectomy/Foraminotomy  . NASAL SINUS SURGERY Bilateral   . TONSILLECTOMY    . WRIST SURGERY       OB History   No obstetric history on file.     Family History  Problem Relation Age of Onset  . Heart disease Mother   . Emphysema Father   . Diabetes Sister   . Other Brother        Brain tumor - s/p excision    Social History   Tobacco Use  . Smoking status: Never Smoker  . Smokeless tobacco: Never Used  Vaping Use  . Vaping Use: Never used  Substance Use Topics  . Alcohol use: Not Currently    Comment: social  . Drug use: No    Comment: Pt + opioids as prescribed    Home Medications Prior to Admission medications   Medication Sig Start Date End Date Taking? Authorizing Provider  acetaminophen (TYLENOL) 325 MG tablet Take 2 tablets (650 mg total) by mouth every 6 (six) hours as needed for mild pain, moderate pain or headache. Patient not taking: No sig reported 05/02/20   Shaune PollackIsaacs, Cameron, MD  acetaminophen (TYLENOL) 500 MG tablet Take 1,000 mg by mouth every 6 (six) hours as needed for moderate pain.    [provider]  albuterol (PROVENTIL HFA;VENTOLIN HFA) 108 (90 Base) MCG/ACT inhaler Inhale 2 puffs into the lungs every 6 (six) hours as needed for wheezing or shortness of breath. 04/07/18   Charlynne PanderYao, David Hsienta, MD  busPIRone (BUSPAR) 10 MG tablet Take 10 mg by mouth 2 (two) times daily. 05/01/19   [provider]  cholecalciferol (VITAMIN D3) 25 MCG (1000 UNIT) tablet Take 1,000 Units by mouth daily.    [provider]  cycloSPORINE (RESTASIS) 0.05 % ophthalmic emulsion Place 1 drop into both eyes 2 (two)  times daily.    [provider]  diclofenac Sodium (VOLTAREN) 1 % GEL Apply 2 g topically 4 (four) times daily.    [provider]  DULoxetine (CYMBALTA) 30 MG capsule Take 1 capsule (30 mg total) by mouth 2 (two) times daily. 05/26/16   Pearson GrippeKim, James, MD  fluticasone (FLONASE) 50 MCG/ACT nasal spray Place 1 spray into both nostrils daily as needed for allergies or rhinitis. Patient not taking: No sig reported 06/29/17   Rai, Delene Ruffiniipudeep K, MD  levothyroxine (SYNTHROID, LEVOTHROID) 75 MCG tablet Take 75 mcg by mouth daily before breakfast.  09/06/15   [provider]  lidocaine (LIDODERM) 5 % Place 1 patch onto the skin daily. Remove & Discard patch within 12 hours or as directed by MD 05/24/20   Horton, Clabe SealKristie M, DO  melatonin 3 MG TABS tablet Take 3 mg by mouth at bedtime.    [provider]  Menthol, Topical Analgesic, (BIOFREEZE) 4 % GEL Apply 1 application topically 2 (two)  times daily as needed (back pain). Apply to both legs    [provider]  neomycin-polymyxin b-dexamethasone (MAXITROL) 3.5-10000-0.1 SUSP Place 1 drop into the left eye in the morning and at bedtime. 04/02/20   [provider]  omeprazole (PRILOSEC) 20 MG capsule Take 20 mg by mouth 2 (two) times daily before a meal.    [provider]  Polyethyl Glycol-Propyl Glycol (SYSTANE) 0.4-0.3 % GEL ophthalmic gel Place 2 application into both eyes 3 (three) times daily.    [provider]  pravastatin (PRAVACHOL) 40 MG tablet Take 40 mg by mouth every evening.     [provider]  pregabalin (LYRICA) 75 MG capsule Take 75 mg by mouth 2 (two) times daily. 01/13/20   [provider]  Ubrogepant (UBRELVY) 50 MG TABS Take 50 mg by mouth daily as needed (migraine).    [provider]  vitamin B-12 (CYANOCOBALAMIN) 1000 MCG tablet Take 1,000 mcg by mouth daily.    [provider]    Allergies    Gabapentin, Cefadroxil, and  Oxycodone  Review of Systems   Review of Systems  Neurological: Positive for dizziness.  All other systems reviewed and are negative.   Physical Exam Updated Vital Signs BP 127/69   Pulse 85   Resp 17   SpO2 100%   Physical Exam Vitals and nursing note reviewed.  Constitutional:      General: She is not in acute distress.    Appearance: Normal appearance. She is well-developed.  HENT:     Head: Normocephalic and atraumatic.     Right Ear: Hearing normal.     Left Ear: Hearing normal.     Nose: Nose normal.  Eyes:     Conjunctiva/sclera: Conjunctivae normal.     Pupils: Pupils are equal, round, and reactive to light.  Cardiovascular:     Rate and Rhythm: Regular rhythm.     Heart sounds: S1 normal and S2 normal. No murmur heard. No friction rub. No gallop.   Pulmonary:     Effort: Pulmonary effort is normal. No respiratory distress.     Breath sounds: Normal breath sounds.  Chest:     Chest wall: No tenderness.  Abdominal:     General: Bowel sounds are normal.     Palpations: Abdomen is soft.     Tenderness: There is no abdominal tenderness. There is no guarding or rebound. Negative signs include Murphy's sign and McBurney's sign.     Hernia: No hernia is present.  Musculoskeletal:        General: Normal range of motion.     Cervical back: Normal range of motion and neck supple.  Skin:    General: Skin is warm and dry.     Findings: No rash.  Neurological:     Mental Status: She is alert and oriented to person, place, and time.     GCS: GCS eye subscore is 4. GCS verbal subscore is 5. GCS motor subscore is 6.     Cranial Nerves: No cranial nerve deficit.     Sensory: No sensory deficit.     Coordination: Coordination normal.     Comments: Extraocular muscle movement: normal No visual field cut Pupils: equal and reactive both direct and consensual response is normal No nystagmus present    Sensory function is intact to light touch, pinprick Proprioception  intact  Grip strength 5/5 symmetric in upper extremities  Lower extremity strength 5/5 against gravity     Psychiatric:  Speech: Speech normal.        Behavior: Behavior normal.        Thought Content: Thought content normal.     ED Results / Procedures / Treatments   Labs (all labs ordered are listed, but only abnormal results are displayed) Labs Reviewed  CBC WITH DIFFERENTIAL/PLATELET - Abnormal; Notable for the following components:      Result Value   WBC 12.2 (*)    RBC 3.68 (*)    Hemoglobin 10.8 (*)    Neutro Abs 8.8 (*)    Monocytes Absolute 1.3 (*)    All other components within normal limits  BASIC METABOLIC PANEL - Abnormal; Notable for the following components:   Potassium 3.4 (*)    CO2 33 (*)    Glucose, Bld 112 (*)    Calcium 8.5 (*)    All other components within normal limits  URINALYSIS, ROUTINE W REFLEX MICROSCOPIC - Abnormal; Notable for the following components:   Color, Urine STRAW (*)    Hgb urine dipstick MODERATE (*)    Leukocytes,Ua MODERATE (*)    Bacteria, UA RARE (*)    All other components within normal limits    EKG EKG Interpretation  Date/Time:  Friday Jun 17 2020 22:35:04 EDT Ventricular Rate:  84 PR Interval:  139 QRS Duration: 97 QT Interval:  363 QTC Calculation: 430 R Axis:   44 Text Interpretation: Sinus rhythm Borderline low voltage, extremity leads Confirmed by Gilda Crease [phone]) on 06/17/2020 11:08:58 PM   Radiology CT HEAD WO CONTRAST  Result Date: 06/17/2020 CLINICAL DATA:  Nonspecific dizziness. EXAM: CT HEAD WITHOUT CONTRAST TECHNIQUE: Contiguous axial images were obtained from the base of the skull through the vertex without intravenous contrast. COMPARISON:  Most recent head CT 05/25/2020 FINDINGS: Brain: Stable degree of atrophy and chronic small vessel ischemia. No intracranial hemorrhage, mass effect, or midline shift. No hydrocephalus. The basilar cisterns are patent. No evidence of  territorial infarct or acute ischemia. No extra-axial or intracranial fluid collection. Vascular: No hyperdense vessel. Skull: No fracture or focal lesion. Right parietal burr hole again seen. Sinuses/Orbits: Occasional opacification of ethmoid air cells. Mastoid air cells are clear. No mastoid effusion. Bilateral cataract resection. Other: None. IMPRESSION: 1. No acute intracranial abnormality. 2. Stable atrophy and chronic small vessel ischemia. Electronically Signed   By: Narda Rutherford M.D.   On: 06/17/2020 23:55    Procedures Procedures   Medications Ordered in ED Medications  fosfomycin (MONUROL) packet 3 g (has no administration in time range)  sodium chloride 0.9 % bolus 500 mL (has no administration in time range)    ED Course  I have reviewed the triage vital signs and the nursing notes.  Pertinent labs & imaging results that were available during my care of the patient were reviewed by me and considered in my medical decision making (see chart for details).    MDM Rules/Calculators/A&P                          Patient presents to the emergency department for evaluation of dizziness.  Patient reports that she got up from bed to go to the bathroom and the room was spinning around her.  The symptoms have resolved.  Neurologic exam is normal at arrival.  CT head without any acute pathology.  Lab work unremarkable other than abnormal urinalysis, possible UTI.  Treat with fosfomycin.  Patient with slight orthostatic heart rate changes.  Given a  fluid bolus.  Patient appropriate for discharge, follow-up with primary care.  Final Clinical Impression(s) / ED Diagnoses Final diagnoses:  Vertigo  Urinary tract infection without hematuria, site unspecified    Rx / DC Orders ED Discharge Orders    None       Lise Pincus, Canary Brim, MD 06/18/20 0109

## 2020-06-17 NOTE — ED Triage Notes (Signed)
Pt c/o of dizziness when she tried to get out of bed. Denies LOC

## 2020-06-18 ENCOUNTER — Emergency Department (HOSPITAL_COMMUNITY)
Admission: EM | Admit: 2020-06-18 | Discharge: 2020-06-20 | Disposition: A | Discharge status: Home / Self Care | Payer: Medicare Other | Source: Home / Self Care | Attending: Emergency Medicine | Admitting: Emergency Medicine

## 2020-06-18 ENCOUNTER — Other Ambulatory Visit: Payer: Self-pay

## 2020-06-18 ENCOUNTER — Encounter (HOSPITAL_COMMUNITY): Payer: Self-pay

## 2020-06-18 DIAGNOSIS — Z20822 Contact with and (suspected) exposure to covid-19: Secondary | ICD-10-CM | POA: Insufficient documentation

## 2020-06-18 DIAGNOSIS — Z79899 Other long term (current) drug therapy: Secondary | ICD-10-CM | POA: Insufficient documentation

## 2020-06-18 DIAGNOSIS — N183 Chronic kidney disease, stage 3 unspecified: Secondary | ICD-10-CM | POA: Insufficient documentation

## 2020-06-18 DIAGNOSIS — R55 Syncope and collapse: Secondary | ICD-10-CM

## 2020-06-18 DIAGNOSIS — R42 Dizziness and giddiness: Secondary | ICD-10-CM | POA: Diagnosis not present

## 2020-06-18 DIAGNOSIS — R0602 Shortness of breath: Secondary | ICD-10-CM | POA: Diagnosis not present

## 2020-06-18 DIAGNOSIS — R279 Unspecified lack of coordination: Secondary | ICD-10-CM | POA: Diagnosis not present

## 2020-06-18 DIAGNOSIS — I129 Hypertensive chronic kidney disease with stage 1 through stage 4 chronic kidney disease, or unspecified chronic kidney disease: Secondary | ICD-10-CM | POA: Insufficient documentation

## 2020-06-18 DIAGNOSIS — E039 Hypothyroidism, unspecified: Secondary | ICD-10-CM | POA: Insufficient documentation

## 2020-06-18 DIAGNOSIS — J449 Chronic obstructive pulmonary disease, unspecified: Secondary | ICD-10-CM | POA: Insufficient documentation

## 2020-06-18 DIAGNOSIS — I10 Essential (primary) hypertension: Secondary | ICD-10-CM | POA: Diagnosis not present

## 2020-06-18 DIAGNOSIS — Z743 Need for continuous supervision: Secondary | ICD-10-CM | POA: Diagnosis not present

## 2020-06-18 LAB — URINALYSIS, ROUTINE W REFLEX MICROSCOPIC
Bilirubin Urine: NEGATIVE
Glucose, UA: NEGATIVE mg/dL
Ketones, ur: NEGATIVE mg/dL
Nitrite: NEGATIVE
Protein, ur: NEGATIVE mg/dL
Specific Gravity, Urine: 1.005 (ref 1.005–1.030)
pH: 6 (ref 5.0–8.0)

## 2020-06-18 LAB — BASIC METABOLIC PANEL
Anion gap: 6 (ref 5–15)
Anion gap: 10 (ref 5–15)
BUN: 16 mg/dL (ref 8–23)
BUN: 14 mg/dL (ref 8–23)
CO2: 33 mmol/L — ABNORMAL HIGH (ref 22–32)
CO2: 30 mmol/L (ref 22–32)
Calcium: 8.5 mg/dL — ABNORMAL LOW (ref 8.9–10.3)
Calcium: 9.1 mg/dL (ref 8.9–10.3)
Chloride: 104 mmol/L (ref 98–111)
Chloride: 101 mmol/L (ref 98–111)
Creatinine, Ser: 0.88 mg/dL (ref 0.44–1.00)
Creatinine, Ser: 0.96 mg/dL (ref 0.44–1.00)
GFR, Estimated: >60 mL/min (ref >60)
GFR, Estimated: >60 mL/min (ref >60)
Glucose, Bld: 112 mg/dL — ABNORMAL HIGH (ref 70–99)
Glucose, Bld: 100 mg/dL — ABNORMAL HIGH (ref 70–99)
Potassium: 3.4 mmol/L — ABNORMAL LOW (ref 3.5–5.1)
Potassium: 3.6 mmol/L (ref 3.5–5.1)
Sodium: 143 mmol/L (ref 135–145)
Sodium: 141 mmol/L (ref 135–145)

## 2020-06-18 LAB — CBC
HCT: 42 % (ref 36.0–46.0)
Hemoglobin: 12.1 g/dL (ref 12.0–15.0)
MCH: 29.4 pg (ref 26.0–34.0)
MCHC: 28.8 g/dL — ABNORMAL LOW (ref 30.0–36.0)
MCV: 102.2 fL — ABNORMAL HIGH (ref 80.0–100.0)
Platelets: 215 10*3/uL (ref 150–400)
RBC: 4.11 MIL/uL (ref 3.87–5.11)
RDW: 13.9 % (ref 11.5–15.5)
WBC: 12 10*3/uL — ABNORMAL HIGH (ref 4.0–10.5)
nRBC: 0.2 % (ref 0.0–0.2)

## 2020-06-18 MED ORDER — SODIUM CHLORIDE 0.9 % IV BOLUS
500.0000 mL | Freq: Once | INTRAVENOUS | Status: AC
  Administered 2020-06-18: 500 mL via INTRAVENOUS

## 2020-06-18 MED ORDER — FOSFOMYCIN TROMETHAMINE 3 G PO PACK
3.0000 g | PACK | Freq: Once | ORAL | Status: AC
  Administered 2020-06-18: 3 g via ORAL
  Filled 2020-06-18: qty 3

## 2020-06-18 NOTE — ED Notes (Signed)
[facility] called  

## 2020-06-18 NOTE — ED Triage Notes (Addendum)
Pt bib [facility] from home. Pt was trying to get into bed and she passed out. Pt states she lost consciousness and fell down. Pt denies hitting head.  Pt denies taking blood thinners. Pt seem for similar symptoms last night. Pt feels weak and has pain all over.   EMS VS: 101/68 HR=90 02=98% on home 02, 2LNC,  FSBS=133

## 2020-06-18 NOTE — ED Provider Notes (Signed)
[facility] EMERGENCY DEPARTMENT Provider Note   CSN: [id number] Arrival date & time: 06/18/20  1943     History Chief Complaint  Patient presents with  . Loss of Consciousness    Lindsey Rowe is a 75 y.o. female.  HPI   75 year old female presents the emergency department after what appears to be a near syncopal event.  Patient is mainly wheelchair-bound on supplemental oxygen.  She states that she was at the foot of her bed, plug in a fan and was trying to walk back to bed when she felt herself "blackout" so she lowered herself down to her knees.  Did not hit her head, no full loss of consciousness, she states she returned to baseline once she was on her knees.  She crawled then to the door to ask for help.  Of note patient was here last night with similar complaints.  Work-up was reassuring and she was discharged.  Patient admits that she has been having looser than normal bowel movements but denies any acute abdominal pain, chest pain, fever, acute symptoms.  Past Medical History:  Diagnosis Date  . Arthritis   . Back pain   . Bipolar 1 disorder (HCC)   . Complication of anesthesia    hard to wake up anesthesia  . Depression   . GERD (gastroesophageal reflux disease)   . Headache(784.0)    migraines and tension headaches  . Hyperlipidemia   . Hypertension   . Hypothyroidism   . Thyroid disease     Patient Active Problem List   Diagnosis Date Noted  . Chronic kidney disease due to hypertension 01/22/2020  . Chronic kidney disease, stage 3 unspecified (HCC) 01/22/2020  . Fibromyalgia 01/22/2020  . Gout 01/22/2020  . Insomnia 01/22/2020  . 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 Osteoarthritis 01/22/2020  . Osteoporosis 01/22/2020  . Spinal stenosis 01/22/2020  . Thoracic aortic aneurysm without rupture (HCC) 01/22/2020  . Chronic pain of both knees 11/25/2019  . Subdural hematoma (HCC) 06/28/2019  . Kidney  disease 05/14/2019  . Hyperlipidemia 04/29/2019  . COPD exacerbation (HCC) 10/08/2018  . Asthmatic bronchitis 12/02/2017  . Closed fracture of right talus 07/31/2017  . Acute respiratory failure with hypoxia (HCC) 06/27/2017  . Diastolic dysfunction 06/27/2017  . Acute lower UTI 06/27/2017  . Acute metabolic encephalopathy 06/27/2017  . Altered mental state 06/10/2017  . Pain in lower limb 05/25/2017  . Dyspnea and respiratory abnormalities 08/29/2016  . Hypoxemia 08/29/2016  . Shoulder dislocation, right, initial encounter 05/24/2016  . 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 Hyperglycemia 05/24/2016  . Anterior dislocation of right shoulder   . Syncope 03/24/2016  . Generalized weakness 09/24/2015  . Leg pain, bilateral 09/24/2015  . Depression 09/24/2015  . Loss of appetite 09/24/2015  . Leg weakness, bilateral   . Hypothyroid 12/28/2012  . Hypovolemia 12/28/2012  . Hypotension 08/13/2012  . Dehydration 08/13/2012  . AKI (acute kidney injury) (HCC) 08/13/2012  . Hypoventilation associated with obesity syndrome (HCC) 08/13/2012  . Acute respiratory failure (HCC) 07/26/2012  . Chest pain 07/26/2012  . ANKLE SPRAIN, RIGHT 03/16/2010  . DIVERTICULOSIS, COLON 10/31/2009  . SKIN LESION 09/23/2009  . Shoulder pain, right 07/05/2009  . COUGH 03/29/2009  . Migraine 03/11/2009  . OTITIS MEDIA, RIGHT 12/13/2008  . BRUISE 12/06/2008  . ACID REFLUX DISEASE 09/13/2008  . ALLERGIC RHINITIS 05/20/2008  . UNSPECIFIED DISEASE OF HAIR AND HAIR FOLLICLES 05/20/2008  . DERMATITIS 11/12/2007  .  OBESITY 07/18/2007  . HYPERCHOLESTEROLEMIA 05/13/2007  . BIPOLAR AFFECTIVE DISORDER 05/12/2007  . HYPERTENSION, BENIGN ESSENTIAL 05/12/2007  . Low back pain potentially associated with radiculopathy 05/12/2007    Past Surgical History:  Procedure Laterality Date  . APPENDECTOMY    . BACK SURGERY    . BRAIN SURGERY  06/2019  . CRANIOTOMY Right 07/12/2019   Procedure: Guss Bunde for HEMATOMA  EVACUATION SUBDURAL;  Surgeon: Jadene Pierini, MD;  Location: [facility] OR;  Service: Neurosurgery;  Laterality: Right;  . EYE SURGERY Bilateral    lens implant  . GASTRIC BYPASS    . LOOP RECORDER INSERTION N/A 05/25/2016   Procedure: Loop Recorder Insertion;  Surgeon: Will Jorja Loa, MD;  Location: [facility] INVASIVE CV LAB;  Service: Cardiovascular;  Laterality: N/A;  . LUMBAR LAMINECTOMY/DECOMPRESSION MICRODISCECTOMY Right 07/23/2012   Procedure: LUMBAR LAMINECTOMY/DECOMPRESSION MICRODISCECTOMY 2 LEVELS;  Surgeon: Karn Cassis, MD;  Location: [facility] NEURO ORS;  Service: Neurosurgery;  Laterality: Right;  Right Lumbar three-four  lumbar four-five Laminectomy/Foraminotomy  . NASAL SINUS SURGERY Bilateral   . TONSILLECTOMY    . WRIST SURGERY       OB History   No obstetric history on file.     Family History  Problem Relation Age of Onset  . Heart disease Mother   . Emphysema Father   . Diabetes Sister   . Other Brother        Brain tumor - s/p excision    Social History   Tobacco Use  . Smoking status: Never Smoker  . Smokeless tobacco: Never Used  Vaping Use  . Vaping Use: Never used  Substance Use Topics  . Alcohol use: Not Currently    Comment: social  . Drug use: No    Comment: Pt + opioids as prescribed    Home Medications Prior to Admission medications   Medication Sig Start Date End Date Taking? Authorizing Provider  acetaminophen (TYLENOL) 325 MG tablet Take 2 tablets (650 mg total) by mouth every 6 (six) hours as needed for mild pain, moderate pain or headache. Patient not taking: No sig reported 05/02/20   Shaune Pollack, MD  acetaminophen (TYLENOL) 500 MG tablet Take 1,000 mg by mouth every 6 (six) hours as needed for moderate pain.    [provider]  albuterol (PROVENTIL HFA;VENTOLIN HFA) 108 (90 Base) MCG/ACT inhaler Inhale 2 puffs into the lungs every 6 (six) hours as needed for wheezing or shortness of breath. 04/07/18   Charlynne Pander, MD   busPIRone (BUSPAR) 10 MG tablet Take 10 mg by mouth 2 (two) times daily. 05/01/19   [provider]  cholecalciferol (VITAMIN D3) 25 MCG (1000 UNIT) tablet Take 1,000 Units by mouth daily.    [provider]  cycloSPORINE (RESTASIS) 0.05 % ophthalmic emulsion Place 1 drop into both eyes 2 (two) times daily.    [provider]  diclofenac Sodium (VOLTAREN) 1 % GEL Apply 2 g topically 4 (four) times daily.    [provider]  DULoxetine (CYMBALTA) 30 MG capsule Take 1 capsule (30 mg total) by mouth 2 (two) times daily. 05/26/16   Pearson Grippe, MD  fluticasone (FLONASE) 50 MCG/ACT nasal spray Place 1 spray into both nostrils daily as needed for allergies or rhinitis. Patient not taking: No sig reported 06/29/17   Rai, Delene Ruffini, MD  levothyroxine (SYNTHROID, LEVOTHROID) 75 MCG tablet Take 75 mcg by mouth daily before breakfast.  09/06/15   [provider]  lidocaine (LIDODERM) 5 % Place 1  patch onto the skin daily. Remove & Discard patch within 12 hours or as directed by MD 05/24/20   Ameah Chanda, Clabe SealKristie M, DO  melatonin 3 MG TABS tablet Take 3 mg by mouth at bedtime.    [provider]  Menthol, Topical Analgesic, (BIOFREEZE) 4 % GEL Apply 1 application topically 2 (two) times daily as needed (back pain). Apply to both legs    [provider]  neomycin-polymyxin b-dexamethasone (MAXITROL) 3.5-10000-0.1 SUSP Place 1 drop into the left eye in the morning and at bedtime. 04/02/20   [provider]  omeprazole (PRILOSEC) 20 MG capsule Take 20 mg by mouth 2 (two) times daily before a meal.    [provider]  Polyethyl Glycol-Propyl Glycol (SYSTANE) 0.4-0.3 % GEL ophthalmic gel Place 2 application into both eyes 3 (three) times daily.    [provider]  pravastatin (PRAVACHOL) 40 MG tablet Take 40 mg by mouth every evening.     [provider]  pregabalin (LYRICA) 75 MG capsule Take 75 mg by mouth 2 (two) times daily.  01/13/20   [provider]  Ubrogepant (UBRELVY) 50 MG TABS Take 50 mg by mouth daily as needed (migraine).    [provider]  vitamin B-12 (CYANOCOBALAMIN) 1000 MCG tablet Take 1,000 mcg by mouth daily.    [provider]    Allergies    Gabapentin, Cefadroxil, and Oxycodone  Review of Systems   Review of Systems  Constitutional: Positive for fatigue. Negative for chills and fever.  HENT: Negative for congestion.   Eyes: Negative for visual disturbance.  Respiratory: Negative for shortness of breath.   Cardiovascular: Negative for chest pain.  Gastrointestinal: Positive for diarrhea. Negative for abdominal pain and vomiting.  Genitourinary: Negative for dysuria.  Skin: Negative for rash.  Neurological: Negative for dizziness and headaches.    Physical Exam Updated Vital Signs BP 123/74   Pulse 81   Temp 98.3 F (36.8 C) (Oral)   Resp 18   SpO2 100%   Physical Exam Vitals and nursing note reviewed.  Constitutional:      Appearance: Normal appearance.  HENT:     Head: Normocephalic.     Mouth/Throat:     Mouth: Mucous membranes are moist.  Eyes:     Extraocular Movements: Extraocular movements intact.     Pupils: Pupils are equal, round, and reactive to light.  Cardiovascular:     Rate and Rhythm: Normal rate.  Pulmonary:     Effort: Pulmonary effort is normal. No respiratory distress.  Abdominal:     Palpations: Abdomen is soft.     Tenderness: There is no abdominal tenderness.  Musculoskeletal:     Comments: Chronically obese extremities  Skin:    General: Skin is warm.  Neurological:     General: No focal deficit present.     Mental Status: She is alert and oriented to person, place, and time. Mental status is at baseline.  Psychiatric:        Mood and Affect: Mood normal.     ED Results / Procedures / Treatments   Labs (all labs ordered are listed, but only abnormal results are displayed) Labs Reviewed  BASIC METABOLIC  PANEL - Abnormal; Notable for the following components:      Result Value   Glucose, Bld 100 (*)    All other components within normal limits  CBC - Abnormal; Notable for the following components:   WBC 12.0 (*)    MCV 102.2 (*)  MCHC 28.8 (*)    All other components within normal limits  URINALYSIS, ROUTINE W REFLEX MICROSCOPIC  CBG MONITORING, ED    EKG EKG Interpretation  Date/Time:  Saturday Jun 18 2020 20:01:02 EDT Ventricular Rate:  83 PR Interval:  122 QRS Duration: 90 QT Interval:  356 QTC Calculation: 418 R Axis:   22 Text Interpretation: Sinus rhythm with Premature atrial complexes Otherwise normal ECG NSR, no change from previous Confirmed by Coralee Pesa [phone]) on 06/18/2020 10:21:55 PM   Radiology CT HEAD WO CONTRAST  Result Date: 06/17/2020 CLINICAL DATA:  Nonspecific dizziness. EXAM: CT HEAD WITHOUT CONTRAST TECHNIQUE: Contiguous axial images were obtained from the base of the skull through the vertex without intravenous contrast. COMPARISON:  Most recent head CT 05/25/2020 FINDINGS: Brain: Stable degree of atrophy and chronic small vessel ischemia. No intracranial hemorrhage, mass effect, or midline shift. No hydrocephalus. The basilar cisterns are patent. No evidence of territorial infarct or acute ischemia. No extra-axial or intracranial fluid collection. Vascular: No hyperdense vessel. Skull: No fracture or focal lesion. Right parietal burr hole again seen. Sinuses/Orbits: Occasional opacification of ethmoid air cells. Mastoid air cells are clear. No mastoid effusion. Bilateral cataract resection. Other: None. IMPRESSION: 1. No acute intracranial abnormality. 2. Stable atrophy and chronic small vessel ischemia. Electronically Signed   By: Narda Rutherford M.D.   On: 06/17/2020 23:55    Procedures Procedures   Medications Ordered in ED Medications  sodium chloride 0.9 % bolus 500 mL (500 mLs Intravenous New Bag/Given 06/18/20 2318)    ED Course  I have  reviewed the triage vital signs and the nursing notes.  Pertinent labs & imaging results that were available during my care of the patient were reviewed by me and considered in my medical decision making (see chart for details).    MDM Rules/Calculators/A&P                          75 year old female presents the emergency department after an episode of blacking out try to get in bed.  Of note patient was seen here last night with a similar complaint.  She states last night she had an episode of dizziness that caused her to come be evaluated.  She had blood work and a head CT that was normal.  Was found to be borderline orthostatic, hydrated and was safe for discharge.  Tonight patient denies any associated dizziness, chest pain or shortness of breath.  She believes that she overexerted herself trying to walk back to bed as she is mostly wheelchair-bound. No full syncope of LOC. Patient admits to loose stools but denies any other acute illness symptoms.  EKG is unchanged for the patient.  I do not appreciate a cardiac aspect of the syncope.  She has no headache or neuro symptoms to suggest neuro imaging. She becomes tachycardic with standing, borderline orthostatic again. Could be from diarrhea, will hydrate.  After speaking with the patient she states she does not feel safe going home, she states she is unable to remove her her wheelchair safely at home.  She is interested in possible nursing home placement.  She believes the episodes that she is experienced is from being too weak and overexerting herself.  Blood work today is comparable to yesterday, we are pending urinalysis.  Plan for PT and social work consult in the morning.  Final Clinical Impression(s) / ED Diagnoses Final diagnoses:  None    Rx / DC Orders  ED Discharge Orders    None       Rozelle Logan, DO 06/18/20 2333

## 2020-06-18 NOTE — ED Notes (Signed)
Gave Web designer for [employer]

## 2020-06-18 NOTE — ED Notes (Signed)
Pt 1-2 assist to stand.

## 2020-06-19 LAB — URINALYSIS, ROUTINE W REFLEX MICROSCOPIC
Bilirubin Urine: NEGATIVE
Glucose, UA: NEGATIVE mg/dL
Ketones, ur: NEGATIVE mg/dL
Nitrite: NEGATIVE
Protein, ur: NEGATIVE mg/dL
Specific Gravity, Urine: 1.012 (ref 1.005–1.030)
pH: 6 (ref 5.0–8.0)

## 2020-06-19 LAB — CBG MONITORING, ED: Glucose-Capillary: 103 mg/dL — ABNORMAL HIGH (ref 70–99)

## 2020-06-19 MED ORDER — DULOXETINE HCL 30 MG PO CPEP
30.0000 mg | ORAL_CAPSULE | Freq: Two times a day (BID) | ORAL | Status: DC
  Administered 2020-06-19 – 2020-06-20 (×2): 30 mg via ORAL
  Filled 2020-06-19 (×2): qty 1

## 2020-06-19 MED ORDER — SUMATRIPTAN SUCCINATE 50 MG PO TABS
50.0000 mg | ORAL_TABLET | Freq: Every day | ORAL | Status: DC | PRN
  Filled 2020-06-19: qty 1

## 2020-06-19 MED ORDER — BUSPIRONE HCL 10 MG PO TABS
30.0000 mg | ORAL_TABLET | Freq: Three times a day (TID) | ORAL | Status: DC
  Administered 2020-06-19 – 2020-06-20 (×2): 30 mg via ORAL
  Filled 2020-06-19 (×2): qty 3

## 2020-06-19 MED ORDER — LEVOTHYROXINE SODIUM 75 MCG PO TABS: 75.0000 ug | ORAL_TABLET | Freq: Every day | ORAL | Status: DC

## 2020-06-19 MED ORDER — ACETAMINOPHEN 325 MG PO TABS
650.0000 mg | ORAL_TABLET | Freq: Four times a day (QID) | ORAL | Status: DC | PRN
  Administered 2020-06-19 – 2020-06-20 (×2): 650 mg via ORAL
  Filled 2020-06-19 (×2): qty 2

## 2020-06-19 MED ORDER — LEVOTHYROXINE SODIUM 75 MCG PO TABS
75.0000 ug | ORAL_TABLET | Freq: Every day | ORAL | Status: DC
  Administered 2020-06-20: 75 ug via ORAL
  Filled 2020-06-19: qty 1

## 2020-06-19 MED ORDER — PREGABALIN 100 MG PO CAPS
100.0000 mg | ORAL_CAPSULE | Freq: Two times a day (BID) | ORAL | Status: DC
  Administered 2020-06-19 – 2020-06-20 (×2): 100 mg via ORAL
  Filled 2020-06-19 (×2): qty 1

## 2020-06-19 MED ORDER — LORAZEPAM 1 MG PO TABS
1.0000 mg | ORAL_TABLET | Freq: Two times a day (BID) | ORAL | Status: DC | PRN
  Administered 2020-06-19: 1 mg via ORAL
  Filled 2020-06-19: qty 1

## 2020-06-19 MED ORDER — PRAVASTATIN SODIUM 40 MG PO TABS
40.0000 mg | ORAL_TABLET | Freq: Every evening | ORAL | Status: DC
  Administered 2020-06-19: 40 mg via ORAL
  Filled 2020-06-19: qty 1

## 2020-06-19 NOTE — ED Notes (Signed)
Hospital bed ordered.

## 2020-06-19 NOTE — ED Provider Notes (Signed)
Notified by physical therapist that she had reported not wanting to live. Denies SI, reports loneliness, acknowledges depression. Under evaluation for placement in SNF with SW.    Alvira Monday, MD 06/19/20 1017

## 2020-06-19 NOTE — [place] FL2 (Signed)
[place] MEDICAID FL2 LEVEL OF CARE SCREENING TOOL     IDENTIFICATION  Patient Name: Lindsey Rowe Birthdate: 1945-11-03 Sex: female Admission Date (Current Location): 06/18/2020  [facility] and [place] Number:  Producer, television/film/video and Address:  [facility], [address] [place], [place] [address]      Provider Number: [id number]  Attending Physician Name and Address:  Default, Provider, MD  Relative Name and Phone Number:  Murriel Hopper [phone]    Current Level of Care: Other (Comment) (emergency department) Recommended Level of Care: Skilled Nursing Facility Prior Approval Number:    Date Approved/Denied:   PASRR Number: [id number]  Discharge Plan: SNF    Current Diagnoses: Patient Active Problem List   Diagnosis Date Noted  . Chronic kidney disease due to hypertension 01/22/2020  . Chronic kidney disease, stage 3 unspecified (HCC) 01/22/2020  . Fibromyalgia 01/22/2020  . Gout 01/22/2020  . Insomnia 01/22/2020  . 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 Osteoarthritis 01/22/2020  . Osteoporosis 01/22/2020  . Spinal stenosis 01/22/2020  . Thoracic aortic aneurysm without rupture (HCC) 01/22/2020  . Chronic pain of both knees 11/25/2019  . Subdural hematoma (HCC) 06/28/2019  . Kidney disease 05/14/2019  . Hyperlipidemia 04/29/2019  . COPD exacerbation (HCC) 10/08/2018  . Asthmatic bronchitis 12/02/2017  . Closed fracture of right talus 07/31/2017  . Acute respiratory failure with hypoxia (HCC) 06/27/2017  . Diastolic dysfunction 06/27/2017  . Acute lower UTI 06/27/2017  . Acute metabolic encephalopathy 06/27/2017  . Altered mental state 06/10/2017  . Pain in lower limb 05/25/2017  . Dyspnea and respiratory abnormalities 08/29/2016  . Hypoxemia 08/29/2016  . Shoulder dislocation, right, initial encounter 05/24/2016  . 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 Hyperglycemia  05/24/2016  . Anterior dislocation of right shoulder   . Syncope 03/24/2016  . Generalized weakness 09/24/2015  . Leg pain, bilateral 09/24/2015  . Depression 09/24/2015  . Loss of appetite 09/24/2015  . Leg weakness, bilateral   . Hypothyroid 12/28/2012  . Hypovolemia 12/28/2012  . Hypotension 08/13/2012  . Dehydration 08/13/2012  . AKI (acute kidney injury) (HCC) 08/13/2012  . Hypoventilation associated with obesity syndrome (HCC) 08/13/2012  . Acute respiratory failure (HCC) 07/26/2012  . Chest pain 07/26/2012  . ANKLE SPRAIN, RIGHT 03/16/2010  . DIVERTICULOSIS, COLON 10/31/2009  . SKIN LESION 09/23/2009  . Shoulder pain, right 07/05/2009  . COUGH 03/29/2009  . Migraine 03/11/2009  . OTITIS MEDIA, RIGHT 12/13/2008  . BRUISE 12/06/2008  . ACID REFLUX DISEASE 09/13/2008  . ALLERGIC RHINITIS 05/20/2008  . UNSPECIFIED DISEASE OF HAIR AND HAIR FOLLICLES 05/20/2008  . DERMATITIS 11/12/2007  . OBESITY 07/18/2007  . HYPERCHOLESTEROLEMIA 05/13/2007  . BIPOLAR AFFECTIVE DISORDER 05/12/2007  . HYPERTENSION, BENIGN ESSENTIAL 05/12/2007  . Low back pain potentially associated with radiculopathy 05/12/2007    Orientation RESPIRATION BLADDER Height & Weight     Self,Time,Situation,Place  O2 (2L) Continent Weight:   Height:     BEHAVIORAL SYMPTOMS/MOOD NEUROLOGICAL BOWEL NUTRITION STATUS      Continent Diet  AMBULATORY STATUS COMMUNICATION OF NEEDS Skin   Extensive Assist Verbally Normal                       Personal Care Assistance Level of Assistance  Bathing,Feeding,Dressing Bathing Assistance: Limited assistance Feeding assistance: Independent Dressing Assistance: Limited assistance     Functional Limitations Info  Sight,Hearing,Speech Sight Info: Adequate Hearing Info: Adequate Speech Info: Adequate  SPECIAL CARE FACTORS FREQUENCY  PT (By licensed PT),OT (By licensed OT)     PT Frequency: 5x weekly OT Frequency: 5x weekly            Contractures  Contractures Info: Not present    Additional Factors Info  Code Status,Allergies Code Status Info: DNR Allergies Info: gabapentin, oxycodone, cefadroxil           Current Medications (06/19/2020):  This is the current hospital active medication list No current facility-administered medications for this encounter.   Current Outpatient Medications  Medication Sig Dispense Refill  . acetaminophen (TYLENOL) 500 MG tablet Take 1,000 mg by mouth every 6 (six) hours as needed for moderate pain.    [name] albuterol (PROVENTIL HFA;VENTOLIN HFA) 108 (90 Base) MCG/ACT inhaler Inhale 2 puffs into the lungs every 6 (six) hours as needed for wheezing or shortness of breath. 1 Inhaler 0  . busPIRone (BUSPAR) 30 MG tablet Take 30 mg by mouth 3 (three) times daily.    . cholecalciferol (VITAMIN D3) 25 MCG (1000 UNIT) tablet Take 1,000 Units by mouth daily.    . cycloSPORINE (RESTASIS) 0.05 % ophthalmic emulsion Place 1 drop into both eyes 2 (two) times daily.    [name] denosumab (PROLIA) 60 MG/ML SOSY injection Inject 60 mg into the skin See admin instructions. Every 6 months    . diclofenac Sodium (VOLTAREN) 1 % GEL Apply 2 g topically 4 (four) times daily as needed (pain).    . DULoxetine (CYMBALTA) 30 MG capsule Take 1 capsule (30 mg total) by mouth 2 (two) times daily. 60 capsule 0  . levothyroxine (SYNTHROID, LEVOTHROID) 75 MCG tablet Take 75 mcg by mouth daily before breakfast.     . LORazepam (ATIVAN) 1 MG tablet Take 1 mg by mouth 2 (two) times daily as needed for anxiety.    . melatonin 3 MG TABS tablet Take 3 mg by mouth at bedtime.    [name] omeprazole (PRILOSEC) 20 MG capsule Take 20 mg by mouth 2 (two) times daily before a meal.    . Polyethyl Glycol-Propyl Glycol (SYSTANE) 0.4-0.3 % GEL ophthalmic gel Place 2 application into both eyes 3 (three) times daily as needed (dry eyes).    . pravastatin (PRAVACHOL) 40 MG tablet Take 40 mg by mouth every evening.     . pregabalin (LYRICA) 100 MG capsule Take 100  mg by mouth 2 (two) times daily.    . SUMAtriptan (IMITREX) 50 MG tablet Take 50 mg by mouth daily as needed for migraine.    . triamcinolone cream (KENALOG) 0.1 % Apply 1 application topically daily.    . vitamin B-12 (CYANOCOBALAMIN) 1000 MCG tablet Take 1,000 mcg by mouth daily.    [name] acetaminophen (TYLENOL) 325 MG tablet Take 2 tablets (650 mg total) by mouth every 6 (six) hours as needed for mild pain, moderate pain or headache. (Patient not taking: No sig reported) 30 tablet 0  . fluticasone (FLONASE) 50 MCG/ACT nasal spray Place 1 spray into both nostrils daily as needed for allergies or rhinitis. (Patient not taking: No sig reported) 16 g 2  . lidocaine (LIDODERM) 5 % Place 1 patch onto the skin daily. Remove & Discard patch within 12 hours or as directed by MD (Patient not taking: Reported on 06/19/2020) 30 patch 0  . Ubrogepant (UBRELVY) 50 MG TABS Take 50 mg by mouth daily as needed (migraine). (Patient not taking: Reported on 06/19/2020)       Discharge Medications: Please see discharge summary for  a list of discharge medications.  Relevant Imaging Results:  Relevant Lab Results:   Additional Information SS# [id number]/ Pt has had 2 Covid shots plus booster  Annalee Genta, LCSW

## 2020-06-19 NOTE — ED Notes (Signed)
pt  is waiting for placement in a SNF or rehab. She called out to me to let me know that she is concerned that her apartment "is a mess". She stated "I cannot be placed anywhere because my apartment is a mess and my landlord is going to kick me out. I need to get my clothes out before I go somewhere and I get kicked out of my apartment." Pt is unable to take care of herself at this time and cannot be discharged to apartment to take care of things and reports that she does not have any family nearby to help. SW informed

## 2020-06-19 NOTE — TOC Initial Note (Signed)
Transition of Care [facility]) - Initial/Assessment Note    Patient Details  Name: Lindsey Rowe MRN: [id number] Date of Birth: 19-Jun-1945  Transition of Care [facility]) CM/SW Contact:    Annalee Genta, LCSW Phone Number: 06/19/2020, 9:47 AM  Clinical Narrative:                 CSW notes PT recommendation for SNF and verbal consent from patient to look for SNF. Patient identifies long-term goals for ALF. CSW is concerned with multiple recent SNF stays and attaining insurance authorization for such. Otherwise, CSW will begin SNF referral process.   Expected Discharge Plan: Skilled Nursing Facility Barriers to Discharge: SNF Pending bed offer   Patient Goals and CMS Choice   CMS Medicare.gov Compare Post Acute Care list provided to:: Patient Choice offered to / list presented to : Patient  Expected Discharge Plan and Services Expected Discharge Plan: Skilled Nursing Facility     Post Acute Care Choice: Skilled Nursing Facility Living arrangements for the past 2 months: Single Family Home                                      Prior Living Arrangements/Services Living arrangements for the past 2 months: Single Family Home Lives with:: Self Patient language and need for interpreter reviewed:: Yes Do you feel safe going back to the place where you live?: No      Need for Family Participation in Patient Care: No (Comment) Care giver support system in place?: No (comment)   Criminal Activity/Legal Involvement Pertinent to Current Situation/Hospitalization: No - Comment as needed  Activities of Daily Living      Permission Sought/Granted Permission sought to share information with : Other (comment) Permission granted to share information with : Yes, Verbal Permission Granted              Emotional Assessment       Orientation: : Oriented to Self,Oriented to Place,Oriented to  Time,Oriented to Situation Alcohol / Substance Use: Not Applicable Psych Involvement: No  (comment)  Admission diagnosis:  fall gen wweakness and syncope Patient Active Problem List   Diagnosis Date Noted  . Chronic kidney disease due to hypertension 01/22/2020  . Chronic kidney disease, stage 3 unspecified (HCC) 01/22/2020  . Fibromyalgia 01/22/2020  . Gout 01/22/2020  . Insomnia 01/22/2020  . 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 Osteoarthritis 01/22/2020  . Osteoporosis 01/22/2020  . Spinal stenosis 01/22/2020  . Thoracic aortic aneurysm without rupture (HCC) 01/22/2020  . Chronic pain of both knees 11/25/2019  . Subdural hematoma (HCC) 06/28/2019  . Kidney disease 05/14/2019  . Hyperlipidemia 04/29/2019  . COPD exacerbation (HCC) 10/08/2018  . Asthmatic bronchitis 12/02/2017  . Closed fracture of right talus 07/31/2017  . Acute respiratory failure with hypoxia (HCC) 06/27/2017  . Diastolic dysfunction 06/27/2017  . Acute lower UTI 06/27/2017  . Acute metabolic encephalopathy 06/27/2017  . Altered mental state 06/10/2017  . Pain in lower limb 05/25/2017  . Dyspnea and respiratory abnormalities 08/29/2016  . Hypoxemia 08/29/2016  . Shoulder dislocation, right, initial encounter 05/24/2016  . 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 Hyperglycemia 05/24/2016  . Anterior dislocation of right shoulder   . Syncope 03/24/2016  . Generalized weakness 09/24/2015  . Leg pain, bilateral 09/24/2015  . Depression 09/24/2015  . Loss of appetite 09/24/2015  . Leg weakness, bilateral   . Hypothyroid  12/28/2012  . Hypovolemia 12/28/2012  . Hypotension 08/13/2012  . Dehydration 08/13/2012  . AKI (acute kidney injury) (HCC) 08/13/2012  . Hypoventilation associated with obesity syndrome (HCC) 08/13/2012  . Acute respiratory failure (HCC) 07/26/2012  . Chest pain 07/26/2012  . ANKLE SPRAIN, RIGHT 03/16/2010  . DIVERTICULOSIS, COLON 10/31/2009  . SKIN LESION 09/23/2009  . Shoulder pain, right 07/05/2009  . COUGH 03/29/2009  .  Migraine 03/11/2009  . OTITIS MEDIA, RIGHT 12/13/2008  . BRUISE 12/06/2008  . ACID REFLUX DISEASE 09/13/2008  . ALLERGIC RHINITIS 05/20/2008  . UNSPECIFIED DISEASE OF HAIR AND HAIR FOLLICLES 05/20/2008  . DERMATITIS 11/12/2007  . OBESITY 07/18/2007  . HYPERCHOLESTEROLEMIA 05/13/2007  . BIPOLAR AFFECTIVE DISORDER 05/12/2007  . HYPERTENSION, BENIGN ESSENTIAL 05/12/2007  . Low back pain potentially associated with radiculopathy 05/12/2007   PCP:  Maurice Small, MD Pharmacy:   [facility] [phone] - [place], [place] - [address] AT [facility] [address] [facility] [address] [place] [address] Phone: [phone] Fax: [phone]  [facility] - [place], [place] - [address] [place] [address] Phone: [phone] Fax: [phone]     Social Determinants of Health (SDOH) Interventions    Readmission Risk Interventions No flowsheet data found.

## 2020-06-19 NOTE — ED Notes (Signed)
Pt is very tearful, calling out that no one is here for her and she is scared. This RN at bedside to speak with pt and reassured pt that she is not alone and staff are here to monitor and assist her as needed. Pt still tearful and inconsolable, states she feels alone because she has no family to care for her. This RN noticed pt has not received scheduled home medication and spoke with provider. Orders placed to continue medication regimen. Pt notified of same.

## 2020-06-19 NOTE — ED Notes (Signed)
Pt taken to shower room and assisted with ADLs. Tolerated activities well but expressed numerous concerns throughout procedure. Returned to room and placed on hospital bed by this RN. Bed locked in lowest position and call light placed within reach.

## 2020-06-19 NOTE — Evaluation (Signed)
Physical Therapy Evaluation Patient Details Name: Lindsey Rowe MRN: [id number] DOB: 1945-11-19 Today's Date: 06/19/2020   History of Present Illness  75 year old female presents the emergency department after what appears to be a near syncopal event.  Patient mainly uses a wheelchair for mobilty, and is on supplemental oxygen.  She states that she was at the foot of her bed, plug in a fan and was trying to walk back to bed when she felt herself "blackout" so she lowered herself down to her knees.  Did not hit her head, no full loss of consciousness, she states she returned to baseline once she was on her knees.  She crawled then to the door to ask for help.  Of note patient was in the ED the previous night with similar complaints.  Work-up was reassuring and she was discharged.  Patient reports that she has been having looser than normal bowel movements but denies any acute abdominal pain, chest pain, fever, acute symptoms.  Clinical Impression   Pt admitted with above diagnosis. Lives at home alone in an apartment with a level entry;  Uses a wheelchair for mobility at baseline, and orders groceries from nearby [employer]; Presents to PT with generalized weakness, decr activity tolerance, and functional dependencies for mobility and ADLs; Current home situation seems unmanageable for [name];   It sounds like no family visits her; she orders groceries from [place] delivery; tells me she has a full freezer, hasn't ordered groceries in a long time, and has been eating freezer meals; also tells me she hasn't eaten anything in 3 days leading up to this ED visit; Mentioned being lonely more thatn 3 times during session, and stated perhaps things would be better if she weren't alive; Discussed with Zoe, RN, and informed Dr. Dalene Seltzer via secure Chat; Took extra time with pt interview to get a better picture of her living situation, and emotional state to get a fuller picture of her for dc planning; she did  describe Home Health Services (through Hospice, I beleive) sent some volunteers who "threw everything out" in her apartment -- which makes one wonder if her apartment is difficult to live in.  Pt currently with functional limitations due to the deficits listed below (see PT Problem List). Pt will benefit from skilled PT to increase their independence and safety with mobility to allow discharge to the venue listed below.       Follow Up Recommendations SNF;Other (comment) (for short-term rehab to bridge to assisted living situation)    Equipment Recommendations  None recommended by PT (seems pretty well-equipped)    Recommendations for Other Services Other (comment) (Will consider OT consult -- if able to go straight to SNF, can defer OT to SNF)     Precautions / Restrictions Precautions Precautions: Fall      Mobility  Bed Mobility Overal bed mobility: Needs Assistance Bed Mobility: Supine to Sit;Sit to Supine     Supine to sit: Min guard Sit to supine: Min assist   General bed mobility comments: Close guard for safety coming to sit on edge of recliner; Needed min assist to help her legs onto recliner to lie down    Transfers Overall transfer level: Needs assistance Equipment used: Rolling walker (2 wheeled) Transfers: Sit to/from Stand Sit to Stand: Max assist         General transfer comment: Pt initially stating she felt "funny" and requested to not attempt standing; Reassured her that we will stay close to the stretcher in  case she needs to sit; Max assist to stand from edge of strethcer; Slow rise, and heavy dependence on UE support; Pt reporting BLE feeling weak. Pt unable to tolerate standing for prolonged period (unable to get a truly standing BP), so further mobility limited.  Ambulation/Gait                Stairs            Wheelchair Mobility    Modified Rankin (Stroke Patients Only)       Balance     Sitting balance-Leahy Scale: Fair        Standing balance-Leahy Scale: Poor                               Pertinent Vitals/Pain Pain Assessment: Faces Faces Pain Scale: No hurt Pain Intervention(s): Monitored during session    Home Living Family/patient expects to be discharged to:: Private residence Living Arrangements: Alone Available Help at Discharge: Other (Comment) (Pt did not offer up any notion that she has someone to assist her at all at home; she mentioned she has one cousin that she speaks with on the phone a few times a week) Type of Home: Apartment Home Access: Level entry     Home Layout: One level Home Equipment: Wheelchair - manual;Shower seat Additional Comments: Reported she is worried about her ability to manage at home    Prior Function Level of Independence: Needs assistance   Gait / Transfers Assistance Needed: Pt reports she mainly sat in her wheelchair and propelled with her feet (and sometimes arms) around her home  ADL's / Homemaking Assistance Needed: Tells me she can get her wheelchair into the bathroom  and transfer wheelchair over side of tub and into her tub seat  Comments: It sounds like no family visits her; she orders groceries from [place] delivery; tells me she has a full freezer, hasn't ordered groceries in a long time, and has been eating freezer meals; alos tells me she hasn't eaten anything in 3 days leading up to this ED visit; Mentioned being lonely more thatn 3 times during session     Hand Dominance   Dominant Hand: Right    Extremity/Trunk Assessment   Upper Extremity Assessment Upper Extremity Assessment: Generalized weakness    Lower Extremity Assessment Lower Extremity Assessment: Generalized weakness       Communication   Communication: No difficulties;Other (comment)  Cognition Arousal/Alertness: Awake/alert Behavior During Therapy: WFL for tasks assessed/performed Overall Cognitive Status: No family/caregiver present to determine baseline  cognitive functioning                                 General Comments: Alert and oriented x4; Able to participate in discussion re: planning for transition from hospital and consider long-term living situation      General Comments General comments (skin integrity, edema, etc.): Took extra time with pt interview to get a better picture of her living situation, and emotional state to get a fuller picture of her for dc planning; she did describe Home Health Services (through Hospice, I beleive) sent some volunteers who "threw everything out" in her apartment -- which makes one wonder if her apartment is difficult to live in.    Exercises     Assessment/Plan    PT Assessment Patient needs continued PT services  PT Problem List Decreased strength;Decreased mobility;Decreased  balance;Decreased activity tolerance;Decreased knowledge of use of DME;Decreased knowledge of precautions       PT Treatment Interventions DME instruction;Gait training;Therapeutic activities;Functional mobility training;Balance training;Therapeutic exercise;Patient/family education    PT Goals (Current goals can be found in the Care Plan section)  Acute Rehab PT Goals Patient Stated Goal: Wants to go to SNF; is quite lonely at home PT Goal Formulation: With patient Time For Goal Achievement: 07/03/20 Potential to Achieve Goals: Good    Frequency Min 3X/week   Barriers to discharge Decreased caregiver support ALF will be optimal for her long-term -- ALF can also take a lto of time to setup    Co-evaluation               AM-PAC PT "6 Clicks" Mobility  Outcome Measure Help needed turning from your back to your side while in a flat bed without using bedrails?: None Help needed moving from lying on your back to sitting on the side of a flat bed without using bedrails?: None Help needed moving to and from a bed to a chair (including a wheelchair)?: A Lot Help needed standing up from a chair  using your arms (e.g., wheelchair or bedside chair)?: A Lot Help needed to walk in hospital room?: Total Help needed climbing 3-5 steps with a railing? : Total 6 Click Score: 14    End of Session Equipment Utilized During Treatment: Gait belt;Oxygen (typically on 2 L supplemental O2 at home) Activity Tolerance: Other (comment) (There seems to be something more than just physical functioning and weakness that is limiting her activity tolerance) Patient left: with call bell/phone within reach;Other (comment) (On stretcher) Nurse Communication: Mobility status;Other (comment) (Pt asking to turn supplemental O2 to 2L) PT Visit Diagnosis: Unsteadiness on feet (R26.81);Muscle weakness (generalized) (M62.81)    Time: 5726-2035 PT Time Calculation (min) (ACUTE ONLY): 38 min   Charges:   PT Evaluation $PT Eval Moderate Complexity: 1 Mod PT Treatments $Therapeutic Activity: 23-37 mins        Van Clines, PT  Acute Rehabilitation Services Pager [phone] Office [phone]   Levi Aland 06/19/2020, 9:54 AM

## 2020-06-19 NOTE — ED Notes (Signed)
Pt is very tearful and states that she is all alone, appears to be very depressed. Pt also concerned that she will be kicked out of her apartment on Tuesday and will not be able to collect her belongings before she is evicted. Pt has her brother's ashes in the apartment and does not want to lose them. She states "I don't care about anything else in that apartment but a box that has my brother's ashes". Attempted to contact social worker to see what can be done to help. Left message for case worker

## 2020-06-19 NOTE — ED Notes (Signed)
PT at bedside at this time 

## 2020-06-19 NOTE — Progress Notes (Signed)
Physical Therapy  Note  (Full PT eval note to follow)   Presents with generalized weakness and dependencies with functional mobility and activity tolerance;   Orthostatics performed during eval were inconclusive;   Her current home situation is not meeting her needs;   Current recommendation is for SNF level rehab to maximize independence and safety with mobility and ADLs, in prep for return to relative independence;  She had a successful rehab stay at [facility] last month  Optimal long-term living situation will be an Assisted Living Facility -- an ideal situation would be setting up for an ALF while she is in rehab, with the hopes of being able to transition from short term rehab at SNF to an ALF;   Lindsey Rowe agreed to consider ALF;   Noteworthy during session:   Lindsey Rowe mentioned feeling lonely at least 4x during approx 30 minutes with me;   She mentioned once that she "might be better off not being alive"   Discussed with Zoe, RN, and will notify MD;   Van Clines, PT  Acute Rehabilitation Services Pager [phone] Office [phone]

## 2020-06-20 DIAGNOSIS — R4020 Unspecified coma: Secondary | ICD-10-CM | POA: Diagnosis not present

## 2020-06-20 LAB — RESP PANEL BY RT-PCR (FLU A&B, COVID) ARPGX2
Influenza A by PCR: NEGATIVE
Influenza B by PCR: NEGATIVE
SARS Coronavirus 2 by RT PCR: NEGATIVE

## 2020-06-20 MED ORDER — IBUPROFEN 400 MG PO TABS
400.0000 mg | ORAL_TABLET | Freq: Four times a day (QID) | ORAL | Status: DC | PRN
  Administered 2020-06-20: 400 mg via ORAL
  Filled 2020-06-20: qty 1

## 2020-06-20 NOTE — ED Provider Notes (Signed)
Emergency Medicine Observation Re-evaluation Note  Lindsey Rowe is a 75 y.o. female, seen on rounds today.  Pt initially presented to the ED for complaints of Loss of Consciousness Currently, the patient is sleeping.  Physical Exam  BP (!) 149/80   Pulse 86   Temp 98.3 F (36.8 C) (Oral)   Resp 15   SpO2 99%  Physical Exam General: calm, no acute distress Cardiac: warm and well perfused Lungs: even and unlabored Psych: calm   ED Course / MDM  EKG:EKG Interpretation  Date/Time:  Saturday Jun 18 2020 20:01:02 EDT Ventricular Rate:  83 PR Interval:  122 QRS Duration: 90 QT Interval:  356 QTC Calculation: 418 R Axis:   22 Text Interpretation: Sinus rhythm with Premature atrial complexes Otherwise normal ECG NSR, no change from previous Confirmed by Coralee Pesa [phone]) on 06/18/2020 10:21:55 PM   I have reviewed the labs performed to date as well as medications administered while in observation.  Recent changes in the last 24 hours include PT eval yesterday rec SNF.  Plan  Current plan is for SNF placement Placement per TOC.   Milagros Loll, MD 06/20/20 0900

## 2020-06-20 NOTE — Progress Notes (Signed)
CSW spoke with patient about the SNF bed offers. Patient agreed to go to [facility]. Patient spoke about working with community hospice and she also stated she will need assistance with someone helping her pack up her apartment.

## 2020-06-20 NOTE — Discharge Instructions (Addendum)
Follow-up with your primary care doctor.  Return to ER if you have any additional episodes of passing out, lightheadedness, chest pain or other new concerning symptom.

## 2020-06-20 NOTE — Progress Notes (Signed)
CSW spoke with patient who is now saying she wants to go home and continue with home hospice. CSW cancelled SNF placement. CSW informed the nurse patient can be discharged home.

## 2020-06-20 NOTE — ED Notes (Signed)
[facility] was called.  

## 2020-06-20 NOTE — ED Notes (Signed)
Social worker Visual merchandiser) in with pt

## 2020-06-20 NOTE — Discharge Planning (Signed)
Pt currently active with [facility] for Home Hospice (RN/CNA/SW) services as confirmed by [facility] with Brittney of [facility].  Pt will resume HH services at discharge.

## 2020-06-20 NOTE — ED Notes (Signed)
tylenol given for HA

## 2020-06-25 DIAGNOSIS — R0602 Shortness of breath: Secondary | ICD-10-CM | POA: Diagnosis not present

## 2020-07-04 DIAGNOSIS — J45909 Unspecified asthma, uncomplicated: Secondary | ICD-10-CM | POA: Diagnosis not present

## 2020-07-04 DIAGNOSIS — J9601 Acute respiratory failure with hypoxia: Secondary | ICD-10-CM | POA: Diagnosis not present

## 2020-07-04 DIAGNOSIS — R0902 Hypoxemia: Secondary | ICD-10-CM | POA: Diagnosis not present

## 2020-07-05 DIAGNOSIS — Z111 Encounter for screening for respiratory tuberculosis: Secondary | ICD-10-CM | POA: Diagnosis not present

## 2020-07-13 DIAGNOSIS — Z111 Encounter for screening for respiratory tuberculosis: Secondary | ICD-10-CM | POA: Diagnosis not present

## 2020-07-25 ENCOUNTER — Emergency Department (HOSPITAL_COMMUNITY)
Admission: EM | Admit: 2020-07-25 | Discharge: 2020-07-26 | Disposition: A | Discharge status: Home / Self Care | Payer: Medicare HMO | Attending: Emergency Medicine | Admitting: Emergency Medicine

## 2020-07-25 DIAGNOSIS — R519 Headache, unspecified: Secondary | ICD-10-CM | POA: Insufficient documentation

## 2020-07-25 DIAGNOSIS — Z5321 Procedure and treatment not carried out due to patient leaving prior to being seen by health care provider: Secondary | ICD-10-CM | POA: Diagnosis not present

## 2020-07-25 DIAGNOSIS — R110 Nausea: Secondary | ICD-10-CM | POA: Diagnosis not present

## 2020-07-25 DIAGNOSIS — R509 Fever, unspecified: Secondary | ICD-10-CM | POA: Insufficient documentation

## 2020-07-25 DIAGNOSIS — R059 Cough, unspecified: Secondary | ICD-10-CM | POA: Diagnosis not present

## 2020-07-25 DIAGNOSIS — Z743 Need for continuous supervision: Secondary | ICD-10-CM | POA: Diagnosis not present

## 2020-07-25 DIAGNOSIS — G4489 Other headache syndrome: Secondary | ICD-10-CM | POA: Diagnosis not present

## 2020-07-25 DIAGNOSIS — R6889 Other general symptoms and signs: Secondary | ICD-10-CM | POA: Diagnosis not present

## 2020-07-25 NOTE — ED Triage Notes (Signed)
Per ems, pt from home. Pt reporting fever, chills and headache that started last night. Pt denies cp, sob, abd pain, n/v/d. EMS VS BP 160/94, P 80, SpO2 96% RA, CBG 131.

## 2020-07-25 NOTE — ED Provider Notes (Signed)
Emergency Medicine Provider Triage Evaluation Note  Lindsey Rowe , a 75 y.o. female  was evaluated in triage.  Pt complains of cough and subjective fever.  States she has been in the process of moving.  Has been in a really dusty environment.  Review of Systems  Positive: Fever, cough, sob Negative: Cp, abdominal pain  Physical Exam  BP (!) 164/119 (BP Location: Left Arm)   Pulse 99   Temp 98.9 F (37.2 C)   Resp 18   SpO2 99%  Gen:   Awake, no distress   Resp:  Normal effort  MSK:   Moves extremities without difficulty  Other:    Medical Decision Making  Medically screening exam initiated at 11:57 PM.  Appropriate orders placed.  Lindsey Rowe was informed that the remainder of the evaluation will be completed by another provider, this initial triage assessment does not replace that evaluation, and the importance of remaining in the ED until their evaluation is complete.  Patient states that she doesn't want to wait for workup.  She would like to leave AMA.  She has capacity to make her own medical decisions.     Roxy Horseman, PA-C 07/26/20 0005    Shon Baton, MD 07/26/20 [phone]

## 2020-07-25 NOTE — ED Notes (Addendum)
Pt refused Oxygen

## 2020-07-26 NOTE — ED Notes (Signed)
[facility] called to transport patient home. States that there are 6 ahead of patient.

## 2020-07-26 NOTE — ED Notes (Signed)
Patient Advocate called a taxi driver for pt she was tired of waiting. Pushed to car. Pt understood self pay

## 2020-07-26 NOTE — ED Triage Notes (Addendum)
Pt in triage. Pt refusing any medical treatment because she just wants to go home. Pt stated that she freaked out and is feeling better now. PTAR to be notified for discharge.

## 2020-07-26 NOTE — Discharge Instructions (Addendum)
Please return at anytime for new or worsening symptoms.

## 2020-07-26 NOTE — ED Notes (Signed)
PTAR called. 6 ahead of patient

## 2020-08-03 DIAGNOSIS — M25562 Pain in left knee: Secondary | ICD-10-CM | POA: Diagnosis not present

## 2020-08-03 DIAGNOSIS — S0990XA Unspecified injury of head, initial encounter: Secondary | ICD-10-CM | POA: Diagnosis not present

## 2020-08-03 DIAGNOSIS — W19XXXA Unspecified fall, initial encounter: Secondary | ICD-10-CM | POA: Diagnosis not present

## 2020-08-03 DIAGNOSIS — Z043 Encounter for examination and observation following other accident: Secondary | ICD-10-CM | POA: Diagnosis not present

## 2020-08-03 DIAGNOSIS — M47812 Spondylosis without myelopathy or radiculopathy, cervical region: Secondary | ICD-10-CM | POA: Diagnosis not present

## 2020-08-03 DIAGNOSIS — S0083XA Contusion of other part of head, initial encounter: Secondary | ICD-10-CM | POA: Diagnosis not present

## 2020-08-03 DIAGNOSIS — W050XXA Fall from non-moving wheelchair, initial encounter: Secondary | ICD-10-CM | POA: Diagnosis not present

## 2020-08-03 DIAGNOSIS — R0902 Hypoxemia: Secondary | ICD-10-CM | POA: Diagnosis not present

## 2020-08-03 DIAGNOSIS — S8001XA Contusion of right knee, initial encounter: Secondary | ICD-10-CM | POA: Diagnosis not present

## 2020-08-03 DIAGNOSIS — Z743 Need for continuous supervision: Secondary | ICD-10-CM | POA: Diagnosis not present

## 2020-08-03 DIAGNOSIS — M542 Cervicalgia: Secondary | ICD-10-CM | POA: Diagnosis not present

## 2020-08-04 DIAGNOSIS — J45909 Unspecified asthma, uncomplicated: Secondary | ICD-10-CM | POA: Diagnosis not present

## 2020-08-04 DIAGNOSIS — R0902 Hypoxemia: Secondary | ICD-10-CM | POA: Diagnosis not present

## 2020-08-04 DIAGNOSIS — J9601 Acute respiratory failure with hypoxia: Secondary | ICD-10-CM | POA: Diagnosis not present

## 2020-08-11 DIAGNOSIS — J449 Chronic obstructive pulmonary disease, unspecified: Secondary | ICD-10-CM | POA: Diagnosis not present

## 2020-08-11 DIAGNOSIS — J9691 Respiratory failure, unspecified with hypoxia: Secondary | ICD-10-CM | POA: Diagnosis not present

## 2020-08-11 DIAGNOSIS — Z299 Encounter for prophylactic measures, unspecified: Secondary | ICD-10-CM | POA: Diagnosis not present

## 2020-08-11 DIAGNOSIS — I712 Thoracic aortic aneurysm, without rupture: Secondary | ICD-10-CM | POA: Diagnosis not present

## 2020-08-11 DIAGNOSIS — I10 Essential (primary) hypertension: Secondary | ICD-10-CM | POA: Diagnosis not present

## 2020-08-18 DIAGNOSIS — I10 Essential (primary) hypertension: Secondary | ICD-10-CM | POA: Diagnosis not present

## 2020-08-18 DIAGNOSIS — L8992 Pressure ulcer of unspecified site, stage 2: Secondary | ICD-10-CM | POA: Diagnosis not present

## 2020-08-18 DIAGNOSIS — H04129 Dry eye syndrome of unspecified lacrimal gland: Secondary | ICD-10-CM | POA: Diagnosis not present

## 2020-08-18 DIAGNOSIS — H109 Unspecified conjunctivitis: Secondary | ICD-10-CM | POA: Diagnosis not present

## 2020-08-18 DIAGNOSIS — Z299 Encounter for prophylactic measures, unspecified: Secondary | ICD-10-CM | POA: Diagnosis not present

## 2020-08-19 DIAGNOSIS — K219 Gastro-esophageal reflux disease without esophagitis: Secondary | ICD-10-CM | POA: Diagnosis not present

## 2020-08-19 DIAGNOSIS — J449 Chronic obstructive pulmonary disease, unspecified: Secondary | ICD-10-CM | POA: Diagnosis not present

## 2020-08-19 DIAGNOSIS — I712 Thoracic aortic aneurysm, without rupture: Secondary | ICD-10-CM | POA: Diagnosis not present

## 2020-08-19 DIAGNOSIS — G43909 Migraine, unspecified, not intractable, without status migrainosus: Secondary | ICD-10-CM | POA: Diagnosis not present

## 2020-08-19 DIAGNOSIS — I129 Hypertensive chronic kidney disease with stage 1 through stage 4 chronic kidney disease, or unspecified chronic kidney disease: Secondary | ICD-10-CM | POA: Diagnosis not present

## 2020-08-19 DIAGNOSIS — N1831 Chronic kidney disease, stage 3a: Secondary | ICD-10-CM | POA: Diagnosis not present

## 2020-08-19 DIAGNOSIS — E039 Hypothyroidism, unspecified: Secondary | ICD-10-CM | POA: Diagnosis not present

## 2020-08-19 DIAGNOSIS — L89312 Pressure ulcer of right buttock, stage 2: Secondary | ICD-10-CM | POA: Diagnosis not present

## 2020-08-19 DIAGNOSIS — M797 Fibromyalgia: Secondary | ICD-10-CM | POA: Diagnosis not present

## 2020-08-23 DIAGNOSIS — J449 Chronic obstructive pulmonary disease, unspecified: Secondary | ICD-10-CM | POA: Diagnosis not present

## 2020-08-23 DIAGNOSIS — G43909 Migraine, unspecified, not intractable, without status migrainosus: Secondary | ICD-10-CM | POA: Diagnosis not present

## 2020-08-23 DIAGNOSIS — L89312 Pressure ulcer of right buttock, stage 2: Secondary | ICD-10-CM | POA: Diagnosis not present

## 2020-08-23 DIAGNOSIS — E039 Hypothyroidism, unspecified: Secondary | ICD-10-CM | POA: Diagnosis not present

## 2020-08-23 DIAGNOSIS — N1831 Chronic kidney disease, stage 3a: Secondary | ICD-10-CM | POA: Diagnosis not present

## 2020-08-23 DIAGNOSIS — I129 Hypertensive chronic kidney disease with stage 1 through stage 4 chronic kidney disease, or unspecified chronic kidney disease: Secondary | ICD-10-CM | POA: Diagnosis not present

## 2020-08-23 DIAGNOSIS — I712 Thoracic aortic aneurysm, without rupture: Secondary | ICD-10-CM | POA: Diagnosis not present

## 2020-08-23 DIAGNOSIS — K219 Gastro-esophageal reflux disease without esophagitis: Secondary | ICD-10-CM | POA: Diagnosis not present

## 2020-08-23 DIAGNOSIS — M797 Fibromyalgia: Secondary | ICD-10-CM | POA: Diagnosis not present

## 2020-08-24 DIAGNOSIS — R0902 Hypoxemia: Secondary | ICD-10-CM | POA: Diagnosis not present

## 2020-08-24 DIAGNOSIS — Z885 Allergy status to narcotic agent status: Secondary | ICD-10-CM | POA: Diagnosis not present

## 2020-08-24 DIAGNOSIS — S0003XA Contusion of scalp, initial encounter: Secondary | ICD-10-CM | POA: Diagnosis not present

## 2020-08-24 DIAGNOSIS — M4312 Spondylolisthesis, cervical region: Secondary | ICD-10-CM | POA: Diagnosis not present

## 2020-08-24 DIAGNOSIS — S199XXA Unspecified injury of neck, initial encounter: Secondary | ICD-10-CM | POA: Diagnosis not present

## 2020-08-24 DIAGNOSIS — H1820 Unspecified corneal edema: Secondary | ICD-10-CM | POA: Diagnosis not present

## 2020-08-24 DIAGNOSIS — S0990XA Unspecified injury of head, initial encounter: Secondary | ICD-10-CM | POA: Diagnosis not present

## 2020-08-24 DIAGNOSIS — R609 Edema, unspecified: Secondary | ICD-10-CM | POA: Diagnosis not present

## 2020-08-24 DIAGNOSIS — W1839XA Other fall on same level, initial encounter: Secondary | ICD-10-CM | POA: Diagnosis not present

## 2020-08-24 DIAGNOSIS — Z743 Need for continuous supervision: Secondary | ICD-10-CM | POA: Diagnosis not present

## 2020-08-24 DIAGNOSIS — R6889 Other general symptoms and signs: Secondary | ICD-10-CM | POA: Diagnosis not present

## 2020-08-24 DIAGNOSIS — M4802 Spinal stenosis, cervical region: Secondary | ICD-10-CM | POA: Diagnosis not present

## 2020-08-24 DIAGNOSIS — H16102 Unspecified superficial keratitis, left eye: Secondary | ICD-10-CM | POA: Diagnosis not present

## 2020-08-24 DIAGNOSIS — M2578 Osteophyte, vertebrae: Secondary | ICD-10-CM | POA: Diagnosis not present

## 2020-08-25 DIAGNOSIS — Z743 Need for continuous supervision: Secondary | ICD-10-CM | POA: Diagnosis not present

## 2020-08-25 DIAGNOSIS — M4312 Spondylolisthesis, cervical region: Secondary | ICD-10-CM | POA: Diagnosis not present

## 2020-08-25 DIAGNOSIS — I10 Essential (primary) hypertension: Secondary | ICD-10-CM | POA: Diagnosis not present

## 2020-08-25 DIAGNOSIS — S0003XA Contusion of scalp, initial encounter: Secondary | ICD-10-CM | POA: Diagnosis not present

## 2020-08-25 DIAGNOSIS — R279 Unspecified lack of coordination: Secondary | ICD-10-CM | POA: Diagnosis not present

## 2020-08-25 DIAGNOSIS — S199XXA Unspecified injury of neck, initial encounter: Secondary | ICD-10-CM | POA: Diagnosis not present

## 2020-08-25 DIAGNOSIS — M4802 Spinal stenosis, cervical region: Secondary | ICD-10-CM | POA: Diagnosis not present

## 2020-08-25 DIAGNOSIS — R296 Repeated falls: Secondary | ICD-10-CM | POA: Diagnosis not present

## 2020-08-25 DIAGNOSIS — F339 Major depressive disorder, recurrent, unspecified: Secondary | ICD-10-CM | POA: Diagnosis not present

## 2020-08-25 DIAGNOSIS — F319 Bipolar disorder, unspecified: Secondary | ICD-10-CM | POA: Diagnosis not present

## 2020-08-25 DIAGNOSIS — M2578 Osteophyte, vertebrae: Secondary | ICD-10-CM | POA: Diagnosis not present

## 2020-08-25 DIAGNOSIS — Z299 Encounter for prophylactic measures, unspecified: Secondary | ICD-10-CM | POA: Diagnosis not present

## 2020-08-26 DIAGNOSIS — E039 Hypothyroidism, unspecified: Secondary | ICD-10-CM | POA: Diagnosis not present

## 2020-08-26 DIAGNOSIS — G43909 Migraine, unspecified, not intractable, without status migrainosus: Secondary | ICD-10-CM | POA: Diagnosis not present

## 2020-08-26 DIAGNOSIS — J449 Chronic obstructive pulmonary disease, unspecified: Secondary | ICD-10-CM | POA: Diagnosis not present

## 2020-08-26 DIAGNOSIS — I712 Thoracic aortic aneurysm, without rupture: Secondary | ICD-10-CM | POA: Diagnosis not present

## 2020-08-26 DIAGNOSIS — K219 Gastro-esophageal reflux disease without esophagitis: Secondary | ICD-10-CM | POA: Diagnosis not present

## 2020-08-26 DIAGNOSIS — L89312 Pressure ulcer of right buttock, stage 2: Secondary | ICD-10-CM | POA: Diagnosis not present

## 2020-08-26 DIAGNOSIS — M797 Fibromyalgia: Secondary | ICD-10-CM | POA: Diagnosis not present

## 2020-08-26 DIAGNOSIS — N1831 Chronic kidney disease, stage 3a: Secondary | ICD-10-CM | POA: Diagnosis not present

## 2020-08-26 DIAGNOSIS — I129 Hypertensive chronic kidney disease with stage 1 through stage 4 chronic kidney disease, or unspecified chronic kidney disease: Secondary | ICD-10-CM | POA: Diagnosis not present

## 2020-08-30 DIAGNOSIS — I712 Thoracic aortic aneurysm, without rupture: Secondary | ICD-10-CM | POA: Diagnosis not present

## 2020-08-30 DIAGNOSIS — K219 Gastro-esophageal reflux disease without esophagitis: Secondary | ICD-10-CM | POA: Diagnosis not present

## 2020-08-30 DIAGNOSIS — I129 Hypertensive chronic kidney disease with stage 1 through stage 4 chronic kidney disease, or unspecified chronic kidney disease: Secondary | ICD-10-CM | POA: Diagnosis not present

## 2020-08-30 DIAGNOSIS — N1831 Chronic kidney disease, stage 3a: Secondary | ICD-10-CM | POA: Diagnosis not present

## 2020-08-30 DIAGNOSIS — E039 Hypothyroidism, unspecified: Secondary | ICD-10-CM | POA: Diagnosis not present

## 2020-08-30 DIAGNOSIS — L89312 Pressure ulcer of right buttock, stage 2: Secondary | ICD-10-CM | POA: Diagnosis not present

## 2020-08-30 DIAGNOSIS — M797 Fibromyalgia: Secondary | ICD-10-CM | POA: Diagnosis not present

## 2020-08-30 DIAGNOSIS — J449 Chronic obstructive pulmonary disease, unspecified: Secondary | ICD-10-CM | POA: Diagnosis not present

## 2020-08-30 DIAGNOSIS — G43909 Migraine, unspecified, not intractable, without status migrainosus: Secondary | ICD-10-CM | POA: Diagnosis not present

## 2020-08-31 DIAGNOSIS — E039 Hypothyroidism, unspecified: Secondary | ICD-10-CM | POA: Diagnosis not present

## 2020-08-31 DIAGNOSIS — J449 Chronic obstructive pulmonary disease, unspecified: Secondary | ICD-10-CM | POA: Diagnosis not present

## 2020-08-31 DIAGNOSIS — M797 Fibromyalgia: Secondary | ICD-10-CM | POA: Diagnosis not present

## 2020-08-31 DIAGNOSIS — I712 Thoracic aortic aneurysm, without rupture: Secondary | ICD-10-CM | POA: Diagnosis not present

## 2020-08-31 DIAGNOSIS — I129 Hypertensive chronic kidney disease with stage 1 through stage 4 chronic kidney disease, or unspecified chronic kidney disease: Secondary | ICD-10-CM | POA: Diagnosis not present

## 2020-08-31 DIAGNOSIS — L89312 Pressure ulcer of right buttock, stage 2: Secondary | ICD-10-CM | POA: Diagnosis not present

## 2020-08-31 DIAGNOSIS — K219 Gastro-esophageal reflux disease without esophagitis: Secondary | ICD-10-CM | POA: Diagnosis not present

## 2020-08-31 DIAGNOSIS — N1831 Chronic kidney disease, stage 3a: Secondary | ICD-10-CM | POA: Diagnosis not present

## 2020-08-31 DIAGNOSIS — G43909 Migraine, unspecified, not intractable, without status migrainosus: Secondary | ICD-10-CM | POA: Diagnosis not present

## 2020-09-01 DIAGNOSIS — E039 Hypothyroidism, unspecified: Secondary | ICD-10-CM | POA: Diagnosis not present

## 2020-09-01 DIAGNOSIS — G43909 Migraine, unspecified, not intractable, without status migrainosus: Secondary | ICD-10-CM | POA: Diagnosis not present

## 2020-09-01 DIAGNOSIS — I129 Hypertensive chronic kidney disease with stage 1 through stage 4 chronic kidney disease, or unspecified chronic kidney disease: Secondary | ICD-10-CM | POA: Diagnosis not present

## 2020-09-01 DIAGNOSIS — M797 Fibromyalgia: Secondary | ICD-10-CM | POA: Diagnosis not present

## 2020-09-01 DIAGNOSIS — L89312 Pressure ulcer of right buttock, stage 2: Secondary | ICD-10-CM | POA: Diagnosis not present

## 2020-09-01 DIAGNOSIS — N1831 Chronic kidney disease, stage 3a: Secondary | ICD-10-CM | POA: Diagnosis not present

## 2020-09-01 DIAGNOSIS — K219 Gastro-esophageal reflux disease without esophagitis: Secondary | ICD-10-CM | POA: Diagnosis not present

## 2020-09-01 DIAGNOSIS — I712 Thoracic aortic aneurysm, without rupture: Secondary | ICD-10-CM | POA: Diagnosis not present

## 2020-09-01 DIAGNOSIS — J449 Chronic obstructive pulmonary disease, unspecified: Secondary | ICD-10-CM | POA: Diagnosis not present

## 2020-09-02 DIAGNOSIS — N1831 Chronic kidney disease, stage 3a: Secondary | ICD-10-CM | POA: Diagnosis not present

## 2020-09-02 DIAGNOSIS — I129 Hypertensive chronic kidney disease with stage 1 through stage 4 chronic kidney disease, or unspecified chronic kidney disease: Secondary | ICD-10-CM | POA: Diagnosis not present

## 2020-09-02 DIAGNOSIS — K219 Gastro-esophageal reflux disease without esophagitis: Secondary | ICD-10-CM | POA: Diagnosis not present

## 2020-09-02 DIAGNOSIS — G43909 Migraine, unspecified, not intractable, without status migrainosus: Secondary | ICD-10-CM | POA: Diagnosis not present

## 2020-09-02 DIAGNOSIS — E039 Hypothyroidism, unspecified: Secondary | ICD-10-CM | POA: Diagnosis not present

## 2020-09-02 DIAGNOSIS — I712 Thoracic aortic aneurysm, without rupture: Secondary | ICD-10-CM | POA: Diagnosis not present

## 2020-09-02 DIAGNOSIS — M797 Fibromyalgia: Secondary | ICD-10-CM | POA: Diagnosis not present

## 2020-09-02 DIAGNOSIS — J449 Chronic obstructive pulmonary disease, unspecified: Secondary | ICD-10-CM | POA: Diagnosis not present

## 2020-09-02 DIAGNOSIS — L89312 Pressure ulcer of right buttock, stage 2: Secondary | ICD-10-CM | POA: Diagnosis not present

## 2020-09-03 DIAGNOSIS — J9601 Acute respiratory failure with hypoxia: Secondary | ICD-10-CM | POA: Diagnosis not present

## 2020-09-03 DIAGNOSIS — J45909 Unspecified asthma, uncomplicated: Secondary | ICD-10-CM | POA: Diagnosis not present

## 2020-09-03 DIAGNOSIS — R0902 Hypoxemia: Secondary | ICD-10-CM | POA: Diagnosis not present

## 2020-09-05 DIAGNOSIS — K219 Gastro-esophageal reflux disease without esophagitis: Secondary | ICD-10-CM | POA: Diagnosis not present

## 2020-09-05 DIAGNOSIS — N1831 Chronic kidney disease, stage 3a: Secondary | ICD-10-CM | POA: Diagnosis not present

## 2020-09-05 DIAGNOSIS — J449 Chronic obstructive pulmonary disease, unspecified: Secondary | ICD-10-CM | POA: Diagnosis not present

## 2020-09-05 DIAGNOSIS — M797 Fibromyalgia: Secondary | ICD-10-CM | POA: Diagnosis not present

## 2020-09-05 DIAGNOSIS — I129 Hypertensive chronic kidney disease with stage 1 through stage 4 chronic kidney disease, or unspecified chronic kidney disease: Secondary | ICD-10-CM | POA: Diagnosis not present

## 2020-09-05 DIAGNOSIS — G43909 Migraine, unspecified, not intractable, without status migrainosus: Secondary | ICD-10-CM | POA: Diagnosis not present

## 2020-09-05 DIAGNOSIS — I712 Thoracic aortic aneurysm, without rupture: Secondary | ICD-10-CM | POA: Diagnosis not present

## 2020-09-05 DIAGNOSIS — E039 Hypothyroidism, unspecified: Secondary | ICD-10-CM | POA: Diagnosis not present

## 2020-09-05 DIAGNOSIS — L89312 Pressure ulcer of right buttock, stage 2: Secondary | ICD-10-CM | POA: Diagnosis not present

## 2020-09-06 DIAGNOSIS — N1831 Chronic kidney disease, stage 3a: Secondary | ICD-10-CM | POA: Diagnosis not present

## 2020-09-06 DIAGNOSIS — E039 Hypothyroidism, unspecified: Secondary | ICD-10-CM | POA: Diagnosis not present

## 2020-09-06 DIAGNOSIS — L89312 Pressure ulcer of right buttock, stage 2: Secondary | ICD-10-CM | POA: Diagnosis not present

## 2020-09-06 DIAGNOSIS — M797 Fibromyalgia: Secondary | ICD-10-CM | POA: Diagnosis not present

## 2020-09-06 DIAGNOSIS — I712 Thoracic aortic aneurysm, without rupture: Secondary | ICD-10-CM | POA: Diagnosis not present

## 2020-09-06 DIAGNOSIS — K219 Gastro-esophageal reflux disease without esophagitis: Secondary | ICD-10-CM | POA: Diagnosis not present

## 2020-09-06 DIAGNOSIS — G43909 Migraine, unspecified, not intractable, without status migrainosus: Secondary | ICD-10-CM | POA: Diagnosis not present

## 2020-09-06 DIAGNOSIS — I129 Hypertensive chronic kidney disease with stage 1 through stage 4 chronic kidney disease, or unspecified chronic kidney disease: Secondary | ICD-10-CM | POA: Diagnosis not present

## 2020-09-06 DIAGNOSIS — J449 Chronic obstructive pulmonary disease, unspecified: Secondary | ICD-10-CM | POA: Diagnosis not present

## 2020-09-07 DIAGNOSIS — J449 Chronic obstructive pulmonary disease, unspecified: Secondary | ICD-10-CM | POA: Diagnosis not present

## 2020-09-07 DIAGNOSIS — E039 Hypothyroidism, unspecified: Secondary | ICD-10-CM | POA: Diagnosis not present

## 2020-09-07 DIAGNOSIS — N1831 Chronic kidney disease, stage 3a: Secondary | ICD-10-CM | POA: Diagnosis not present

## 2020-09-07 DIAGNOSIS — G43909 Migraine, unspecified, not intractable, without status migrainosus: Secondary | ICD-10-CM | POA: Diagnosis not present

## 2020-09-07 DIAGNOSIS — I129 Hypertensive chronic kidney disease with stage 1 through stage 4 chronic kidney disease, or unspecified chronic kidney disease: Secondary | ICD-10-CM | POA: Diagnosis not present

## 2020-09-07 DIAGNOSIS — K219 Gastro-esophageal reflux disease without esophagitis: Secondary | ICD-10-CM | POA: Diagnosis not present

## 2020-09-07 DIAGNOSIS — I712 Thoracic aortic aneurysm, without rupture: Secondary | ICD-10-CM | POA: Diagnosis not present

## 2020-09-07 DIAGNOSIS — M797 Fibromyalgia: Secondary | ICD-10-CM | POA: Diagnosis not present

## 2020-09-07 DIAGNOSIS — L89312 Pressure ulcer of right buttock, stage 2: Secondary | ICD-10-CM | POA: Diagnosis not present

## 2020-09-08 DIAGNOSIS — K219 Gastro-esophageal reflux disease without esophagitis: Secondary | ICD-10-CM | POA: Diagnosis not present

## 2020-09-08 DIAGNOSIS — N1831 Chronic kidney disease, stage 3a: Secondary | ICD-10-CM | POA: Diagnosis not present

## 2020-09-08 DIAGNOSIS — Z0000 Encounter for general adult medical examination without abnormal findings: Secondary | ICD-10-CM | POA: Diagnosis not present

## 2020-09-08 DIAGNOSIS — I129 Hypertensive chronic kidney disease with stage 1 through stage 4 chronic kidney disease, or unspecified chronic kidney disease: Secondary | ICD-10-CM | POA: Diagnosis not present

## 2020-09-08 DIAGNOSIS — G43909 Migraine, unspecified, not intractable, without status migrainosus: Secondary | ICD-10-CM | POA: Diagnosis not present

## 2020-09-08 DIAGNOSIS — I712 Thoracic aortic aneurysm, without rupture: Secondary | ICD-10-CM | POA: Diagnosis not present

## 2020-09-08 DIAGNOSIS — L89312 Pressure ulcer of right buttock, stage 2: Secondary | ICD-10-CM | POA: Diagnosis not present

## 2020-09-08 DIAGNOSIS — Z299 Encounter for prophylactic measures, unspecified: Secondary | ICD-10-CM | POA: Diagnosis not present

## 2020-09-08 DIAGNOSIS — J449 Chronic obstructive pulmonary disease, unspecified: Secondary | ICD-10-CM | POA: Diagnosis not present

## 2020-09-08 DIAGNOSIS — E039 Hypothyroidism, unspecified: Secondary | ICD-10-CM | POA: Diagnosis not present

## 2020-09-08 DIAGNOSIS — I10 Essential (primary) hypertension: Secondary | ICD-10-CM | POA: Diagnosis not present

## 2020-09-08 DIAGNOSIS — M797 Fibromyalgia: Secondary | ICD-10-CM | POA: Diagnosis not present

## 2020-09-13 DIAGNOSIS — M797 Fibromyalgia: Secondary | ICD-10-CM | POA: Diagnosis not present

## 2020-09-13 DIAGNOSIS — G43909 Migraine, unspecified, not intractable, without status migrainosus: Secondary | ICD-10-CM | POA: Diagnosis not present

## 2020-09-13 DIAGNOSIS — N1831 Chronic kidney disease, stage 3a: Secondary | ICD-10-CM | POA: Diagnosis not present

## 2020-09-13 DIAGNOSIS — K219 Gastro-esophageal reflux disease without esophagitis: Secondary | ICD-10-CM | POA: Diagnosis not present

## 2020-09-13 DIAGNOSIS — E039 Hypothyroidism, unspecified: Secondary | ICD-10-CM | POA: Diagnosis not present

## 2020-09-13 DIAGNOSIS — I129 Hypertensive chronic kidney disease with stage 1 through stage 4 chronic kidney disease, or unspecified chronic kidney disease: Secondary | ICD-10-CM | POA: Diagnosis not present

## 2020-09-13 DIAGNOSIS — L89312 Pressure ulcer of right buttock, stage 2: Secondary | ICD-10-CM | POA: Diagnosis not present

## 2020-09-13 DIAGNOSIS — I712 Thoracic aortic aneurysm, without rupture: Secondary | ICD-10-CM | POA: Diagnosis not present

## 2020-09-13 DIAGNOSIS — J449 Chronic obstructive pulmonary disease, unspecified: Secondary | ICD-10-CM | POA: Diagnosis not present

## 2020-09-14 DIAGNOSIS — H16223 Keratoconjunctivitis sicca, not specified as Sjogren's, bilateral: Secondary | ICD-10-CM | POA: Diagnosis not present

## 2020-09-14 DIAGNOSIS — H35342 Macular cyst, hole, or pseudohole, left eye: Secondary | ICD-10-CM | POA: Diagnosis not present

## 2020-09-14 DIAGNOSIS — H1820 Unspecified corneal edema: Secondary | ICD-10-CM | POA: Diagnosis not present

## 2020-09-15 DIAGNOSIS — I712 Thoracic aortic aneurysm, without rupture: Secondary | ICD-10-CM | POA: Diagnosis not present

## 2020-09-15 DIAGNOSIS — G43909 Migraine, unspecified, not intractable, without status migrainosus: Secondary | ICD-10-CM | POA: Diagnosis not present

## 2020-09-15 DIAGNOSIS — J449 Chronic obstructive pulmonary disease, unspecified: Secondary | ICD-10-CM | POA: Diagnosis not present

## 2020-09-15 DIAGNOSIS — N1831 Chronic kidney disease, stage 3a: Secondary | ICD-10-CM | POA: Diagnosis not present

## 2020-09-15 DIAGNOSIS — L89312 Pressure ulcer of right buttock, stage 2: Secondary | ICD-10-CM | POA: Diagnosis not present

## 2020-09-15 DIAGNOSIS — I129 Hypertensive chronic kidney disease with stage 1 through stage 4 chronic kidney disease, or unspecified chronic kidney disease: Secondary | ICD-10-CM | POA: Diagnosis not present

## 2020-09-15 DIAGNOSIS — M797 Fibromyalgia: Secondary | ICD-10-CM | POA: Diagnosis not present

## 2020-09-15 DIAGNOSIS — E039 Hypothyroidism, unspecified: Secondary | ICD-10-CM | POA: Diagnosis not present

## 2020-09-15 DIAGNOSIS — K219 Gastro-esophageal reflux disease without esophagitis: Secondary | ICD-10-CM | POA: Diagnosis not present

## 2020-09-19 DIAGNOSIS — K219 Gastro-esophageal reflux disease without esophagitis: Secondary | ICD-10-CM | POA: Diagnosis not present

## 2020-09-19 DIAGNOSIS — I712 Thoracic aortic aneurysm, without rupture: Secondary | ICD-10-CM | POA: Diagnosis not present

## 2020-09-19 DIAGNOSIS — I129 Hypertensive chronic kidney disease with stage 1 through stage 4 chronic kidney disease, or unspecified chronic kidney disease: Secondary | ICD-10-CM | POA: Diagnosis not present

## 2020-09-19 DIAGNOSIS — J449 Chronic obstructive pulmonary disease, unspecified: Secondary | ICD-10-CM | POA: Diagnosis not present

## 2020-09-19 DIAGNOSIS — G43909 Migraine, unspecified, not intractable, without status migrainosus: Secondary | ICD-10-CM | POA: Diagnosis not present

## 2020-09-19 DIAGNOSIS — L89312 Pressure ulcer of right buttock, stage 2: Secondary | ICD-10-CM | POA: Diagnosis not present

## 2020-09-19 DIAGNOSIS — N1831 Chronic kidney disease, stage 3a: Secondary | ICD-10-CM | POA: Diagnosis not present

## 2020-09-19 DIAGNOSIS — E039 Hypothyroidism, unspecified: Secondary | ICD-10-CM | POA: Diagnosis not present

## 2020-09-19 DIAGNOSIS — M797 Fibromyalgia: Secondary | ICD-10-CM | POA: Diagnosis not present

## 2020-09-20 DIAGNOSIS — M797 Fibromyalgia: Secondary | ICD-10-CM | POA: Diagnosis not present

## 2020-09-20 DIAGNOSIS — I129 Hypertensive chronic kidney disease with stage 1 through stage 4 chronic kidney disease, or unspecified chronic kidney disease: Secondary | ICD-10-CM | POA: Diagnosis not present

## 2020-09-20 DIAGNOSIS — G43909 Migraine, unspecified, not intractable, without status migrainosus: Secondary | ICD-10-CM | POA: Diagnosis not present

## 2020-09-20 DIAGNOSIS — L89312 Pressure ulcer of right buttock, stage 2: Secondary | ICD-10-CM | POA: Diagnosis not present

## 2020-09-20 DIAGNOSIS — I712 Thoracic aortic aneurysm, without rupture: Secondary | ICD-10-CM | POA: Diagnosis not present

## 2020-09-20 DIAGNOSIS — E039 Hypothyroidism, unspecified: Secondary | ICD-10-CM | POA: Diagnosis not present

## 2020-09-20 DIAGNOSIS — K219 Gastro-esophageal reflux disease without esophagitis: Secondary | ICD-10-CM | POA: Diagnosis not present

## 2020-09-20 DIAGNOSIS — J449 Chronic obstructive pulmonary disease, unspecified: Secondary | ICD-10-CM | POA: Diagnosis not present

## 2020-09-20 DIAGNOSIS — N1831 Chronic kidney disease, stage 3a: Secondary | ICD-10-CM | POA: Diagnosis not present

## 2020-09-21 DIAGNOSIS — N1831 Chronic kidney disease, stage 3a: Secondary | ICD-10-CM | POA: Diagnosis not present

## 2020-09-21 DIAGNOSIS — M797 Fibromyalgia: Secondary | ICD-10-CM | POA: Diagnosis not present

## 2020-09-21 DIAGNOSIS — E039 Hypothyroidism, unspecified: Secondary | ICD-10-CM | POA: Diagnosis not present

## 2020-09-21 DIAGNOSIS — G43909 Migraine, unspecified, not intractable, without status migrainosus: Secondary | ICD-10-CM | POA: Diagnosis not present

## 2020-09-21 DIAGNOSIS — J449 Chronic obstructive pulmonary disease, unspecified: Secondary | ICD-10-CM | POA: Diagnosis not present

## 2020-09-21 DIAGNOSIS — K219 Gastro-esophageal reflux disease without esophagitis: Secondary | ICD-10-CM | POA: Diagnosis not present

## 2020-09-21 DIAGNOSIS — I712 Thoracic aortic aneurysm, without rupture: Secondary | ICD-10-CM | POA: Diagnosis not present

## 2020-09-21 DIAGNOSIS — L89312 Pressure ulcer of right buttock, stage 2: Secondary | ICD-10-CM | POA: Diagnosis not present

## 2020-09-21 DIAGNOSIS — I129 Hypertensive chronic kidney disease with stage 1 through stage 4 chronic kidney disease, or unspecified chronic kidney disease: Secondary | ICD-10-CM | POA: Diagnosis not present

## 2020-09-22 DIAGNOSIS — R350 Frequency of micturition: Secondary | ICD-10-CM | POA: Diagnosis not present

## 2020-09-22 DIAGNOSIS — I10 Essential (primary) hypertension: Secondary | ICD-10-CM | POA: Diagnosis not present

## 2020-09-22 DIAGNOSIS — N1831 Chronic kidney disease, stage 3a: Secondary | ICD-10-CM | POA: Diagnosis not present

## 2020-09-22 DIAGNOSIS — G43909 Migraine, unspecified, not intractable, without status migrainosus: Secondary | ICD-10-CM | POA: Diagnosis not present

## 2020-09-22 DIAGNOSIS — E039 Hypothyroidism, unspecified: Secondary | ICD-10-CM | POA: Diagnosis not present

## 2020-09-22 DIAGNOSIS — K219 Gastro-esophageal reflux disease without esophagitis: Secondary | ICD-10-CM | POA: Diagnosis not present

## 2020-09-22 DIAGNOSIS — Z713 Dietary counseling and surveillance: Secondary | ICD-10-CM | POA: Diagnosis not present

## 2020-09-22 DIAGNOSIS — I712 Thoracic aortic aneurysm, without rupture: Secondary | ICD-10-CM | POA: Diagnosis not present

## 2020-09-22 DIAGNOSIS — I129 Hypertensive chronic kidney disease with stage 1 through stage 4 chronic kidney disease, or unspecified chronic kidney disease: Secondary | ICD-10-CM | POA: Diagnosis not present

## 2020-09-22 DIAGNOSIS — L89312 Pressure ulcer of right buttock, stage 2: Secondary | ICD-10-CM | POA: Diagnosis not present

## 2020-09-22 DIAGNOSIS — M797 Fibromyalgia: Secondary | ICD-10-CM | POA: Diagnosis not present

## 2020-09-22 DIAGNOSIS — J449 Chronic obstructive pulmonary disease, unspecified: Secondary | ICD-10-CM | POA: Diagnosis not present

## 2020-09-22 DIAGNOSIS — N390 Urinary tract infection, site not specified: Secondary | ICD-10-CM | POA: Diagnosis not present

## 2020-09-22 DIAGNOSIS — Z299 Encounter for prophylactic measures, unspecified: Secondary | ICD-10-CM | POA: Diagnosis not present

## 2020-09-26 DIAGNOSIS — G43909 Migraine, unspecified, not intractable, without status migrainosus: Secondary | ICD-10-CM | POA: Diagnosis not present

## 2020-09-26 DIAGNOSIS — N1831 Chronic kidney disease, stage 3a: Secondary | ICD-10-CM | POA: Diagnosis not present

## 2020-09-26 DIAGNOSIS — K219 Gastro-esophageal reflux disease without esophagitis: Secondary | ICD-10-CM | POA: Diagnosis not present

## 2020-09-26 DIAGNOSIS — I712 Thoracic aortic aneurysm, without rupture: Secondary | ICD-10-CM | POA: Diagnosis not present

## 2020-09-26 DIAGNOSIS — E039 Hypothyroidism, unspecified: Secondary | ICD-10-CM | POA: Diagnosis not present

## 2020-09-26 DIAGNOSIS — L89312 Pressure ulcer of right buttock, stage 2: Secondary | ICD-10-CM | POA: Diagnosis not present

## 2020-09-26 DIAGNOSIS — I129 Hypertensive chronic kidney disease with stage 1 through stage 4 chronic kidney disease, or unspecified chronic kidney disease: Secondary | ICD-10-CM | POA: Diagnosis not present

## 2020-09-26 DIAGNOSIS — M797 Fibromyalgia: Secondary | ICD-10-CM | POA: Diagnosis not present

## 2020-09-26 DIAGNOSIS — J449 Chronic obstructive pulmonary disease, unspecified: Secondary | ICD-10-CM | POA: Diagnosis not present

## 2020-09-28 DIAGNOSIS — K219 Gastro-esophageal reflux disease without esophagitis: Secondary | ICD-10-CM | POA: Diagnosis not present

## 2020-09-28 DIAGNOSIS — G43909 Migraine, unspecified, not intractable, without status migrainosus: Secondary | ICD-10-CM | POA: Diagnosis not present

## 2020-09-28 DIAGNOSIS — M797 Fibromyalgia: Secondary | ICD-10-CM | POA: Diagnosis not present

## 2020-09-28 DIAGNOSIS — J449 Chronic obstructive pulmonary disease, unspecified: Secondary | ICD-10-CM | POA: Diagnosis not present

## 2020-09-28 DIAGNOSIS — E039 Hypothyroidism, unspecified: Secondary | ICD-10-CM | POA: Diagnosis not present

## 2020-09-28 DIAGNOSIS — L89312 Pressure ulcer of right buttock, stage 2: Secondary | ICD-10-CM | POA: Diagnosis not present

## 2020-09-28 DIAGNOSIS — I129 Hypertensive chronic kidney disease with stage 1 through stage 4 chronic kidney disease, or unspecified chronic kidney disease: Secondary | ICD-10-CM | POA: Diagnosis not present

## 2020-09-28 DIAGNOSIS — N1831 Chronic kidney disease, stage 3a: Secondary | ICD-10-CM | POA: Diagnosis not present

## 2020-09-28 DIAGNOSIS — I712 Thoracic aortic aneurysm, without rupture: Secondary | ICD-10-CM | POA: Diagnosis not present

## 2020-09-29 DIAGNOSIS — Z299 Encounter for prophylactic measures, unspecified: Secondary | ICD-10-CM | POA: Diagnosis not present

## 2020-09-29 DIAGNOSIS — L603 Nail dystrophy: Secondary | ICD-10-CM | POA: Diagnosis not present

## 2020-09-29 DIAGNOSIS — I10 Essential (primary) hypertension: Secondary | ICD-10-CM | POA: Diagnosis not present

## 2020-09-29 DIAGNOSIS — Z713 Dietary counseling and surveillance: Secondary | ICD-10-CM | POA: Diagnosis not present

## 2020-10-04 DIAGNOSIS — H47322 Drusen of optic disc, left eye: Secondary | ICD-10-CM | POA: Diagnosis not present

## 2020-10-04 DIAGNOSIS — H35373 Puckering of macula, bilateral: Secondary | ICD-10-CM | POA: Diagnosis not present

## 2020-10-04 DIAGNOSIS — H35433 Paving stone degeneration of retina, bilateral: Secondary | ICD-10-CM | POA: Diagnosis not present

## 2020-10-04 DIAGNOSIS — J45909 Unspecified asthma, uncomplicated: Secondary | ICD-10-CM | POA: Diagnosis not present

## 2020-10-04 DIAGNOSIS — J9601 Acute respiratory failure with hypoxia: Secondary | ICD-10-CM | POA: Diagnosis not present

## 2020-10-04 DIAGNOSIS — R0902 Hypoxemia: Secondary | ICD-10-CM | POA: Diagnosis not present

## 2020-10-04 DIAGNOSIS — H43811 Vitreous degeneration, right eye: Secondary | ICD-10-CM | POA: Diagnosis not present

## 2020-10-05 DIAGNOSIS — N1831 Chronic kidney disease, stage 3a: Secondary | ICD-10-CM | POA: Diagnosis not present

## 2020-10-05 DIAGNOSIS — J449 Chronic obstructive pulmonary disease, unspecified: Secondary | ICD-10-CM | POA: Diagnosis not present

## 2020-10-05 DIAGNOSIS — K219 Gastro-esophageal reflux disease without esophagitis: Secondary | ICD-10-CM | POA: Diagnosis not present

## 2020-10-05 DIAGNOSIS — I129 Hypertensive chronic kidney disease with stage 1 through stage 4 chronic kidney disease, or unspecified chronic kidney disease: Secondary | ICD-10-CM | POA: Diagnosis not present

## 2020-10-05 DIAGNOSIS — M797 Fibromyalgia: Secondary | ICD-10-CM | POA: Diagnosis not present

## 2020-10-05 DIAGNOSIS — L89312 Pressure ulcer of right buttock, stage 2: Secondary | ICD-10-CM | POA: Diagnosis not present

## 2020-10-05 DIAGNOSIS — I712 Thoracic aortic aneurysm, without rupture: Secondary | ICD-10-CM | POA: Diagnosis not present

## 2020-10-05 DIAGNOSIS — E039 Hypothyroidism, unspecified: Secondary | ICD-10-CM | POA: Diagnosis not present

## 2020-10-05 DIAGNOSIS — G43909 Migraine, unspecified, not intractable, without status migrainosus: Secondary | ICD-10-CM | POA: Diagnosis not present

## 2020-10-06 DIAGNOSIS — I10 Essential (primary) hypertension: Secondary | ICD-10-CM | POA: Diagnosis not present

## 2020-10-06 DIAGNOSIS — L603 Nail dystrophy: Secondary | ICD-10-CM | POA: Diagnosis not present

## 2020-10-06 DIAGNOSIS — M2550 Pain in unspecified joint: Secondary | ICD-10-CM | POA: Diagnosis not present

## 2020-10-06 DIAGNOSIS — Z299 Encounter for prophylactic measures, unspecified: Secondary | ICD-10-CM | POA: Diagnosis not present

## 2020-10-11 DIAGNOSIS — I129 Hypertensive chronic kidney disease with stage 1 through stage 4 chronic kidney disease, or unspecified chronic kidney disease: Secondary | ICD-10-CM | POA: Diagnosis not present

## 2020-10-11 DIAGNOSIS — L89312 Pressure ulcer of right buttock, stage 2: Secondary | ICD-10-CM | POA: Diagnosis not present

## 2020-10-11 DIAGNOSIS — G43909 Migraine, unspecified, not intractable, without status migrainosus: Secondary | ICD-10-CM | POA: Diagnosis not present

## 2020-10-11 DIAGNOSIS — K219 Gastro-esophageal reflux disease without esophagitis: Secondary | ICD-10-CM | POA: Diagnosis not present

## 2020-10-11 DIAGNOSIS — N1831 Chronic kidney disease, stage 3a: Secondary | ICD-10-CM | POA: Diagnosis not present

## 2020-10-11 DIAGNOSIS — I712 Thoracic aortic aneurysm, without rupture: Secondary | ICD-10-CM | POA: Diagnosis not present

## 2020-10-11 DIAGNOSIS — M797 Fibromyalgia: Secondary | ICD-10-CM | POA: Diagnosis not present

## 2020-10-11 DIAGNOSIS — E039 Hypothyroidism, unspecified: Secondary | ICD-10-CM | POA: Diagnosis not present

## 2020-10-11 DIAGNOSIS — J449 Chronic obstructive pulmonary disease, unspecified: Secondary | ICD-10-CM | POA: Diagnosis not present

## 2020-10-13 DIAGNOSIS — I712 Thoracic aortic aneurysm, without rupture: Secondary | ICD-10-CM | POA: Diagnosis not present

## 2020-10-13 DIAGNOSIS — L89312 Pressure ulcer of right buttock, stage 2: Secondary | ICD-10-CM | POA: Diagnosis not present

## 2020-10-13 DIAGNOSIS — M797 Fibromyalgia: Secondary | ICD-10-CM | POA: Diagnosis not present

## 2020-10-13 DIAGNOSIS — E039 Hypothyroidism, unspecified: Secondary | ICD-10-CM | POA: Diagnosis not present

## 2020-10-13 DIAGNOSIS — G43909 Migraine, unspecified, not intractable, without status migrainosus: Secondary | ICD-10-CM | POA: Diagnosis not present

## 2020-10-13 DIAGNOSIS — I129 Hypertensive chronic kidney disease with stage 1 through stage 4 chronic kidney disease, or unspecified chronic kidney disease: Secondary | ICD-10-CM | POA: Diagnosis not present

## 2020-10-13 DIAGNOSIS — J449 Chronic obstructive pulmonary disease, unspecified: Secondary | ICD-10-CM | POA: Diagnosis not present

## 2020-10-13 DIAGNOSIS — N1831 Chronic kidney disease, stage 3a: Secondary | ICD-10-CM | POA: Diagnosis not present

## 2020-10-13 DIAGNOSIS — K219 Gastro-esophageal reflux disease without esophagitis: Secondary | ICD-10-CM | POA: Diagnosis not present

## 2020-10-14 DIAGNOSIS — K219 Gastro-esophageal reflux disease without esophagitis: Secondary | ICD-10-CM | POA: Diagnosis not present

## 2020-10-14 DIAGNOSIS — G43909 Migraine, unspecified, not intractable, without status migrainosus: Secondary | ICD-10-CM | POA: Diagnosis not present

## 2020-10-14 DIAGNOSIS — N1831 Chronic kidney disease, stage 3a: Secondary | ICD-10-CM | POA: Diagnosis not present

## 2020-10-14 DIAGNOSIS — I129 Hypertensive chronic kidney disease with stage 1 through stage 4 chronic kidney disease, or unspecified chronic kidney disease: Secondary | ICD-10-CM | POA: Diagnosis not present

## 2020-10-14 DIAGNOSIS — J449 Chronic obstructive pulmonary disease, unspecified: Secondary | ICD-10-CM | POA: Diagnosis not present

## 2020-10-14 DIAGNOSIS — E039 Hypothyroidism, unspecified: Secondary | ICD-10-CM | POA: Diagnosis not present

## 2020-10-14 DIAGNOSIS — L89312 Pressure ulcer of right buttock, stage 2: Secondary | ICD-10-CM | POA: Diagnosis not present

## 2020-10-14 DIAGNOSIS — I712 Thoracic aortic aneurysm, without rupture: Secondary | ICD-10-CM | POA: Diagnosis not present

## 2020-10-14 DIAGNOSIS — M797 Fibromyalgia: Secondary | ICD-10-CM | POA: Diagnosis not present

## 2020-10-25 DIAGNOSIS — G909 Disorder of the autonomic nervous system, unspecified: Secondary | ICD-10-CM | POA: Diagnosis not present

## 2020-10-25 DIAGNOSIS — S52531A Colles' fracture of right radius, initial encounter for closed fracture: Secondary | ICD-10-CM | POA: Diagnosis not present

## 2020-10-25 DIAGNOSIS — S40811A Abrasion of right upper arm, initial encounter: Secondary | ICD-10-CM | POA: Diagnosis not present

## 2020-10-25 DIAGNOSIS — S52501A Unspecified fracture of the lower end of right radius, initial encounter for closed fracture: Secondary | ICD-10-CM | POA: Diagnosis not present

## 2020-10-25 DIAGNOSIS — J449 Chronic obstructive pulmonary disease, unspecified: Secondary | ICD-10-CM | POA: Diagnosis not present

## 2020-10-25 DIAGNOSIS — N183 Chronic kidney disease, stage 3 unspecified: Secondary | ICD-10-CM | POA: Diagnosis not present

## 2020-10-25 DIAGNOSIS — I129 Hypertensive chronic kidney disease with stage 1 through stage 4 chronic kidney disease, or unspecified chronic kidney disease: Secondary | ICD-10-CM | POA: Diagnosis not present

## 2020-10-25 DIAGNOSIS — M25531 Pain in right wrist: Secondary | ICD-10-CM | POA: Diagnosis not present

## 2020-10-25 DIAGNOSIS — S62101A Fracture of unspecified carpal bone, right wrist, initial encounter for closed fracture: Secondary | ICD-10-CM | POA: Diagnosis not present

## 2020-10-25 DIAGNOSIS — W19XXXA Unspecified fall, initial encounter: Secondary | ICD-10-CM | POA: Diagnosis not present

## 2020-10-25 DIAGNOSIS — M7989 Other specified soft tissue disorders: Secondary | ICD-10-CM | POA: Diagnosis not present

## 2020-10-28 DIAGNOSIS — Z299 Encounter for prophylactic measures, unspecified: Secondary | ICD-10-CM | POA: Diagnosis not present

## 2020-10-28 DIAGNOSIS — S62101A Fracture of unspecified carpal bone, right wrist, initial encounter for closed fracture: Secondary | ICD-10-CM | POA: Diagnosis not present

## 2020-11-02 DIAGNOSIS — S52501A Unspecified fracture of the lower end of right radius, initial encounter for closed fracture: Secondary | ICD-10-CM | POA: Diagnosis not present

## 2020-11-02 DIAGNOSIS — M25531 Pain in right wrist: Secondary | ICD-10-CM | POA: Diagnosis not present

## 2020-11-04 DIAGNOSIS — J45909 Unspecified asthma, uncomplicated: Secondary | ICD-10-CM | POA: Diagnosis not present

## 2020-11-04 DIAGNOSIS — J9601 Acute respiratory failure with hypoxia: Secondary | ICD-10-CM | POA: Diagnosis not present

## 2020-11-04 DIAGNOSIS — R0902 Hypoxemia: Secondary | ICD-10-CM | POA: Diagnosis not present

## 2020-11-06 DIAGNOSIS — R69 Illness, unspecified: Secondary | ICD-10-CM | POA: Diagnosis not present

## 2020-11-06 DIAGNOSIS — W19XXXA Unspecified fall, initial encounter: Secondary | ICD-10-CM | POA: Diagnosis not present

## 2020-11-09 DIAGNOSIS — Z20822 Contact with and (suspected) exposure to covid-19: Secondary | ICD-10-CM | POA: Diagnosis not present

## 2020-11-09 DIAGNOSIS — Z87891 Personal history of nicotine dependence: Secondary | ICD-10-CM | POA: Diagnosis not present

## 2020-11-09 DIAGNOSIS — R410 Disorientation, unspecified: Secondary | ICD-10-CM | POA: Diagnosis not present

## 2020-11-09 DIAGNOSIS — R059 Cough, unspecified: Secondary | ICD-10-CM | POA: Diagnosis not present

## 2020-11-09 DIAGNOSIS — Z79899 Other long term (current) drug therapy: Secondary | ICD-10-CM | POA: Diagnosis not present

## 2020-11-09 DIAGNOSIS — G319 Degenerative disease of nervous system, unspecified: Secondary | ICD-10-CM | POA: Diagnosis not present

## 2020-11-09 DIAGNOSIS — F05 Delirium due to known physiological condition: Secondary | ICD-10-CM | POA: Diagnosis not present

## 2020-11-09 DIAGNOSIS — I6782 Cerebral ischemia: Secondary | ICD-10-CM | POA: Diagnosis not present

## 2020-11-09 DIAGNOSIS — N3001 Acute cystitis with hematuria: Secondary | ICD-10-CM | POA: Diagnosis not present

## 2020-11-09 DIAGNOSIS — G9389 Other specified disorders of brain: Secondary | ICD-10-CM | POA: Diagnosis not present

## 2020-11-09 DIAGNOSIS — R442 Other hallucinations: Secondary | ICD-10-CM | POA: Diagnosis not present

## 2020-11-09 DIAGNOSIS — F319 Bipolar disorder, unspecified: Secondary | ICD-10-CM | POA: Diagnosis not present

## 2020-11-09 DIAGNOSIS — R0902 Hypoxemia: Secondary | ICD-10-CM | POA: Diagnosis not present

## 2020-11-09 DIAGNOSIS — E079 Disorder of thyroid, unspecified: Secondary | ICD-10-CM | POA: Diagnosis not present

## 2020-11-17 DIAGNOSIS — R69 Illness, unspecified: Secondary | ICD-10-CM | POA: Diagnosis not present

## 2020-11-17 DIAGNOSIS — R5381 Other malaise: Secondary | ICD-10-CM | POA: Diagnosis not present

## 2020-11-18 DIAGNOSIS — S2231XA Fracture of one rib, right side, initial encounter for closed fracture: Secondary | ICD-10-CM | POA: Diagnosis not present

## 2020-11-18 DIAGNOSIS — S0990XA Unspecified injury of head, initial encounter: Secondary | ICD-10-CM | POA: Diagnosis not present

## 2020-11-18 DIAGNOSIS — J189 Pneumonia, unspecified organism: Secondary | ICD-10-CM | POA: Diagnosis not present

## 2020-11-18 DIAGNOSIS — Z20822 Contact with and (suspected) exposure to covid-19: Secondary | ICD-10-CM | POA: Diagnosis not present

## 2020-11-18 DIAGNOSIS — R531 Weakness: Secondary | ICD-10-CM | POA: Diagnosis not present

## 2020-11-18 DIAGNOSIS — W19XXXA Unspecified fall, initial encounter: Secondary | ICD-10-CM | POA: Diagnosis not present

## 2020-11-18 DIAGNOSIS — F339 Major depressive disorder, recurrent, unspecified: Secondary | ICD-10-CM | POA: Diagnosis not present

## 2020-11-18 DIAGNOSIS — Z043 Encounter for examination and observation following other accident: Secondary | ICD-10-CM | POA: Diagnosis not present

## 2020-11-18 DIAGNOSIS — R442 Other hallucinations: Secondary | ICD-10-CM | POA: Diagnosis not present

## 2020-11-18 DIAGNOSIS — R262 Difficulty in walking, not elsewhere classified: Secondary | ICD-10-CM | POA: Diagnosis not present

## 2020-11-18 DIAGNOSIS — F29 Unspecified psychosis not due to a substance or known physiological condition: Secondary | ICD-10-CM | POA: Diagnosis not present

## 2020-11-18 DIAGNOSIS — F329 Major depressive disorder, single episode, unspecified: Secondary | ICD-10-CM | POA: Diagnosis not present

## 2020-11-18 DIAGNOSIS — R7881 Bacteremia: Secondary | ICD-10-CM | POA: Diagnosis not present

## 2020-11-18 DIAGNOSIS — I959 Hypotension, unspecified: Secondary | ICD-10-CM | POA: Diagnosis not present

## 2020-11-18 DIAGNOSIS — R5381 Other malaise: Secondary | ICD-10-CM | POA: Diagnosis not present

## 2020-11-18 DIAGNOSIS — E039 Hypothyroidism, unspecified: Secondary | ICD-10-CM | POA: Diagnosis not present

## 2020-11-18 DIAGNOSIS — F419 Anxiety disorder, unspecified: Secondary | ICD-10-CM | POA: Diagnosis not present

## 2020-11-18 DIAGNOSIS — Z9181 History of falling: Secondary | ICD-10-CM | POA: Diagnosis not present

## 2020-11-18 DIAGNOSIS — R404 Transient alteration of awareness: Secondary | ICD-10-CM | POA: Diagnosis not present

## 2020-11-18 DIAGNOSIS — Z743 Need for continuous supervision: Secondary | ICD-10-CM | POA: Diagnosis not present

## 2020-11-18 DIAGNOSIS — M6281 Muscle weakness (generalized): Secondary | ICD-10-CM | POA: Diagnosis not present

## 2020-11-18 DIAGNOSIS — F319 Bipolar disorder, unspecified: Secondary | ICD-10-CM | POA: Diagnosis not present

## 2020-11-18 DIAGNOSIS — F32A Depression, unspecified: Secondary | ICD-10-CM | POA: Diagnosis not present

## 2020-11-18 DIAGNOSIS — F203 Undifferentiated schizophrenia: Secondary | ICD-10-CM | POA: Diagnosis not present

## 2020-11-18 DIAGNOSIS — R410 Disorientation, unspecified: Secondary | ICD-10-CM | POA: Diagnosis not present

## 2020-11-18 DIAGNOSIS — F05 Delirium due to known physiological condition: Secondary | ICD-10-CM | POA: Diagnosis not present

## 2020-11-18 DIAGNOSIS — Z6841 Body Mass Index (BMI) 40.0 and over, adult: Secondary | ICD-10-CM | POA: Diagnosis not present

## 2020-11-18 DIAGNOSIS — R4182 Altered mental status, unspecified: Secondary | ICD-10-CM | POA: Diagnosis not present

## 2020-11-18 DIAGNOSIS — Z8659 Personal history of other mental and behavioral disorders: Secondary | ICD-10-CM | POA: Diagnosis not present

## 2020-11-18 DIAGNOSIS — R400 Somnolence: Secondary | ICD-10-CM | POA: Diagnosis not present

## 2020-11-18 DIAGNOSIS — K219 Gastro-esophageal reflux disease without esophagitis: Secondary | ICD-10-CM | POA: Diagnosis not present

## 2020-11-18 DIAGNOSIS — W1839XA Other fall on same level, initial encounter: Secondary | ICD-10-CM | POA: Diagnosis not present

## 2020-11-18 DIAGNOSIS — R1311 Dysphagia, oral phase: Secondary | ICD-10-CM | POA: Diagnosis not present

## 2020-11-18 DIAGNOSIS — J9811 Atelectasis: Secondary | ICD-10-CM | POA: Diagnosis not present

## 2020-11-23 DIAGNOSIS — F329 Major depressive disorder, single episode, unspecified: Secondary | ICD-10-CM | POA: Diagnosis not present

## 2020-11-23 DIAGNOSIS — R0989 Other specified symptoms and signs involving the circulatory and respiratory systems: Secondary | ICD-10-CM | POA: Diagnosis not present

## 2020-11-23 DIAGNOSIS — J189 Pneumonia, unspecified organism: Secondary | ICD-10-CM | POA: Diagnosis not present

## 2020-11-23 DIAGNOSIS — F319 Bipolar disorder, unspecified: Secondary | ICD-10-CM | POA: Diagnosis not present

## 2020-11-23 DIAGNOSIS — E039 Hypothyroidism, unspecified: Secondary | ICD-10-CM | POA: Diagnosis not present

## 2020-11-23 DIAGNOSIS — H35373 Puckering of macula, bilateral: Secondary | ICD-10-CM | POA: Diagnosis not present

## 2020-11-23 DIAGNOSIS — H353221 Exudative age-related macular degeneration, left eye, with active choroidal neovascularization: Secondary | ICD-10-CM | POA: Diagnosis not present

## 2020-11-23 DIAGNOSIS — K219 Gastro-esophageal reflux disease without esophagitis: Secondary | ICD-10-CM | POA: Diagnosis not present

## 2020-11-23 DIAGNOSIS — M6281 Muscle weakness (generalized): Secondary | ICD-10-CM | POA: Diagnosis not present

## 2020-11-23 DIAGNOSIS — R531 Weakness: Secondary | ICD-10-CM | POA: Diagnosis not present

## 2020-11-23 DIAGNOSIS — J9601 Acute respiratory failure with hypoxia: Secondary | ICD-10-CM | POA: Diagnosis not present

## 2020-11-23 DIAGNOSIS — W19XXXA Unspecified fall, initial encounter: Secondary | ICD-10-CM | POA: Diagnosis not present

## 2020-11-23 DIAGNOSIS — D649 Anemia, unspecified: Secondary | ICD-10-CM | POA: Diagnosis not present

## 2020-11-23 DIAGNOSIS — J449 Chronic obstructive pulmonary disease, unspecified: Secondary | ICD-10-CM | POA: Diagnosis not present

## 2020-11-23 DIAGNOSIS — R1311 Dysphagia, oral phase: Secondary | ICD-10-CM | POA: Diagnosis not present

## 2020-11-23 DIAGNOSIS — F419 Anxiety disorder, unspecified: Secondary | ICD-10-CM | POA: Diagnosis not present

## 2020-11-23 DIAGNOSIS — R0902 Hypoxemia: Secondary | ICD-10-CM | POA: Diagnosis not present

## 2020-11-23 DIAGNOSIS — Z23 Encounter for immunization: Secondary | ICD-10-CM | POA: Diagnosis not present

## 2020-11-23 DIAGNOSIS — F339 Major depressive disorder, recurrent, unspecified: Secondary | ICD-10-CM | POA: Diagnosis not present

## 2020-11-23 DIAGNOSIS — H524 Presbyopia: Secondary | ICD-10-CM | POA: Diagnosis not present

## 2020-11-23 DIAGNOSIS — F32A Depression, unspecified: Secondary | ICD-10-CM | POA: Diagnosis not present

## 2020-11-23 DIAGNOSIS — E785 Hyperlipidemia, unspecified: Secondary | ICD-10-CM | POA: Diagnosis not present

## 2020-11-23 DIAGNOSIS — R262 Difficulty in walking, not elsewhere classified: Secondary | ICD-10-CM | POA: Diagnosis not present

## 2020-11-23 DIAGNOSIS — M25531 Pain in right wrist: Secondary | ICD-10-CM | POA: Diagnosis not present

## 2020-11-23 DIAGNOSIS — H35433 Paving stone degeneration of retina, bilateral: Secondary | ICD-10-CM | POA: Diagnosis not present

## 2020-11-23 DIAGNOSIS — H47322 Drusen of optic disc, left eye: Secondary | ICD-10-CM | POA: Diagnosis not present

## 2020-11-23 DIAGNOSIS — J45909 Unspecified asthma, uncomplicated: Secondary | ICD-10-CM | POA: Diagnosis not present

## 2020-11-23 DIAGNOSIS — I959 Hypotension, unspecified: Secondary | ICD-10-CM | POA: Diagnosis not present

## 2020-11-23 DIAGNOSIS — Z03818 Encounter for observation for suspected exposure to other biological agents ruled out: Secondary | ICD-10-CM | POA: Diagnosis not present

## 2020-11-23 DIAGNOSIS — H3581 Retinal edema: Secondary | ICD-10-CM | POA: Diagnosis not present

## 2020-11-23 DIAGNOSIS — F411 Generalized anxiety disorder: Secondary | ICD-10-CM | POA: Diagnosis not present

## 2020-11-23 DIAGNOSIS — Z961 Presence of intraocular lens: Secondary | ICD-10-CM | POA: Diagnosis not present

## 2020-11-23 DIAGNOSIS — F203 Undifferentiated schizophrenia: Secondary | ICD-10-CM | POA: Diagnosis not present

## 2020-11-23 DIAGNOSIS — S52501D Unspecified fracture of the lower end of right radius, subsequent encounter for closed fracture with routine healing: Secondary | ICD-10-CM | POA: Diagnosis not present

## 2020-11-24 DIAGNOSIS — F32A Depression, unspecified: Secondary | ICD-10-CM | POA: Diagnosis not present

## 2020-11-24 DIAGNOSIS — E785 Hyperlipidemia, unspecified: Secondary | ICD-10-CM | POA: Diagnosis not present

## 2020-11-24 DIAGNOSIS — F419 Anxiety disorder, unspecified: Secondary | ICD-10-CM | POA: Diagnosis not present

## 2020-11-24 DIAGNOSIS — J189 Pneumonia, unspecified organism: Secondary | ICD-10-CM | POA: Diagnosis not present

## 2020-11-24 DIAGNOSIS — F319 Bipolar disorder, unspecified: Secondary | ICD-10-CM | POA: Diagnosis not present

## 2020-11-24 DIAGNOSIS — J449 Chronic obstructive pulmonary disease, unspecified: Secondary | ICD-10-CM | POA: Diagnosis not present

## 2020-11-24 DIAGNOSIS — K219 Gastro-esophageal reflux disease without esophagitis: Secondary | ICD-10-CM | POA: Diagnosis not present

## 2020-11-24 DIAGNOSIS — D649 Anemia, unspecified: Secondary | ICD-10-CM | POA: Diagnosis not present

## 2020-12-06 DIAGNOSIS — Z23 Encounter for immunization: Secondary | ICD-10-CM | POA: Diagnosis not present

## 2020-12-12 DIAGNOSIS — M25531 Pain in right wrist: Secondary | ICD-10-CM | POA: Diagnosis not present

## 2020-12-14 DIAGNOSIS — H524 Presbyopia: Secondary | ICD-10-CM | POA: Diagnosis not present

## 2020-12-14 DIAGNOSIS — Z961 Presence of intraocular lens: Secondary | ICD-10-CM | POA: Diagnosis not present

## 2020-12-14 DIAGNOSIS — H353221 Exudative age-related macular degeneration, left eye, with active choroidal neovascularization: Secondary | ICD-10-CM | POA: Diagnosis not present

## 2020-12-15 DIAGNOSIS — H524 Presbyopia: Secondary | ICD-10-CM | POA: Diagnosis not present

## 2020-12-21 DIAGNOSIS — F411 Generalized anxiety disorder: Secondary | ICD-10-CM | POA: Diagnosis not present

## 2020-12-21 DIAGNOSIS — F203 Undifferentiated schizophrenia: Secondary | ICD-10-CM | POA: Diagnosis not present

## 2020-12-30 DIAGNOSIS — D649 Anemia, unspecified: Secondary | ICD-10-CM | POA: Diagnosis not present

## 2020-12-30 DIAGNOSIS — J449 Chronic obstructive pulmonary disease, unspecified: Secondary | ICD-10-CM | POA: Diagnosis not present

## 2020-12-30 DIAGNOSIS — F32A Depression, unspecified: Secondary | ICD-10-CM | POA: Diagnosis not present

## 2020-12-30 DIAGNOSIS — E785 Hyperlipidemia, unspecified: Secondary | ICD-10-CM | POA: Diagnosis not present

## 2020-12-30 DIAGNOSIS — F319 Bipolar disorder, unspecified: Secondary | ICD-10-CM | POA: Diagnosis not present

## 2020-12-30 DIAGNOSIS — E039 Hypothyroidism, unspecified: Secondary | ICD-10-CM | POA: Diagnosis not present

## 2020-12-30 DIAGNOSIS — F419 Anxiety disorder, unspecified: Secondary | ICD-10-CM | POA: Diagnosis not present

## 2021-01-06 DIAGNOSIS — F319 Bipolar disorder, unspecified: Secondary | ICD-10-CM | POA: Diagnosis not present

## 2021-01-06 IMAGING — DX DG CHEST 1V PORT
1 series · 1 of 1 positions shown · non-contrast
Comparison: CT 10/08/2018, radiograph 10/07/2018

CLINICAL DATA: Shortness of breath the and at 4 p.m.

EXAM:
PORTABLE CHEST 1 VIEW

[chest]
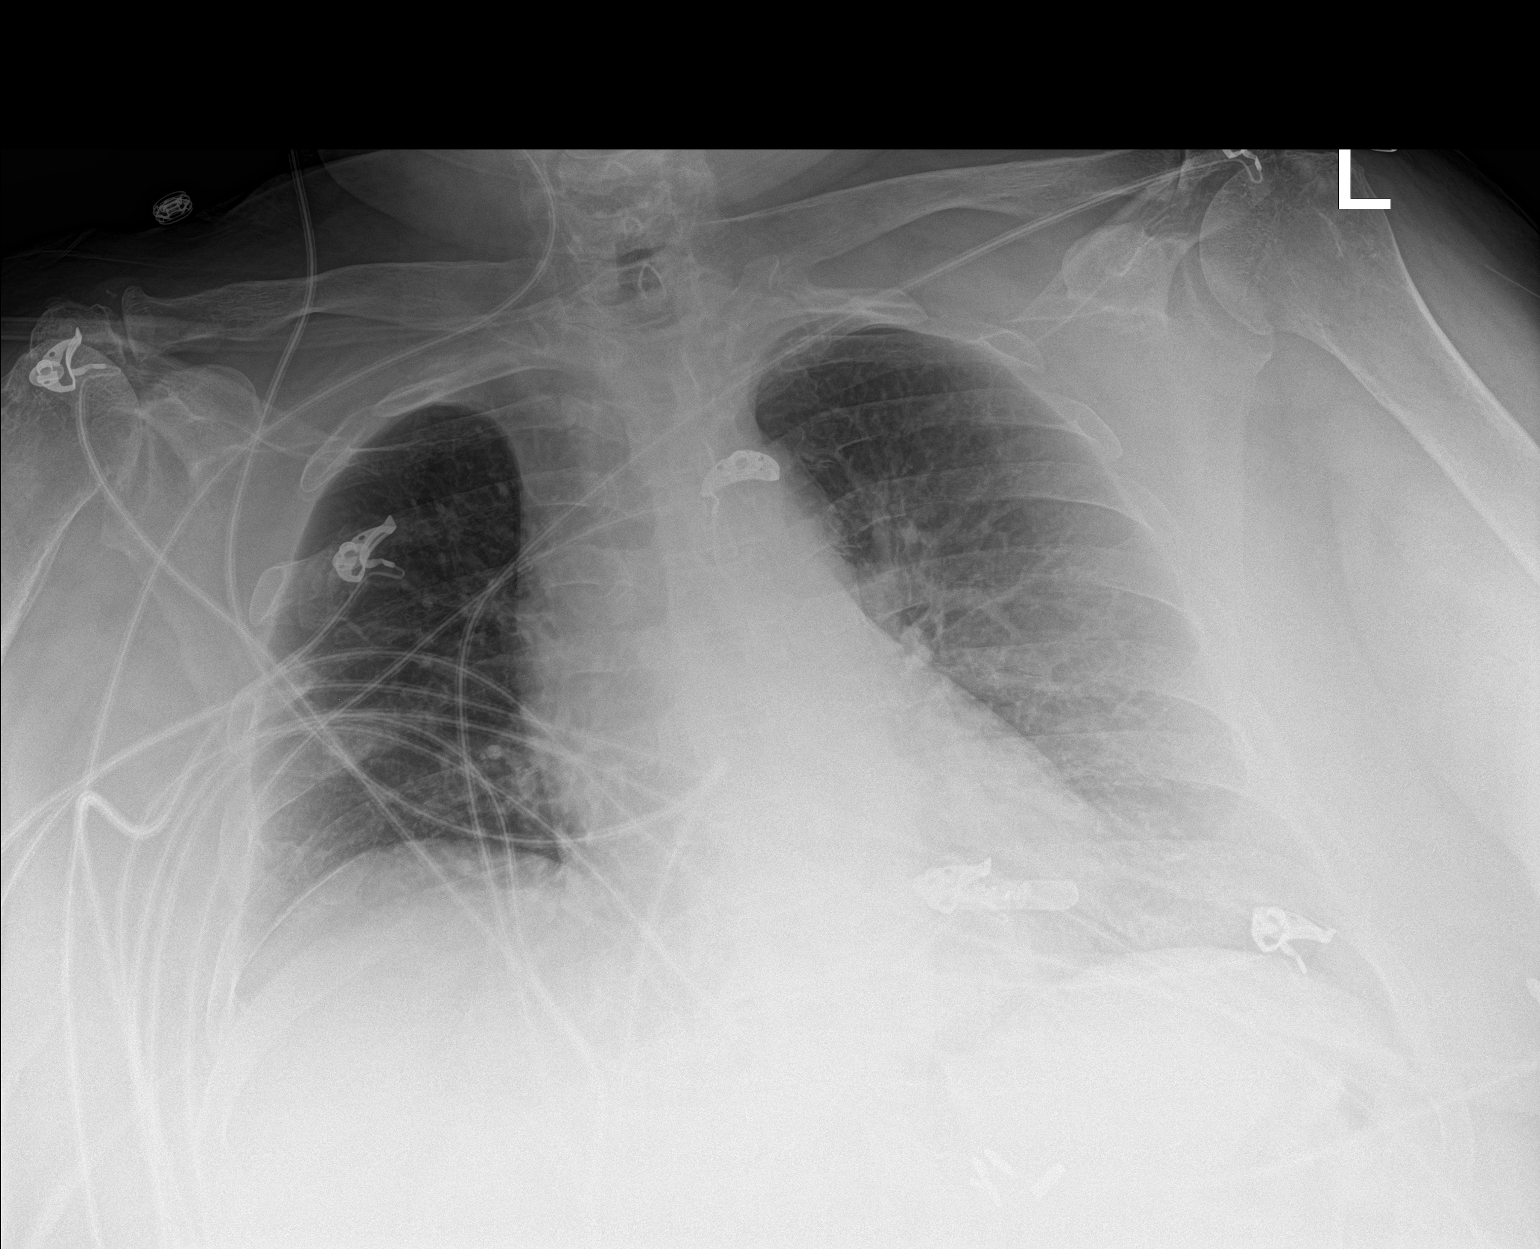

[1 of 1 positions shown; findings below may reference images not displayed]

FINDINGS: Lung volumes are markedly diminished. There is chronic elevation of
the right hemidiaphragm, similar to comparison studies. Basilar
atelectatic changes are noted. More gradient opacity in the left
lung base likely reflects a combination of body habitus and abundant
pericardial fat seen on prior CT. No focal consolidative process is
seen. No pneumothorax or visible effusion. Cardiomediastinal
contours are stable from prior. Degenerative changes are present in
the imaged spine and shoulders. No acute osseous or soft tissue
abnormality.
IMPRESSION: Accounting for basilar atelectasis and body habitus, the lungs are
clear

## 2021-01-13 DIAGNOSIS — F319 Bipolar disorder, unspecified: Secondary | ICD-10-CM | POA: Diagnosis not present

## 2021-01-13 DIAGNOSIS — F419 Anxiety disorder, unspecified: Secondary | ICD-10-CM | POA: Diagnosis not present

## 2021-01-18 DIAGNOSIS — F411 Generalized anxiety disorder: Secondary | ICD-10-CM | POA: Diagnosis not present

## 2021-01-18 DIAGNOSIS — F203 Undifferentiated schizophrenia: Secondary | ICD-10-CM | POA: Diagnosis not present

## 2021-01-23 DIAGNOSIS — M25531 Pain in right wrist: Secondary | ICD-10-CM | POA: Diagnosis not present

## 2021-01-23 DIAGNOSIS — S52501D Unspecified fracture of the lower end of right radius, subsequent encounter for closed fracture with routine healing: Secondary | ICD-10-CM | POA: Diagnosis not present

## 2021-01-24 DIAGNOSIS — R0989 Other specified symptoms and signs involving the circulatory and respiratory systems: Secondary | ICD-10-CM | POA: Diagnosis not present

## 2021-01-25 DIAGNOSIS — J189 Pneumonia, unspecified organism: Secondary | ICD-10-CM | POA: Diagnosis not present

## 2021-01-26 DIAGNOSIS — Z03818 Encounter for observation for suspected exposure to other biological agents ruled out: Secondary | ICD-10-CM | POA: Diagnosis not present

## 2021-02-03 DIAGNOSIS — J45909 Unspecified asthma, uncomplicated: Secondary | ICD-10-CM | POA: Diagnosis not present

## 2021-02-03 DIAGNOSIS — J9601 Acute respiratory failure with hypoxia: Secondary | ICD-10-CM | POA: Diagnosis not present

## 2021-02-03 DIAGNOSIS — R0902 Hypoxemia: Secondary | ICD-10-CM | POA: Diagnosis not present

## 2021-02-10 DIAGNOSIS — H35433 Paving stone degeneration of retina, bilateral: Secondary | ICD-10-CM | POA: Diagnosis not present

## 2021-02-10 DIAGNOSIS — H3581 Retinal edema: Secondary | ICD-10-CM | POA: Diagnosis not present

## 2021-02-10 DIAGNOSIS — H47322 Drusen of optic disc, left eye: Secondary | ICD-10-CM | POA: Diagnosis not present

## 2021-02-10 DIAGNOSIS — H35373 Puckering of macula, bilateral: Secondary | ICD-10-CM | POA: Diagnosis not present

## 2021-02-21 DIAGNOSIS — F203 Undifferentiated schizophrenia: Secondary | ICD-10-CM | POA: Diagnosis not present

## 2021-02-21 DIAGNOSIS — F411 Generalized anxiety disorder: Secondary | ICD-10-CM | POA: Diagnosis not present

## 2021-02-27 DIAGNOSIS — M25531 Pain in right wrist: Secondary | ICD-10-CM | POA: Diagnosis not present

## 2021-03-02 DIAGNOSIS — F319 Bipolar disorder, unspecified: Secondary | ICD-10-CM | POA: Diagnosis not present

## 2021-03-02 DIAGNOSIS — E039 Hypothyroidism, unspecified: Secondary | ICD-10-CM | POA: Diagnosis not present

## 2021-03-02 DIAGNOSIS — J449 Chronic obstructive pulmonary disease, unspecified: Secondary | ICD-10-CM | POA: Diagnosis not present

## 2021-03-02 DIAGNOSIS — K219 Gastro-esophageal reflux disease without esophagitis: Secondary | ICD-10-CM | POA: Diagnosis not present

## 2021-03-06 DIAGNOSIS — J9601 Acute respiratory failure with hypoxia: Secondary | ICD-10-CM | POA: Diagnosis not present

## 2021-03-06 DIAGNOSIS — R0902 Hypoxemia: Secondary | ICD-10-CM | POA: Diagnosis not present

## 2021-03-06 DIAGNOSIS — J45909 Unspecified asthma, uncomplicated: Secondary | ICD-10-CM | POA: Diagnosis not present

## 2021-03-13 DIAGNOSIS — Z299 Encounter for prophylactic measures, unspecified: Secondary | ICD-10-CM | POA: Diagnosis not present

## 2021-03-13 DIAGNOSIS — Z6834 Body mass index (BMI) 34.0-34.9, adult: Secondary | ICD-10-CM | POA: Diagnosis not present

## 2021-03-13 DIAGNOSIS — J449 Chronic obstructive pulmonary disease, unspecified: Secondary | ICD-10-CM | POA: Diagnosis not present

## 2021-03-13 DIAGNOSIS — S62102A Fracture of unspecified carpal bone, left wrist, initial encounter for closed fracture: Secondary | ICD-10-CM | POA: Diagnosis not present

## 2021-03-13 DIAGNOSIS — Z789 Other specified health status: Secondary | ICD-10-CM | POA: Diagnosis not present

## 2021-03-13 DIAGNOSIS — E039 Hypothyroidism, unspecified: Secondary | ICD-10-CM | POA: Diagnosis not present

## 2021-03-13 DIAGNOSIS — F339 Major depressive disorder, recurrent, unspecified: Secondary | ICD-10-CM | POA: Diagnosis not present

## 2021-03-20 DIAGNOSIS — F3131 Bipolar disorder, current episode depressed, mild: Secondary | ICD-10-CM | POA: Diagnosis not present

## 2021-03-20 DIAGNOSIS — K219 Gastro-esophageal reflux disease without esophagitis: Secondary | ICD-10-CM | POA: Diagnosis not present

## 2021-03-20 DIAGNOSIS — E039 Hypothyroidism, unspecified: Secondary | ICD-10-CM | POA: Diagnosis not present

## 2021-03-20 DIAGNOSIS — G43919 Migraine, unspecified, intractable, without status migrainosus: Secondary | ICD-10-CM | POA: Diagnosis not present

## 2021-03-20 DIAGNOSIS — Z6839 Body mass index (BMI) 39.0-39.9, adult: Secondary | ICD-10-CM | POA: Diagnosis not present

## 2021-03-20 DIAGNOSIS — M545 Low back pain, unspecified: Secondary | ICD-10-CM | POA: Diagnosis not present

## 2021-03-22 DIAGNOSIS — J449 Chronic obstructive pulmonary disease, unspecified: Secondary | ICD-10-CM | POA: Diagnosis not present

## 2021-03-22 DIAGNOSIS — Z6834 Body mass index (BMI) 34.0-34.9, adult: Secondary | ICD-10-CM | POA: Diagnosis not present

## 2021-03-22 DIAGNOSIS — H109 Unspecified conjunctivitis: Secondary | ICD-10-CM | POA: Diagnosis not present

## 2021-03-22 DIAGNOSIS — F319 Bipolar disorder, unspecified: Secondary | ICD-10-CM | POA: Diagnosis not present

## 2021-03-22 DIAGNOSIS — Z299 Encounter for prophylactic measures, unspecified: Secondary | ICD-10-CM | POA: Diagnosis not present

## 2021-04-04 DIAGNOSIS — Z6834 Body mass index (BMI) 34.0-34.9, adult: Secondary | ICD-10-CM | POA: Diagnosis not present

## 2021-04-04 DIAGNOSIS — Z66 Do not resuscitate: Secondary | ICD-10-CM | POA: Diagnosis not present

## 2021-04-04 DIAGNOSIS — R0902 Hypoxemia: Secondary | ICD-10-CM | POA: Diagnosis not present

## 2021-04-04 DIAGNOSIS — R2681 Unsteadiness on feet: Secondary | ICD-10-CM | POA: Diagnosis not present

## 2021-04-04 DIAGNOSIS — M1990 Unspecified osteoarthritis, unspecified site: Secondary | ICD-10-CM | POA: Diagnosis not present

## 2021-04-04 DIAGNOSIS — Z789 Other specified health status: Secondary | ICD-10-CM | POA: Diagnosis not present

## 2021-04-04 DIAGNOSIS — R32 Unspecified urinary incontinence: Secondary | ICD-10-CM | POA: Diagnosis not present

## 2021-04-04 DIAGNOSIS — J9601 Acute respiratory failure with hypoxia: Secondary | ICD-10-CM | POA: Diagnosis not present

## 2021-04-04 DIAGNOSIS — J45909 Unspecified asthma, uncomplicated: Secondary | ICD-10-CM | POA: Diagnosis not present

## 2021-04-04 DIAGNOSIS — N1831 Chronic kidney disease, stage 3a: Secondary | ICD-10-CM | POA: Diagnosis not present

## 2021-04-04 DIAGNOSIS — I10 Essential (primary) hypertension: Secondary | ICD-10-CM | POA: Diagnosis not present

## 2021-04-04 DIAGNOSIS — Z7689 Persons encountering health services in other specified circumstances: Secondary | ICD-10-CM | POA: Diagnosis not present

## 2021-04-04 DIAGNOSIS — Z299 Encounter for prophylactic measures, unspecified: Secondary | ICD-10-CM | POA: Diagnosis not present

## 2021-04-07 DIAGNOSIS — S52531D Colles' fracture of right radius, subsequent encounter for closed fracture with routine healing: Secondary | ICD-10-CM | POA: Diagnosis not present

## 2021-04-07 DIAGNOSIS — E039 Hypothyroidism, unspecified: Secondary | ICD-10-CM | POA: Diagnosis not present

## 2021-04-07 DIAGNOSIS — F319 Bipolar disorder, unspecified: Secondary | ICD-10-CM | POA: Diagnosis not present

## 2021-04-07 DIAGNOSIS — G629 Polyneuropathy, unspecified: Secondary | ICD-10-CM | POA: Diagnosis not present

## 2021-04-12 DIAGNOSIS — R32 Unspecified urinary incontinence: Secondary | ICD-10-CM | POA: Diagnosis not present

## 2021-04-12 DIAGNOSIS — Z6836 Body mass index (BMI) 36.0-36.9, adult: Secondary | ICD-10-CM | POA: Diagnosis not present

## 2021-04-12 DIAGNOSIS — Z299 Encounter for prophylactic measures, unspecified: Secondary | ICD-10-CM | POA: Diagnosis not present

## 2021-04-12 DIAGNOSIS — R2681 Unsteadiness on feet: Secondary | ICD-10-CM | POA: Diagnosis not present

## 2021-04-12 DIAGNOSIS — I7121 Aneurysm of the ascending aorta, without rupture: Secondary | ICD-10-CM | POA: Diagnosis not present

## 2021-04-12 DIAGNOSIS — I10 Essential (primary) hypertension: Secondary | ICD-10-CM | POA: Diagnosis not present

## 2021-04-17 DIAGNOSIS — E2839 Other primary ovarian failure: Secondary | ICD-10-CM | POA: Diagnosis not present

## 2021-04-17 DIAGNOSIS — T1490XA Injury, unspecified, initial encounter: Secondary | ICD-10-CM | POA: Diagnosis not present

## 2021-04-17 DIAGNOSIS — M8440XA Pathological fracture, unspecified site, initial encounter for fracture: Secondary | ICD-10-CM | POA: Diagnosis not present

## 2021-05-11 DIAGNOSIS — R5381 Other malaise: Secondary | ICD-10-CM | POA: Diagnosis not present

## 2021-05-11 DIAGNOSIS — T1490XA Injury, unspecified, initial encounter: Secondary | ICD-10-CM | POA: Diagnosis not present

## 2021-05-11 DIAGNOSIS — W19XXXA Unspecified fall, initial encounter: Secondary | ICD-10-CM | POA: Diagnosis not present

## 2021-05-22 DIAGNOSIS — Z713 Dietary counseling and surveillance: Secondary | ICD-10-CM | POA: Diagnosis not present

## 2021-05-22 DIAGNOSIS — L97409 Non-pressure chronic ulcer of unspecified heel and midfoot with unspecified severity: Secondary | ICD-10-CM | POA: Diagnosis not present

## 2021-05-22 DIAGNOSIS — Z789 Other specified health status: Secondary | ICD-10-CM | POA: Diagnosis not present

## 2021-05-22 DIAGNOSIS — Z299 Encounter for prophylactic measures, unspecified: Secondary | ICD-10-CM | POA: Diagnosis not present

## 2021-05-22 DIAGNOSIS — Z6836 Body mass index (BMI) 36.0-36.9, adult: Secondary | ICD-10-CM | POA: Diagnosis not present

## 2021-05-22 DIAGNOSIS — I10 Essential (primary) hypertension: Secondary | ICD-10-CM | POA: Diagnosis not present

## 2021-05-29 DIAGNOSIS — F319 Bipolar disorder, unspecified: Secondary | ICD-10-CM | POA: Diagnosis not present

## 2021-05-29 DIAGNOSIS — G629 Polyneuropathy, unspecified: Secondary | ICD-10-CM | POA: Diagnosis not present

## 2021-05-29 DIAGNOSIS — E039 Hypothyroidism, unspecified: Secondary | ICD-10-CM | POA: Diagnosis not present

## 2021-05-29 DIAGNOSIS — S52531D Colles' fracture of right radius, subsequent encounter for closed fracture with routine healing: Secondary | ICD-10-CM | POA: Diagnosis not present

## 2021-06-27 DIAGNOSIS — G43909 Migraine, unspecified, not intractable, without status migrainosus: Secondary | ICD-10-CM | POA: Diagnosis not present

## 2021-06-27 DIAGNOSIS — I10 Essential (primary) hypertension: Secondary | ICD-10-CM | POA: Diagnosis not present

## 2021-06-27 DIAGNOSIS — M1990 Unspecified osteoarthritis, unspecified site: Secondary | ICD-10-CM | POA: Diagnosis not present

## 2021-06-27 DIAGNOSIS — Z299 Encounter for prophylactic measures, unspecified: Secondary | ICD-10-CM | POA: Diagnosis not present

## 2021-07-03 DIAGNOSIS — R0902 Hypoxemia: Secondary | ICD-10-CM | POA: Diagnosis not present

## 2021-07-07 DIAGNOSIS — W19XXXA Unspecified fall, initial encounter: Secondary | ICD-10-CM | POA: Diagnosis not present

## 2021-07-07 DIAGNOSIS — T1490XA Injury, unspecified, initial encounter: Secondary | ICD-10-CM | POA: Diagnosis not present

## 2021-07-07 DIAGNOSIS — R5381 Other malaise: Secondary | ICD-10-CM | POA: Diagnosis not present

## 2021-07-21 DIAGNOSIS — I10 Essential (primary) hypertension: Secondary | ICD-10-CM | POA: Diagnosis not present

## 2021-07-21 DIAGNOSIS — Z299 Encounter for prophylactic measures, unspecified: Secondary | ICD-10-CM | POA: Diagnosis not present

## 2021-07-21 DIAGNOSIS — Z789 Other specified health status: Secondary | ICD-10-CM | POA: Diagnosis not present

## 2021-07-21 DIAGNOSIS — M545 Low back pain, unspecified: Secondary | ICD-10-CM | POA: Diagnosis not present

## 2021-07-21 DIAGNOSIS — Z6836 Body mass index (BMI) 36.0-36.9, adult: Secondary | ICD-10-CM | POA: Diagnosis not present

## 2021-08-16 DIAGNOSIS — F339 Major depressive disorder, recurrent, unspecified: Secondary | ICD-10-CM | POA: Diagnosis not present

## 2021-08-16 DIAGNOSIS — M25531 Pain in right wrist: Secondary | ICD-10-CM | POA: Diagnosis not present

## 2021-08-16 DIAGNOSIS — Z789 Other specified health status: Secondary | ICD-10-CM | POA: Diagnosis not present

## 2021-08-16 DIAGNOSIS — Z6836 Body mass index (BMI) 36.0-36.9, adult: Secondary | ICD-10-CM | POA: Diagnosis not present

## 2021-08-16 DIAGNOSIS — I10 Essential (primary) hypertension: Secondary | ICD-10-CM | POA: Diagnosis not present

## 2021-08-16 DIAGNOSIS — R32 Unspecified urinary incontinence: Secondary | ICD-10-CM | POA: Diagnosis not present

## 2021-08-16 DIAGNOSIS — Z299 Encounter for prophylactic measures, unspecified: Secondary | ICD-10-CM | POA: Diagnosis not present

## 2021-09-02 IMAGING — CT CT HEAD W/O CM
4 series · 17 of 47 positions shown, 19 images · non-contrast
Comparison: Yesterday

CLINICAL DATA: Follow-up subdural hemorrhage

EXAM:
CT HEAD WITHOUT CONTRAST
TECHNIQUE: Contiguous axial images were obtained from the base of the skull
through the vertex without intravenous contrast.

[Series 3: head without · axial · non-contrast · 0.46mm/px · z∈[-127,-7]mm · 7 of 34 slices shown, 9 images]
[im 5/34  brain]
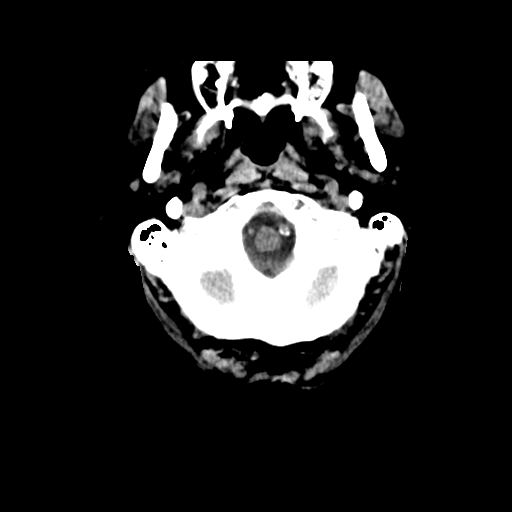
[im 5/34  bone]
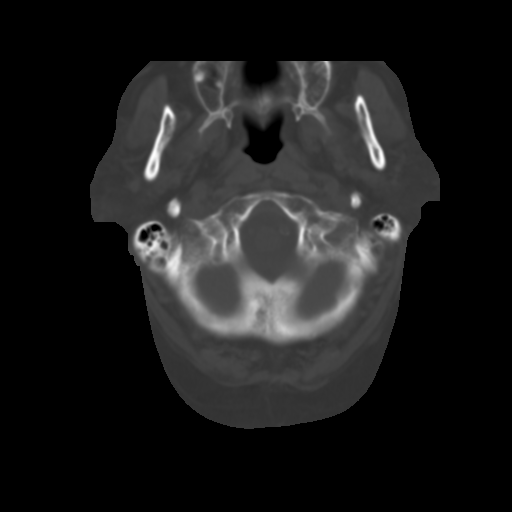
[im 9/34  brain]
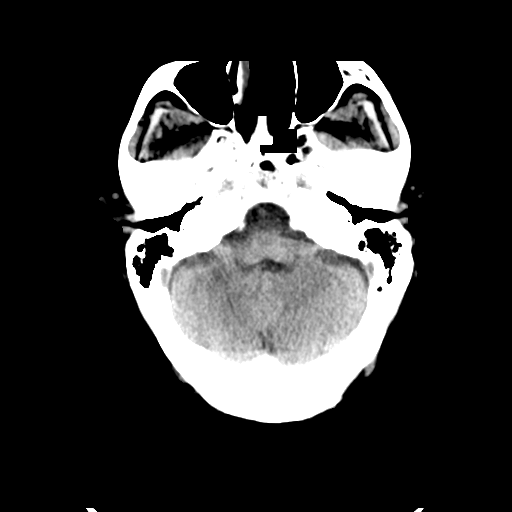
[im 13/34  brain]
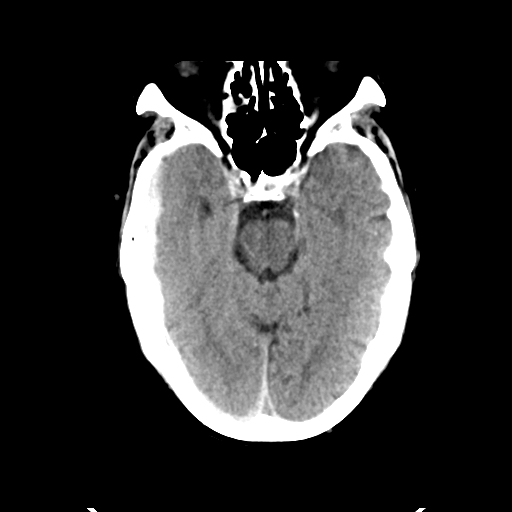
[im 17/34  brain]
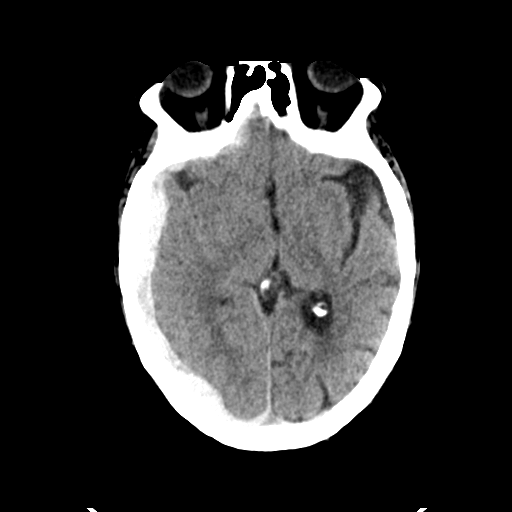
[im 21/34  brain]
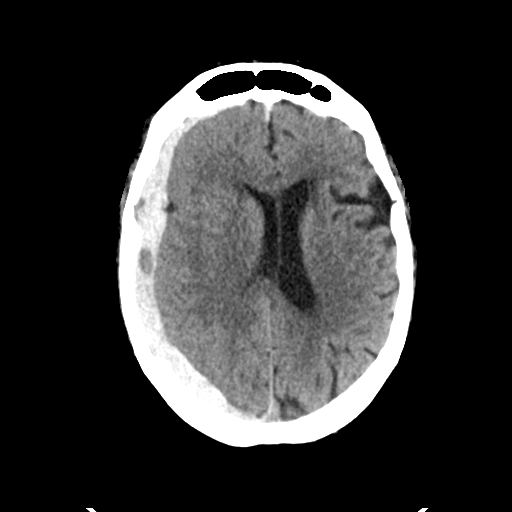
[im 21/34  bone]
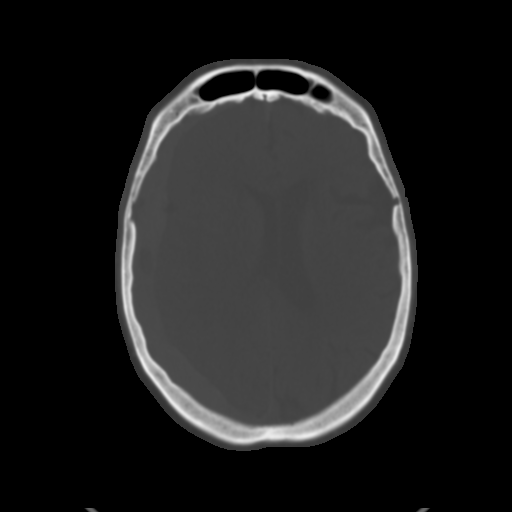
[im 25/34  brain]
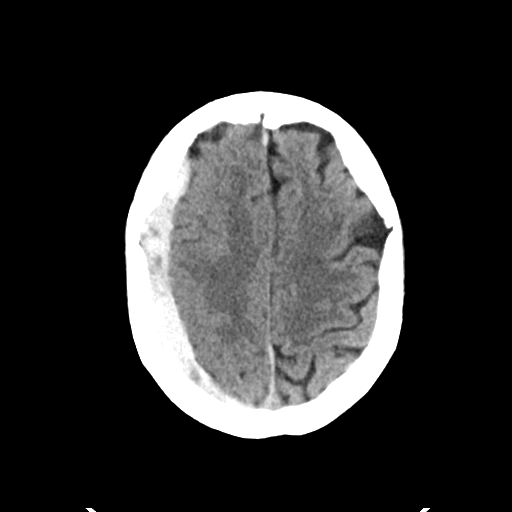
[im 29/34  brain]
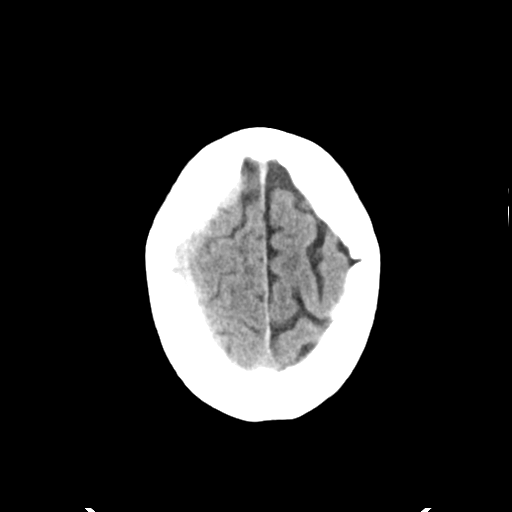

[Series 4: head bone · axial · 0.46mm/px · z∈[-131,-73]mm · 4 of 85 slices shown]
[im 9/85  bone]
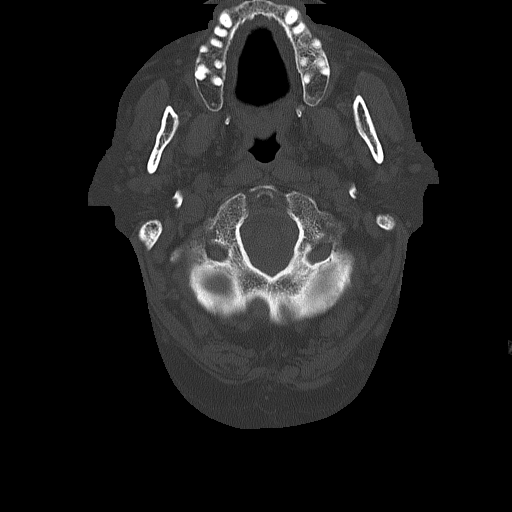
[im 17/85  bone]
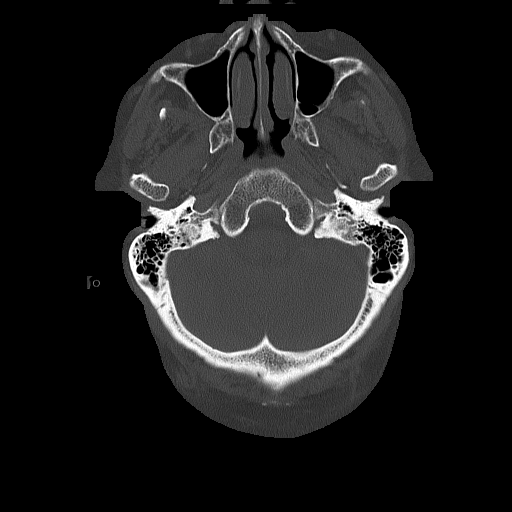
[im 26/85  bone]
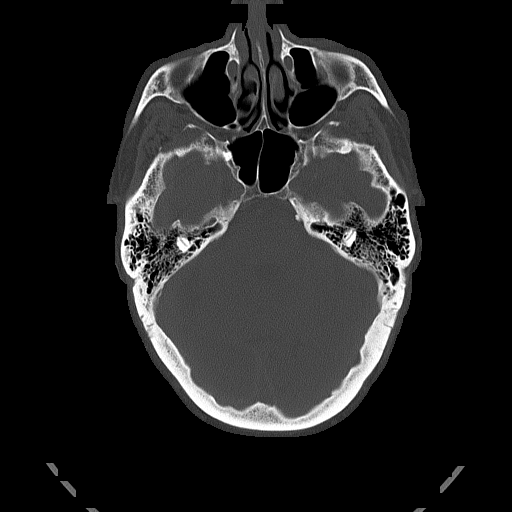
[im 38/85  bone]
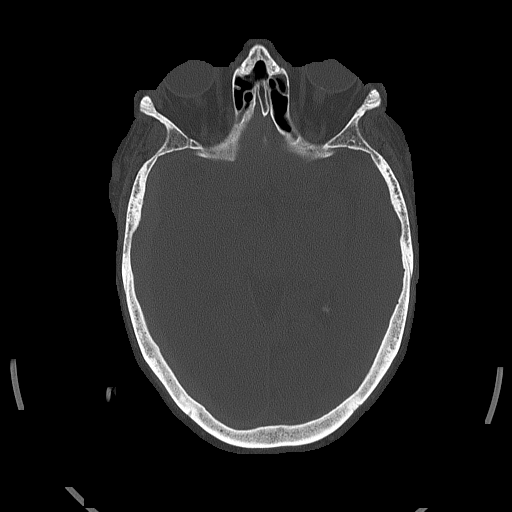

[Series 5: head without cor · coronal · non-contrast · 0.35mm/px · 3 of 71 slices shown]
[im 24/71  brain]
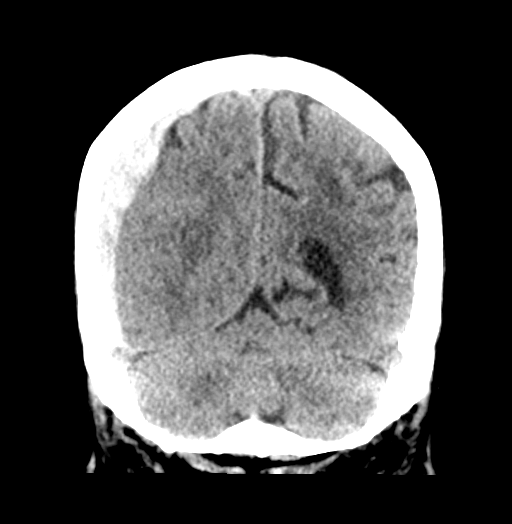
[im 32/71  brain]
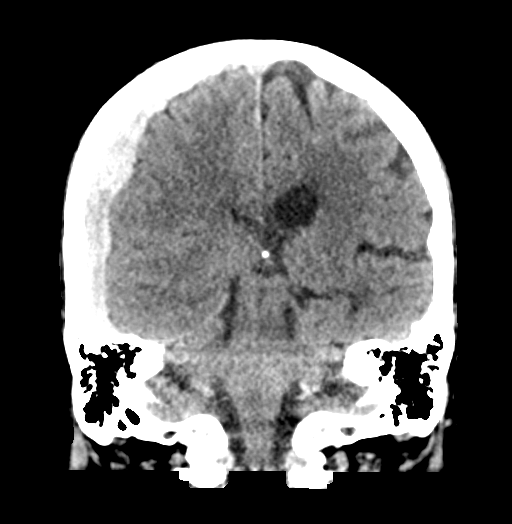
[im 39/71  brain]
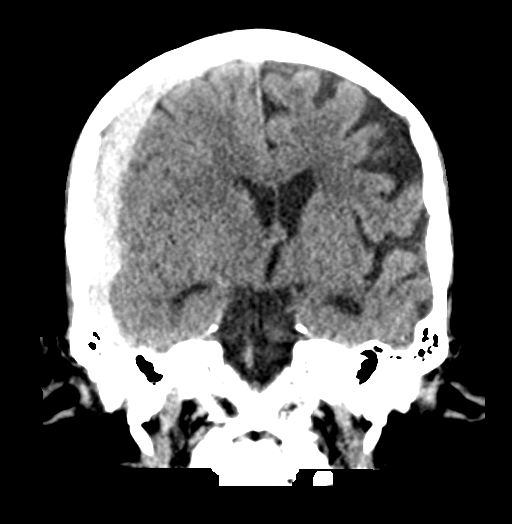

[Series 6: head without sag · sagittal · non-contrast · 0.36mm/px · 3 of 56 slices shown]
[im 19/56  brain]
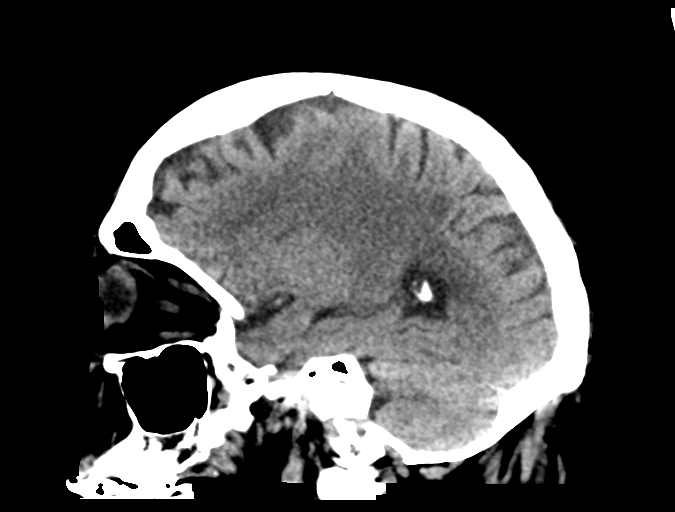
[im 28/56  brain]
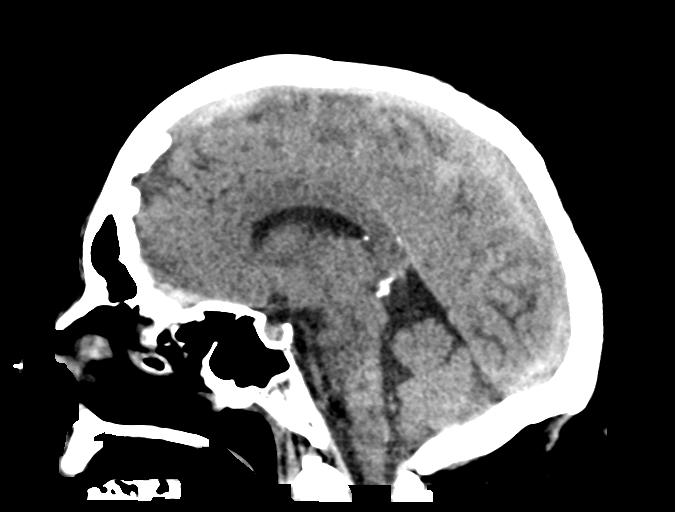
[im 37/56  brain]
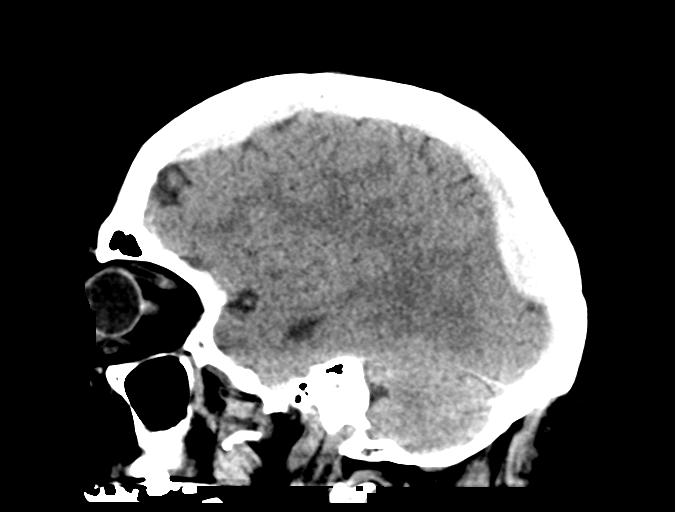

[17 of 47 positions shown; findings below may reference images not displayed]

FINDINGS: Brain: High-density subdural hematoma along the right cerebral
convexity that measures up to 14 mm in thickness at the lateral
frontal lobe. Minimal subdural hemorrhage is seen along the falx. No
progression of hemorrhage. Leftward midline shift measures 6 mm as
measured on coronal reformats. No entrapment or infarct. No new site
of hemorrhage seen.

Vascular: Negative

Skull: No acute fracture.

Sinuses/Orbits: Bilateral cataract resection.
IMPRESSION: Unchanged subdural hematoma on the right with up to 6 mm of midline
shift.

## 2021-09-25 ENCOUNTER — Ambulatory Visit (INDEPENDENT_AMBULATORY_CARE_PROVIDER_SITE_OTHER): Payer: Medicare HMO | Admitting: Podiatry

## 2021-09-25 DIAGNOSIS — G609 Hereditary and idiopathic neuropathy, unspecified: Secondary | ICD-10-CM | POA: Diagnosis not present

## 2021-09-25 DIAGNOSIS — E039 Hypothyroidism, unspecified: Secondary | ICD-10-CM | POA: Diagnosis not present

## 2021-09-25 DIAGNOSIS — M2042 Other hammer toe(s) (acquired), left foot: Secondary | ICD-10-CM

## 2021-09-25 DIAGNOSIS — B351 Tinea unguium: Secondary | ICD-10-CM | POA: Diagnosis not present

## 2021-09-25 DIAGNOSIS — N183 Chronic kidney disease, stage 3 unspecified: Secondary | ICD-10-CM

## 2021-09-25 DIAGNOSIS — M2041 Other hammer toe(s) (acquired), right foot: Secondary | ICD-10-CM

## 2021-09-25 DIAGNOSIS — M797 Fibromyalgia: Secondary | ICD-10-CM | POA: Diagnosis not present

## 2021-09-25 DIAGNOSIS — S52531D Colles' fracture of right radius, subsequent encounter for closed fracture with routine healing: Secondary | ICD-10-CM | POA: Diagnosis not present

## 2021-09-25 NOTE — Progress Notes (Signed)
This patient returns to my office for at risk foot care.  This patient requires this care by a professional since this patient will be at risk due to having  CKD.   This patient is unable to cut nails himself since the patient cannot reach his nails.These nails are painful walking and wearing shoes.  This patient presents for at risk foot care today.  General Appearance  Alert, conversant and in no acute stress.  Vascular  Dorsalis pedis and posterior tibial  pulses are weakly  palpable  bilaterally.  Capillary return is within normal limits  bilaterally. Temperature is within normal limits  bilaterally.  Neurologic  Senn-Weinstein monofilament wire test within normal limits  bilaterally. Muscle power within normal limits bilaterally.  Nails Thick disfigured discolored nails with subungual debris  from hallux to fifth toes bilaterally. No evidence of bacterial infection or drainage bilaterally.  Orthopedic  No limitations of motion  feet .  No crepitus or effusions noted.  No bony pathology .  Rigid hammer toes  B/L.  Skin  normotropic skin with no porokeratosis noted bilaterally.  No signs of infections or ulcers noted.     Onychomycosis  Pain in right toes  Pain in left toes  Hammer toes  B/l.  Consent was obtained for treatment procedures.   Mechanical debridement of nails 1-5  bilaterally performed with a nail nipper.  Filed with dremel without incident. Discussed hammer toes with patient and dispensed crest pads both feet.   Return office visit   4 months                   Told patient to return for periodic foot care and evaluation due to potential at risk complications.   Hannah Crill DPM   

## 2021-10-08 DIAGNOSIS — W010XXA Fall on same level from slipping, tripping and stumbling without subsequent striking against object, initial encounter: Secondary | ICD-10-CM | POA: Diagnosis not present

## 2021-10-08 DIAGNOSIS — F319 Bipolar disorder, unspecified: Secondary | ICD-10-CM | POA: Diagnosis not present

## 2021-10-08 DIAGNOSIS — Z885 Allergy status to narcotic agent status: Secondary | ICD-10-CM | POA: Diagnosis not present

## 2021-10-08 DIAGNOSIS — M25531 Pain in right wrist: Secondary | ICD-10-CM | POA: Diagnosis not present

## 2021-10-08 DIAGNOSIS — S52614A Nondisplaced fracture of right ulna styloid process, initial encounter for closed fracture: Secondary | ICD-10-CM | POA: Diagnosis not present

## 2021-10-08 DIAGNOSIS — M25521 Pain in right elbow: Secondary | ICD-10-CM | POA: Diagnosis not present

## 2021-10-08 DIAGNOSIS — Z79899 Other long term (current) drug therapy: Secondary | ICD-10-CM | POA: Diagnosis not present

## 2021-10-08 DIAGNOSIS — Z79890 Hormone replacement therapy (postmenopausal): Secondary | ICD-10-CM | POA: Diagnosis not present

## 2021-10-08 DIAGNOSIS — Z87891 Personal history of nicotine dependence: Secondary | ICD-10-CM | POA: Diagnosis not present

## 2021-10-08 DIAGNOSIS — E079 Disorder of thyroid, unspecified: Secondary | ICD-10-CM | POA: Diagnosis not present

## 2021-11-20 DIAGNOSIS — Z6835 Body mass index (BMI) 35.0-35.9, adult: Secondary | ICD-10-CM | POA: Diagnosis not present

## 2021-11-20 DIAGNOSIS — M545 Low back pain, unspecified: Secondary | ICD-10-CM | POA: Diagnosis not present

## 2021-11-20 DIAGNOSIS — I10 Essential (primary) hypertension: Secondary | ICD-10-CM | POA: Diagnosis not present

## 2021-11-20 DIAGNOSIS — F319 Bipolar disorder, unspecified: Secondary | ICD-10-CM | POA: Diagnosis not present

## 2021-11-20 DIAGNOSIS — G43909 Migraine, unspecified, not intractable, without status migrainosus: Secondary | ICD-10-CM | POA: Diagnosis not present

## 2021-11-20 DIAGNOSIS — Z299 Encounter for prophylactic measures, unspecified: Secondary | ICD-10-CM | POA: Diagnosis not present

## 2021-12-06 DIAGNOSIS — Z791 Long term (current) use of non-steroidal anti-inflammatories (NSAID): Secondary | ICD-10-CM | POA: Diagnosis not present

## 2021-12-06 DIAGNOSIS — Z888 Allergy status to other drugs, medicaments and biological substances status: Secondary | ICD-10-CM | POA: Diagnosis not present

## 2021-12-06 DIAGNOSIS — F319 Bipolar disorder, unspecified: Secondary | ICD-10-CM | POA: Diagnosis not present

## 2021-12-06 DIAGNOSIS — Z885 Allergy status to narcotic agent status: Secondary | ICD-10-CM | POA: Diagnosis not present

## 2021-12-06 DIAGNOSIS — Z7401 Bed confinement status: Secondary | ICD-10-CM | POA: Diagnosis not present

## 2021-12-06 DIAGNOSIS — E039 Hypothyroidism, unspecified: Secondary | ICD-10-CM | POA: Diagnosis not present

## 2021-12-06 DIAGNOSIS — M170 Bilateral primary osteoarthritis of knee: Secondary | ICD-10-CM | POA: Diagnosis not present

## 2021-12-06 DIAGNOSIS — M25561 Pain in right knee: Secondary | ICD-10-CM | POA: Diagnosis not present

## 2021-12-06 DIAGNOSIS — I959 Hypotension, unspecified: Secondary | ICD-10-CM | POA: Diagnosis not present

## 2021-12-06 DIAGNOSIS — Z79899 Other long term (current) drug therapy: Secondary | ICD-10-CM | POA: Diagnosis not present

## 2021-12-06 DIAGNOSIS — M25562 Pain in left knee: Secondary | ICD-10-CM | POA: Diagnosis not present

## 2021-12-11 DIAGNOSIS — T1490XA Injury, unspecified, initial encounter: Secondary | ICD-10-CM | POA: Diagnosis not present

## 2021-12-15 DIAGNOSIS — I959 Hypotension, unspecified: Secondary | ICD-10-CM | POA: Diagnosis not present

## 2021-12-25 DIAGNOSIS — S6981XA Other specified injuries of right wrist, hand and finger(s), initial encounter: Secondary | ICD-10-CM | POA: Diagnosis not present

## 2021-12-25 DIAGNOSIS — M25531 Pain in right wrist: Secondary | ICD-10-CM | POA: Diagnosis not present

## 2021-12-25 DIAGNOSIS — S52501D Unspecified fracture of the lower end of right radius, subsequent encounter for closed fracture with routine healing: Secondary | ICD-10-CM | POA: Diagnosis not present

## 2021-12-26 DIAGNOSIS — R52 Pain, unspecified: Secondary | ICD-10-CM | POA: Diagnosis not present

## 2021-12-27 ENCOUNTER — Other Ambulatory Visit: Payer: Self-pay | Admitting: Internal Medicine

## 2021-12-27 DIAGNOSIS — Z1231 Encounter for screening mammogram for malignant neoplasm of breast: Secondary | ICD-10-CM

## 2021-12-31 DIAGNOSIS — S329XXA Fracture of unspecified parts of lumbosacral spine and pelvis, initial encounter for closed fracture: Secondary | ICD-10-CM | POA: Diagnosis not present

## 2021-12-31 DIAGNOSIS — E860 Dehydration: Secondary | ICD-10-CM | POA: Diagnosis not present

## 2021-12-31 DIAGNOSIS — N390 Urinary tract infection, site not specified: Secondary | ICD-10-CM | POA: Diagnosis not present

## 2021-12-31 DIAGNOSIS — M797 Fibromyalgia: Secondary | ICD-10-CM | POA: Diagnosis not present

## 2021-12-31 DIAGNOSIS — R296 Repeated falls: Secondary | ICD-10-CM | POA: Diagnosis not present

## 2021-12-31 DIAGNOSIS — R079 Chest pain, unspecified: Secondary | ICD-10-CM | POA: Diagnosis not present

## 2021-12-31 DIAGNOSIS — Z6841 Body Mass Index (BMI) 40.0 and over, adult: Secondary | ICD-10-CM | POA: Diagnosis not present

## 2021-12-31 DIAGNOSIS — Z87891 Personal history of nicotine dependence: Secondary | ICD-10-CM | POA: Diagnosis not present

## 2021-12-31 DIAGNOSIS — W1839XA Other fall on same level, initial encounter: Secondary | ICD-10-CM | POA: Diagnosis not present

## 2021-12-31 DIAGNOSIS — R519 Headache, unspecified: Secondary | ICD-10-CM | POA: Diagnosis not present

## 2021-12-31 DIAGNOSIS — M419 Scoliosis, unspecified: Secondary | ICD-10-CM | POA: Diagnosis not present

## 2021-12-31 DIAGNOSIS — M542 Cervicalgia: Secondary | ICD-10-CM | POA: Diagnosis not present

## 2021-12-31 DIAGNOSIS — S2231XA Fracture of one rib, right side, initial encounter for closed fracture: Secondary | ICD-10-CM | POA: Diagnosis not present

## 2021-12-31 DIAGNOSIS — M79606 Pain in leg, unspecified: Secondary | ICD-10-CM | POA: Diagnosis not present

## 2021-12-31 DIAGNOSIS — N183 Chronic kidney disease, stage 3 unspecified: Secondary | ICD-10-CM | POA: Diagnosis not present

## 2021-12-31 DIAGNOSIS — W19XXXA Unspecified fall, initial encounter: Secondary | ICD-10-CM | POA: Diagnosis not present

## 2021-12-31 DIAGNOSIS — R102 Pelvic and perineal pain: Secondary | ICD-10-CM | POA: Diagnosis not present

## 2021-12-31 DIAGNOSIS — R609 Edema, unspecified: Secondary | ICD-10-CM | POA: Diagnosis not present

## 2021-12-31 DIAGNOSIS — M4312 Spondylolisthesis, cervical region: Secondary | ICD-10-CM | POA: Diagnosis not present

## 2021-12-31 DIAGNOSIS — S0990XA Unspecified injury of head, initial encounter: Secondary | ICD-10-CM | POA: Diagnosis not present

## 2021-12-31 DIAGNOSIS — F319 Bipolar disorder, unspecified: Secondary | ICD-10-CM | POA: Diagnosis not present

## 2022-01-03 DIAGNOSIS — M25561 Pain in right knee: Secondary | ICD-10-CM | POA: Diagnosis not present

## 2022-01-03 DIAGNOSIS — M25562 Pain in left knee: Secondary | ICD-10-CM | POA: Diagnosis not present

## 2022-01-03 DIAGNOSIS — M170 Bilateral primary osteoarthritis of knee: Secondary | ICD-10-CM | POA: Diagnosis not present

## 2022-01-08 DIAGNOSIS — Z7189 Other specified counseling: Secondary | ICD-10-CM | POA: Diagnosis not present

## 2022-01-08 DIAGNOSIS — Z299 Encounter for prophylactic measures, unspecified: Secondary | ICD-10-CM | POA: Diagnosis not present

## 2022-01-08 DIAGNOSIS — M25569 Pain in unspecified knee: Secondary | ICD-10-CM | POA: Diagnosis not present

## 2022-01-08 DIAGNOSIS — Z1339 Encounter for screening examination for other mental health and behavioral disorders: Secondary | ICD-10-CM | POA: Diagnosis not present

## 2022-01-08 DIAGNOSIS — J449 Chronic obstructive pulmonary disease, unspecified: Secondary | ICD-10-CM | POA: Diagnosis not present

## 2022-01-08 DIAGNOSIS — Z0000 Encounter for general adult medical examination without abnormal findings: Secondary | ICD-10-CM | POA: Diagnosis not present

## 2022-01-08 DIAGNOSIS — I10 Essential (primary) hypertension: Secondary | ICD-10-CM | POA: Diagnosis not present

## 2022-01-08 DIAGNOSIS — Z1331 Encounter for screening for depression: Secondary | ICD-10-CM | POA: Diagnosis not present

## 2022-01-08 DIAGNOSIS — Z6835 Body mass index (BMI) 35.0-35.9, adult: Secondary | ICD-10-CM | POA: Diagnosis not present

## 2022-01-14 DIAGNOSIS — R5381 Other malaise: Secondary | ICD-10-CM | POA: Diagnosis not present

## 2022-01-14 DIAGNOSIS — R400 Somnolence: Secondary | ICD-10-CM | POA: Diagnosis not present

## 2022-01-14 DIAGNOSIS — G4489 Other headache syndrome: Secondary | ICD-10-CM | POA: Diagnosis not present

## 2022-01-14 DIAGNOSIS — N182 Chronic kidney disease, stage 2 (mild): Secondary | ICD-10-CM | POA: Diagnosis not present

## 2022-01-14 DIAGNOSIS — E8729 Other acidosis: Secondary | ICD-10-CM | POA: Diagnosis not present

## 2022-01-14 DIAGNOSIS — J9602 Acute respiratory failure with hypercapnia: Secondary | ICD-10-CM | POA: Diagnosis not present

## 2022-01-14 DIAGNOSIS — I712 Thoracic aortic aneurysm, without rupture, unspecified: Secondary | ICD-10-CM | POA: Diagnosis not present

## 2022-01-14 DIAGNOSIS — R5383 Other fatigue: Secondary | ICD-10-CM | POA: Diagnosis not present

## 2022-01-14 DIAGNOSIS — R0902 Hypoxemia: Secondary | ICD-10-CM | POA: Diagnosis not present

## 2022-01-14 DIAGNOSIS — J9601 Acute respiratory failure with hypoxia: Secondary | ICD-10-CM | POA: Diagnosis not present

## 2022-01-14 DIAGNOSIS — I7121 Aneurysm of the ascending aorta, without rupture: Secondary | ICD-10-CM | POA: Diagnosis not present

## 2022-01-14 DIAGNOSIS — Z87891 Personal history of nicotine dependence: Secondary | ICD-10-CM | POA: Diagnosis not present

## 2022-01-14 DIAGNOSIS — N179 Acute kidney failure, unspecified: Secondary | ICD-10-CM | POA: Diagnosis not present

## 2022-01-14 DIAGNOSIS — G9341 Metabolic encephalopathy: Secondary | ICD-10-CM | POA: Diagnosis not present

## 2022-01-14 DIAGNOSIS — J9622 Acute and chronic respiratory failure with hypercapnia: Secondary | ICD-10-CM | POA: Diagnosis not present

## 2022-01-14 DIAGNOSIS — R0602 Shortness of breath: Secondary | ICD-10-CM | POA: Diagnosis not present

## 2022-01-14 DIAGNOSIS — J9621 Acute and chronic respiratory failure with hypoxia: Secondary | ICD-10-CM | POA: Diagnosis not present

## 2022-01-14 DIAGNOSIS — Z87448 Personal history of other diseases of urinary system: Secondary | ICD-10-CM | POA: Diagnosis not present

## 2022-01-14 DIAGNOSIS — F319 Bipolar disorder, unspecified: Secondary | ICD-10-CM | POA: Diagnosis not present

## 2022-01-14 DIAGNOSIS — Z8679 Personal history of other diseases of the circulatory system: Secondary | ICD-10-CM | POA: Diagnosis not present

## 2022-01-14 DIAGNOSIS — G928 Other toxic encephalopathy: Secondary | ICD-10-CM | POA: Diagnosis not present

## 2022-01-14 DIAGNOSIS — Z20822 Contact with and (suspected) exposure to covid-19: Secondary | ICD-10-CM | POA: Diagnosis not present

## 2022-01-14 DIAGNOSIS — T424X1A Poisoning by benzodiazepines, accidental (unintentional), initial encounter: Secondary | ICD-10-CM | POA: Diagnosis not present

## 2022-01-14 DIAGNOSIS — R7989 Other specified abnormal findings of blood chemistry: Secondary | ICD-10-CM | POA: Diagnosis not present

## 2022-01-14 DIAGNOSIS — R0689 Other abnormalities of breathing: Secondary | ICD-10-CM | POA: Diagnosis not present

## 2022-01-14 DIAGNOSIS — Z9989 Dependence on other enabling machines and devices: Secondary | ICD-10-CM | POA: Diagnosis not present

## 2022-01-14 DIAGNOSIS — R69 Illness, unspecified: Secondary | ICD-10-CM | POA: Diagnosis not present

## 2022-01-14 DIAGNOSIS — E7800 Pure hypercholesterolemia, unspecified: Secondary | ICD-10-CM | POA: Diagnosis not present

## 2022-01-14 DIAGNOSIS — E079 Disorder of thyroid, unspecified: Secondary | ICD-10-CM | POA: Diagnosis not present

## 2022-01-14 DIAGNOSIS — R4182 Altered mental status, unspecified: Secondary | ICD-10-CM | POA: Diagnosis not present

## 2022-01-15 ENCOUNTER — Inpatient Hospital Stay: Admission: RE | Admit: 2022-01-15 | Payer: Medicare Other | Source: Ambulatory Visit

## 2022-01-16 DIAGNOSIS — R69 Illness, unspecified: Secondary | ICD-10-CM | POA: Diagnosis not present

## 2022-01-16 DIAGNOSIS — R5381 Other malaise: Secondary | ICD-10-CM | POA: Diagnosis not present

## 2022-01-19 DIAGNOSIS — R531 Weakness: Secondary | ICD-10-CM | POA: Diagnosis not present

## 2022-01-23 DIAGNOSIS — I10 Essential (primary) hypertension: Secondary | ICD-10-CM | POA: Diagnosis not present

## 2022-01-23 DIAGNOSIS — R69 Illness, unspecified: Secondary | ICD-10-CM | POA: Diagnosis not present

## 2022-01-23 DIAGNOSIS — Z6835 Body mass index (BMI) 35.0-35.9, adult: Secondary | ICD-10-CM | POA: Diagnosis not present

## 2022-01-23 DIAGNOSIS — Z299 Encounter for prophylactic measures, unspecified: Secondary | ICD-10-CM | POA: Diagnosis not present

## 2022-01-23 DIAGNOSIS — R1310 Dysphagia, unspecified: Secondary | ICD-10-CM | POA: Diagnosis not present

## 2022-01-23 DIAGNOSIS — F319 Bipolar disorder, unspecified: Secondary | ICD-10-CM | POA: Diagnosis not present

## 2022-01-23 DIAGNOSIS — R5381 Other malaise: Secondary | ICD-10-CM | POA: Diagnosis not present

## 2022-01-31 ENCOUNTER — Ambulatory Visit: Payer: Medicare HMO | Admitting: Podiatry

## 2022-02-12 ENCOUNTER — Ambulatory Visit: Payer: Medicare HMO | Admitting: Podiatry

## 2022-02-13 ENCOUNTER — Ambulatory Visit
Admission: RE | Admit: 2022-02-13 | Discharge: 2022-02-13 | Disposition: A | Discharge status: Home / Self Care | Payer: Medicare HMO | Source: Ambulatory Visit | Attending: Internal Medicine | Admitting: Internal Medicine

## 2022-02-13 DIAGNOSIS — Z1231 Encounter for screening mammogram for malignant neoplasm of breast: Secondary | ICD-10-CM

## 2022-02-13 DIAGNOSIS — I10 Essential (primary) hypertension: Secondary | ICD-10-CM | POA: Diagnosis not present

## 2022-02-14 DIAGNOSIS — J9601 Acute respiratory failure with hypoxia: Secondary | ICD-10-CM | POA: Diagnosis not present

## 2022-02-14 DIAGNOSIS — I10 Essential (primary) hypertension: Secondary | ICD-10-CM | POA: Diagnosis not present

## 2022-02-14 DIAGNOSIS — E7800 Pure hypercholesterolemia, unspecified: Secondary | ICD-10-CM | POA: Diagnosis not present

## 2022-02-14 DIAGNOSIS — G629 Polyneuropathy, unspecified: Secondary | ICD-10-CM | POA: Diagnosis not present

## 2022-02-28 DIAGNOSIS — Z299 Encounter for prophylactic measures, unspecified: Secondary | ICD-10-CM | POA: Diagnosis not present

## 2022-02-28 DIAGNOSIS — I10 Essential (primary) hypertension: Secondary | ICD-10-CM | POA: Diagnosis not present

## 2022-02-28 DIAGNOSIS — M25569 Pain in unspecified knee: Secondary | ICD-10-CM | POA: Diagnosis not present

## 2022-02-28 DIAGNOSIS — K2100 Gastro-esophageal reflux disease with esophagitis, without bleeding: Secondary | ICD-10-CM | POA: Diagnosis not present

## 2022-02-28 DIAGNOSIS — F319 Bipolar disorder, unspecified: Secondary | ICD-10-CM | POA: Diagnosis not present

## 2022-03-05 ENCOUNTER — Ambulatory Visit (INDEPENDENT_AMBULATORY_CARE_PROVIDER_SITE_OTHER): Payer: Medicare HMO | Admitting: Podiatry

## 2022-03-05 ENCOUNTER — Encounter: Payer: Self-pay | Admitting: Podiatry

## 2022-03-05 DIAGNOSIS — N183 Chronic kidney disease, stage 3 unspecified: Secondary | ICD-10-CM | POA: Diagnosis not present

## 2022-03-05 DIAGNOSIS — B351 Tinea unguium: Secondary | ICD-10-CM | POA: Diagnosis not present

## 2022-03-05 DIAGNOSIS — M79676 Pain in unspecified toe(s): Secondary | ICD-10-CM

## 2022-03-05 NOTE — Progress Notes (Signed)
This patient returns to my office for at risk foot care.  This patient requires this care by a professional since this patient will be at risk due to having  CKD.   This patient is unable to cut nails himself since the patient cannot reach his nails.These nails are painful walking and wearing shoes.  This patient presents for at risk foot care today.  General Appearance  Alert, conversant and in no acute stress.  Vascular  Dorsalis pedis and posterior tibial  pulses are weakly  palpable  bilaterally.  Capillary return is within normal limits  bilaterally. Temperature is within normal limits  bilaterally.  Neurologic  Senn-Weinstein monofilament wire test within normal limits  bilaterally. Muscle power within normal limits bilaterally.  Nails Thick disfigured discolored nails with subungual debris  from hallux to fifth toes bilaterally. No evidence of bacterial infection or drainage bilaterally.  Orthopedic  No limitations of motion  feet .  No crepitus or effusions noted.  No bony pathology .  Rigid hammer toes  B/L.  Skin  normotropic skin with no porokeratosis noted bilaterally.  No signs of infections or ulcers noted.     Onychomycosis  Pain in right toes  Pain in left toes  Hammer toes  B/l.  Consent was obtained for treatment procedures.   Mechanical debridement of nails 1-5  bilaterally performed with a nail nipper.  Filed with dremel without incident. Discussed hammer toes with patient and dispensed crest pads both feet.   Return office visit   4 months                   Told patient to return for periodic foot care and evaluation due to potential at risk complications.   Gardiner Barefoot DPM

## 2022-03-06 DIAGNOSIS — R5381 Other malaise: Secondary | ICD-10-CM | POA: Diagnosis not present

## 2022-03-24 DIAGNOSIS — I700 Atherosclerosis of aorta: Secondary | ICD-10-CM | POA: Diagnosis not present

## 2022-03-24 DIAGNOSIS — E785 Hyperlipidemia, unspecified: Secondary | ICD-10-CM | POA: Diagnosis not present

## 2022-03-24 DIAGNOSIS — J449 Chronic obstructive pulmonary disease, unspecified: Secondary | ICD-10-CM | POA: Diagnosis not present

## 2022-03-24 DIAGNOSIS — Z79890 Hormone replacement therapy (postmenopausal): Secondary | ICD-10-CM | POA: Diagnosis not present

## 2022-03-24 DIAGNOSIS — R519 Headache, unspecified: Secondary | ICD-10-CM | POA: Diagnosis not present

## 2022-03-24 DIAGNOSIS — Z743 Need for continuous supervision: Secondary | ICD-10-CM | POA: Diagnosis not present

## 2022-03-24 DIAGNOSIS — W19XXXA Unspecified fall, initial encounter: Secondary | ICD-10-CM | POA: Diagnosis not present

## 2022-03-24 DIAGNOSIS — Z1152 Encounter for screening for COVID-19: Secondary | ICD-10-CM | POA: Diagnosis not present

## 2022-03-24 DIAGNOSIS — F419 Anxiety disorder, unspecified: Secondary | ICD-10-CM | POA: Diagnosis not present

## 2022-03-24 DIAGNOSIS — F319 Bipolar disorder, unspecified: Secondary | ICD-10-CM | POA: Diagnosis not present

## 2022-03-24 DIAGNOSIS — Z9981 Dependence on supplemental oxygen: Secondary | ICD-10-CM | POA: Diagnosis not present

## 2022-03-24 DIAGNOSIS — K2100 Gastro-esophageal reflux disease with esophagitis, without bleeding: Secondary | ICD-10-CM | POA: Diagnosis not present

## 2022-03-24 DIAGNOSIS — M4319 Spondylolisthesis, multiple sites in spine: Secondary | ICD-10-CM | POA: Diagnosis not present

## 2022-03-24 DIAGNOSIS — E7800 Pure hypercholesterolemia, unspecified: Secondary | ICD-10-CM | POA: Diagnosis not present

## 2022-03-24 DIAGNOSIS — R251 Tremor, unspecified: Secondary | ICD-10-CM | POA: Diagnosis not present

## 2022-03-24 DIAGNOSIS — Z87891 Personal history of nicotine dependence: Secondary | ICD-10-CM | POA: Diagnosis not present

## 2022-03-24 DIAGNOSIS — J9602 Acute respiratory failure with hypercapnia: Secondary | ICD-10-CM | POA: Diagnosis not present

## 2022-03-24 DIAGNOSIS — E669 Obesity, unspecified: Secondary | ICD-10-CM | POA: Diagnosis not present

## 2022-03-24 DIAGNOSIS — R110 Nausea: Secondary | ICD-10-CM | POA: Diagnosis not present

## 2022-03-24 DIAGNOSIS — J9601 Acute respiratory failure with hypoxia: Secondary | ICD-10-CM | POA: Diagnosis not present

## 2022-03-24 DIAGNOSIS — J45901 Unspecified asthma with (acute) exacerbation: Secondary | ICD-10-CM | POA: Diagnosis not present

## 2022-03-24 DIAGNOSIS — K219 Gastro-esophageal reflux disease without esophagitis: Secondary | ICD-10-CM | POA: Diagnosis not present

## 2022-03-24 DIAGNOSIS — J441 Chronic obstructive pulmonary disease with (acute) exacerbation: Secondary | ICD-10-CM | POA: Diagnosis not present

## 2022-03-24 DIAGNOSIS — E039 Hypothyroidism, unspecified: Secondary | ICD-10-CM | POA: Diagnosis not present

## 2022-03-24 DIAGNOSIS — R112 Nausea with vomiting, unspecified: Secondary | ICD-10-CM | POA: Diagnosis not present

## 2022-03-24 DIAGNOSIS — R079 Chest pain, unspecified: Secondary | ICD-10-CM | POA: Diagnosis not present

## 2022-03-24 DIAGNOSIS — E860 Dehydration: Secondary | ICD-10-CM | POA: Diagnosis not present

## 2022-03-24 DIAGNOSIS — R8271 Bacteriuria: Secondary | ICD-10-CM | POA: Diagnosis not present

## 2022-03-24 DIAGNOSIS — F32A Depression, unspecified: Secondary | ICD-10-CM | POA: Diagnosis not present

## 2022-03-24 DIAGNOSIS — R4182 Altered mental status, unspecified: Secondary | ICD-10-CM | POA: Diagnosis not present

## 2022-03-24 DIAGNOSIS — R1111 Vomiting without nausea: Secondary | ICD-10-CM | POA: Diagnosis not present

## 2022-03-24 DIAGNOSIS — G4733 Obstructive sleep apnea (adult) (pediatric): Secondary | ICD-10-CM | POA: Diagnosis not present

## 2022-03-24 DIAGNOSIS — M4699 Unspecified inflammatory spondylopathy, multiple sites in spine: Secondary | ICD-10-CM | POA: Diagnosis not present

## 2022-03-24 DIAGNOSIS — M542 Cervicalgia: Secondary | ICD-10-CM | POA: Diagnosis not present

## 2022-03-24 DIAGNOSIS — M5126 Other intervertebral disc displacement, lumbar region: Secondary | ICD-10-CM | POA: Diagnosis not present

## 2022-03-24 DIAGNOSIS — I959 Hypotension, unspecified: Secondary | ICD-10-CM | POA: Diagnosis not present

## 2022-03-24 DIAGNOSIS — R531 Weakness: Secondary | ICD-10-CM | POA: Diagnosis not present

## 2022-03-24 DIAGNOSIS — R0902 Hypoxemia: Secondary | ICD-10-CM | POA: Diagnosis not present

## 2022-03-25 DIAGNOSIS — I959 Hypotension, unspecified: Secondary | ICD-10-CM | POA: Diagnosis not present

## 2022-03-25 DIAGNOSIS — R531 Weakness: Secondary | ICD-10-CM | POA: Diagnosis not present

## 2022-03-25 DIAGNOSIS — R519 Headache, unspecified: Secondary | ICD-10-CM | POA: Diagnosis not present

## 2022-03-25 DIAGNOSIS — W19XXXA Unspecified fall, initial encounter: Secondary | ICD-10-CM | POA: Diagnosis not present

## 2022-03-25 DIAGNOSIS — I700 Atherosclerosis of aorta: Secondary | ICD-10-CM | POA: Diagnosis not present

## 2022-03-25 DIAGNOSIS — M542 Cervicalgia: Secondary | ICD-10-CM | POA: Diagnosis not present

## 2022-03-25 DIAGNOSIS — J441 Chronic obstructive pulmonary disease with (acute) exacerbation: Secondary | ICD-10-CM | POA: Diagnosis not present

## 2022-03-25 DIAGNOSIS — Z87891 Personal history of nicotine dependence: Secondary | ICD-10-CM | POA: Diagnosis not present

## 2022-03-25 DIAGNOSIS — R251 Tremor, unspecified: Secondary | ICD-10-CM | POA: Diagnosis not present

## 2022-03-25 DIAGNOSIS — M4319 Spondylolisthesis, multiple sites in spine: Secondary | ICD-10-CM | POA: Diagnosis not present

## 2022-03-25 DIAGNOSIS — M5126 Other intervertebral disc displacement, lumbar region: Secondary | ICD-10-CM | POA: Diagnosis not present

## 2022-03-25 DIAGNOSIS — R0902 Hypoxemia: Secondary | ICD-10-CM | POA: Diagnosis not present

## 2022-03-25 DIAGNOSIS — R079 Chest pain, unspecified: Secondary | ICD-10-CM | POA: Diagnosis not present

## 2022-03-25 DIAGNOSIS — M4699 Unspecified inflammatory spondylopathy, multiple sites in spine: Secondary | ICD-10-CM | POA: Diagnosis not present

## 2022-03-26 DIAGNOSIS — Z9981 Dependence on supplemental oxygen: Secondary | ICD-10-CM | POA: Diagnosis not present

## 2022-03-26 DIAGNOSIS — J9602 Acute respiratory failure with hypercapnia: Secondary | ICD-10-CM | POA: Diagnosis not present

## 2022-03-26 DIAGNOSIS — J9601 Acute respiratory failure with hypoxia: Secondary | ICD-10-CM | POA: Diagnosis not present

## 2022-03-26 DIAGNOSIS — J45901 Unspecified asthma with (acute) exacerbation: Secondary | ICD-10-CM | POA: Diagnosis not present

## 2022-03-26 DIAGNOSIS — E039 Hypothyroidism, unspecified: Secondary | ICD-10-CM | POA: Diagnosis not present

## 2022-03-26 DIAGNOSIS — Z79890 Hormone replacement therapy (postmenopausal): Secondary | ICD-10-CM | POA: Diagnosis not present

## 2022-03-27 DIAGNOSIS — J449 Chronic obstructive pulmonary disease, unspecified: Secondary | ICD-10-CM | POA: Diagnosis not present

## 2022-03-27 DIAGNOSIS — Z9981 Dependence on supplemental oxygen: Secondary | ICD-10-CM | POA: Diagnosis not present

## 2022-03-27 DIAGNOSIS — F319 Bipolar disorder, unspecified: Secondary | ICD-10-CM | POA: Diagnosis not present

## 2022-03-27 DIAGNOSIS — F419 Anxiety disorder, unspecified: Secondary | ICD-10-CM | POA: Diagnosis not present

## 2022-03-27 DIAGNOSIS — J9601 Acute respiratory failure with hypoxia: Secondary | ICD-10-CM | POA: Diagnosis not present

## 2022-03-27 DIAGNOSIS — E039 Hypothyroidism, unspecified: Secondary | ICD-10-CM | POA: Diagnosis not present

## 2022-03-27 DIAGNOSIS — G4733 Obstructive sleep apnea (adult) (pediatric): Secondary | ICD-10-CM | POA: Diagnosis not present

## 2022-03-27 DIAGNOSIS — K219 Gastro-esophageal reflux disease without esophagitis: Secondary | ICD-10-CM | POA: Diagnosis not present

## 2022-03-27 DIAGNOSIS — J9602 Acute respiratory failure with hypercapnia: Secondary | ICD-10-CM | POA: Diagnosis not present

## 2022-03-28 DIAGNOSIS — J441 Chronic obstructive pulmonary disease with (acute) exacerbation: Secondary | ICD-10-CM | POA: Diagnosis not present

## 2022-03-28 DIAGNOSIS — R8271 Bacteriuria: Secondary | ICD-10-CM | POA: Diagnosis not present

## 2022-03-28 DIAGNOSIS — J9602 Acute respiratory failure with hypercapnia: Secondary | ICD-10-CM | POA: Diagnosis not present

## 2022-03-28 DIAGNOSIS — F319 Bipolar disorder, unspecified: Secondary | ICD-10-CM | POA: Diagnosis not present

## 2022-03-28 DIAGNOSIS — K219 Gastro-esophageal reflux disease without esophagitis: Secondary | ICD-10-CM | POA: Diagnosis not present

## 2022-03-28 DIAGNOSIS — G4733 Obstructive sleep apnea (adult) (pediatric): Secondary | ICD-10-CM | POA: Diagnosis not present

## 2022-03-28 DIAGNOSIS — J9601 Acute respiratory failure with hypoxia: Secondary | ICD-10-CM | POA: Diagnosis not present

## 2022-03-28 DIAGNOSIS — E039 Hypothyroidism, unspecified: Secondary | ICD-10-CM | POA: Diagnosis not present

## 2022-03-28 DIAGNOSIS — F419 Anxiety disorder, unspecified: Secondary | ICD-10-CM | POA: Diagnosis not present

## 2022-03-29 DIAGNOSIS — J9601 Acute respiratory failure with hypoxia: Secondary | ICD-10-CM | POA: Diagnosis not present

## 2022-03-29 DIAGNOSIS — J9602 Acute respiratory failure with hypercapnia: Secondary | ICD-10-CM | POA: Diagnosis not present

## 2022-03-29 DIAGNOSIS — E039 Hypothyroidism, unspecified: Secondary | ICD-10-CM | POA: Diagnosis not present

## 2022-03-29 DIAGNOSIS — K2100 Gastro-esophageal reflux disease with esophagitis, without bleeding: Secondary | ICD-10-CM | POA: Diagnosis not present

## 2022-03-29 DIAGNOSIS — F32A Depression, unspecified: Secondary | ICD-10-CM | POA: Diagnosis not present

## 2022-03-29 DIAGNOSIS — J449 Chronic obstructive pulmonary disease, unspecified: Secondary | ICD-10-CM | POA: Diagnosis not present

## 2022-03-29 DIAGNOSIS — F419 Anxiety disorder, unspecified: Secondary | ICD-10-CM | POA: Diagnosis not present

## 2022-03-29 DIAGNOSIS — G4733 Obstructive sleep apnea (adult) (pediatric): Secondary | ICD-10-CM | POA: Diagnosis not present

## 2022-03-29 DIAGNOSIS — E7800 Pure hypercholesterolemia, unspecified: Secondary | ICD-10-CM | POA: Diagnosis not present

## 2022-04-02 ENCOUNTER — Telehealth: Payer: Self-pay

## 2022-04-02 NOTE — Telephone Encounter (Signed)
Received sleep referral from [facility] based off of recent hospitalization. Hospital records in care everywhere. Placed in sleep mailbox

## 2022-04-06 DIAGNOSIS — J441 Chronic obstructive pulmonary disease with (acute) exacerbation: Secondary | ICD-10-CM | POA: Diagnosis not present

## 2022-04-06 DIAGNOSIS — J9601 Acute respiratory failure with hypoxia: Secondary | ICD-10-CM | POA: Diagnosis not present

## 2022-04-06 DIAGNOSIS — I10 Essential (primary) hypertension: Secondary | ICD-10-CM | POA: Diagnosis not present

## 2022-04-06 DIAGNOSIS — F319 Bipolar disorder, unspecified: Secondary | ICD-10-CM | POA: Diagnosis not present

## 2022-04-06 DIAGNOSIS — J9602 Acute respiratory failure with hypercapnia: Secondary | ICD-10-CM | POA: Diagnosis not present

## 2022-04-06 DIAGNOSIS — B9689 Other specified bacterial agents as the cause of diseases classified elsewhere: Secondary | ICD-10-CM | POA: Diagnosis not present

## 2022-04-06 DIAGNOSIS — F419 Anxiety disorder, unspecified: Secondary | ICD-10-CM | POA: Diagnosis not present

## 2022-04-06 DIAGNOSIS — E039 Hypothyroidism, unspecified: Secondary | ICD-10-CM | POA: Diagnosis not present

## 2022-04-06 DIAGNOSIS — N390 Urinary tract infection, site not specified: Secondary | ICD-10-CM | POA: Diagnosis not present

## 2022-04-11 DIAGNOSIS — J9610 Chronic respiratory failure, unspecified whether with hypoxia or hypercapnia: Secondary | ICD-10-CM | POA: Diagnosis not present

## 2022-04-11 DIAGNOSIS — F319 Bipolar disorder, unspecified: Secondary | ICD-10-CM | POA: Diagnosis not present

## 2022-04-11 DIAGNOSIS — I10 Essential (primary) hypertension: Secondary | ICD-10-CM | POA: Diagnosis not present

## 2022-04-11 DIAGNOSIS — Z299 Encounter for prophylactic measures, unspecified: Secondary | ICD-10-CM | POA: Diagnosis not present

## 2022-04-11 DIAGNOSIS — Z6834 Body mass index (BMI) 34.0-34.9, adult: Secondary | ICD-10-CM | POA: Diagnosis not present

## 2022-04-14 DIAGNOSIS — W19XXXA Unspecified fall, initial encounter: Secondary | ICD-10-CM | POA: Diagnosis not present

## 2022-04-14 DIAGNOSIS — T148XXA Other injury of unspecified body region, initial encounter: Secondary | ICD-10-CM | POA: Diagnosis not present

## 2022-04-14 DIAGNOSIS — R531 Weakness: Secondary | ICD-10-CM | POA: Diagnosis not present

## 2022-04-14 DIAGNOSIS — R5381 Other malaise: Secondary | ICD-10-CM | POA: Diagnosis not present

## 2022-04-16 DIAGNOSIS — M170 Bilateral primary osteoarthritis of knee: Secondary | ICD-10-CM | POA: Diagnosis not present

## 2022-04-20 DIAGNOSIS — J9602 Acute respiratory failure with hypercapnia: Secondary | ICD-10-CM | POA: Diagnosis not present

## 2022-04-20 DIAGNOSIS — I10 Essential (primary) hypertension: Secondary | ICD-10-CM | POA: Diagnosis not present

## 2022-04-20 DIAGNOSIS — E039 Hypothyroidism, unspecified: Secondary | ICD-10-CM | POA: Diagnosis not present

## 2022-04-20 DIAGNOSIS — J9601 Acute respiratory failure with hypoxia: Secondary | ICD-10-CM | POA: Diagnosis not present

## 2022-04-20 DIAGNOSIS — N390 Urinary tract infection, site not specified: Secondary | ICD-10-CM | POA: Diagnosis not present

## 2022-04-20 DIAGNOSIS — J441 Chronic obstructive pulmonary disease with (acute) exacerbation: Secondary | ICD-10-CM | POA: Diagnosis not present

## 2022-04-20 DIAGNOSIS — F419 Anxiety disorder, unspecified: Secondary | ICD-10-CM | POA: Diagnosis not present

## 2022-04-20 DIAGNOSIS — B9689 Other specified bacterial agents as the cause of diseases classified elsewhere: Secondary | ICD-10-CM | POA: Diagnosis not present

## 2022-04-20 DIAGNOSIS — F319 Bipolar disorder, unspecified: Secondary | ICD-10-CM | POA: Diagnosis not present

## 2022-04-25 DIAGNOSIS — E039 Hypothyroidism, unspecified: Secondary | ICD-10-CM | POA: Diagnosis not present

## 2022-04-25 DIAGNOSIS — N390 Urinary tract infection, site not specified: Secondary | ICD-10-CM | POA: Diagnosis not present

## 2022-04-25 DIAGNOSIS — I10 Essential (primary) hypertension: Secondary | ICD-10-CM | POA: Diagnosis not present

## 2022-04-25 DIAGNOSIS — F319 Bipolar disorder, unspecified: Secondary | ICD-10-CM | POA: Diagnosis not present

## 2022-04-25 DIAGNOSIS — J9601 Acute respiratory failure with hypoxia: Secondary | ICD-10-CM | POA: Diagnosis not present

## 2022-04-25 DIAGNOSIS — F419 Anxiety disorder, unspecified: Secondary | ICD-10-CM | POA: Diagnosis not present

## 2022-04-25 DIAGNOSIS — J441 Chronic obstructive pulmonary disease with (acute) exacerbation: Secondary | ICD-10-CM | POA: Diagnosis not present

## 2022-04-25 DIAGNOSIS — B9689 Other specified bacterial agents as the cause of diseases classified elsewhere: Secondary | ICD-10-CM | POA: Diagnosis not present

## 2022-04-25 DIAGNOSIS — J9602 Acute respiratory failure with hypercapnia: Secondary | ICD-10-CM | POA: Diagnosis not present

## 2022-05-03 DIAGNOSIS — I10 Essential (primary) hypertension: Secondary | ICD-10-CM | POA: Diagnosis not present

## 2022-05-03 DIAGNOSIS — N390 Urinary tract infection, site not specified: Secondary | ICD-10-CM | POA: Diagnosis not present

## 2022-05-03 DIAGNOSIS — F419 Anxiety disorder, unspecified: Secondary | ICD-10-CM | POA: Diagnosis not present

## 2022-05-03 DIAGNOSIS — F319 Bipolar disorder, unspecified: Secondary | ICD-10-CM | POA: Diagnosis not present

## 2022-05-03 DIAGNOSIS — J9601 Acute respiratory failure with hypoxia: Secondary | ICD-10-CM | POA: Diagnosis not present

## 2022-05-03 DIAGNOSIS — J9602 Acute respiratory failure with hypercapnia: Secondary | ICD-10-CM | POA: Diagnosis not present

## 2022-05-03 DIAGNOSIS — E039 Hypothyroidism, unspecified: Secondary | ICD-10-CM | POA: Diagnosis not present

## 2022-05-03 DIAGNOSIS — B9689 Other specified bacterial agents as the cause of diseases classified elsewhere: Secondary | ICD-10-CM | POA: Diagnosis not present

## 2022-05-03 DIAGNOSIS — J441 Chronic obstructive pulmonary disease with (acute) exacerbation: Secondary | ICD-10-CM | POA: Diagnosis not present

## 2022-05-04 DIAGNOSIS — R0602 Shortness of breath: Secondary | ICD-10-CM | POA: Diagnosis not present

## 2022-05-04 DIAGNOSIS — E079 Disorder of thyroid, unspecified: Secondary | ICD-10-CM | POA: Diagnosis not present

## 2022-05-04 DIAGNOSIS — R55 Syncope and collapse: Secondary | ICD-10-CM | POA: Diagnosis not present

## 2022-05-04 DIAGNOSIS — K219 Gastro-esophageal reflux disease without esophagitis: Secondary | ICD-10-CM | POA: Diagnosis not present

## 2022-05-04 DIAGNOSIS — Z743 Need for continuous supervision: Secondary | ICD-10-CM | POA: Diagnosis not present

## 2022-05-04 DIAGNOSIS — F419 Anxiety disorder, unspecified: Secondary | ICD-10-CM | POA: Diagnosis not present

## 2022-05-04 DIAGNOSIS — R42 Dizziness and giddiness: Secondary | ICD-10-CM | POA: Diagnosis not present

## 2022-05-04 DIAGNOSIS — F319 Bipolar disorder, unspecified: Secondary | ICD-10-CM | POA: Diagnosis not present

## 2022-05-04 DIAGNOSIS — G250 Essential tremor: Secondary | ICD-10-CM | POA: Diagnosis not present

## 2022-05-04 DIAGNOSIS — E039 Hypothyroidism, unspecified: Secondary | ICD-10-CM | POA: Diagnosis not present

## 2022-05-04 DIAGNOSIS — W19XXXA Unspecified fall, initial encounter: Secondary | ICD-10-CM | POA: Diagnosis not present

## 2022-05-04 DIAGNOSIS — R5381 Other malaise: Secondary | ICD-10-CM | POA: Diagnosis not present

## 2022-05-04 DIAGNOSIS — W228XXA Striking against or struck by other objects, initial encounter: Secondary | ICD-10-CM | POA: Diagnosis not present

## 2022-05-04 DIAGNOSIS — Z885 Allergy status to narcotic agent status: Secondary | ICD-10-CM | POA: Diagnosis not present

## 2022-05-04 DIAGNOSIS — I959 Hypotension, unspecified: Secondary | ICD-10-CM | POA: Diagnosis not present

## 2022-05-04 DIAGNOSIS — T148XXA Other injury of unspecified body region, initial encounter: Secondary | ICD-10-CM | POA: Diagnosis not present

## 2022-05-06 DIAGNOSIS — N390 Urinary tract infection, site not specified: Secondary | ICD-10-CM | POA: Diagnosis not present

## 2022-05-06 DIAGNOSIS — E039 Hypothyroidism, unspecified: Secondary | ICD-10-CM | POA: Diagnosis not present

## 2022-05-06 DIAGNOSIS — F319 Bipolar disorder, unspecified: Secondary | ICD-10-CM | POA: Diagnosis not present

## 2022-05-06 DIAGNOSIS — J9601 Acute respiratory failure with hypoxia: Secondary | ICD-10-CM | POA: Diagnosis not present

## 2022-05-06 DIAGNOSIS — J9602 Acute respiratory failure with hypercapnia: Secondary | ICD-10-CM | POA: Diagnosis not present

## 2022-05-06 DIAGNOSIS — B9689 Other specified bacterial agents as the cause of diseases classified elsewhere: Secondary | ICD-10-CM | POA: Diagnosis not present

## 2022-05-06 DIAGNOSIS — F419 Anxiety disorder, unspecified: Secondary | ICD-10-CM | POA: Diagnosis not present

## 2022-05-06 DIAGNOSIS — I10 Essential (primary) hypertension: Secondary | ICD-10-CM | POA: Diagnosis not present

## 2022-05-06 DIAGNOSIS — J441 Chronic obstructive pulmonary disease with (acute) exacerbation: Secondary | ICD-10-CM | POA: Diagnosis not present

## 2022-05-10 DIAGNOSIS — J441 Chronic obstructive pulmonary disease with (acute) exacerbation: Secondary | ICD-10-CM | POA: Diagnosis not present

## 2022-05-10 DIAGNOSIS — B9689 Other specified bacterial agents as the cause of diseases classified elsewhere: Secondary | ICD-10-CM | POA: Diagnosis not present

## 2022-05-10 DIAGNOSIS — N390 Urinary tract infection, site not specified: Secondary | ICD-10-CM | POA: Diagnosis not present

## 2022-05-10 DIAGNOSIS — J9602 Acute respiratory failure with hypercapnia: Secondary | ICD-10-CM | POA: Diagnosis not present

## 2022-05-10 DIAGNOSIS — F419 Anxiety disorder, unspecified: Secondary | ICD-10-CM | POA: Diagnosis not present

## 2022-05-10 DIAGNOSIS — I10 Essential (primary) hypertension: Secondary | ICD-10-CM | POA: Diagnosis not present

## 2022-05-10 DIAGNOSIS — J9601 Acute respiratory failure with hypoxia: Secondary | ICD-10-CM | POA: Diagnosis not present

## 2022-05-10 DIAGNOSIS — E039 Hypothyroidism, unspecified: Secondary | ICD-10-CM | POA: Diagnosis not present

## 2022-05-10 DIAGNOSIS — F319 Bipolar disorder, unspecified: Secondary | ICD-10-CM | POA: Diagnosis not present

## 2022-05-14 DIAGNOSIS — N390 Urinary tract infection, site not specified: Secondary | ICD-10-CM | POA: Diagnosis not present

## 2022-05-14 DIAGNOSIS — J9602 Acute respiratory failure with hypercapnia: Secondary | ICD-10-CM | POA: Diagnosis not present

## 2022-05-14 DIAGNOSIS — B9689 Other specified bacterial agents as the cause of diseases classified elsewhere: Secondary | ICD-10-CM | POA: Diagnosis not present

## 2022-05-14 DIAGNOSIS — I10 Essential (primary) hypertension: Secondary | ICD-10-CM | POA: Diagnosis not present

## 2022-05-14 DIAGNOSIS — F419 Anxiety disorder, unspecified: Secondary | ICD-10-CM | POA: Diagnosis not present

## 2022-05-14 DIAGNOSIS — J9601 Acute respiratory failure with hypoxia: Secondary | ICD-10-CM | POA: Diagnosis not present

## 2022-05-14 DIAGNOSIS — E039 Hypothyroidism, unspecified: Secondary | ICD-10-CM | POA: Diagnosis not present

## 2022-05-14 DIAGNOSIS — F319 Bipolar disorder, unspecified: Secondary | ICD-10-CM | POA: Diagnosis not present

## 2022-05-14 DIAGNOSIS — J441 Chronic obstructive pulmonary disease with (acute) exacerbation: Secondary | ICD-10-CM | POA: Diagnosis not present

## 2022-05-15 DIAGNOSIS — N390 Urinary tract infection, site not specified: Secondary | ICD-10-CM | POA: Diagnosis not present

## 2022-05-15 DIAGNOSIS — J9601 Acute respiratory failure with hypoxia: Secondary | ICD-10-CM | POA: Diagnosis not present

## 2022-05-15 DIAGNOSIS — J9602 Acute respiratory failure with hypercapnia: Secondary | ICD-10-CM | POA: Diagnosis not present

## 2022-05-15 DIAGNOSIS — E039 Hypothyroidism, unspecified: Secondary | ICD-10-CM | POA: Diagnosis not present

## 2022-05-15 DIAGNOSIS — J441 Chronic obstructive pulmonary disease with (acute) exacerbation: Secondary | ICD-10-CM | POA: Diagnosis not present

## 2022-05-15 DIAGNOSIS — B9689 Other specified bacterial agents as the cause of diseases classified elsewhere: Secondary | ICD-10-CM | POA: Diagnosis not present

## 2022-05-15 DIAGNOSIS — F419 Anxiety disorder, unspecified: Secondary | ICD-10-CM | POA: Diagnosis not present

## 2022-05-15 DIAGNOSIS — I10 Essential (primary) hypertension: Secondary | ICD-10-CM | POA: Diagnosis not present

## 2022-05-15 DIAGNOSIS — F319 Bipolar disorder, unspecified: Secondary | ICD-10-CM | POA: Diagnosis not present

## 2022-05-17 DIAGNOSIS — G629 Polyneuropathy, unspecified: Secondary | ICD-10-CM | POA: Diagnosis not present

## 2022-05-17 DIAGNOSIS — Z79890 Hormone replacement therapy (postmenopausal): Secondary | ICD-10-CM | POA: Diagnosis not present

## 2022-05-17 DIAGNOSIS — M940 Chondrocostal junction syndrome [Tietze]: Secondary | ICD-10-CM | POA: Diagnosis not present

## 2022-05-17 DIAGNOSIS — R0602 Shortness of breath: Secondary | ICD-10-CM | POA: Diagnosis not present

## 2022-05-17 DIAGNOSIS — S2231XA Fracture of one rib, right side, initial encounter for closed fracture: Secondary | ICD-10-CM | POA: Diagnosis not present

## 2022-05-17 DIAGNOSIS — K219 Gastro-esophageal reflux disease without esophagitis: Secondary | ICD-10-CM | POA: Diagnosis not present

## 2022-05-17 DIAGNOSIS — Z79899 Other long term (current) drug therapy: Secondary | ICD-10-CM | POA: Diagnosis not present

## 2022-05-17 DIAGNOSIS — R079 Chest pain, unspecified: Secondary | ICD-10-CM | POA: Diagnosis not present

## 2022-05-17 DIAGNOSIS — E8809 Other disorders of plasma-protein metabolism, not elsewhere classified: Secondary | ICD-10-CM | POA: Diagnosis not present

## 2022-05-17 DIAGNOSIS — E039 Hypothyroidism, unspecified: Secondary | ICD-10-CM | POA: Diagnosis not present

## 2022-05-17 DIAGNOSIS — R0902 Hypoxemia: Secondary | ICD-10-CM | POA: Diagnosis not present

## 2022-05-17 DIAGNOSIS — F319 Bipolar disorder, unspecified: Secondary | ICD-10-CM | POA: Diagnosis not present

## 2022-05-17 DIAGNOSIS — R0789 Other chest pain: Secondary | ICD-10-CM | POA: Diagnosis not present

## 2022-05-17 DIAGNOSIS — F419 Anxiety disorder, unspecified: Secondary | ICD-10-CM | POA: Diagnosis not present

## 2022-05-18 DIAGNOSIS — R5381 Other malaise: Secondary | ICD-10-CM | POA: Diagnosis not present

## 2022-05-18 DIAGNOSIS — Z7401 Bed confinement status: Secondary | ICD-10-CM | POA: Diagnosis not present

## 2022-05-18 DIAGNOSIS — R279 Unspecified lack of coordination: Secondary | ICD-10-CM | POA: Diagnosis not present

## 2022-05-24 DIAGNOSIS — I10 Essential (primary) hypertension: Secondary | ICD-10-CM | POA: Diagnosis not present

## 2022-05-24 DIAGNOSIS — M25562 Pain in left knee: Secondary | ICD-10-CM | POA: Diagnosis not present

## 2022-05-24 DIAGNOSIS — E039 Hypothyroidism, unspecified: Secondary | ICD-10-CM | POA: Diagnosis not present

## 2022-05-24 DIAGNOSIS — G8929 Other chronic pain: Secondary | ICD-10-CM | POA: Diagnosis not present

## 2022-05-24 DIAGNOSIS — M25561 Pain in right knee: Secondary | ICD-10-CM | POA: Diagnosis not present

## 2022-05-24 DIAGNOSIS — B9689 Other specified bacterial agents as the cause of diseases classified elsewhere: Secondary | ICD-10-CM | POA: Diagnosis not present

## 2022-05-24 DIAGNOSIS — J9601 Acute respiratory failure with hypoxia: Secondary | ICD-10-CM | POA: Diagnosis not present

## 2022-05-24 DIAGNOSIS — J441 Chronic obstructive pulmonary disease with (acute) exacerbation: Secondary | ICD-10-CM | POA: Diagnosis not present

## 2022-05-24 DIAGNOSIS — M170 Bilateral primary osteoarthritis of knee: Secondary | ICD-10-CM | POA: Diagnosis not present

## 2022-05-24 DIAGNOSIS — F419 Anxiety disorder, unspecified: Secondary | ICD-10-CM | POA: Diagnosis not present

## 2022-05-24 DIAGNOSIS — N390 Urinary tract infection, site not specified: Secondary | ICD-10-CM | POA: Diagnosis not present

## 2022-05-24 DIAGNOSIS — J9602 Acute respiratory failure with hypercapnia: Secondary | ICD-10-CM | POA: Diagnosis not present

## 2022-05-24 DIAGNOSIS — F319 Bipolar disorder, unspecified: Secondary | ICD-10-CM | POA: Diagnosis not present

## 2022-05-26 DIAGNOSIS — T148XXA Other injury of unspecified body region, initial encounter: Secondary | ICD-10-CM | POA: Diagnosis not present

## 2022-05-29 DIAGNOSIS — J9601 Acute respiratory failure with hypoxia: Secondary | ICD-10-CM | POA: Diagnosis not present

## 2022-05-29 DIAGNOSIS — J441 Chronic obstructive pulmonary disease with (acute) exacerbation: Secondary | ICD-10-CM | POA: Diagnosis not present

## 2022-05-29 DIAGNOSIS — B9689 Other specified bacterial agents as the cause of diseases classified elsewhere: Secondary | ICD-10-CM | POA: Diagnosis not present

## 2022-05-29 DIAGNOSIS — J9602 Acute respiratory failure with hypercapnia: Secondary | ICD-10-CM | POA: Diagnosis not present

## 2022-05-29 DIAGNOSIS — F419 Anxiety disorder, unspecified: Secondary | ICD-10-CM | POA: Diagnosis not present

## 2022-05-29 DIAGNOSIS — N390 Urinary tract infection, site not specified: Secondary | ICD-10-CM | POA: Diagnosis not present

## 2022-05-29 DIAGNOSIS — I10 Essential (primary) hypertension: Secondary | ICD-10-CM | POA: Diagnosis not present

## 2022-05-29 DIAGNOSIS — F319 Bipolar disorder, unspecified: Secondary | ICD-10-CM | POA: Diagnosis not present

## 2022-05-29 DIAGNOSIS — E039 Hypothyroidism, unspecified: Secondary | ICD-10-CM | POA: Diagnosis not present

## 2022-05-31 DIAGNOSIS — N390 Urinary tract infection, site not specified: Secondary | ICD-10-CM | POA: Diagnosis not present

## 2022-05-31 DIAGNOSIS — E039 Hypothyroidism, unspecified: Secondary | ICD-10-CM | POA: Diagnosis not present

## 2022-05-31 DIAGNOSIS — J9601 Acute respiratory failure with hypoxia: Secondary | ICD-10-CM | POA: Diagnosis not present

## 2022-05-31 DIAGNOSIS — J441 Chronic obstructive pulmonary disease with (acute) exacerbation: Secondary | ICD-10-CM | POA: Diagnosis not present

## 2022-05-31 DIAGNOSIS — F319 Bipolar disorder, unspecified: Secondary | ICD-10-CM | POA: Diagnosis not present

## 2022-05-31 DIAGNOSIS — I10 Essential (primary) hypertension: Secondary | ICD-10-CM | POA: Diagnosis not present

## 2022-05-31 DIAGNOSIS — J9602 Acute respiratory failure with hypercapnia: Secondary | ICD-10-CM | POA: Diagnosis not present

## 2022-05-31 DIAGNOSIS — F419 Anxiety disorder, unspecified: Secondary | ICD-10-CM | POA: Diagnosis not present

## 2022-05-31 DIAGNOSIS — B9689 Other specified bacterial agents as the cause of diseases classified elsewhere: Secondary | ICD-10-CM | POA: Diagnosis not present

## 2022-06-04 ENCOUNTER — Ambulatory Visit: Payer: Medicare HMO | Admitting: Podiatry

## 2022-06-04 DIAGNOSIS — B9689 Other specified bacterial agents as the cause of diseases classified elsewhere: Secondary | ICD-10-CM | POA: Diagnosis not present

## 2022-06-04 DIAGNOSIS — E039 Hypothyroidism, unspecified: Secondary | ICD-10-CM | POA: Diagnosis not present

## 2022-06-04 DIAGNOSIS — F419 Anxiety disorder, unspecified: Secondary | ICD-10-CM | POA: Diagnosis not present

## 2022-06-04 DIAGNOSIS — F319 Bipolar disorder, unspecified: Secondary | ICD-10-CM | POA: Diagnosis not present

## 2022-06-04 DIAGNOSIS — J9602 Acute respiratory failure with hypercapnia: Secondary | ICD-10-CM | POA: Diagnosis not present

## 2022-06-04 DIAGNOSIS — N390 Urinary tract infection, site not specified: Secondary | ICD-10-CM | POA: Diagnosis not present

## 2022-06-04 DIAGNOSIS — J9601 Acute respiratory failure with hypoxia: Secondary | ICD-10-CM | POA: Diagnosis not present

## 2022-06-04 DIAGNOSIS — J441 Chronic obstructive pulmonary disease with (acute) exacerbation: Secondary | ICD-10-CM | POA: Diagnosis not present

## 2022-06-04 DIAGNOSIS — I10 Essential (primary) hypertension: Secondary | ICD-10-CM | POA: Diagnosis not present

## 2022-07-06 DIAGNOSIS — J449 Chronic obstructive pulmonary disease, unspecified: Secondary | ICD-10-CM | POA: Diagnosis not present

## 2022-07-06 DIAGNOSIS — E039 Hypothyroidism, unspecified: Secondary | ICD-10-CM | POA: Diagnosis not present

## 2022-07-09 DIAGNOSIS — E039 Hypothyroidism, unspecified: Secondary | ICD-10-CM | POA: Diagnosis not present

## 2022-08-07 DIAGNOSIS — Z299 Encounter for prophylactic measures, unspecified: Secondary | ICD-10-CM | POA: Diagnosis not present

## 2022-08-07 DIAGNOSIS — R5383 Other fatigue: Secondary | ICD-10-CM | POA: Diagnosis not present

## 2022-08-07 DIAGNOSIS — I10 Essential (primary) hypertension: Secondary | ICD-10-CM | POA: Diagnosis not present

## 2022-08-07 DIAGNOSIS — J449 Chronic obstructive pulmonary disease, unspecified: Secondary | ICD-10-CM | POA: Diagnosis not present

## 2022-08-07 DIAGNOSIS — R32 Unspecified urinary incontinence: Secondary | ICD-10-CM | POA: Diagnosis not present

## 2022-08-08 DIAGNOSIS — Z885 Allergy status to narcotic agent status: Secondary | ICD-10-CM | POA: Diagnosis not present

## 2022-08-08 DIAGNOSIS — I6782 Cerebral ischemia: Secondary | ICD-10-CM | POA: Diagnosis not present

## 2022-08-08 DIAGNOSIS — W19XXXA Unspecified fall, initial encounter: Secondary | ICD-10-CM | POA: Diagnosis not present

## 2022-08-08 DIAGNOSIS — S8012XA Contusion of left lower leg, initial encounter: Secondary | ICD-10-CM | POA: Diagnosis not present

## 2022-08-08 DIAGNOSIS — R0902 Hypoxemia: Secondary | ICD-10-CM | POA: Diagnosis not present

## 2022-08-08 DIAGNOSIS — Z87891 Personal history of nicotine dependence: Secondary | ICD-10-CM | POA: Diagnosis not present

## 2022-08-08 DIAGNOSIS — R609 Edema, unspecified: Secondary | ICD-10-CM | POA: Diagnosis not present

## 2022-08-08 DIAGNOSIS — E079 Disorder of thyroid, unspecified: Secondary | ICD-10-CM | POA: Diagnosis not present

## 2022-08-08 DIAGNOSIS — G319 Degenerative disease of nervous system, unspecified: Secondary | ICD-10-CM | POA: Diagnosis not present

## 2022-08-08 DIAGNOSIS — Z043 Encounter for examination and observation following other accident: Secondary | ICD-10-CM | POA: Diagnosis not present

## 2022-08-08 DIAGNOSIS — Z79890 Hormone replacement therapy (postmenopausal): Secondary | ICD-10-CM | POA: Diagnosis not present

## 2022-08-08 DIAGNOSIS — M47812 Spondylosis without myelopathy or radiculopathy, cervical region: Secondary | ICD-10-CM | POA: Diagnosis not present

## 2022-08-08 DIAGNOSIS — W0110XA Fall on same level from slipping, tripping and stumbling with subsequent striking against unspecified object, initial encounter: Secondary | ICD-10-CM | POA: Diagnosis not present

## 2022-08-08 DIAGNOSIS — S0990XA Unspecified injury of head, initial encounter: Secondary | ICD-10-CM | POA: Diagnosis not present

## 2022-08-08 DIAGNOSIS — M1712 Unilateral primary osteoarthritis, left knee: Secondary | ICD-10-CM | POA: Diagnosis not present

## 2022-08-23 DIAGNOSIS — R5381 Other malaise: Secondary | ICD-10-CM | POA: Diagnosis not present

## 2022-08-23 DIAGNOSIS — R112 Nausea with vomiting, unspecified: Secondary | ICD-10-CM | POA: Diagnosis not present

## 2022-08-23 DIAGNOSIS — R9389 Abnormal findings on diagnostic imaging of other specified body structures: Secondary | ICD-10-CM | POA: Diagnosis not present

## 2022-08-23 DIAGNOSIS — Z885 Allergy status to narcotic agent status: Secondary | ICD-10-CM | POA: Diagnosis not present

## 2022-08-23 DIAGNOSIS — R059 Cough, unspecified: Secondary | ICD-10-CM | POA: Diagnosis not present

## 2022-08-23 DIAGNOSIS — Z79890 Hormone replacement therapy (postmenopausal): Secondary | ICD-10-CM | POA: Diagnosis not present

## 2022-08-23 DIAGNOSIS — K222 Esophageal obstruction: Secondary | ICD-10-CM | POA: Diagnosis not present

## 2022-08-23 DIAGNOSIS — F319 Bipolar disorder, unspecified: Secondary | ICD-10-CM | POA: Diagnosis not present

## 2022-08-23 DIAGNOSIS — Z743 Need for continuous supervision: Secondary | ICD-10-CM | POA: Diagnosis not present

## 2022-08-23 DIAGNOSIS — R1111 Vomiting without nausea: Secondary | ICD-10-CM | POA: Diagnosis not present

## 2022-08-23 DIAGNOSIS — R69 Illness, unspecified: Secondary | ICD-10-CM | POA: Diagnosis not present

## 2022-08-23 DIAGNOSIS — Z87891 Personal history of nicotine dependence: Secondary | ICD-10-CM | POA: Diagnosis not present

## 2022-08-23 DIAGNOSIS — K219 Gastro-esophageal reflux disease without esophagitis: Secondary | ICD-10-CM | POA: Diagnosis not present

## 2022-08-23 DIAGNOSIS — R0902 Hypoxemia: Secondary | ICD-10-CM | POA: Diagnosis not present

## 2022-08-23 DIAGNOSIS — R1110 Vomiting, unspecified: Secondary | ICD-10-CM | POA: Diagnosis not present

## 2022-08-23 DIAGNOSIS — I959 Hypotension, unspecified: Secondary | ICD-10-CM | POA: Diagnosis not present

## 2022-08-23 DIAGNOSIS — E079 Disorder of thyroid, unspecified: Secondary | ICD-10-CM | POA: Diagnosis not present

## 2022-08-23 DIAGNOSIS — R110 Nausea: Secondary | ICD-10-CM | POA: Diagnosis not present

## 2022-08-23 DIAGNOSIS — E039 Hypothyroidism, unspecified: Secondary | ICD-10-CM | POA: Diagnosis not present

## 2022-08-26 DIAGNOSIS — R5381 Other malaise: Secondary | ICD-10-CM | POA: Diagnosis not present

## 2022-08-26 DIAGNOSIS — W19XXXA Unspecified fall, initial encounter: Secondary | ICD-10-CM | POA: Diagnosis not present

## 2022-08-26 DIAGNOSIS — R531 Weakness: Secondary | ICD-10-CM | POA: Diagnosis not present

## 2022-09-07 DIAGNOSIS — M25561 Pain in right knee: Secondary | ICD-10-CM | POA: Diagnosis not present

## 2022-09-07 DIAGNOSIS — K222 Esophageal obstruction: Secondary | ICD-10-CM | POA: Diagnosis not present

## 2022-09-07 DIAGNOSIS — Z299 Encounter for prophylactic measures, unspecified: Secondary | ICD-10-CM | POA: Diagnosis not present

## 2022-09-07 DIAGNOSIS — I10 Essential (primary) hypertension: Secondary | ICD-10-CM | POA: Diagnosis not present

## 2022-09-19 DIAGNOSIS — R531 Weakness: Secondary | ICD-10-CM | POA: Diagnosis not present

## 2022-09-19 DIAGNOSIS — R5381 Other malaise: Secondary | ICD-10-CM | POA: Diagnosis not present

## 2022-09-20 DIAGNOSIS — L84 Corns and callosities: Secondary | ICD-10-CM | POA: Diagnosis not present

## 2022-09-20 DIAGNOSIS — M79676 Pain in unspecified toe(s): Secondary | ICD-10-CM | POA: Diagnosis not present

## 2022-09-20 DIAGNOSIS — I70203 Unspecified atherosclerosis of native arteries of extremities, bilateral legs: Secondary | ICD-10-CM | POA: Diagnosis not present

## 2022-09-20 DIAGNOSIS — B351 Tinea unguium: Secondary | ICD-10-CM | POA: Diagnosis not present

## 2022-09-22 NOTE — Progress Notes (Unsigned)
GI Office Note    Referring Provider: Kirstie Peri, MD Primary Care Physician:  Kirstie Peri, MD  Primary Gastroenterologist: previously Dr. Kinnie Scales  Chief Complaint   No chief complaint on file.    History of Present Illness   Lindsey Rowe is a 77 y.o. female presenting today for further evaluation of suspected esophageal stricture at the request of Dr. Sherryll Burger. Recent ED visit at [facility] for suspected food impaction. Presented with vomiting and sensation of food "catching in the throat." She was given phenergan in the ED. She was able to swallow foods before D/C. Advised to follow up with GI.  While in the ED: Labs 08/2022: Cre 0.97, Tbili 0.3, AP 114, AST 17, ALT 13, lipase 10, WBC 6700, Hgb 12, Platelets 182000. CXR: no active disease  Today:  Medications   Current Outpatient Medications  Medication Sig Dispense Refill   acetaminophen (TYLENOL) 325 MG tablet Take 2 tablets (650 mg total) by mouth every 6 (six) hours as needed for mild pain, moderate pain or headache. 30 tablet 0   acetaminophen (TYLENOL) 500 MG tablet Take 1,000 mg by mouth every 6 (six) hours as needed for moderate pain. (Patient not taking: Reported on 09/25/2021)     albuterol (PROVENTIL HFA;VENTOLIN HFA) 108 (90 Base) MCG/ACT inhaler Inhale 2 puffs into the lungs every 6 (six) hours as needed for wheezing or shortness of breath. 1 Inhaler 0   busPIRone (BUSPAR) 30 MG tablet Take 30 mg by mouth 3 (three) times daily.     cholecalciferol (VITAMIN D3) 25 MCG (1000 UNIT) tablet Take 1,000 Units by mouth daily.     cycloSPORINE (RESTASIS) 0.05 % ophthalmic emulsion Place 1 drop into both eyes 2 (two) times daily.     denosumab (PROLIA) 60 MG/ML SOSY injection Inject 60 mg into the skin See admin instructions. Every 6 months     diclofenac Sodium (VOLTAREN) 1 % GEL Apply 2 g topically 4 (four) times daily as needed (pain). (Patient not taking: Reported on 09/25/2021)     DULoxetine (CYMBALTA) 30 MG capsule Take 1  capsule (30 mg total) by mouth 2 (two) times daily. 60 capsule 0   fluticasone (FLONASE) 50 MCG/ACT nasal spray Place 1 spray into both nostrils daily as needed for allergies or rhinitis. (Patient not taking: Reported on 05/25/2020) 16 g 2   levothyroxine (SYNTHROID, LEVOTHROID) 75 MCG tablet Take 75 mcg by mouth daily before breakfast.      lidocaine (LIDODERM) 5 % Place 1 patch onto the skin daily. Remove & Discard patch within 12 hours or as directed by MD (Patient not taking: Reported on 06/19/2020) 30 patch 0   LORazepam (ATIVAN) 1 MG tablet Take 1 mg by mouth 2 (two) times daily as needed for anxiety. (Patient not taking: Reported on 09/25/2021)     melatonin 3 MG TABS tablet Take 3 mg by mouth at bedtime. (Patient not taking: Reported on 09/25/2021)     Multiple Vitamins-Minerals (THERA-M) TABS Take 1 tablet by mouth daily.     MYRBETRIQ 50 MG TB24 tablet Take 50 mg by mouth daily.     omeprazole (PRILOSEC) 20 MG capsule Take 20 mg by mouth 2 (two) times daily before a meal.     Polyethyl Glycol-Propyl Glycol (SYSTANE) 0.4-0.3 % GEL ophthalmic gel Place 2 application into both eyes 3 (three) times daily as needed (dry eyes).     pravastatin (PRAVACHOL) 40 MG tablet Take 40 mg by mouth every evening.  pregabalin (LYRICA) 100 MG capsule Take 100 mg by mouth 2 (two) times daily.     QUEtiapine (SEROQUEL) 300 MG tablet Take 300 mg by mouth at bedtime.     SUMAtriptan (IMITREX) 50 MG tablet Take 50 mg by mouth daily as needed for migraine.     traMADol (ULTRAM) 50 MG tablet Take 50 mg by mouth 2 (two) times daily as needed.     triamcinolone cream (KENALOG) 0.1 % Apply 1 application topically daily.     Ubrogepant (UBRELVY) 50 MG TABS Take 50 mg by mouth daily as needed (migraine).     vitamin B-12 (CYANOCOBALAMIN) 1000 MCG tablet Take 1,000 mcg by mouth daily.     Zinc Sulfate 220 (50 Zn) MG TABS Take by mouth.     No current facility-administered medications for this visit.    Allergies    Allergies as of 09/25/2022 - Review Complete 03/05/2022  Allergen Reaction Noted   Gabapentin Other (See Comments) 05/12/2007   Cefadroxil Itching 05/12/2007   Oxycodone Other (See Comments) 07/15/2019    Past Medical History   Past Medical History:  Diagnosis Date   Arthritis    Back pain    Bipolar 1 disorder (HCC)    Complication of anesthesia    hard to wake up anesthesia   Depression    GERD (gastroesophageal reflux disease)    Headache(784.0)    migraines and tension headaches   Hyperlipidemia    Hypertension    Hypothyroidism    Thyroid disease     Past Surgical History   Past Surgical History:  Procedure Laterality Date   APPENDECTOMY     BACK SURGERY     BRAIN SURGERY  06/2019   CRANIOTOMY Right 07/12/2019   Procedure: Guss Bunde for HEMATOMA EVACUATION SUBDURAL;  Surgeon: Jadene Pierini, MD;  Location: [facility] OR;  Service: Neurosurgery;  Laterality: Right;   EYE SURGERY Bilateral    lens implant   GASTRIC BYPASS     LOOP RECORDER INSERTION N/A 05/25/2016   Procedure: Loop Recorder Insertion;  Surgeon: Will Jorja Loa, MD;  Location: [facility] INVASIVE CV LAB;  Service: Cardiovascular;  Laterality: N/A;   LUMBAR LAMINECTOMY/DECOMPRESSION MICRODISCECTOMY Right 07/23/2012   Procedure: LUMBAR LAMINECTOMY/DECOMPRESSION MICRODISCECTOMY 2 LEVELS;  Surgeon: Karn Cassis, MD;  Location: [facility] NEURO ORS;  Service: Neurosurgery;  Laterality: Right;  Right Lumbar three-four  lumbar four-five Laminectomy/Foraminotomy   NASAL SINUS SURGERY Bilateral    TONSILLECTOMY     WRIST SURGERY      Past Family History   Family History  Problem Relation Age of Onset   Heart disease Mother    Emphysema Father    Diabetes Sister    Other Brother        Brain tumor - s/p excision    Past Social History   Social History   Socioeconomic History   Marital status: Widowed    Spouse name: Not on file   Number of children: 0   Years of education: 12   Highest education level:  High school graduate  Occupational History   Occupation: unemployed  Tobacco Use   Smoking status: Never   Smokeless tobacco: Never  Vaping Use   Vaping status: Never Used  Substance and Sexual Activity   Alcohol use: Not Currently    Comment: social   Drug use: No    Comment: Pt + opioids as prescribed   Sexual activity: Not Currently  Other Topics Concern   Not on file  Social  History Narrative   Pt lives alone in apartment in [place].   Social Determinants of Health   Financial Resource Strain: Low Risk  (03/19/2019)   Overall Financial Resource Strain (CARDIA)    Difficulty of Paying Living Expenses: Not hard at all  Food Insecurity: No Food Insecurity (03/19/2019)   Hunger Vital Sign    Worried About Running Out of Food in the Last Year: Never true    Ran Out of Food in the Last Year: Never true  Transportation Needs: No Transportation Needs (03/19/2019)   PRAPARE - Administrator, Civil Service (Medical): No    Lack of Transportation (Non-Medical): No  Physical Activity: Inactive (03/19/2019)   Exercise Vital Sign    Days of Exercise per Week: 0 days    Minutes of Exercise per Session: 0 min  Stress: No Stress Concern Present (03/19/2019)   [employer] of Occupational Health - Occupational Stress Questionnaire    Feeling of Stress : Only a little  Social Connections: Socially Isolated (03/19/2019)   Social Connection and Isolation Panel [NHANES]    Frequency of Communication with Friends and Family: Never    Frequency of Social Gatherings with Friends and Family: Never    Attends Religious Services: 1 to 4 times per year    Active Member of [employer] or Organizations: No    Attends Banker Meetings: Never    Marital Status: Widowed  Intimate Partner Violence: Not At Risk (03/19/2019)   Humiliation, Afraid, Rape, and Kick questionnaire    Fear of Current or Ex-Partner: No    Emotionally Abused: No    Physically Abused: No    Sexually Abused: No     Review of Systems   General: Negative for anorexia, weight loss, fever, chills, fatigue, weakness. Eyes: Negative for vision changes.  ENT: Negative for hoarseness, difficulty swallowing , nasal congestion. CV: Negative for chest pain, angina, palpitations, dyspnea on exertion, peripheral edema.  Respiratory: Negative for dyspnea at rest, dyspnea on exertion, cough, sputum, wheezing.  GI: See history of present illness. GU:  Negative for dysuria, hematuria, urinary incontinence, urinary frequency, nocturnal urination.  MS: Negative for joint pain, low back pain.  Derm: Negative for rash or itching.  Neuro: Negative for weakness, abnormal sensation, seizure, frequent headaches, memory loss,  confusion.  Psych: Negative for anxiety, depression, suicidal ideation, hallucinations.  Endo: Negative for unusual weight change.  Heme: Negative for bruising or bleeding. Allergy: Negative for rash or hives.  Physical Exam   There were no vitals taken for this visit.   General: Well-nourished, well-developed in no acute distress.  Head: Normocephalic, atraumatic.   Eyes: Conjunctiva pink, no icterus. Mouth: Oropharyngeal mucosa moist and pink , no lesions erythema or exudate. Neck: Supple without thyromegaly, masses, or lymphadenopathy.  Lungs: Clear to auscultation bilaterally.  Heart: Regular rate and rhythm, no murmurs rubs or gallops.  Abdomen: Bowel sounds are normal, nontender, nondistended, no hepatosplenomegaly or masses,  no abdominal bruits or hernia, no rebound or guarding.   Rectal: *** Extremities: No lower extremity edema. No clubbing or deformities.  Neuro: Alert and oriented x 4 , grossly normal neurologically.  Skin: Warm and dry, no rash or jaundice.   Psych: Alert and cooperative, normal mood and affect.  Labs   *** Imaging Studies   No results found.  Assessment       PLAN   ***   Leanna Battles. Melvyn Neth, MHS, PA-C [facility]

## 2022-09-25 ENCOUNTER — Ambulatory Visit (INDEPENDENT_AMBULATORY_CARE_PROVIDER_SITE_OTHER): Payer: Medicare HMO | Admitting: Gastroenterology

## 2022-09-25 ENCOUNTER — Encounter: Payer: Self-pay | Admitting: Gastroenterology

## 2022-09-25 VITALS — BP 120/75 | HR 81 | Temp 97.7°F | Ht 63.0 in | Wt 199.6 lb

## 2022-09-25 DIAGNOSIS — R1319 Other dysphagia: Secondary | ICD-10-CM | POA: Diagnosis not present

## 2022-09-25 DIAGNOSIS — R1310 Dysphagia, unspecified: Secondary | ICD-10-CM | POA: Insufficient documentation

## 2022-09-25 MED ORDER — LANSOPRAZOLE 30 MG PO CPDR: 30.0000 mg | DELAYED_RELEASE_CAPSULE | Freq: Every day | ORAL | 11 refills | Status: DC

## 2022-09-25 NOTE — Patient Instructions (Signed)
Increase lansoprazole to 30mg  daily before breakfast. Upper endoscopy to be scheduled.

## 2022-10-22 ENCOUNTER — Telehealth: Payer: Self-pay | Admitting: *Deleted

## 2022-10-22 NOTE — Telephone Encounter (Signed)
Spoke with pt caregiver. Scheduled EGD/ED with Dr. Jena Gauss ASA 3 [phone]. Aware will call back with pre-op appt. Discussed EGD instructions in details.

## 2022-10-23 ENCOUNTER — Encounter: Payer: Self-pay | Admitting: *Deleted

## 2022-11-17 DIAGNOSIS — G9341 Metabolic encephalopathy: Secondary | ICD-10-CM | POA: Diagnosis not present

## 2022-11-17 DIAGNOSIS — J9602 Acute respiratory failure with hypercapnia: Secondary | ICD-10-CM | POA: Diagnosis not present

## 2022-11-19 NOTE — Patient Instructions (Addendum)
20    Your procedure is scheduled on: 11/21/2022  Report to [facility] Main Entrance at 6:15    AM.  Call this number if you have problems the morning of surgery: [phone]   Remember:   Follow instructions on letter from office regarding when to stop eating and drinking        No Smoking the day of procedure  You may have clear liquids until 6 am     Take these medicines the morning of surgery with A SIP OF WATER: Buspar, Cymbalta, levothyroxine, myrbetriq, tramadol, lansoprazole, and lyrica  Use inhalers if needed   Do not wear jewelry, make-up or nail polish.  Do not wear lotions, powders, or perfumes. You may wear deodorant.                Do not bring valuables to the hospital.  Contacts, dentures or bridgework may not be worn into surgery.  Leave suitcase in the car. After surgery it may be brought to your room.  For patients admitted to the hospital, checkout time is 11:00 AM the day of discharge.   Patients discharged the day of surgery will not be allowed to drive home. Upper Endoscopy, Adult Upper endoscopy is a procedure to look inside the upper GI (gastrointestinal) tract. The upper GI tract is made up of: The part of the body that moves food from your mouth to your stomach (esophagus). The stomach. The first part of your small intestine (duodenum). This procedure is also called esophagogastroduodenoscopy (EGD) or gastroscopy. In this procedure, your health care provider passes a thin, flexible tube (endoscope) through your mouth and down your esophagus into your stomach. A small camera is attached to the end of the tube. Images from the camera appear on a monitor in the exam room. During this procedure, your health care provider may also remove a small piece of tissue to be sent to a lab and examined under a microscope (biopsy). Your health care provider may do an upper endoscopy to diagnose cancers of the upper GI tract. You may also have this procedure to find the cause  of other conditions, such as: Stomach pain. Heartburn. Pain or problems when swallowing. Nausea and vomiting. Stomach bleeding. Stomach ulcers. Tell a health care provider about: Any allergies you have. All medicines you are taking, including vitamins, herbs, eye drops, creams, and over-the-counter medicines. Any problems you or family members have had with anesthetic medicines. Any blood disorders you have. Any surgeries you have had. Any medical conditions you have. Whether you are pregnant or may be pregnant. What are the risks? Generally, this is a safe procedure. However, problems may occur, including: Infection. Bleeding. Allergic reactions to medicines. A tear or hole (perforation) in the esophagus, stomach, or duodenum. What happens before the procedure? Staying hydrated Follow instructions from your health care provider about hydration, which may include: Up to 4 hours before the procedure - you may continue to drink clear liquids, such as water, clear fruit juice, black coffee, and plain tea.   Medicines Ask your health care provider about: Changing or stopping your regular medicines. This is especially important if you are taking diabetes medicines or blood thinners. Taking medicines such as aspirin and ibuprofen. These medicines can thin your blood. Do not take these medicines unless your health care provider tells you to take them. Taking over-the-counter medicines, vitamins, herbs, and supplements. General instructions Plan to have someone take you home from the hospital or clinic. If you will  be going home right after the procedure, plan to have someone with you for 24 hours. Ask your health care provider what steps will be taken to help prevent infection. What happens during the procedure?  An IV will be inserted into one of your veins. You may be given one or more of the following: A medicine to help you relax (sedative). A medicine to numb the throat (local  anesthetic). You will lie on your left side on an exam table. Your health care provider will pass the endoscope through your mouth and down your esophagus. Your health care provider will use the scope to check the inside of your esophagus, stomach, and duodenum. Biopsies may be taken. The endoscope will be removed. The procedure may vary among health care providers and hospitals. What happens after the procedure? Your blood pressure, heart rate, breathing rate, and blood oxygen level will be monitored until you leave the hospital or clinic. Do not drive for 24 hours if you were given a sedative during your procedure. When your throat is no longer numb, you may be given some fluids to drink. It is up to you to get the results of your procedure. Ask your health care provider, or the department that is doing the procedure, when your results will be ready. Summary Upper endoscopy is a procedure to look inside the upper GI tract. During the procedure, an IV will be inserted into one of your veins. You may be given a medicine to help you relax. A medicine will be used to numb your throat. The endoscope will be passed through your mouth and down your esophagus. This information is not intended to replace advice given to you by your health care provider. Make sure you discuss any questions you have with your health care provider. Document Revised: 07/17/2017 Document Reviewed: 06/24/2017 Elsevier Patient Education  2020 [employer]                                                                                                                                      EndoscopyCare After  Please read the instructions outlined below and refer to this sheet in the next few weeks. These discharge instructions provide you with general information on caring for yourself after you leave the hospital. Your doctor may also give you specific instructions. While your treatment has been planned according to the  most current medical practices available, unavoidable complications occasionally occur. If you have any problems or questions after discharge, please call your doctor. HOME CARE INSTRUCTIONS Activity You may resume your regular activity but move at a slower pace for the next 24 hours.  Take frequent rest periods for the next 24 hours.  Walking will help expel (get rid of) the air and reduce the bloated feeling in your abdomen.  No driving for 24 hours (because of the anesthesia (medicine) used during the test).  You may shower.  Do  not sign any important legal documents or operate any machinery for 24 hours (because of the anesthesia used during the test).  Nutrition Drink plenty of fluids.  You may resume your normal diet.  Begin with a light meal and progress to your normal diet.  Avoid alcoholic beverages for 24 hours or as instructed by your caregiver.  Medications You may resume your normal medications unless your caregiver tells you otherwise. What you can expect today You may experience abdominal discomfort such as a feeling of fullness or "gas" pains.  You may experience a sore throat for 2 to 3 days. This is normal. Gargling with salt water may help this.  Follow-up Your doctor will discuss the results of your test with you. SEEK IMMEDIATE MEDICAL CARE IF: You have excessive nausea (feeling sick to your stomach) and/or vomiting.  You have severe abdominal pain and distention (swelling).  You have trouble swallowing.  You have a temperature over 100 F (37.8 C).  You have rectal bleeding or vomiting of blood.  Document Released: 09/06/2003 Document Revised: 01/11/2011 Document Reviewed: 03/19/2007 Esophageal Dilatation Esophageal dilatation, also called esophageal dilation, is a procedure to widen or open a blocked or narrowed part of the esophagus. The esophagus is the part of the body that moves food and liquid from the mouth to the stomach. You may need this procedure  if: You have a buildup of scar tissue in your esophagus that makes it difficult, painful, or impossible to swallow. This can be caused by gastroesophageal reflux disease (GERD). You have cancer of the esophagus. There is a problem with how food moves through your esophagus. In some cases, you may need this procedure repeated at a later time to dilate the esophagus gradually. Tell a health care provider about: Any allergies you have. All medicines you are taking, including vitamins, herbs, eye drops, creams, and over-the-counter medicines. Any problems you or family members have had with anesthetic medicines. Any blood disorders you have. Any surgeries you have had. Any medical conditions you have. Any antibiotic medicines you are required to take before dental procedures. Whether you are pregnant or may be pregnant. What are the risks? Generally, this is a safe procedure. However, problems may occur, including: Bleeding due to a tear in the lining of the esophagus. A hole, or perforation, in the esophagus. What happens before the procedure? Ask your health care provider about: Changing or stopping your regular medicines. This is especially important if you are taking diabetes medicines or blood thinners. Taking medicines such as aspirin and ibuprofen. These medicines can thin your blood. Do not take these medicines unless your health care provider tells you to take them. Taking over-the-counter medicines, vitamins, herbs, and supplements. Follow instructions from your health care provider about eating or drinking restrictions. Plan to have a responsible adult take you home from the hospital or clinic. Plan to have a responsible adult care for you for the time you are told after you leave the hospital or clinic. This is important. What happens during the procedure? You may be given a medicine to help you relax (sedative). A numbing medicine may be sprayed into the back of your throat, or  you may gargle the medicine. Your health care provider may perform the dilatation using various surgical instruments, such as: Simple dilators. This instrument is carefully placed in the esophagus to stretch it. Guided wire bougies. This involves using an endoscope to insert a wire into the esophagus. A dilator is passed over this wire  to enlarge the esophagus. Then the wire is removed. Balloon dilators. An endoscope with a small balloon is inserted into the esophagus. The balloon is inflated to stretch the esophagus and open it up. The procedure may vary among health care providers and hospitals. What can I expect after the procedure? Your blood pressure, heart rate, breathing rate, and blood oxygen level will be monitored until you leave the hospital or clinic. Your throat may feel slightly sore and numb. This will get better over time. You will not be allowed to eat or drink until your throat is no longer numb. When you are able to drink, urinate, and sit on the edge of the bed without nausea or dizziness, you may be able to return home. Follow these instructions at home: Take over-the-counter and prescription medicines only as told by your health care provider. If you were given a sedative during the procedure, it can affect you for several hours. Do not drive or operate machinery until your health care provider says that it is safe. Plan to have a responsible adult care for you for the time you are told. This is important. Follow instructions from your health care provider about any eating or drinking restrictions. Do not use any products that contain nicotine or tobacco, such as cigarettes, e-cigarettes, and chewing tobacco. If you need help quitting, ask your health care provider. Keep all follow-up visits. This is important. Contact a health care provider if: You have a fever. You have pain that is not relieved by medicine. Get help right away if: You have chest pain. You have trouble  breathing. You have trouble swallowing. You vomit blood. You have black, tarry, or bloody stools. These symptoms may represent a serious problem that is an emergency. Do not wait to see if the symptoms will go away. Get medical help right away. Call your local emergency services (911 in the [place] Do not drive yourself to the hospital. Summary Esophageal dilatation, also called esophageal dilation, is a procedure to widen or open a blocked or narrowed part of the esophagus. Plan to have a responsible adult take you home from the hospital or clinic. For this procedure, a numbing medicine may be sprayed into the back of your throat, or you may gargle the medicine. Do not drive or operate machinery until your health care provider says that it is safe. This information is not intended to replace advice given to you by your health care provider. Make sure you discuss any questions you have with your health care provider. Document Revised: 06/10/2019 Document Reviewed: 06/10/2019 Elsevier Patient Education  2024 [employer].

## 2022-11-20 ENCOUNTER — Encounter (HOSPITAL_COMMUNITY)
Admission: RE | Admit: 2022-11-20 | Discharge: 2022-11-20 | Disposition: A | Discharge status: Home / Self Care | Payer: 59 | Source: Ambulatory Visit | Attending: Internal Medicine

## 2022-11-20 ENCOUNTER — Encounter (HOSPITAL_COMMUNITY): Payer: Self-pay

## 2022-11-20 VITALS — BP 89/51 | HR 85 | Temp 97.3°F | Ht 64.0 in | Wt 199.0 lb

## 2022-11-20 DIAGNOSIS — R9431 Abnormal electrocardiogram [ECG] [EKG]: Secondary | ICD-10-CM | POA: Insufficient documentation

## 2022-11-20 DIAGNOSIS — Z01810 Encounter for preprocedural cardiovascular examination: Secondary | ICD-10-CM | POA: Insufficient documentation

## 2022-11-20 DIAGNOSIS — I10 Essential (primary) hypertension: Secondary | ICD-10-CM | POA: Insufficient documentation

## 2022-11-20 DIAGNOSIS — Z01818 Encounter for other preprocedural examination: Secondary | ICD-10-CM | POA: Diagnosis present

## 2022-11-20 HISTORY — DX: Dependence on supplemental oxygen: Z99.81

## 2022-11-21 ENCOUNTER — Telehealth: Payer: Self-pay | Admitting: *Deleted

## 2022-11-21 ENCOUNTER — Encounter: Payer: Self-pay | Admitting: *Deleted

## 2022-11-21 NOTE — Telephone Encounter (Signed)
Received message patient was no show for procedure today

## 2022-11-21 NOTE — Telephone Encounter (Signed)
Spoke with Ty at [facility]. They did not have staff/transportation for procedure today. Patient has been rescheduled to 11/27. She needed an arrival time for transportation now. Also instructions/pre-op to be faxed to her at [phone]

## 2022-11-28 DIAGNOSIS — M25561 Pain in right knee: Secondary | ICD-10-CM | POA: Diagnosis not present

## 2022-11-28 DIAGNOSIS — Z299 Encounter for prophylactic measures, unspecified: Secondary | ICD-10-CM | POA: Diagnosis not present

## 2022-11-28 DIAGNOSIS — R52 Pain, unspecified: Secondary | ICD-10-CM | POA: Diagnosis not present

## 2022-11-28 DIAGNOSIS — J449 Chronic obstructive pulmonary disease, unspecified: Secondary | ICD-10-CM | POA: Diagnosis not present

## 2022-11-28 DIAGNOSIS — I10 Essential (primary) hypertension: Secondary | ICD-10-CM | POA: Diagnosis not present

## 2022-11-28 DIAGNOSIS — N1831 Chronic kidney disease, stage 3a: Secondary | ICD-10-CM | POA: Diagnosis not present

## 2022-11-30 ENCOUNTER — Emergency Department (HOSPITAL_COMMUNITY)
Admission: EM | Admit: 2022-11-30 | Discharge: 2022-12-01 | Disposition: A | Discharge status: Home / Self Care | Payer: 59 | Attending: Emergency Medicine | Admitting: Emergency Medicine

## 2022-11-30 ENCOUNTER — Encounter (HOSPITAL_COMMUNITY): Payer: Self-pay

## 2022-11-30 ENCOUNTER — Other Ambulatory Visit: Payer: Self-pay

## 2022-11-30 DIAGNOSIS — Z79899 Other long term (current) drug therapy: Secondary | ICD-10-CM | POA: Diagnosis not present

## 2022-11-30 DIAGNOSIS — M79604 Pain in right leg: Secondary | ICD-10-CM | POA: Diagnosis not present

## 2022-11-30 DIAGNOSIS — L03115 Cellulitis of right lower limb: Secondary | ICD-10-CM | POA: Diagnosis not present

## 2022-11-30 DIAGNOSIS — I10 Essential (primary) hypertension: Secondary | ICD-10-CM | POA: Insufficient documentation

## 2022-11-30 DIAGNOSIS — R600 Localized edema: Secondary | ICD-10-CM

## 2022-11-30 DIAGNOSIS — Z79890 Hormone replacement therapy (postmenopausal): Secondary | ICD-10-CM | POA: Insufficient documentation

## 2022-11-30 DIAGNOSIS — E039 Hypothyroidism, unspecified: Secondary | ICD-10-CM | POA: Insufficient documentation

## 2022-11-30 DIAGNOSIS — R609 Edema, unspecified: Secondary | ICD-10-CM | POA: Diagnosis not present

## 2022-11-30 DIAGNOSIS — J449 Chronic obstructive pulmonary disease, unspecified: Secondary | ICD-10-CM | POA: Insufficient documentation

## 2022-11-30 DIAGNOSIS — Z9981 Dependence on supplemental oxygen: Secondary | ICD-10-CM | POA: Diagnosis not present

## 2022-11-30 DIAGNOSIS — R2241 Localized swelling, mass and lump, right lower limb: Secondary | ICD-10-CM | POA: Diagnosis present

## 2022-11-30 LAB — CBC WITH DIFFERENTIAL/PLATELET
Abs Immature Granulocytes: 0.03 10*3/uL (ref 0.00–0.07)
Basophils Absolute: 0 10*3/uL (ref 0.0–0.1)
Basophils Relative: 0 %
Eosinophils Absolute: 0.2 10*3/uL (ref 0.0–0.5)
Eosinophils Relative: 2 %
HCT: 38.6 % (ref 36.0–46.0)
Hemoglobin: 11.9 g/dL — ABNORMAL LOW (ref 12.0–15.0)
Immature Granulocytes: 0 %
Lymphocytes Relative: 30 %
Lymphs Abs: 2.2 10*3/uL (ref 0.7–4.0)
MCH: 28.7 pg (ref 26.0–34.0)
MCHC: 30.8 g/dL (ref 30.0–36.0)
MCV: 93.2 fL (ref 80.0–100.0)
Monocytes Absolute: 0.7 10*3/uL (ref 0.1–1.0)
Monocytes Relative: 10 %
Neutro Abs: 4.1 10*3/uL (ref 1.7–7.7)
Neutrophils Relative %: 58 %
Platelets: 215 10*3/uL (ref 150–400)
RBC: 4.14 MIL/uL (ref 3.87–5.11)
RDW: 14.6 % (ref 11.5–15.5)
WBC: 7.2 10*3/uL (ref 4.0–10.5)
nRBC: 0 % (ref 0.0–0.2)

## 2022-11-30 LAB — COMPREHENSIVE METABOLIC PANEL
ALT: 14 U/L (ref 0–44)
AST: 19 U/L (ref 15–41)
Albumin: 3.6 g/dL (ref 3.5–5.0)
Alkaline Phosphatase: 101 U/L (ref 38–126)
Anion gap: 6 (ref 5–15)
BUN: 19 mg/dL (ref 8–23)
CO2: 31 mmol/L (ref 22–32)
Calcium: 8.7 mg/dL — ABNORMAL LOW (ref 8.9–10.3)
Chloride: 106 mmol/L (ref 98–111)
Creatinine, Ser: 0.8 mg/dL (ref 0.44–1.00)
GFR, Estimated: >60 mL/min (ref >60)
Glucose, Bld: 93 mg/dL (ref 70–99)
Potassium: 4.1 mmol/L (ref 3.5–5.1)
Sodium: 143 mmol/L (ref 135–145)
Total Bilirubin: 0.1 mg/dL — ABNORMAL LOW (ref 0.3–1.2)
Total Protein: 5.9 g/dL — ABNORMAL LOW (ref 6.5–8.1)

## 2022-11-30 MED ORDER — CLINDAMYCIN HCL 300 MG PO CAPS: 300.0000 mg | ORAL_CAPSULE | Freq: Three times a day (TID) | ORAL | 0 refills | Status: DC

## 2022-11-30 MED ORDER — CLINDAMYCIN HCL 150 MG PO CAPS
300.0000 mg | ORAL_CAPSULE | Freq: Once | ORAL | Status: AC
  Administered 2022-11-30: 300 mg via ORAL
  Filled 2022-11-30: qty 2

## 2022-11-30 MED ORDER — ENOXAPARIN SODIUM 100 MG/ML IJ SOSY
1.0000 mg/kg | PREFILLED_SYRINGE | Freq: Two times a day (BID) | INTRAMUSCULAR | Status: DC
  Administered 2022-11-30: 92.5 mg via SUBCUTANEOUS
  Filled 2022-11-30: qty 1

## 2022-11-30 NOTE — ED Triage Notes (Signed)
Pt brought to ED  via EMS from group home  Pt has hx of COPD wears 2LPM O2 at night Today swelling RIGHT leg from hip down   States that she has numbness that comes and goes in foot.   Denies injuries

## 2022-11-30 NOTE — ED Provider Notes (Signed)
[facility] AT [facility] Provider Note   CSN: [id number] Arrival date & time: 11/30/22  1823     History {Add pertinent medical, surgical, social history, OB history to HPI:1} Chief Complaint  Patient presents with   Leg Swelling    Lindsey Rowe is a 77 y.o. female.  77 year old female with a history of COPD on 2 L nightly oxygen, hypertension, hyperlipidemia, and hypothyroidism who presents to the emergency department with right lower extremity swelling.  For the past 2 days has been having swelling and redness of her calf down to her foot.  Does it is painful.  No fevers or chills.  No chest pain or shortness of breath.  Not currently on blood thinners.  Does not believe that she has had a history of blood clots before.       Home Medications Prior to Admission medications   Medication Sig Start Date End Date Taking? Authorizing Provider  acetaminophen (TYLENOL) 325 MG tablet Take 2 tablets (650 mg total) by mouth every 6 (six) hours as needed for mild pain, moderate pain or headache. 05/02/20   Shaune Pollack, MD  acetaminophen (TYLENOL) 500 MG tablet Take 1,000 mg by mouth every 6 (six) hours as needed for moderate pain.    [provider]  albuterol (PROVENTIL HFA;VENTOLIN HFA) 108 (90 Base) MCG/ACT inhaler Inhale 2 puffs into the lungs every 6 (six) hours as needed for wheezing or shortness of breath. 04/07/18   Charlynne Pander, MD  BANOPHEN 25 MG capsule Take 25 mg by mouth at bedtime. 05/16/22   [provider]  busPIRone (BUSPAR) 30 MG tablet Take 30 mg by mouth 3 (three) times daily. 06/04/20   [provider]  cholecalciferol (VITAMIN D3) 25 MCG (1000 UNIT) tablet Take 1,000 Units by mouth daily.    [provider]  cycloSPORINE (RESTASIS) 0.05 % ophthalmic emulsion Place 1 drop into both eyes 2 (two) times daily.    [provider]  DULoxetine (CYMBALTA) 30 MG capsule Take 1 capsule (30 mg  total) by mouth 2 (two) times daily. Patient taking differently: Take 30 mg by mouth daily. 05/26/16   Pearson Grippe, MD  lansoprazole (PREVACID) 30 MG capsule Take 1 capsule (30 mg total) by mouth daily before breakfast. 09/25/22   Tiffany Kocher, PA-C  levothyroxine (SYNTHROID, LEVOTHROID) 75 MCG tablet Take 100 mcg by mouth daily before breakfast. 09/06/15   [provider]  Multiple Vitamins-Minerals (THERA-M) TABS Take 1 tablet by mouth daily. 09/15/21   [provider]  MYRBETRIQ 50 MG TB24 tablet Take 50 mg by mouth daily. 09/15/21   [provider]  OVER THE COUNTER MEDICATION Oxygen at night    [provider]  oxybutynin (DITROPAN XL) 15 MG 24 hr tablet Take 15 mg by mouth daily. 09/19/22   [provider]  Polyethyl Glycol-Propyl Glycol (SYSTANE) 0.4-0.3 % GEL ophthalmic gel Place 2 application into both eyes 3 (three) times daily as needed (dry eyes).    [provider]  pravastatin (PRAVACHOL) 40 MG tablet Take 40 mg by mouth every evening.     [provider]  pregabalin (LYRICA) 100 MG capsule Take 100 mg by mouth 2 (two) times daily. 06/02/20   [provider]  QUEtiapine (SEROQUEL) 300 MG tablet Take 300 mg by mouth at bedtime. 09/15/21   [provider]  SUMAtriptan (IMITREX) 50 MG tablet Take 50 mg by mouth daily as needed for migraine. 06/01/20  [provider]  traMADol (ULTRAM) 50 MG tablet Take 50 mg by mouth 2 (two) times daily as needed. 08/30/21   [provider]  triamcinolone cream (KENALOG) 0.1 % Apply 1 application topically daily. 06/01/20   [provider]  vitamin B-12 (CYANOCOBALAMIN) 1000 MCG tablet Take 1,000 mcg by mouth daily.    [provider]  ZEASORB-AF 2 % powder Apply topically. 05/28/22   [provider]  Zinc Sulfate 220 (50 Zn) MG TABS Take 100 mg by mouth daily. 09/15/21   [provider]      Allergies    Gabapentin, Cefadroxil,  and Oxycodone    Review of Systems   Review of Systems  Physical Exam Updated Vital Signs BP 137/80   Pulse 68   Temp 98.2 F (36.8 C) (Oral)   Resp 20   Ht 5\' 3"  (1.6 m)   Wt 92.1 kg   SpO2 93%   BMI 35.96 kg/m  Physical Exam Vitals and nursing note reviewed.  Constitutional:      General: She is not in acute distress.    Appearance: She is well-developed.  HENT:     Head: Normocephalic and atraumatic.     Right Ear: External ear normal.     Left Ear: External ear normal.     Nose: Nose normal.  Eyes:     Extraocular Movements: Extraocular movements intact.     Conjunctiva/sclera: Conjunctivae normal.     Pupils: Pupils are equal, round, and reactive to light.  Cardiovascular:     Comments: DP pulses 2+ bilaterally Pulmonary:     Effort: Pulmonary effort is normal. No respiratory distress.  Musculoskeletal:     Cervical back: Normal range of motion and neck supple.     Right lower leg: Edema (2+) present.     Left lower leg: No edema.     Comments: Erythema of the right lower extremity.  Right lower extremity warm and well-perfused.  No joint effusions noted of the knee or ankle.  Full range of motion of right ankle.  5/5 strength in ankle dorsiflexion and plantarflexion.  Intact sensation to light touch of right lower extremity.  Skin:    General: Skin is warm and dry.  Neurological:     Mental Status: She is alert and oriented to person, place, and time. Mental status is at baseline.  Psychiatric:        Mood and Affect: Mood normal.     ED Results / Procedures / Treatments   Labs (all labs ordered are listed, but only abnormal results are displayed) Labs Reviewed  CBC WITH DIFFERENTIAL/PLATELET - Abnormal; Notable for the following components:      Result Value   Hemoglobin 11.9 (*)    All other components within normal limits  COMPREHENSIVE METABOLIC PANEL - Abnormal; Notable for the following components:   Calcium 8.7 (*)    Total Protein 5.9 (*)     Total Bilirubin 0.1 (*)    All other components within normal limits    EKG None  Radiology No results found.  Procedures Procedures  {Document cardiac monitor, telemetry assessment procedure when appropriate:1}  Medications Ordered in ED Medications - No data to display  ED Course/ Medical Decision Making/ A&P   {   Click here for ABCD2, HEART and other calculatorsREFRESH Note before signing :1}  Medical Decision Making Amount and/or Complexity of Data Reviewed Labs: ordered.   ***  {Document critical care time when appropriate:1} {Document review of labs and clinical decision tools ie heart score, Chads2Vasc2 etc:1}  {Document your independent review of radiology images, and any outside records:1} {Document your discussion with family members, caretakers, and with consultants:1} {Document social determinants of health affecting pt's care:1} {Document your decision making why or why not admission, treatments were needed:1} Final Clinical Impression(s) / ED Diagnoses Final diagnoses:  None    Rx / DC Orders ED Discharge Orders     None

## 2022-11-30 NOTE — Discharge Instructions (Signed)
You were seen for your leg infection (cellulitis) in the emergency department.   There is no evidence of blood clot in your leg based on the ultrasound.  At home, please take the clindamycin we have prescribed you.    Return to the emergency department tomorrow at 9 AM for an ultrasound of your leg to make sure you do not have a blood clot.  Follow-up with your primary doctor in 2-3 days regarding your visit.    Return immediately to the emergency department if you experience any of the following: Chest pain, shortness of breath, high fevers, or any other concerning symptoms.    Thank you for visiting our Emergency Department. It was a pleasure taking care of you today.

## 2022-12-01 ENCOUNTER — Emergency Department (HOSPITAL_COMMUNITY): Payer: 59

## 2022-12-01 DIAGNOSIS — M79604 Pain in right leg: Secondary | ICD-10-CM | POA: Diagnosis not present

## 2022-12-01 DIAGNOSIS — R600 Localized edema: Secondary | ICD-10-CM | POA: Diagnosis not present

## 2022-12-01 DIAGNOSIS — L03115 Cellulitis of right lower limb: Secondary | ICD-10-CM | POA: Diagnosis not present

## 2022-12-01 MED ORDER — ACETAMINOPHEN 500 MG PO TABS
1000.0000 mg | ORAL_TABLET | Freq: Once | ORAL | Status: AC
  Administered 2022-12-01: 1000 mg via ORAL
  Filled 2022-12-01: qty 2

## 2022-12-01 MED ORDER — DULOXETINE HCL 30 MG PO CPEP
30.0000 mg | ORAL_CAPSULE | Freq: Once | ORAL | Status: AC
  Administered 2022-12-01: 30 mg via ORAL
  Filled 2022-12-01: qty 1

## 2022-12-01 MED ORDER — BUSPIRONE HCL 5 MG PO TABS
30.0000 mg | ORAL_TABLET | Freq: Once | ORAL | Status: AC
  Administered 2022-12-01: 30 mg via ORAL
  Filled 2022-12-01: qty 6

## 2022-12-01 MED ORDER — CLINDAMYCIN HCL 300 MG PO CAPS: 300.0000 mg | ORAL_CAPSULE | Freq: Three times a day (TID) | ORAL | 0 refills | Status: AC

## 2022-12-01 NOTE — ED Notes (Signed)
Pt given lunch tray and repositioned in bed, pt belongings, purse, shoes, and coat in pt specific bag as pt wanted to keep all of it at bedside with her

## 2022-12-01 NOTE — ED Notes (Addendum)
Pt states "I have Bipolar Disorder and I havent taken my medication yesterday or today and this loud environment is driving me crazy I can't take it"--pt statesshe takes Cymbalta and Buspar--DO made aware

## 2022-12-01 NOTE — ED Provider Notes (Signed)
  Physical Exam  BP 135/82   Pulse 71   Temp 98.4 F (36.9 C) (Oral)   Resp 17   Ht 5\' 3"  (1.6 m)   Wt 92.1 kg   SpO2 94%   BMI 35.96 kg/m   Physical Exam Vitals and nursing note reviewed.  HENT:     Head: Normocephalic and atraumatic.  Eyes:     Pupils: Pupils are equal, round, and reactive to light.  Cardiovascular:     Rate and Rhythm: Normal rate and regular rhythm.  Pulmonary:     Effort: Pulmonary effort is normal.     Breath sounds: Normal breath sounds.  Abdominal:     Palpations: Abdomen is soft.     Tenderness: There is no abdominal tenderness.  Musculoskeletal:     Right lower leg: Edema present.  Skin:    General: Skin is warm and dry.  Neurological:     Mental Status: She is alert.  Psychiatric:        Mood and Affect: Mood normal.     Procedures  Procedures  ED Course / MDM   Clinical Course as of 12/01/22 0927  Fri Nov 30, 2022  2021 Talked to Diplomatic Services operational officer. the group home will be able arrange transport for her to get the US done during the day [RP]  2323 Signed out to Dr Posey Rea [RP]  Sat Dec 01, 2022  [id number] No evidence of DVT on ultrasound.  Informed patient of result.  Clindamycin has been called into the pharmacy to treat for cellulitis.  She will be discharged back to group home. [MP]    Clinical Course User Index [MP] Royanne Foots, DO [RP] Rondel Baton, MD   Medical Decision Making I, Estelle June DO, have assumed care of this patient from the previous provider right lower extremity ultrasound to evaluate for DVT, reevaluation and disposition  Amount and/or Complexity of Data Reviewed Labs: ordered.  Risk OTC drugs. Prescription drug management.    Final diagnosis Right lower extremity cellulitis Leg edema      Royanne Foots, DO 12/01/22 [id number]

## 2022-12-01 NOTE — ED Notes (Signed)
Pt given breakfast tray

## 2022-12-01 NOTE — ED Notes (Signed)
Assitsed pt to the BR in wheelchair, peri care performed, brief changed

## 2022-12-13 DIAGNOSIS — Z6833 Body mass index (BMI) 33.0-33.9, adult: Secondary | ICD-10-CM | POA: Diagnosis not present

## 2022-12-13 DIAGNOSIS — L0390 Cellulitis, unspecified: Secondary | ICD-10-CM | POA: Diagnosis not present

## 2022-12-28 DIAGNOSIS — Z87891 Personal history of nicotine dependence: Secondary | ICD-10-CM | POA: Diagnosis not present

## 2022-12-28 DIAGNOSIS — M1612 Unilateral primary osteoarthritis, left hip: Secondary | ICD-10-CM | POA: Diagnosis not present

## 2022-12-28 DIAGNOSIS — S43402A Unspecified sprain of left shoulder joint, initial encounter: Secondary | ICD-10-CM | POA: Diagnosis not present

## 2022-12-28 DIAGNOSIS — E041 Nontoxic single thyroid nodule: Secondary | ICD-10-CM | POA: Diagnosis not present

## 2022-12-28 DIAGNOSIS — M19012 Primary osteoarthritis, left shoulder: Secondary | ICD-10-CM | POA: Diagnosis not present

## 2022-12-28 DIAGNOSIS — M5030 Other cervical disc degeneration, unspecified cervical region: Secondary | ICD-10-CM | POA: Diagnosis not present

## 2022-12-28 DIAGNOSIS — I712 Thoracic aortic aneurysm, without rupture, unspecified: Secondary | ICD-10-CM | POA: Diagnosis not present

## 2022-12-28 DIAGNOSIS — S29009A Unspecified injury of muscle and tendon of unspecified wall of thorax, initial encounter: Secondary | ICD-10-CM | POA: Diagnosis not present

## 2022-12-28 DIAGNOSIS — S065X0A Traumatic subdural hemorrhage without loss of consciousness, initial encounter: Secondary | ICD-10-CM | POA: Diagnosis not present

## 2022-12-28 DIAGNOSIS — S29001A Unspecified injury of muscle and tendon of front wall of thorax, initial encounter: Secondary | ICD-10-CM | POA: Diagnosis not present

## 2022-12-28 DIAGNOSIS — S2241XA Multiple fractures of ribs, right side, initial encounter for closed fracture: Secondary | ICD-10-CM | POA: Diagnosis not present

## 2022-12-28 DIAGNOSIS — S065XAA Traumatic subdural hemorrhage with loss of consciousness status unknown, initial encounter: Secondary | ICD-10-CM | POA: Diagnosis not present

## 2022-12-28 DIAGNOSIS — S43002A Unspecified subluxation of left shoulder joint, initial encounter: Secondary | ICD-10-CM | POA: Diagnosis not present

## 2022-12-28 DIAGNOSIS — R519 Headache, unspecified: Secondary | ICD-10-CM | POA: Diagnosis not present

## 2022-12-28 DIAGNOSIS — F319 Bipolar disorder, unspecified: Secondary | ICD-10-CM | POA: Diagnosis not present

## 2022-12-28 DIAGNOSIS — M4312 Spondylolisthesis, cervical region: Secondary | ICD-10-CM | POA: Diagnosis not present

## 2022-12-28 DIAGNOSIS — K8689 Other specified diseases of pancreas: Secondary | ICD-10-CM | POA: Diagnosis not present

## 2022-12-28 DIAGNOSIS — M25512 Pain in left shoulder: Secondary | ICD-10-CM | POA: Diagnosis not present

## 2022-12-28 DIAGNOSIS — R079 Chest pain, unspecified: Secondary | ICD-10-CM | POA: Diagnosis not present

## 2022-12-28 DIAGNOSIS — I7121 Aneurysm of the ascending aorta, without rupture: Secondary | ICD-10-CM | POA: Diagnosis not present

## 2022-12-28 DIAGNOSIS — R9389 Abnormal findings on diagnostic imaging of other specified body structures: Secondary | ICD-10-CM | POA: Diagnosis not present

## 2022-12-28 DIAGNOSIS — M25552 Pain in left hip: Secondary | ICD-10-CM | POA: Diagnosis not present

## 2022-12-28 DIAGNOSIS — Z043 Encounter for examination and observation following other accident: Secondary | ICD-10-CM | POA: Diagnosis not present

## 2022-12-28 DIAGNOSIS — E039 Hypothyroidism, unspecified: Secondary | ICD-10-CM | POA: Diagnosis not present

## 2022-12-31 ENCOUNTER — Telehealth: Payer: Self-pay | Admitting: *Deleted

## 2022-12-31 ENCOUNTER — Encounter (HOSPITAL_COMMUNITY)
Admission: RE | Admit: 2022-12-31 | Discharge: 2022-12-31 | Disposition: A | Discharge status: Home / Self Care | Payer: Medicare HMO | Source: Ambulatory Visit | Attending: Internal Medicine | Admitting: Internal Medicine

## 2022-12-31 NOTE — Pre-Procedure Instructions (Signed)
Attempted pre-op phone call. Ann, the caregiver states she cannot bring her on Wednesday for her procedure. She wants office to call her to reschedule. Office messaged.

## 2022-12-31 NOTE — Telephone Encounter (Signed)
Per Endo "I just got off the phone with the caregiver at [facility], where Lindsey Rowe resides and they want you to call them so they can reschedule her procedure. She states they cannot bring her on Wednesday."  This is the 2nd time they have cancelled due to facility not arranging transportation. Also she was at [facility] 11/25. [name], please review and let me know if she will need OV prior to rescheduling. Dr. Jena Gauss is now booking into January.

## 2023-01-01 NOTE — Telephone Encounter (Signed)
Will you please call and schedule per [name]. Thanks!

## 2023-01-01 NOTE — Telephone Encounter (Signed)
That is unfortunate. I last saw her in 09/2022. She suffered a fall and tiny subdural hematoma and likely rib fractures per ED note on 12/28/22. At this time, I would suggest repeat OV to reassess appropriateness and timing of EGD.

## 2023-01-02 ENCOUNTER — Encounter (HOSPITAL_COMMUNITY): Admission: RE | Payer: Self-pay | Source: Home / Self Care

## 2023-01-02 ENCOUNTER — Ambulatory Visit (HOSPITAL_COMMUNITY): Admission: RE | Admit: 2023-01-02 | Payer: Medicaid Other | Source: Home / Self Care | Admitting: Internal Medicine

## 2023-01-02 SURGERY — ESOPHAGOGASTRODUODENOSCOPY (EGD) WITH PROPOFOL
Anesthesia: Monitor Anesthesia Care

## 2023-01-15 ENCOUNTER — Encounter: Payer: Self-pay | Admitting: Gastroenterology

## 2023-01-15 ENCOUNTER — Ambulatory Visit: Payer: Medicare HMO | Admitting: Gastroenterology

## 2023-01-15 NOTE — Progress Notes (Unsigned)
GI Office Note    Referring Provider: Kirstie Peri, MD Primary Care Physician:  Kirstie Peri, MD  Primary Gastroenterologist:  Chief Complaint   No chief complaint on file.   History of Present Illness   Lindsey Rowe is a 77 y.o. female presenting today for follow up to determine timing of EGD. She has h/o suspected esophageal stricture, seen initially back in 09/2022 for these symptoms. Had been seen in the ED for suspected food impaction prior to that visit.   Cancelled EGD due to transportation X 2. In ED 12/28/22 after fall, suffered tiny subdural hematoma.   Labs 12/28/22: Hgb 13.7, hct 42.9, Platelets 215, creatinine 1.12, Tbili 0.4, AP 113, AST 25, ALT 21, INR 1.06.   CT cervical spine without contrast 12/28/22 1. No acute fracture or subluxation of the cervical spine.  2. Stable degenerative disc disease and facet hypertrophy.   CT head without contrast 12/28/22 1. Trace subdural hematoma along the right cerebral convexity, which  measures up to 3 mm. No significant mass effect or midline shift.  2. Left posterior parieto-occipital scalp hematoma.   CT chest/abd/pelvis without contrast 12/28/22 1. No acute traumatic injury in the chest, abdomen, or pelvis.  2. Dilated ascending aorta measuring 45 mm in diameter. Ascending  thoracic aortic aneurysm. Recommend semi-annual imaging followup by  CTA or MRA and referral to cardiothoracic surgery if not already  obtained. This recommendation follows 2010  ACCF/AHA/AATS/ACR/ASA/SCA/SCAI/SIR/STS/SVM Guidelines for the  Diagnosis and Management of Patients With Thoracic Aortic Disease.  Circulation. 2010; 121: Z610-R604. Aortic aneurysm NOS (ICD10-I71.9)  3. Punctate nonobstructing calyceal stones bilaterally.  4. Moderate to large colonic stool burden.   Medications   Current Outpatient Medications  Medication Sig Dispense Refill   acetaminophen (TYLENOL) 325 MG tablet Take 2 tablets (650 mg total) by mouth every  6 (six) hours as needed for mild pain, moderate pain or headache. 30 tablet 0   acetaminophen (TYLENOL) 500 MG tablet Take 1,000 mg by mouth every 6 (six) hours as needed for moderate pain.     albuterol (PROVENTIL HFA;VENTOLIN HFA) 108 (90 Base) MCG/ACT inhaler Inhale 2 puffs into the lungs every 6 (six) hours as needed for wheezing or shortness of breath. 1 Inhaler 0   BANOPHEN 25 MG capsule Take 25 mg by mouth at bedtime.     busPIRone (BUSPAR) 30 MG tablet Take 30 mg by mouth 3 (three) times daily.     cholecalciferol (VITAMIN D3) 25 MCG (1000 UNIT) tablet Take 1,000 Units by mouth daily.     cycloSPORINE (RESTASIS) 0.05 % ophthalmic emulsion Place 1 drop into both eyes 2 (two) times daily.     DULoxetine (CYMBALTA) 30 MG capsule Take 1 capsule (30 mg total) by mouth 2 (two) times daily. (Patient taking differently: Take 30 mg by mouth daily.) 60 capsule 0   lansoprazole (PREVACID) 30 MG capsule Take 1 capsule (30 mg total) by mouth daily before breakfast. 30 capsule 11   levothyroxine (SYNTHROID, LEVOTHROID) 75 MCG tablet Take 100 mcg by mouth daily before breakfast.     Multiple Vitamins-Minerals (THERA-M) TABS Take 1 tablet by mouth daily.     MYRBETRIQ 50 MG TB24 tablet Take 50 mg by mouth daily.     OVER THE COUNTER MEDICATION Oxygen at night     oxybutynin (DITROPAN XL) 15 MG 24 hr tablet Take 15 mg by mouth daily.     Polyethyl Glycol-Propyl Glycol (SYSTANE) 0.4-0.3 % GEL ophthalmic gel Place 2 application  into both eyes 3 (three) times daily as needed (dry eyes).     pravastatin (PRAVACHOL) 40 MG tablet Take 40 mg by mouth every evening.      pregabalin (LYRICA) 100 MG capsule Take 100 mg by mouth 2 (two) times daily.     QUEtiapine (SEROQUEL) 300 MG tablet Take 300 mg by mouth at bedtime.     SUMAtriptan (IMITREX) 50 MG tablet Take 50 mg by mouth daily as needed for migraine.     traMADol (ULTRAM) 50 MG tablet Take 50 mg by mouth 2 (two) times daily as needed.     triamcinolone cream  (KENALOG) 0.1 % Apply 1 application topically daily.     vitamin B-12 (CYANOCOBALAMIN) 1000 MCG tablet Take 1,000 mcg by mouth daily.     ZEASORB-AF 2 % powder Apply topically.     Zinc Sulfate 220 (50 Zn) MG TABS Take 100 mg by mouth daily.     No current facility-administered medications for this visit.    Allergies   Allergies as of 01/15/2023 - Review Complete 11/30/2022  Allergen Reaction Noted   Gabapentin Other (See Comments) 05/12/2007   Cefadroxil Itching 05/12/2007   Oxycodone Other (See Comments) 07/15/2019     Past Medical History   Past Medical History:  Diagnosis Date   Arthritis    Back pain    Bipolar 1 disorder (HCC)    Complication of anesthesia    hard to wake up anesthesia   Depression    GERD (gastroesophageal reflux disease)    Headache(784.0)    migraines and tension headaches   Hyperlipidemia    Hypertension    Hypothyroidism    Oxygen dependent    2L/[place]   Thyroid disease     Past Surgical History   Past Surgical History:  Procedure Laterality Date   APPENDECTOMY     BACK SURGERY     BRAIN SURGERY  06/2019   CRANIOTOMY Right 07/12/2019   Procedure: Guss Bunde for HEMATOMA EVACUATION SUBDURAL;  Surgeon: Jadene Pierini, MD;  Location: [facility] OR;  Service: Neurosurgery;  Laterality: Right;   EYE SURGERY Bilateral    lens implant   GASTRIC BYPASS     LOOP RECORDER INSERTION N/A 05/25/2016   Procedure: Loop Recorder Insertion;  Surgeon: Will Jorja Loa, MD;  Location: [facility];  Service: Cardiovascular;  Laterality: N/A;   LUMBAR LAMINECTOMY/DECOMPRESSION MICRODISCECTOMY Right 07/23/2012   Procedure: LUMBAR LAMINECTOMY/DECOMPRESSION MICRODISCECTOMY 2 LEVELS;  Surgeon: Karn Cassis, MD;  Location: [facility];  Service: Neurosurgery;  Laterality: Right;  Right Lumbar three-four  lumbar four-five Laminectomy/Foraminotomy   NASAL SINUS SURGERY Bilateral    TONSILLECTOMY     WRIST SURGERY      Past Family History   Family  History  Problem Relation Age of Onset   Heart disease Mother    Emphysema Father    Diabetes Sister    Other Brother        Brain tumor - s/p excision   Colon cancer Neg Hx     Past Social History   Social History   Socioeconomic History   Marital status: Widowed    Spouse name: Not on file   Number of children: 0   Years of education: 12   Highest education level: High school graduate  Occupational History   Occupation: unemployed  Tobacco Use   Smoking status: Never   Smokeless tobacco: Never  Vaping Use   Vaping status: Never Used  Substance and Sexual  Activity   Alcohol use: Not Currently    Comment: social   Drug use: No    Comment: Pt + opioids as prescribed   Sexual activity: Not Currently  Other Topics Concern   Not on file  Social History Narrative   Pt lives alone in apartment in [place].   Social Determinants of Health   Financial Resource Strain: Low Risk  (03/19/2019)   Overall Financial Resource Strain (CARDIA)    Difficulty of Paying Living Expenses: Not hard at all  Food Insecurity: No Food Insecurity (03/19/2019)   Hunger Vital Sign    Worried About Running Out of Food in the Last Year: Never true    Ran Out of Food in the Last Year: Never true  Transportation Needs: No Transportation Needs (03/19/2019)   PRAPARE - Administrator, Civil Service (Medical): No    Lack of Transportation (Non-Medical): No  Physical Activity: Inactive (03/19/2019)   Exercise Vital Sign    Days of Exercise per Week: 0 days    Minutes of Exercise per Session: 0 min  Stress: No Stress Concern Present (03/19/2019)   [employer] of Occupational Health - Occupational Stress Questionnaire    Feeling of Stress : Only a little  Social Connections: Socially Isolated (03/19/2019)   Social Connection and Isolation Panel [NHANES]    Frequency of Communication with Friends and Family: Never    Frequency of Social Gatherings with Friends and Family: Never    Attends  Religious Services: 1 to 4 times per year    Active Member of [employer] or Organizations: No    Attends Banker Meetings: Never    Marital Status: Widowed  Intimate Partner Violence: Not At Risk (03/19/2019)   Humiliation, Afraid, Rape, and Kick questionnaire    Fear of Current or Ex-Partner: No    Emotionally Abused: No    Physically Abused: No    Sexually Abused: No    Review of Systems   General: Negative for anorexia, weight loss, fever, chills, fatigue, weakness. ENT: Negative for hoarseness, difficulty swallowing , nasal congestion. CV: Negative for chest pain, angina, palpitations, dyspnea on exertion, peripheral edema.  Respiratory: Negative for dyspnea at rest, dyspnea on exertion, cough, sputum, wheezing.  GI: See history of present illness. GU:  Negative for dysuria, hematuria, urinary incontinence, urinary frequency, nocturnal urination.  Endo: Negative for unusual weight change.     Physical Exam   There were no vitals taken for this visit.   General: Well-nourished, well-developed in no acute distress.  Eyes: No icterus. Mouth: Oropharyngeal mucosa moist and pink , no lesions erythema or exudate. Lungs: Clear to auscultation bilaterally.  Heart: Regular rate and rhythm, no murmurs rubs or gallops.  Abdomen: Bowel sounds are normal, nontender, nondistended, no hepatosplenomegaly or masses,  no abdominal bruits or hernia , no rebound or guarding.  Rectal: ***  Extremities: No lower extremity edema. No clubbing or deformities. Neuro: Alert and oriented x 4   Skin: Warm and dry, no jaundice.   Psych: Alert and cooperative, normal mood and affect.  Labs   *** Imaging Studies   No results found.  Assessment      Loop recorder PLAN   ***   Leanna Battles. Melvyn Neth, MHS, PA-C [facility]

## 2023-01-28 DIAGNOSIS — M797 Fibromyalgia: Secondary | ICD-10-CM | POA: Diagnosis not present

## 2023-01-28 DIAGNOSIS — I10 Essential (primary) hypertension: Secondary | ICD-10-CM | POA: Diagnosis not present

## 2023-01-31 DIAGNOSIS — R0989 Other specified symptoms and signs involving the circulatory and respiratory systems: Secondary | ICD-10-CM | POA: Diagnosis not present

## 2023-01-31 DIAGNOSIS — N179 Acute kidney failure, unspecified: Secondary | ICD-10-CM | POA: Diagnosis not present

## 2023-01-31 DIAGNOSIS — M47812 Spondylosis without myelopathy or radiculopathy, cervical region: Secondary | ICD-10-CM | POA: Diagnosis not present

## 2023-01-31 DIAGNOSIS — M5021 Other cervical disc displacement,  high cervical region: Secondary | ICD-10-CM | POA: Diagnosis not present

## 2023-01-31 DIAGNOSIS — R4182 Altered mental status, unspecified: Secondary | ICD-10-CM | POA: Diagnosis not present

## 2023-01-31 DIAGNOSIS — Z792 Long term (current) use of antibiotics: Secondary | ICD-10-CM | POA: Diagnosis not present

## 2023-01-31 DIAGNOSIS — F319 Bipolar disorder, unspecified: Secondary | ICD-10-CM | POA: Diagnosis not present

## 2023-01-31 DIAGNOSIS — I7121 Aneurysm of the ascending aorta, without rupture: Secondary | ICD-10-CM | POA: Diagnosis not present

## 2023-01-31 DIAGNOSIS — R55 Syncope and collapse: Secondary | ICD-10-CM | POA: Diagnosis not present

## 2023-01-31 DIAGNOSIS — N3000 Acute cystitis without hematuria: Secondary | ICD-10-CM | POA: Diagnosis not present

## 2023-01-31 DIAGNOSIS — R569 Unspecified convulsions: Secondary | ICD-10-CM | POA: Diagnosis not present

## 2023-01-31 DIAGNOSIS — K219 Gastro-esophageal reflux disease without esophagitis: Secondary | ICD-10-CM | POA: Diagnosis not present

## 2023-01-31 DIAGNOSIS — R531 Weakness: Secondary | ICD-10-CM | POA: Diagnosis not present

## 2023-01-31 DIAGNOSIS — I712 Thoracic aortic aneurysm, without rupture, unspecified: Secondary | ICD-10-CM | POA: Diagnosis not present

## 2023-01-31 DIAGNOSIS — K222 Esophageal obstruction: Secondary | ICD-10-CM | POA: Diagnosis not present

## 2023-01-31 DIAGNOSIS — I700 Atherosclerosis of aorta: Secondary | ICD-10-CM | POA: Diagnosis not present

## 2023-01-31 DIAGNOSIS — Z7901 Long term (current) use of anticoagulants: Secondary | ICD-10-CM | POA: Diagnosis not present

## 2023-01-31 DIAGNOSIS — Z79899 Other long term (current) drug therapy: Secondary | ICD-10-CM | POA: Diagnosis not present

## 2023-01-31 DIAGNOSIS — E039 Hypothyroidism, unspecified: Secondary | ICD-10-CM | POA: Diagnosis not present

## 2023-01-31 DIAGNOSIS — N182 Chronic kidney disease, stage 2 (mild): Secondary | ICD-10-CM | POA: Diagnosis not present

## 2023-01-31 DIAGNOSIS — I2510 Atherosclerotic heart disease of native coronary artery without angina pectoris: Secondary | ICD-10-CM | POA: Diagnosis not present

## 2023-01-31 DIAGNOSIS — F419 Anxiety disorder, unspecified: Secondary | ICD-10-CM | POA: Diagnosis not present

## 2023-01-31 DIAGNOSIS — J9819 Other pulmonary collapse: Secondary | ICD-10-CM | POA: Diagnosis not present

## 2023-02-07 DIAGNOSIS — I712 Thoracic aortic aneurysm, without rupture, unspecified: Secondary | ICD-10-CM | POA: Diagnosis not present

## 2023-02-07 DIAGNOSIS — K219 Gastro-esophageal reflux disease without esophagitis: Secondary | ICD-10-CM | POA: Diagnosis not present

## 2023-02-07 DIAGNOSIS — N3000 Acute cystitis without hematuria: Secondary | ICD-10-CM | POA: Diagnosis not present

## 2023-02-07 DIAGNOSIS — F419 Anxiety disorder, unspecified: Secondary | ICD-10-CM | POA: Diagnosis not present

## 2023-02-07 DIAGNOSIS — N179 Acute kidney failure, unspecified: Secondary | ICD-10-CM | POA: Diagnosis not present

## 2023-02-07 DIAGNOSIS — E039 Hypothyroidism, unspecified: Secondary | ICD-10-CM | POA: Diagnosis not present

## 2023-02-07 DIAGNOSIS — N182 Chronic kidney disease, stage 2 (mild): Secondary | ICD-10-CM | POA: Diagnosis not present

## 2023-02-07 DIAGNOSIS — G629 Polyneuropathy, unspecified: Secondary | ICD-10-CM | POA: Diagnosis not present

## 2023-02-07 DIAGNOSIS — F319 Bipolar disorder, unspecified: Secondary | ICD-10-CM | POA: Diagnosis not present

## 2023-02-12 DIAGNOSIS — F319 Bipolar disorder, unspecified: Secondary | ICD-10-CM | POA: Diagnosis not present

## 2023-02-12 DIAGNOSIS — N179 Acute kidney failure, unspecified: Secondary | ICD-10-CM | POA: Diagnosis not present

## 2023-02-12 DIAGNOSIS — E039 Hypothyroidism, unspecified: Secondary | ICD-10-CM | POA: Diagnosis not present

## 2023-02-12 DIAGNOSIS — G629 Polyneuropathy, unspecified: Secondary | ICD-10-CM | POA: Diagnosis not present

## 2023-02-12 DIAGNOSIS — K219 Gastro-esophageal reflux disease without esophagitis: Secondary | ICD-10-CM | POA: Diagnosis not present

## 2023-02-12 DIAGNOSIS — I712 Thoracic aortic aneurysm, without rupture, unspecified: Secondary | ICD-10-CM | POA: Diagnosis not present

## 2023-02-12 DIAGNOSIS — N182 Chronic kidney disease, stage 2 (mild): Secondary | ICD-10-CM | POA: Diagnosis not present

## 2023-02-12 DIAGNOSIS — F419 Anxiety disorder, unspecified: Secondary | ICD-10-CM | POA: Diagnosis not present

## 2023-02-12 DIAGNOSIS — N3000 Acute cystitis without hematuria: Secondary | ICD-10-CM | POA: Diagnosis not present

## 2023-02-14 DIAGNOSIS — J449 Chronic obstructive pulmonary disease, unspecified: Secondary | ICD-10-CM | POA: Diagnosis not present

## 2023-02-14 DIAGNOSIS — R52 Pain, unspecified: Secondary | ICD-10-CM | POA: Diagnosis not present

## 2023-02-14 DIAGNOSIS — Z299 Encounter for prophylactic measures, unspecified: Secondary | ICD-10-CM | POA: Diagnosis not present

## 2023-02-14 DIAGNOSIS — N3000 Acute cystitis without hematuria: Secondary | ICD-10-CM | POA: Diagnosis not present

## 2023-02-14 DIAGNOSIS — M797 Fibromyalgia: Secondary | ICD-10-CM | POA: Diagnosis not present

## 2023-02-14 DIAGNOSIS — I10 Essential (primary) hypertension: Secondary | ICD-10-CM | POA: Diagnosis not present

## 2023-02-15 DIAGNOSIS — N3000 Acute cystitis without hematuria: Secondary | ICD-10-CM | POA: Diagnosis not present

## 2023-02-15 DIAGNOSIS — K219 Gastro-esophageal reflux disease without esophagitis: Secondary | ICD-10-CM | POA: Diagnosis not present

## 2023-02-15 DIAGNOSIS — I712 Thoracic aortic aneurysm, without rupture, unspecified: Secondary | ICD-10-CM | POA: Diagnosis not present

## 2023-02-15 DIAGNOSIS — F419 Anxiety disorder, unspecified: Secondary | ICD-10-CM | POA: Diagnosis not present

## 2023-02-15 DIAGNOSIS — E039 Hypothyroidism, unspecified: Secondary | ICD-10-CM | POA: Diagnosis not present

## 2023-02-15 DIAGNOSIS — N179 Acute kidney failure, unspecified: Secondary | ICD-10-CM | POA: Diagnosis not present

## 2023-02-15 DIAGNOSIS — N182 Chronic kidney disease, stage 2 (mild): Secondary | ICD-10-CM | POA: Diagnosis not present

## 2023-02-15 DIAGNOSIS — F319 Bipolar disorder, unspecified: Secondary | ICD-10-CM | POA: Diagnosis not present

## 2023-02-15 DIAGNOSIS — G629 Polyneuropathy, unspecified: Secondary | ICD-10-CM | POA: Diagnosis not present

## 2023-02-18 DIAGNOSIS — N182 Chronic kidney disease, stage 2 (mild): Secondary | ICD-10-CM | POA: Diagnosis not present

## 2023-02-18 DIAGNOSIS — G629 Polyneuropathy, unspecified: Secondary | ICD-10-CM | POA: Diagnosis not present

## 2023-02-18 DIAGNOSIS — E039 Hypothyroidism, unspecified: Secondary | ICD-10-CM | POA: Diagnosis not present

## 2023-02-18 DIAGNOSIS — F319 Bipolar disorder, unspecified: Secondary | ICD-10-CM | POA: Diagnosis not present

## 2023-02-18 DIAGNOSIS — I712 Thoracic aortic aneurysm, without rupture, unspecified: Secondary | ICD-10-CM | POA: Diagnosis not present

## 2023-02-18 DIAGNOSIS — F419 Anxiety disorder, unspecified: Secondary | ICD-10-CM | POA: Diagnosis not present

## 2023-02-18 DIAGNOSIS — N179 Acute kidney failure, unspecified: Secondary | ICD-10-CM | POA: Diagnosis not present

## 2023-02-18 DIAGNOSIS — N3000 Acute cystitis without hematuria: Secondary | ICD-10-CM | POA: Diagnosis not present

## 2023-02-18 DIAGNOSIS — K219 Gastro-esophageal reflux disease without esophagitis: Secondary | ICD-10-CM | POA: Diagnosis not present

## 2023-02-19 DIAGNOSIS — K219 Gastro-esophageal reflux disease without esophagitis: Secondary | ICD-10-CM | POA: Diagnosis not present

## 2023-02-19 DIAGNOSIS — E039 Hypothyroidism, unspecified: Secondary | ICD-10-CM | POA: Diagnosis not present

## 2023-02-19 DIAGNOSIS — F419 Anxiety disorder, unspecified: Secondary | ICD-10-CM | POA: Diagnosis not present

## 2023-02-19 DIAGNOSIS — G629 Polyneuropathy, unspecified: Secondary | ICD-10-CM | POA: Diagnosis not present

## 2023-02-19 DIAGNOSIS — N182 Chronic kidney disease, stage 2 (mild): Secondary | ICD-10-CM | POA: Diagnosis not present

## 2023-02-19 DIAGNOSIS — N3000 Acute cystitis without hematuria: Secondary | ICD-10-CM | POA: Diagnosis not present

## 2023-02-19 DIAGNOSIS — N179 Acute kidney failure, unspecified: Secondary | ICD-10-CM | POA: Diagnosis not present

## 2023-02-19 DIAGNOSIS — I712 Thoracic aortic aneurysm, without rupture, unspecified: Secondary | ICD-10-CM | POA: Diagnosis not present

## 2023-02-19 DIAGNOSIS — F319 Bipolar disorder, unspecified: Secondary | ICD-10-CM | POA: Diagnosis not present

## 2023-02-20 DIAGNOSIS — N179 Acute kidney failure, unspecified: Secondary | ICD-10-CM | POA: Diagnosis not present

## 2023-02-20 DIAGNOSIS — N182 Chronic kidney disease, stage 2 (mild): Secondary | ICD-10-CM | POA: Diagnosis not present

## 2023-02-20 DIAGNOSIS — F319 Bipolar disorder, unspecified: Secondary | ICD-10-CM | POA: Diagnosis not present

## 2023-02-20 DIAGNOSIS — F419 Anxiety disorder, unspecified: Secondary | ICD-10-CM | POA: Diagnosis not present

## 2023-02-20 DIAGNOSIS — G629 Polyneuropathy, unspecified: Secondary | ICD-10-CM | POA: Diagnosis not present

## 2023-02-20 DIAGNOSIS — E039 Hypothyroidism, unspecified: Secondary | ICD-10-CM | POA: Diagnosis not present

## 2023-02-20 DIAGNOSIS — N3000 Acute cystitis without hematuria: Secondary | ICD-10-CM | POA: Diagnosis not present

## 2023-02-20 DIAGNOSIS — I712 Thoracic aortic aneurysm, without rupture, unspecified: Secondary | ICD-10-CM | POA: Diagnosis not present

## 2023-02-20 DIAGNOSIS — K219 Gastro-esophageal reflux disease without esophagitis: Secondary | ICD-10-CM | POA: Diagnosis not present

## 2023-02-27 DIAGNOSIS — F419 Anxiety disorder, unspecified: Secondary | ICD-10-CM | POA: Diagnosis not present

## 2023-02-27 DIAGNOSIS — N179 Acute kidney failure, unspecified: Secondary | ICD-10-CM | POA: Diagnosis not present

## 2023-02-27 DIAGNOSIS — K219 Gastro-esophageal reflux disease without esophagitis: Secondary | ICD-10-CM | POA: Diagnosis not present

## 2023-02-27 DIAGNOSIS — G629 Polyneuropathy, unspecified: Secondary | ICD-10-CM | POA: Diagnosis not present

## 2023-02-27 DIAGNOSIS — N3000 Acute cystitis without hematuria: Secondary | ICD-10-CM | POA: Diagnosis not present

## 2023-02-27 DIAGNOSIS — E039 Hypothyroidism, unspecified: Secondary | ICD-10-CM | POA: Diagnosis not present

## 2023-02-27 DIAGNOSIS — F319 Bipolar disorder, unspecified: Secondary | ICD-10-CM | POA: Diagnosis not present

## 2023-02-27 DIAGNOSIS — N182 Chronic kidney disease, stage 2 (mild): Secondary | ICD-10-CM | POA: Diagnosis not present

## 2023-02-27 DIAGNOSIS — I712 Thoracic aortic aneurysm, without rupture, unspecified: Secondary | ICD-10-CM | POA: Diagnosis not present

## 2023-02-28 DIAGNOSIS — E039 Hypothyroidism, unspecified: Secondary | ICD-10-CM | POA: Diagnosis not present

## 2023-02-28 DIAGNOSIS — K219 Gastro-esophageal reflux disease without esophagitis: Secondary | ICD-10-CM | POA: Diagnosis not present

## 2023-02-28 DIAGNOSIS — N182 Chronic kidney disease, stage 2 (mild): Secondary | ICD-10-CM | POA: Diagnosis not present

## 2023-02-28 DIAGNOSIS — N179 Acute kidney failure, unspecified: Secondary | ICD-10-CM | POA: Diagnosis not present

## 2023-02-28 DIAGNOSIS — F419 Anxiety disorder, unspecified: Secondary | ICD-10-CM | POA: Diagnosis not present

## 2023-02-28 DIAGNOSIS — I712 Thoracic aortic aneurysm, without rupture, unspecified: Secondary | ICD-10-CM | POA: Diagnosis not present

## 2023-02-28 DIAGNOSIS — N3000 Acute cystitis without hematuria: Secondary | ICD-10-CM | POA: Diagnosis not present

## 2023-02-28 DIAGNOSIS — G629 Polyneuropathy, unspecified: Secondary | ICD-10-CM | POA: Diagnosis not present

## 2023-02-28 DIAGNOSIS — F319 Bipolar disorder, unspecified: Secondary | ICD-10-CM | POA: Diagnosis not present

## 2023-03-04 DIAGNOSIS — G629 Polyneuropathy, unspecified: Secondary | ICD-10-CM | POA: Diagnosis not present

## 2023-03-04 DIAGNOSIS — Z87891 Personal history of nicotine dependence: Secondary | ICD-10-CM | POA: Diagnosis not present

## 2023-03-04 DIAGNOSIS — E079 Disorder of thyroid, unspecified: Secondary | ICD-10-CM | POA: Diagnosis not present

## 2023-03-04 DIAGNOSIS — S0083XA Contusion of other part of head, initial encounter: Secondary | ICD-10-CM | POA: Diagnosis not present

## 2023-03-04 DIAGNOSIS — Z1231 Encounter for screening mammogram for malignant neoplasm of breast: Secondary | ICD-10-CM | POA: Diagnosis not present

## 2023-03-04 DIAGNOSIS — F319 Bipolar disorder, unspecified: Secondary | ICD-10-CM | POA: Diagnosis not present

## 2023-03-04 DIAGNOSIS — M25461 Effusion, right knee: Secondary | ICD-10-CM | POA: Diagnosis not present

## 2023-03-04 DIAGNOSIS — M1711 Unilateral primary osteoarthritis, right knee: Secondary | ICD-10-CM | POA: Diagnosis not present

## 2023-03-04 DIAGNOSIS — W19XXXA Unspecified fall, initial encounter: Secondary | ICD-10-CM | POA: Diagnosis not present

## 2023-03-04 DIAGNOSIS — F419 Anxiety disorder, unspecified: Secondary | ICD-10-CM | POA: Diagnosis not present

## 2023-03-04 DIAGNOSIS — M25561 Pain in right knee: Secondary | ICD-10-CM | POA: Diagnosis not present

## 2023-03-04 DIAGNOSIS — W01198A Fall on same level from slipping, tripping and stumbling with subsequent striking against other object, initial encounter: Secondary | ICD-10-CM | POA: Diagnosis not present

## 2023-03-04 DIAGNOSIS — Z79890 Hormone replacement therapy (postmenopausal): Secondary | ICD-10-CM | POA: Diagnosis not present

## 2023-03-04 DIAGNOSIS — Z79899 Other long term (current) drug therapy: Secondary | ICD-10-CM | POA: Diagnosis not present

## 2023-03-04 DIAGNOSIS — G319 Degenerative disease of nervous system, unspecified: Secondary | ICD-10-CM | POA: Diagnosis not present

## 2023-03-04 DIAGNOSIS — K219 Gastro-esophageal reflux disease without esophagitis: Secondary | ICD-10-CM | POA: Diagnosis not present

## 2023-03-04 DIAGNOSIS — S0993XA Unspecified injury of face, initial encounter: Secondary | ICD-10-CM | POA: Diagnosis not present

## 2023-03-04 DIAGNOSIS — Z043 Encounter for examination and observation following other accident: Secondary | ICD-10-CM | POA: Diagnosis not present

## 2023-03-05 DIAGNOSIS — R531 Weakness: Secondary | ICD-10-CM | POA: Diagnosis not present

## 2023-03-05 DIAGNOSIS — Z9981 Dependence on supplemental oxygen: Secondary | ICD-10-CM | POA: Diagnosis not present

## 2023-03-06 DIAGNOSIS — K219 Gastro-esophageal reflux disease without esophagitis: Secondary | ICD-10-CM | POA: Diagnosis not present

## 2023-03-06 DIAGNOSIS — E039 Hypothyroidism, unspecified: Secondary | ICD-10-CM | POA: Diagnosis not present

## 2023-03-06 DIAGNOSIS — N3000 Acute cystitis without hematuria: Secondary | ICD-10-CM | POA: Diagnosis not present

## 2023-03-06 DIAGNOSIS — N179 Acute kidney failure, unspecified: Secondary | ICD-10-CM | POA: Diagnosis not present

## 2023-03-06 DIAGNOSIS — F419 Anxiety disorder, unspecified: Secondary | ICD-10-CM | POA: Diagnosis not present

## 2023-03-06 DIAGNOSIS — G629 Polyneuropathy, unspecified: Secondary | ICD-10-CM | POA: Diagnosis not present

## 2023-03-06 DIAGNOSIS — N182 Chronic kidney disease, stage 2 (mild): Secondary | ICD-10-CM | POA: Diagnosis not present

## 2023-03-06 DIAGNOSIS — F319 Bipolar disorder, unspecified: Secondary | ICD-10-CM | POA: Diagnosis not present

## 2023-03-06 DIAGNOSIS — I712 Thoracic aortic aneurysm, without rupture, unspecified: Secondary | ICD-10-CM | POA: Diagnosis not present

## 2023-03-09 DIAGNOSIS — G629 Polyneuropathy, unspecified: Secondary | ICD-10-CM | POA: Diagnosis not present

## 2023-03-09 DIAGNOSIS — N182 Chronic kidney disease, stage 2 (mild): Secondary | ICD-10-CM | POA: Diagnosis not present

## 2023-03-09 DIAGNOSIS — F419 Anxiety disorder, unspecified: Secondary | ICD-10-CM | POA: Diagnosis not present

## 2023-03-09 DIAGNOSIS — I712 Thoracic aortic aneurysm, without rupture, unspecified: Secondary | ICD-10-CM | POA: Diagnosis not present

## 2023-03-09 DIAGNOSIS — K219 Gastro-esophageal reflux disease without esophagitis: Secondary | ICD-10-CM | POA: Diagnosis not present

## 2023-03-09 DIAGNOSIS — N3000 Acute cystitis without hematuria: Secondary | ICD-10-CM | POA: Diagnosis not present

## 2023-03-09 DIAGNOSIS — F319 Bipolar disorder, unspecified: Secondary | ICD-10-CM | POA: Diagnosis not present

## 2023-03-09 DIAGNOSIS — N179 Acute kidney failure, unspecified: Secondary | ICD-10-CM | POA: Diagnosis not present

## 2023-03-09 DIAGNOSIS — E039 Hypothyroidism, unspecified: Secondary | ICD-10-CM | POA: Diagnosis not present

## 2023-03-11 DIAGNOSIS — N179 Acute kidney failure, unspecified: Secondary | ICD-10-CM | POA: Diagnosis not present

## 2023-03-11 DIAGNOSIS — I712 Thoracic aortic aneurysm, without rupture, unspecified: Secondary | ICD-10-CM | POA: Diagnosis not present

## 2023-03-11 DIAGNOSIS — F319 Bipolar disorder, unspecified: Secondary | ICD-10-CM | POA: Diagnosis not present

## 2023-03-11 DIAGNOSIS — N182 Chronic kidney disease, stage 2 (mild): Secondary | ICD-10-CM | POA: Diagnosis not present

## 2023-03-11 DIAGNOSIS — G629 Polyneuropathy, unspecified: Secondary | ICD-10-CM | POA: Diagnosis not present

## 2023-03-11 DIAGNOSIS — E039 Hypothyroidism, unspecified: Secondary | ICD-10-CM | POA: Diagnosis not present

## 2023-03-11 DIAGNOSIS — F419 Anxiety disorder, unspecified: Secondary | ICD-10-CM | POA: Diagnosis not present

## 2023-03-11 DIAGNOSIS — N3000 Acute cystitis without hematuria: Secondary | ICD-10-CM | POA: Diagnosis not present

## 2023-03-11 DIAGNOSIS — K219 Gastro-esophageal reflux disease without esophagitis: Secondary | ICD-10-CM | POA: Diagnosis not present

## 2023-03-21 DIAGNOSIS — N182 Chronic kidney disease, stage 2 (mild): Secondary | ICD-10-CM | POA: Diagnosis not present

## 2023-03-21 DIAGNOSIS — G629 Polyneuropathy, unspecified: Secondary | ICD-10-CM | POA: Diagnosis not present

## 2023-03-21 DIAGNOSIS — E039 Hypothyroidism, unspecified: Secondary | ICD-10-CM | POA: Diagnosis not present

## 2023-03-21 DIAGNOSIS — N179 Acute kidney failure, unspecified: Secondary | ICD-10-CM | POA: Diagnosis not present

## 2023-03-21 DIAGNOSIS — F419 Anxiety disorder, unspecified: Secondary | ICD-10-CM | POA: Diagnosis not present

## 2023-03-21 DIAGNOSIS — I712 Thoracic aortic aneurysm, without rupture, unspecified: Secondary | ICD-10-CM | POA: Diagnosis not present

## 2023-03-21 DIAGNOSIS — F319 Bipolar disorder, unspecified: Secondary | ICD-10-CM | POA: Diagnosis not present

## 2023-03-21 DIAGNOSIS — N3000 Acute cystitis without hematuria: Secondary | ICD-10-CM | POA: Diagnosis not present

## 2023-03-21 DIAGNOSIS — K219 Gastro-esophageal reflux disease without esophagitis: Secondary | ICD-10-CM | POA: Diagnosis not present

## 2023-04-01 DIAGNOSIS — F319 Bipolar disorder, unspecified: Secondary | ICD-10-CM | POA: Diagnosis not present

## 2023-04-01 DIAGNOSIS — F419 Anxiety disorder, unspecified: Secondary | ICD-10-CM | POA: Diagnosis not present

## 2023-04-01 DIAGNOSIS — I712 Thoracic aortic aneurysm, without rupture, unspecified: Secondary | ICD-10-CM | POA: Diagnosis not present

## 2023-04-01 DIAGNOSIS — N182 Chronic kidney disease, stage 2 (mild): Secondary | ICD-10-CM | POA: Diagnosis not present

## 2023-04-01 DIAGNOSIS — N3000 Acute cystitis without hematuria: Secondary | ICD-10-CM | POA: Diagnosis not present

## 2023-04-01 DIAGNOSIS — G629 Polyneuropathy, unspecified: Secondary | ICD-10-CM | POA: Diagnosis not present

## 2023-04-01 DIAGNOSIS — N179 Acute kidney failure, unspecified: Secondary | ICD-10-CM | POA: Diagnosis not present

## 2023-04-01 DIAGNOSIS — E039 Hypothyroidism, unspecified: Secondary | ICD-10-CM | POA: Diagnosis not present

## 2023-04-01 DIAGNOSIS — K219 Gastro-esophageal reflux disease without esophagitis: Secondary | ICD-10-CM | POA: Diagnosis not present

## 2023-04-17 DIAGNOSIS — F339 Major depressive disorder, recurrent, unspecified: Secondary | ICD-10-CM | POA: Diagnosis not present

## 2023-04-17 DIAGNOSIS — Z1339 Encounter for screening examination for other mental health and behavioral disorders: Secondary | ICD-10-CM | POA: Diagnosis not present

## 2023-04-17 DIAGNOSIS — Z0000 Encounter for general adult medical examination without abnormal findings: Secondary | ICD-10-CM | POA: Diagnosis not present

## 2023-04-17 DIAGNOSIS — J449 Chronic obstructive pulmonary disease, unspecified: Secondary | ICD-10-CM | POA: Diagnosis not present

## 2023-04-17 DIAGNOSIS — I10 Essential (primary) hypertension: Secondary | ICD-10-CM | POA: Diagnosis not present

## 2023-04-17 DIAGNOSIS — Z299 Encounter for prophylactic measures, unspecified: Secondary | ICD-10-CM | POA: Diagnosis not present

## 2023-04-17 DIAGNOSIS — Z1331 Encounter for screening for depression: Secondary | ICD-10-CM | POA: Diagnosis not present

## 2023-04-17 DIAGNOSIS — Z7189 Other specified counseling: Secondary | ICD-10-CM | POA: Diagnosis not present

## 2023-05-07 NOTE — Progress Notes (Deleted)
 GI Office Note    Referring Provider: Kirstie Peri, MD Primary Care Physician:  Kirstie Peri, MD  Primary Gastroenterologist:  Chief Complaint   No chief complaint on file.   History of Present Illness   Lindsey Rowe is a 78 y.o. female presenting today for follow up. Last seen 09/2022. We tried to get her scheduled for EGD/ED for dysphagia but the facility she was residing at, called and cancelled twice. Second time after patient suffered a fall and tiny subdural hematoma 12/2022.    01/2023:     Medications   Current Outpatient Medications  Medication Sig Dispense Refill   acetaminophen (TYLENOL) 325 MG tablet Take 2 tablets (650 mg total) by mouth every 6 (six) hours as needed for mild pain, moderate pain or headache. 30 tablet 0   acetaminophen (TYLENOL) 500 MG tablet Take 1,000 mg by mouth every 6 (six) hours as needed for moderate pain.     albuterol (PROVENTIL HFA;VENTOLIN HFA) 108 (90 Base) MCG/ACT inhaler Inhale 2 puffs into the lungs every 6 (six) hours as needed for wheezing or shortness of breath. 1 Inhaler 0   BANOPHEN 25 MG capsule Take 25 mg by mouth at bedtime.     busPIRone (BUSPAR) 30 MG tablet Take 30 mg by mouth 3 (three) times daily.     cholecalciferol (VITAMIN D3) 25 MCG (1000 UNIT) tablet Take 1,000 Units by mouth daily.     cycloSPORINE (RESTASIS) 0.05 % ophthalmic emulsion Place 1 drop into both eyes 2 (two) times daily.     DULoxetine (CYMBALTA) 30 MG capsule Take 1 capsule (30 mg total) by mouth 2 (two) times daily. (Patient taking differently: Take 30 mg by mouth daily.) 60 capsule 0   lansoprazole (PREVACID) 30 MG capsule Take 1 capsule (30 mg total) by mouth daily before breakfast. 30 capsule 11   levothyroxine (SYNTHROID, LEVOTHROID) 75 MCG tablet Take 100 mcg by mouth daily before breakfast.     Multiple Vitamins-Minerals (THERA-M) TABS Take 1 tablet by mouth daily.     MYRBETRIQ 50 MG TB24 tablet Take 50 mg by mouth daily.     OVER THE  COUNTER MEDICATION Oxygen at night     oxybutynin (DITROPAN XL) 15 MG 24 hr tablet Take 15 mg by mouth daily.     Polyethyl Glycol-Propyl Glycol (SYSTANE) 0.4-0.3 % GEL ophthalmic gel Place 2 application into both eyes 3 (three) times daily as needed (dry eyes).     pravastatin (PRAVACHOL) 40 MG tablet Take 40 mg by mouth every evening.      pregabalin (LYRICA) 100 MG capsule Take 100 mg by mouth 2 (two) times daily.     QUEtiapine (SEROQUEL) 300 MG tablet Take 300 mg by mouth at bedtime.     SUMAtriptan (IMITREX) 50 MG tablet Take 50 mg by mouth daily as needed for migraine.     traMADol (ULTRAM) 50 MG tablet Take 50 mg by mouth 2 (two) times daily as needed.     triamcinolone cream (KENALOG) 0.1 % Apply 1 application topically daily.     vitamin B-12 (CYANOCOBALAMIN) 1000 MCG tablet Take 1,000 mcg by mouth daily.     ZEASORB-AF 2 % powder Apply topically.     Zinc Sulfate 220 (50 Zn) MG TABS Take 100 mg by mouth daily.     No current facility-administered medications for this visit.    Allergies   Allergies as of 05/08/2023 - Review Complete 11/30/2022  Allergen Reaction Noted   Gabapentin  Other (See Comments) 05/12/2007   Cefadroxil Itching 05/12/2007   Oxycodone Other (See Comments) 07/15/2019     Past Medical History   Past Medical History:  Diagnosis Date   Arthritis    Back pain    Bipolar 1 disorder (HCC)    Complication of anesthesia    hard to wake up anesthesia   Depression    GERD (gastroesophageal reflux disease)    Headache(784.0)    migraines and tension headaches   Hyperlipidemia    Hypertension    Hypothyroidism    Oxygen dependent    2L/[place]   Thyroid disease     Past Surgical History   Past Surgical History:  Procedure Laterality Date   APPENDECTOMY     BACK SURGERY     BRAIN SURGERY  06/2019   CRANIOTOMY Right 07/12/2019   Procedure: Guss Bunde for HEMATOMA EVACUATION SUBDURAL;  Surgeon: Jadene Pierini, MD;  Location: [facility] OR;  Service:  Neurosurgery;  Laterality: Right;   EYE SURGERY Bilateral    lens implant   GASTRIC BYPASS     LOOP RECORDER INSERTION N/A 05/25/2016   Procedure: Loop Recorder Insertion;  Surgeon: Will Jorja Loa, MD;  Location: [facility];  Service: Cardiovascular;  Laterality: N/A;   LUMBAR LAMINECTOMY/DECOMPRESSION MICRODISCECTOMY Right 07/23/2012   Procedure: LUMBAR LAMINECTOMY/DECOMPRESSION MICRODISCECTOMY 2 LEVELS;  Surgeon: Karn Cassis, MD;  Location: [facility];  Service: Neurosurgery;  Laterality: Right;  Right Lumbar three-four  lumbar four-five Laminectomy/Foraminotomy   NASAL SINUS SURGERY Bilateral    TONSILLECTOMY     WRIST SURGERY      Past Family History   Family History  Problem Relation Age of Onset   Heart disease Mother    Emphysema Father    Diabetes Sister    Other Brother        Brain tumor - s/p excision   Colon cancer Neg Hx     Past Social History   Social History   Socioeconomic History   Marital status: Widowed    Spouse name: Not on file   Number of children: 0   Years of education: 12   Highest education level: High school graduate  Occupational History   Occupation: unemployed  Tobacco Use   Smoking status: Never   Smokeless tobacco: Never  Vaping Use   Vaping status: Never Used  Substance and Sexual Activity   Alcohol use: Not Currently    Comment: social   Drug use: No    Comment: Pt + opioids as prescribed   Sexual activity: Not Currently  Other Topics Concern   Not on file  Social History Narrative   Pt lives alone in apartment in [place].   Social Drivers of Corporate investment banker Strain: Low Risk  (03/19/2019)   Overall Financial Resource Strain (CARDIA)    Difficulty of Paying Living Expenses: Not hard at all  Food Insecurity: No Food Insecurity (02/01/2023)   [facility]   Hunger Vital Sign    Worried About Running Out of Food in the Last Year: Never true    Ran Out of Food in the Last Year: Never  true  Transportation Needs: No Transportation Needs (02/01/2023)   Received from [facility] - Transportation    Lack of Transportation (Medical): No    Lack of Transportation (Non-Medical): No  Physical Activity: Inactive (03/19/2019)   Exercise Vital Sign    Days of Exercise per Week: 0 days  Minutes of Exercise per Session: 0 min  Stress: No Stress Concern Present (03/19/2019)   [employer] of Occupational Health - Occupational Stress Questionnaire    Feeling of Stress : Only a little  Social Connections: Socially Isolated (03/19/2019)   Social Connection and Isolation Panel [NHANES]    Frequency of Communication with Friends and Family: Never    Frequency of Social Gatherings with Friends and Family: Never    Attends Religious Services: 1 to 4 times per year    Active Member of [employer] or Organizations: No    Attends Banker Meetings: Never    Marital Status: Widowed  Intimate Partner Violence: Not At Risk (02/01/2023)   Received from [facility]   Humiliation, Afraid, Rape, and Kick questionnaire    Fear of Current or Ex-Partner: No    Emotionally Abused: No    Physically Abused: No    Sexually Abused: No    Review of Systems   General: Negative for anorexia, weight loss, fever, chills, fatigue, weakness. ENT: Negative for hoarseness, difficulty swallowing , nasal congestion. CV: Negative for chest pain, angina, palpitations, dyspnea on exertion, peripheral edema.  Respiratory: Negative for dyspnea at rest, dyspnea on exertion, cough, sputum, wheezing.  GI: See history of present illness. GU:  Negative for dysuria, hematuria, urinary incontinence, urinary frequency, nocturnal urination.  Endo: Negative for unusual weight change.     Physical Exam   There were no vitals taken for this visit.   General: Well-nourished, well-developed in no acute distress.  Eyes: No icterus. Mouth: Oropharyngeal mucosa moist and pink , no lesions  erythema or exudate. Lungs: Clear to auscultation bilaterally.  Heart: Regular rate and rhythm, no murmurs rubs or gallops.  Abdomen: Bowel sounds are normal, nontender, nondistended, no hepatosplenomegaly or masses,  no abdominal bruits or hernia , no rebound or guarding.  Rectal: ***  Extremities: No lower extremity edema. No clubbing or deformities. Neuro: Alert and oriented x 4   Skin: Warm and dry, no jaundice.   Psych: Alert and cooperative, normal mood and affect.  Labs   *** Imaging Studies   No results found.  Assessment       PLAN   ***   Leanna Battles. Melvyn Neth, MHS, PA-C [facility]

## 2023-05-08 ENCOUNTER — Ambulatory Visit: Payer: Medicare HMO | Admitting: Gastroenterology

## 2023-05-14 ENCOUNTER — Encounter: Payer: Self-pay | Admitting: Gastroenterology

## 2023-06-04 NOTE — Progress Notes (Deleted)
 GI Office Note    Referring Provider: Theoplis Fix, MD Primary Care Physician:  Theoplis Fix, MD  Primary Gastroenterologist:  Chief Complaint   No chief complaint on file.   History of Present Illness   Lindsey Rowe is a 78 y.o. female presenting today  for follow up. Seen 09/2022 for history of food impaction. Tried to set up for EGD/ED twice but was cancelled due to facility not arrange transportion.     Patient has never had an endoscopy.  She reports her last colonoscopy was about 10 years ago and was negative.  She is not interested in pursuing any additional colonoscopies.         Medications   Current Outpatient Medications  Medication Sig Dispense Refill   acetaminophen  (TYLENOL ) 325 MG tablet Take 2 tablets (650 mg total) by mouth every 6 (six) hours as needed for mild pain, moderate pain or headache. 30 tablet 0   acetaminophen  (TYLENOL ) 500 MG tablet Take 1,000 mg by mouth every 6 (six) hours as needed for moderate pain.     albuterol  (PROVENTIL  HFA;VENTOLIN  HFA) 108 (90 Base) MCG/ACT inhaler Inhale 2 puffs into the lungs every 6 (six) hours as needed for wheezing or shortness of breath. 1 Inhaler 0   BANOPHEN 25 MG capsule Take 25 mg by mouth at bedtime.     busPIRone  (BUSPAR ) 30 MG tablet Take 30 mg by mouth 3 (three) times daily.     cholecalciferol  (VITAMIN D3) 25 MCG (1000 UNIT) tablet Take 1,000 Units by mouth daily.     cycloSPORINE  (RESTASIS ) 0.05 % ophthalmic emulsion Place 1 drop into both eyes 2 (two) times daily.     DULoxetine  (CYMBALTA ) 30 MG capsule Take 1 capsule (30 mg total) by mouth 2 (two) times daily. (Patient taking differently: Take 30 mg by mouth daily.) 60 capsule 0   lansoprazole  (PREVACID ) 30 MG capsule Take 1 capsule (30 mg total) by mouth daily before breakfast. 30 capsule 11   levothyroxine  (SYNTHROID , LEVOTHROID) 75 MCG tablet Take 100 mcg by mouth daily before breakfast.     Multiple Vitamins-Minerals (THERA-M) TABS Take 1  tablet by mouth daily.     MYRBETRIQ 50 MG TB24 tablet Take 50 mg by mouth daily.     OVER THE COUNTER MEDICATION Oxygen  at night     oxybutynin (DITROPAN XL) 15 MG 24 hr tablet Take 15 mg by mouth daily.     Polyethyl Glycol-Propyl Glycol (SYSTANE) 0.4-0.3 % GEL ophthalmic gel Place 2 application into both eyes 3 (three) times daily as needed (dry eyes).     pravastatin  (PRAVACHOL ) 40 MG tablet Take 40 mg by mouth every evening.      pregabalin  (LYRICA ) 100 MG capsule Take 100 mg by mouth 2 (two) times daily.     QUEtiapine (SEROQUEL) 300 MG tablet Take 300 mg by mouth at bedtime.     SUMAtriptan  (IMITREX ) 50 MG tablet Take 50 mg by mouth daily as needed for migraine.     traMADol  (ULTRAM ) 50 MG tablet Take 50 mg by mouth 2 (two) times daily as needed.     triamcinolone  cream (KENALOG ) 0.1 % Apply 1 application topically daily.     vitamin B-12 (CYANOCOBALAMIN ) 1000 MCG tablet Take 1,000 mcg by mouth daily.     ZEASORB-AF 2 % powder Apply topically.     Zinc  Sulfate 220 (50 Zn) MG TABS Take 100 mg by mouth daily.     No current facility-administered medications for this  visit.    Allergies   Allergies as of 06/05/2023 - Review Complete 11/30/2022  Allergen Reaction Noted   Gabapentin Other (See Comments) 05/12/2007   Cefadroxil Itching 05/12/2007   Oxycodone  Other (See Comments) 07/15/2019     Past Medical History   Past Medical History:  Diagnosis Date   Arthritis    Back pain    Bipolar 1 disorder (HCC)    Complication of anesthesia    hard to wake up anesthesia   Depression    GERD (gastroesophageal reflux disease)    Headache(784.0)    migraines and tension headaches   Hyperlipidemia    Hypertension    Hypothyroidism    Oxygen  dependent    2L/[place]   Thyroid  disease     Past Surgical History   Past Surgical History:  Procedure Laterality Date   APPENDECTOMY     BACK SURGERY     BRAIN SURGERY  06/2019   CRANIOTOMY Right 07/12/2019   Procedure: Kasandra Pain for  HEMATOMA EVACUATION SUBDURAL;  Surgeon: Cannon Champion, MD;  Location: [facility] OR;  Service: Neurosurgery;  Laterality: Right;   EYE SURGERY Bilateral    lens implant   GASTRIC BYPASS     LOOP RECORDER INSERTION N/A 05/25/2016   Procedure: Loop Recorder Insertion;  Surgeon: Will Cortland Ding, MD;  Location: [facility];  Service: Cardiovascular;  Laterality: N/A;   LUMBAR LAMINECTOMY/DECOMPRESSION MICRODISCECTOMY Right 07/23/2012   Procedure: LUMBAR LAMINECTOMY/DECOMPRESSION MICRODISCECTOMY 2 LEVELS;  Surgeon: Adelbert Adler, MD;  Location: [facility];  Service: Neurosurgery;  Laterality: Right;  Right Lumbar three-four  lumbar four-five Laminectomy/Foraminotomy   NASAL SINUS SURGERY Bilateral    TONSILLECTOMY     WRIST SURGERY      Past Family History   Family History  Problem Relation Age of Onset   Heart disease Mother    Emphysema Father    Diabetes Sister    Other Brother        Brain tumor - s/p excision   Colon cancer Neg Hx     Past Social History   Social History   Socioeconomic History   Marital status: Widowed    Spouse name: Not on file   Number of children: 0   Years of education: 12   Highest education level: High school graduate  Occupational History   Occupation: unemployed  Tobacco Use   Smoking status: Never   Smokeless tobacco: Never  Vaping Use   Vaping status: Never Used  Substance and Sexual Activity   Alcohol  use: Not Currently    Comment: social   Drug use: No    Comment: Pt + opioids as prescribed   Sexual activity: Not Currently  Other Topics Concern   Not on file  Social History Narrative   Pt lives alone in apartment in [place].   Social Drivers of Corporate investment banker Strain: Low Risk  (03/19/2019)   Overall Financial Resource Strain (CARDIA)    Difficulty of Paying Living Expenses: Not hard at all  Food Insecurity: No Food Insecurity (02/01/2023)   Received from [facility]   Hunger Vital Sign    Worried About  Running Out of Food in the Last Year: Never true    Ran Out of Food in the Last Year: Never true  Transportation Needs: No Transportation Needs (02/01/2023)   Received from [facility] - Transportation    Lack of Transportation (Medical): No    Lack of Transportation (  Non-Medical): No  Physical Activity: Inactive (03/19/2019)   Exercise Vital Sign    Days of Exercise per Week: 0 days    Minutes of Exercise per Session: 0 min  Stress: No Stress Concern Present (03/19/2019)   [employer] of Occupational Health - Occupational Stress Questionnaire    Feeling of Stress : Only a little  Social Connections: Socially Isolated (03/19/2019)   Social Connection and Isolation Panel [NHANES]    Frequency of Communication with Friends and Family: Never    Frequency of Social Gatherings with Friends and Family: Never    Attends Religious Services: 1 to 4 times per year    Active Member of [employer] or Organizations: No    Attends Banker Meetings: Never    Marital Status: Widowed  Intimate Partner Violence: Not At Risk (02/01/2023)   Received from [facility]   Humiliation, Afraid, Rape, and Kick questionnaire    Fear of Current or Ex-Partner: No    Emotionally Abused: No    Physically Abused: No    Sexually Abused: No    Review of Systems   General: Negative for anorexia, weight loss, fever, chills, fatigue, weakness. ENT: Negative for hoarseness, difficulty swallowing , nasal congestion. CV: Negative for chest pain, angina, palpitations, dyspnea on exertion, peripheral edema.  Respiratory: Negative for dyspnea at rest, dyspnea on exertion, cough, sputum, wheezing.  GI: See history of present illness. GU:  Negative for dysuria, hematuria, urinary incontinence, urinary frequency, nocturnal urination.  Endo: Negative for unusual weight change.     Physical Exam   There were no vitals taken for this visit.   General: Well-nourished, well-developed in no acute  distress.  Eyes: No icterus. Mouth: Oropharyngeal mucosa moist and pink , no lesions erythema or exudate. Lungs: Clear to auscultation bilaterally.  Heart: Regular rate and rhythm, no murmurs rubs or gallops.  Abdomen: Bowel sounds are normal, nontender, nondistended, no hepatosplenomegaly or masses,  no abdominal bruits or hernia , no rebound or guarding.  Rectal: ***  Extremities: No lower extremity edema. No clubbing or deformities. Neuro: Alert and oriented x 4   Skin: Warm and dry, no jaundice.   Psych: Alert and cooperative, normal mood and affect.  Labs   *** Imaging Studies   No results found.  Assessment       PLAN   ***   Trudie Fuse. Harles Lied, MHS, PA-C [facility]

## 2023-06-05 ENCOUNTER — Ambulatory Visit: Admitting: Gastroenterology

## 2023-06-08 DIAGNOSIS — S1989XA Other specified injuries of other specified part of neck, initial encounter: Secondary | ICD-10-CM | POA: Diagnosis not present

## 2023-06-08 DIAGNOSIS — R55 Syncope and collapse: Secondary | ICD-10-CM | POA: Diagnosis not present

## 2023-06-08 DIAGNOSIS — Z87891 Personal history of nicotine dependence: Secondary | ICD-10-CM | POA: Diagnosis not present

## 2023-06-08 DIAGNOSIS — W19XXXA Unspecified fall, initial encounter: Secondary | ICD-10-CM | POA: Diagnosis not present

## 2023-06-08 DIAGNOSIS — Z66 Do not resuscitate: Secondary | ICD-10-CM | POA: Diagnosis not present

## 2023-06-08 DIAGNOSIS — S01312A Laceration without foreign body of left ear, initial encounter: Secondary | ICD-10-CM | POA: Diagnosis not present

## 2023-06-08 DIAGNOSIS — N3281 Overactive bladder: Secondary | ICD-10-CM | POA: Diagnosis not present

## 2023-06-08 DIAGNOSIS — F319 Bipolar disorder, unspecified: Secondary | ICD-10-CM | POA: Diagnosis not present

## 2023-06-08 DIAGNOSIS — K219 Gastro-esophageal reflux disease without esophagitis: Secondary | ICD-10-CM | POA: Diagnosis not present

## 2023-06-08 DIAGNOSIS — G4489 Other headache syndrome: Secondary | ICD-10-CM | POA: Diagnosis not present

## 2023-06-08 DIAGNOSIS — N390 Urinary tract infection, site not specified: Secondary | ICD-10-CM | POA: Diagnosis not present

## 2023-06-08 DIAGNOSIS — Z79890 Hormone replacement therapy (postmenopausal): Secondary | ICD-10-CM | POA: Diagnosis not present

## 2023-06-08 DIAGNOSIS — S098XXA Other specified injuries of head, initial encounter: Secondary | ICD-10-CM | POA: Diagnosis not present

## 2023-06-09 DIAGNOSIS — R32 Unspecified urinary incontinence: Secondary | ICD-10-CM | POA: Diagnosis not present

## 2023-06-09 DIAGNOSIS — E039 Hypothyroidism, unspecified: Secondary | ICD-10-CM | POA: Diagnosis not present

## 2023-06-09 DIAGNOSIS — R55 Syncope and collapse: Secondary | ICD-10-CM | POA: Diagnosis not present

## 2023-06-09 DIAGNOSIS — N3281 Overactive bladder: Secondary | ICD-10-CM | POA: Diagnosis not present

## 2023-06-10 DIAGNOSIS — Z79899 Other long term (current) drug therapy: Secondary | ICD-10-CM | POA: Diagnosis not present

## 2023-06-10 DIAGNOSIS — I517 Cardiomegaly: Secondary | ICD-10-CM | POA: Diagnosis not present

## 2023-06-10 DIAGNOSIS — R55 Syncope and collapse: Secondary | ICD-10-CM | POA: Diagnosis not present

## 2023-06-10 DIAGNOSIS — E039 Hypothyroidism, unspecified: Secondary | ICD-10-CM | POA: Diagnosis not present

## 2023-06-10 DIAGNOSIS — N3281 Overactive bladder: Secondary | ICD-10-CM | POA: Diagnosis not present

## 2023-06-10 DIAGNOSIS — F319 Bipolar disorder, unspecified: Secondary | ICD-10-CM | POA: Diagnosis not present

## 2023-06-10 DIAGNOSIS — N390 Urinary tract infection, site not specified: Secondary | ICD-10-CM | POA: Diagnosis not present

## 2023-06-15 DIAGNOSIS — F319 Bipolar disorder, unspecified: Secondary | ICD-10-CM | POA: Diagnosis not present

## 2023-06-15 DIAGNOSIS — G629 Polyneuropathy, unspecified: Secondary | ICD-10-CM | POA: Diagnosis not present

## 2023-06-15 DIAGNOSIS — N182 Chronic kidney disease, stage 2 (mild): Secondary | ICD-10-CM | POA: Diagnosis not present

## 2023-06-15 DIAGNOSIS — J45909 Unspecified asthma, uncomplicated: Secondary | ICD-10-CM | POA: Diagnosis not present

## 2023-06-15 DIAGNOSIS — F419 Anxiety disorder, unspecified: Secondary | ICD-10-CM | POA: Diagnosis not present

## 2023-06-15 DIAGNOSIS — N390 Urinary tract infection, site not specified: Secondary | ICD-10-CM | POA: Diagnosis not present

## 2023-06-15 DIAGNOSIS — E039 Hypothyroidism, unspecified: Secondary | ICD-10-CM | POA: Diagnosis not present

## 2023-06-15 DIAGNOSIS — N3281 Overactive bladder: Secondary | ICD-10-CM | POA: Diagnosis not present

## 2023-06-15 DIAGNOSIS — I129 Hypertensive chronic kidney disease with stage 1 through stage 4 chronic kidney disease, or unspecified chronic kidney disease: Secondary | ICD-10-CM | POA: Diagnosis not present

## 2023-06-18 DIAGNOSIS — N390 Urinary tract infection, site not specified: Secondary | ICD-10-CM | POA: Diagnosis not present

## 2023-06-18 DIAGNOSIS — R55 Syncope and collapse: Secondary | ICD-10-CM | POA: Diagnosis not present

## 2023-06-18 DIAGNOSIS — I129 Hypertensive chronic kidney disease with stage 1 through stage 4 chronic kidney disease, or unspecified chronic kidney disease: Secondary | ICD-10-CM | POA: Diagnosis not present

## 2023-06-18 DIAGNOSIS — G629 Polyneuropathy, unspecified: Secondary | ICD-10-CM | POA: Diagnosis not present

## 2023-06-18 DIAGNOSIS — N3281 Overactive bladder: Secondary | ICD-10-CM | POA: Diagnosis not present

## 2023-06-18 DIAGNOSIS — J45909 Unspecified asthma, uncomplicated: Secondary | ICD-10-CM | POA: Diagnosis not present

## 2023-06-18 DIAGNOSIS — E039 Hypothyroidism, unspecified: Secondary | ICD-10-CM | POA: Diagnosis not present

## 2023-06-18 DIAGNOSIS — F319 Bipolar disorder, unspecified: Secondary | ICD-10-CM | POA: Diagnosis not present

## 2023-06-18 DIAGNOSIS — N182 Chronic kidney disease, stage 2 (mild): Secondary | ICD-10-CM | POA: Diagnosis not present

## 2023-06-18 DIAGNOSIS — F419 Anxiety disorder, unspecified: Secondary | ICD-10-CM | POA: Diagnosis not present

## 2023-06-19 ENCOUNTER — Ambulatory Visit: Admitting: Gastroenterology

## 2023-06-20 DIAGNOSIS — N390 Urinary tract infection, site not specified: Secondary | ICD-10-CM | POA: Diagnosis not present

## 2023-06-20 DIAGNOSIS — F319 Bipolar disorder, unspecified: Secondary | ICD-10-CM | POA: Diagnosis not present

## 2023-06-20 DIAGNOSIS — E039 Hypothyroidism, unspecified: Secondary | ICD-10-CM | POA: Diagnosis not present

## 2023-06-20 DIAGNOSIS — I129 Hypertensive chronic kidney disease with stage 1 through stage 4 chronic kidney disease, or unspecified chronic kidney disease: Secondary | ICD-10-CM | POA: Diagnosis not present

## 2023-06-20 DIAGNOSIS — G629 Polyneuropathy, unspecified: Secondary | ICD-10-CM | POA: Diagnosis not present

## 2023-06-20 DIAGNOSIS — J45909 Unspecified asthma, uncomplicated: Secondary | ICD-10-CM | POA: Diagnosis not present

## 2023-06-20 DIAGNOSIS — F419 Anxiety disorder, unspecified: Secondary | ICD-10-CM | POA: Diagnosis not present

## 2023-06-20 DIAGNOSIS — N3281 Overactive bladder: Secondary | ICD-10-CM | POA: Diagnosis not present

## 2023-06-20 DIAGNOSIS — N182 Chronic kidney disease, stage 2 (mild): Secondary | ICD-10-CM | POA: Diagnosis not present

## 2023-06-24 ENCOUNTER — Ambulatory Visit: Admitting: Gastroenterology

## 2023-06-27 DIAGNOSIS — N182 Chronic kidney disease, stage 2 (mild): Secondary | ICD-10-CM | POA: Diagnosis not present

## 2023-06-27 DIAGNOSIS — E039 Hypothyroidism, unspecified: Secondary | ICD-10-CM | POA: Diagnosis not present

## 2023-06-27 DIAGNOSIS — G629 Polyneuropathy, unspecified: Secondary | ICD-10-CM | POA: Diagnosis not present

## 2023-06-27 DIAGNOSIS — J45909 Unspecified asthma, uncomplicated: Secondary | ICD-10-CM | POA: Diagnosis not present

## 2023-06-27 DIAGNOSIS — F319 Bipolar disorder, unspecified: Secondary | ICD-10-CM | POA: Diagnosis not present

## 2023-06-27 DIAGNOSIS — I129 Hypertensive chronic kidney disease with stage 1 through stage 4 chronic kidney disease, or unspecified chronic kidney disease: Secondary | ICD-10-CM | POA: Diagnosis not present

## 2023-06-27 DIAGNOSIS — N390 Urinary tract infection, site not specified: Secondary | ICD-10-CM | POA: Diagnosis not present

## 2023-06-27 DIAGNOSIS — F419 Anxiety disorder, unspecified: Secondary | ICD-10-CM | POA: Diagnosis not present

## 2023-06-27 DIAGNOSIS — N3281 Overactive bladder: Secondary | ICD-10-CM | POA: Diagnosis not present

## 2023-06-28 DIAGNOSIS — J45909 Unspecified asthma, uncomplicated: Secondary | ICD-10-CM | POA: Diagnosis not present

## 2023-06-28 DIAGNOSIS — N182 Chronic kidney disease, stage 2 (mild): Secondary | ICD-10-CM | POA: Diagnosis not present

## 2023-06-28 DIAGNOSIS — N3281 Overactive bladder: Secondary | ICD-10-CM | POA: Diagnosis not present

## 2023-06-28 DIAGNOSIS — F319 Bipolar disorder, unspecified: Secondary | ICD-10-CM | POA: Diagnosis not present

## 2023-06-28 DIAGNOSIS — G629 Polyneuropathy, unspecified: Secondary | ICD-10-CM | POA: Diagnosis not present

## 2023-06-28 DIAGNOSIS — F419 Anxiety disorder, unspecified: Secondary | ICD-10-CM | POA: Diagnosis not present

## 2023-06-28 DIAGNOSIS — I129 Hypertensive chronic kidney disease with stage 1 through stage 4 chronic kidney disease, or unspecified chronic kidney disease: Secondary | ICD-10-CM | POA: Diagnosis not present

## 2023-06-28 DIAGNOSIS — N390 Urinary tract infection, site not specified: Secondary | ICD-10-CM | POA: Diagnosis not present

## 2023-06-28 DIAGNOSIS — E039 Hypothyroidism, unspecified: Secondary | ICD-10-CM | POA: Diagnosis not present

## 2023-07-02 DIAGNOSIS — E039 Hypothyroidism, unspecified: Secondary | ICD-10-CM | POA: Diagnosis not present

## 2023-07-02 DIAGNOSIS — I129 Hypertensive chronic kidney disease with stage 1 through stage 4 chronic kidney disease, or unspecified chronic kidney disease: Secondary | ICD-10-CM | POA: Diagnosis not present

## 2023-07-02 DIAGNOSIS — F319 Bipolar disorder, unspecified: Secondary | ICD-10-CM | POA: Diagnosis not present

## 2023-07-02 DIAGNOSIS — G629 Polyneuropathy, unspecified: Secondary | ICD-10-CM | POA: Diagnosis not present

## 2023-07-02 DIAGNOSIS — N390 Urinary tract infection, site not specified: Secondary | ICD-10-CM | POA: Diagnosis not present

## 2023-07-02 DIAGNOSIS — J45909 Unspecified asthma, uncomplicated: Secondary | ICD-10-CM | POA: Diagnosis not present

## 2023-07-02 DIAGNOSIS — N182 Chronic kidney disease, stage 2 (mild): Secondary | ICD-10-CM | POA: Diagnosis not present

## 2023-07-02 DIAGNOSIS — N3281 Overactive bladder: Secondary | ICD-10-CM | POA: Diagnosis not present

## 2023-07-02 DIAGNOSIS — F419 Anxiety disorder, unspecified: Secondary | ICD-10-CM | POA: Diagnosis not present

## 2023-07-08 ENCOUNTER — Ambulatory Visit: Admitting: Gastroenterology

## 2023-07-15 DIAGNOSIS — I129 Hypertensive chronic kidney disease with stage 1 through stage 4 chronic kidney disease, or unspecified chronic kidney disease: Secondary | ICD-10-CM | POA: Diagnosis not present

## 2023-07-15 DIAGNOSIS — N182 Chronic kidney disease, stage 2 (mild): Secondary | ICD-10-CM | POA: Diagnosis not present

## 2023-07-15 DIAGNOSIS — N3281 Overactive bladder: Secondary | ICD-10-CM | POA: Diagnosis not present

## 2023-07-15 DIAGNOSIS — G629 Polyneuropathy, unspecified: Secondary | ICD-10-CM | POA: Diagnosis not present

## 2023-07-15 DIAGNOSIS — J45909 Unspecified asthma, uncomplicated: Secondary | ICD-10-CM | POA: Diagnosis not present

## 2023-07-15 DIAGNOSIS — N390 Urinary tract infection, site not specified: Secondary | ICD-10-CM | POA: Diagnosis not present

## 2023-07-15 DIAGNOSIS — F419 Anxiety disorder, unspecified: Secondary | ICD-10-CM | POA: Diagnosis not present

## 2023-07-15 DIAGNOSIS — F319 Bipolar disorder, unspecified: Secondary | ICD-10-CM | POA: Diagnosis not present

## 2023-07-15 DIAGNOSIS — E039 Hypothyroidism, unspecified: Secondary | ICD-10-CM | POA: Diagnosis not present

## 2023-07-16 DIAGNOSIS — I129 Hypertensive chronic kidney disease with stage 1 through stage 4 chronic kidney disease, or unspecified chronic kidney disease: Secondary | ICD-10-CM | POA: Diagnosis not present

## 2023-07-16 DIAGNOSIS — N390 Urinary tract infection, site not specified: Secondary | ICD-10-CM | POA: Diagnosis not present

## 2023-07-16 DIAGNOSIS — N182 Chronic kidney disease, stage 2 (mild): Secondary | ICD-10-CM | POA: Diagnosis not present

## 2023-07-16 DIAGNOSIS — F419 Anxiety disorder, unspecified: Secondary | ICD-10-CM | POA: Diagnosis not present

## 2023-07-16 DIAGNOSIS — G629 Polyneuropathy, unspecified: Secondary | ICD-10-CM | POA: Diagnosis not present

## 2023-07-16 DIAGNOSIS — N3281 Overactive bladder: Secondary | ICD-10-CM | POA: Diagnosis not present

## 2023-07-16 DIAGNOSIS — F319 Bipolar disorder, unspecified: Secondary | ICD-10-CM | POA: Diagnosis not present

## 2023-07-16 DIAGNOSIS — J45909 Unspecified asthma, uncomplicated: Secondary | ICD-10-CM | POA: Diagnosis not present

## 2023-07-16 DIAGNOSIS — E039 Hypothyroidism, unspecified: Secondary | ICD-10-CM | POA: Diagnosis not present

## 2023-07-18 DIAGNOSIS — N182 Chronic kidney disease, stage 2 (mild): Secondary | ICD-10-CM | POA: Diagnosis not present

## 2023-07-18 DIAGNOSIS — E030 Congenital hypothyroidism with diffuse goiter: Secondary | ICD-10-CM | POA: Diagnosis not present

## 2023-07-18 DIAGNOSIS — F319 Bipolar disorder, unspecified: Secondary | ICD-10-CM | POA: Diagnosis not present

## 2023-07-18 DIAGNOSIS — I129 Hypertensive chronic kidney disease with stage 1 through stage 4 chronic kidney disease, or unspecified chronic kidney disease: Secondary | ICD-10-CM | POA: Diagnosis not present

## 2023-07-24 ENCOUNTER — Ambulatory Visit (INDEPENDENT_AMBULATORY_CARE_PROVIDER_SITE_OTHER): Admitting: Gastroenterology

## 2023-07-24 ENCOUNTER — Ambulatory Visit (INDEPENDENT_AMBULATORY_CARE_PROVIDER_SITE_OTHER)

## 2023-07-24 ENCOUNTER — Ambulatory Visit
Admission: EM | Admit: 2023-07-24 | Discharge: 2023-07-24 | Disposition: A | Discharge status: Home / Self Care | Attending: Family Medicine | Admitting: Family Medicine

## 2023-07-24 ENCOUNTER — Encounter: Payer: Self-pay | Admitting: Gastroenterology

## 2023-07-24 ENCOUNTER — Ambulatory Visit: Payer: Self-pay | Admitting: Family Medicine

## 2023-07-24 VITALS — BP 94/59 | HR 77 | Temp 97.5°F | Ht 63.5 in | Wt 197.0 lb

## 2023-07-24 DIAGNOSIS — M79641 Pain in right hand: Secondary | ICD-10-CM

## 2023-07-24 DIAGNOSIS — R1310 Dysphagia, unspecified: Secondary | ICD-10-CM | POA: Diagnosis not present

## 2023-07-24 DIAGNOSIS — I712 Thoracic aortic aneurysm, without rupture, unspecified: Secondary | ICD-10-CM

## 2023-07-24 DIAGNOSIS — S0033XA Contusion of nose, initial encounter: Secondary | ICD-10-CM

## 2023-07-24 DIAGNOSIS — R1319 Other dysphagia: Secondary | ICD-10-CM

## 2023-07-24 DIAGNOSIS — W19XXXA Unspecified fall, initial encounter: Secondary | ICD-10-CM

## 2023-07-24 DIAGNOSIS — R112 Nausea with vomiting, unspecified: Secondary | ICD-10-CM

## 2023-07-24 DIAGNOSIS — K219 Gastro-esophageal reflux disease without esophagitis: Secondary | ICD-10-CM | POA: Insufficient documentation

## 2023-07-24 NOTE — Patient Instructions (Signed)
 We will get you scheduled for upper endoscopy in near future. As soon as this is done, we will be able to complete letter for your knee surgery.   Please take your medication one at a time to help them go down better.   Avoid raw vegetables, dry tough meats.   Chew food thoroughly. Eat slowly.   Continue lansoprazole  once daily before breakfast.

## 2023-07-24 NOTE — ED Provider Notes (Signed)
 [facility]    CSN: [id number] Arrival date & time: 07/24/23  1312      History   Chief Complaint No chief complaint on file.   HPI Lindsey Rowe is a 79 y.o. female.   Patient presenting today with right hand soreness and bruising, nasal bridge bruising after a fall that occurred 3 days ago.  She denies loss of consciousness, headache, dizziness, nausea, vomiting, extremity weakness numbness or tingling, decreased range of motion, epistaxis.  So far not trying anything over-the-counter for symptoms.    Past Medical History:  Diagnosis Date   Arthritis    Back pain    Bipolar 1 disorder (HCC)    Complication of anesthesia    hard to wake up anesthesia   Depression    GERD (gastroesophageal reflux disease)    Headache(784.0)    migraines and tension headaches   Hyperlipidemia    Hypertension    Hypothyroidism    Oxygen  dependent    2L/[place]   Thyroid  disease     Patient Active Problem List   Diagnosis Date Noted   GERD (gastroesophageal reflux disease) 07/24/2023   Nausea with vomiting 07/24/2023   Dysphagia 09/25/2022   Hammer toes, bilateral 09/25/2021   Chronic kidney disease due to hypertension 01/22/2020   Chronic kidney disease, stage 3 unspecified (HCC) 01/22/2020   Fibromyalgia 01/22/2020   Gout 01/22/2020   Insomnia 01/22/2020   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   Osteoarthritis 01/22/2020   Osteoporosis 01/22/2020   Spinal stenosis 01/22/2020   Thoracic aortic aneurysm without rupture (HCC) 01/22/2020   Chronic pain of both knees 11/25/2019   Subdural hematoma (HCC) 06/28/2019   Kidney disease 05/14/2019   Hyperlipidemia 04/29/2019   COPD exacerbation (HCC) 10/08/2018   Asthmatic bronchitis 12/02/2017   Closed fracture of right talus 07/31/2017   Acute respiratory failure with hypoxia (HCC) 06/27/2017   Diastolic dysfunction 06/27/2017   Acute lower UTI 06/27/2017   Acute metabolic  encephalopathy 06/27/2017   Altered mental state 06/10/2017   Pain in lower limb 05/25/2017   Dyspnea and respiratory abnormalities 08/29/2016   Hypoxemia 08/29/2016   Shoulder dislocation, right, initial encounter 05/24/2016   Hill Sachs deformity, 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   Hyperglycemia 05/24/2016   Anterior dislocation of right shoulder    Syncope 03/24/2016   Generalized weakness 09/24/2015   Leg pain, bilateral 09/24/2015   Depression 09/24/2015   Loss of appetite 09/24/2015   Leg weakness, bilateral    Hypothyroid 12/28/2012   Hypovolemia 12/28/2012   Hypotension 08/13/2012   Dehydration 08/13/2012   AKI (acute kidney injury) (HCC) 08/13/2012   Hypoventilation associated with obesity syndrome (HCC) 08/13/2012   Acute respiratory failure (HCC) 07/26/2012   Chest pain 07/26/2012   Sprain of ankle 03/16/2010   Diverticulosis of colon 10/31/2009   Disorder of skin or subcutaneous tissue 09/23/2009   Shoulder pain, right 07/05/2009   COUGH 03/29/2009   Migraine 03/11/2009   Otitis media 12/13/2008   Contusion 12/06/2008   ACID REFLUX DISEASE 09/13/2008   Allergic rhinitis 05/20/2008   Disease of hair and hair follicles 05/20/2008   DERMATITIS 11/12/2007   OBESITY 07/18/2007   HYPERCHOLESTEROLEMIA 05/13/2007   BIPOLAR AFFECTIVE DISORDER 05/12/2007   HYPERTENSION, BENIGN ESSENTIAL 05/12/2007   Low back pain potentially associated with radiculopathy 05/12/2007    Past Surgical History:  Procedure Laterality Date   APPENDECTOMY     BACK SURGERY     BRAIN SURGERY  06/2019   CRANIOTOMY Right  07/12/2019   Procedure: Kasandra Pain for HEMATOMA EVACUATION SUBDURAL;  Surgeon: Cannon Champion, MD;  Location: [facility] OR;  Service: Neurosurgery;  Laterality: Right;   EYE SURGERY Bilateral    lens implant   GASTRIC BYPASS     LOOP RECORDER INSERTION N/A 05/25/2016   Procedure: Loop Recorder Insertion;  Surgeon: Will Cortland Ding, MD;  Location: [facility];  Service: Cardiovascular;   Laterality: N/A;   LUMBAR LAMINECTOMY/DECOMPRESSION MICRODISCECTOMY Right 07/23/2012   Procedure: LUMBAR LAMINECTOMY/DECOMPRESSION MICRODISCECTOMY 2 LEVELS;  Surgeon: Adelbert Adler, MD;  Location: [facility];  Service: Neurosurgery;  Laterality: Right;  Right Lumbar three-four  lumbar four-five Laminectomy/Foraminotomy   NASAL SINUS SURGERY Bilateral    TONSILLECTOMY     WRIST SURGERY      OB History   No obstetric history on file.      Home Medications    Prior to Admission medications   Medication Sig Start Date End Date Taking? Authorizing Provider  acetaminophen  (TYLENOL ) 325 MG tablet Take 2 tablets (650 mg total) by mouth every 6 (six) hours as needed for mild pain, moderate pain or headache. 05/02/20   Loman Risk, MD  acetaminophen  (TYLENOL ) 500 MG tablet Take 1,000 mg by mouth every 6 (six) hours as needed for moderate pain.    [provider]  albuterol  (PROVENTIL  HFA;VENTOLIN  HFA) 108 (90 Base) MCG/ACT inhaler Inhale 2 puffs into the lungs every 6 (six) hours as needed for wheezing or shortness of breath. 04/07/18   Dalene Duck, MD  BANOPHEN  25 MG capsule Take 25 mg by mouth at bedtime. 05/16/22   [provider]  busPIRone  (BUSPAR ) 30 MG tablet Take 30 mg by mouth 3 (three) times daily. 06/04/20   [provider]  cycloSPORINE  (RESTASIS ) 0.05 % ophthalmic emulsion Place 1 drop into both eyes 2 (two) times daily.    [provider]  DULoxetine  (CYMBALTA ) 60 MG capsule Take 60 mg by mouth daily.    [provider]  lansoprazole  (PREVACID ) 30 MG capsule Take 1 capsule (30 mg total) by mouth daily before breakfast. 09/25/22   Lanney Pitts, PA-C  levothyroxine  (SYNTHROID ) 100 MCG tablet Take 100 mcg by mouth daily. 07/19/23   [provider]  Multiple Vitamins-Minerals (THERA-M) TABS Take 1 tablet by mouth daily. 09/15/21   [provider]  pregabalin  (LYRICA ) 100 MG capsule Take 100 mg by mouth 2 (two) times  daily. 06/02/20   [provider]  QUEtiapine (SEROQUEL) 300 MG tablet Take 300 mg by mouth at bedtime. 09/15/21   [provider]  SUMAtriptan  (IMITREX ) 50 MG tablet Take 50 mg by mouth daily as needed for migraine. 06/01/20   [provider]  tamsulosin (FLOMAX) 0.4 MG CAPS capsule Take 0.4 mg by mouth daily.    [provider]  tolterodine (DETROL LA) 4 MG 24 hr capsule Take 4 mg by mouth daily. 07/19/23   [provider]  traMADol  (ULTRAM ) 50 MG tablet Take 50 mg by mouth 2 (two) times daily as needed. 08/30/21   [provider]  triamcinolone  cream (KENALOG ) 0.1 % Apply 1 application topically daily. 06/01/20   [provider]  ZEASORB-AF 2 % powder Apply topically. 05/28/22   [provider]  Zinc  Sulfate 220 (50 Zn) MG TABS Take 100 mg by mouth daily. 09/15/21   [provider]    Family History Family History  Problem Relation Age of Onset   Heart disease Mother    Emphysema  Father    Diabetes Sister    Other Brother        Brain tumor - s/p excision   Colon cancer Neg Hx     Social History Social History   Tobacco Use   Smoking status: Never   Smokeless tobacco: Never  Vaping Use   Vaping status: Never Used  Substance Use Topics   Alcohol  use: Not Currently    Comment: social   Drug use: No    Comment: Pt + opioids as prescribed     Allergies   Gabapentin, Cefadroxil, and Oxycodone    Review of Systems Review of Systems Per HPI  Physical Exam Triage Vital Signs ED Triage Vitals  Encounter Vitals Group     BP 07/24/23 1340 99/63     Girls Systolic BP Percentile --      Girls Diastolic BP Percentile --      Boys Systolic BP Percentile --      Boys Diastolic BP Percentile --      Pulse Rate 07/24/23 1340 78     Resp 07/24/23 1340 16     Temp 07/24/23 1340 99 F (37.2 C)     Temp Source 07/24/23 1340 Oral     SpO2 07/24/23 1340 93 %     Weight --      Height --      Head  Circumference --      Peak Flow --      Pain Score 07/24/23 1345 6     Pain Loc --      Pain Education --      Exclude from Growth Chart --    No data found.  Updated Vital Signs BP 99/63 (BP Location: Right Arm)   Pulse 78   Temp 99 F (37.2 C) (Oral)   Resp 16   SpO2 93%   Visual Acuity Right Eye Distance:   Left Eye Distance:   Bilateral Distance:    Right Eye Near:   Left Eye Near:    Bilateral Near:     Physical Exam Vitals and nursing note reviewed.  Constitutional:      Appearance: Normal appearance. She is not ill-appearing.  HENT:     Head: Atraumatic.   Eyes:     Extraocular Movements: Extraocular movements intact.     Conjunctiva/sclera: Conjunctivae normal.     Pupils: Pupils are equal, round, and reactive to light.    Cardiovascular:     Rate and Rhythm: Normal rate and regular rhythm.     Heart sounds: Normal heart sounds.  Pulmonary:     Effort: Pulmonary effort is normal.     Breath sounds: Normal breath sounds.   Musculoskeletal:        General: Swelling, tenderness and signs of injury present. Normal range of motion.     Cervical back: Normal range of motion and neck supple.     Comments: Very minimal tenderness to palpation to the proximal aspect of the right thumb and base of thumb, diffuse trace edema to the right hand and wrist with bruising throughout this area diffusely.  No bony deformity palpable, range of motion intact   Skin:    General: Skin is warm and dry.     Comments: Also has bruising and swelling to the bridge of nose with no bony deformity, nares patent bilaterally to occlusion   Neurological:     Mental Status: She is alert and oriented to person, place, and time.  Comments: All 4 extremities neurovascularly intact  Psychiatric:        Mood and Affect: Mood normal.        Thought Content: Thought content normal.        Judgment: Judgment normal.      UC Treatments / Results  Labs (all labs ordered are listed,  but only abnormal results are displayed) Labs Reviewed - No data to display  EKG   Radiology No results found.  Procedures Procedures (including critical care time)  Medications Ordered in UC Medications - No data to display  Initial Impression / Assessment and Plan / UC Course  I have reviewed the triage vital signs and the nursing notes.  Pertinent labs & imaging results that were available during my care of the patient were reviewed by me and considered in my medical decision making (see chart for details).     X-ray of the right hand personally reviewed with no obvious fracture, will await radiology read for confirmation.  Ace wrap applied, discussed RICE protocol, over-the-counter pain relievers.  Very low suspicion for a nasal fracture, discussed ice off-and-on to help with swelling.  Return for worsening symptoms.  Final Clinical Impressions(s) / UC Diagnoses   Final diagnoses:  Right hand pain  Contusion of nose, initial encounter  Fall, initial encounter     Discharge Instructions      We will let you know if your x-ray comes back abnormal.  Ice, elevate, over-the-counter pain relievers as needed.    ED Prescriptions   None    PDMP not reviewed this encounter.   Corbin Dess, [place] 07/24/23 1458

## 2023-07-24 NOTE — Progress Notes (Unsigned)
 GI Office Note    Referring Provider: Theoplis Fix, MD Primary Care Physician:  Theoplis Fix, MD  Primary Gastroenterologist:  Chief Complaint   Chief Complaint  Patient presents with   Dysphagia    Food is getting hung up in her throat    History of Present Illness   Lindsey Rowe is a 78 y.o. female presenting today for follow up. Seen back in 09/2022 for suspected esophageal stricture with ED visit for suspected food impaction.   We had plans for EGD/ED after last ov, procedure on hold due to fall and tiny subdural hematoma and likely rib fractures 12/2022. Recommended ov to reassess timing of EGD. She no showed appt in 01/2023, 05/2023. Several appointment cancellations May and June 2025.   Labs***   Vaughn twice in the last month, once lost balance with pulling brief.   Regurgitaion/vomiting towafs end of meal.  Solid food dysphagia. Grease/spicy foods, vomiting. Takes 6-7 pills at time, four capsules come back up.  No haertburn.  Odyonphagia before vomiting. BM regular. No melena, brbpr. No wt loss    Last colonoscopy reported about 10 years ago and negative per patient. She is not intrested in pursuing additional colonoscopies.   Medications   Current Outpatient Medications  Medication Sig Dispense Refill   acetaminophen  (TYLENOL ) 325 MG tablet Take 2 tablets (650 mg total) by mouth every 6 (six) hours as needed for mild pain, moderate pain or headache. 30 tablet 0   acetaminophen  (TYLENOL ) 500 MG tablet Take 1,000 mg by mouth every 6 (six) hours as needed for moderate pain.     albuterol  (PROVENTIL  HFA;VENTOLIN  HFA) 108 (90 Base) MCG/ACT inhaler Inhale 2 puffs into the lungs every 6 (six) hours as needed for wheezing or shortness of breath. 1 Inhaler 0   BANOPHEN  25 MG capsule Take 25 mg by mouth at bedtime.     busPIRone  (BUSPAR ) 30 MG tablet Take 30 mg by mouth 3 (three) times daily.     cycloSPORINE  (RESTASIS ) 0.05 % ophthalmic emulsion Place 1 drop  into both eyes 2 (two) times daily.     DULoxetine  (CYMBALTA ) 60 MG capsule Take 60 mg by mouth daily.     lansoprazole  (PREVACID ) 30 MG capsule Take 1 capsule (30 mg total) by mouth daily before breakfast. 30 capsule 11   levothyroxine  (SYNTHROID ) 100 MCG tablet Take 100 mcg by mouth daily.     Multiple Vitamins-Minerals (THERA-M) TABS Take 1 tablet by mouth daily.     pregabalin  (LYRICA ) 100 MG capsule Take 100 mg by mouth 2 (two) times daily.     QUEtiapine (SEROQUEL) 300 MG tablet Take 300 mg by mouth at bedtime.     SUMAtriptan  (IMITREX ) 50 MG tablet Take 50 mg by mouth daily as needed for migraine.     tamsulosin (FLOMAX) 0.4 MG CAPS capsule Take 0.4 mg by mouth daily.     tolterodine (DETROL LA) 4 MG 24 hr capsule Take 4 mg by mouth daily.     traMADol  (ULTRAM ) 50 MG tablet Take 50 mg by mouth 2 (two) times daily as needed.     triamcinolone  cream (KENALOG ) 0.1 % Apply 1 application topically daily.     ZEASORB-AF 2 % powder Apply topically.     Zinc  Sulfate 220 (50 Zn) MG TABS Take 100 mg by mouth daily.     No current facility-administered medications for this visit.    Allergies   Allergies as of 07/24/2023 - Review Complete 07/24/2023  Allergen Reaction Noted   Gabapentin Other (See Comments) 05/12/2007   Cefadroxil Itching 05/12/2007   Oxycodone  Other (See Comments) 07/15/2019     Past Medical History   Past Medical History:  Diagnosis Date   Arthritis    Back pain    Bipolar 1 disorder (HCC)    Complication of anesthesia    hard to wake up anesthesia   Depression    GERD (gastroesophageal reflux disease)    Headache(784.0)    migraines and tension headaches   Hyperlipidemia    Hypertension    Hypothyroidism    Oxygen  dependent    2L/[place]   Thyroid  disease     Past Surgical History   Past Surgical History:  Procedure Laterality Date   APPENDECTOMY     BACK SURGERY     BRAIN SURGERY  06/2019   CRANIOTOMY Right 07/12/2019   Procedure: Kasandra Pain for  HEMATOMA EVACUATION SUBDURAL;  Surgeon: Cannon Champion, MD;  Location: [facility] OR;  Service: Neurosurgery;  Laterality: Right;   EYE SURGERY Bilateral    lens implant   GASTRIC BYPASS     LOOP RECORDER INSERTION N/A 05/25/2016   Procedure: Loop Recorder Insertion;  Surgeon: Will Cortland Ding, MD;  Location: [facility] INVASIVE CV LAB;  Service: Cardiovascular;  Laterality: N/A;   LUMBAR LAMINECTOMY/DECOMPRESSION MICRODISCECTOMY Right 07/23/2012   Procedure: LUMBAR LAMINECTOMY/DECOMPRESSION MICRODISCECTOMY 2 LEVELS;  Surgeon: Adelbert Adler, MD;  Location: [facility] NEURO ORS;  Service: Neurosurgery;  Laterality: Right;  Right Lumbar three-four  lumbar four-five Laminectomy/Foraminotomy   NASAL SINUS SURGERY Bilateral    TONSILLECTOMY     WRIST SURGERY      Past Family History   Family History  Problem Relation Age of Onset   Heart disease Mother    Emphysema Father    Diabetes Sister    Other Brother        Brain tumor - s/p excision   Colon cancer Neg Hx     Past Social History   Social History   Socioeconomic History   Marital status: Widowed    Spouse name: Not on file   Number of children: 0   Years of education: 12   Highest education level: High school graduate  Occupational History   Occupation: unemployed  Tobacco Use   Smoking status: Never   Smokeless tobacco: Never  Vaping Use   Vaping status: Never Used  Substance and Sexual Activity   Alcohol  use: Not Currently    Comment: social   Drug use: No    Comment: Pt + opioids as prescribed   Sexual activity: Not Currently  Other Topics Concern   Not on file  Social History Narrative   Pt lives alone in apartment in [place].   Social Drivers of Corporate investment banker Strain: Low Risk  (06/10/2023)   Received from [facility]   Overall Financial Resource Strain (CARDIA)    Difficulty of Paying Living Expenses: Not hard at all  Food Insecurity: No Food Insecurity (06/10/2023)   Received from [facility]    Hunger Vital Sign    Within the past 12 months, you worried that your food would run out before you got the money to buy more.: Never true    Within the past 12 months, the food you bought just didn't last and you didn't have money to get more.: Never true  Transportation Needs: No Transportation Needs (06/10/2023)   Received from [facility] - Transportation  Lack of Transportation (Medical): No    Lack of Transportation (Non-Medical): No  Physical Activity: Inactive (06/09/2023)   Received from [facility]   Exercise Vital Sign    On average, how many days per week do you engage in moderate to strenuous exercise (like a brisk walk)?: 0 days    On average, how many minutes do you engage in exercise at this level?: 0 min  Stress: No Stress Concern Present (06/09/2023)   Received from [facility] - Occupational Stress Questionnaire    Feeling of Stress : Not at all  Social Connections: Unknown (06/09/2023)   Received from [facility]   Social Connection and Isolation Panel    Frequency of Communication with Friends and Family: Not on file    How often do you get together with friends or relatives?: Once a week    How often do you attend church or religious services?: Never    Do you belong to any clubs or organizations such as church groups, unions, fraternal or athletic groups, or school groups?: No    How often do you attend meetings of the clubs or organizations you belong to?: Never    Are you married, widowed, divorced, separated, never married, or living with a partner?: Widowed  Intimate Partner Violence: Not At Risk (06/10/2023)   Received from [facility]   Humiliation, Afraid, Rape, and Kick questionnaire    Within the last year, have you been afraid of your partner or ex-partner?: No    Within the last year, have you been humiliated or emotionally abused in other ways by your partner or ex-partner?: No    Within  the last year, have you been kicked, hit, slapped, or otherwise physically hurt by your partner or ex-partner?: No    Within the last year, have you been raped or forced to have any kind of sexual activity by your partner or ex-partner?: No    Review of Systems   General: Negative for anorexia, weight loss, fever, chills, fatigue, weakness. ENT: Negative for hoarseness, difficulty swallowing , nasal congestion. CV: Negative for chest pain, angina, palpitations, dyspnea on exertion, peripheral edema.  Respiratory: Negative for dyspnea at rest, dyspnea on exertion, cough, sputum, wheezing.  GI: See history of present illness. GU:  Negative for dysuria, hematuria, urinary incontinence, urinary frequency, nocturnal urination.  Endo: Negative for unusual weight change.     Physical Exam   BP (!) 94/59 (BP Location: Left Arm, Patient Position: Sitting, Cuff Size: Normal)   Pulse 77   Temp (!) 97.5 F (36.4 C) (Oral)   Ht 5' 3.5 (1.613 m)   Wt 197 lb (89.4 kg) Comment: per pt, states was taken last month  SpO2 90%   BMI 34.35 kg/m    General: Well-nourished, well-developed in no acute distress.  Eyes: No icterus. Mouth: Oropharyngeal mucosa moist and pink   Lungs: Clear to auscultation bilaterally.  Heart: Regular rate and rhythm, no murmurs rubs or gallops.  Abdomen: Bowel sounds are normal, nontender, nondistended, no hepatosplenomegaly or masses,  no abdominal bruits or hernia , no rebound or guarding.  Rectal: not performed Extremities: No lower extremity edema. No clubbing or deformities. Neuro: Alert and oriented x 4   Skin: Warm and dry, no jaundice.   Psych: Alert and cooperative, normal mood and affect.  Labs   *** Imaging Studies   No results found.  Assessment/Plan:        ***  Trudie Fuse. Harles Lied, MHS, PA-C [facility]

## 2023-07-24 NOTE — ED Triage Notes (Signed)
 Pt reports fall on Monday morning. Pt states she feel on her right hand, swelling and bruising present. After rolling off the hand her walker fell on her landing on her face hitting the bridge of her nose.

## 2023-07-24 NOTE — Discharge Instructions (Signed)
 We will let you know if your x-ray comes back abnormal.  Ice, elevate, over-the-counter pain relievers as needed.

## 2023-07-25 ENCOUNTER — Telehealth: Payer: Self-pay | Admitting: *Deleted

## 2023-07-25 ENCOUNTER — Encounter: Payer: Self-pay | Admitting: *Deleted

## 2023-07-25 NOTE — Telephone Encounter (Signed)
 [facility] PA: Approved Authorization #[id number]  Tracking #[id number] Dates of service 08/05/2023 - 12/02/2023

## 2023-07-25 NOTE — Telephone Encounter (Signed)
 Spoke with Ty at [facility] regarding scheduling pt for EGD. Pt has been scheduled for Monday 08/05/23. Instructions and pre-op sent via email at [email or web address]

## 2023-07-29 ENCOUNTER — Telehealth: Payer: Self-pay | Admitting: Gastroenterology

## 2023-07-29 ENCOUNTER — Encounter: Payer: Self-pay | Admitting: Gastroenterology

## 2023-07-29 DIAGNOSIS — I712 Thoracic aortic aneurysm, without rupture, unspecified: Secondary | ICD-10-CM

## 2023-07-29 NOTE — Telephone Encounter (Signed)
No ans, no vm

## 2023-07-29 NOTE — Telephone Encounter (Signed)
 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    Last seen by cardiovascular surgery, Dr. Dorise Fellers in 2019 with ascending thoracic aneurysm measuring 4.2 cm at that time, no indication for surgical intervention at that time and overall not a great candidate for any surgical treatment due to her morbid obesity and oxygen  dependent lung disease but did recommend two year follow up.   Would recommend referral to vascular for surveillance.

## 2023-07-30 NOTE — Addendum Note (Signed)
 Addended by: JEANELL GRAEME RAMAN on: 07/30/2023 11:28 AM   Modules accepted: Orders

## 2023-07-30 NOTE — Telephone Encounter (Signed)
 Referral placed.

## 2023-07-30 NOTE — Telephone Encounter (Signed)
 Spoke with caregiver at [facility] and she is ok with referral to Vascular being sent.

## 2023-08-01 ENCOUNTER — Encounter (HOSPITAL_COMMUNITY)
Admission: RE | Admit: 2023-08-01 | Discharge: 2023-08-01 | Disposition: A | Discharge status: Home / Self Care | Source: Ambulatory Visit | Attending: Internal Medicine | Admitting: Internal Medicine

## 2023-08-01 ENCOUNTER — Encounter (HOSPITAL_COMMUNITY): Payer: Self-pay

## 2023-08-01 ENCOUNTER — Other Ambulatory Visit: Payer: Self-pay

## 2023-08-01 DIAGNOSIS — N390 Urinary tract infection, site not specified: Secondary | ICD-10-CM | POA: Diagnosis not present

## 2023-08-01 DIAGNOSIS — I129 Hypertensive chronic kidney disease with stage 1 through stage 4 chronic kidney disease, or unspecified chronic kidney disease: Secondary | ICD-10-CM | POA: Diagnosis not present

## 2023-08-01 DIAGNOSIS — G629 Polyneuropathy, unspecified: Secondary | ICD-10-CM | POA: Diagnosis not present

## 2023-08-01 DIAGNOSIS — E039 Hypothyroidism, unspecified: Secondary | ICD-10-CM | POA: Diagnosis not present

## 2023-08-01 DIAGNOSIS — N3281 Overactive bladder: Secondary | ICD-10-CM | POA: Diagnosis not present

## 2023-08-01 DIAGNOSIS — F319 Bipolar disorder, unspecified: Secondary | ICD-10-CM | POA: Diagnosis not present

## 2023-08-01 DIAGNOSIS — J45909 Unspecified asthma, uncomplicated: Secondary | ICD-10-CM | POA: Diagnosis not present

## 2023-08-01 DIAGNOSIS — F419 Anxiety disorder, unspecified: Secondary | ICD-10-CM | POA: Diagnosis not present

## 2023-08-01 DIAGNOSIS — N182 Chronic kidney disease, stage 2 (mild): Secondary | ICD-10-CM | POA: Diagnosis not present

## 2023-08-05 ENCOUNTER — Telehealth: Payer: Self-pay | Admitting: *Deleted

## 2023-08-05 ENCOUNTER — Encounter (HOSPITAL_COMMUNITY): Admission: RE | Payer: Self-pay | Source: Home / Self Care

## 2023-08-05 ENCOUNTER — Ambulatory Visit (HOSPITAL_COMMUNITY): Admission: RE | Admit: 2023-08-05 | Source: Home / Self Care | Admitting: Internal Medicine

## 2023-08-05 SURGERY — EGD (ESOPHAGOGASTRODUODENOSCOPY)
Anesthesia: Choice

## 2023-08-05 NOTE — Telephone Encounter (Signed)
 Received message from endo, procedure for today needs to be rescheduled  Called home #, no answer and no VM. Line rang numerous times.

## 2023-08-06 NOTE — Telephone Encounter (Signed)
 [facility], line rings busy several times. Tried multiple times

## 2023-08-12 DIAGNOSIS — E039 Hypothyroidism, unspecified: Secondary | ICD-10-CM | POA: Diagnosis not present

## 2023-08-12 DIAGNOSIS — N3281 Overactive bladder: Secondary | ICD-10-CM | POA: Diagnosis not present

## 2023-08-12 DIAGNOSIS — F419 Anxiety disorder, unspecified: Secondary | ICD-10-CM | POA: Diagnosis not present

## 2023-08-12 DIAGNOSIS — G629 Polyneuropathy, unspecified: Secondary | ICD-10-CM | POA: Diagnosis not present

## 2023-08-12 DIAGNOSIS — I129 Hypertensive chronic kidney disease with stage 1 through stage 4 chronic kidney disease, or unspecified chronic kidney disease: Secondary | ICD-10-CM | POA: Diagnosis not present

## 2023-08-12 DIAGNOSIS — F319 Bipolar disorder, unspecified: Secondary | ICD-10-CM | POA: Diagnosis not present

## 2023-08-12 DIAGNOSIS — N390 Urinary tract infection, site not specified: Secondary | ICD-10-CM | POA: Diagnosis not present

## 2023-08-12 DIAGNOSIS — N182 Chronic kidney disease, stage 2 (mild): Secondary | ICD-10-CM | POA: Diagnosis not present

## 2023-08-12 DIAGNOSIS — J45909 Unspecified asthma, uncomplicated: Secondary | ICD-10-CM | POA: Diagnosis not present

## 2023-08-28 ENCOUNTER — Encounter: Payer: Self-pay | Admitting: Surgery

## 2023-08-28 ENCOUNTER — Ambulatory Visit

## 2023-08-28 NOTE — Progress Notes (Deleted)
 [address] [place] [address]             [phone]            Lindsey Rowe [id number] [birth year]   History of Present Illness:      Current Outpatient Medications on File Prior to Visit  Medication Sig Dispense Refill   acetaminophen  (TYLENOL ) 325 MG tablet Take 2 tablets (650 mg total) by mouth every 6 (six) hours as needed for mild pain, moderate pain or headache. 30 tablet 0   acetaminophen  (TYLENOL ) 500 MG tablet Take 1,000 mg by mouth every 6 (six) hours as needed for moderate pain.     albuterol  (PROVENTIL  HFA;VENTOLIN  HFA) 108 (90 Base) MCG/ACT inhaler Inhale 2 puffs into the lungs every 6 (six) hours as needed for wheezing or shortness of breath. 1 Inhaler 0   BANOPHEN  25 MG capsule Take 25 mg by mouth at bedtime.     busPIRone  (BUSPAR ) 30 MG tablet Take 30 mg by mouth 3 (three) times daily.     cycloSPORINE  (RESTASIS ) 0.05 % ophthalmic emulsion Place 1 drop into both eyes 2 (two) times daily.     DULoxetine  (CYMBALTA ) 60 MG capsule Take 60 mg by mouth daily.     lansoprazole  (PREVACID ) 30 MG capsule Take 1 capsule (30 mg total) by mouth daily before breakfast. 30 capsule 11   levothyroxine  (SYNTHROID ) 100 MCG tablet Take 100 mcg by mouth daily.     Multiple Vitamins-Minerals (THERA-M) TABS Take 1 tablet by mouth daily.     pregabalin  (LYRICA ) 100 MG capsule Take 100 mg by mouth 2 (two) times daily.     QUEtiapine (SEROQUEL) 300 MG tablet Take 300 mg by mouth at bedtime.     SUMAtriptan  (IMITREX ) 50 MG tablet Take 50 mg by mouth daily as needed for migraine.     tamsulosin (FLOMAX) 0.4 MG CAPS capsule Take 0.4 mg by mouth daily.     tolterodine (DETROL LA) 4 MG 24 hr capsule Take 4 mg by mouth daily.     traMADol  (ULTRAM ) 50 MG tablet Take 50 mg by mouth 2 (two) times daily as needed.     triamcinolone  cream (KENALOG ) 0.1 % Apply 1 application topically daily.     ZEASORB-AF 2 % powder Apply topically.     Zinc  Sulfate 220 (50  Zn) MG TABS Take 100 mg by mouth daily.     No current facility-administered medications on file prior to visit.     ROS:   There were no vitals taken for this visit.    Imaging:  1. No definite acute findings in the thorax. 2. Aortic atherosclerosis, in addition to left anterior descending coronary artery disease. Aneurysmal dilatation of the ascending thoracic aorta (4.5 cm in diameter), similar to the prior study. Ascending thoracic aortic aneurysm. Recommend semi-annual imaging followup by CTA or MRA and referral to cardiothoracic surgery if not already obtained. This recommendation follows 2010 ACCF/AHA/AATS/ACR/ASA/SCA/SCAI/SIR/STS/SVM Guidelines for the Diagnosis and Management of Patients With Thoracic Aortic Disease. Circulation. 2010; 121: Z733-z630. Aortic aneurysm NOS (ICD10-I71.9). 3. Incidental findings, as above.  Aortic Atherosclerosis (ICD10-I70.0). Aortic aneurysm NOS (ICD10-I71.9).   Electronically Signed   By: Toribio Aye M.D.   On: 02/01/2023 05:34 Narrative  CLINICAL DATA:  78 year old female with history of ascending thoracic aortic aneurysm. Seizure-like activity.  EXAM: CT ANGIOGRAPHY CHEST WITH CONTRAST  TECHNIQUE: Multidetector CT imaging of the chest was performed using  the standard protocol during bolus administration of intravenous contrast. Multiplanar CT image reconstructions and MIPs were obtained to evaluate the vascular anatomy.  RADIATION DOSE REDUCTION: This exam was performed according to the departmental dose-optimization program which includes automated exposure control, adjustment of the mA and/or kV according to patient size and/or use of iterative reconstruction technique.  CONTRAST:  60 mL of Omnipaque  350.  COMPARISON:  Chest CT 12/28/2022.  FINDINGS: Cardiovascular: Heart size is borderline enlarged. There is no significant pericardial fluid, thickening or pericardial calcification. There is aortic  atherosclerosis, as well as atherosclerosis of the great vessels of the mediastinum and the coronary arteries, including calcified atherosclerotic plaque in the left anterior descending coronary arteries. Aneurysmal dilatation of the ascending thoracic aorta (4.5 cm in diameter), similar to the prior study. No definite evidence of thoracic aortic dissection at this time.  Mediastinum/Nodes: No pathologically enlarged mediastinal or hilar lymph nodes. Esophagus is unremarkable in appearance. No axillary lymphadenopathy.  Lungs/Pleura: No acute consolidative airspace disease. No pleural effusions. Dependent areas of mild subsegmental atelectasis in the lower lobes of the lungs bilaterally. No definite suspicious appearing pulmonary nodules or masses are noted. Partial collapse of the trachea and mainstem bronchi, indicative of tracheobronchomalacia.  Upper Abdomen: Aortic atherosclerosis. Surgical clips in the left upper quadrant of the abdomen adjacent to the proximal stomach.  Musculoskeletal: There are no aggressive appearing lytic or blastic lesions noted in the visualized portions of the skeleton.    A/P:      Risk Modification:  Statin:  ***  Smoking cessation instruction/counseling given:  {CHL AMB [facility] SMOKING CESSATION COUNSELING:20758}  Patient was counseled on importance of Blood Pressure Control  They are instructed to contact their Primary Care Physician if they start to have blood pressure readings over 130s/90s. Do not ever stop blood pressure medications on your own, unless instructed by healthcare professional.  Please avoid use of Fluoroquinolones as this can potentially increase your risk of Aortic Rupture and/or Dissection  Patient educated on signs and symptoms of Aortic Dissection, handout also provided in AVS  Manuelita CHRISTELLA Rough, PA-C 08/28/23

## 2023-09-18 ENCOUNTER — Other Ambulatory Visit: Payer: Self-pay | Admitting: Gastroenterology

## 2023-09-24 DIAGNOSIS — I10 Essential (primary) hypertension: Secondary | ICD-10-CM | POA: Diagnosis not present

## 2023-09-24 DIAGNOSIS — Z299 Encounter for prophylactic measures, unspecified: Secondary | ICD-10-CM | POA: Diagnosis not present

## 2023-09-24 DIAGNOSIS — R32 Unspecified urinary incontinence: Secondary | ICD-10-CM | POA: Diagnosis not present

## 2023-09-24 DIAGNOSIS — J9610 Chronic respiratory failure, unspecified whether with hypoxia or hypercapnia: Secondary | ICD-10-CM | POA: Diagnosis not present

## 2023-09-24 DIAGNOSIS — R52 Pain, unspecified: Secondary | ICD-10-CM | POA: Diagnosis not present

## 2023-09-24 DIAGNOSIS — Z79899 Other long term (current) drug therapy: Secondary | ICD-10-CM | POA: Diagnosis not present

## 2023-09-24 DIAGNOSIS — J449 Chronic obstructive pulmonary disease, unspecified: Secondary | ICD-10-CM | POA: Diagnosis not present

## 2023-09-24 DIAGNOSIS — M2550 Pain in unspecified joint: Secondary | ICD-10-CM | POA: Diagnosis not present

## 2023-10-08 DIAGNOSIS — M79674 Pain in right toe(s): Secondary | ICD-10-CM | POA: Diagnosis not present

## 2023-10-08 DIAGNOSIS — B351 Tinea unguium: Secondary | ICD-10-CM | POA: Diagnosis not present

## 2023-10-08 DIAGNOSIS — L84 Corns and callosities: Secondary | ICD-10-CM | POA: Diagnosis not present

## 2023-10-08 DIAGNOSIS — M79675 Pain in left toe(s): Secondary | ICD-10-CM | POA: Diagnosis not present

## 2023-10-08 DIAGNOSIS — I70203 Unspecified atherosclerosis of native arteries of extremities, bilateral legs: Secondary | ICD-10-CM | POA: Diagnosis not present

## 2023-10-10 ENCOUNTER — Encounter: Payer: Self-pay | Admitting: *Deleted

## 2023-10-10 NOTE — Telephone Encounter (Signed)
 Pt has been scheduled for 11/01/23, instructions faxed to facility at # [phone]

## 2023-10-25 DIAGNOSIS — M797 Fibromyalgia: Secondary | ICD-10-CM | POA: Diagnosis not present

## 2023-10-25 DIAGNOSIS — I10 Essential (primary) hypertension: Secondary | ICD-10-CM | POA: Diagnosis not present

## 2023-10-25 DIAGNOSIS — F319 Bipolar disorder, unspecified: Secondary | ICD-10-CM | POA: Diagnosis not present

## 2023-10-25 DIAGNOSIS — Z23 Encounter for immunization: Secondary | ICD-10-CM | POA: Diagnosis not present

## 2023-10-25 DIAGNOSIS — R52 Pain, unspecified: Secondary | ICD-10-CM | POA: Diagnosis not present

## 2023-10-25 DIAGNOSIS — Z299 Encounter for prophylactic measures, unspecified: Secondary | ICD-10-CM | POA: Diagnosis not present

## 2023-10-28 NOTE — Patient Instructions (Signed)
 20    Your procedure is scheduled on: 11/01/2023  Report to [facility] Main Entrance at  12:30   PM.  Call this number if you have problems the morning of surgery: [phone]   Remember:   Follow instructions on letter from office regarding when to stop eating and drinking  You may have CLEAR LIQUIDS water, tea, soft drinks, or BLACK coffee No Milk or CREAMER until 10:30 am        No Smoking the day of procedure      Take these medicines the morning of surgery with A SIP OF WATER:Buspar , Cymbalta , Prevacid , Levothyroxine , Lyrica , Imitrex , Flomax, Tramadol , and Detrol   Do not wear jewelry, make-up or nail polish.  Do not wear lotions, powders, or perfumes. You may wear deodorant.                Do not bring valuables to the hospital.  Contacts, dentures or bridgework may not be worn into surgery.  Leave suitcase in the car. After surgery it may be brought to your room.  For patients admitted to the hospital, checkout time is 11:00 AM the day of discharge.   Patients discharged the day of surgery will not be allowed to drive home. Upper Endoscopy, Adult Upper endoscopy is a procedure to look inside the upper GI (gastrointestinal) tract. The upper GI tract is made up of: The part of the body that moves food from your mouth to your stomach (esophagus). The stomach. The first part of your small intestine (duodenum). This procedure is also called esophagogastroduodenoscopy (EGD) or gastroscopy. In this procedure, your health care provider passes a thin, flexible tube (endoscope) through your mouth and down your esophagus into your stomach. A small camera is attached to the end of the tube. Images from the camera appear on a monitor in the exam room. During this procedure, your health care provider may also remove a small piece of tissue to be sent to a lab and examined under a microscope (biopsy). Your health care provider may do an upper endoscopy to diagnose cancers of the upper GI tract.  You may also have this procedure to find the cause of other conditions, such as: Stomach pain. Heartburn. Pain or problems when swallowing. Nausea and vomiting. Stomach bleeding. Stomach ulcers. Tell a health care provider about: Any allergies you have. All medicines you are taking, including vitamins, herbs, eye drops, creams, and over-the-counter medicines. Any problems you or family members have had with anesthetic medicines. Any blood disorders you have. Any surgeries you have had. Any medical conditions you have. Whether you are pregnant or may be pregnant. What are the risks? Generally, this is a safe procedure. However, problems may occur, including: Infection. Bleeding. Allergic reactions to medicines. A tear or hole (perforation) in the esophagus, stomach, or duodenum. What happens before the procedure? Staying hydrated Follow instructions from your health care provider about hydration, which may include: Up to 4 hours before the procedure - you may continue to drink clear liquids, such as water, clear fruit juice, black coffee, and plain tea.   Medicines Ask your health care provider about: Changing or stopping your regular medicines. This is especially important if you are taking diabetes medicines or blood thinners. Taking medicines such as aspirin  and ibuprofen . These medicines can thin your blood. Do not take these medicines unless your health care provider tells you to take them. Taking over-the-counter medicines, vitamins, herbs, and supplements. General instructions Plan to have someone take you home  from the hospital or clinic. If you will be going home right after the procedure, plan to have someone with you for 24 hours. Ask your health care provider what steps will be taken to help prevent infection. What happens during the procedure?  An IV will be inserted into one of your veins. You may be given one or more of the following: A medicine to help you relax  (sedative). A medicine to numb the throat (local anesthetic). You will lie on your left side on an exam table. Your health care provider will pass the endoscope through your mouth and down your esophagus. Your health care provider will use the scope to check the inside of your esophagus, stomach, and duodenum. Biopsies may be taken. The endoscope will be removed. The procedure may vary among health care providers and hospitals. What happens after the procedure? Your blood pressure, heart rate, breathing rate, and blood oxygen  level will be monitored until you leave the hospital or clinic. Do not drive for 24 hours if you were given a sedative during your procedure. When your throat is no longer numb, you may be given some fluids to drink. It is up to you to get the results of your procedure. Ask your health care provider, or the department that is doing the procedure, when your results will be ready. Summary Upper endoscopy is a procedure to look inside the upper GI tract. During the procedure, an IV will be inserted into one of your veins. You may be given a medicine to help you relax. A medicine will be used to numb your throat. The endoscope will be passed through your mouth and down your esophagus. This information is not intended to replace advice given to you by your health care provider. Make sure you discuss any questions you have with your health care provider. Document Revised: 07/17/2017 Document Reviewed: 06/24/2017 Elsevier Patient Education  2020 [employer]                                                                                                                                      EndoscopyCare After  Please read the instructions outlined below and refer to this sheet in the next few weeks. These discharge instructions provide you with general information on caring for yourself after you leave the hospital. Your doctor may also give you specific instructions. While  your treatment has been planned according to the most current medical practices available, unavoidable complications occasionally occur. If you have any problems or questions after discharge, please call your doctor. HOME CARE INSTRUCTIONS Activity You may resume your regular activity but move at a slower pace for the next 24 hours.  Take frequent rest periods for the next 24 hours.  Walking will help expel (get rid of) the air and reduce the bloated feeling in your abdomen.  No driving for 24 hours (because of the anesthesia (medicine) used during  the test).  You may shower.  Do not sign any important legal documents or operate any machinery for 24 hours (because of the anesthesia used during the test).  Nutrition Drink plenty of fluids.  You may resume your normal diet.  Begin with a light meal and progress to your normal diet.  Avoid alcoholic beverages for 24 hours or as instructed by your caregiver.  Medications You may resume your normal medications unless your caregiver tells you otherwise. What you can expect today You may experience abdominal discomfort such as a feeling of fullness or gas pains.  You may experience a sore throat for 2 to 3 days. This is normal. Gargling with salt water may help this.  Follow-up Your doctor will discuss the results of your test with you. SEEK IMMEDIATE MEDICAL CARE IF: You have excessive nausea (feeling sick to your stomach) and/or vomiting.  You have severe abdominal pain and distention (swelling).  You have trouble swallowing.  You have a temperature over 100 F (37.8 C).  You have rectal bleeding or vomiting of blood.  Document Released: 09/06/2003 Document Revised: 01/11/2011 Document Reviewed: 03/19/2007

## 2023-10-29 ENCOUNTER — Encounter (HOSPITAL_COMMUNITY)
Admission: RE | Admit: 2023-10-29 | Discharge: 2023-10-29 | Disposition: A | Discharge status: Home / Self Care | Source: Ambulatory Visit | Attending: Internal Medicine | Admitting: Internal Medicine

## 2023-10-29 ENCOUNTER — Other Ambulatory Visit: Payer: Self-pay

## 2023-10-29 ENCOUNTER — Encounter (HOSPITAL_COMMUNITY): Payer: Self-pay

## 2023-10-29 VITALS — BP 107/83 | HR 74 | Resp 18 | Ht 63.5 in | Wt 200.0 lb

## 2023-10-29 DIAGNOSIS — Z01810 Encounter for preprocedural cardiovascular examination: Secondary | ICD-10-CM | POA: Diagnosis not present

## 2023-10-29 DIAGNOSIS — I10 Essential (primary) hypertension: Secondary | ICD-10-CM | POA: Insufficient documentation

## 2023-11-01 ENCOUNTER — Ambulatory Visit (HOSPITAL_COMMUNITY): Admitting: Anesthesiology

## 2023-11-01 ENCOUNTER — Encounter (HOSPITAL_COMMUNITY): Payer: Self-pay | Admitting: Internal Medicine

## 2023-11-01 ENCOUNTER — Ambulatory Visit (HOSPITAL_COMMUNITY)
Admission: RE | Admit: 2023-11-01 | Discharge: 2023-11-01 | Disposition: A | Discharge status: Home / Self Care | Attending: Internal Medicine | Admitting: Internal Medicine

## 2023-11-01 ENCOUNTER — Encounter (HOSPITAL_COMMUNITY)
Admission: RE | Disposition: A | Discharge status: Home / Self Care | Payer: Self-pay | Source: Home / Self Care | Attending: Internal Medicine

## 2023-11-01 DIAGNOSIS — Z79899 Other long term (current) drug therapy: Secondary | ICD-10-CM | POA: Insufficient documentation

## 2023-11-01 DIAGNOSIS — R519 Headache, unspecified: Secondary | ICD-10-CM | POA: Insufficient documentation

## 2023-11-01 DIAGNOSIS — F319 Bipolar disorder, unspecified: Secondary | ICD-10-CM | POA: Diagnosis not present

## 2023-11-01 DIAGNOSIS — J45909 Unspecified asthma, uncomplicated: Secondary | ICD-10-CM | POA: Diagnosis not present

## 2023-11-01 DIAGNOSIS — K289 Gastrojejunal ulcer, unspecified as acute or chronic, without hemorrhage or perforation: Secondary | ICD-10-CM | POA: Diagnosis not present

## 2023-11-01 DIAGNOSIS — J449 Chronic obstructive pulmonary disease, unspecified: Secondary | ICD-10-CM

## 2023-11-01 DIAGNOSIS — K3189 Other diseases of stomach and duodenum: Secondary | ICD-10-CM | POA: Diagnosis not present

## 2023-11-01 DIAGNOSIS — I10 Essential (primary) hypertension: Secondary | ICD-10-CM | POA: Insufficient documentation

## 2023-11-01 DIAGNOSIS — K219 Gastro-esophageal reflux disease without esophagitis: Secondary | ICD-10-CM | POA: Insufficient documentation

## 2023-11-01 DIAGNOSIS — Z79890 Hormone replacement therapy (postmenopausal): Secondary | ICD-10-CM | POA: Diagnosis not present

## 2023-11-01 DIAGNOSIS — R1310 Dysphagia, unspecified: Secondary | ICD-10-CM | POA: Diagnosis not present

## 2023-11-01 DIAGNOSIS — E785 Hyperlipidemia, unspecified: Secondary | ICD-10-CM | POA: Diagnosis not present

## 2023-11-01 DIAGNOSIS — K259 Gastric ulcer, unspecified as acute or chronic, without hemorrhage or perforation: Secondary | ICD-10-CM | POA: Diagnosis not present

## 2023-11-01 DIAGNOSIS — R1314 Dysphagia, pharyngoesophageal phase: Secondary | ICD-10-CM | POA: Insufficient documentation

## 2023-11-01 DIAGNOSIS — E039 Hypothyroidism, unspecified: Secondary | ICD-10-CM | POA: Diagnosis not present

## 2023-11-01 DIAGNOSIS — Z9884 Bariatric surgery status: Secondary | ICD-10-CM | POA: Diagnosis not present

## 2023-11-01 HISTORY — PX: ESOPHAGOGASTRODUODENOSCOPY: SHX5428

## 2023-11-01 HISTORY — PX: ESOPHAGEAL DILATION: SHX303

## 2023-11-01 SURGERY — EGD (ESOPHAGOGASTRODUODENOSCOPY)
Anesthesia: General

## 2023-11-01 MED ORDER — LACTATED RINGERS IV SOLN: INTRAVENOUS | Status: DC

## 2023-11-01 MED ORDER — LIDOCAINE 2% (20 MG/ML) 5 ML SYRINGE
INTRAMUSCULAR | Status: DC | PRN
  Administered 2023-11-01: 40 mg via INTRAVENOUS

## 2023-11-01 MED ORDER — PROPOFOL 10 MG/ML IV BOLUS
INTRAVENOUS | Status: DC | PRN
  Administered 2023-11-01: 50 mg via INTRAVENOUS
  Administered 2023-11-01: 125 ug/kg/min via INTRAVENOUS

## 2023-11-01 MED ORDER — PHENYLEPHRINE 80 MCG/ML (10ML) SYRINGE FOR IV PUSH (FOR BLOOD PRESSURE SUPPORT)
PREFILLED_SYRINGE | INTRAVENOUS | Status: DC | PRN
  Administered 2023-11-01 (×2): 80 ug via INTRAVENOUS

## 2023-11-01 NOTE — Transfer of Care (Addendum)
 Immediate Anesthesia Transfer of Care Note  Patient: Lindsey Rowe  Procedure(s) Performed: EGD (ESOPHAGOGASTRODUODENOSCOPY) DILATION, ESOPHAGUS  Patient Location: Short Stay  Anesthesia Type:General  Level of Consciousness: drowsy and patient cooperative  Airway & Oxygen  Therapy: Patient Spontanous Breathing and Patient connected to nasal cannula oxygen   Post-op Assessment: Report given to RN and Post -op Vital signs reviewed and stable  Post vital signs: Reviewed and stable  Last Vitals:  Vitals Value Taken Time  BP 110/72 11/01/23   1405  Temp 36.5 11/01/23   1405  Pulse 80 11/01/23   1405  Resp 20 11/01/23   1405  SpO2 93% 11/01/23   1405    Last Pain:  Vitals:   11/01/23 1341  PainSc: 0-No pain         Complications: No notable events documented.

## 2023-11-01 NOTE — Discharge Instructions (Signed)
 EGD Discharge instructions Please read the instructions outlined below and refer to this sheet in the next few weeks. These discharge instructions provide you with general information on caring for yourself after you leave the hospital. Your doctor may also give you specific instructions. While your treatment has been planned according to the most current medical practices available, unavoidable complications occasionally occur. If you have any problems or questions after discharge, please call your doctor. ACTIVITY You may resume your regular activity but move at a slower pace for the next 24 hours.  Take frequent rest periods for the next 24 hours.  Walking will help expel (get rid of) the air and reduce the bloated feeling in your abdomen.  No driving for 24 hours (because of the anesthesia (medicine) used during the test).  You may shower.  Do not sign any important legal documents or operate any machinery for 24 hours (because of the anesthesia used during the test).  NUTRITION Drink plenty of fluids.  You may resume your normal diet.  Begin with a light meal and progress to your normal diet.  Avoid alcoholic beverages for 24 hours or as instructed by your caregiver.  MEDICATIONS You may resume your normal medications unless your caregiver tells you otherwise.  WHAT YOU CAN EXPECT TODAY You may experience abdominal discomfort such as a feeling of fullness or "gas" pains.  FOLLOW-UP Your doctor will discuss the results of your test with you.  SEEK IMMEDIATE MEDICAL ATTENTION IF ANY OF THE FOLLOWING OCCUR: Excessive nausea (feeling sick to your stomach) and/or vomiting.  Severe abdominal pain and distention (swelling).  Trouble swallowing.  Temperature over 101 F (37.8 C).  Rectal bleeding or vomiting of blood.     Your esophagus appeared normal.  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  Your residual stomach was inflamed  biopsies taken  Further recommendations to follow pending review of pathology report  Office visit with us  in 4 to 6 weeks   at patient request, I called Ty, caregiver-[phone] -  reviewed findings and recommendations.

## 2023-11-01 NOTE — Anesthesia Preprocedure Evaluation (Signed)
 Anesthesia Evaluation  Patient identified by MRN, date of birth, ID band Patient awake    Reviewed: Allergy & Precautions, H&P , NPO status , Patient's Chart, lab work & pertinent test results, reviewed documented beta blocker date and time   History of Anesthesia Complications (+) history of anesthetic complications  Airway Mallampati: II  TM Distance: >3 FB Neck ROM: full    Dental no notable dental hx.    Pulmonary asthma , COPD   Pulmonary exam normal breath sounds clear to auscultation       Cardiovascular Exercise Tolerance: Good hypertension,  Rhythm:regular Rate:Normal     Neuro/Psych  Headaches PSYCHIATRIC DISORDERS  Depression Bipolar Disorder    Neuromuscular disease    GI/Hepatic Neg liver ROS,GERD  ,,  Endo/Other  Hypothyroidism    Renal/GU Renal disease  negative genitourinary   Musculoskeletal   Abdominal   Peds  Hematology negative hematology ROS (+)   Anesthesia Other Findings   Reproductive/Obstetrics negative OB ROS                              Anesthesia Physical Anesthesia Plan  ASA: 3  Anesthesia Plan: General   Post-op Pain Management:    Induction:   PONV Risk Score and Plan: Propofol  infusion  Airway Management Planned:   Additional Equipment:   Intra-op Plan:   Post-operative Plan:   Informed Consent: I have reviewed the patients History and Physical, chart, labs and discussed the procedure including the risks, benefits and alternatives for the proposed anesthesia with the patient or authorized representative who has indicated his/her understanding and acceptance.     Dental Advisory Given  Plan Discussed with: CRNA  Anesthesia Plan Comments:         Anesthesia Quick Evaluation

## 2023-11-01 NOTE — Op Note (Signed)
 [facility] Patient Name: Lindsey Rowe Procedure Date: 11/01/2023 1:19 PM MRN: [id number] Date of Birth: [birth year] Attending MD: Lamar Ozell Hollingshead , MD, [id number] CSN: [id number] Age: 78 Admit Type: Outpatient Procedure:                Upper GI endoscopy Indications:              Dysphagia Providers:                Lamar Ozell Hollingshead, MD, Devere Lodge, Bascom Blush Referring MD:              Medicines:                Propofol  per Anesthesia Complications:            No immediate complications. Estimated Blood Loss:     Estimated blood loss was minimal. Procedure:                Pre-Anesthesia Assessment:                           - Prior to the procedure, a History and Physical                            was performed, and patient medications and                            allergies were reviewed. The patient's tolerance of                            previous anesthesia was also reviewed. The risks                            and benefits of the procedure and the sedation                            options and risks were discussed with the patient.                            All questions were answered, and informed consent                            was obtained. Prior Anticoagulants: The patient has                            taken no anticoagulant or antiplatelet agents. ASA                            Grade Assessment: III - A patient with severe                            systemic disease. After reviewing the risks and                            benefits, the patient was deemed in satisfactory  condition to undergo the procedure.                           After obtaining informed consent, the endoscope was                            passed under direct vision. Throughout the                            procedure, the patient's blood pressure, pulse, and                            oxygen  saturations were monitored continuously. The                             [id number] ([id number]) Upper was introduced through                            the mouth, and advanced to the jejunum. The upper                            GI endoscopy was accomplished without difficulty.                            The patient tolerated the procedure well. Scope In: 1:49:26 PM Scope Out: 1:56:44 PM Total Procedure Duration: 0 hours 7 minutes 18 seconds  Findings:      The examined esophagus was normal. Surgically altered stomach with       proximal gastric pouch with erosions staple line and sutures protruding.       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 Anastomosis with       apparent jejunal limb identified and intubated several centimeters this       segment of the GI tract appeared normal. The scope was withdrawn.       Dilation was performed with a Maloney dilator with mild resistance at 54       Fr. Dilation was performed with a Maloney dilator with mild resistance       at 56 Fr. The dilation site was examined following endoscope reinsertion       and showed no change. Estimated blood loss: none. Finally, biopsies of       the eroded gastric mucosa taken for histologic study Impression:               - Normal esophagus. Dilated. 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 The following parameters were monitored: oxygen        saturation, heart rate, blood pressure, respiratory rate, EKG, adequacy       of pulmonary ventilation, and response to care. Recommendation:           -  Patient has a contact number available for                            emergencies. The signs and symptoms of potential                            delayed complications were discussed with the                            patient. Return to  normal activities tomorrow.                            Written discharge instructions were provided to the                            patient.                           - Advance diet as tolerated. Follow-up on                            pathology. Office visit with us  in 4 to 6 weeks Procedure Code(s):        --- Professional ---                           [phone], Esophagogastroduodenoscopy, flexible,                            transoral; diagnostic, including collection of                            specimen(s) by brushing or washing, when performed                            (separate procedure)                           43450, Dilation of esophagus, by unguided sound or                            bougie, single or multiple passes Diagnosis Code(s):        --- Professional ---                           R13.10, Dysphagia, unspecified CPT copyright 2022 [employer]. All rights reserved. The codes documented in this report are preliminary and upon coder review may  be revised to meet current compliance requirements. Lamar HERO. Koreena Joost, MD Lamar Ozell Hollingshead, MD 11/01/2023 2:12:44 PM This report has been signed electronically. Number of Addenda: 0

## 2023-11-01 NOTE — H&P (Signed)
 @LOGO @   Gastroenterology Progress Note    Primary Care Physician:  Maree Isles, MD Primary Gastroenterologist:  Dr. Shaaron  Pre-Procedure History & Physical: HPI:  Lindsey Rowe is a 78 y.o. female here for  further evaluation management of esophageal dysphagia via EGD with esophageal dilation as feasible and appropriate.    Multiple comorbidities.  Known ascending aneurysmal dilation of the aorta.  He is in size over time.  No prior EGD.  She is not anticoagulated.  Past Medical History:  Diagnosis Date   Arthritis    Back pain    Bipolar 1 disorder (HCC)    Complication of anesthesia    hard to wake up anesthesia   Depression    GERD (gastroesophageal reflux disease)    Headache(784.0)    migraines and tension headaches   Hyperlipidemia    Hypertension    Hypothyroidism    Thoracic ascending aortic aneurysm    Thyroid  disease     Past Surgical History:  Procedure Laterality Date   APPENDECTOMY     BACK SURGERY     BRAIN SURGERY  06/2019   CRANIOTOMY Right 07/12/2019   Procedure: Solmon Dakin for HEMATOMA EVACUATION SUBDURAL;  Surgeon: Cheryle Debby LABOR, MD;  Location: [facility] OR;  Service: Neurosurgery;  Laterality: Right;   EYE SURGERY Bilateral    lens implant   GASTRIC BYPASS     LOOP RECORDER INSERTION N/A 05/25/2016   Procedure: Loop Recorder Insertion;  Surgeon: Will Gladis Norton, MD;  Location: [facility] INVASIVE CV LAB;  Service: Cardiovascular;  Laterality: N/A;   LUMBAR LAMINECTOMY/DECOMPRESSION MICRODISCECTOMY Right 07/23/2012   Procedure: LUMBAR LAMINECTOMY/DECOMPRESSION MICRODISCECTOMY 2 LEVELS;  Surgeon: Catalina CHRISTELLA Stains, MD;  Location: [facility] NEURO ORS;  Service: Neurosurgery;  Laterality: Right;  Right Lumbar three-four  lumbar four-five Laminectomy/Foraminotomy   NASAL SINUS SURGERY Bilateral    TONSILLECTOMY     WRIST SURGERY      Prior to Admission medications   Medication Sig Start Date End Date Taking? Authorizing Provider  BANOPHEN  25 MG capsule Take 25 mg  by mouth at bedtime. 05/16/22  Yes [provider]  busPIRone  (BUSPAR ) 30 MG tablet Take 30 mg by mouth 3 (three) times daily. 06/04/20  Yes [provider]  cycloSPORINE  (RESTASIS ) 0.05 % ophthalmic emulsion Place 1 drop into both eyes 2 (two) times daily.   Yes [provider]  DULoxetine  (CYMBALTA ) 60 MG capsule Take 60 mg by mouth daily.   Yes [provider]  lansoprazole  (PREVACID ) 30 MG capsule TAKE (1) CAPSULE BY MOUTH ONCE DAILY BEFORE BREAKFAST. 09/19/23  Yes Ezzard Sonny RAMAN, PA-C  levothyroxine  (SYNTHROID ) 100 MCG tablet Take 100 mcg by mouth daily. 07/19/23  Yes [provider]  Multiple Vitamins-Minerals (THERA-M) TABS Take 1 tablet by mouth daily. 09/15/21  Yes [provider]  pregabalin  (LYRICA ) 100 MG capsule Take 100 mg by mouth 2 (two) times daily. 06/02/20  Yes [provider]  QUEtiapine (SEROQUEL) 300 MG tablet Take 300 mg by mouth at bedtime. 09/15/21  Yes [provider]  tamsulosin (FLOMAX) 0.4 MG CAPS capsule Take 0.4 mg by mouth daily.   Yes [provider]  tolterodine (DETROL LA) 4 MG 24 hr capsule Take 4 mg by mouth daily. 07/19/23  Yes [provider]  traMADol  (ULTRAM ) 50 MG tablet Take 50 mg by mouth 2 (two) times daily as needed. 08/30/21  Yes [provider]  triamcinolone  cream (KENALOG ) 0.1 % Apply 1 application topically daily. 06/01/20  Yes [provider]  ZEASORB-AF 2 % powder Apply topically. 05/28/22  Yes [provider]  Zinc  Sulfate 220 (50 Zn) MG TABS Take 100 mg by mouth daily. 09/15/21  Yes [provider]  acetaminophen  (TYLENOL ) 325 MG tablet Take 2 tablets (650 mg total) by mouth every 6 (six) hours as needed for mild pain, moderate pain or headache. 05/02/20   Angelena Smalls, MD  acetaminophen  (TYLENOL ) 500 MG tablet Take 1,000 mg by mouth every 6 (six) hours as needed for moderate pain.    [provider]  albuterol  (PROVENTIL   HFA;VENTOLIN  HFA) 108 (90 Base) MCG/ACT inhaler Inhale 2 puffs into the lungs every 6 (six) hours as needed for wheezing or shortness of breath. 04/07/18   Patt Alm Macho, MD  SUMAtriptan  (IMITREX ) 50 MG tablet Take 50 mg by mouth daily as needed for migraine. 06/01/20   [provider]    Allergies as of 10/10/2023 - Review Complete 08/01/2023  Allergen Reaction Noted   Gabapentin Other (See Comments) 05/12/2007   Cefadroxil Itching 05/12/2007   Oxycodone  Other (See Comments) 07/15/2019    Family History  Problem Relation Age of Onset   Heart disease Mother    Emphysema Father    Diabetes Sister    Other Brother        Brain tumor - s/p excision   Colon cancer Neg Hx     Social History   Socioeconomic History   Marital status: Widowed    Spouse name: Not on file   Number of children: 0   Years of education: 12   Highest education level: High school graduate  Occupational History   Occupation: unemployed  Tobacco Use   Smoking status: Never   Smokeless tobacco: Never  Vaping Use   Vaping status: Never Used  Substance and Sexual Activity   Alcohol  use: Not Currently    Comment: social   Drug use: No    Comment: Pt + opioids as prescribed   Sexual activity: Not Currently  Other Topics Concern   Not on file  Social History Narrative   Assisted Living- [facility] [phone]   Social Drivers of Health   Financial Resource Strain: Low Risk  (06/10/2023)   Received from [facility]   Overall Financial Resource Strain (CARDIA)    Difficulty of Paying Living Expenses: Not hard at all  Food Insecurity: No Food Insecurity (06/10/2023)   [facility] from [facility]   Hunger Vital Sign    Within the past 12 months, you worried that your food would run out before you got the money to buy more.: Never true    Within the past 12 months, the food you bought just didn't last and you didn't have money to get more.: Never true  Transportation Needs: No  Transportation Needs (06/10/2023)   Received from [facility] - Transportation    Lack of Transportation (Medical): No    Lack of Transportation (Non-Medical): No  Physical Activity: Inactive (06/09/2023)   Received from [facility]   Exercise Vital Sign    On average, how many days per week do you engage in moderate to strenuous exercise (like a brisk walk)?: 0 days    On average, how many minutes do you engage in exercise at this level?: 0 min  Stress: No Stress Concern Present (06/09/2023)   Received from [facility] - Occupational Stress Questionnaire    Feeling of Stress :  Not at all  Social Connections: Unknown (06/09/2023)   Received from [facility]   Social Connection and Isolation Panel    Frequency of Communication with Friends and Family: Not on file    How often do you get together with friends or relatives?: Once a week    How often do you attend church or religious services?: Never    Do you belong to any clubs or organizations such as church groups, unions, fraternal or athletic groups, or school groups?: No    How often do you attend meetings of the clubs or organizations you belong to?: Never    Are you married, widowed, divorced, separated, never married, or living with a partner?: Widowed  Intimate Partner Violence: Not At Risk (06/10/2023)   Received from [facility]   Humiliation, Afraid, Rape, and Kick questionnaire    Within the last year, have you been afraid of your partner or ex-partner?: No    Within the last year, have you been humiliated or emotionally abused in other ways by your partner or ex-partner?: No    Within the last year, have you been kicked, hit, slapped, or otherwise physically hurt by your partner or ex-partner?: No    Within the last year, have you been raped or forced to have any kind of sexual activity by your partner or ex-partner?: No    Review of Systems   See HPI,  otherwise negative ROS  Physical Exam: BP 107/78 (BP Location: Right Arm)   Temp 98.4 F (36.9 C)   Resp 19   SpO2 96%  General:   Alert,  Well-developed, well-nourished, pleasant and cooperative in NAD athy. Lungs:  Clear throughout to auscultation.   No wheezes, crackles, or rhonchi. No acute distress. Heart:  Regular rate and rhythm; no murmurs, clicks, rubs,  or gallops. Abdomen: Non-distended, normal bowel sounds.  Soft and nontender without appreciable mass or hepatosplenomegaly.    Impression/Plan:     78 year old lady with chronic GERD and esophageal dysphagia.  I have offered the patient EGD with esophageal dilation is feasible/appropriate per plan.  The risks, benefits, limitations, alternatives and imponderables have been reviewed with the patient. Potential for esophageal dilation, biopsy, etc. have also been reviewed.  Questions have been answered. All parties agreeable.     Notice: This dictation was prepared with Dragon dictation along with smaller phrase technology. Any transcriptional errors that result from this process are unintentional and may not be corrected upon review.

## 2023-11-01 NOTE — Anesthesia Postprocedure Evaluation (Signed)
 Anesthesia Post Note  Patient: Lindsey Rowe  Procedure(s) Performed: EGD (ESOPHAGOGASTRODUODENOSCOPY) DILATION, ESOPHAGUS  Patient location during evaluation: Phase II Anesthesia Type: General Level of consciousness: awake Pain management: pain level controlled Vital Signs Assessment: post-procedure vital signs reviewed and stable Respiratory status: spontaneous breathing and respiratory function stable Cardiovascular status: blood pressure returned to baseline and stable Postop Assessment: no headache and no apparent nausea or vomiting Anesthetic complications: no Comments: Late entry   No notable events documented.   Last Vitals:  Vitals:   11/01/23 1323 11/01/23 1405  BP:  110/72  Pulse:  81  Resp:  20  Temp:  36.5 C  SpO2: 96% 93%    Last Pain:  Vitals:   11/01/23 1405  TempSrc: Axillary  PainSc: 0-No pain                 Yvonna JINNY Bosworth

## 2023-11-04 ENCOUNTER — Encounter (HOSPITAL_COMMUNITY): Payer: Self-pay | Admitting: Internal Medicine

## 2023-11-05 LAB — SURGICAL PATHOLOGY

## 2023-11-08 ENCOUNTER — Ambulatory Visit: Payer: Self-pay | Admitting: Internal Medicine

## 2023-12-01 DIAGNOSIS — Z79890 Hormone replacement therapy (postmenopausal): Secondary | ICD-10-CM | POA: Diagnosis not present

## 2023-12-01 DIAGNOSIS — Z66 Do not resuscitate: Secondary | ICD-10-CM | POA: Diagnosis not present

## 2023-12-01 DIAGNOSIS — I77810 Thoracic aortic ectasia: Secondary | ICD-10-CM | POA: Diagnosis not present

## 2023-12-01 DIAGNOSIS — M25532 Pain in left wrist: Secondary | ICD-10-CM | POA: Diagnosis not present

## 2023-12-01 DIAGNOSIS — W19XXXA Unspecified fall, initial encounter: Secondary | ICD-10-CM | POA: Diagnosis not present

## 2023-12-01 DIAGNOSIS — S65901A Unspecified injury of unspecified blood vessel at wrist and hand level of right arm, initial encounter: Secondary | ICD-10-CM | POA: Diagnosis not present

## 2023-12-01 DIAGNOSIS — E039 Hypothyroidism, unspecified: Secondary | ICD-10-CM | POA: Diagnosis not present

## 2023-12-01 DIAGNOSIS — I2510 Atherosclerotic heart disease of native coronary artery without angina pectoris: Secondary | ICD-10-CM | POA: Diagnosis not present

## 2023-12-01 DIAGNOSIS — R54 Age-related physical debility: Secondary | ICD-10-CM | POA: Diagnosis not present

## 2023-12-01 DIAGNOSIS — R404 Transient alteration of awareness: Secondary | ICD-10-CM | POA: Diagnosis not present

## 2023-12-01 DIAGNOSIS — Z7952 Long term (current) use of systemic steroids: Secondary | ICD-10-CM | POA: Diagnosis not present

## 2023-12-01 DIAGNOSIS — R509 Fever, unspecified: Secondary | ICD-10-CM | POA: Diagnosis not present

## 2023-12-01 DIAGNOSIS — F319 Bipolar disorder, unspecified: Secondary | ICD-10-CM | POA: Diagnosis not present

## 2023-12-01 DIAGNOSIS — R0600 Dyspnea, unspecified: Secondary | ICD-10-CM | POA: Diagnosis not present

## 2023-12-01 DIAGNOSIS — Z79899 Other long term (current) drug therapy: Secondary | ICD-10-CM | POA: Diagnosis not present

## 2023-12-01 DIAGNOSIS — Z1624 Resistance to multiple antibiotics: Secondary | ICD-10-CM | POA: Diagnosis not present

## 2023-12-01 DIAGNOSIS — Z9981 Dependence on supplemental oxygen: Secondary | ICD-10-CM | POA: Diagnosis not present

## 2023-12-01 DIAGNOSIS — I712 Thoracic aortic aneurysm, without rupture, unspecified: Secondary | ICD-10-CM | POA: Diagnosis not present

## 2023-12-01 DIAGNOSIS — R531 Weakness: Secondary | ICD-10-CM | POA: Diagnosis not present

## 2023-12-01 DIAGNOSIS — I711 Thoracic aortic aneurysm, ruptured, unspecified: Secondary | ICD-10-CM | POA: Diagnosis not present

## 2023-12-01 DIAGNOSIS — S6982XA Other specified injuries of left wrist, hand and finger(s), initial encounter: Secondary | ICD-10-CM | POA: Diagnosis not present

## 2023-12-01 DIAGNOSIS — Z792 Long term (current) use of antibiotics: Secondary | ICD-10-CM | POA: Diagnosis not present

## 2023-12-01 DIAGNOSIS — N390 Urinary tract infection, site not specified: Secondary | ICD-10-CM | POA: Diagnosis not present

## 2023-12-01 DIAGNOSIS — J45909 Unspecified asthma, uncomplicated: Secondary | ICD-10-CM | POA: Diagnosis not present

## 2023-12-01 DIAGNOSIS — B9620 Unspecified Escherichia coli [E. coli] as the cause of diseases classified elsewhere: Secondary | ICD-10-CM | POA: Diagnosis not present

## 2023-12-01 DIAGNOSIS — S6992XA Unspecified injury of left wrist, hand and finger(s), initial encounter: Secondary | ICD-10-CM | POA: Diagnosis not present

## 2023-12-01 DIAGNOSIS — R0902 Hypoxemia: Secondary | ICD-10-CM | POA: Diagnosis not present

## 2023-12-01 DIAGNOSIS — R5383 Other fatigue: Secondary | ICD-10-CM | POA: Diagnosis not present

## 2023-12-01 DIAGNOSIS — J9601 Acute respiratory failure with hypoxia: Secondary | ICD-10-CM | POA: Diagnosis not present

## 2023-12-01 DIAGNOSIS — W010XXA Fall on same level from slipping, tripping and stumbling without subsequent striking against object, initial encounter: Secondary | ICD-10-CM | POA: Diagnosis not present

## 2023-12-01 DIAGNOSIS — N3000 Acute cystitis without hematuria: Secondary | ICD-10-CM | POA: Diagnosis not present

## 2023-12-07 DIAGNOSIS — I7121 Aneurysm of the ascending aorta, without rupture: Secondary | ICD-10-CM | POA: Diagnosis not present

## 2023-12-07 DIAGNOSIS — N189 Chronic kidney disease, unspecified: Secondary | ICD-10-CM | POA: Diagnosis not present

## 2023-12-07 DIAGNOSIS — F319 Bipolar disorder, unspecified: Secondary | ICD-10-CM | POA: Diagnosis not present

## 2023-12-07 DIAGNOSIS — J45909 Unspecified asthma, uncomplicated: Secondary | ICD-10-CM | POA: Diagnosis not present

## 2023-12-07 DIAGNOSIS — M4800 Spinal stenosis, site unspecified: Secondary | ICD-10-CM | POA: Diagnosis not present

## 2023-12-07 DIAGNOSIS — J9601 Acute respiratory failure with hypoxia: Secondary | ICD-10-CM | POA: Diagnosis not present

## 2023-12-07 DIAGNOSIS — I129 Hypertensive chronic kidney disease with stage 1 through stage 4 chronic kidney disease, or unspecified chronic kidney disease: Secondary | ICD-10-CM | POA: Diagnosis not present

## 2023-12-07 DIAGNOSIS — G629 Polyneuropathy, unspecified: Secondary | ICD-10-CM | POA: Diagnosis not present

## 2023-12-07 DIAGNOSIS — F419 Anxiety disorder, unspecified: Secondary | ICD-10-CM | POA: Diagnosis not present

## 2023-12-11 DIAGNOSIS — I7121 Aneurysm of the ascending aorta, without rupture: Secondary | ICD-10-CM | POA: Diagnosis not present

## 2023-12-11 DIAGNOSIS — J9601 Acute respiratory failure with hypoxia: Secondary | ICD-10-CM | POA: Diagnosis not present

## 2023-12-11 DIAGNOSIS — M4800 Spinal stenosis, site unspecified: Secondary | ICD-10-CM | POA: Diagnosis not present

## 2023-12-11 DIAGNOSIS — F319 Bipolar disorder, unspecified: Secondary | ICD-10-CM | POA: Diagnosis not present

## 2023-12-11 DIAGNOSIS — G629 Polyneuropathy, unspecified: Secondary | ICD-10-CM | POA: Diagnosis not present

## 2023-12-11 DIAGNOSIS — J45909 Unspecified asthma, uncomplicated: Secondary | ICD-10-CM | POA: Diagnosis not present

## 2023-12-11 DIAGNOSIS — F419 Anxiety disorder, unspecified: Secondary | ICD-10-CM | POA: Diagnosis not present

## 2023-12-11 DIAGNOSIS — I129 Hypertensive chronic kidney disease with stage 1 through stage 4 chronic kidney disease, or unspecified chronic kidney disease: Secondary | ICD-10-CM | POA: Diagnosis not present

## 2023-12-12 DIAGNOSIS — I10 Essential (primary) hypertension: Secondary | ICD-10-CM | POA: Diagnosis not present

## 2023-12-12 DIAGNOSIS — F419 Anxiety disorder, unspecified: Secondary | ICD-10-CM | POA: Diagnosis not present

## 2023-12-12 DIAGNOSIS — J9601 Acute respiratory failure with hypoxia: Secondary | ICD-10-CM | POA: Diagnosis not present

## 2023-12-12 DIAGNOSIS — J9611 Chronic respiratory failure with hypoxia: Secondary | ICD-10-CM | POA: Diagnosis not present

## 2023-12-12 DIAGNOSIS — G629 Polyneuropathy, unspecified: Secondary | ICD-10-CM | POA: Diagnosis not present

## 2023-12-12 DIAGNOSIS — J449 Chronic obstructive pulmonary disease, unspecified: Secondary | ICD-10-CM | POA: Diagnosis not present

## 2023-12-12 DIAGNOSIS — F319 Bipolar disorder, unspecified: Secondary | ICD-10-CM | POA: Diagnosis not present

## 2023-12-12 DIAGNOSIS — M4800 Spinal stenosis, site unspecified: Secondary | ICD-10-CM | POA: Diagnosis not present

## 2023-12-12 DIAGNOSIS — M797 Fibromyalgia: Secondary | ICD-10-CM | POA: Diagnosis not present

## 2023-12-12 DIAGNOSIS — I7121 Aneurysm of the ascending aorta, without rupture: Secondary | ICD-10-CM | POA: Diagnosis not present

## 2023-12-12 DIAGNOSIS — R52 Pain, unspecified: Secondary | ICD-10-CM | POA: Diagnosis not present

## 2023-12-12 DIAGNOSIS — N189 Chronic kidney disease, unspecified: Secondary | ICD-10-CM | POA: Diagnosis not present

## 2023-12-12 DIAGNOSIS — R0902 Hypoxemia: Secondary | ICD-10-CM | POA: Diagnosis not present

## 2023-12-12 DIAGNOSIS — J45909 Unspecified asthma, uncomplicated: Secondary | ICD-10-CM | POA: Diagnosis not present

## 2023-12-12 DIAGNOSIS — Z299 Encounter for prophylactic measures, unspecified: Secondary | ICD-10-CM | POA: Diagnosis not present

## 2023-12-12 DIAGNOSIS — I129 Hypertensive chronic kidney disease with stage 1 through stage 4 chronic kidney disease, or unspecified chronic kidney disease: Secondary | ICD-10-CM | POA: Diagnosis not present

## 2023-12-16 DIAGNOSIS — N189 Chronic kidney disease, unspecified: Secondary | ICD-10-CM | POA: Diagnosis not present

## 2023-12-16 DIAGNOSIS — F319 Bipolar disorder, unspecified: Secondary | ICD-10-CM | POA: Diagnosis not present

## 2023-12-16 DIAGNOSIS — F419 Anxiety disorder, unspecified: Secondary | ICD-10-CM | POA: Diagnosis not present

## 2023-12-23 DIAGNOSIS — M25531 Pain in right wrist: Secondary | ICD-10-CM | POA: Diagnosis not present

## 2023-12-24 DIAGNOSIS — J45909 Unspecified asthma, uncomplicated: Secondary | ICD-10-CM | POA: Diagnosis not present

## 2023-12-24 DIAGNOSIS — B351 Tinea unguium: Secondary | ICD-10-CM | POA: Diagnosis not present

## 2023-12-24 DIAGNOSIS — J9601 Acute respiratory failure with hypoxia: Secondary | ICD-10-CM | POA: Diagnosis not present

## 2023-12-24 DIAGNOSIS — I129 Hypertensive chronic kidney disease with stage 1 through stage 4 chronic kidney disease, or unspecified chronic kidney disease: Secondary | ICD-10-CM | POA: Diagnosis not present

## 2023-12-24 DIAGNOSIS — N189 Chronic kidney disease, unspecified: Secondary | ICD-10-CM | POA: Diagnosis not present

## 2023-12-24 DIAGNOSIS — M4800 Spinal stenosis, site unspecified: Secondary | ICD-10-CM | POA: Diagnosis not present

## 2023-12-24 DIAGNOSIS — G629 Polyneuropathy, unspecified: Secondary | ICD-10-CM | POA: Diagnosis not present

## 2023-12-24 DIAGNOSIS — I70203 Unspecified atherosclerosis of native arteries of extremities, bilateral legs: Secondary | ICD-10-CM | POA: Diagnosis not present

## 2023-12-24 DIAGNOSIS — M79675 Pain in left toe(s): Secondary | ICD-10-CM | POA: Diagnosis not present

## 2023-12-24 DIAGNOSIS — F419 Anxiety disorder, unspecified: Secondary | ICD-10-CM | POA: Diagnosis not present

## 2023-12-24 DIAGNOSIS — L84 Corns and callosities: Secondary | ICD-10-CM | POA: Diagnosis not present

## 2023-12-24 DIAGNOSIS — F319 Bipolar disorder, unspecified: Secondary | ICD-10-CM | POA: Diagnosis not present

## 2023-12-24 DIAGNOSIS — M79674 Pain in right toe(s): Secondary | ICD-10-CM | POA: Diagnosis not present

## 2023-12-24 DIAGNOSIS — I7121 Aneurysm of the ascending aorta, without rupture: Secondary | ICD-10-CM | POA: Diagnosis not present

## 2023-12-31 DIAGNOSIS — F319 Bipolar disorder, unspecified: Secondary | ICD-10-CM | POA: Diagnosis not present

## 2024-01-01 DIAGNOSIS — Z299 Encounter for prophylactic measures, unspecified: Secondary | ICD-10-CM | POA: Diagnosis not present

## 2024-01-01 DIAGNOSIS — I10 Essential (primary) hypertension: Secondary | ICD-10-CM | POA: Diagnosis not present

## 2024-01-01 DIAGNOSIS — R5383 Other fatigue: Secondary | ICD-10-CM | POA: Diagnosis not present

## 2024-01-01 DIAGNOSIS — R52 Pain, unspecified: Secondary | ICD-10-CM | POA: Diagnosis not present

## 2024-01-01 DIAGNOSIS — Z0000 Encounter for general adult medical examination without abnormal findings: Secondary | ICD-10-CM | POA: Diagnosis not present

## 2024-01-01 DIAGNOSIS — E039 Hypothyroidism, unspecified: Secondary | ICD-10-CM | POA: Diagnosis not present

## 2024-01-01 DIAGNOSIS — E7800 Pure hypercholesterolemia, unspecified: Secondary | ICD-10-CM | POA: Diagnosis not present

## 2024-03-03 ENCOUNTER — Telehealth: Payer: Self-pay

## 2024-03-03 NOTE — Telephone Encounter (Signed)
 Clearance form needing to be filled out and signed and returned back to the facility is on your desk. Pt is needing clearance from GI for esophageal stricture. Pt had an endoscopy done on 11/01/2023.
